# Patient Record
Sex: Male | Born: 1950
Health system: Southern US, Community
[De-identification: ages and names within clinical notes are randomized; demographics above are authoritative.]

## PROBLEM LIST (undated history)

## (undated) DIAGNOSIS — I2510 Atherosclerotic heart disease of native coronary artery without angina pectoris: Secondary | ICD-10-CM

## (undated) DIAGNOSIS — Z95810 Presence of automatic (implantable) cardiac defibrillator: Secondary | ICD-10-CM

## (undated) DIAGNOSIS — I3139 Other pericardial effusion (noninflammatory): Secondary | ICD-10-CM

## (undated) DIAGNOSIS — J189 Pneumonia, unspecified organism: Secondary | ICD-10-CM

## (undated) DIAGNOSIS — I4901 Ventricular fibrillation: Secondary | ICD-10-CM

## (undated) DIAGNOSIS — R0600 Dyspnea, unspecified: Secondary | ICD-10-CM

## (undated) DIAGNOSIS — N529 Male erectile dysfunction, unspecified: Secondary | ICD-10-CM

## (undated) DIAGNOSIS — Z720 Tobacco use: Secondary | ICD-10-CM

## (undated) DIAGNOSIS — I10 Essential (primary) hypertension: Secondary | ICD-10-CM

## (undated) DIAGNOSIS — I509 Heart failure, unspecified: Secondary | ICD-10-CM

## (undated) DIAGNOSIS — I472 Ventricular tachycardia: Secondary | ICD-10-CM

## (undated) DIAGNOSIS — G4730 Sleep apnea, unspecified: Secondary | ICD-10-CM

## (undated) DIAGNOSIS — I4510 Unspecified right bundle-branch block: Secondary | ICD-10-CM

## (undated) DIAGNOSIS — R7989 Other specified abnormal findings of blood chemistry: Secondary | ICD-10-CM

## (undated) DIAGNOSIS — I213 ST elevation (STEMI) myocardial infarction of unspecified site: Secondary | ICD-10-CM

## (undated) DIAGNOSIS — I241 Dressler's syndrome: Secondary | ICD-10-CM

## (undated) DIAGNOSIS — I255 Ischemic cardiomyopathy: Secondary | ICD-10-CM

## (undated) DIAGNOSIS — E785 Hyperlipidemia, unspecified: Secondary | ICD-10-CM

## (undated) DIAGNOSIS — I313 Pericardial effusion (noninflammatory): Secondary | ICD-10-CM

## (undated) DIAGNOSIS — I319 Disease of pericardium, unspecified: Secondary | ICD-10-CM

## (undated) HISTORY — DX: Dressler's syndrome: I24.1

## (undated) HISTORY — DX: Other pericardial effusion (noninflammatory): I31.39

## (undated) HISTORY — DX: Pericardial effusion (noninflammatory): I31.3

## (undated) HISTORY — PX: HAND SURGERY: SHX662

## (undated) HISTORY — DX: Male erectile dysfunction, unspecified: N52.9

## (undated) HISTORY — DX: Disease of pericardium, unspecified: I31.9

---

## 1980-12-08 HISTORY — PX: KNEE ARTHROSCOPY: SHX127

## 2001-06-03 ENCOUNTER — Other Ambulatory Visit: Admission: RE | Admit: 2001-06-03 | Discharge: 2001-06-03 | Payer: Self-pay | Admitting: Urology

## 2001-06-28 ENCOUNTER — Encounter: Admission: RE | Admit: 2001-06-28 | Discharge: 2001-09-26 | Payer: Self-pay | Admitting: Family Medicine

## 2014-11-10 DIAGNOSIS — I4901 Ventricular fibrillation: Secondary | ICD-10-CM

## 2014-11-10 DIAGNOSIS — I213 ST elevation (STEMI) myocardial infarction of unspecified site: Secondary | ICD-10-CM

## 2014-11-10 DIAGNOSIS — I2102 ST elevation (STEMI) myocardial infarction involving left anterior descending coronary artery: Principal | ICD-10-CM

## 2014-11-10 HISTORY — DX: ST elevation (STEMI) myocardial infarction of unspecified site: I21.3

## 2014-11-10 HISTORY — DX: Ventricular fibrillation: I49.01

## 2014-11-10 HISTORY — PX: CORONARY ANGIOPLASTY WITH STENT PLACEMENT: SHX49

## 2014-11-10 NOTE — ED Notes (Signed)
Wife took rings off of patients hands in prep for catheterization lab, also given clothing.

## 2014-11-10 NOTE — ED Provider Notes (Signed)
HPI Comments: [facility] is a 63 y.o. male who presents via EMS to [facility] c/o 6/10 mid-sternal CP x30-45 min PTA that he describes as a tightness. He states the pain has been gradually improving in the ED and notes the pain "felt like indigestion". He reports additional sxs of SOB, nausea, and diaphoresis. EMS reports the pt became unresponsive en route to ED after which he was shocked once into sinus bradycardia in the 20's and then paced, regaining consciousness. EMS reports they were unable to perform an EKG due to equipment failure. STEMI was called upon arrival to ED. Pt notes he smokes 1-2ppd. Pt denies Hx of MI, CABG, HTN, DM, and high cholesterol. He denies seeing a cardiologist and taking medications for heart disease.    PCP: None    PMHx: Significant for none  PSHx: Significant for none  Social Hx: +smoker (3-4LPF), -EtOH, -illicit drug use    There are no other changes, complaints, or physical findings at this time.  Written by [facility], ED Scribe, as dictated by [facility]. Todd Quint, MD.     The history is provided by the patient and the EMS personnel.        No past medical history on file.    No past surgical history on file.      No family history on file.    History     Social History   ??? Marital Status: MARRIED     Spouse Name: N/A     Number of Children: N/A   ??? Years of Education: N/A     Occupational History   ??? Not on file.     Social History Main Topics   ??? Smoking status: Not on file   ??? Smokeless tobacco: Not on file   ??? Alcohol Use: Not on file   ??? Drug Use: Not on file   ??? Sexual Activity: Not on file     Other Topics Concern   ??? Not on file     Social History Narrative   ??? No narrative on file                ALLERGIES: Review of patient's allergies indicates not on file.      Review of Systems   Constitutional: Positive for diaphoresis. Negative for fever and chills.   HENT: Negative.  Negative for congestion and sore throat.     Eyes: Negative.  Negative for discharge and redness.   Respiratory: Positive for shortness of breath. Negative for cough.    Cardiovascular: Positive for chest pain. Negative for palpitations.   Gastrointestinal: Positive for nausea. Negative for vomiting and abdominal pain.   Endocrine: Negative.  Negative for polydipsia and polyuria.   Genitourinary: Negative.  Negative for dysuria, frequency and flank pain.   Musculoskeletal: Negative.  Negative for back pain and arthralgias.   Skin: Negative.  Negative for rash and wound.   Allergic/Immunologic: Negative.    Neurological: Negative.  Negative for dizziness and headaches.   Hematological: Negative.  Negative for adenopathy. Does not bruise/bleed easily.   Psychiatric/Behavioral: Negative.  Negative for confusion. The patient is not nervous/anxious.    All other systems reviewed and are negative.      Patient Vitals for the past 12 hrs:   Pulse Resp BP SpO2   11/10/14 2319 100 - 140/90 mmHg -   11/10/14 2315 94 16 140/90 mmHg 95 %   11/10/14 2314 98 15 (!) 157/100 mmHg 96 %  11/10/14 2300 (!) 102 23 (!) 165/107 mmHg 98 %   11/10/14 2259 (!) 102 - (!) 161/110 mmHg -   11/10/14 2245 (!) 105 16 (!) 161/110 mmHg 98 %   11/10/14 2231 (!) 106 15 (!) 166/116 mmHg 97 %   11/10/14 2230 (!) 106 16 (!) 166/116 mmHg 97 %   11/10/14 2225 (!) 105 16 (!) 173/120 mmHg 97 %         Physical Exam   Constitutional: He is oriented to person, place, and time. He appears well-developed and well-nourished. No distress.   HENT:   Head: Normocephalic and atraumatic.   Mouth/Throat: Oropharynx is clear and moist.   Eyes: Conjunctivae and EOM are normal. Pupils are equal, round, and reactive to light. Right eye exhibits no discharge. Left eye exhibits no discharge.   Neck: Normal range of motion. Neck supple. No JVD present.   Cardiovascular: Normal rate, regular rhythm, normal heart sounds and intact distal pulses.  Exam reveals no friction rub.    No murmur heard.   Pulmonary/Chest: Effort normal and breath sounds normal. No respiratory distress. He has no wheezes. He has no rales.   Abdominal: Soft. Bowel sounds are normal. He exhibits no distension. There is no tenderness.   Musculoskeletal: Normal range of motion. He exhibits no edema or tenderness.   Neurological: He is alert and oriented to person, place, and time. He has normal strength. No cranial nerve deficit or sensory deficit. He exhibits normal muscle tone.   Skin: Skin is warm and dry. No rash noted.   Pale appearing   Psychiatric: He has a normal mood and affect. His behavior is normal.   Nursing note and vitals reviewed.       MDM  Number of Diagnoses or Management Options  ST elevation myocardial infarction (STEMI), unspecified artery [facility]):   VF (ventricular fibrillation) [facility]):   Diagnosis management comments: DDx: STEMI    STEMI with VF arrest, to cath lab       Amount and/or Complexity of Data Reviewed  Clinical lab tests: ordered and reviewed  Tests in the radiology section of CPT??: ordered and reviewed  Tests in the medicine section of CPT??: ordered and reviewed  Obtain history from someone other than the patient: yes (EMS)  Review and summarize past medical records: yes  Discuss the patient with other providers: yes (Cardiology)  Independent visualization of images, tracings, or specimens: yes    Patient Progress  Patient progress: stable      Procedures    [facility]line  1951/11/27    Time EMS pre-alert: 2218    Time STEMI called: 2221    Time arrived on Unit: 2221  Todd Murdoch, MD in room    Time EKG completed: 2224    Time EKG read by provider: 2224    Time cardiology/cath lab paged: 2224    Time cardiologist returned call: 2224    Time Cath Lab returned call:  Cath lab was with another pt when STEMI was called    Time Cardiologist arrived on Unit: 2224    Time Cath Lab arrived on Unit: 0005      Consult Note:  11:07 PM  [facility] spoke with [facility],  Specialty: Cardiology   Discussed pt???s hx, disposition, and available diagnostic and imaging results. Reviewed care plans. Consultant agrees with plan as outlined. Administer ASA, Heparin, Morphine, and Zofran  Written by [facility], ED Scribe, as dictated by Clela Hagadorn. Todd Quint, MD.  11:14 PM    I have reviewed all pertinent and currently available diagnostic test results for this visit including, but not limited to, labs, xrays, and EKGs. I have reviewed all pertinent and currently available medical records. My plan of care and further evaluation and/or disposition is based on these results, as well as the initial, and subsequent, history and physical exam, as well as any additional complaints during the visit.    Norina Buzzard, MD       EKG interpretation: (Preliminary)  Rhythm: normal sinus rhythm; and regular . Rate (approx.): 106; Blocks: none; Ectopy: none; Axis: normal; P wave: normal; QRS interval: normal ; ST/T wave: elevated ; in  Lead: ; Other findings: STEMI.  This EKG was interpreted by Norina Buzzard, MD ,ED Provider.        LABORATORY TESTS:  Recent Results (from the past 12 hour(s))   EKG, 12 LEAD, INITIAL    Collection Time: 11/10/14 10:24 PM   Result Value Ref Range    Ventricular Rate 106 BPM    Atrial Rate 106 BPM    P-R Interval 144 ms    QRS Duration 134 ms    Q-T Interval 370 ms    QTC Calculation (Bezet) 491 ms    Calculated P Axis 73 degrees    Calculated R Axis -14 degrees    Calculated T Axis 15 degrees    Diagnosis       Sinus tachycardia with occasional premature ventricular complexes  Possible Left atrial enlargement  Nonspecific intraventricular block  Cannot rule out Anteroseptal infarct , age undetermined  Lateral injury pattern  ** ** ACUTE MI / STEMI ** **  No previous ECGs available     CBC WITH AUTOMATED DIFF    Collection Time: 11/10/14 10:28 PM   Result Value Ref Range    WBC 15.1 (H) 4.1 - 11.1 K/uL    RBC 4.98 4.10 - 5.70 Tylyn Stankovich/uL    HGB 16.0 12.1 - 17.0 g/dL    HCT 45.6 36.6 - 50.3 %     MCV 91.6 80.0 - 99.0 FL    MCH 32.1 26.0 - 34.0 PG    MCHC 35.1 30.0 - 36.5 g/dL    RDW 13.0 11.5 - 14.5 %    PLATELET 304 150 - 400 K/uL    NEUTROPHILS 49 32 - 75 %    LYMPHOCYTES 37 12 - 49 %    MONOCYTES 10 5 - 13 %    EOSINOPHILS 3 0 - 7 %    BASOPHILS 1 0 - 1 %    ABS. NEUTROPHILS 7.3 1.8 - 8.0 K/UL    ABS. LYMPHOCYTES 5.6 (H) 0.8 - 3.5 K/UL    ABS. MONOCYTES 1.5 (H) 0.0 - 1.0 K/UL    ABS. EOSINOPHILS 0.5 (H) 0.0 - 0.4 K/UL    ABS. BASOPHILS 0.2 (H) 0.0 - 0.1 K/UL    RBC COMMENTS NORMOCYTIC, NORMOCHROMIC      WBC COMMENTS REACTIVE LYMPHS      DF SMEAR SCANNED     CK W/ CKMB & INDEX    Collection Time: 11/10/14 10:28 PM   Result Value Ref Range    CK 165 39 - 308 U/L    CK - MB 3.4 0.5 - 3.6 NG/ML    CK-MB Index 2.1 0 - 2.5     METABOLIC PANEL, COMPREHENSIVE    Collection Time: 11/10/14 10:28 PM   Result Value Ref Range    Sodium 138 136 -  145 mmol/L    Potassium 2.9 (L) 3.5 - 5.1 mmol/L    Chloride 102 97 - 108 mmol/L    CO2 19 (L) 21 - 32 mmol/L    Anion gap 17 (H) 5 - 15 mmol/L    Glucose 180 (H) 65 - 100 mg/dL    BUN 12 6 - 20 MG/DL    Creatinine 1.54 (H) 0.70 - 1.30 MG/DL    BUN/Creatinine ratio 8 (L) 12 - 20      GFR est AA 55 (L) >60 ml/min/1.37m    GFR est non-AA 46 (L) >60 ml/min/1.737m   Calcium 8.2 (L) 8.5 - 10.1 MG/DL    Bilirubin, total 0.3 0.2 - 1.0 MG/DL    ALT 39 12 - 78 U/L    AST 29 15 - 37 U/L    Alk. phosphatase 90 45 - 117 U/L    Protein, total 6.9 6.4 - 8.2 g/dL    Albumin 3.6 3.5 - 5.0 g/dL    Globulin 3.3 2.0 - 4.0 g/dL    A-G Ratio 1.1 1.1 - 2.2     TROPONIN I    Collection Time: 11/10/14 10:28 PM   Result Value Ref Range    Troponin-I, Qt. 0.17 (H) <0.05 ng/mL   PTT    Collection Time: 11/10/14 10:28 PM   Result Value Ref Range    aPTT 28.6 22.1 - 32.5 sec    aPTT, therapeutic range     58.0 - 77.0 SECS   PROTHROMBIN TIME + INR    Collection Time: 11/10/14 10:28 PM   Result Value Ref Range    INR 1.0 0.9 - 1.1      Prothrombin time 10.3 9.0 - 11.1 sec   PRO-BNP     Collection Time: 11/10/14 10:28 PM   Result Value Ref Range    NT pro-BNP 268 (H) 0 - 125 PG/ML   POC INR    Collection Time: 11/10/14 10:31 PM   Result Value Ref Range    INR (POC) 1.2 (H) <1.2     POC TROPONIN-I    Collection Time: 11/10/14 10:37 PM   Result Value Ref Range    Troponin-I (POC) 0.17 (H) 0.00 - 0.08 ng/mL         Imaging Results         XR CHEST PORT (Final result) Result time: 11/10/14 22:56:53    Final result by Rad Results In Edi (11/10/14 22:56:53)    Narrative:    **Final Report**      ICD Codes / Adm.Diagnosis: 410.90 427.41 / Acute myocardial infarction, u   Ventricular fibrillation (HE[facility] Surgery C[facility] LLCExamination: CR CHEST PORT - [id number] Nov 10 2014 10:41PM  Accession No: [id number]: Chest Pain      REPORT:  Indication: Chest pain    Comparison: None    Portable exam of the chest obtained at 2245 demonstrates normal heart size.   Mild pulmonary edema is noted. The osseous structures are unremarkable.      IMPRESSION: Mild pulmonary edema.          Signing/Reading Doctor: KAZachery [phone]  Approved: KAZachery [phone] 4 2015 10:54PM            MEDICATIONS GIVEN:  Medications   sodium chloride (NS) flush 5-10 mL (10 mL IntraVENous Given 11/10/14 2231)   sodium chloride (NS) flush 5-10 mL (not administered)   nitroglycerin (Tridil) 200 mcg/ml infusion (40 mcg/min IntraVENous Rate Change  11/10/14 2315)   morphine injection 4 mg ( IntraVENous Canceled Entry 11/10/14 2231)   fentaNYL citrate (PF) injection 25-50 mcg (not administered)   heparinized saline 2 units/mL infusion 1,000 Units (not administered)   heparinized saline 2 units/mL infusion 1,000 Units (not administered)   heparinized saline 2 units/mL infusion 1,000 Units (not administered)   iopamidol (ISOVUE-370) 76 % injection 0-300 mL (not administered)   lidocaine (PF) (XYLOCAINE) 10 mg/mL (1 %) injection 1-30 mL (not administered)   midazolam (VERSED) injection 0.5-2 mg (not administered)    nitroglycerin 100 mcg/ml compounded injection (not administered)   heparin (porcine) 1,000 unit/mL injection 2,500 Units (not administered)   verapamil (ISOPTIN) 2.5 mg/mL injection 2.5 mg (not administered)   morphine 4 mg/mL injection (  Canceled Entry 11/10/14 2307)   verapamil (ISOPTIN) 2.5 mg/mL injection (not administered)   midazolam (VERSED) 1 mg/mL injection (not administered)   fentaNYL citrate (PF) 50 mcg/mL injection (not administered)   lidocaine (PF) (XYLOCAINE) 10 mg/mL (1 %) injection (not administered)   heparin (porcine) 1,000 unit/mL injection (not administered)   iopamidol (ISOVUE-370) 76 % injection (not administered)   heparinized saline 2 units/mL 1,000 unit/500 mL infusion (not administered)   nitroglycerin 1 mg/82m injection (not administered)   metoprolol (LOPRESSOR) 5 mg/5 mL injection (  Canceled Entry 11/10/14 2322)   aspirin chewable tablet 243 mg (243 mg Oral Canceled Entry 11/10/14 2345)   morphine 2 mg/mL injection (not administered)   potassium chloride 10 mEq in 100 ml IVPB (not administered)   potassium chloride 10 mEq/100 mL IVPB premix pgbk (not administered)   aspirin chewable tablet 81 mg (81 mg Oral Given 11/10/14 2241)   heparin (porcine) injection 5,000 Units (5,000 Units IntraVENous Given 11/10/14 2240)   ondansetron (ZOFRAN) injection 4 mg (4 mg IntraVENous Given 11/10/14 2241)   sodium chloride 0.9 % bolus infusion 500 mL (0 mL IntraVENous IV Completed 11/10/14 2330)   amiodarone (CORDARONE) 150 mg in dextrose 5% 100 mL bolus infusion (0 mg IntraVENous IV Completed 11/10/14 2329)   morphine 4 mg/mL injection (4 mg  Given 11/10/14 2239)   morphine injection 4 mg (4 mg IntraVENous Given 11/10/14 2305)   metoprolol (LOPRESSOR) injection 5 mg (5 mg IntraVENous Given 11/10/14 2319)   morphine injection 2 mg (2 mg IntraVENous Given 11/10/14 2358)       IMPRESSION:  1. ST elevation myocardial infarction (STEMI), unspecified artery (HCC)    2. VF (ventricular fibrillation) ([place]         PLAN:  1. Admit     Admit Note:  12:48 AM  Patient is being admitted to the [facility] by [facility] The results of their tests and reasons for their admission have been discussed with the patient and/or available family. They convey agreement and understanding for the need to be admitted and for the admission diagnosis.  Written by KLawanna Cline ED Scribe, as dictated by MJerilynn Cline SDutch Quint MD.

## 2014-11-10 NOTE — H&P (Signed)
History & Physical     Subjective:      Date of  Admission: 11/10/2014 10:23 PM     Admission type:Emergency    [facility] is a 63 y.o. male admitted for STEMI/cardiac arrest. Patient presents via EMS to [facility] ED c/o 6/10 mid-sternal CP x30-45 min PTA that he describes as a tightness. He states the pain has been gradually improving in the ED and notes the pain "felt like indigestion". He reports additional sxs of SOB, nausea, and diaphoresis. EMS reports the pt developed ventricular fibrillation, unresponsive en route to ED after which he was shocked once into sinus bradycardia in the 20's and then paced, regaining consciousness. EMS reports they were unable to perform an EKG due to equipment failure . EKG in the ED revealed anterolateral ST elevation. Patient notes intermittent chest pains for last 1-2 years, does not see physicians.     Patient Active Problem List    Diagnosis Date Noted   ??? STEMI (ST elevation myocardial infarction) ([place]) 11/10/2014   ??? Ventricular fibrillation ([place]) 11/10/2014      None  History reviewed. No pertinent past medical history.   History reviewed. No pertinent past surgical history.  Not on File   History reviewed. No pertinent family history.   Current Facility-Administered Medications   Medication Dose Route Frequency   ??? potassium chloride 10 mEq/100 mL IVPB premix pgbk       ??? bivalirudin (ANGIOMAX) 250 mg injection       ??? 0.9% sodium chloride (MBP/ADV) 0.9 % infusion       ??? ADDaptor       ??? morphine 4 mg/mL injection       ??? verapamil (ISOPTIN) 2.5 mg/mL injection       ??? midazolam (VERSED) 1 mg/mL injection       ??? fentaNYL citrate (PF) 50 mcg/mL injection       ??? lidocaine (PF) (XYLOCAINE) 10 mg/mL (1 %) injection       ??? heparin (porcine) 1,000 unit/mL injection       ??? iopamidol (ISOVUE-370) 76 % injection       ??? heparinized saline 2 units/mL 1,000 unit/500 mL infusion       ??? nitroglycerin 1 mg/89mL injection        ??? metoprolol (LOPRESSOR) 5 mg/5 mL injection       ??? morphine 2 mg/mL injection       ??? fentaNYL citrate (PF) injection 25-50 mcg  25-50 mcg IntraVENous Multiple   ??? heparinized saline 2 units/mL infusion 1,000 Units  500 mL Irrigation ONCE   ??? heparinized saline 2 units/mL infusion 1,000 Units  500 mL Irrigation ONCE   ??? heparinized saline 2 units/mL infusion 1,000 Units  500 mL Irrigation ONCE   ??? iopamidol (ISOVUE-370) 76 % injection 0-300 mL  0-300 mL IntraVENous Multiple   ??? lidocaine (PF) (XYLOCAINE) 10 mg/mL (1 %) injection 1-30 mL  1-30 mL IntraDERMal Multiple   ??? midazolam (VERSED) injection 0.5-2 mg  0.5-2 mg IntraVENous Multiple   ??? nitroglycerin 100 mcg/ml compounded injection  2 mL IntraCORONary ONCE   ??? heparin (porcine) 1,000 unit/mL injection 2,500 Units  2,500 Units IntraVENous ONCE   ??? verapamil (ISOPTIN) 2.5 mg/mL injection 2.5 mg  2.5 mg IntraarTERial ONCE   ??? potassium chloride 10 mEq in 100 ml IVPB  10 mEq IntraVENous Q1H   ??? sodium chloride (NS) flush 5-10 mL  5-10 mL IntraVENous Q8H   ??? sodium chloride (NS)  flush 5-10 mL  5-10 mL IntraVENous PRN   ??? nitroglycerin (Tridil) 200 mcg/ml infusion  5 mcg/min IntraVENous TITRATE   ??? morphine injection 4 mg  4 mg IntraVENous NOW   ??? aspirin chewable tablet 243 mg  243 mg Oral NOW         Review of Symptoms:  A comprehensive review of systems was negative except for that written in the HPI.     Subjective:      Physical Exam    BP 140/90 mmHg   Pulse 100   Resp 16   Ht $R'6\' 5"'Px$  (1.956 m)   Wt 193 lb (87.544 kg)   BMI 22.88 kg/m2   SpO2 95%  General Appearance:  Well developed, well nourished,alert and oriented x 3, and individual in moderate distress.   Ears/Nose/Mouth/Throat:   Hearing grossly normal.         Neck: Supple.   Chest:   Lungs clear to auscultation bilaterally.   Cardiovascular:  Regular rate and rhythm, S1, S2 normal, no murmur.   Abdomen:   Soft, non-tender, bowel sounds are active.   Extremities: No edema bilaterally.     Skin: Warm and dry.               Cardiographics    Telemetry: normal sinus rhythm  ECG:  normal sinus rhythm,  ST elevation in anterolateral leads  Echocardiogram: Not done    Labs:   Recent Results (from the past 24 hour(s))   EKG, 12 LEAD, INITIAL    Collection Time: 11/10/14 10:24 PM   Result Value Ref Range    Ventricular Rate 106 BPM    Atrial Rate 106 BPM    P-R Interval 144 ms    QRS Duration 134 ms    Q-T Interval 370 ms    QTC Calculation (Bezet) 491 ms    Calculated P Axis 73 degrees    Calculated R Axis -14 degrees    Calculated T Axis 15 degrees    Diagnosis       Sinus tachycardia with occasional premature ventricular complexes  Possible Left atrial enlargement  Nonspecific intraventricular block  Cannot rule out Anteroseptal infarct , age undetermined  Lateral injury pattern  ** ** ACUTE MI / STEMI ** **  No previous ECGs available     CBC WITH AUTOMATED DIFF    Collection Time: 11/10/14 10:28 PM   Result Value Ref Range    WBC 15.1 (H) 4.1 - 11.1 K/uL    RBC 4.98 4.10 - 5.70 M/uL    HGB 16.0 12.1 - 17.0 g/dL    HCT 45.6 36.6 - 50.3 %    MCV 91.6 80.0 - 99.0 FL    MCH 32.1 26.0 - 34.0 PG    MCHC 35.1 30.0 - 36.5 g/dL    RDW 13.0 11.5 - 14.5 %    PLATELET 304 150 - 400 K/uL    NEUTROPHILS 49 32 - 75 %    LYMPHOCYTES 37 12 - 49 %    MONOCYTES 10 5 - 13 %    EOSINOPHILS 3 0 - 7 %    BASOPHILS 1 0 - 1 %    ABS. NEUTROPHILS 7.3 1.8 - 8.0 K/UL    ABS. LYMPHOCYTES 5.6 (H) 0.8 - 3.5 K/UL    ABS. MONOCYTES 1.5 (H) 0.0 - 1.0 K/UL    ABS. EOSINOPHILS 0.5 (H) 0.0 - 0.4 K/UL    ABS. BASOPHILS 0.2 (H) 0.0 - 0.1 K/UL  RBC COMMENTS NORMOCYTIC, NORMOCHROMIC      WBC COMMENTS REACTIVE LYMPHS      DF SMEAR SCANNED     CK W/ CKMB & INDEX    Collection Time: 11/10/14 10:28 PM   Result Value Ref Range    CK 165 39 - 308 U/L    CK - MB 3.4 0.5 - 3.6 NG/ML    CK-MB Index 2.1 0 - 2.5     METABOLIC PANEL, COMPREHENSIVE    Collection Time: 11/10/14 10:28 PM   Result Value Ref Range    Sodium 138 136 - 145 mmol/L     Potassium 2.9 (L) 3.5 - 5.1 mmol/L    Chloride 102 97 - 108 mmol/L    CO2 19 (L) 21 - 32 mmol/L    Anion gap 17 (H) 5 - 15 mmol/L    Glucose 180 (H) 65 - 100 mg/dL    BUN 12 6 - 20 MG/DL    Creatinine 1.54 (H) 0.70 - 1.30 MG/DL    BUN/Creatinine ratio 8 (L) 12 - 20      GFR est AA 55 (L) >60 ml/min/1.53m2    GFR est non-AA 46 (L) >60 ml/min/1.70m2    Calcium 8.2 (L) 8.5 - 10.1 MG/DL    Bilirubin, total 0.3 0.2 - 1.0 MG/DL    ALT 39 12 - 78 U/L    AST 29 15 - 37 U/L    Alk. phosphatase 90 45 - 117 U/L    Protein, total 6.9 6.4 - 8.2 g/dL    Albumin 3.6 3.5 - 5.0 g/dL    Globulin 3.3 2.0 - 4.0 g/dL    A-G Ratio 1.1 1.1 - 2.2     TROPONIN I    Collection Time: 11/10/14 10:28 PM   Result Value Ref Range    Troponin-I, Qt. 0.17 (H) <0.05 ng/mL   PTT    Collection Time: 11/10/14 10:28 PM   Result Value Ref Range    aPTT 28.6 22.1 - 32.5 sec    aPTT, therapeutic range     58.0 - 77.0 SECS   PROTHROMBIN TIME + INR    Collection Time: 11/10/14 10:28 PM   Result Value Ref Range    INR 1.0 0.9 - 1.1      Prothrombin time 10.3 9.0 - 11.1 sec   PRO-BNP    Collection Time: 11/10/14 10:28 PM   Result Value Ref Range    NT pro-BNP 268 (H) 0 - 125 PG/ML   POC INR    Collection Time: 11/10/14 10:31 PM   Result Value Ref Range    INR (POC) 1.2 (H) <1.2     POC TROPONIN-I    Collection Time: 11/10/14 10:37 PM   Result Value Ref Range    Troponin-I (POC) 0.17 (H) 0.00 - 0.08 ng/mL        Assessment:     Assessment:       Principal Problem:    STEMI (ST elevation myocardial infarction) ([place]) (11/10/2014)    Active Problems:    Ventricular fibrillation ([place]) (11/10/2014)         Plan:     Principal Problem:    STEMI (ST elevation myocardial infarction) ([place]) (11/10/2014)-emergent cath/PCI. There was a delay due to another STEMI at the same time. Discussed with patient and wife. ASA, heparin, betablocker, brilinta.    Active Problems:    Ventricular fibrillation ([place]) (11/10/2014)        [id number] Ramond Dial, MD

## 2014-11-10 NOTE — ED Notes (Signed)
Patient arrived via EMS. Per EMS, patient called 911 with c/o chest tightness. When they arrived they patient exited the house and had a witness arrest, unresponsive. EMS placed him on the monitor and patient was in a shockable rhythm, shocked x 1, patient went into sinus bradycardia with a pulse. EMS paced the patient enroute to ED. Patient denies cardiac history. Received aspirin prior to EMS arrival. Patient states he smokes 1-2 ppd.

## 2014-11-11 ENCOUNTER — Inpatient Hospital Stay
Admit: 2014-11-11 | Discharge: 2014-11-15 | Disposition: A | Discharge status: Home / Self Care | Payer: BLUE CROSS/BLUE SHIELD | Attending: Cardiovascular Disease | Admitting: Cardiovascular Disease

## 2014-11-11 LAB — ECG RHYTHM ANALYSIS ADULT
Atrial Rate: 58 {beats}/min
Calculated P Axis: -21 degrees
Calculated R Axis: 110 degrees
Calculated T Axis: 101 degrees
P-R Interval: 158 ms
Q-T Interval: 488 ms
QRS Duration: 94 ms
QTC Calculation (Bezet): 479 ms
Ventricular Rate: 58 {beats}/min

## 2014-11-11 LAB — EKG, 12 LEAD, SUBSEQUENT
Atrial Rate: 77 {beats}/min
Atrial Rate: 76 {beats}/min
Calculated P Axis: 74 degrees
Calculated P Axis: 72 degrees
Calculated R Axis: 72 degrees
Calculated R Axis: 81 degrees
Calculated T Axis: 57 degrees
Calculated T Axis: 15 degrees
Diagnosis: NORMAL
P-R Interval: 152 ms
P-R Interval: 158 ms
Q-T Interval: 404 ms
Q-T Interval: 410 ms
QRS Duration: 122 ms
QRS Duration: 116 ms
QTC Calculation (Bezet): 457 ms
QTC Calculation (Bezet): 461 ms
Ventricular Rate: 77 {beats}/min
Ventricular Rate: 76 {beats}/min

## 2014-11-11 LAB — METABOLIC PANEL, COMPREHENSIVE
A-G Ratio: 1.1 (ref 1.1–2.2)
ALT (SGPT): 39 U/L (ref 12–78)
AST (SGOT): 29 U/L (ref 15–37)
Albumin: 3.6 g/dL (ref 3.5–5.0)
Alk. phosphatase: 90 U/L (ref 45–117)
Anion gap: 17 mmol/L — ABNORMAL HIGH (ref 5–15)
BUN/Creatinine ratio: 8 — ABNORMAL LOW (ref 12–20)
BUN: 12 MG/DL (ref 6–20)
Bilirubin, total: 0.3 MG/DL (ref 0.2–1.0)
CO2: 19 mmol/L — ABNORMAL LOW (ref 21–32)
Calcium: 8.2 MG/DL — ABNORMAL LOW (ref 8.5–10.1)
Chloride: 102 mmol/L (ref 97–108)
Creatinine: 1.54 MG/DL — ABNORMAL HIGH (ref 0.70–1.30)
GFR est AA: 55 mL/min/{1.73_m2} — ABNORMAL LOW (ref >60)
GFR est non-AA: 46 mL/min/{1.73_m2} — ABNORMAL LOW (ref >60)
Globulin: 3.3 g/dL (ref 2.0–4.0)
Glucose: 180 mg/dL — ABNORMAL HIGH (ref 65–100)
Potassium: 2.9 mmol/L — ABNORMAL LOW (ref 3.5–5.1)
Protein, total: 6.9 g/dL (ref 6.4–8.2)
Sodium: 138 mmol/L (ref 136–145)

## 2014-11-11 LAB — METABOLIC PANEL, BASIC
Anion gap: 9 mmol/L (ref 5–15)
BUN/Creatinine ratio: 10 — ABNORMAL LOW (ref 12–20)
BUN: 13 MG/DL (ref 6–20)
CO2: 23 mmol/L (ref 21–32)
Calcium: 8 MG/DL — ABNORMAL LOW (ref 8.5–10.1)
Chloride: 103 mmol/L (ref 97–108)
Creatinine: 1.24 MG/DL (ref 0.70–1.30)
GFR est AA: >60 mL/min/{1.73_m2} (ref >60)
GFR est non-AA: 59 mL/min/{1.73_m2} — ABNORMAL LOW (ref >60)
Glucose: 167 mg/dL — ABNORMAL HIGH (ref 65–100)
Potassium: 5.2 mmol/L — ABNORMAL HIGH (ref 3.5–5.1)
Sodium: 135 mmol/L — ABNORMAL LOW (ref 136–145)

## 2014-11-11 LAB — CBC WITH AUTOMATED DIFF
ABS. BASOPHILS: 0.2 10*3/uL — ABNORMAL HIGH (ref 0.0–0.1)
ABS. EOSINOPHILS: 0.5 10*3/uL — ABNORMAL HIGH (ref 0.0–0.4)
ABS. LYMPHOCYTES: 5.6 10*3/uL — ABNORMAL HIGH (ref 0.8–3.5)
ABS. MONOCYTES: 1.5 10*3/uL — ABNORMAL HIGH (ref 0.0–1.0)
ABS. NEUTROPHILS: 7.3 10*3/uL (ref 1.8–8.0)
BASOPHILS: 1 % (ref 0–1)
EOSINOPHILS: 3 % (ref 0–7)
HCT: 45.6 % (ref 36.6–50.3)
HGB: 16 g/dL (ref 12.1–17.0)
LYMPHOCYTES: 37 % (ref 12–49)
MCH: 32.1 PG (ref 26.0–34.0)
MCHC: 35.1 g/dL (ref 30.0–36.5)
MCV: 91.6 FL (ref 80.0–99.0)
MONOCYTES: 10 % (ref 5–13)
NEUTROPHILS: 49 % (ref 32–75)
PLATELET: 304 10*3/uL (ref 150–400)
RBC: 4.98 M/uL (ref 4.10–5.70)
RDW: 13 % (ref 11.5–14.5)
WBC COMMENTS: REACTIVE
WBC: 15.1 10*3/uL — ABNORMAL HIGH (ref 4.1–11.1)

## 2014-11-11 LAB — EKG, 12 LEAD, INITIAL
Atrial Rate: 106 {beats}/min
Calculated P Axis: 73 degrees
Calculated R Axis: -14 degrees
Calculated T Axis: 15 degrees
P-R Interval: 144 ms
Q-T Interval: 370 ms
QRS Duration: 134 ms
QTC Calculation (Bezet): 491 ms
Ventricular Rate: 106 {beats}/min

## 2014-11-11 LAB — LIPID PANEL
CHOL/HDL Ratio: 3.4 (ref 0–5.0)
Cholesterol, total: 164 MG/DL (ref <200)
HDL Cholesterol: 48 MG/DL
LDL, calculated: 59.2 MG/DL (ref 0–100)
Triglyceride: 284 MG/DL — ABNORMAL HIGH (ref <150)
VLDL, calculated: 56.8 MG/DL

## 2014-11-11 LAB — PTT: aPTT: 28.6 s (ref 22.1–32.5)

## 2014-11-11 LAB — CK W/ CKMB & INDEX
CK - MB: 3.4 NG/ML (ref 0.5–3.6)
CK - MB: >600 NG/ML — ABNORMAL HIGH (ref 0.5–3.6)
CK - MB: >600 NG/ML — ABNORMAL HIGH (ref 0.5–3.6)
CK-MB Index: 2.1 (ref 0–2.5)
CK: 165 U/L (ref 39–308)
CK: 5013 U/L — ABNORMAL HIGH (ref 39–308)
CK: 4546 U/L — ABNORMAL HIGH (ref 39–308)

## 2014-11-11 LAB — TROPONIN I
Troponin-I, Qt.: 0.17 ng/mL — ABNORMAL HIGH (ref <0.05)
Troponin-I, Qt.: >100 ng/mL — ABNORMAL HIGH (ref <0.05)
Troponin-I, Qt.: >100 ng/mL — ABNORMAL HIGH (ref <0.05)

## 2014-11-11 LAB — POC INR: INR (POC): 1.2 — ABNORMAL HIGH (ref <1.2)

## 2014-11-11 LAB — PROTHROMBIN TIME + INR
INR: 1 (ref 0.9–1.1)
Prothrombin time: 10.3 s (ref 9.0–11.1)

## 2014-11-11 LAB — POC TROPONIN-I: Troponin-I (POC): 0.17 ng/mL — ABNORMAL HIGH (ref 0.00–0.08)

## 2014-11-11 LAB — NT-PRO BNP: NT pro-BNP: 268 PG/ML — ABNORMAL HIGH (ref 0–125)

## 2014-11-11 MED ORDER — METOPROLOL TARTRATE 25 MG TAB
25 mg | Freq: Two times a day (BID) | ORAL | Status: DC
Start: 2014-11-11 — End: 2014-11-11
  Administered 2014-11-11: 14:00:00 via ORAL

## 2014-11-11 MED ORDER — HEPARIN (PORCINE) IN NS (PF) 1,000 UNIT/500 ML IV
1000 unit/500 mL | Freq: Once | INTRAVENOUS | Status: AC
Start: 2014-11-11 — End: 2014-11-11

## 2014-11-11 MED ORDER — ATORVASTATIN 40 MG TAB
40 mg | Freq: Every evening | ORAL | Status: DC
Start: 2014-11-11 — End: 2014-11-14
  Administered 2014-11-12 – 2014-11-14 (×3): via ORAL

## 2014-11-11 MED ORDER — MORPHINE 4 MG/ML SYRINGE
4 mg/mL | Freq: Once | INTRAMUSCULAR | Status: AC
Start: 2014-11-11 — End: 2014-11-10
  Administered 2014-11-11: 04:00:00 via INTRAVENOUS

## 2014-11-11 MED ORDER — LISINOPRIL 5 MG TAB
5 mg | Freq: Every day | ORAL | Status: DC
Start: 2014-11-11 — End: 2014-11-14
  Administered 2014-11-11 – 2014-11-14 (×4): via ORAL

## 2014-11-11 MED ORDER — SODIUM CHLORIDE 0.9 % IV PIGGY BACK
INTRAVENOUS | Status: DC
Start: 2014-11-11 — End: 2014-11-12

## 2014-11-11 MED ORDER — ASPIRIN 81 MG TAB, DELAYED RELEASE
81 mg | Freq: Every day | ORAL | Status: DC
Start: 2014-11-11 — End: 2014-11-15
  Administered 2014-11-11 – 2014-11-15 (×5): via ORAL

## 2014-11-11 MED ORDER — FENTANYL CITRATE (PF) 50 MCG/ML IJ SOLN
50 mcg/mL | INTRAMUSCULAR | Status: DC | PRN
Start: 2014-11-11 — End: 2014-11-11

## 2014-11-11 MED ORDER — TICAGRELOR 90 MG TAB
90 mg | ORAL | Status: DC
Start: 2014-11-11 — End: 2014-11-12

## 2014-11-11 MED ORDER — ADV ADDAPTOR
Status: DC
Start: 2014-11-11 — End: 2014-11-12

## 2014-11-11 MED ORDER — POTASSIUM CHLORIDE SR 10 MEQ TAB
10 mEq | ORAL | Status: DC
Start: 2014-11-11 — End: 2014-11-11

## 2014-11-11 MED ORDER — AMIODARONE 50 MG/ML IV SOLN
50 mg/mL | INTRAVENOUS | Status: AC
Start: 2014-11-11 — End: 2014-11-10
  Administered 2014-11-11: 04:00:00 via INTRAVENOUS

## 2014-11-11 MED ORDER — METOPROLOL TARTRATE 5 MG/5 ML IV SOLN
5 mg/ mL | INTRAVENOUS | Status: AC
Start: 2014-11-11 — End: 2014-11-10
  Administered 2014-11-11: 04:00:00 via INTRAVENOUS

## 2014-11-11 MED ORDER — MORPHINE 4 MG/ML SYRINGE
4 mg/mL | INTRAMUSCULAR | Status: AC
Start: 2014-11-11 — End: 2014-11-10
  Administered 2014-11-11: 04:00:00

## 2014-11-11 MED ORDER — HEPARIN (PORCINE) IN NS (PF) 1,000 UNIT/500 ML IV
1000 unit/500 mL | Freq: Once | INTRAVENOUS | Status: AC
Start: 2014-11-11 — End: 2014-11-11
  Administered 2014-11-11: 06:00:00

## 2014-11-11 MED ORDER — TICAGRELOR 90 MG TAB
90 mg | Freq: Two times a day (BID) | ORAL | Status: DC
Start: 2014-11-11 — End: 2014-11-15
  Administered 2014-11-11 – 2014-11-15 (×8): via ORAL

## 2014-11-11 MED ORDER — NITROGLYCERIN 0.1 MG/ML (100 MCG/ML) D5W COMPOUNDED INJECTION
0.1 mg/mL | Freq: Once | INTRAVENOUS | Status: AC
Start: 2014-11-11 — End: 2014-11-11

## 2014-11-11 MED ORDER — POTASSIUM CHLORIDE 10 MEQ/100 ML IV PIGGY BACK
10 mEq/0 mL | INTRAVENOUS | Status: AC
Start: 2014-11-11 — End: 2014-11-11
  Administered 2014-11-11: 09:00:00

## 2014-11-11 MED ORDER — MORPHINE 2 MG/ML INJECTION
2 mg/mL | INTRAMUSCULAR | Status: AC
Start: 2014-11-11 — End: 2014-11-10
  Administered 2014-11-11: 05:00:00 via INTRAVENOUS

## 2014-11-11 MED ORDER — MUPIROCIN 2 % OINTMENT
2 % | Freq: Two times a day (BID) | CUTANEOUS | Status: DC
Start: 2014-11-11 — End: 2014-11-15
  Administered 2014-11-11 – 2014-11-14 (×5): via NASAL

## 2014-11-11 MED ORDER — LIDOCAINE (PF) 10 MG/ML (1 %) IJ SOLN
10 mg/mL (1 %) | INTRAMUSCULAR | Status: DC | PRN
Start: 2014-11-11 — End: 2014-11-11

## 2014-11-11 MED ORDER — FENTANYL CITRATE (PF) 50 MCG/ML IJ SOLN
50 mcg/mL | INTRAMUSCULAR | Status: AC
Start: 2014-11-11 — End: 2014-11-11
  Administered 2014-11-11: 06:00:00 via INTRAVENOUS

## 2014-11-11 MED ORDER — ASPIRIN 81 MG CHEWABLE TAB
81 mg | ORAL | Status: AC
Start: 2014-11-11 — End: 2014-11-11
  Administered 2014-11-11: 05:00:00 via ORAL

## 2014-11-11 MED ORDER — CARVEDILOL 3.125 MG TAB
3.125 mg | Freq: Two times a day (BID) | ORAL | Status: DC
Start: 2014-11-11 — End: 2014-11-12
  Administered 2014-11-11 – 2014-11-12 (×2): via ORAL

## 2014-11-11 MED ORDER — POTASSIUM CHLORIDE SR 10 MEQ TAB
10 mEq | ORAL | Status: AC
Start: 2014-11-11 — End: 2014-11-11
  Administered 2014-11-11: 10:00:00 via ORAL

## 2014-11-11 MED ORDER — SODIUM CHLORIDE 0.9 % IV
INTRAVENOUS | Status: DC
Start: 2014-11-11 — End: 2014-11-11
  Administered 2014-11-11: 07:00:00 via INTRAVENOUS

## 2014-11-11 MED ORDER — SODIUM CHLORIDE 0.9 % IJ SYRG
INTRAMUSCULAR | Status: DC | PRN
Start: 2014-11-11 — End: 2014-11-14

## 2014-11-11 MED ORDER — VERAPAMIL 2.5 MG/ML IV
2.5 mg/mL | INTRAVENOUS | Status: AC
Start: 2014-11-11 — End: 2014-11-11
  Administered 2014-11-11: 06:00:00 via INTRA_ARTERIAL

## 2014-11-11 MED ORDER — NITROGLYCERIN IN D5W 200 MCG/ML IV
50 mg/2 mL (200 mcg/mL) | INTRAVENOUS | Status: DC
Start: 2014-11-11 — End: 2014-11-14
  Administered 2014-11-11 – 2014-11-13 (×23): via INTRAVENOUS

## 2014-11-11 MED ORDER — HEPARIN (PORCINE) 1,000 UNIT/ML IJ SOLN
1000 unit/mL | INTRAMUSCULAR | Status: AC
Start: 2014-11-11 — End: 2014-11-11
  Administered 2014-11-11: 06:00:00 via INTRAVENOUS

## 2014-11-11 MED ORDER — SODIUM CHLORIDE 0.9 % IJ SYRG
Freq: Three times a day (TID) | INTRAMUSCULAR | Status: DC
Start: 2014-11-11 — End: 2014-11-14
  Administered 2014-11-11 – 2014-11-14 (×12): via INTRAVENOUS

## 2014-11-11 MED ORDER — METOPROLOL TARTRATE 5 MG/5 ML IV SOLN
5 mg/ mL | INTRAVENOUS | Status: AC
Start: 2014-11-11 — End: 2014-11-11
  Administered 2014-11-11: 04:00:00

## 2014-11-11 MED ORDER — TICAGRELOR 90 MG TAB
90 mg | Freq: Two times a day (BID) | ORAL | Status: DC
Start: 2014-11-11 — End: 2014-11-11

## 2014-11-11 MED ORDER — SODIUM CHLORIDE 0.9 % IJ SYRG
Freq: Three times a day (TID) | INTRAMUSCULAR | Status: DC
Start: 2014-11-11 — End: 2014-11-15
  Administered 2014-11-11 – 2014-11-15 (×14): via INTRAVENOUS

## 2014-11-11 MED ORDER — MORPHINE 2 MG/ML INJECTION
2 mg/mL | INTRAMUSCULAR | Status: AC
Start: 2014-11-11 — End: 2014-11-11

## 2014-11-11 MED ORDER — TICAGRELOR 90 MG TAB
90 mg | ORAL | Status: DC
Start: 2014-11-11 — End: 2014-11-11

## 2014-11-11 MED ORDER — IOPAMIDOL 76 % IV SOLN
370 mg iodine /mL (76 %) | INTRAVENOUS | Status: DC
Start: 2014-11-11 — End: 2014-11-12

## 2014-11-11 MED ORDER — HEPARIN (PORCINE) IN NS (PF) 1,000 UNIT/500 ML IV
1000 unit/500 mL | INTRAVENOUS | Status: AC
Start: 2014-11-11 — End: 2014-11-11
  Administered 2014-11-11: 06:00:00

## 2014-11-11 MED ORDER — ONDANSETRON (PF) 4 MG/2 ML INJECTION
4 mg/2 mL | INTRAMUSCULAR | Status: AC
Start: 2014-11-11 — End: 2014-11-10
  Administered 2014-11-11: 04:00:00 via INTRAVENOUS

## 2014-11-11 MED ORDER — FUROSEMIDE 10 MG/ML IJ SOLN
10 mg/mL | INTRAMUSCULAR | Status: AC
Start: 2014-11-11 — End: 2014-11-11
  Administered 2014-11-11: 14:00:00 via INTRAVENOUS

## 2014-11-11 MED ORDER — BIVALIRUDIN 250 MG SOLUTION
250 mg | INTRAVENOUS | Status: DC
Start: 2014-11-11 — End: 2014-11-12

## 2014-11-11 MED ORDER — FUROSEMIDE 40 MG TAB
40 mg | Freq: Every day | ORAL | Status: DC
Start: 2014-11-11 — End: 2014-11-15
  Administered 2014-11-12 – 2014-11-15 (×4): via ORAL

## 2014-11-11 MED ORDER — ASPIRIN 81 MG CHEWABLE TAB
81 mg | ORAL | Status: AC
Start: 2014-11-11 — End: 2014-11-10
  Administered 2014-11-11: 04:00:00 via ORAL

## 2014-11-11 MED ORDER — SODIUM CHLORIDE 0.9% BOLUS IV
0.9 % | Freq: Once | INTRAVENOUS | Status: AC
Start: 2014-11-11 — End: 2014-11-10
  Administered 2014-11-11: 04:00:00 via INTRAVENOUS

## 2014-11-11 MED ORDER — IOPAMIDOL 76 % IV SOLN
370 mg iodine /mL (76 %) | INTRAVENOUS | Status: DC | PRN
Start: 2014-11-11 — End: 2014-11-11
  Administered 2014-11-11 (×2): via INTRAVENOUS

## 2014-11-11 MED ORDER — HEPARIN (PORCINE) IN D5W 25,000 UNIT/250 ML IV
25000 unit/250 mL(100 unit/mL) | INTRAVENOUS | Status: DC
Start: 2014-11-11 — End: 2014-11-10

## 2014-11-11 MED ORDER — POTASSIUM CHLORIDE 10 MEQ/100 ML IV PIGGY BACK
10 mEq/0 mL | INTRAVENOUS | Status: AC
Start: 2014-11-11 — End: 2014-11-11
  Administered 2014-11-11: 06:00:00 via INTRAVENOUS

## 2014-11-11 MED ORDER — IOPAMIDOL 76 % IV SOLN
370 mg iodine /mL (76 %) | INTRAVENOUS | Status: AC
Start: 2014-11-11 — End: 2014-11-11
  Administered 2014-11-11: 06:00:00 via INTRAVENOUS

## 2014-11-11 MED ORDER — TICAGRELOR 90 MG TAB
90 mg | ORAL | Status: AC
Start: 2014-11-11 — End: 2014-11-11
  Administered 2014-11-11: 07:00:00 via ORAL

## 2014-11-11 MED ORDER — NITROGLYCERIN 0.1 MG/ML (100 MCG/ML) D5W COMPOUNDED INJECTION
0.1 mg/mL | INTRAVENOUS | Status: AC
Start: 2014-11-11 — End: 2014-11-11
  Administered 2014-11-11: 06:00:00 via INTRACORONARY

## 2014-11-11 MED ORDER — HEPARIN (PORCINE) 1,000 UNIT/ML IJ SOLN
1000 unit/mL | Freq: Once | INTRAMUSCULAR | Status: AC
Start: 2014-11-11 — End: 2014-11-11

## 2014-11-11 MED ORDER — PNEUMOCOCCAL 23-VALPS VACCINE 25 MCG/0.5 ML INJECTION
25 mcg/0.5 mL | INTRAMUSCULAR | Status: DC
Start: 2014-11-11 — End: 2014-11-13

## 2014-11-11 MED ORDER — MORPHINE 4 MG/ML SYRINGE
4 mg/mL | INTRAMUSCULAR | Status: AC
Start: 2014-11-11 — End: 2014-11-11
  Administered 2014-11-11: 04:00:00

## 2014-11-11 MED ORDER — POTASSIUM CHLORIDE 10 MEQ/100 ML IV PIGGY BACK
10 mEq/0 mL | INTRAVENOUS | Status: DC
Start: 2014-11-11 — End: 2014-11-11

## 2014-11-11 MED ORDER — HEPARIN (PORCINE) 5,000 UNIT/ML IJ SOLN
5000 unit/mL | INTRAMUSCULAR | Status: AC
Start: 2014-11-11 — End: 2014-11-10
  Administered 2014-11-11: 04:00:00 via INTRAVENOUS

## 2014-11-11 MED ORDER — BIVALIRUDIN 250 MG SOLUTION
250 mg | INTRAVENOUS | Status: AC
Start: 2014-11-11 — End: 2014-11-11
  Administered 2014-11-11 (×3): via INTRAVENOUS

## 2014-11-11 MED ORDER — VERAPAMIL 2.5 MG/ML IV
2.5 mg/mL | Freq: Once | INTRAVENOUS | Status: AC
Start: 2014-11-11 — End: 2014-11-11

## 2014-11-11 MED ORDER — LIDOCAINE (PF) 10 MG/ML (1 %) IJ SOLN
10 mg/mL (1 %) | INTRAMUSCULAR | Status: AC
Start: 2014-11-11 — End: 2014-11-11
  Administered 2014-11-11: 06:00:00 via INTRADERMAL

## 2014-11-11 MED ORDER — MORPHINE 2 MG/ML INJECTION
2 mg/mL | INTRAMUSCULAR | Status: AC
Start: 2014-11-11 — End: 2014-11-11
  Administered 2014-11-11: 04:00:00 via INTRAVENOUS

## 2014-11-11 MED ORDER — MIDAZOLAM 1 MG/ML IJ SOLN
1 mg/mL | INTRAMUSCULAR | Status: DC | PRN
Start: 2014-11-11 — End: 2014-11-11

## 2014-11-11 MED ORDER — MIDAZOLAM 1 MG/ML IJ SOLN
1 mg/mL | INTRAMUSCULAR | Status: AC
Start: 2014-11-11 — End: 2014-11-11
  Administered 2014-11-11: 06:00:00 via INTRAVENOUS

## 2014-11-11 MED ORDER — POTASSIUM CHLORIDE 10 MEQ/100 ML IV PIGGY BACK
10 mEq/0 mL | INTRAVENOUS | Status: AC
Start: 2014-11-11 — End: 2014-11-11
  Administered 2014-11-11 (×2): via INTRAVENOUS

## 2014-11-11 MED FILL — XYLOCAINE-MPF 10 MG/ML (1 %) INJECTION SOLUTION: 10 mg/mL (1 %) | INTRAMUSCULAR | Qty: 2

## 2014-11-11 MED FILL — NITROGLYCERIN 0.1 MG/ML (100 MCG/ML) D5W COMPOUNDED INJECTION: 0.1 mg/mL | INTRAVENOUS | Qty: 10

## 2014-11-11 MED FILL — POTASSIUM CHLORIDE 10 MEQ/100 ML IV PIGGY BACK: 10 mEq/0 mL | INTRAVENOUS | Qty: 100

## 2014-11-11 MED FILL — MORPHINE 4 MG/ML SYRINGE: 4 mg/mL | INTRAMUSCULAR | Qty: 1

## 2014-11-11 MED FILL — SODIUM CHLORIDE 0.9 % IV PIGGY BACK: INTRAVENOUS | Qty: 50

## 2014-11-11 MED FILL — BD POSIFLUSH NORMAL SALINE 0.9 % INJECTION SYRINGE: INTRAMUSCULAR | Qty: 10

## 2014-11-11 MED FILL — BRILINTA 90 MG TABLET: 90 mg | ORAL | Qty: 2

## 2014-11-11 MED FILL — SODIUM CHLORIDE 0.9 % IV: INTRAVENOUS | Qty: 500

## 2014-11-11 MED FILL — METOPROLOL TARTRATE 25 MG TAB: 25 mg | ORAL | Qty: 1

## 2014-11-11 MED FILL — HEPARIN (PORCINE) 5,000 UNIT/ML IJ SOLN: 5000 unit/mL | INTRAMUSCULAR | Qty: 1

## 2014-11-11 MED FILL — CHILDREN'S ASPIRIN 81 MG CHEWABLE TABLET: 81 mg | ORAL | Qty: 3

## 2014-11-11 MED FILL — MORPHINE 2 MG/ML INJECTION: 2 mg/mL | INTRAMUSCULAR | Qty: 1

## 2014-11-11 MED FILL — ANGIOMAX 250 MG INTRAVENOUS POWDER FOR SOLUTION: 250 mg | INTRAVENOUS | Qty: 5

## 2014-11-11 MED FILL — HEPARIN (PORCINE) IN D5W 25,000 UNIT/250 ML IV: 25000 unit/250 mL(100 unit/mL) | INTRAVENOUS | Qty: 250

## 2014-11-11 MED FILL — MIDAZOLAM 1 MG/ML IJ SOLN: 1 mg/mL | INTRAMUSCULAR | Qty: 2

## 2014-11-11 MED FILL — ADV ADDAPTOR: Qty: 1

## 2014-11-11 MED FILL — NITROGLYCERIN IN D5W 200 MCG/ML IV: 50 mg/2 mL (200 mcg/mL) | INTRAVENOUS | Qty: 250

## 2014-11-11 MED FILL — METOPROLOL TARTRATE 5 MG/5 ML IV SOLN: 5 mg/ mL | INTRAVENOUS | Qty: 5

## 2014-11-11 MED FILL — ASPIRIN 81 MG TAB, DELAYED RELEASE: 81 mg | ORAL | Qty: 1

## 2014-11-11 MED FILL — SODIUM CHLORIDE 0.9 % IV: INTRAVENOUS | Qty: 1000

## 2014-11-11 MED FILL — LISINOPRIL 5 MG TAB: 5 mg | ORAL | Qty: 1

## 2014-11-11 MED FILL — CARVEDILOL 3.125 MG TAB: 3.125 mg | ORAL | Qty: 2

## 2014-11-11 MED FILL — HEPARIN (PORCINE) 1,000 UNIT/ML IJ SOLN: 1000 unit/mL | INTRAMUSCULAR | Qty: 10

## 2014-11-11 MED FILL — ISOVUE-370  76 % INTRAVENOUS SOLUTION: 370 mg iodine /mL (76 %) | INTRAVENOUS | Qty: 200

## 2014-11-11 MED FILL — VERAPAMIL 2.5 MG/ML IV: 2.5 mg/mL | INTRAVENOUS | Qty: 2

## 2014-11-11 MED FILL — CHILDREN'S ASPIRIN 81 MG CHEWABLE TABLET: 81 mg | ORAL | Qty: 1

## 2014-11-11 MED FILL — FENTANYL CITRATE (PF) 50 MCG/ML IJ SOLN: 50 mcg/mL | INTRAMUSCULAR | Qty: 2

## 2014-11-11 MED FILL — ISOVUE-370  76 % INTRAVENOUS SOLUTION: 370 mg iodine /mL (76 %) | INTRAVENOUS | Qty: 300

## 2014-11-11 MED FILL — FUROSEMIDE 10 MG/ML IJ SOLN: 10 mg/mL | INTRAMUSCULAR | Qty: 4

## 2014-11-11 MED FILL — BRILINTA 90 MG TABLET: 90 mg | ORAL | Qty: 1

## 2014-11-11 MED FILL — MUPIROCIN 2 % OINTMENT: 2 % | CUTANEOUS | Qty: 22

## 2014-11-11 MED FILL — ONDANSETRON (PF) 4 MG/2 ML INJECTION: 4 mg/2 mL | INTRAMUSCULAR | Qty: 2

## 2014-11-11 MED FILL — AMIODARONE 50 MG/ML IV SOLN: 50 mg/mL | INTRAVENOUS | Qty: 3

## 2014-11-11 MED FILL — HEPARIN (PORCINE) IN NS (PF) 1,000 UNIT/500 ML IV: 1000 unit/500 mL | INTRAVENOUS | Qty: 1500

## 2014-11-11 NOTE — Procedures (Signed)
LM-normal  LAD-proximal 95%, hazy with thrombus, TIMI 3 flow distally   LCX-normal .   RCA-normal. .   Right radial access. No complications.   EBL-minimal.   Specimen-none.   Reviewed with Dr. Delbert Phenixhaudhry. PCI to LAD.

## 2014-11-11 NOTE — Procedures (Signed)
LM-normal  LAD-proximal 95%, hazy with thrombus, TIMI 3 flow distally   LCX-normal .   RCA-normal. .   Right radial access. No complications.   EBL-minimal.   Specimen-none.   Reviewed with Dr. Chaudhry. PCI to LAD.

## 2014-11-11 NOTE — Progress Notes (Addendum)
09:25 Update events, c/o of chest pressure 3/10 since 07:50.  C/o of feeling " a little sweaty", forehead diaphoretic and "breathing feels harder and I feel a little anxious". Dr.Ravindra notified @ 08:45 and orders to d/c fluids and restart ntg gtt, lasix 40 mg ivp. Voided post lasix received and started on ntg gtt @ 5 mcg/min with relief to" 1-2 pain at present".  09:35 Ntg gtt increased to 20 mcg/min per order Dr.Ravindra.Dr.Ravindra updated pt to heart function, echo ordered. Now ekg for chest ldiscomfort. Iv ns decreased to 5/hr.   14:30 Pt with chest pain easing at present to scale of 2 with ntg @ 55 mcg/min. Appetite poor, states " feels better". Family in spouse and updated to events. Pt with occ runs of v-tach reperfusion, asymptomatic. Cont to monitor closely.  17:20 Pt states pain level is " 1-2" at present, cont to monitor , ntg gtt remains at 60 mcg/min. Cont to monitor.  19:10 Bedside report to oncoming shift Forensic scientistKara RN.

## 2014-11-11 NOTE — Progress Notes (Signed)
Spiritual Care Assessment/Progress Notes    Todd MuttonJonathan D Cline [id number]  [id number]    04/26/1951  63 y.o.  male    Patient Telephone Number: [phone] (home)   Religious Affiliation: [employer]   Language: English   Extended Emergency Contact Information  Primary Emergency Contact: Weathersby,Rachel  Address: [address]           [place], [place] [address] [place]  Home Phone: [phone]  Relation: Spouse   Patient Active Problem List    Diagnosis Date Noted   ??? ST elevation myocardial infarction (STEMI) involving left anterior descending (LAD) coronary artery with complication (HCC) 11/11/2014   ??? Systolic CHF, acute (HCC) 11/11/2014   ??? Cardiac arrest (HCC) 11/11/2014   ??? STEMI (ST elevation myocardial infarction) (HCC) 11/10/2014   ??? Ventricular fibrillation (HCC) 11/10/2014        Date: 11/11/2014       Level of Religious/Spiritual Activity:           Involved in faith tradition/spiritual practice             Not involved in faith tradition/spiritual practice           Spiritually oriented             Claims no spiritual orientation             seeking spiritual identity           Feels alienated from religious practice/tradition           Feels angry about religious practice/tradition           Spirituality/religious tradition IS a resource for coping at this time.           Not able to assess due to medical condition    Services Provided Today:           crisis intervention             reading Scriptures           spiritual assessment             prayer           empathic listening/emotional support           rites and rituals (cite in comments)           life review              religious support           theological development            advocacy           ethical dialog              blessing           bereavement support             support to family           anticipatory grief support            help with AMD           spiritual guidance             meditation       Spiritual Care Needs           Emotional Support           Spiritual/Religious Care           Loss/Adjustment  Advocacy/Referral /Ethics           No needs expressed at this time           Other: (note in comments)  Spiritual Care Plan           Follow up visits with pt/family           Provide materials           Schedule sacraments           Contact [facility]           Follow up as needed           Other: (note in comments)     Comments: Initial spiritual assessment in ICU.  Patient, wife, and two adult sons present at time of visit. Patient states that God saved his life last night, and that he is grateful to still be alive. Patient states that he is a [employer]. Chaplain provided emotional support. Advised of Chaplain Availability.    Chaplain Creola Cornob Brown  For additional chaplain support, dial [phone]

## 2014-11-11 NOTE — Progress Notes (Signed)
TRANSFER - OUT REPORT:    Verbal report given to UkraineKara, RN(name) on Todd MuttonJonathan D Cline  being transferred to CCU(unit) for routine progression of care       Report consisted of patient???s Situation, Background, Assessment and   Recommendations(SBAR).     Information from the following report(s) Procedure Summary and MAR was reviewed with the receiving nurse.    Lines:       Opportunity for questions and clarification was provided.      Patient transported with:   Registered Nurse

## 2014-11-11 NOTE — Progress Notes (Signed)
[facility]  Pulmonary, Critical Care, and Sleep Medicine        Name: Todd Cline  MRN: [id number]  Date: 11/11/2014 11:18 AM  Admission Date: 11/10/2014  DOB: 30-Jan-1951        IMPRESSION:   1. Acute anterior wall STEMI- 90% LAD s/p acute PCI/STENT- ECHO pending- appears to have reduced LV fxn - formal read pending  2. VF arrest- brief/in setting of STEMI  3. Leukocytosis  4. AKI- better      RECOMMENDATIONS:   ?? Mgmt per cards  ?? Suggest gentle diuresis  ?? ECHO read pending         Subjective   Pt is alert, denies CP or cough. + mild SOB.       A comprehensive review of systems was negative except for that written in the HPI.  EXAM:  BP 123/78 mmHg   Pulse 100   Temp(Src) 97.7 ??F (36.5 ??C)   Resp 26   Ht 6\' 5"  (1.956 m)   Wt 87.544 kg (193 lb)   BMI 22.88 kg/m2   SpO2 92%Temp (24hrs), Avg:97.9 ??F (36.6 ??C), Min:97.7 ??F (36.5 ??C), Max:98.2 ??F (36.8 ??C)       GENERAL: well developed and in no distress, HEENT:  PERRL, EOMI, no alar flaring or epistaxis, oral mucosa moist without cyanosis, NECK:  no jugular vein distention, no retractions, no thyromegaly or masses, LUNGS: decreased breath sounds billaterally and with rhonchi , HEART:  Regular rate and rhythm with no MGR; no edema is present, ABDOMEN:  soft with no tenderness, bowel sounds present, EXTREMITIES:  warm with no cyanosis and SKIN:  no jaundice or ecchymosis    LABS     Recent Labs      11/10/14   2228   WBC  15.1*   HGB  16.0   HCT  45.6   PLT  304     Recent Labs      11/11/14   0552  11/10/14   2231  11/10/14   2228   NA  135*   --   138   K  5.2*   --   2.9*   CL  103   --   102   CO2  23   --   19*   GLU  167*   --   180*   BUN  13   --   12   CREA  1.24   --   1.54*   CA  8.0*   --   8.2*   ALB   --    --   3.6   SGOT   --    --   29   ALT   --    --   39   INR   --   1.2*  1.0       ABG No results for input(s): PHI, PO2I, PCO2I in the last 72 hours.         Radiology   Films have been personally reviewed.     PCXR with mild pulm edema            Cheree DittoSridhar  Gordon Vandunk, MD

## 2014-11-11 NOTE — Other (Signed)
C/P REHAB:  Chart reviewed.  Admitted with STEMI/cardiac arrest, now S/P PCI to LAD.  LVEF 15-20% on cho 11/11/14.  Current smoker - up to two packs a day.  Met with patient, his wife and son.  Explained educational role of Cardiac Rehab RN.  Gave printed materials on post cath/stent instructions for radial site, post MI instructions, and CHF teaching folder.  Risk factors for CAD were reviewed.  Pt does not really exercise and smokes two packs a day.  He does not go to doctors on a regular basis.  Reviewed S&Ss of MI and the importance of prompt treatment, medication compliance (new stent) and modification of risk factors.  Stressed the importance of medication compliance.  Wife say she believes his diet is fairly heart healthy.  Reviewed CHF teaching due to low EF with emphasis on daily weights, when to call MD for fluid weight gain, low sodium diet, medication compliance and follow up with MD.  Wife mentioned Life Vest and chart mentions possible ICD.  Reviewed the rationale for Life Vest or ICD and printed handout given on ICD placement.  Smoking cessation was counseled. Discussed identifying triggers for smoking and substituting alternative behaviors for smoking. Also discussed methods and products that help one to quit smoking.  Gave contact info for smoking cessation resources.  Encouragement provided.  Pt, wife and son indicated understanding but will need reinforcement and further education and support as appropriate during hospital course.  The patient and family were visiting here from [place], [place].

## 2014-11-11 NOTE — Progress Notes (Addendum)
TRANSFER - IN REPORT:    Verbal report received from Cath Lab(name) on Todd Cline  being received from Cath Lab(unit) for routine progression of care      Report consisted of patient???s Situation, Background, Assessment and   Recommendations(SBAR).     Information from the following report(s) SBAR, ED Summary, Procedure Summary, Intake/Output, MAR and Recent Results was reviewed with the receiving nurse.    Opportunity for questions and clarification was provided.      Assessment completed upon patient???s arrival to unit and care assumed.     0200- pt arrived from cath lab. Pain is rated at 3/10 now. He states, "I am feeling much better."   All pulses are palpable. Pt is alert and oriented. Occasional short bursts of V-tach, asymptomatic. Skin is warm, dry, intact. TR band in place, 15ml of air. Will begin to take down in one hour.   Primary Nurse Payton MccallumKara E Scheid, RN and [facility], RN performed a dual skin assessment on this patient No impairment noted  Braden score is 22.  Patient has K+ IVPB infusing (first run), NS @ 5275ml/hr infusing, and Angiomax @ 8.618ml/hr infusing. I was told that NTG order has been d/c'd though [facility], pt is no longer receiving it.   0330- pt states that he is still having 3/10 CP but states that it feels that same, unchanged. No other symptoms.   0330- Began TB band removal per MD order.   [id number]- Angiomax stopped.   0430- TR Band removed. No bleeding or hematoma. Transparent dressing applied. Will continue to monitor and document per policy.  0600- Pt states that his pain is now 4/10 and that he is having some shortness of breath. Pt appears in no obvious distress and is able to hold full conversation and stand to void without pausing to catch breath. VS stable. EKG obtained per AM order. Cardiac enzymes sent with AM labs. Will continue to monitor closely.

## 2014-11-11 NOTE — Progress Notes (Signed)
Cardiology Progress Note      11/11/2014 10:39 AM    Admit Date: 11/10/2014    Admit Diagnosis: ST elevation myocardial infarction (STEMI) involving left a*      Subjective:     Todd Cline c/o SOB, chest pain. Remains hypertensive. Radial site looks good. Echo pending. LV gram showed EF of 20-25%. Troponin > 100, CPK > 5000.    BP 140/92 mmHg   Pulse 96   Temp(Src) 97.7 ??F (36.5 ??C)   Resp 22   Ht $R'6\' 5"'jF$  (1.956 m)   Wt 193 lb (87.544 kg)   BMI 22.88 kg/m2   SpO2 93%    Current Facility-Administered Medications   Medication Dose Route Frequency   ??? bivalirudin (ANGIOMAX) 250 mg injection       ??? 0.9% sodium chloride (MBP/ADV) 0.9 % infusion       ??? ADDaptor       ??? sodium chloride (NS) flush 5-10 mL  5-10 mL IntraVENous Q8H   ??? sodium chloride (NS) flush 5-10 mL  5-10 mL IntraVENous PRN   ??? aspirin delayed-release tablet 81 mg  81 mg Oral DAILY   ??? atorvastatin (LIPITOR) tablet 40 mg  40 mg Oral QHS   ??? mupirocin (BACTROBAN) 2 % ointment   Both Nostrils BID   ??? pneumococcal 23-valent (PNEUMOVAX 23) injection 0.5 mL  0.5 mL IntraMUSCular PRIOR TO DISCHARGE   ??? ticagrelor (BRILINTA) tablet 90 mg  90 mg Oral BID   ??? iopamidol (ISOVUE-370) 76 % injection       ??? bivalirudin (ANGIOMAX) 250 mg injection       ??? 0.9% sodium chloride (MBP/ADV) 0.9 % infusion       ??? ADDaptor       ??? ticagrelor (BRILINTA) 90 mg tablet       ??? potassium chloride 10 mEq/100 mL IVPB premix pgbk       ??? potassium chloride SR (KLOR-CON 10) tablet 40 mEq  40 mEq Oral NOW   ??? carvedilol (COREG) tablet 6.25 mg  6.25 mg Oral BID WITH MEALS   ??? lisinopril (PRINIVIL, ZESTRIL) tablet 5 mg  5 mg Oral DAILY   ??? [START ON 11/12/2014] furosemide (LASIX) tablet 40 mg  40 mg Oral DAILY   ??? sodium chloride (NS) flush 5-10 mL  5-10 mL IntraVENous Q8H   ??? sodium chloride (NS) flush 5-10 mL  5-10 mL IntraVENous PRN   ??? nitroglycerin (Tridil) 200 mcg/ml infusion  5 mcg/min IntraVENous TITRATE         Objective:      Physical Exam:   BP 140/92 mmHg   Pulse 96   Temp(Src) 97.7 ??F (36.5 ??C)   Resp 22   Ht $R'6\' 5"'Ax$  (1.956 m)   Wt 193 lb (87.544 kg)   BMI 22.88 kg/m2   SpO2 93%  General Appearance:  Well developed, well nourished,alert and oriented x 3, and individual in no acute distress.   Ears/Nose/Mouth/Throat:   Hearing grossly normal.         Neck: Supple.   Chest:   Lungs rales bilaterally.   Cardiovascular:  Regular rate and rhythm, S1, S2 normal, no murmur.   Abdomen:   Soft, non-tender, bowel sounds are active.   Extremities: No edema bilaterally.    Skin: Warm and dry.               Data Review:   Labs:    Recent Results (from the past 24 hour(s))  EKG, 12 LEAD, INITIAL    Collection Time: 11/10/14 10:24 PM   Result Value Ref Range    Ventricular Rate 106 BPM    Atrial Rate 106 BPM    P-R Interval 144 ms    QRS Duration 134 ms    Q-T Interval 370 ms    QTC Calculation (Bezet) 491 ms    Calculated P Axis 73 degrees    Calculated R Axis -14 degrees    Calculated T Axis 15 degrees    Diagnosis       Sinus tachycardia with occasional premature ventricular complexes  Possible Left atrial enlargement  Nonspecific intraventricular block  Cannot rule out Anteroseptal infarct , age undetermined  Lateral injury pattern  ** ** ACUTE MI / STEMI ** **  No previous ECGs available     CBC WITH AUTOMATED DIFF    Collection Time: 11/10/14 10:28 PM   Result Value Ref Range    WBC 15.1 (H) 4.1 - 11.1 K/uL    RBC 4.98 4.10 - 5.70 M/uL    HGB 16.0 12.1 - 17.0 g/dL    HCT 45.6 36.6 - 50.3 %    MCV 91.6 80.0 - 99.0 FL    MCH 32.1 26.0 - 34.0 PG    MCHC 35.1 30.0 - 36.5 g/dL    RDW 13.0 11.5 - 14.5 %    PLATELET 304 150 - 400 K/uL    NEUTROPHILS 49 32 - 75 %    LYMPHOCYTES 37 12 - 49 %    MONOCYTES 10 5 - 13 %    EOSINOPHILS 3 0 - 7 %    BASOPHILS 1 0 - 1 %    ABS. NEUTROPHILS 7.3 1.8 - 8.0 K/UL    ABS. LYMPHOCYTES 5.6 (H) 0.8 - 3.5 K/UL    ABS. MONOCYTES 1.5 (H) 0.0 - 1.0 K/UL    ABS. EOSINOPHILS 0.5 (H) 0.0 - 0.4 K/UL    ABS. BASOPHILS 0.2 (H) 0.0 - 0.1 K/UL     RBC COMMENTS NORMOCYTIC, NORMOCHROMIC      WBC COMMENTS REACTIVE LYMPHS      DF SMEAR SCANNED     CK W/ CKMB & INDEX    Collection Time: 11/10/14 10:28 PM   Result Value Ref Range    CK 165 39 - 308 U/L    CK - MB 3.4 0.5 - 3.6 NG/ML    CK-MB Index 2.1 0 - 2.5     METABOLIC PANEL, COMPREHENSIVE    Collection Time: 11/10/14 10:28 PM   Result Value Ref Range    Sodium 138 136 - 145 mmol/L    Potassium 2.9 (L) 3.5 - 5.1 mmol/L    Chloride 102 97 - 108 mmol/L    CO2 19 (L) 21 - 32 mmol/L    Anion gap 17 (H) 5 - 15 mmol/L    Glucose 180 (H) 65 - 100 mg/dL    BUN 12 6 - 20 MG/DL    Creatinine 1.54 (H) 0.70 - 1.30 MG/DL    BUN/Creatinine ratio 8 (L) 12 - 20      GFR est AA 55 (L) >60 ml/min/1.47m2    GFR est non-AA 46 (L) >60 ml/min/1.43m2    Calcium 8.2 (L) 8.5 - 10.1 MG/DL    Bilirubin, total 0.3 0.2 - 1.0 MG/DL    ALT 39 12 - 78 U/L    AST 29 15 - 37 U/L    Alk. phosphatase 90 45 - 117 U/L  Protein, total 6.9 6.4 - 8.2 g/dL    Albumin 3.6 3.5 - 5.0 g/dL    Globulin 3.3 2.0 - 4.0 g/dL    A-G Ratio 1.1 1.1 - 2.2     TROPONIN I    Collection Time: 11/10/14 10:28 PM   Result Value Ref Range    Troponin-I, Qt. 0.17 (H) <0.05 ng/mL   PTT    Collection Time: 11/10/14 10:28 PM   Result Value Ref Range    aPTT 28.6 22.1 - 32.5 sec    aPTT, therapeutic range     58.0 - 77.0 SECS   PROTHROMBIN TIME + INR    Collection Time: 11/10/14 10:28 PM   Result Value Ref Range    INR 1.0 0.9 - 1.1      Prothrombin time 10.3 9.0 - 11.1 sec   PRO-BNP    Collection Time: 11/10/14 10:28 PM   Result Value Ref Range    NT pro-BNP 268 (H) 0 - 125 PG/ML   POC INR    Collection Time: 11/10/14 10:31 PM   Result Value Ref Range    INR (POC) 1.2 (H) <1.2     POC TROPONIN-I    Collection Time: 11/10/14 10:37 PM   Result Value Ref Range    Troponin-I (POC) 0.17 (H) 0.00 - 0.08 ng/mL   EKG, 12 LEAD, SUBSEQUENT    Collection Time: 11/11/14  2:11 AM   Result Value Ref Range    Ventricular Rate 76 BPM    Atrial Rate 76 BPM    P-R Interval 158 ms     QRS Duration 116 ms    Q-T Interval 410 ms    QTC Calculation (Bezet) 461 ms    Calculated P Axis 72 degrees    Calculated R Axis 81 degrees    Calculated T Axis 15 degrees    Diagnosis       Sinus rhythm with occasional premature ventricular complexes and fusion   complexes  Possible Left atrial enlargement  Incomplete right bundle branch block  Anterolateral infarct , possibly acute  ** ACUTE MI **  When compared with ECG of 10-Nov-2014 22:24,  MANUAL COMPARISON REQUIRED, DATA IS UNCONFIRMED     CK W/ CKMB & INDEX    Collection Time: 11/11/14  5:52 AM   Result Value Ref Range    CK 5013 (H) 39 - 308 U/L    CK - MB >600.0 (H) 0.5 - 3.6 NG/ML    CK-MB Index CANNOT BE CALCULATED 0 - 2.5     TROPONIN I    Collection Time: 11/11/14  5:52 AM   Result Value Ref Range    Troponin-I, Qt. >100.00 (H) <0.05 ng/mL   LIPID PANEL    Collection Time: 11/11/14  5:52 AM   Result Value Ref Range    LIPID PROFILE          Cholesterol, total 164 <200 MG/DL    Triglyceride 284 (H) <150 MG/DL    HDL Cholesterol 48 MG/DL    LDL, calculated 59.2 0 - 100 MG/DL    VLDL, calculated 56.8 MG/DL    CHOL/HDL Ratio 3.4 0 - 5.0     METABOLIC PANEL, BASIC    Collection Time: 11/11/14  5:52 AM   Result Value Ref Range    Sodium 135 (L) 136 - 145 mmol/L    Potassium 5.2 (H) 3.5 - 5.1 mmol/L    Chloride 103 97 - 108 mmol/L    CO2 23 21 -  32 mmol/L    Anion gap 9 5 - 15 mmol/L    Glucose 167 (H) 65 - 100 mg/dL    BUN 13 6 - 20 MG/DL    Creatinine 1.24 0.70 - 1.30 MG/DL    BUN/Creatinine ratio 10 (L) 12 - 20      GFR est AA >60 >60 ml/min/1.15m2    GFR est non-AA 59 (L) >60 ml/min/1.14m2    Calcium 8.0 (L) 8.5 - 10.1 MG/DL   EKG, 12 LEAD, SUBSEQUENT    Collection Time: 11/11/14  6:07 AM   Result Value Ref Range    Ventricular Rate 77 BPM    Atrial Rate 77 BPM    P-R Interval 152 ms    QRS Duration 122 ms    Q-T Interval 404 ms    QTC Calculation (Bezet) 457 ms    Calculated P Axis 74 degrees    Calculated R Axis 72 degrees     Calculated T Axis 57 degrees    Diagnosis       Normal sinus rhythm  Possible Left atrial enlargement  Right bundle branch block  Anterolateral infarct , age undetermined  When compared with ECG of 11-Nov-2014 02:11,  MANUAL COMPARISON REQUIRED, DATA IS UNCONFIRMED     ECG RHYTHM ANALYSIS ADULT    Collection Time: 11/11/14  9:40 AM   Result Value Ref Range    Ventricular Rate 59 BPM    Atrial Rate 59 BPM    P-R Interval 48 ms    QRS Duration 158 ms    Q-T Interval 562 ms    QTC Calculation (Bezet) 556 ms    Calculated R Axis 140 degrees    Calculated T Axis 130 degrees    Diagnosis       Sinus bradycardia with short PR  Right axis deviation  Nonspecific intraventricular block  Possible Right ventricular hypertrophy  Marked T wave abnormality, consider anterolateral ischemia  When compared with ECG of 11-Nov-2014 02:11,  MANUAL COMPARISON REQUIRED, DATA IS UNCONFIRMED         Telemetry: normal sinus rhythm      Assessment:     Principal Problem:    STEMI (ST elevation myocardial infarction) ([place]) (11/10/2014)    Active Problems:    Ventricular fibrillation (HCC) (11/10/2014)      ST elevation myocardial infarction (STEMI) involving left anterior descending (LAD) coronary artery with complication (HCC) (19/04/931)      Systolic CHF, acute ([place]) (11/11/2014)      Cardiac arrest ([place]) (11/11/2014)        Plan:     Principal Problem:    STEMI (ST elevation myocardial infarction) ([place]) (11/10/2014)- stable. Continue current treatment. Continue NTG drip.    Active Problems:    Ventricular fibrillation ([place]) (11/10/2014)- no recurrence.      ST elevation myocardial infarction (STEMI) involving left anterior descending (LAD) coronary artery with complication ([place]) (67/12/2456)      Systolic CHF, acute ([place]) (11/11/2014)- d/c IV fluids. Diurese. Will switch to coreg. Start ACEI. Check echo. Will need ICD with his decreased EF, cardiac arrest, discuss timing with EP.        [id number] Ramond Dial, MD

## 2014-11-11 NOTE — Progress Notes (Signed)
Echo done

## 2014-11-12 LAB — EKG, 12 LEAD, SUBSEQUENT
Atrial Rate: 83 {beats}/min
Calculated P Axis: 70 degrees
Calculated R Axis: 80 degrees
Calculated T Axis: 46 degrees
Diagnosis: NORMAL
P-R Interval: 144 ms
Q-T Interval: 396 ms
QRS Duration: 128 ms
QTC Calculation (Bezet): 465 ms
Ventricular Rate: 83 {beats}/min

## 2014-11-12 LAB — CBC WITH AUTOMATED DIFF
ABS. BASOPHILS: 0 10*3/uL (ref 0.0–0.1)
ABS. EOSINOPHILS: 0 10*3/uL (ref 0.0–0.4)
ABS. LYMPHOCYTES: 1.2 10*3/uL (ref 0.8–3.5)
ABS. MONOCYTES: 1.6 10*3/uL — ABNORMAL HIGH (ref 0.0–1.0)
ABS. NEUTROPHILS: 11.4 10*3/uL — ABNORMAL HIGH (ref 1.8–8.0)
BASOPHILS: 0 % (ref 0–1)
EOSINOPHILS: 0 % (ref 0–7)
HCT: 40.1 % (ref 36.6–50.3)
HGB: 13.9 g/dL (ref 12.1–17.0)
LYMPHOCYTES: 8 % — ABNORMAL LOW (ref 12–49)
MCH: 31.3 PG (ref 26.0–34.0)
MCHC: 34.7 g/dL (ref 30.0–36.5)
MCV: 90.3 FL (ref 80.0–99.0)
MONOCYTES: 11 % (ref 5–13)
NEUTROPHILS: 81 % — ABNORMAL HIGH (ref 32–75)
PLATELET: 224 10*3/uL (ref 150–400)
RBC: 4.44 M/uL (ref 4.10–5.70)
RDW: 13.2 % (ref 11.5–14.5)
WBC: 14.1 10*3/uL — ABNORMAL HIGH (ref 4.1–11.1)

## 2014-11-12 LAB — METABOLIC PANEL, BASIC
Anion gap: 7 mmol/L (ref 5–15)
BUN/Creatinine ratio: 13 (ref 12–20)
BUN: 13 MG/DL (ref 6–20)
CO2: 26 mmol/L (ref 21–32)
Calcium: 8.3 MG/DL — ABNORMAL LOW (ref 8.5–10.1)
Chloride: 102 mmol/L (ref 97–108)
Creatinine: 0.97 MG/DL (ref 0.70–1.30)
GFR est AA: >60 mL/min/{1.73_m2} (ref >60)
GFR est non-AA: >60 mL/min/{1.73_m2} (ref >60)
Glucose: 122 mg/dL — ABNORMAL HIGH (ref 65–100)
Potassium: 4 mmol/L (ref 3.5–5.1)
Sodium: 135 mmol/L — ABNORMAL LOW (ref 136–145)

## 2014-11-12 MED ORDER — METOPROLOL TARTRATE 5 MG/5 ML IV SOLN
5 mg/ mL | Freq: Once | INTRAVENOUS | Status: AC
Start: 2014-11-12 — End: 2014-11-12
  Administered 2014-11-12: 17:00:00 via INTRAVENOUS

## 2014-11-12 MED ORDER — CARVEDILOL 12.5 MG TAB
12.5 mg | Freq: Two times a day (BID) | ORAL | Status: DC
Start: 2014-11-12 — End: 2014-11-14
  Administered 2014-11-12 – 2014-11-14 (×4): via ORAL

## 2014-11-12 MED FILL — CARVEDILOL 3.125 MG TAB: 3.125 mg | ORAL | Qty: 2

## 2014-11-12 MED FILL — BRILINTA 90 MG TABLET: 90 mg | ORAL | Qty: 1

## 2014-11-12 MED FILL — BD POSIFLUSH NORMAL SALINE 0.9 % INJECTION SYRINGE: INTRAMUSCULAR | Qty: 10

## 2014-11-12 MED FILL — NITROGLYCERIN IN D5W 200 MCG/ML IV: 50 mg/2 mL (200 mcg/mL) | INTRAVENOUS | Qty: 250

## 2014-11-12 MED FILL — ASPIRIN 81 MG TAB, DELAYED RELEASE: 81 mg | ORAL | Qty: 1

## 2014-11-12 MED FILL — ATORVASTATIN 40 MG TAB: 40 mg | ORAL | Qty: 1

## 2014-11-12 MED FILL — CARVEDILOL 12.5 MG TAB: 12.5 mg | ORAL | Qty: 1

## 2014-11-12 MED FILL — BD POSIFLUSH NORMAL SALINE 0.9 % INJECTION SYRINGE: INTRAMUSCULAR | Qty: 20

## 2014-11-12 MED FILL — LISINOPRIL 5 MG TAB: 5 mg | ORAL | Qty: 1

## 2014-11-12 MED FILL — METOPROLOL TARTRATE 5 MG/5 ML IV SOLN: 5 mg/ mL | INTRAVENOUS | Qty: 5

## 2014-11-12 MED FILL — FUROSEMIDE 40 MG TAB: 40 mg | ORAL | Qty: 1

## 2014-11-12 NOTE — Progress Notes (Addendum)
Cardiology Progress Note      11/12/2014 11:30 AM    Admit Date: 11/10/2014    Admit Diagnosis: ST elevation myocardial infarction (STEMI) involving left a*      Subjective:     Jabier MuttonJonathan D Peppers still has 1-2/10 chest pain. Breathing is back to normal. Sinus tachycardia. Echo showed EF 20%.    BP 104/65 mmHg   Pulse 99   Temp(Src) 98.6 ??F (37 ??C)   Resp 23   Ht 6\' 5"  (1.956 m)   Wt 193 lb (87.544 kg)   BMI 22.88 kg/m2   SpO2 97%    Current Facility-Administered Medications   Medication Dose Route Frequency   ??? metoprolol (LOPRESSOR) injection 2.5 mg  2.5 mg IntraVENous ONCE   ??? bivalirudin (ANGIOMAX) 250 mg injection       ??? 0.9% sodium chloride (MBP/ADV) 0.9 % infusion       ??? ADDaptor       ??? sodium chloride (NS) flush 5-10 mL  5-10 mL IntraVENous Q8H   ??? sodium chloride (NS) flush 5-10 mL  5-10 mL IntraVENous PRN   ??? aspirin delayed-release tablet 81 mg  81 mg Oral DAILY   ??? atorvastatin (LIPITOR) tablet 40 mg  40 mg Oral QHS   ??? mupirocin (BACTROBAN) 2 % ointment   Both Nostrils BID   ??? pneumococcal 23-valent (PNEUMOVAX 23) injection 0.5 mL  0.5 mL IntraMUSCular PRIOR TO DISCHARGE   ??? ticagrelor (BRILINTA) tablet 90 mg  90 mg Oral BID   ??? iopamidol (ISOVUE-370) 76 % injection       ??? bivalirudin (ANGIOMAX) 250 mg injection       ??? 0.9% sodium chloride (MBP/ADV) 0.9 % infusion       ??? ADDaptor       ??? ticagrelor (BRILINTA) 90 mg tablet       ??? carvedilol (COREG) tablet 6.25 mg  6.25 mg Oral BID WITH MEALS   ??? lisinopril (PRINIVIL, ZESTRIL) tablet 5 mg  5 mg Oral DAILY   ??? furosemide (LASIX) tablet 40 mg  40 mg Oral DAILY   ??? sodium chloride (NS) flush 5-10 mL  5-10 mL IntraVENous Q8H   ??? sodium chloride (NS) flush 5-10 mL  5-10 mL IntraVENous PRN   ??? nitroglycerin (Tridil) 200 mcg/ml infusion  5-200 mcg/min IntraVENous TITRATE         Objective:      Physical Exam:  BP 104/65 mmHg   Pulse 99   Temp(Src) 98.6 ??F (37 ??C)   Resp 23   Ht 6\' 5"  (1.956 m)   Wt 193 lb (87.544 kg)   BMI 22.88 kg/m2   SpO2 97%   General Appearance:  Well developed, well nourished,alert and oriented x 3, and individual in no acute distress.   Ears/Nose/Mouth/Throat:   Hearing grossly normal.         Neck: Supple.   Chest:   Lungs clear to auscultation bilaterally.   Cardiovascular:  Regular rate and rhythm, S1, S2 normal, no murmur.   Abdomen:   Soft, non-tender, bowel sounds are active.   Extremities: No edema bilaterally.    Skin: Warm and dry.               Data Review:   Labs:    Recent Results (from the past 24 hour(s))   CK W/ CKMB & INDEX    Collection Time: 11/11/14  4:34 PM   Result Value Ref Range    CK 4546 (H) 39 -  308 U/L    CK - MB >600.0 (H) 0.5 - 3.6 NG/ML    CK-MB Index CANNOT BE CALCULATED 0 - 2.5     TROPONIN I    Collection Time: 11/11/14  4:34 PM   Result Value Ref Range    Troponin-I, Qt. >100.00 (H) <0.05 ng/mL   METABOLIC PANEL, BASIC    Collection Time: 11/12/14  3:24 AM   Result Value Ref Range    Sodium 135 (L) 136 - 145 mmol/L    Potassium 4.0 3.5 - 5.1 mmol/L    Chloride 102 97 - 108 mmol/L    CO2 26 21 - 32 mmol/L    Anion gap 7 5 - 15 mmol/L    Glucose 122 (H) 65 - 100 mg/dL    BUN 13 6 - 20 MG/DL    Creatinine 1.610.97 0.960.70 - 1.30 MG/DL    BUN/Creatinine ratio 13 12 - 20      GFR est AA >60 >60 ml/min/1.8073m2    GFR est non-AA >60 >60 ml/min/1.3773m2    Calcium 8.3 (L) 8.5 - 10.1 MG/DL   CBC WITH AUTOMATED DIFF    Collection Time: 11/12/14  3:24 AM   Result Value Ref Range    WBC 14.1 (H) 4.1 - 11.1 K/uL    RBC 4.44 4.10 - 5.70 M/uL    HGB 13.9 12.1 - 17.0 g/dL    HCT 04.540.1 40.936.6 - 81.150.3 %    MCV 90.3 80.0 - 99.0 FL    MCH 31.3 26.0 - 34.0 PG    MCHC 34.7 30.0 - 36.5 g/dL    RDW 91.413.2 78.211.5 - 95.614.5 %    PLATELET 224 150 - 400 K/uL    NEUTROPHILS 81 (H) 32 - 75 %    LYMPHOCYTES 8 (L) 12 - 49 %    MONOCYTES 11 5 - 13 %    EOSINOPHILS 0 0 - 7 %    BASOPHILS 0 0 - 1 %    ABS. NEUTROPHILS 11.4 (H) 1.8 - 8.0 K/UL    ABS. LYMPHOCYTES 1.2 0.8 - 3.5 K/UL    ABS. MONOCYTES 1.6 (H) 0.0 - 1.0 K/UL     ABS. EOSINOPHILS 0.0 0.0 - 0.4 K/UL    ABS. BASOPHILS 0.0 0.0 - 0.1 K/UL   EKG, 12 LEAD, SUBSEQUENT    Collection Time: 11/12/14  6:00 AM   Result Value Ref Range    Ventricular Rate 83 BPM    Atrial Rate 83 BPM    P-R Interval 144 ms    QRS Duration 128 ms    Q-T Interval 396 ms    QTC Calculation (Bezet) 465 ms    Calculated P Axis 70 degrees    Calculated R Axis 80 degrees    Calculated T Axis 46 degrees    Diagnosis       Normal sinus rhythm  Right bundle branch block  Anterolateral infarct (cited on or before 10-Nov-2014)  ** ACUTE MI **  When compared with ECG of 11-Nov-2014 10:09,  Right bundle branch block is now present  Questionable change in initial forces of Lateral leads         Telemetry: sinus tachycardia      Assessment:     Principal Problem:    STEMI (ST elevation myocardial infarction) (HCC) (11/10/2014)    Active Problems:    Ventricular fibrillation (HCC) (11/10/2014)      ST elevation myocardial infarction (STEMI) involving left anterior descending (LAD) coronary artery with complication (  HCC) (11/11/2014)      Systolic CHF, acute (HCC) (11/11/2014)      Cardiac arrest (HCC) (11/11/2014)        Plan:     Principal Problem:    STEMI (ST elevation myocardial infarction) (HCC) (11/10/2014)- stable. No ectopy. Wean NTG. Additional IV betablocker to keep HR < 80. Increase coreg.    Active Problems:    Ventricular fibrillation (HCC) (11/10/2014)      ST elevation myocardial infarction (STEMI) involving left anterior descending (LAD) coronary artery with complication (HCC) (11/11/2014)      Systolic CHF, acute (HCC) (11/11/2014)- compensated. Continue diuresis. On ACE. Will need lifevest before d/c. Discuss ICD with EP.      Cardiac arrest (HCC) (11/11/2014)    Transfer to floor, ambulate.        [id number] Beckie Salts, MD

## 2014-11-12 NOTE — Progress Notes (Incomplete)
Shift overview: patient afebrile, A&O, reports acceptable level of pain 1/10, fair appetite, wife at bedside.    1500 TRANSFER - OUT REPORT:    Verbal report given to Ryerson Incndrew RN (name) on Todd Cline  [facility] for routine progression of care       Report consisted of patient???s Situation, Background, Assessment and   Recommendations(SBAR).     Information from the following report(s) SBAR, Kardex, Intake/Output, MAR and Recent Results was reviewed with the receiving nurse.    Lines:   Peripheral IV 11/10/14 Right Arm (Active)   Site Assessment Clean, dry, & intact 11/12/2014 12:00 PM   Phlebitis Assessment 0 11/12/2014 12:00 PM   Infiltration Assessment 0 11/12/2014 12:00 PM   Dressing Status Clean, dry, & intact 11/12/2014 12:00 PM   Dressing Type Transparent 11/12/2014 12:00 PM   Hub Color/Line Status Capped;Flushed 11/12/2014 12:00 PM       Peripheral IV 11/10/14 Left Arm (Active)   Site Assessment Clean, dry, & intact 11/12/2014 12:00 PM   Phlebitis Assessment 0 11/12/2014 12:00 PM   Infiltration Assessment 0 11/12/2014 12:00 PM   Dressing Status Clean, dry, & intact 11/12/2014 12:00 PM   Dressing Type Transparent 11/12/2014 12:00 PM   Hub Color/Line Status Infusing 11/12/2014 12:00 PM        Opportunity for questions and clarification was provided.      Patient transported with:   Monitor  O2 @ 4 liters  Registered Nurse  [employer]

## 2014-11-12 NOTE — Progress Notes (Addendum)
1550: [facility].    1600: Nitro gtt decreased to 40 mcg/min.    1620: O2 decreased to 2L NC, from 4L.      1643: Nitro gtt decreased to 35 mcg/min.    1727: Nitro decreased to 30 mcg/min.  O2 NC off; pt on room air now.  Patient walked length of hallway.  Patient c/o minor SOB after ambulation; HR in the 120's post ambulation.      1811: Nitro gtt decreased to 25 mcg/min.  2L O2 NC requested by patient due to being SOB; 2L resolved SOB.  95% on RA.        1900: Bedside and Verbal shift change report given to Aundra MilletMegan, Charity fundraiserN (Cabin crewoncoming nurse) by Greig CastillaAndrew, RN (offgoing nurse). Report included the following information SBAR, Kardex, MAR, Accordion and Recent Results.

## 2014-11-12 NOTE — Progress Notes (Signed)
Patient still having 1-2/10 chest pain. Feels that this is an acceptable level and does not wish for more relief from NTG. He is comfortable and slept well over night. Still on 6260mcg/min of NTG.

## 2014-11-13 LAB — EKG, 12 LEAD, SUBSEQUENT
Atrial Rate: 98 {beats}/min
Calculated P Axis: 67 degrees
Calculated R Axis: 20 degrees
Calculated T Axis: 12 degrees
Diagnosis: NORMAL
P-R Interval: 176 ms
Q-T Interval: 366 ms
QRS Duration: 126 ms
QTC Calculation (Bezet): 467 ms
Ventricular Rate: 98 {beats}/min

## 2014-11-13 LAB — MAGNESIUM: Magnesium: 2.3 mg/dL (ref 1.6–2.4)

## 2014-11-13 MED ORDER — METOPROLOL TARTRATE 5 MG/5 ML IV SOLN
5 mg/ mL | INTRAVENOUS | Status: AC
Start: 2014-11-13 — End: 2014-11-13
  Administered 2014-11-13: 16:00:00

## 2014-11-13 MED ORDER — METOPROLOL TARTRATE 5 MG/5 ML IV SOLN
5 mg/ mL | Freq: Once | INTRAVENOUS | Status: AC
Start: 2014-11-13 — End: 2014-11-13
  Administered 2014-11-13: 15:00:00 via INTRAVENOUS

## 2014-11-13 MED ORDER — PNEUMOCOCCAL 23-VALPS VACCINE 25 MCG/0.5 ML INJECTION
25 mcg/0.5 mL | INTRAMUSCULAR | Status: AC
Start: 2014-11-13 — End: 2014-11-13
  Administered 2014-11-13: 13:00:00 via INTRAMUSCULAR

## 2014-11-13 MED FILL — FUROSEMIDE 40 MG TAB: 40 mg | ORAL | Qty: 1

## 2014-11-13 MED FILL — BD POSIFLUSH NORMAL SALINE 0.9 % INJECTION SYRINGE: INTRAMUSCULAR | Qty: 10

## 2014-11-13 MED FILL — ASPIRIN 81 MG TAB, DELAYED RELEASE: 81 mg | ORAL | Qty: 1

## 2014-11-13 MED FILL — METOPROLOL TARTRATE 5 MG/5 ML IV SOLN: 5 mg/ mL | INTRAVENOUS | Qty: 5

## 2014-11-13 MED FILL — PNEUMOVAX-23 25 MCG/0.5 ML INJECTION SOLUTION: 25 mcg/0.5 mL | INTRAMUSCULAR | Qty: 0.5

## 2014-11-13 MED FILL — LISINOPRIL 5 MG TAB: 5 mg | ORAL | Qty: 1

## 2014-11-13 MED FILL — BRILINTA 90 MG TABLET: 90 mg | ORAL | Qty: 1

## 2014-11-13 MED FILL — ATORVASTATIN 40 MG TAB: 40 mg | ORAL | Qty: 1

## 2014-11-13 MED FILL — CARVEDILOL 12.5 MG TAB: 12.5 mg | ORAL | Qty: 1

## 2014-11-13 NOTE — Progress Notes (Signed)
Bedside shift change report given to Megan RN (oncoming nurse) by Christina RN (offgoing nurse). Report included the following information SBAR, Kardex, MAR and Recent Results.

## 2014-11-13 NOTE — Other (Signed)
Cardiopulmonary Rehab:    Chart reviewed. Pt is a 63 y.o. male admitted with STEMI/cardiac arrest, now S/P PCI to LAD. LVEF 15-20% on cho 11/11/14. Current smoker - up to two packs a day.    Pt visited, wife at the bedside. The pt received information regarding the low sodium diet. Instruction given on s/s of CHF, checking weight every am and calling MD if weight is up 2-3 lbs in a day or 5 lbs in a week (or as directed by the physician), fluid/Na restrictions, s/s of worsening CHF and when to call MD.  Discussed the CHF "zones" and subsequent actions with pt. Reviewed activity as tolerated with frequent rest periods as needed, taking medications as prescribed, and the importance of follow up visits with physician.        Several questions answered regarding living with a Life Vest answered. [employer] pamphlet provided.     Reviewed progressive return to activity as tolerated with frequent rest periods as needed, taking medications as prescribed, the benefits of outpatient cardiac rehab and the importance of follow up visits with physician. Answered wife questions regarding the different types of cardiologist and who will meet his needs once [place]. Strongly encouraged pt to find a cardiac rehabilitation in his area.      Teacher, English as a foreign languagerinted material given and discussed re: smoking cessation.  Discussed identifying triggers for smoking and substituting alternative behaviors for smoking.  Discussed avoiding second hand smoke and places/circumstances where smoking is common.  Also discussed methods and products that help one to quit smoking. Pt states he plans to quit as of now. He tried in the past, cold [place] and the longest cessation was 3 months. Discussed possibly using products to help craves, urges. Wife very supportive of changes needing to be made.     Pt/wife verbalized understanding.

## 2014-11-13 NOTE — Progress Notes (Signed)
Bedside report from [facility]  RN.  No pain or complaints.  NSR.

## 2014-11-13 NOTE — Progress Notes (Signed)
Cardiology Progress Note      11/13/2014 12:45 PM    Admit Date: 11/10/2014    Admit Diagnosis: ST elevation myocardial infarction (STEMI) involving left a*      Subjective:     Todd Cline feeling better.no chest pain. Tachycardic with minimal activity. Off NTG.    BP 93/58 mmHg   Pulse 92   Temp(Src) 98.7 ??F (37.1 ??C)   Resp 20   Ht 6\' 5"  (1.956 m)   Wt 193 lb (87.544 kg)   BMI 22.88 kg/m2   SpO2 99%    Current Facility-Administered Medications   Medication Dose Route Frequency   ??? metoprolol (LOPRESSOR) 5 mg/5 mL injection       ??? carvedilol (COREG) tablet 12.5 mg  12.5 mg Oral BID WITH MEALS   ??? sodium chloride (NS) flush 5-10 mL  5-10 mL IntraVENous Q8H   ??? sodium chloride (NS) flush 5-10 mL  5-10 mL IntraVENous PRN   ??? aspirin delayed-release tablet 81 mg  81 mg Oral DAILY   ??? atorvastatin (LIPITOR) tablet 40 mg  40 mg Oral QHS   ??? mupirocin (BACTROBAN) 2 % ointment   Both Nostrils BID   ??? ticagrelor (BRILINTA) tablet 90 mg  90 mg Oral BID   ??? lisinopril (PRINIVIL, ZESTRIL) tablet 5 mg  5 mg Oral DAILY   ??? furosemide (LASIX) tablet 40 mg  40 mg Oral DAILY   ??? sodium chloride (NS) flush 5-10 mL  5-10 mL IntraVENous Q8H   ??? sodium chloride (NS) flush 5-10 mL  5-10 mL IntraVENous PRN   ??? nitroglycerin (Tridil) 200 mcg/ml infusion  5-200 mcg/min IntraVENous TITRATE         Objective:      Physical Exam:  BP 93/58 mmHg   Pulse 92   Temp(Src) 98.7 ??F (37.1 ??C)   Resp 20   Ht 6\' 5"  (1.956 m)   Wt 193 lb (87.544 kg)   BMI 22.88 kg/m2   SpO2 99%  General Appearance:  Well developed, well nourished,alert and oriented x 3, and individual in no acute distress.   Ears/Nose/Mouth/Throat:   Hearing grossly normal.         Neck: Supple.   Chest:   Lungs clear to auscultation bilaterally.   Cardiovascular:  Regular rate and rhythm, S1, S2 normal, no murmur.   Abdomen:   Soft, non-tender, bowel sounds are active.   Extremities: No edema bilaterally.    Skin: Warm and dry.               Data Review:   Labs:     Recent Results (from the past 24 hour(s))   EKG, 12 LEAD, SUBSEQUENT    Collection Time: 11/13/14  5:13 AM   Result Value Ref Range    Ventricular Rate 98 BPM    Atrial Rate 98 BPM    P-R Interval 176 ms    QRS Duration 126 ms    Q-T Interval 366 ms    QTC Calculation (Bezet) 467 ms    Calculated P Axis 67 degrees    Calculated R Axis 20 degrees    Calculated T Axis 12 degrees    Diagnosis       Normal sinus rhythm  Right bundle branch block  Anterolateral infarct (cited on or before 10-Nov-2014)  When compared with ECG of 12-Nov-2014 06:00,  Serial changes of Anterior infarct present  Confirmed by Lennie MuckleHagemann, Timothy ([id number]) on 11/13/2014 8:09:37 AM     MAGNESIUM  Collection Time: 11/13/14 10:17 AM   Result Value Ref Range    Magnesium 2.3 1.6 - 2.4 mg/dL       Telemetry: normal sinus rhythm      Assessment:     Principal Problem:    STEMI (ST elevation myocardial infarction) (HCC) (11/10/2014)    Active Problems:    Ventricular fibrillation (HCC) (11/10/2014)      ST elevation myocardial infarction (STEMI) involving left anterior descending (LAD) coronary artery with complication (HCC) (11/11/2014)      Systolic CHF, acute (HCC) (11/11/2014)      Cardiac arrest (HCC) (11/11/2014)        Plan:   Principal Problem:    STEMI (ST elevation myocardial infarction) (HCC) (11/10/2014)- stable, IV betablocker for better rate control. Increase coreg if BP can tolerate. Repeat limited echo today.    Active Problems:    Ventricular fibrillation (HCC) (11/10/2014)      ST elevation myocardial infarction (STEMI) involving left anterior descending (LAD) coronary artery with complication (HCC) (11/11/2014)      Systolic CHF, acute (HCC) (11/11/2014)- compensated. lifevest before discharge.      Cardiac arrest [facility](HCC) (11/11/2014)    Discussed with wife. Home 1-2 days.          FAOZHYQMVHQIONGPINDIPAPANAHALL Beckie SaltsV Jessicia Napolitano, MD

## 2014-11-13 NOTE — Progress Notes (Signed)
Echo completed

## 2014-11-13 NOTE — Progress Notes (Signed)
Bedside shift change report given to Christina RN (oncoming nurse) by Megan RN (offgoing nurse). Report included the following information SBAR, Kardex, MAR and Recent Results.

## 2014-11-14 ENCOUNTER — Encounter: Payer: Self-pay | Admitting: Internal Medicine

## 2014-11-14 MED ORDER — CARVEDILOL 12.5 MG TAB
12.5 mg | Freq: Two times a day (BID) | ORAL | Status: DC
Start: 2014-11-14 — End: 2014-11-15
  Administered 2014-11-14 – 2014-11-15 (×2): via ORAL

## 2014-11-14 MED ORDER — LISINOPRIL 5 MG TAB
5 mg | Freq: Every day | ORAL | Status: DC
Start: 2014-11-14 — End: 2014-11-15
  Administered 2014-11-15: 14:00:00 via ORAL

## 2014-11-14 MED ORDER — LOPERAMIDE 2 MG CAP
2 mg | ORAL | Status: DC | PRN
Start: 2014-11-14 — End: 2014-11-15
  Administered 2014-11-14 – 2014-11-15 (×3): via ORAL

## 2014-11-14 MED FILL — LOPERAMIDE 2 MG CAP: 2 mg | ORAL | Qty: 1

## 2014-11-14 MED FILL — BD POSIFLUSH NORMAL SALINE 0.9 % INJECTION SYRINGE: INTRAMUSCULAR | Qty: 20

## 2014-11-14 MED FILL — CARVEDILOL 12.5 MG TAB: 12.5 mg | ORAL | Qty: 1

## 2014-11-14 MED FILL — ATORVASTATIN 40 MG TAB: 40 mg | ORAL | Qty: 1

## 2014-11-14 MED FILL — LISINOPRIL 5 MG TAB: 5 mg | ORAL | Qty: 1

## 2014-11-14 MED FILL — BD POSIFLUSH NORMAL SALINE 0.9 % INJECTION SYRINGE: INTRAMUSCULAR | Qty: 10

## 2014-11-14 MED FILL — BRILINTA 90 MG TABLET: 90 mg | ORAL | Qty: 1

## 2014-11-14 MED FILL — ASPIRIN 81 MG TAB, DELAYED RELEASE: 81 mg | ORAL | Qty: 1

## 2014-11-14 MED FILL — FUROSEMIDE 40 MG TAB: 40 mg | ORAL | Qty: 1

## 2014-11-14 MED FILL — CARVEDILOL 12.5 MG TAB: 12.5 mg | ORAL | Qty: 3

## 2014-11-14 NOTE — Other (Signed)
Cardiopulmonary Rehab:    Chart reviewed. Pt is a 63 y.o. male admitted with STEMI/cardiac arrest, now S/P PCI to LAD. LVEF 15-20% on echo 11/11/14. Current smoker - up to two packs a day.  Pt resides in [place]. Pt was given Post MI, CHF, Post Cath, and Smoking Cessation information yesterday as well as information on Life Vest.     Met with Pt who was sitting up in bed his wife by bedside.  Reminded Pt and his wife that all information that we are providing is listed in the handouts.  They stated they will contact their Cardiologist as soon as they return home as anxious to begin a C/P Rehab Program.  Reviewed signs and symptoms to monitor for and when to call the MD.  Reviewed indications, dosing instructions, medication interactions and side effects of Brillinta.  Pt to utilize a pill box to prevent missed dosages and restenosis.  Reviewed foods high in Na to limit in his diet.  Discussed importance of reading food labels and staying within 1500 mg of NA per day. Pt does have a scale at home and will begin weighing daily and documenting.     Reviewed printed material  re: smoking cessation. Discussed identifying triggers for smoking. Discussed avoiding second hand smoke and places/circumstances where smoking is common. Also discussed methods and products that help one to quit smoking. Pt states he plans to quit as of now. He tried in the past, cold [place] and the longest cessation was 3 months. Discussed healthy substitutes to use in place of smoking. Wife very supportive of changes needing to be made.    Pt and wife without questions

## 2014-11-14 NOTE — Progress Notes (Addendum)
Bedside shift change report given to Cathlean CowerLesley, Charity fundraiserN (oncoming nurse) by Lynden Angathy, RN (offgoing nurse). Report included the following information SBAR, Kardex, Intake/Output, MAR, Recent Results and Cardiac Rhythm NSR PVCs.     0730- Oxygen removed. O2 sat 94% on RA.     1000- PRN imodium administered for diarrhea.    1100- I.S. Teaching provided. Pt was able to demonstrate understanding.    1300- Pt ambulating the halls without SOB or difficulty.     1800- Pt denies pain. 97% on RA.     1900- Bedside shift change report given to France RavensMercedes, Charity fundraiserN (Cabin crewoncoming nurse) by Cathlean CowerLesley, RN (offgoing nurse). Report included the following information SBAR, Kardex, Intake/Output, MAR, Recent Results and Cardiac Rhythm NSR.

## 2014-11-14 NOTE — Progress Notes (Signed)
Care Management:    Admitted with MI and cardiac arrest. [place] cath [facility] [place] [facility]. Wife is at bedside and will transport.    Patient is independent, he works and he drives.    He does not have a cardiologist in [place] but his wife has recommendations from friends and will call today to get him a follow up appointment with a cardiology group.    His PCP moved about a year ago and he states he will call the group back and get connected with another doctor in that group.     Care Management Interventions  PCP Verified by CM?: No  Care Management Consult: No  Current Support Network: Lives with Spouse (Lives with wife in [place] and is independent with ADLs. )  Confirm Follow Up Transport: Family (Patient and wife both drive. )  Plan discussed with Pt/Family/Caregiver: Yes  Discharge Location  Discharge Placement: Home     Karen Rishcoff crm acm [phone]

## 2014-11-14 NOTE — Progress Notes (Signed)
Bedside report to [facility].  NSR w frequent PVC's.

## 2014-11-14 NOTE — Progress Notes (Addendum)
[address], [place], [place] [id number]  [phone]      Cardiology Progress Note      11/14/2014 9:31 AM    Admit Date: 11/10/2014    Admit Diagnosis:   ST elevation myocardial infarction (STEMI) involving left anterior descending (LAD) coronary artery with complication (HCC)    Subjective:     Todd Cline overall feels better, but still feeling DOE.  O2 sats 98-100% on RA, anxious that he can't get his breath. Diuresing 1.6 L neg. Ambulating without difficulty. C/o diarrhea x 6 since yesterday afternoon.  Afebrile, no abdominal discomfort.    BP 117/64 mmHg   Pulse 95   Temp(Src) 97.8 ??F (36.6 ??C)   Resp 18   Ht 6\' 5"  (1.956 m)   Wt 87.544 kg (193 lb)   BMI 22.88 kg/m2   SpO2 98%    Current Facility-Administered Medications   Medication Dose Route Frequency   ??? carvedilol (COREG) tablet 37.5 mg  37.5 mg Oral BID WITH MEALS   ??? [START ON 11/15/2014] lisinopril (PRINIVIL, ZESTRIL) tablet 2.5 mg  2.5 mg Oral DAILY   ??? loperamide (IMODIUM) capsule 2 mg  2 mg Oral Q4H PRN   ??? sodium chloride (NS) flush 5-10 mL  5-10 mL IntraVENous Q8H   ??? sodium chloride (NS) flush 5-10 mL  5-10 mL IntraVENous PRN   ??? aspirin delayed-release tablet 81 mg  81 mg Oral DAILY   ??? atorvastatin (LIPITOR) tablet 40 mg  40 mg Oral QHS   ??? mupirocin (BACTROBAN) 2 % ointment   Both Nostrils BID   ??? ticagrelor (BRILINTA) tablet 90 mg  90 mg Oral BID   ??? furosemide (LASIX) tablet 40 mg  40 mg Oral DAILY   ??? sodium chloride (NS) flush 5-10 mL  5-10 mL IntraVENous Q8H   ??? sodium chloride (NS) flush 5-10 mL  5-10 mL IntraVENous PRN   ??? nitroglycerin (Tridil) 200 mcg/ml infusion  5-200 mcg/min IntraVENous TITRATE       Objective:      Physical Exam:  General Appearance: ??WNWD caucasian male in no acute dsitress  Chest:?? ??slight rales LLL  Cardiovascular: ??tachy no murmur.??  Abdomen:?? ??Soft, non-tender, bowel sounds are active.??  Extremities: no peripheral edema  Skin:?? Warm and dry.??    Data Review:   Recent Labs      11/12/14    0324   WBC  14.1*   HGB  13.9   HCT  40.1   PLT  224     Recent Labs      11/13/14   1017  11/12/14   0324   NA   --   135*   K   --   4.0   CL   --   102   CO2   --   26   GLU   --   122*   BUN   --   13   CREA   --   0.97   CA   --   8.3*   MG  2.3   --        Recent Labs      11/11/14   1634   TROIQ  >100.00*   CPK  4546*   CKMB  >600.0*         Intake/Output Summary (Last 24 hours) at 11/14/14 0931  Last data filed at 11/13/14 2300   Gross per 24 hour   Intake    100 ml  Output      0 ml   Net    100 ml        Telemetry: SR-ST  EKG:ST with RBBB      Assessment:     Principal Problem:    STEMI (ST elevation myocardial infarction) (HCC) (11/10/2014)    Active Problems:    Ventricular fibrillation (HCC) (11/10/2014)      ST elevation myocardial infarction (STEMI) involving left anterior descending (LAD) coronary artery with complication (HCC) (11/11/2014)      Systolic CHF, acute (HCC) (11/11/2014)      Cardiac arrest (HCC) (11/11/2014)        Plan:   STEMI: stable. Continue on ASA, Brilinita 90mg  BID, increasing BB to 37.5mg  BID for ST as BP allows.     Ventricular fibrillation with cardiac arrest: r/t STEMI, no further arrhythmia, occ PVCs    Systolic CHF, acute: compensated. Continue diuresis. SOB likely r/t anxiety as sats within normal range. Continue coreg and ACE. Lifevest to be placed today. Encouraging ambulation, will get patient incentive spirometer.     Diarrhea:  No fever, abdominal discomfort.  Side effect of medications?  To check for CDiff today.  Treat with imodium.     Plan for discharge [place] home tomorrow [place].        [facility]    11/14/2014         Agree with note as outlined by  NP. I confirm findings in history and physical exam. No additional findings noted. Agree with plan as outlined above.     JYNWGNFAOZHYQMVPINDIPAPANAHALL Beckie SaltsV Jaquesha Boroff, MD

## 2014-11-15 LAB — CBC WITH AUTOMATED DIFF
ABS. BASOPHILS: 0 10*3/uL (ref 0.0–0.1)
ABS. EOSINOPHILS: 0.2 10*3/uL (ref 0.0–0.4)
ABS. LYMPHOCYTES: 1.1 10*3/uL (ref 0.8–3.5)
ABS. MONOCYTES: 1.2 10*3/uL — ABNORMAL HIGH (ref 0.0–1.0)
ABS. NEUTROPHILS: 5.3 10*3/uL (ref 1.8–8.0)
BASOPHILS: 0 % (ref 0–1)
EOSINOPHILS: 2 % (ref 0–7)
HCT: 37 % (ref 36.6–50.3)
HGB: 12.5 g/dL (ref 12.1–17.0)
LYMPHOCYTES: 14 % (ref 12–49)
MCH: 30.7 PG (ref 26.0–34.0)
MCHC: 33.8 g/dL (ref 30.0–36.5)
MCV: 90.9 FL (ref 80.0–99.0)
MONOCYTES: 16 % — ABNORMAL HIGH (ref 5–13)
NEUTROPHILS: 68 % (ref 32–75)
PLATELET: 216 10*3/uL (ref 150–400)
RBC: 4.07 M/uL — ABNORMAL LOW (ref 4.10–5.70)
RDW: 13.1 % (ref 11.5–14.5)
WBC: 7.7 10*3/uL (ref 4.1–11.1)

## 2014-11-15 LAB — METABOLIC PANEL, BASIC
Anion gap: 9 mmol/L (ref 5–15)
BUN/Creatinine ratio: 20 (ref 12–20)
BUN: 19 MG/DL (ref 6–20)
CO2: 28 mmol/L (ref 21–32)
Calcium: 8.1 MG/DL — ABNORMAL LOW (ref 8.5–10.1)
Chloride: 99 mmol/L (ref 97–108)
Creatinine: 0.97 MG/DL (ref 0.70–1.30)
GFR est AA: >60 mL/min/{1.73_m2} (ref >60)
GFR est non-AA: >60 mL/min/{1.73_m2} (ref >60)
Glucose: 88 mg/dL (ref 65–100)
Potassium: 3.1 mmol/L — ABNORMAL LOW (ref 3.5–5.1)
Sodium: 136 mmol/L (ref 136–145)

## 2014-11-15 LAB — EKG, 12 LEAD, SUBSEQUENT
Atrial Rate: 91 {beats}/min
Calculated P Axis: 67 degrees
Calculated R Axis: 95 degrees
Calculated T Axis: 29 degrees
P-R Interval: 152 ms
Q-T Interval: 400 ms
QRS Duration: 140 ms
QTC Calculation (Bezet): 492 ms
Ventricular Rate: 91 {beats}/min

## 2014-11-15 LAB — MAGNESIUM: Magnesium: 2.2 mg/dL (ref 1.6–2.4)

## 2014-11-15 MED ORDER — POTASSIUM CHLORIDE SR 10 MEQ TAB
10 mEq | ORAL | Status: AC
Start: 2014-11-15 — End: 2014-11-15
  Administered 2014-11-15: 14:00:00 via ORAL

## 2014-11-15 MED ORDER — ASPIRIN 81 MG TAB, DELAYED RELEASE
81 mg | ORAL_TABLET | Freq: Every day | ORAL | Status: AC
Start: 2014-11-15 — End: ?

## 2014-11-15 MED ORDER — NITROGLYCERIN IN D5W 200 MCG/ML IV
50 mg/2 mL (200 mcg/mL) | INTRAVENOUS | Status: DC
Start: 2014-11-15 — End: 2014-11-15
  Administered 2014-11-15: 01:00:00 via INTRAVENOUS

## 2014-11-15 MED ORDER — ATORVASTATIN 20 MG TAB
20 mg | ORAL_TABLET | Freq: Every day | ORAL | Status: AC
Start: 2014-11-15 — End: ?

## 2014-11-15 MED ORDER — CARVEDILOL 25 MG TAB
25 mg | ORAL_TABLET | Freq: Two times a day (BID) | ORAL | Status: AC
Start: 2014-11-15 — End: ?

## 2014-11-15 MED ORDER — TICAGRELOR 90 MG TAB
90 mg | ORAL_TABLET | Freq: Two times a day (BID) | ORAL | Status: AC
Start: 2014-11-15 — End: ?

## 2014-11-15 MED ORDER — FUROSEMIDE 40 MG TAB
40 mg | ORAL_TABLET | Freq: Every day | ORAL | Status: AC
Start: 2014-11-15 — End: ?

## 2014-11-15 MED ORDER — POTASSIUM CHLORIDE ER 20 MEQ TABLET,EXTENDED RELEASE
20 mEq | ORAL_TABLET | ORAL | Status: AC
Start: 2014-11-15 — End: 2014-11-15

## 2014-11-15 MED ORDER — LISINOPRIL 2.5 MG TAB
2.5 mg | ORAL_TABLET | Freq: Every day | ORAL | Status: AC
Start: 2014-11-15 — End: ?

## 2014-11-15 MED FILL — BD POSIFLUSH NORMAL SALINE 0.9 % INJECTION SYRINGE: INTRAMUSCULAR | Qty: 10

## 2014-11-15 MED FILL — POTASSIUM CHLORIDE SR 10 MEQ TAB: 10 mEq | ORAL | Qty: 4

## 2014-11-15 MED FILL — BRILINTA 90 MG TABLET: 90 mg | ORAL | Qty: 1

## 2014-11-15 MED FILL — CARVEDILOL 12.5 MG TAB: 12.5 mg | ORAL | Qty: 3

## 2014-11-15 MED FILL — LOPERAMIDE 2 MG CAP: 2 mg | ORAL | Qty: 1

## 2014-11-15 MED FILL — FUROSEMIDE 40 MG TAB: 40 mg | ORAL | Qty: 1

## 2014-11-15 MED FILL — ASPIRIN 81 MG TAB, DELAYED RELEASE: 81 mg | ORAL | Qty: 1

## 2014-11-15 MED FILL — LISINOPRIL 5 MG TAB: 5 mg | ORAL | Qty: 1

## 2014-11-15 NOTE — Progress Notes (Addendum)
Bedside shift change report given to Cathlean CowerLesley, Charity fundraiserN (Cabin crewoncoming nurse) by [place], RN (offgoing nurse). Report included the following information SBAR, Kardex, Intake/Output, MAR, Recent Results and Cardiac Rhythm NSR.     Discharge instructions reviewed with pt using teach-back method. Pt was able to verbalize understanding of follow up appointments, medications, diet and activity orders. Opportunity for questions provided. PIV removed without difficulty. Pt left via wheelchair to main entrance.

## 2014-11-15 NOTE — Progress Notes (Signed)
Bedside shift change report given to [place] RN (Cabin crewoncoming nurse). Report included the following information SBAR.

## 2014-11-15 NOTE — Other (Signed)
Cardiopulmonary Rehab:    Chart reviewed. Pt is a 63 y.o. male admitted with STEMI/cardiac arrest, now S/P PCI to LAD. LVEF 15-20% on echo 11/11/14. Current smoker - up to two packs a day. Pt resides in [place]. Pt was given Post MI, CHF, Post Cath, and Smoking Cessation information yesterday as well as information on Life Vest.     This was a follow up appointment to see if Pt or his wife had any additional questions.  His wife was teary eyed stating there were some issues re: having the medical records transferred to Cardiologist in [place] but have been resolved. Provided her emotional comfort and reassurance.   Reviewed the need for daily weights, staying within 1500 mg of NA per day.  Importance of medication compliance.  Signs and symptoms to monitor for and when to call MD.     Pt and wife without questions and demonstrated understanding.

## 2014-11-15 NOTE — Discharge Summary (Addendum)
[address], [place], [place] [id number]  [phone]         Cardiology Discharge Summary     Patient ID:  Todd Cline  [id number]  63 y.o.  [birth year]    Admit Date: 11/10/2014    Discharge Date: 11/15/2014     Admitting Physician: Pindipapanahall Beckie Saltsavindra V, MD     Discharge Physician: Colin BentonATHERINE C MURPHY, NP    Admission Diagnoses:   ST elevation myocardial infarction (STEMI) involving left anterior descending (LAD) coronary artery with complication [facility](HCC)    Discharge Diagnoses:   Principal Problem:    STEMI (ST elevation myocardial infarction) (HCC) (11/10/2014)    Active Problems:    Ventricular fibrillation (HCC) (11/10/2014)      ST elevation myocardial infarction (STEMI) involving left anterior descending (LAD) coronary artery with complication (HCC) (11/11/2014)      Systolic CHF, acute (HCC) (11/11/2014)      Cardiac arrest (HCC) (11/11/2014)        Discharge Condition: Good    Cardiology Procedures this Admission:  Left heart catheterization with PCI  EchoCardiogram    Hospital Course: Todd Cline is a 63 y.o. male admitted for STEMI/cardiac arrest. Patient presents via EMS to [facility] ED c/o 6/10 mid-sternal CP x30-45 min PTA that he describes as a tightness. He states the pain has been gradually improving in the ED and notes the pain "felt like indigestion". He reports additional sxs of SOB, nausea, and diaphoresis. EMS reports the pt developed ventricular fibrillation, unresponsive en route to ED after which he was shocked once into sinus bradycardia in the 20's and then paced, regaining consciousness. EMS reports they were unable to perform an EKG due to equipment failure . EKG in the ED revealed anterolateral ST elevation. Patient notes intermittent chest pains for last 1-2 years, does not see physicians.     STEMI:  Urgent cardiac cath with PCI/DES to LAD.  Started on ASA 81mg  daily for life, Brilinta 90mg  BID x 1 year, Coreg increased to 37.5mg  BID  for CAD and HR control, BP tolerating, Lipitor 20mg  daily.  Ambulating without difficulty, SOB resolving.  Anxious about future.    Acute systolic HF:  EF 10%20% by echo.  Started on Lisinopril 2.5mg  daily as BP allows, Lasix 40mg  daily, and supplemental KCL as he was hypokalemic.  Lifevest was applied to patient.  Recheck echo in 3 months.  If EF < 35% at that time, he will require an AICD.  Weight on admission 193 #, at discharge 191#.  Cardiac Rehab has provided information on s/s of worsening HF, management of HF with daily weights, diet, exercise.    Cardiac arrest/VF:  No further arrhythmias post PCI.      Recommending that patient f/u with cardiologist in [place], [place] and to attend a Cardiac Rehab program.     Consults: Cardiac Rehab, Case Management    Visit Vitals   Item Reading   ??? BP 102/55 mmHg   ??? Pulse 85   ??? Temp 98.5 ??F (36.9 ??C)   ??? Resp 16   ??? Ht 6\' 3"  (1.905 m)   ??? Wt 86.7 kg (191 lb 2.2 oz)   ??? BMI 23.89 kg/m2   ??? SpO2 98%       Physical Exam  Abdomen: soft, non-tender. Bowel sounds normal.   Extremities: extremities normal, atraumatic, no cyanosis or edema  Heart: regular rate and rhythm, no murmur, click, rub or gallop  Lungs:   Neck: supple, symmetrical, trachea midline, no  adenopathy, thyroid: not enlarged, symmetric, no tenderness/mass/nodules, no carotid bruit and no JVD  Neurologic: Grossly normal  Pulses: 2+ and symmetrical    Labs:   Recent Labs      11/15/14   0324   WBC  7.7   HGB  12.5   HCT  37.0   PLT  216     Recent Labs      11/15/14   0324  11/13/14   1017   NA  136   --    K  3.1*   --    CL  99   --    CO2  28   --    GLU  88   --    BUN  19   --    CREA  0.97   --    CA  8.1*   --    MG  2.2  2.3       No results for input(s): TROIQ, CPK, CKMB in the last 72 hours.    EKG: SR  Cxray: on admission mild pulm edema  Echo:  Left ventricle: Size was at the upper limits of normal. Systolic function  was severely reduced. Ejection fraction was estimated to be 20 %. There   was severe diffuse hypokinesis. Wall thickness was mildly increased.    Mitral valve: There was mild regurgitation.      Disposition: home    Patient Instructions:   Current Discharge Medication List      START taking these medications    Details   aspirin delayed-release 81 mg tablet Take 1 Tab by mouth daily.  Qty: 30 Tab, Refills: 11      carvedilol (COREG) 25 mg tablet Take 1.5 Tabs by mouth two (2) times daily (with meals). Indications: LEFT VENTRICULAR DYSFUNCTION FOLLOWING MI  Qty: 90 Tab, Refills: 11      furosemide (LASIX) 40 mg tablet Take 1 Tab by mouth daily.  Qty: 30 Tab, Refills: 11      lisinopril (PRINIVIL, ZESTRIL) 2.5 mg tablet Take 1 Tab by mouth daily.  Qty: 30 Tab, Refills: 11      potassium chloride SR 20 mEq TbER Take 40 mEq by mouth now for 1 dose.  Qty: 30 Tab, Refills: 11      ticagrelor (BRILINTA) 90 mg tablet Take 1 Tab by mouth two (2) times a day.  Qty: 60 Tab, Refills: 11      atorvastatin (LIPITOR) 20 mg tablet Take 1 Tab by mouth daily.  Qty: 30 Tab, Refills: 11             Referenced discharge instructions provided by nursing for diet and activity.    Follow up [place] and with PCP within 1-2 weeks.    Signed:  Colin BentonATHERINE C MURPHY, NP  11/15/2014  7:41 AM       [facility]    11/15/2014         Agree with note as outlined by  NP. I confirm findings in history and physical exam. No additional findings noted. Agree with plan as outlined above.     WVPXTGGYIRSWNIOPINDIPAPANAHALL Beckie SaltsV Marvens Hollars, MD

## 2014-11-16 ENCOUNTER — Emergency Department (HOSPITAL_COMMUNITY): Payer: BC Managed Care – PPO

## 2014-11-16 ENCOUNTER — Encounter (HOSPITAL_COMMUNITY): Payer: Self-pay

## 2014-11-16 ENCOUNTER — Inpatient Hospital Stay (HOSPITAL_COMMUNITY)
Admission: EM | Admit: 2014-11-16 | Discharge: 2014-11-17 | DRG: 280 | Disposition: A | Discharge status: Home / Self Care | Payer: BC Managed Care – PPO | Attending: Cardiology | Admitting: Cardiology

## 2014-11-16 DIAGNOSIS — F17210 Nicotine dependence, cigarettes, uncomplicated: Secondary | ICD-10-CM | POA: Diagnosis present

## 2014-11-16 DIAGNOSIS — I10 Essential (primary) hypertension: Secondary | ICD-10-CM | POA: Diagnosis present

## 2014-11-16 DIAGNOSIS — Z79899 Other long term (current) drug therapy: Secondary | ICD-10-CM | POA: Diagnosis not present

## 2014-11-16 DIAGNOSIS — Z9861 Coronary angioplasty status: Secondary | ICD-10-CM

## 2014-11-16 DIAGNOSIS — I5021 Acute systolic (congestive) heart failure: Secondary | ICD-10-CM

## 2014-11-16 DIAGNOSIS — R0600 Dyspnea, unspecified: Secondary | ICD-10-CM

## 2014-11-16 DIAGNOSIS — I4729 Other ventricular tachycardia: Secondary | ICD-10-CM

## 2014-11-16 DIAGNOSIS — Z8249 Family history of ischemic heart disease and other diseases of the circulatory system: Secondary | ICD-10-CM | POA: Diagnosis not present

## 2014-11-16 DIAGNOSIS — I2510 Atherosclerotic heart disease of native coronary artery without angina pectoris: Secondary | ICD-10-CM | POA: Diagnosis present

## 2014-11-16 DIAGNOSIS — I2102 ST elevation (STEMI) myocardial infarction involving left anterior descending coronary artery: Secondary | ICD-10-CM

## 2014-11-16 DIAGNOSIS — Z7982 Long term (current) use of aspirin: Secondary | ICD-10-CM | POA: Diagnosis not present

## 2014-11-16 DIAGNOSIS — I255 Ischemic cardiomyopathy: Secondary | ICD-10-CM | POA: Diagnosis present

## 2014-11-16 DIAGNOSIS — Z7902 Long term (current) use of antithrombotics/antiplatelets: Secondary | ICD-10-CM | POA: Diagnosis not present

## 2014-11-16 DIAGNOSIS — R079 Chest pain, unspecified: Secondary | ICD-10-CM

## 2014-11-16 DIAGNOSIS — E785 Hyperlipidemia, unspecified: Secondary | ICD-10-CM | POA: Diagnosis present

## 2014-11-16 DIAGNOSIS — I4510 Unspecified right bundle-branch block: Secondary | ICD-10-CM | POA: Diagnosis present

## 2014-11-16 DIAGNOSIS — I228 Subsequent ST elevation (STEMI) myocardial infarction of other sites: Secondary | ICD-10-CM | POA: Diagnosis present

## 2014-11-16 DIAGNOSIS — I472 Ventricular tachycardia: Principal | ICD-10-CM | POA: Diagnosis present

## 2014-11-16 DIAGNOSIS — Z955 Presence of coronary angioplasty implant and graft: Secondary | ICD-10-CM | POA: Diagnosis not present

## 2014-11-16 DIAGNOSIS — E0781 Sick-euthyroid syndrome: Secondary | ICD-10-CM | POA: Diagnosis present

## 2014-11-16 DIAGNOSIS — R52 Pain, unspecified: Secondary | ICD-10-CM

## 2014-11-16 DIAGNOSIS — I4901 Ventricular fibrillation: Secondary | ICD-10-CM

## 2014-11-16 DIAGNOSIS — R7989 Other specified abnormal findings of blood chemistry: Secondary | ICD-10-CM

## 2014-11-16 DIAGNOSIS — R0609 Other forms of dyspnea: Secondary | ICD-10-CM | POA: Diagnosis present

## 2014-11-16 HISTORY — DX: Hyperlipidemia, unspecified: E78.5

## 2014-11-16 HISTORY — DX: Unspecified right bundle-branch block: I45.10

## 2014-11-16 HISTORY — DX: Other specified abnormal findings of blood chemistry: R79.89

## 2014-11-16 HISTORY — DX: Ventricular fibrillation: I49.01

## 2014-11-16 HISTORY — DX: Atherosclerotic heart disease of native coronary artery without angina pectoris: I25.10

## 2014-11-16 HISTORY — DX: Ventricular tachycardia: I47.2

## 2014-11-16 HISTORY — DX: Tobacco use: Z72.0

## 2014-11-16 HISTORY — DX: Essential (primary) hypertension: I10

## 2014-11-16 HISTORY — DX: Dyspnea, unspecified: R06.00

## 2014-11-16 HISTORY — DX: Other ventricular tachycardia: I47.29

## 2014-11-16 HISTORY — DX: Ischemic cardiomyopathy: I25.5

## 2014-11-16 LAB — EKG, 12 LEAD, SUBSEQUENT
Atrial Rate: 81 {beats}/min
Calculated P Axis: 66 degrees
Calculated R Axis: 110 degrees
Calculated T Axis: 11 degrees
Diagnosis: NORMAL
P-R Interval: 156 ms
Q-T Interval: 422 ms
QRS Duration: 140 ms
QTC Calculation (Bezet): 490 ms
Ventricular Rate: 81 {beats}/min

## 2014-11-16 LAB — CULTURE, STOOL
Campylobacter antigen: NEGATIVE
Shiga toxin-producing E. coli Ag: NEGATIVE

## 2014-11-16 LAB — I-STAT TROPONIN, ED: Troponin i, poc: 7.35 ng/mL (ref 0.00–0.08)

## 2014-11-16 LAB — CBC
HCT: 37 % — ABNORMAL LOW (ref 39.0–52.0)
HEMATOCRIT: 37.8 % — AB (ref 39.0–52.0)
Hemoglobin: 13 g/dL (ref 13.0–17.0)
Hemoglobin: 12.9 g/dL — ABNORMAL LOW (ref 13.0–17.0)
MCH: 32 pg (ref 26.0–34.0)
MCH: 31.3 pg (ref 26.0–34.0)
MCHC: 34.4 g/dL (ref 30.0–36.0)
MCHC: 34.9 g/dL (ref 30.0–36.0)
MCV: 93.1 fL (ref 78.0–100.0)
MCV: 89.8 fL (ref 78.0–100.0)
PLATELETS: 211 10*3/uL (ref 150–400)
Platelets: 253 10*3/uL (ref 150–400)
RBC: 4.06 MIL/uL — AB (ref 4.22–5.81)
RBC: 4.12 MIL/uL — ABNORMAL LOW (ref 4.22–5.81)
RDW: 12.8 % (ref 11.5–15.5)
RDW: 12.8 % (ref 11.5–15.5)
WBC: 7.5 10*3/uL (ref 4.0–10.5)
WBC: 7.9 10*3/uL (ref 4.0–10.5)

## 2014-11-16 LAB — BASIC METABOLIC PANEL
Anion gap: 16 — ABNORMAL HIGH (ref 5–15)
BUN: 20 mg/dL (ref 6–23)
CALCIUM: 9.3 mg/dL (ref 8.4–10.5)
CO2: 23 meq/L (ref 19–32)
CREATININE: 0.84 mg/dL (ref 0.50–1.35)
Chloride: 96 mEq/L (ref 96–112)
GFR calc Af Amer: >90 mL/min (ref >90)
GFR calc non Af Amer: >90 mL/min (ref >90)
Glucose, Bld: 122 mg/dL — ABNORMAL HIGH (ref 70–99)
Potassium: 4.2 mEq/L (ref 3.7–5.3)
Sodium: 135 mEq/L — ABNORMAL LOW (ref 137–147)

## 2014-11-16 LAB — CK TOTAL AND CKMB (NOT AT ARMC)
CK, MB: 4 ng/mL (ref 0.3–4.0)
RELATIVE INDEX: 1.8 (ref 0.0–2.5)
Total CK: 218 U/L (ref 7–232)

## 2014-11-16 LAB — CREATININE, SERUM
Creatinine, Ser: 0.92 mg/dL (ref 0.50–1.35)
GFR calc Af Amer: >90 mL/min (ref >90)
GFR calc non Af Amer: 87 mL/min — ABNORMAL LOW (ref >90)

## 2014-11-16 LAB — TROPONIN I
TROPONIN I: 2.26 ng/mL — AB (ref <0.30)
Troponin I: 2.31 ng/mL (ref <0.30)
Troponin I: 2.44 ng/mL (ref <0.30)

## 2014-11-16 LAB — PRO B NATRIURETIC PEPTIDE: Pro B Natriuretic peptide (BNP): 7684 pg/mL — ABNORMAL HIGH (ref 0–125)

## 2014-11-16 LAB — TSH: TSH: 10.53 u[IU]/mL — ABNORMAL HIGH (ref 0.350–4.500)

## 2014-11-16 LAB — MRSA PCR SCREENING: MRSA BY PCR: NEGATIVE

## 2014-11-16 LAB — MAGNESIUM: Magnesium: 2.2 mg/dL (ref 1.5–2.5)

## 2014-11-16 MED ORDER — ZOLPIDEM TARTRATE 5 MG PO TABS
5.0000 mg | ORAL_TABLET | Freq: Every evening | ORAL | Status: DC | PRN
Start: 2014-11-16 — End: 2014-11-17
  Administered 2014-11-16 – 2014-11-17 (×2): 5 mg via ORAL
  Filled 2014-11-16 (×2): qty 1

## 2014-11-16 MED ORDER — ALPRAZOLAM 0.25 MG PO TABS
0.2500 mg | ORAL_TABLET | Freq: Two times a day (BID) | ORAL | Status: DC | PRN
  Administered 2014-11-16: 0.25 mg via ORAL
  Filled 2014-11-16: qty 1

## 2014-11-16 MED ORDER — ASPIRIN EC 81 MG PO TBEC
81.0000 mg | DELAYED_RELEASE_TABLET | Freq: Every day | ORAL | Status: DC
  Administered 2014-11-17: 81 mg via ORAL
  Filled 2014-11-16: qty 1

## 2014-11-16 MED ORDER — PRASUGREL HCL 10 MG PO TABS
30.0000 mg | ORAL_TABLET | Freq: Once | ORAL | Status: AC
  Administered 2014-11-16: 30 mg via ORAL
  Filled 2014-11-16: qty 3

## 2014-11-16 MED ORDER — TICAGRELOR 90 MG PO TABS: 90.0000 mg | ORAL_TABLET | Freq: Two times a day (BID) | ORAL | Status: DC

## 2014-11-16 MED ORDER — NICOTINE 14 MG/24HR TD PT24
14.0000 mg | MEDICATED_PATCH | Freq: Every day | TRANSDERMAL | Status: DC
  Administered 2014-11-16 – 2014-11-17 (×2): 14 mg via TRANSDERMAL
  Filled 2014-11-16 (×2): qty 1

## 2014-11-16 MED ORDER — FUROSEMIDE 10 MG/ML IJ SOLN: 40.0000 mg | Freq: Once | INTRAMUSCULAR | Status: DC

## 2014-11-16 MED ORDER — PRASUGREL HCL 10 MG PO TABS
10.0000 mg | ORAL_TABLET | Freq: Every day | ORAL | Status: DC
  Administered 2014-11-17: 10 mg via ORAL
  Filled 2014-11-16: qty 1

## 2014-11-16 MED ORDER — NITROGLYCERIN 0.4 MG SL SUBL: 0.4000 mg | SUBLINGUAL_TABLET | SUBLINGUAL | Status: DC | PRN

## 2014-11-16 MED ORDER — FUROSEMIDE 10 MG/ML IJ SOLN
40.0000 mg | Freq: Once | INTRAMUSCULAR | Status: AC
  Administered 2014-11-16: 40 mg via INTRAVENOUS
  Filled 2014-11-16: qty 4

## 2014-11-16 MED ORDER — ASPIRIN EC 81 MG PO TBEC
243.0000 mg | DELAYED_RELEASE_TABLET | Freq: Once | ORAL | Status: AC
  Administered 2014-11-16: 243 mg via ORAL
  Filled 2014-11-16: qty 3

## 2014-11-16 MED ORDER — ONDANSETRON HCL 4 MG/2ML IJ SOLN: 4.0000 mg | Freq: Four times a day (QID) | INTRAMUSCULAR | Status: DC | PRN

## 2014-11-16 MED ORDER — HEPARIN SODIUM (PORCINE) 5000 UNIT/ML IJ SOLN
5000.0000 [IU] | Freq: Three times a day (TID) | INTRAMUSCULAR | Status: DC
  Administered 2014-11-16 – 2014-11-17 (×3): 5000 [IU] via SUBCUTANEOUS
  Filled 2014-11-16 (×3): qty 1

## 2014-11-16 MED ORDER — FUROSEMIDE 40 MG PO TABS
40.0000 mg | ORAL_TABLET | Freq: Every day | ORAL | Status: DC
  Administered 2014-11-17: 40 mg via ORAL
  Filled 2014-11-16: qty 1

## 2014-11-16 MED ORDER — ATORVASTATIN CALCIUM 20 MG PO TABS
20.0000 mg | ORAL_TABLET | Freq: Every day | ORAL | Status: DC
  Administered 2014-11-16: 20 mg via ORAL
  Filled 2014-11-16: qty 1

## 2014-11-16 MED ORDER — OXYCODONE-ACETAMINOPHEN 5-325 MG PO TABS
1.0000 | ORAL_TABLET | Freq: Once | ORAL | Status: AC
  Administered 2014-11-16: 1 via ORAL
  Filled 2014-11-16: qty 1

## 2014-11-16 MED ORDER — POTASSIUM CHLORIDE CRYS ER 20 MEQ PO TBCR
40.0000 meq | EXTENDED_RELEASE_TABLET | Freq: Every day | ORAL | Status: DC
  Administered 2014-11-17: 40 meq via ORAL
  Filled 2014-11-16: qty 2

## 2014-11-16 MED ORDER — CHLORHEXIDINE GLUCONATE CLOTH 2 % EX PADS: 6.0000 | MEDICATED_PAD | Freq: Every day | CUTANEOUS | Status: DC

## 2014-11-16 MED ORDER — LISINOPRIL 2.5 MG PO TABS: 2.5000 mg | ORAL_TABLET | Freq: Every day | ORAL | Status: DC

## 2014-11-16 MED ORDER — CARVEDILOL 25 MG PO TABS
37.5000 mg | ORAL_TABLET | Freq: Two times a day (BID) | ORAL | Status: DC
  Administered 2014-11-16 – 2014-11-17 (×2): 37.5 mg via ORAL
  Filled 2014-11-16: qty 2
  Filled 2014-11-16 (×3): qty 1

## 2014-11-16 MED ORDER — ACETAMINOPHEN 325 MG PO TABS: 650.0000 mg | ORAL_TABLET | ORAL | Status: DC | PRN

## 2014-11-16 NOTE — ED Notes (Signed)
MD with [facility] at bedside with patient.

## 2014-11-16 NOTE — ED Notes (Signed)
Reports he received cpr with MI

## 2014-11-16 NOTE — ED Notes (Signed)
Pt here for sob and cp, sts \\feels  anxious, reports MI in [place] last Friday. Reports oxygen relieves pain.

## 2014-11-16 NOTE — ED Provider Notes (Signed)
CSN: [id number]     Arrival date & time 11/16/14  0908 History   First MD Initiated Contact with Patient 11/16/14 0920     Chief Complaint  Patient presents with  . Chest Pain  . Shortness of Breath  . Anxiety     (Consider location/radiation/quality/duration/timing/severity/associated sxs/prior Treatment) HPI  Kyle Bullock is a 63 y.o. male with PMH of HTN, MI of LAD with PCI stent placement one week agocomplicated by cardiomyopathy with EF of 20%  discharged yesterday [facility] [place] presenting left lower side/chest pain that started anywhere from 4 AM to 7 AM this morning. Patient describes it as a dull ache and is only take and his hypertension and aspirin. He does not radiate. Patient states that he cannot "fill his lungs with air." Pt endorses SOB worse at night and has not been sleeping. This is chronic since initial event. Patient denies nausea, vomiting, diaphoresis, back pain, abdominal pain, headache, dizziness, lightheadedness, syncope. Patient currently on Brilinta, lisinopril, aspirin. Patient with cardiology outpatient follow-up scheduled for December 21 Dr. Mayford Knifeurner in [place]. Wife at bedside and concerned with anxiety or rib fracture since patient had CPR and ambulance on route to hospital. Patient with slight tach defibrillator on at this time. Patient endorses improvement with oxygen treatment. Pulse ox 100% in room. Patient with significant smoking history per wife. Patient without history of DVT, PE, surgery other than PCI, trauma, malignancy, unilateral leg swelling or tenderness. No fevers or change in chronic cough. Unproductive.  [facility] was [facility] that his PCP retired.   Past Medical History  Diagnosis Date  . Hypertension   . Myocardial infarction    History reviewed. No pertinent past surgical history. History reviewed. No pertinent family history. History  Substance Use Topics  . Smoking status:  Current Every Day Smoker  . Smokeless tobacco: Not on file  . Alcohol Use: Yes    Review of Systems  Constitutional: Negative for fever and chills.  HENT: Negative for congestion and rhinorrhea.   Eyes: Negative for visual disturbance.  Respiratory: Positive for cough and shortness of breath.   Cardiovascular: Positive for chest pain. Negative for leg swelling.  Gastrointestinal: Negative for nausea, vomiting and diarrhea.  Genitourinary: Negative for dysuria and hematuria.  Musculoskeletal: Negative for back pain and gait problem.  Skin: Negative for rash.  Neurological: Negative for weakness and headaches.      Allergies  Review of patient's allergies indicates not on file.  Home Medications   Prior to Admission medications   Not on File   BP 108/66 mmHg  Pulse 84  Resp 20  Ht 6\' 3"  (1.905 m)  Wt 194 lb (87.998 kg)  BMI 24.25 kg/m2  SpO2 98% Physical Exam  Constitutional: He appears well-developed and well-nourished. No distress.  HENT:  Head: Normocephalic and atraumatic.  Eyes: Conjunctivae and EOM are normal. Right eye exhibits no discharge. Left eye exhibits no discharge.  Neck: No JVD present.  Cardiovascular: Normal rate, regular rhythm and normal heart sounds.   No leg swelling or tenderness. Negative Homan's sign.  Pulmonary/Chest: Effort normal and breath sounds normal. No respiratory distress. He has no wheezes.  Abdominal: Soft. Bowel sounds are normal. He exhibits no distension. There is no tenderness.  Neurological: He is alert. He exhibits normal muscle tone. Coordination normal.  Skin: Skin is warm and dry. He is not diaphoretic.  Nursing note and vitals reviewed.   ED Course  Procedures (including critical care  time) Labs Review Labs Reviewed  CBC  BASIC METABOLIC PANEL  I-STAT TROPOININ, ED    Imaging Review No results found.   EKG Interpretation   Date/Time:  Thursday November 16 2014 09:14:03 EST Ventricular Rate:  84 PR  Interval:  148 QRS Duration: 136 QT Interval:  420 QTC Calculation: 496 R Axis:   104 Text Interpretation:  Sinus rhythm with frequent Premature ventricular  complexes Possible Left atrial enlargement Right bundle branch block  Anterolateral infarct , age undetermined Abnormal ECG Nonspecific T wave  abnormality No old tracing to compare Confirmed by GOLDSTON  MD, SCOTT  ([id number]) on 11/16/2014 9:27:34 AM      MDM   Final diagnoses:  Chest pain  Tenderness   Patient with recent PCI with LAD stent placement one week ago at [facility] [place] he was discharged yesterday. Patient presents today with mild left-sided tenderness and persistent shortness of breath. No increase or worsening. VSS. Equal breath sounds bilaterally. Chest pain reproducible on exam and has now resolved with 1 Percocet. Chest x-ray with mild interstitial edema. Troponin 7.3. Other lab work noncontributory. EKG with PVCs without ST elevation or depression. Consult to cardiology. Spoke with Dr. Donato SchultzMark Skains. He recommended obtaining a CK-MB and obtaining trending troponin levels from outside hospital. Cardiology will evaluate patient. Patient developed 10-15 seconds of SVT but maintain strong pulse stable BP and had no change in mental status or chest pain. Cardiology evaluated patient with plan to admit.  Discussed all results and patient verbalizes understanding and agrees with plan.  This is a shared patient. This patient was discussed with the physician, Dr. Criss AlvineGoldston who saw and evaluated the patient and agrees with the plan.      Louann SjogrenVictoria L Vaishali Baise, PA-C 11/16/14 1654  Louann SjogrenVictoria L Raeven Pint, PA-C 11/16/14 1656  Audree CamelScott T Goldston, MD 11/17/14 [phone]

## 2014-11-16 NOTE — ED Notes (Signed)
While at bedside, pt had 10 - 15 sec run of V-tach, maintained strong pulse and stable BP, no change in mental status or chest pain.  EDP updated and rhythm strip printed.

## 2014-11-16 NOTE — Consult Note (Addendum)
ELECTROPHYSIOLOGY CONSULT NOTE    Referring Physician:  Dr Excell Seltzerooper  Admit Date: 11/16/2014  Reason for consultation:  NSVT  Kyle Bullock is a 63 y.o. male with a h/o recent VF arrest 11/10/14 in the setting of acute anterior MI who now presents with symptoms of SOB.  He reports that he was in good health and visiting family in [place], [place] last week when he developed acute chest pain, nausea, and diaphoresis.  He called EMS.  In route to the hospital, he had a VF arrest for which he was successfully defibrillated.   EKG revealed RBBB with anterolateral ST elevation. Cath revealed total LAD at the takeoff of the Dx1 with 95% Dx1. He underwent Dx1 POBA and LAD DES with a Xience stent. His EF at cath and by echo was 20%.  He was initiated on appropriate medical therapy and discharged with a lifevest to establish with our practice upon return to his home in [place].  He drove from [place] to [place] yesterday.  He reports feeling week and tired.  He had occasional tacypalpitations with associated SOB.  He also reports SOB with exertion.  He bacame anxious and therefore presents to [facility].  IN the ER, he had 18 bt of NSVT (rate 135-140 bpm).  He is admitted for further management of his SOB.  Today, he denies symptoms of chest pain,orthopnea, PND, lower extremity edema, dizziness, presyncope, syncope, or neurologic sequela. The patient is tolerating medications without difficulties and is otherwise without complaint today.   Past Medical History  Diagnosis Date  . Hypertension   . Ischemic cardiomyopathy   . VF (ventricular fibrillation) 11/10/14    arrest with STEMI  . Smoker   . Myocardial infarction 11/10/2014  . NSVT (nonsustained ventricular tachycardia) 11/16/14    135-140 bpm   Past Surgical History  Procedure Laterality Date  . Coronary stent placement  11/10/14    LAD DES  . Hand surgery Bilateral   . Knee arthroscopy  1982    . [START ON  11/17/2014] aspirin EC  81 mg Oral Daily  . atorvastatin  20 mg Oral QHS  . carvedilol  37.5 mg Oral BID WC  . [START ON 11/17/2014] Chlorhexidine Gluconate Cloth  6 each Topical Q0600  . [START ON 11/17/2014] furosemide  40 mg Oral Daily  . heparin  5,000 Units Subcutaneous 3 times per day  . [START ON 11/17/2014] lisinopril  2.5 mg Oral Daily  . nicotine  14 mg Transdermal Daily  . [START ON 11/17/2014] potassium chloride SA  40 mEq Oral Daily  . [START ON 11/17/2014] prasugrel  10 mg Oral Daily      No Known Allergies  History   Social History  . Marital Status: Married    Spouse Name: N/A    Number of Children: N/A  . Years of Education: N/A   Occupational History  . Not on file.   Social History Main Topics  . Smoking status: Current Every Day Smoker -- 2.00 packs/day for 40 years    Types: Cigarettes  . Smokeless tobacco: Never Used  . Alcohol Use: 0.0 oz/week    0 Not specified per week     Comment: daily    wine   . Drug Use: No  . Sexual Activity: Not on file   Other Topics Concern  . 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  Now lives  in [place].    Family History  Problem Relation Age of Onset  . Hypertension      ROS- All systems are reviewed and negative except as per the HPI above  Physical Exam: Telemetry: Filed Vitals:   11/16/14 1430 11/16/14 1526 11/16/14 1700 11/16/14 1900  BP: 112/86 104/70    Pulse: 84 77    Temp:  97.6 F (36.4 C)    TempSrc:  Oral    Resp: 16 19 22 23   Height:  6\' 3"  (1.905 m)    Weight:  194 lb 6.4 oz (88.179 kg)    SpO2: 97%       GEN- The patient is anxious appearing, alert and oriented x 3 today.   Wearing his lifevest Head- normocephalic, atraumatic Eyes-  Sclera clear, conjunctiva pink Ears- hearing intact Oropharynx- clear Neck- supple, no JVP Lymph- no cervical lymphadenopathy Lungs- Clear to ausculation bilaterally, normal work of  breathing Heart- Regular rate and rhythm, no murmurs, rubs or gallops, PMI not laterally displaced GI- soft, NT, ND, + BS Extremities- no clubbing, cyanosis, or edema MS- no significant deformity or atrophy Skin- no rash or lesion Psych- euthymic mood, full affect Neuro- strength and sensation are intact  EKG reveals sinus rhythm with RBBB, frequent PVCs  Labs:   Lab Results  Component Value Date   WBC 7.9 11/16/2014   HGB 12.9* 11/16/2014   HCT 37.0* 11/16/2014   MCV 89.8 11/16/2014   PLT 253 11/16/2014    Recent Labs Lab 11/16/14 0950 11/16/14 1435  NA 135*  --   K 4.2  --   CL 96  --   CO2 23  --   BUN 20  --   CREATININE 0.84 0.92  CALCIUM 9.3  --   GLUCOSE 122*  --    Lab Results  Component Value Date   CKTOTAL 218 11/16/2014   CKMB 4.0 11/16/2014   TROPONINI 2.31* 11/16/2014   TROPONINI 2.44* 11/16/2014   No results found for: CHOL No results found for: HDL No results found for: LDLCALC No results found for: TRIG No results found for: CHOLHDL No results found for: LDLDIRECT    Lifevest interrogation: manual download is performed and reveals no detected arrhythmias.  Appropriate device programming with VT zone above 150 bpm is confirmed.  ASSESSMENT AND PLAN:   1. Ischemic CM, recent VF arrest in setting of anterior MI, NSVT  I have reviewed the patients epic chart and discussed with both Dr Excell Seltzer and also Corine Shelter.  He has had NSVT for which he has been minimally symptomatic.  He has had no hemodynamically unstable arrhythmias or syncope post arrest.  The patient is presently on an optimal medical regimen.  I have reviewed the lifevest and this reveals no arrhythmias > 150 bpm and no therapies delivered.  At this time, I would advise that we continue our current medical regimen.  Keep K >3.9 and Mg >1.9. No driving x 6 months The importance of compliance with lifevest was discussed today.  Though we could consider EP study and possible ICD if  inducible ventricular arrhythmias are found, I think that a more conservative approach would be to continue medical therapy with lifevest in place and then reassess EF after 90 days post revascularization.  The patient would prefer this approach also.  Observe overnight on telemetry.  If no significant arrhythmias, would anticipate discharge in am. He should follow closely with general cardiology and proceed to cardiac rehab.  EP to see  in 4 weeks in the office for lifevest interrogation and management.   Electrophysiology team to see as needed while here. Please call with questions.   Hillis RangeAllred, Sandrea Boer, MD 11/16/2014  7:58 PM

## 2014-11-16 NOTE — ED Notes (Signed)
Pt reports that he went to the hospital on Friday for chest pain and had an MI en route and cardiac arrested. Pt was kept in the hospital until yesterday and dc'ed home. Reports that he did not sleep well last night and feels like he can not catch his breath. Pt has a life pack defibrillator on at this time.

## 2014-11-16 NOTE — Plan of Care (Signed)
Problem: Phase I Progression Outcomes Goal: Hemodynamically stable Outcome: Completed/Met Date Met:  11/16/14 Goal: Anginal pain relieved Outcome: Completed/Met Date Met:  11/16/14 Goal: Aspirin unless contraindicated Outcome: Completed/Met Date Met:  11/16/14

## 2014-11-16 NOTE — ED Notes (Signed)
Troponin results given to Dr. Goldston 

## 2014-11-16 NOTE — H&P (Signed)
Patient ID: Kyle Bullock MRN: [id number], DOB/AGE: [birth year]   Admit date: 11/16/2014   Primary Physician: No primary care provider on file. Primary Cardiologist: New - Dr Excell Seltzer  HPI:  63 y/o male who previously "avoided doctors". He has been a 1-2 pk a day smoker. He and his wife were in [place] visiting on 11/10/14 when he developed chest pain. EMS was called. En route to [facility] he had a VF arrest. He was shocked x 1 to NSR. In the ER STEMI was called, EKG revealed RBBB with AL ST elevation. Urgent cath revealed total LAD at the takeoff of the Dx1 with 95% Dx1. He underwent Dx1 POBA and LAD DES with a Xience stent. His EF at cath and by echo was 20%. His post MI course was unremarkable. He was discharged 11/15/14 on ASA, Brilinta, Coreg, Lasix, Lipitor,  Zerstril 2.5 mg. and a Life Vest. Last night he didn't sleep well with intermittent SOB followed by anxiety. He has not had pain like he had pre MI. In the ER he had 18 bt of NSVT- his Life Vest did not fire. He has intermittent SOB- ? Worse when he is flat.    Problem List: Past Medical History  Diagnosis Date  . Hypertension   . Myocardial infarction 12/04/156  . Ischemic cardiomyopathy   . VF (ventricular fibrillation) 11/10/14    arrest with STEMI  . Smoker     Past Surgical History  Procedure Laterality Date  . Coronary stent placement  11/10/14    LAD DES     Allergies: No Known Allergies   Home Medications Current Facility-Administered Medications  Medication Dose Route Frequency Provider Last Rate Last Dose  . furosemide (LASIX) injection 40 mg  40 mg Intravenous Once Audree Camel, MD       Current Outpatient Prescriptions  Medication Sig Dispense Refill  . aspirin EC 81 MG tablet Take 81 mg by mouth daily.    Marland Kitchen atorvastatin (LIPITOR) 20 MG tablet Take 20 mg by mouth at bedtime.    . carvedilol (COREG) 25 MG tablet Take 37.5 mg by mouth 2 (two) times daily with a meal.      . furosemide (LASIX) 40 MG tablet Take 40 mg by mouth daily.    Marland Kitchen lisinopril (PRINIVIL,ZESTRIL) 2.5 MG tablet Take 2.5 mg by mouth daily.    . potassium chloride SA (K-DUR,KLOR-CON) 20 MEQ tablet Take 40 mEq by mouth daily.    . ticagrelor (BRILINTA) 90 MG TABS tablet Take 90 mg by mouth 2 (two) times daily.       History reviewed. No pertinent family history.  No FM Hx of CAD   History   Social History  . Marital Status: Married    Spouse Name: N/A    Number of Children: N/A  . Years of Education: N/A   Occupational History  . Not on file.   Social History Main Topics  . Smoking status: Current Every Day Smoker  . Smokeless tobacco: Not on file  . Alcohol Use: Yes  . Drug Use: No  . Sexual Activity: Not on file   Other Topics Concern  . Not on file   Social History Narrative  . No narrative on file     Review of Systems: General: negative for chills, fever, night sweats or weight changes.  Cardiovascular: negative for chest pain, dyspnea on exertion, edema, orthopnea, palpitations, paroxysmal nocturnal dyspnea or shortness of breath Dermatological: negative for  rash Respiratory: negative for cough or wheezing Urologic: negative for hematuria Abdominal: negative for nausea, vomiting, diarrhea, bright red blood per rectum, melena, or hematemesis Neurologic: negative for visual changes, syncope, or dizziness All other systems reviewed and are otherwise negative except as noted above.  Physical Exam: Blood pressure 112/80, pulse 81, resp. rate 20, height 6\' 3"  (1.905 m), weight 194 lb (87.998 kg), SpO2 97 %.  General appearance: alert, cooperative, no distress and pale Neck: no carotid bruit and no JVD Lungs: basliar crackles Heart: regular rate and rhythm Abdomen: soft, non-tender; bowel sounds normal; no masses,  no organomegaly Extremities: extremities normal, atraumatic, no cyanosis or edema and no Rt wrist hematoma Pulses: 2+ and symmetric Skin: Skin color,  texture, turgor normal. No rashes or lesions Neurologic: Grossly normal    Labs:   Results for orders placed or performed during the hospital encounter of 11/16/14 (from the past 24 hour(s))  CBC     Status: Abnormal   Collection Time: 11/16/14  9:50 AM  Result Value Ref Range   WBC 7.5 4.0 - 10.5 K/uL   RBC 4.06 (L) 4.22 - 5.81 MIL/uL   Hemoglobin 13.0 13.0 - 17.0 g/dL   HCT 16.137.8 (L) 09.639.0 - 04.552.0 %   MCV 93.1 78.0 - 100.0 fL   MCH 32.0 26.0 - 34.0 pg   MCHC 34.4 30.0 - 36.0 g/dL   RDW 40.912.8 81.111.5 - 91.415.5 %   Platelets 211 150 - 400 K/uL  Basic metabolic panel     Status: Abnormal   Collection Time: 11/16/14  9:50 AM  Result Value Ref Range   Sodium 135 (L) 137 - 147 mEq/L   Potassium 4.2 3.7 - 5.3 mEq/L   Chloride 96 96 - 112 mEq/L   CO2 23 19 - 32 mEq/L   Glucose, Bld 122 (H) 70 - 99 mg/dL   BUN 20 6 - 23 mg/dL   Creatinine, Ser 7.820.84 0.50 - 1.35 mg/dL   Calcium 9.3 8.4 - 95.610.5 mg/dL   GFR calc non Af Amer >90 >90 mL/min   GFR calc Af Amer >90 >90 mL/min   Anion gap 16 (H) 5 - 15  I-stat troponin, ED ([facility])     Status: Abnormal   Collection Time: 11/16/14 10:18 AM  Result Value Ref Range   Troponin i, poc 7.35 (HH) 0.00 - 0.08 ng/mL   Comment NOTIFIED PHYSICIAN    Comment 3          Troponin I     Status: Abnormal   Collection Time: 11/16/14 10:49 AM  Result Value Ref Range   Troponin I 2.26 (HH) <0.30 ng/mL   CK total and CKMB (cardiac)     Status: None   Collection Time: 11/16/14 10:49 AM  Result Value Ref Range   Total CK 218 7 - 232 U/L   CK, MB 4.0 0.3 - 4.0 ng/mL   Relative Index 1.8 0.0 - 2.5     Radiology/Studies: Dg Chest 2 View  11/16/2014   CLINICAL DATA:  Chest pain and short of breath.  Left rib pain  EXAM: CHEST  2 VIEW  COMPARISON:  None.  FINDINGS: Cardiac enlargement. Diffusely increased lung markings which could be due to interstitial edema or chronic scarring. Small right effusion and Kerley B-lines are noted suggesting mild fluid overload.  Left coronary stent noted. Negative for pneumonia. Apical scarring bilaterally.  IMPRESSION: Cardiac enlargement with prominent interstitial markings and probable mild interstitial edema. There may be an  element of chronic lung disease present as well.   Electronically Signed   By: Marlan Palauharles  Clark M.D.   On: 11/16/2014 10:13   Dg Ribs Unilateral Left  11/16/2014   CLINICAL DATA:  Rib pain  EXAM: LEFT RIBS - 2 VIEW  COMPARISON:  None.  FINDINGS: No fracture or other bone lesions are seen involving the ribs.  IMPRESSION: Negative.   Electronically Signed   By: Marlan Palauharles  Clark M.D.   On: 11/16/2014 10:01    EKG:NSR PVCs ant/lat Qs  ASSESSMENT AND PLAN:   Principal Problem:   Dyspnea- ? Mild CHF, ? Brilinta  Active Problems:   STEMI 11/10/13   NSVT (nonsustained ventricular tachycardia)   VF arrest- shocked x 1 11/10/14   CAD S/P urgent LAD DES 11/10/14 [facility]   Ischemic cardiomyopathy- 20%-Life Vest   RBBB   Smoker   HTN (hypertension)   Dyslipidemia   PLAN: Admit, check Mg++, check BNP. Lasix 40 mg IV x 1. Recurrent NSVT on high dose Coreg concerning- will ask EP to see.    Deland PrettySigned, KILROY,LUKE K, PA-C 11/16/2014, 2:00 PM   Patient seen, examined. Available data reviewed. Agree with findings, assessment, and plan as outlined by Corine ShelterLuke Kilroy, PA-C. The patient is independently interviewed and examined. He is an alert, oriented male in no distress. Lungs are clear. Heart is regular rate and rhythm without an S3 gallop. Abdomen is soft and nontender. There is no peripheral edema. I carefully reviewed his history. He has had a recent large anterior infarction with severe LV dysfunction. He has now been noted to have a fairly prolonged episode of nonsustained VT even on good background medical therapy with high-dose beta blocker. He is wearing a LifeVest. Will ask for a formal EP evaluation. Question whether he should be considered for an early ICD or suppressive antiarrhythmic therapy  with amiodarone. With respect to his shortness of breath, I suspect there is a combination of congestive heart failure with interstitial edema seen on his chest x-ray, but also this may represent a side effect of Brilinta. We are going to change him to Effient with a 30 mg loading dose followed by 10 mg daily starting tomorrow. Otherwise he will be continued on his current medical therapy. We'll start him on a nicotine patch as well.  Tonny BollmanMichael Mohd Clemons, M.D. 11/16/2014 4:27 PM

## 2014-11-16 NOTE — ED Notes (Signed)
Provider at the bedside.  

## 2014-11-17 ENCOUNTER — Encounter (HOSPITAL_COMMUNITY): Payer: Self-pay | Admitting: Physician Assistant

## 2014-11-17 ENCOUNTER — Telehealth: Payer: Self-pay | Admitting: Internal Medicine

## 2014-11-17 DIAGNOSIS — R7989 Other specified abnormal findings of blood chemistry: Secondary | ICD-10-CM

## 2014-11-17 LAB — BASIC METABOLIC PANEL
Anion gap: 14 (ref 5–15)
BUN: 19 mg/dL (ref 6–23)
CO2: 28 mEq/L (ref 19–32)
Calcium: 9.2 mg/dL (ref 8.4–10.5)
Chloride: 96 mEq/L (ref 96–112)
Creatinine, Ser: 0.98 mg/dL (ref 0.50–1.35)
GFR calc Af Amer: >90 mL/min (ref >90)
GFR calc non Af Amer: 85 mL/min — ABNORMAL LOW (ref >90)
Glucose, Bld: 100 mg/dL — ABNORMAL HIGH (ref 70–99)
Potassium: 3.9 mEq/L (ref 3.7–5.3)
Sodium: 138 mEq/L (ref 137–147)

## 2014-11-17 LAB — TSH: TSH: 11.25 u[IU]/mL — ABNORMAL HIGH (ref 0.350–4.500)

## 2014-11-17 LAB — CBC
HCT: 37.7 % — ABNORMAL LOW (ref 39.0–52.0)
Hemoglobin: 12.8 g/dL — ABNORMAL LOW (ref 13.0–17.0)
MCH: 30.6 pg (ref 26.0–34.0)
MCHC: 34 g/dL (ref 30.0–36.0)
MCV: 90.2 fL (ref 78.0–100.0)
Platelets: 283 10*3/uL (ref 150–400)
RBC: 4.18 MIL/uL — ABNORMAL LOW (ref 4.22–5.81)
RDW: 12.8 % (ref 11.5–15.5)
WBC: 8.2 10*3/uL (ref 4.0–10.5)

## 2014-11-17 LAB — T4, FREE: Free T4: 1.21 ng/dL (ref 0.80–1.80)

## 2014-11-17 MED ORDER — NITROGLYCERIN 0.4 MG SL SUBL: 0.4000 mg | SUBLINGUAL_TABLET | SUBLINGUAL | Status: DC | PRN

## 2014-11-17 MED ORDER — LOSARTAN POTASSIUM 25 MG PO TABS
25.0000 mg | ORAL_TABLET | Freq: Every day | ORAL | Status: DC
  Administered 2014-11-17: 25 mg via ORAL
  Filled 2014-11-17: qty 1

## 2014-11-17 MED ORDER — NICOTINE 14 MG/24HR TD PT24: 14.0000 mg | MEDICATED_PATCH | Freq: Every day | TRANSDERMAL | Status: DC

## 2014-11-17 MED ORDER — ZOLPIDEM TARTRATE 10 MG PO TABS: 10.0000 mg | ORAL_TABLET | Freq: Every evening | ORAL | Status: DC | PRN

## 2014-11-17 MED ORDER — LOSARTAN POTASSIUM 25 MG PO TABS: 25.0000 mg | ORAL_TABLET | Freq: Every day | ORAL | Status: DC

## 2014-11-17 MED ORDER — PRASUGREL HCL 10 MG PO TABS: 10.0000 mg | ORAL_TABLET | Freq: Every day | ORAL | Status: DC

## 2014-11-17 MED ORDER — LIVING BETTER WITH HEART FAILURE BOOK: Freq: Once | Status: DC

## 2014-11-17 NOTE — Discharge Summary (Signed)
Discharge Summary   Patient ID: Kyle Bullock MRN: [id number], DOB/AGE: 14-Nov-1951 63 y.o. Admit date: 11/16/2014 D/C date:     11/17/2014  Primary Care Provider: No primary care provider on file. Primary Cardiologist: Excell Seltzer, EP - Allred  Primary Discharge Diagnoses:  1. NSVT 2. CAD s/p recent VF arrest/STEMI s/p Dx 1 POBA and DES to LAD  - elevated troponin this admission felt residual from prior infarct 3. Dyspnea - possibly due to CHF and Brilinta, changed to Effient 4. Ischemic cardiomyopathy EF 20% with possible mild acute systolic CHF 5. HTN 6. Tobacco abuse 7. RBBB 7. Dyslipidemia 8. Abnormal TSH with normal free T4, likely sick euthyroid syndrome  Hospital Course: Kyle Bullock is a 63 y/o M with history of recent CAD/VF arrest s/p PCI, ICM EF 20%, HTN, tobacco abuse, dyslipidemia who presented to [facility] with SOB and anxiety. He previously "avoided doctors". He has been a 1-2 pack a day smoker. He and his wife were in [place] visiting on 11/10/14 when he developed chest pain. EMS was called. En route to [facility] he had a VF arrest. He was shocked x 1 to NSR. In the ER STEMI was called, EKG revealed RBBB with AL ST elevation. Urgent cath revealed total LAD at the takeoff of the Dx1 with 95% Dx1. He underwent Dx1 POBA and LAD DES with a Xience stent. His EF at cath and by echo was 20%. His post MI course was unremarkable. He was discharged 11/15/14 on ASA, Brilinta, Coreg, Lasix, Lipitor, Zerstril 2.5 mg. and a Technical sales engineer. He lives here locally and had an appointment to establish care in our office next week.   The night before this admission, he didn't sleep well with intermittent SOB followed by anxiety. He did not have any recurrent CP like when he had his MI. In the ER here at [facility], he had 18 bt of NSVT (rate 135-140 bpm) - his Life Vest did not fire. Vitals were otherwise stable. He was not tachycardic, tachypnic or hypoxic. pBNP was 7684 so  his dyspnea was felt multifactorial from possible mild acute systolic CHF versus related to Brilinta. He was subsequently loaded with 30mg  Effient then started on 10mg  daily. He received 2 doses of 40mg  IV Lasix. CXR: Cardiac enlargement with prominent interstitial markings and probable mild interstitial edema, may be an element of chronic lung disease present as well. His NSVT was concerning given his high dose Coreg use. He was admitted for further evaluation. Troponins went from POC 7.35 but full set 2.26-2.44-2.31 - Dr. Excell Seltzer felt this was residual elevation from the recent infarct with an essentially flat trend.  Dr. Johney Frame with EP consulted on the case. He reviewed the lifevest which showed no arrhythmias > 150 bpm and no therapies delivered. At this time he recommended to keep K>3.9 and Mg>1.9. (K level 4.2, 3.9 this adm). The patient was instructed not to drive for 6 months. Dr. Johney Frame reported "Though we could consider EP study and possible ICD if inducible ventricular arrhythmias are found, I think that a more conservative approach would be to continue medical therapy with lifevest in place and then reassess EF after 90 days post revascularization. The patient would prefer this approach also." The patient was observed overnight and had only occasional PVCs but no further VT.   Additional recommendations made this admission:    Dr. Johney Frame recommended EP follow-up in 4 weeks for Lifevest interrogation and management  Lisnopril changed to low-dose  losartan because of a continued nonproductive cough  Prescribe nicotine patch 14 mg daily  Prescribe Ambien 10 mg as needed for sleep, #12, no refills  F/u PCP for elevated TSH, normal free T4 - possibly sick euthyroid  Arrange transition of care visit within 1 week.  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 He is aware of the recommendation not to drive for 6 months. They got paperwork from his employer  yesterday and they will be talking with them further to find out what kind of documentation they'll need. Per his request, will hold off on a work note until we know exactly what forms need to be filled out  At his f/u visit we will likely go ahead and arrange the echocardiogram  Cardiac rehab - I have submitted a referral for him to be enrolled  Our scheduling line was continually busy so [facility] requesting the above follow-up appointments and our office will call the patient with this information. Since the patient was seen by Dr. Excell Seltzerooper this admission he will serve as the patient's primary cardiologist.  The patient feels well today without further dyspnea. Dr. Excell Seltzerooper has seen and examined the patient today and feels he is stable for discharge.   Discharge Vitals: Blood pressure 103/59, pulse 75, temperature 97.5 F (36.4 C), temperature source Oral, resp. rate 18, height 6\' 3"  (1.905 m), weight 189 lb 6.4 oz (85.911 kg), SpO2 100 %.  Labs: Lab Results  Component Value Date   WBC 8.2 11/17/2014   HGB 12.8* 11/17/2014   HCT 37.7* 11/17/2014   MCV 90.2 11/17/2014   PLT 283 11/17/2014    Recent Labs Lab 11/17/14 0418  NA 138  K 3.9  CL 96  CO2 28  BUN 19  CREATININE 0.98  CALCIUM 9.2  GLUCOSE 100*    Recent Labs  11/16/14 1049 11/16/14 1435  CKTOTAL 218  --   CKMB 4.0  --   TROPONINI 2.26* 2.31*  2.44*    Diagnostic Studies/Procedures   Dg Chest 2 View  11/16/2014   CLINICAL DATA:  Chest pain and short of breath.  Left rib pain  EXAM: CHEST  2 VIEW  COMPARISON:  None.  FINDINGS: Cardiac enlargement. Diffusely increased lung markings which could be due to interstitial edema or chronic scarring. Small right effusion and Kerley B-lines are noted suggesting mild fluid overload. Left coronary stent noted. Negative for pneumonia. Apical scarring bilaterally.  IMPRESSION: Cardiac enlargement with prominent interstitial  markings and probable mild interstitial edema. There may be an element of chronic lung disease present as well.   Electronically Signed   By: Marlan Palauharles  Clark M.D.   On: 11/16/2014 10:13   Dg Ribs Unilateral Left  11/16/2014   CLINICAL DATA:  Rib pain  EXAM: LEFT RIBS - 2 VIEW  COMPARISON:  None.  FINDINGS: No fracture or other bone lesions are seen involving the ribs.  IMPRESSION: Negative.   Electronically Signed   By: Marlan Palauharles  Clark M.D.   On: 11/16/2014 10:01    Discharge Medications   Current Discharge Medication List    START taking these medications   Details  losartan (COZAAR) 25 MG tablet Take 1 tablet (25 mg total) by mouth daily. Qty: 30 tablet, Refills: 6    nicotine (NICODERM CQ - DOSED IN MG/24 HOURS) 14 mg/24hr patch Place 1 patch (14 mg total) onto the skin daily. Qty:  28 patch, Refills: 0    nitroGLYCERIN (NITROSTAT) 0.4 MG SL tablet Place 1 tablet (0.4 mg total) under the tongue every 5 (five) minutes as needed for chest pain (up to 3 doses). Qty: 25 tablet, Refills: 3    prasugrel (EFFIENT) 10 MG TABS tablet Take 1 tablet (10 mg total) by mouth daily. Qty: 30 tablet, Refills: 11    zolpidem (AMBIEN) 10 MG tablet Take 1 tablet (10 mg total) by mouth at bedtime as needed for sleep. Qty: 12 tablet, Refills: 0      CONTINUE these medications which have NOT CHANGED   Details  aspirin EC 81 MG tablet Take 81 mg by mouth daily.    atorvastatin (LIPITOR) 20 MG tablet Take 20 mg by mouth at bedtime.    carvedilol (COREG) 25 MG tablet Take 37.5 mg by mouth 2 (two) times daily with a meal.    furosemide (LASIX) 40 MG tablet Take 40 mg by mouth daily.    potassium chloride SA (K-DUR,KLOR-CON) 20 MEQ tablet Take 40 mEq by mouth daily.      STOP taking these medications     lisinopril (PRINIVIL,ZESTRIL) 2.5 MG tablet      ticagrelor (BRILINTA) 90 MG TABS tablet         Disposition   The patient will be discharged in stable condition to home. Discharge  Instructions    Diet - low sodium heart healthy    Complete by:  As directed      Increase activity slowly    Complete by:  As directed   No driving for 6 months. No lifting over 10 lbs for 4 weeks. No sexual activity for 4 weeks. You may not return to work until cleared by your cardiologist. Keep your cath procedure site clean & dry. If you notice increased pain, swelling, bleeding or pus, call/return!   For patients with congestive heart failure, we give them these special instructions:  1. Follow a low-salt diet and watch your fluid intake. In general, you should not be taking in more than 2 liters of fluid per day (no more than 8 glasses per day). Some patients are restricted to less than 1.5 liters of fluid per day (no more than 6 glasses per day). This includes sources of water in foods like soup, coffee, tea, milk, etc. 2. Weigh yourself on the same scale at same time of day and keep a log. 3. Call your doctor: (Anytime you feel any of the following symptoms)  - 3-4 pound weight gain in 1-2 days or 2 pounds overnight  - Shortness of breath, with or without a dry hacking cough  - Swelling in the hands, feet or stomach  - If you have to sleep on extra pillows at night in order to breathe  IT IS IMPORTANT TO LET YOUR DOCTOR KNOW EARLY ON IF YOU ARE HAVING SYMPTOMS SO WE CAN HELP YOU!  Your lisinopril was changed to losartan.  Your Brilinta was changed to Effient. We have also prescribed new prescriptions of nitroglycerin, ambien and nicotine patch.          Follow-up Information    Follow up with Tonny BollmanMichael Cooper, MD.   Specialty:  Cardiology   Why:  Office will call you to change your follow-up appointment. Call office if you have not heard back by Monday afternoon.   Contact information:   [address] [place] [address] [phone]       Follow up with Hillis RangeAllred, James,  MD.   Specialty:  Cardiology   Why:  Office will call you for your followup appointment  to be seen in 4 weeks.   Contact information:   [address] [place] [address] [phone]       Follow up with Primary Care Provider.   Why:  Your TSH was slightly elevated elevated but free T4 was normal. This may represent sick euthyroid syndrome but you need to have it repeated to make sure it comes back to normal. This should be rechecked by your primary care doctor in a few weeks.        Duration of Discharge Encounter: Greater than 30 minutes including physician and PA time.  Signed, Pascha Fogal PA-C 11/17/2014, 1:24 PM

## 2014-11-17 NOTE — Progress Notes (Signed)
Cardiac Rehab 1330-1430 Pt states that he had walked in hall denies any cp or SOB. Comlpeted MI, stent and CHF education with pt and wife. They voice understanding. He states that he quit smoking the day of his MI and he plans to quit using patch. I gave him tips for quitting, coaching contact number and quit smart class information. He is very interested in attending Outpt. [facility] in [place], will send referral. Pt seems very committed to making life style changes. We discussed CHF, zones, sodium and fluid restrictions, when to call MD and 911. Beatrix FettersHughes, Kyle Bullock G, RN 11/17/2014 2:29 PM

## 2014-11-17 NOTE — Care Management Note (Signed)
    Page 1 of 1   11/17/2014     3:41:32 PM CARE MANAGEMENT NOTE 11/17/2014  Patient:  Kyle Bullock,Kyle Bullock   Account Number:  [id number]  Date Initiated:  11/17/2014  Documentation initiated by:  Letha CapeAYLOR,Kay Shippy  Subjective/Objective Assessment:   dx NSVT, dyspnea, ISCM  admit- from with spouse.     Action/Plan:   Anticipated DC Date:  11/10/2014   Anticipated DC Plan:  HOME/SELF CARE      DC Planning Services  CM consult      Choice offered to / List presented to:             Status of service:  Completed, signed off Medicare Important Message given?  NO (If response is "NO", the following Medicare IM given date fields will be blank) Date Medicare IM given:   Medicare IM given by:   Date Additional Medicare IM given:   Additional Medicare IM given by:    Discharge Disposition:  HOME/SELF CARE  Per UR Regulation:    If discussed at Long Length of Stay Meetings, dates discussed:    Comments:  11/17/14 1428 Letha Capeeborah Marque Rademaker RN, BSN [phone] NCM received referral for effient medication, NCM awaitng benefit check.  No prior auth needed , NCM gave patient effient savings card.  NCM checked with [facility] on [address] , they state they did not have medication, NCM called [facility] on [address] they have effient , patient will go there to get med filled.  Infrome Floor RN of this information.

## 2014-11-17 NOTE — Progress Notes (Addendum)
Patient: Kyle Bullock / Admit Date: 11/16/2014 / Date of Encounter: 11/17/2014, 7:19 AM   Subjective: Feeling great. No further dyspnea. No CP.   Objective: Telemetry: NSR, occasional PVCs, episode of ventricular trigeminy last night, one PVC couplet but no further NSVT Physical Exam: Blood pressure 103/66, pulse 76, temperature 98.4 F (36.9 C), temperature source Oral, resp. rate 18, height 6\' 3"  (1.905 m), weight 189 lb 6.4 oz (85.911 kg), SpO2 97 %. General: Well developed, well nourished WM in no acute distress. Head: Normocephalic, atraumatic, sclera non-icteric, no xanthomas, nares are without discharge. Neck: JVP not elevated. Lungs: Clear bilaterally to auscultation without wheezes, rales, or rhonchi. Breathing is unlabored. Heart: RRR S1 S2 without murmurs, rubs, or gallops.  Abdomen: Soft, non-tender, non-distended with normoactive bowel sounds. No rebound/guarding. Extremities: No clubbing or cyanosis. No edema. Distal pedal pulses are 2+ and equal bilaterally. Neuro: Alert and oriented X 3. Moves all extremities spontaneously. Psych:  Responds to questions appropriately with a normal affect.  No intake or output data in the 24 hours ending 11/17/14 0719  Inpatient Medications:  . aspirin EC  81 mg Oral Daily  . atorvastatin  20 mg Oral QHS  . carvedilol  37.5 mg Oral BID WC  . Chlorhexidine Gluconate Cloth  6 each Topical Q0600  . furosemide  40 mg Oral Daily  . heparin  5,000 Units Subcutaneous 3 times per day  . lisinopril  2.5 mg Oral Daily  . nicotine  14 mg Transdermal Daily  . potassium chloride SA  40 mEq Oral Daily  . prasugrel  10 mg Oral Daily   Infusions:    Labs:  Recent Labs  11/16/14 0950 11/16/14 1435 11/17/14 0418  NA 135*  --  138  K 4.2  --  3.9  CL 96  --  96  CO2 23  --  28  GLUCOSE 122*  --  100*  BUN 20  --  19  CREATININE 0.84 0.92 0.98  CALCIUM 9.3  --  9.2  MG  --  2.2  --    No results for input(s): AST, ALT, ALKPHOS,  BILITOT, PROT, ALBUMIN in the last 72 hours.  Recent Labs  11/16/14 1435 11/17/14 0418  WBC 7.9 8.2  HGB 12.9* 12.8*  HCT 37.0* 37.7*  MCV 89.8 90.2  PLT 253 283    Recent Labs  11/16/14 1049 11/16/14 1435  CKTOTAL 218  --   CKMB 4.0  --   TROPONINI 2.26* 2.31*  2.44*   Invalid input(s): POCBNP No results for input(s): HGBA1C in the last 72 hours.   Radiology/Studies:  Dg Chest 2 View  11/16/2014   CLINICAL DATA:  Chest pain and short of breath.  Left rib pain  EXAM: CHEST  2 VIEW  COMPARISON:  None.  FINDINGS: Cardiac enlargement. Diffusely increased lung markings which could be due to interstitial edema or chronic scarring. Small right effusion and Kerley B-lines are noted suggesting mild fluid overload. Left coronary stent noted. Negative for pneumonia. Apical scarring bilaterally.  IMPRESSION: Cardiac enlargement with prominent interstitial markings and probable mild interstitial edema. There may be an element of chronic lung disease present as well.   Electronically Signed   By: Marlan Palauharles  Clark M.D.   On: 11/16/2014 10:13   Dg Ribs Unilateral Left  11/16/2014   CLINICAL DATA:  Rib pain  EXAM: LEFT RIBS - 2 VIEW  COMPARISON:  None.  FINDINGS: No fracture or other bone lesions are seen involving the ribs.  IMPRESSION: Negative.   Electronically Signed   By: Marlan Palauharles  Clark M.D.   On: 11/16/2014 10:01     Assessment and Plan  1. NSVT 2. CAD s/p recent VF arrest/STEMI s/p Dx 1 POBA and DES to LAD  3. Dyspnea - possibly due to Brilinta, changed to Effient 4. Ischemic cardiomyopathy EF 20% 5. HTN 6. Tobacco abuse 7. RBBB 7. Dyslipidemia 8. Abnormal TSH  Patient had 18 beats of NSVT in the ED. Lifevest was reviewed by Dr. Johney FrameAllred and no outpatient arrhythmias >150. Dr. Johney FrameAllred recommended no driving x 6 months and reinforced compliance with Lifevest. He considered EP study and possible ICD if inducible arrhythmias found but he thought a more conservative approach would be to  continue medical therapy with lifevest in place and then reassess EF after 90 days post revascularization.The patient would prefer this approach also. Keep Mg>2 and K>4 - KCl already increased to 40meq daily.   Will discuss troponin trend with MD. POC 7.35 then 2.26-2.44-2.31 - ? reflective of prior STEMI. No recurrent CP since event.  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 He is aware of the recommendation not to drive for 6 months. They got paperwork from his employer yesterday and will be talking with them further to find out what kind of documentation they'll need. We'll hold off on a work note per his request for know until we know exactly what forms need to be filled out - The patient's appointment will need to be rescheduled from Dr. Mayford Knifeurner to Dr. Excell Seltzerooper   Signed, Ronie Spiesayna Dunn PA-C  Patient seen, examined. Available data reviewed. Agree with findings, assessment, and plan as outlined by Ronie Spiesayna Dunn, PA-C. The patient was independently interviewed and examined. Exam is pertinent for clear lung fields with good air movement, normal JVP, heart is regular rate and rhythm without murmur or gallop, there is no peripheral edema.  Electrophysiology consultation reviewed and appreciate Dr. Jenel LucksAllred's assessment. Plans to continue aggressive medical therapy and lifevest. Will follow-up with an echocardiogram in 3 months. The patient appears stable for discharge. He continues to have mild shortness of breath, but had a good diuresis yesterday. I think it will take a few days of Brilinta wash out for his breathing to normalize. Also suspect that interstitial edema/congestive heart failure is  playing a role. However, we need to be careful not to over diurese him. Would continue with oral furosemide at this point. We'll plan to ambulate him this morning and potentially discharge home this afternoon. Issues at discharge as follows:  Will change lisinopril to low-dose losartan because of a continued nonproductive cough  Prescribe nicotine patch 14 mg daily  Prescribe Ambien 10 mg as needed for sleep, #12, no refills  Brilinta has been changed to Effient because of shortness of breath  Reviewed importance of daily weights and sliding scale diuretics. The patient or his wife will call the office if he has a 3 pound weight gain within 24 hours or 5 pound weight gain from baseline  Arrange transition of care visit within 1 week.  Will follow-up on free T4 level, but suspect euthyroid sick  Troponin trend reviewed and  I think this is residual elevation from his recent infarct with an essentially flat trend  Tonny Bollman, M.D. 11/17/2014 9:22 AM

## 2014-11-17 NOTE — Telephone Encounter (Signed)
New message     TCM appt on 11-24-14 at 8:30 will Kyle Bullock[name]

## 2014-11-17 NOTE — Progress Notes (Signed)
UR Completed.  [phone]  

## 2014-11-19 ENCOUNTER — Encounter (HOSPITAL_COMMUNITY)
Admission: EM | Disposition: A | Discharge status: Home / Self Care | Payer: Self-pay | Source: Home / Self Care | Attending: Cardiovascular Disease

## 2014-11-19 ENCOUNTER — Observation Stay (HOSPITAL_COMMUNITY)
Admission: EM | Admit: 2014-11-19 | Discharge: 2014-11-22 | Disposition: A | Discharge status: Home / Self Care | Payer: BC Managed Care – PPO | Attending: Cardiovascular Disease | Admitting: Cardiovascular Disease

## 2014-11-19 ENCOUNTER — Encounter (HOSPITAL_COMMUNITY): Payer: Self-pay | Admitting: Emergency Medicine

## 2014-11-19 ENCOUNTER — Inpatient Hospital Stay (HOSPITAL_COMMUNITY): Payer: BC Managed Care – PPO

## 2014-11-19 DIAGNOSIS — I249 Acute ischemic heart disease, unspecified: Secondary | ICD-10-CM

## 2014-11-19 DIAGNOSIS — E785 Hyperlipidemia, unspecified: Secondary | ICD-10-CM | POA: Diagnosis not present

## 2014-11-19 DIAGNOSIS — I2102 ST elevation (STEMI) myocardial infarction involving left anterior descending coronary artery: Secondary | ICD-10-CM | POA: Diagnosis not present

## 2014-11-19 DIAGNOSIS — R072 Precordial pain: Principal | ICD-10-CM | POA: Insufficient documentation

## 2014-11-19 DIAGNOSIS — Z955 Presence of coronary angioplasty implant and graft: Secondary | ICD-10-CM | POA: Insufficient documentation

## 2014-11-19 DIAGNOSIS — I5041 Acute combined systolic (congestive) and diastolic (congestive) heart failure: Secondary | ICD-10-CM | POA: Insufficient documentation

## 2014-11-19 DIAGNOSIS — I4901 Ventricular fibrillation: Secondary | ICD-10-CM | POA: Insufficient documentation

## 2014-11-19 DIAGNOSIS — I2510 Atherosclerotic heart disease of native coronary artery without angina pectoris: Secondary | ICD-10-CM | POA: Insufficient documentation

## 2014-11-19 DIAGNOSIS — I255 Ischemic cardiomyopathy: Secondary | ICD-10-CM | POA: Diagnosis not present

## 2014-11-19 DIAGNOSIS — I10 Essential (primary) hypertension: Secondary | ICD-10-CM | POA: Diagnosis not present

## 2014-11-19 DIAGNOSIS — I309 Acute pericarditis, unspecified: Secondary | ICD-10-CM | POA: Insufficient documentation

## 2014-11-19 DIAGNOSIS — I2109 ST elevation (STEMI) myocardial infarction involving other coronary artery of anterior wall: Secondary | ICD-10-CM

## 2014-11-19 DIAGNOSIS — I5043 Acute on chronic combined systolic (congestive) and diastolic (congestive) heart failure: Secondary | ICD-10-CM

## 2014-11-19 DIAGNOSIS — Z87891 Personal history of nicotine dependence: Secondary | ICD-10-CM | POA: Insufficient documentation

## 2014-11-19 DIAGNOSIS — R079 Chest pain, unspecified: Secondary | ICD-10-CM | POA: Diagnosis present

## 2014-11-19 DIAGNOSIS — I319 Disease of pericardium, unspecified: Secondary | ICD-10-CM | POA: Insufficient documentation

## 2014-11-19 HISTORY — PX: CARDIAC CATHETERIZATION: SHX172

## 2014-11-19 HISTORY — PX: LEFT HEART CATH: SHX5478

## 2014-11-19 LAB — COMPREHENSIVE METABOLIC PANEL
ALT: 64 U/L — AB (ref 0–53)
AST: 24 U/L (ref 0–37)
Albumin: 2.9 g/dL — ABNORMAL LOW (ref 3.5–5.2)
Alkaline Phosphatase: 109 U/L (ref 39–117)
Anion gap: 15 (ref 5–15)
BILIRUBIN TOTAL: 0.3 mg/dL (ref 0.3–1.2)
BUN: 21 mg/dL (ref 6–23)
CALCIUM: 9.1 mg/dL (ref 8.4–10.5)
CHLORIDE: 95 meq/L — AB (ref 96–112)
CO2: 22 meq/L (ref 19–32)
Creatinine, Ser: 1.01 mg/dL (ref 0.50–1.35)
GFR calc Af Amer: 89 mL/min — ABNORMAL LOW (ref >90)
GFR, EST NON AFRICAN AMERICAN: 77 mL/min — AB (ref >90)
GLUCOSE: 99 mg/dL (ref 70–99)
Potassium: 4.2 mEq/L (ref 3.7–5.3)
SODIUM: 132 meq/L — AB (ref 137–147)
Total Protein: 6.4 g/dL (ref 6.0–8.3)

## 2014-11-19 LAB — BASIC METABOLIC PANEL
ANION GAP: 15 (ref 5–15)
BUN: 21 mg/dL (ref 6–23)
CALCIUM: 9.2 mg/dL (ref 8.4–10.5)
CO2: 23 mEq/L (ref 19–32)
CREATININE: 1.04 mg/dL (ref 0.50–1.35)
Chloride: 97 mEq/L (ref 96–112)
GFR calc Af Amer: 86 mL/min — ABNORMAL LOW (ref >90)
GFR calc non Af Amer: 74 mL/min — ABNORMAL LOW (ref >90)
Glucose, Bld: 152 mg/dL — ABNORMAL HIGH (ref 70–99)
Potassium: 4.5 mEq/L (ref 3.7–5.3)
Sodium: 135 mEq/L — ABNORMAL LOW (ref 137–147)

## 2014-11-19 LAB — TROPONIN I
TROPONIN I: 0.56 ng/mL — AB (ref <0.30)
Troponin I: 0.77 ng/mL (ref <0.30)
Troponin I: 0.49 ng/mL (ref <0.30)

## 2014-11-19 LAB — CBC
HCT: 36.7 % — ABNORMAL LOW (ref 39.0–52.0)
HCT: 36.8 % — ABNORMAL LOW (ref 39.0–52.0)
HEMOGLOBIN: 12.5 g/dL — AB (ref 13.0–17.0)
Hemoglobin: 12.6 g/dL — ABNORMAL LOW (ref 13.0–17.0)
MCH: 30.8 pg (ref 26.0–34.0)
MCH: 31 pg (ref 26.0–34.0)
MCHC: 34.1 g/dL (ref 30.0–36.0)
MCHC: 34.2 g/dL (ref 30.0–36.0)
MCV: 90.4 fL (ref 78.0–100.0)
MCV: 90.4 fL (ref 78.0–100.0)
PLATELETS: 329 10*3/uL (ref 150–400)
Platelets: 315 10*3/uL (ref 150–400)
RBC: 4.06 MIL/uL — AB (ref 4.22–5.81)
RBC: 4.07 MIL/uL — ABNORMAL LOW (ref 4.22–5.81)
RDW: 12.8 % (ref 11.5–15.5)
RDW: 12.9 % (ref 11.5–15.5)
WBC: 9.9 10*3/uL (ref 4.0–10.5)
WBC: 10.8 10*3/uL — ABNORMAL HIGH (ref 4.0–10.5)

## 2014-11-19 LAB — I-STAT TROPONIN, ED: Troponin i, poc: 2.06 ng/mL (ref 0.00–0.08)

## 2014-11-19 LAB — PRO B NATRIURETIC PEPTIDE: Pro B Natriuretic peptide (BNP): 4569 pg/mL — ABNORMAL HIGH (ref 0–125)

## 2014-11-19 LAB — HEPARIN LEVEL (UNFRACTIONATED)

## 2014-11-19 SURGERY — LEFT HEART CATH
Anesthesia: Moderate Sedation

## 2014-11-19 MED ORDER — CARVEDILOL 25 MG PO TABS
37.5000 mg | ORAL_TABLET | Freq: Two times a day (BID) | ORAL | Status: DC
  Filled 2014-11-19 (×3): qty 1

## 2014-11-19 MED ORDER — SODIUM CHLORIDE 0.9 % IV SOLN
250.0000 mL | INTRAVENOUS | Status: DC | PRN
Start: 2014-11-19 — End: 2014-11-22

## 2014-11-19 MED ORDER — ATORVASTATIN CALCIUM 20 MG PO TABS
20.0000 mg | ORAL_TABLET | Freq: Every day | ORAL | Status: DC
  Administered 2014-11-19 – 2014-11-21 (×3): 20 mg via ORAL
  Filled 2014-11-19 (×4): qty 1

## 2014-11-19 MED ORDER — ACETAMINOPHEN 325 MG PO TABS: 650.0000 mg | ORAL_TABLET | ORAL | Status: DC | PRN

## 2014-11-19 MED ORDER — ASPIRIN EC 81 MG PO TBEC
81.0000 mg | DELAYED_RELEASE_TABLET | Freq: Every day | ORAL | Status: DC
Start: 2014-11-19 — End: 2014-11-22
  Administered 2014-11-19 – 2014-11-22 (×4): 81 mg via ORAL
  Filled 2014-11-19 (×4): qty 1

## 2014-11-19 MED ORDER — MORPHINE SULFATE 2 MG/ML IJ SOLN
INTRAMUSCULAR | Status: AC
  Administered 2014-11-19: 2 mg via INTRAVENOUS
  Filled 2014-11-19: qty 1

## 2014-11-19 MED ORDER — MORPHINE SULFATE 2 MG/ML IJ SOLN
1.0000 mg | Freq: Once | INTRAMUSCULAR | Status: AC
  Administered 2014-11-19: 1 mg via INTRAVENOUS
  Filled 2014-11-19: qty 1

## 2014-11-19 MED ORDER — VERAPAMIL HCL 2.5 MG/ML IV SOLN
INTRAVENOUS | Status: AC
  Filled 2014-11-19: qty 2

## 2014-11-19 MED ORDER — ONDANSETRON HCL 4 MG/2ML IJ SOLN: 4.0000 mg | Freq: Four times a day (QID) | INTRAMUSCULAR | Status: DC | PRN

## 2014-11-19 MED ORDER — MORPHINE SULFATE 2 MG/ML IJ SOLN
2.0000 mg | INTRAMUSCULAR | Status: DC | PRN
  Administered 2014-11-19 – 2014-11-20 (×5): 2 mg via INTRAVENOUS
  Filled 2014-11-19 (×5): qty 1

## 2014-11-19 MED ORDER — CARVEDILOL 25 MG PO TABS
25.0000 mg | ORAL_TABLET | Freq: Two times a day (BID) | ORAL | Status: DC
  Filled 2014-11-19 (×2): qty 1

## 2014-11-19 MED ORDER — HEPARIN BOLUS VIA INFUSION
4000.0000 [IU] | Freq: Once | INTRAVENOUS | Status: AC
  Administered 2014-11-19: 4000 [IU] via INTRAVENOUS
  Filled 2014-11-19: qty 4000

## 2014-11-19 MED ORDER — POTASSIUM CHLORIDE CRYS ER 20 MEQ PO TBCR
40.0000 meq | EXTENDED_RELEASE_TABLET | Freq: Every day | ORAL | Status: DC
  Administered 2014-11-19 – 2014-11-22 (×4): 40 meq via ORAL
  Filled 2014-11-19 (×4): qty 2

## 2014-11-19 MED ORDER — SODIUM CHLORIDE 0.9 % IJ SOLN: 3.0000 mL | INTRAMUSCULAR | Status: DC | PRN

## 2014-11-19 MED ORDER — HEPARIN SODIUM (PORCINE) 5000 UNIT/ML IJ SOLN
INTRAMUSCULAR | Status: AC
  Administered 2014-11-19: 4000 [IU]
  Filled 2014-11-19: qty 1

## 2014-11-19 MED ORDER — PRASUGREL HCL 10 MG PO TABS
10.0000 mg | ORAL_TABLET | Freq: Every day | ORAL | Status: DC
  Administered 2014-11-19 – 2014-11-22 (×4): 10 mg via ORAL
  Filled 2014-11-19 (×5): qty 1

## 2014-11-19 MED ORDER — HEPARIN (PORCINE) IN NACL 100-0.45 UNIT/ML-% IJ SOLN
1300.0000 [IU]/h | INTRAMUSCULAR | Status: DC
  Administered 2014-11-19: 1300 [IU]/h via INTRAVENOUS
  Filled 2014-11-19 (×2): qty 250

## 2014-11-19 MED ORDER — LIDOCAINE HCL (PF) 1 % IJ SOLN
INTRAMUSCULAR | Status: AC
  Filled 2014-11-19: qty 30

## 2014-11-19 MED ORDER — NICOTINE 14 MG/24HR TD PT24
14.0000 mg | MEDICATED_PATCH | Freq: Every day | TRANSDERMAL | Status: DC
Start: 2014-11-19 — End: 2014-11-21
  Administered 2014-11-19 – 2014-11-20 (×2): 14 mg via TRANSDERMAL
  Filled 2014-11-19 (×3): qty 1

## 2014-11-19 MED ORDER — PRASUGREL HCL 10 MG PO TABS: 10.0000 mg | ORAL_TABLET | Freq: Every day | ORAL | Status: DC

## 2014-11-19 MED ORDER — NITROGLYCERIN 1 MG/10 ML FOR IR/CATH LAB
INTRA_ARTERIAL | Status: AC
  Filled 2014-11-19: qty 10

## 2014-11-19 MED ORDER — ASPIRIN 81 MG PO CHEW: 81.0000 mg | CHEWABLE_TABLET | Freq: Every day | ORAL | Status: DC

## 2014-11-19 MED ORDER — NITROGLYCERIN 0.4 MG SL SUBL: 0.4000 mg | SUBLINGUAL_TABLET | SUBLINGUAL | Status: DC | PRN

## 2014-11-19 MED ORDER — CARVEDILOL 12.5 MG PO TABS
12.5000 mg | ORAL_TABLET | Freq: Two times a day (BID) | ORAL | Status: DC
  Administered 2014-11-19 – 2014-11-20 (×2): 12.5 mg via ORAL
  Filled 2014-11-19 (×3): qty 1

## 2014-11-19 MED ORDER — NICOTINE 14 MG/24HR TD PT24: 14.0000 mg | MEDICATED_PATCH | Freq: Every day | TRANSDERMAL | Status: DC

## 2014-11-19 MED ORDER — ZOLPIDEM TARTRATE 5 MG PO TABS
10.0000 mg | ORAL_TABLET | Freq: Every evening | ORAL | Status: DC | PRN
  Administered 2014-11-21: 10 mg via ORAL
  Filled 2014-11-19: qty 2

## 2014-11-19 MED ORDER — FUROSEMIDE 10 MG/ML IJ SOLN: 20.0000 mg | Freq: Once | INTRAMUSCULAR | Status: DC

## 2014-11-19 MED ORDER — HEPARIN (PORCINE) IN NACL 2-0.9 UNIT/ML-% IJ SOLN
INTRAMUSCULAR | Status: AC
  Filled 2014-11-19: qty 1500

## 2014-11-19 MED ORDER — FUROSEMIDE 40 MG PO TABS: 40.0000 mg | ORAL_TABLET | Freq: Every day | ORAL | Status: DC

## 2014-11-19 MED ORDER — SODIUM CHLORIDE 0.9 % IJ SOLN
3.0000 mL | Freq: Two times a day (BID) | INTRAMUSCULAR | Status: DC
  Administered 2014-11-19 – 2014-11-21 (×6): 3 mL via INTRAVENOUS

## 2014-11-19 MED ORDER — FUROSEMIDE 10 MG/ML IJ SOLN
20.0000 mg | Freq: Once | INTRAMUSCULAR | Status: AC
Start: 2014-11-19 — End: 2014-11-19
  Administered 2014-11-19: 20 mg via INTRAVENOUS
  Filled 2014-11-19: qty 2

## 2014-11-19 MED ORDER — SODIUM CHLORIDE 0.9 % IV SOLN
INTRAVENOUS | Status: DC
  Administered 2014-11-19: 07:00:00 via INTRAVENOUS

## 2014-11-19 MED ORDER — LOSARTAN POTASSIUM 25 MG PO TABS
25.0000 mg | ORAL_TABLET | Freq: Every day | ORAL | Status: DC
  Administered 2014-11-20: 25 mg via ORAL
  Filled 2014-11-19 (×3): qty 1

## 2014-11-19 MED ORDER — ACETAMINOPHEN 325 MG PO TABS
650.0000 mg | ORAL_TABLET | ORAL | Status: DC | PRN
  Administered 2014-11-19: 650 mg via ORAL
  Filled 2014-11-19: qty 2

## 2014-11-19 NOTE — Progress Notes (Signed)
ANTICOAGULATION CONSULT NOTE - Initial Consult  Pharmacy Consult for Heparin  Indication: chest pain/ACS  Allergies  Allergen Reactions  . Brilinta [Ticagrelor]     ? Dyspnea. Changed to Effient 11/2014.    Patient Measurements: 85.9 kg  Vital Signs: Temp: 98.1 F (36.7 C) (12/13 0217) Temp Source: Oral (12/13 0217) BP: 84/58 mmHg (12/13 0421) Pulse Rate: 71 (12/13 0421)  Labs:  Recent Labs  11/16/14 0950 11/16/14 1049 11/16/14 1435 11/17/14 0418  HGB 13.0  --  12.9* 12.8*  HCT 37.8*  --  37.0* 37.7*  PLT 211  --  253 283  CREATININE 0.84  --  0.92 0.98  CKTOTAL  --  218  --   --   CKMB  --  4.0  --   --   TROPONINI  --  2.26* 2.31*  2.44*  --     Estimated Creatinine Clearance: 91 mL/min (by C-G formula based on Cr of 0.98).   Medical History: Past Medical History  Diagnosis Date  . Hypertension   . Ischemic cardiomyopathy     a. 11/2014: EF reportedly 20% by cath [place]  . VF (ventricular fibrillation) 11/10/14    a. 11/2014: arrest with STEMI.  Marland Kitchen. Myocardial infarction 11/10/2014  . NSVT (nonsustained ventricular tachycardia) 11/16/14    a. 11/2014: admitted after discharge from STEMI admission, 18 beats 135-140bpm in ED.  Marland Kitchen. CAD (coronary artery disease)     a. 11/2014: arrest/STEMI s/p Dx 1 POBA and DES to LAD [place]  Marland Kitchen. Dyspnea     a. 11/2014: possibly due to combo of CHF and Brilinta. Brilinta changed to Effient.  . Tobacco abuse   . RBBB   . Dyslipidemia   . Abnormal TSH     Assessment: 63 y/o M recent discharged back with chest pain to start heparin per pharmacy. Labs ordered, Hgb 12.8 from 12/10, Scr 0.98 from 12/11.   Goal of Therapy:  Heparin level 0.3-0.7 units/ml Monitor platelets by anticoagulation protocol: Yes   Plan:  -Heparin 4000 units BOLUS -Start heparin drip at 1300 units/hr -1300 HL -Daily CBC/HL  -Monitor for bleeding -F/U CBC/BMET  Abran DukeLedford, Kenzie Flakes 11/19/2014,4:33 AM

## 2014-11-19 NOTE — ED Notes (Signed)
Dr Hochrein at bedside 

## 2014-11-19 NOTE — ED Notes (Signed)
Paged Hochrein to [place], RCharity fundraiser

## 2014-11-19 NOTE — Progress Notes (Signed)
ANTICOAGULATION CONSULT NOTE - Follow Up Consult  Pharmacy Consult for Heparin  Indication: chest pain/ACS  Allergies  Allergen Reactions  . Brilinta [Ticagrelor]     ? Dyspnea. Changed to Effient 11/2014.    Patient Measurements: 85.9 kg  Vital Signs: Temp: 98.1 F (36.7 C) (12/13 1215) Temp Source: Oral (12/13 1215) BP: 104/71 mmHg (12/13 1330) Pulse Rate: 75 (12/13 1343)  Labs:  Recent Labs  11/17/14 0418 11/19/14 0215 11/19/14 1100 11/19/14 1320  HGB 12.8* 12.5* 12.6*  --   HCT 37.7* 36.7* 36.8*  --   PLT 283 329 315  --   HEPARINUNFRC  --   --   --  <0.10*  CREATININE 0.98 1.01 1.04  --   TROPONINI  --  0.77* 0.56*  --     Estimated Creatinine Clearance: 85.8 mL/min (by C-G formula based on Cr of 1.04).   Medical History: Past Medical History  Diagnosis Date  . Hypertension   . Ischemic cardiomyopathy     a. 11/2014: EF reportedly 20% by cath [place].  . VF (ventricular fibrillation) 11/10/14    a. 11/2014: arrest with STEMI.  Marland Kitchen. Myocardial infarction 11/10/2014  . NSVT (nonsustained ventricular tachycardia) 11/16/14    a. 11/2014: admitted after discharge from STEMI admission, 18 beats 135-140bpm in ED.  Marland Kitchen. CAD (coronary artery disease)     a. 11/2014: arrest/STEMI s/p Dx 1 POBA and DES to LAD [place]  Marland Kitchen. Dyspnea     a. 11/2014: possibly due to combo of CHF and Brilinta. Brilinta changed to Effient.  . Tobacco abuse   . RBBB   . Dyslipidemia   . Abnormal TSH     Assessment: 63 y/o M with recent VF arrest and MI DES to LAD - readmitted for CP recathed patent stents - continues to have some CP- Tp trending down and SOB at rest but worse lying flat.  Heparin drip 1300 uts/hr HL < 0.1, CBC stable, renal fx stable.  Spoke to MD - stop heparin drip  Goal of Therapy:  Heparin level 0.3-0.7 units/ml Monitor platelets by anticoagulation protocol: Yes   Plan:  D/c heparin  Leota SauersLisa Fletcher Rathbun Pharm.D. CPP, BCPS Clinical  Pharmacist [phone] 11/19/2014 3:11 PM

## 2014-11-19 NOTE — Interval H&P Note (Signed)
Cath Lab Visit (complete for each Cath Lab visit)  Clinical Evaluation Leading to the Procedure:   ACS: Yes.    Non-ACS:    Anginal Classification: CCS IV  Anti-ischemic medical therapy: Maximal Therapy (2 or more classes of medications)  Non-Invasive Test Results: No non-invasive testing performed  Prior CABG: No previous CABG      History and Physical Interval Note:  11/19/2014 5:41 AM  Kyle Bullock  has presented today for surgery, with the diagnosis of STEMI  The various methods of treatment have been discussed with the patient and family. After consideration of risks, benefits and other options for treatment, the patient has consented to  Procedure(s): LEFT HEART CATH (N/A) as a surgical intervention .  The patient's history has been reviewed, patient examined, no change in status, stable for surgery.  I have reviewed the patient's chart and labs.  Questions were answered to the patient's satisfaction.     Runell GessBERRY,Verdun J

## 2014-11-19 NOTE — ED Notes (Signed)
Per ems-- pt had MI 1 week ago in which he had cardiac arrest- shocked x1. Stent placement. Today pt reprots mid chest pain. Denies nv. Pt took 324 asa and 2 nitro pta. Pt wears life vest at home. Pt appears pale. Alert &ox4. Skin warm and dry.

## 2014-11-19 NOTE — Progress Notes (Signed)
Chaplain responded to code stemi. Upon arrival to ED code stemi cancelled. Page chaplain as needed.  Marycarmen Hagey, Mayer MaskerCourtney F, Chaplain 11/19/2014 2:47 AM

## 2014-11-19 NOTE — CV Procedure (Signed)
[name] is a 63 y.o. male    [id number] LOCATION:  FACILITY: [facility]  PHYSICIAN: Nanetta BattyJonathan Azarel Banner, M.D. [birth year]   DATE OF PROCEDURE:  11/19/2014  DATE OF DISCHARGE:     CARDIAC CATHETERIZATION     History obtained from chart review.Mr. Kyle Bullock is a 63 year old married Caucasian male presenting to the Cath Lab for urgent angiography because of question of STEMI. He suffered an anterior wall myocardial infarction on 11/10/14 and was treated with stent[facility] LAD [facility]. He was discharged home on 11/15/14 and [facility] the [facility]  for [facility]. He had witnessed nonsustained ventricular tachycardia and has a known ejection fraction in the 20% range. He was seen by EP who recommended medical therapy and life vest. He was sent home the following day and was admitted today with recurrent chest pain. His troponins continue to 0.77 and his EKG showed no significant changes.   PROCEDURE DESCRIPTION:   The patient was brought to the second floor [facility] cath lab in the postabsorptive state. He was not premedicated . His right wristwas prepped and shaved in usual sterile fashion. Xylocaine 1% was used for local anesthesia. A 6 French sheath was inserted into the right radial artery using standard Seldinger technique. The patient received 4000  units  of heparin  Intravenously in the emergency room..  A 5 French TIG catheter was used to performed coronary angiography. Left ventriculography was not performed. Visipaque dye was used for the entirety of the case (45 mL and the superficial). Retrograde aortic pressure was monitored during the case.    HEMODYNAMICS:    AO SYSTOLIC/AO DIASTOLIC: 76/51    ANGIOGRAPHIC RESULTS:   1. Left main; normal  2. LAD; the LAD stent was widely patent as was the diagonal branch which arose just proximal to it. There were no significant lesions in the LAD territory 3. Left circumflex; the circumflex was  nondominant and was free of significant disease. There was a fairly long ramus branch that had minor irregularities in its proximal segment..  4. Right coronary artery; the right coronary artery was dominant and had 40-50% segmental stenosis in its midportion. 5. Left ventriculography;not performed  IMPRESSION:Mr. Kyle Bullock has a widely patent LAD stent. Otherwise his vessels appear patent without evidence of a culprit lesion. His troponins did continue to fall, measured 0.77 in the emergency room. His anterior wall does not appear to be moving on fluoroscopy when performing angiography of his coronary arteries. An alternative etiology of chest pain will need to be pursued such as pericarditis. The sheath was removed and a TR band was placed on the right wrist to achieve patent hemostasis. The patient left the cath lab in stable condition.  Runell GessBERRY,Jacen J. MD, [facility] 11/19/2014 6:10 AM

## 2014-11-19 NOTE — H&P (Signed)
CARDIOLOGY ADMISSION NOTE  Patient ID: Kyle Bullock MRN: [id number] DOB/AGE: 64-Jun-1952 64 y.o.  Admit date: 11/19/2014 Primary Physician   None Primary Cardiologist   Dr. Excell Seltzerooper Chief Complaint    Chest pain.    HPI:  The patient presents with chest pain less than 48 hours after discharge from the hospital with SOB.  He had an anterior [place] and cardiac [place].   [facility] was [facility] he had a VF arrest en route. He was shocked x 1 to NSR.  Urgent cath revealed total LAD occlusion at the takeoff of the first diagonal with 95% stenosis of D1. He underwent POBA of the D1 and LAD DES with a Xience stent. His EF at cath and by echo was 20%.  He was discharged 11/15/14 on ASA, Brilinta, Coreg, Lasix, Lipitor, Zerstril 2.5 mg. and a Life Vest. He was admitted on 12/10 with SOB.  He was thought to have mild CHF.  Also his Brilinta was stopped and he was started on Effient.  He did have mild troponin elevation felt to be residual from the previous MI.     He was restless today but not SOB or in pain.  However, after going to bed he noticed some pain not like his previous angina.  It was dull but with a sharp component.  It woke him at 12 AM.  He reports that this discomfort is in the left upper chest and shoulder.  His pain is 1 - 2/10 when he is not breathing and zero if he is sitting up and not breathing deeply.  It goes to 7/10 with deep breathing.  He has had no N/V, PND, orthopnea, cough, fevers or chills.  In the ED his EKG demonstrated some minimal ST change (less than 1/2 mm in the anterior precordial leads.  However, on careful review of two prior EKGs this is not significantly changed.  He has anterior Q waves.   Bedside echo demonstrated anterior wall akinesis as previously described.  However, there is no pericardial effusion.     Past Medical History  Diagnosis Date  . Hypertension   . Ischemic cardiomyopathy     a. 11/2014: EF  reportedly 20% by cath and [place].  . VF (ventricular fibrillation) 11/10/14    a. 11/2014: arrest with STEMI.  Marland Kitchen. Myocardial infarction 11/10/2014  . NSVT (nonsustained ventricular tachycardia) 11/16/14    a. 11/2014: admitted after discharge from STEMI admission, 18 beats 135-140bpm in ED.  Marland Kitchen. CAD (coronary artery disease)     a. 11/2014: arrest/STEMI s/p Dx 1 POBA and DES to LAD [place]  Marland Kitchen. Dyspnea     a. 11/2014: possibly due to combo of CHF and Brilinta. Brilinta changed to Effient.  . Tobacco abuse   . RBBB   . Dyslipidemia   . Abnormal TSH     Past Surgical History  Procedure Laterality Date  . Coronary stent placement  11/10/14    LAD DES  . Hand surgery Bilateral   . Knee arthroscopy  1982    Allergies  Allergen Reactions  . Brilinta [Ticagrelor]     ? Dyspnea. Changed to Effient 11/2014.   No current facility-administered medications on file prior to encounter.   Current Outpatient Prescriptions on File Prior to Encounter  Medication Sig Dispense Refill  . aspirin EC 81 MG tablet Take 81 mg by mouth daily.    Marland Kitchen. atorvastatin (LIPITOR) 20 MG tablet  Take 20 mg by mouth at bedtime.    . carvedilol (COREG) 25 MG tablet Take 37.5 mg by mouth 2 (two) times daily with a meal.    . furosemide (LASIX) 40 MG tablet Take 40 mg by mouth daily.    Marland Kitchen. losartan (COZAAR) 25 MG tablet Take 1 tablet (25 mg total) by mouth daily. 30 tablet 6  . nicotine (NICODERM CQ - DOSED IN MG/24 HOURS) 14 mg/24hr patch Place 1 patch (14 mg total) onto the skin daily. 28 patch 0  . nitroGLYCERIN (NITROSTAT) 0.4 MG SL tablet Place 1 tablet (0.4 mg total) under the tongue every 5 (five) minutes as needed for chest pain (up to 3 doses). 25 tablet 3  . potassium chloride SA (K-DUR,KLOR-CON) 20 MEQ tablet Take 40 mEq by mouth daily.    . prasugrel (EFFIENT) 10 MG TABS tablet Take 1 tablet (10 mg total) by mouth daily. 30 tablet 11  . zolpidem (AMBIEN) 10 MG tablet Take 1 tablet (10 mg total)  by mouth at bedtime as needed for sleep. 12 tablet 0   History   Social History  . Marital Status: Married    Spouse Name: N/A    Number of Children: 2  . Years of Education: N/A   Occupational History  . Not on file.   Social History Main Topics  . Smoking status: Former Smoker -- 2.00 packs/day for 40 years    Types: Cigarettes  . Smokeless tobacco: Never Used  . Alcohol Use: 0.0 oz/week    0 Not specified per week     Comment: daily    wine   . Drug Use: No  . Sexual Activity: Not on file   Other Topics Concern  . 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  [place] in [place].    Family History  Problem Relation Age of Onset  . Hypertension       ROS:  Diarrhea.  Otherwise as stated in the HPI and negative for all other systems.     Physical Exam: Blood pressure 98/61, pulse 75, temperature 98.1 F (36.7 C), temperature source Oral, resp. rate 24, SpO2 97 %.  GENERAL:  Well appearing HEENT:  Pupils equal round and reactive, fundi not visualized, oral mucosa unremarkable NECK:  No jugular venous distention, waveform within normal limits, carotid upstroke brisk and symmetric, no bruits, no thyromegaly LYMPHATICS:  No cervical, inguinal adenopathy LUNGS:  Clear to auscultation bilaterally BACK:  No CVA tenderness CHEST:  Unremarkable HEART:  PMI not displaced or sustained,S1 and S2 within normal limits, no S3, no S4, no clicks, no rubs, no murmurs ABD:  Flat, positive bowel sounds normal in frequency in pitch, no bruits, no rebound, no guarding, no midline pulsatile mass, no hepatomegaly, no splenomegaly EXT:  2 plus pulses throughout, no edema, no cyanosis no clubbing SKIN:  No rashes no nodules NEURO:  Cranial nerves II through XII grossly intact, motor grossly intact throughout PSYCH:  Cognitively intact, oriented to person place and time   Labs: Lab Results  Component Value Date   BUN 19  11/17/2014   Lab Results  Component Value Date   CREATININE 0.98 11/17/2014   Lab Results  Component Value Date   NA 138 11/17/2014   K 3.9 11/17/2014   CL 96 11/17/2014   CO2 28 11/17/2014   Lab Results  Component Value Date   TROPONINI 2.31* 11/16/2014  TROPONINI 2.44* 11/16/2014   Lab Results  Component Value Date   WBC 8.2 11/17/2014   HGB 12.8* 11/17/2014   HCT 37.7* 11/17/2014   MCV 90.2 11/17/2014   PLT 283 11/17/2014    Radiology:  CXR:  Pending  EKG:  NSR, rate 73, RBBB, ventricular ectopy, anterior MI.  Nonspecific ST changes.  11/19/2014  ASSESSMENT AND PLAN:    CHEST PAIN:  His pain is pleuritic and goes away completely when he is sitting up and not breathing.  There are no diagnostic EKG changes that would require urgent cardiac cath.  I suspect a pleuritic component to his pain.  There is no or perhaps trace pericardial fluid.  I will cycle enzymes and continue his current meds.  He will be started on heparin.  He would need to go to the cath lab if there is a clear trend upward in his cardiac enzymes.  I discussed this with the patient and his wife.  ICM:  Plan will be to continue the LifeVest at discharge. Continue current dose diuretic.    SignedRollene Rotunda 11/19/2014, 2:45 AM

## 2014-11-19 NOTE — ED Notes (Signed)
Pt c/o chest tightness unrelieved with nitroglycerin at home. Dr.Hochrein paged in regards to chest pain. Pt states "That drip medication works for me."

## 2014-11-19 NOTE — Progress Notes (Signed)
Patient Name: Kyle Bullock Date of Encounter: 11/19/2014  Active Problems:   Chest pain, precordial   Chest pain   Length of Stay: 0  SUBJECTIVE  Orthopneic. Sharp pain has resolved, but still very uncomfortable. Cath showed patent arteries. EDP not measured, LVgram not performed.  He has a clear pericardial rub on exam today.  CURRENT MEDS . aspirin EC  81 mg Oral Daily  . atorvastatin  20 mg Oral QHS  . carvedilol  37.5 mg Oral BID WC  . furosemide  40 mg Oral Daily  . losartan  25 mg Oral Daily  . nicotine  14 mg Transdermal Daily  . potassium chloride SA  40 mEq Oral Daily  . prasugrel  10 mg Oral Daily  . sodium chloride  3 mL Intravenous Q12H    OBJECTIVE  No intake or output data in the 24 hours ending 11/19/14 0827 Filed Weights   11/19/14 0634  Weight: 189 lb 9.5 oz (86 kg)    PHYSICAL EXAM Filed Vitals:   11/19/14 0530 11/19/14 0634 11/19/14 0700 11/19/14 0753  BP: 107/64 96/57 99/61    Pulse: 70 83 72   Temp:  98 F (36.7 C)  97.9 F (36.6 C)  TempSrc:  Oral  Oral  Resp:  24 23   Height:  6\' 3"  (1.905 m)    Weight:  189 lb 9.5 oz (86 kg)    SpO2: 97% 97% 91%    General: Alert, oriented x3, no distress Head: no evidence of trauma, PERRL, EOMI, no exophtalmos or lid lag, no myxedema, no xanthelasma; normal ears, nose and oropharynx Neck: normal jugular venous pulsations and no hepatojugular reflux; brisk carotid pulses without delay and no carotid bruits Chest: bibasilar moist rales, no signs of consolidation by percussion or palpation, normal fremitus, symmetrical and full respiratory excursions Cardiovascular: normal position and quality of the apical impulse, regular rhythm, normal first and second heart sounds, S3 gallop, no pericardial rub, no murmur Abdomen: no tenderness or distention, no masses by palpation, no abnormal pulsatility or arterial bruits, normal bowel sounds, no hepatosplenomegaly Extremities: no clubbing, cyanosis or edema;  2+ radial, ulnar and brachial pulses bilaterally; 2+ right femoral, posterior tibial and dorsalis pedis pulses; 2+ left femoral, posterior tibial and dorsalis pedis pulses; no subclavian or femoral bruits Neurological: grossly nonfocal  LABS  CBC  Recent Labs  11/17/14 0418 11/19/14 0215  WBC 8.2 9.9  HGB 12.8* 12.5*  HCT 37.7* 36.7*  MCV 90.2 90.4  PLT 283 329   Basic Metabolic Panel  Recent Labs  11/16/14 1435 11/17/14 0418 11/19/14 0215  NA  --  138 132*  K  --  3.9 4.2  CL  --  96 95*  CO2  --  28 22  GLUCOSE  --  100* 99  BUN  --  19 21  CREATININE 0.92 0.98 1.01  CALCIUM  --  9.2 9.1  MG 2.2  --   --    Liver Function Tests  Recent Labs  11/19/14 0215  AST 24  ALT 64*  ALKPHOS 109  BILITOT 0.3  PROT 6.4  ALBUMIN 2.9*   No results for input(s): LIPASE, AMYLASE in the last 72 hours. Cardiac Enzymes  Recent Labs  11/16/14 1049 11/16/14 1435 11/19/14 0215  CKTOTAL 218  --   --   CKMB 4.0  --   --   TROPONINI 2.26* 2.31*  2.44* 0.77*  Thyroid Function Tests  Recent Labs  11/17/14 0418  TSH 11.250*  Radiology Studies Imaging results have been reviewed and Dg Chest Portable 1 View  11/19/2014   CLINICAL DATA:  Chest pain, history of cardiomyopathy and dyslipidemia.  EXAM: PORTABLE CHEST - 1 VIEW  COMPARISON:  Chest radiograph November 16, 2014  FINDINGS: Cardiac silhouette remains mildly enlarged. Pulmonary vascular congestion and similar interstitial prominence without pleural effusion or focal consolidation. No pneumothorax. Remote RIGHT anterior rib fractures. Soft tissue planes are nonsuspicious.  IMPRESSION: Similar cardiomegaly, interstitial prominence most consistent with pulmonary edema without focal consolidation.   Electronically Signed   By: Awilda Metroourtnay  Bloomer   On: 11/19/2014 05:17    TELE Frequent PVCs/occasional couplets, no VT  ECG pending  ASSESSMENT AND PLAN  Physical findings and CXR are c/w postinfarction acute  pericarditis and acute systolic heart failure. BP is low - borderline criteria for cardiogenic shock, but on very high beta blocker dose.  Repeat echo, specifically looking for signs of ventricular wall rupture or pseudoaneurysm. Stop IV fluids. Diuretics. Reduce carvedilol dose slightly. Follow troponins.  Thurmon FairMihai Nyzaiah Kai, MD, [facility] [phone] office [phone] pager 11/19/2014 8:27 AM

## 2014-11-19 NOTE — ED Notes (Signed)
STEMI canceled. 

## 2014-11-19 NOTE — ED Provider Notes (Signed)
CSN: [id number]     Arrival date & time 11/19/14  0208 History  This chart was scribed for Kyle Booze, MD by Freida Busman, ED Scribe. This patient was seen in room [facility] and the patient's care was started 2:07 AM.    Chief Complaint  Patient presents with  . Code STEMI      The history is provided by the patient, the EMS personnel and medical records. No language interpreter was used.     HPI Comments:  Kyle Bullock is a 63 y.o. male brought in by ambulance with a h/o MI and cardiac stent placement about 1 week ago,  who presents to the Emergency Department complaining of 7/0 constant central chest pain that he describes as a pressure and heaviness. Pt states the pain woke him from his sleep around 0100 this am. His CP radiates into his left chest and left shoulder. Pain is exacerbated with inspiration. He reports associated SOB.  He denies nausea, vomiting and diaphoresis. Per EMS pt had 4 81mg  ASA and 2 NTG prior to their arrival to the pt with no relief. EMS reports BP of 105 systolic en route.      Past Medical History  Diagnosis Date  . Hypertension   . Ischemic cardiomyopathy     a. 11/2014: EF reportedly 20% by cath and echo in [place].  . VF (ventricular fibrillation) 11/10/14    a. 11/2014: arrest with STEMI.  Marland Kitchen Myocardial infarction 11/10/2014  . NSVT (nonsustained ventricular tachycardia) 11/16/14    a. 11/2014: admitted after discharge from STEMI admission, 18 beats 135-140bpm in ED.  Marland Kitchen CAD (coronary artery disease)     a. 11/2014: arrest/STEMI s/p Dx 1 POBA and DES to LAD [place], [place]).  Marland Kitchen Dyspnea     a. 11/2014: possibly due to combo of CHF and Brilinta. Brilinta changed to Effient.  . Chronic systolic CHF (congestive heart failure)   . Tobacco abuse   . RBBB   . Dyslipidemia   . Abnormal TSH    Past Surgical History  Procedure Laterality Date  . Coronary stent placement  11/10/14    LAD DES  . Hand surgery Bilateral   . Knee arthroscopy  1982    Family History  Problem Relation Age of Onset  . Hypertension     History  Substance Use Topics  . Smoking status: Current Every Day Smoker -- 2.00 packs/day for 40 years    Types: Cigarettes  . Smokeless tobacco: Never Used  . Alcohol Use: 0.0 oz/week    0 Not specified per week     Comment: daily    wine     Review of Systems  Constitutional: Negative for diaphoresis.  Respiratory: Positive for shortness of breath.   Cardiovascular: Positive for chest pain.  Gastrointestinal: Negative for nausea and vomiting.  All other systems reviewed and are negative.     Allergies  Brilinta  Home Medications   Prior to Admission medications   Medication Sig Start Date End Date Taking? Authorizing Provider  aspirin EC 81 MG tablet Take 81 mg by mouth daily.    Historical Provider, MD  atorvastatin (LIPITOR) 20 MG tablet Take 20 mg by mouth at bedtime.    Historical Provider, MD  carvedilol (COREG) 25 MG tablet Take 37.5 mg by mouth 2 (two) times daily with a meal.    Historical Provider, MD  furosemide (LASIX) 40 MG tablet Take 40 mg by mouth daily.    Historical Provider, MD  losartan (  COZAAR) 25 MG tablet Take 1 tablet (25 mg total) by mouth daily. 11/17/14   Dayna N Dunn, PA-C  nicotine (NICODERM CQ - DOSED IN MG/24 HOURS) 14 mg/24hr patch Place 1 patch (14 mg total) onto the skin daily. 11/17/14   Dayna N Dunn, PA-C  nitroGLYCERIN (NITROSTAT) 0.4 MG SL tablet Place 1 tablet (0.4 mg total) under the tongue every 5 (five) minutes as needed for chest pain (up to 3 doses). 11/17/14   Dayna N Dunn, PA-C  potassium chloride SA (K-DUR,KLOR-CON) 20 MEQ tablet Take 40 mEq by mouth daily.    Historical Provider, MD  prasugrel (EFFIENT) 10 MG TABS tablet Take 1 tablet (10 mg total) by mouth daily. 11/17/14   Dayna N Dunn, PA-C  zolpidem (AMBIEN) 10 MG tablet Take 1 tablet (10 mg total) by mouth at bedtime as needed for sleep. 11/17/14   Dayna N Dunn, PA-C   There were no vitals taken for  this visit. Physical Exam  Constitutional: He is oriented to person, place, and time. He appears well-developed and well-nourished.  HENT:  Head: Normocephalic and atraumatic.  Eyes: Conjunctivae are normal. Pupils are equal, round, and reactive to light.  Neck: Normal range of motion. Neck supple. No JVD present.  Cardiovascular: Normal rate, regular rhythm and normal heart sounds.   No murmur heard. Pulmonary/Chest: Breath sounds normal. He has no wheezes. He has no rales. He exhibits no tenderness.  Abdominal: Soft. Bowel sounds are normal. He exhibits no distension and no mass. There is no tenderness. There is no guarding.  Musculoskeletal: Normal range of motion. He exhibits no edema.  Lymphadenopathy:    He has no cervical adenopathy.  Neurological: He is alert and oriented to person, place, and time. He has normal reflexes. No cranial nerve deficit.  Skin: Skin is warm and dry. No rash noted.  Psychiatric: He has a normal mood and affect. His behavior is normal. Thought content normal.  Nursing note and vitals reviewed.   ED Course  Procedures  DIAGNOSTIC STUDIES:  Oxygen Saturation is 97% on [place], normal by my interpretation.    COORDINATION OF CARE:  2:19 AM Discussed treatment plan with pt at bedside and pt agreed to plan.  Labs Review Results for orders placed or performed during the hospital encounter of 11/19/14  Troponin I-(serum)  Result Value Ref Range   Troponin I 0.77 (HH) <0.30 ng/mL  Troponin I-(serum)  Result Value Ref Range   Troponin I 0.56 (HH) <0.30 ng/mL  Troponin I-(serum)  Result Value Ref Range   Troponin I 0.49 (HH) <0.30 ng/mL  Pro b natriuretic peptide (BNP)  Result Value Ref Range   Pro B Natriuretic peptide (BNP) 4569.0 (H) 0 - 125 pg/mL  CBC  Result Value Ref Range   WBC 9.9 4.0 - 10.5 K/uL   RBC 4.06 (L) 4.22 - 5.81 MIL/uL   Hemoglobin 12.5 (L) 13.0 - 17.0 g/dL   HCT 16.1 (L) 09.6 - 04.5 %   MCV 90.4 78.0 - 100.0 fL   MCH 30.8 26.0  - 34.0 pg   MCHC 34.1 30.0 - 36.0 g/dL   RDW 40.9 81.1 - 91.4 %   Platelets 329 150 - 400 K/uL  Comprehensive metabolic panel  Result Value Ref Range   Sodium 132 (L) 137 - 147 mEq/L   Potassium 4.2 3.7 - 5.3 mEq/L   Chloride 95 (L) 96 - 112 mEq/L   CO2 22 19 - 32 mEq/L   Glucose, Bld 99 70 -  99 mg/dL   BUN 21 6 - 23 mg/dL   Creatinine, Ser 4.091.01 0.50 - 1.35 mg/dL   Calcium 9.1 8.4 - 81.110.5 mg/dL   Total Protein 6.4 6.0 - 8.3 g/dL   Albumin 2.9 (L) 3.5 - 5.2 g/dL   AST 24 0 - 37 U/L   ALT 64 (H) 0 - 53 U/L   Alkaline Phosphatase 109 39 - 117 U/L   Total Bilirubin 0.3 0.3 - 1.2 mg/dL   GFR calc non Af Amer 77 (L) >90 mL/min   GFR calc Af Amer 89 (L) >90 mL/min   Anion gap 15 5 - 15  Heparin level (unfractionated)  Result Value Ref Range   Heparin Unfractionated <0.10 (L) 0.30 - 0.70 IU/mL  Basic metabolic panel  Result Value Ref Range   Sodium 135 (L) 137 - 147 mEq/L   Potassium 4.5 3.7 - 5.3 mEq/L   Chloride 97 96 - 112 mEq/L   CO2 23 19 - 32 mEq/L   Glucose, Bld 152 (H) 70 - 99 mg/dL   BUN 21 6 - 23 mg/dL   Creatinine, Ser 9.141.04 0.50 - 1.35 mg/dL   Calcium 9.2 8.4 - 78.210.5 mg/dL   GFR calc non Af Amer 74 (L) >90 mL/min   GFR calc Af Amer 86 (L) >90 mL/min   Anion gap 15 5 - 15  CBC  Result Value Ref Range   WBC 10.8 (H) 4.0 - 10.5 K/uL   RBC 4.07 (L) 4.22 - 5.81 MIL/uL   Hemoglobin 12.6 (L) 13.0 - 17.0 g/dL   HCT 95.636.8 (L) 21.339.0 - 08.652.0 %   MCV 90.4 78.0 - 100.0 fL   MCH 31.0 26.0 - 34.0 pg   MCHC 34.2 30.0 - 36.0 g/dL   RDW 57.812.9 46.911.5 - 62.915.5 %   Platelets 315 150 - 400 K/uL  I-stat troponin, ED (not at [facility])  Result Value Ref Range   Troponin i, poc 2.06 (HH) 0.00 - 0.08 ng/mL   Comment NOTIFIED PHYSICIAN    Comment 3             EKG Interpretation   Date/Time:  Sunday November 19 2014 02:14:29 EST Ventricular Rate:  71 PR Interval:  152 QRS Duration: 125 QT Interval:  500 QTC Calculation: 543 R Axis:   106 Text Interpretation:  Sinus rhythm Probable left  atrial enlargement Right  bundle branch block Anterior infarct, old Borderline ST elevation, lateral  leads Baseline wander in lead(s) V3 When compared with ECG of 11/16/2014,  Premature ventricular complexes are no longer Present Confirmed by [facility]   MD, Birgit Nowling ([id number]) on 11/19/2014 2:28:41 AM      CRITICAL CARE Performed by: LKGMW,NUUVOGLICK,Elon Lomeli Total critical care time: 35 minutes Critical care time was exclusive of separately billable procedures and treating other patients. Critical care was necessary to treat or prevent imminent or life-threatening deterioration. Critical care was time spent personally by me on the following activities: development of treatment plan with patient and/or surrogate as well as nursing, discussions with consultants, evaluation of patient's response to treatment, examination of patient, obtaining history from patient or surrogate, ordering and performing treatments and interventions, ordering and review of laboratory studies, ordering and review of radiographic studies, pulse oximetry and re-evaluation of patient's condition. MDM   Final diagnoses:  Acute coronary syndrome    Chest pain and patient one week post STEMI with stent placement. ECG does not show acute changes. Dr. Antoine PocheHochrein of cardiology service is here as patient arrives. Code  STEMI was canceled and Dr. Antoine PocheHochrein is assuming care for patient. He is being admitted.  I personally performed the services described in this documentation, which was scribed in my presence. The recorded information has been reviewed and is accurate.     Kyle Boozeavid Taye Cato, MD 11/19/14 [phone]

## 2014-11-19 NOTE — Progress Notes (Signed)
Chaplain responded to code stemi. Chaplain introduced herself to pt and pt wife. Chaplain coordinated with medical team and escorted pt [facility] area. Pt has had a "rough" two weeks. According to wife pt had a heart attack in [place] last Friday and [facility]. Chaplain offered emotional support, facioitated storytelling, and offered hospitality. Pt wife appreciated chaplain presence. Page chaplain as needed.    11/19/14 0500  Clinical Encounter Type  Visited With Patient and family together;Health care provider  Visit Type Code;ED  Spiritual Encounters  Spiritual Needs Emotional  Stress Factors  Patient Stress Factors Health changes  Family Stress Factors Health changes  Nyeshia Mysliwiec, Mayer MaskerCourtney F, Chaplain 11/19/2014 6:00 AM

## 2014-11-19 NOTE — ED Notes (Signed)
Cardiologist at bedside.  

## 2014-11-20 DIAGNOSIS — R072 Precordial pain: Principal | ICD-10-CM

## 2014-11-20 DIAGNOSIS — I472 Ventricular tachycardia: Secondary | ICD-10-CM

## 2014-11-20 DIAGNOSIS — I319 Disease of pericardium, unspecified: Secondary | ICD-10-CM | POA: Diagnosis not present

## 2014-11-20 DIAGNOSIS — I2102 ST elevation (STEMI) myocardial infarction involving left anterior descending coronary artery: Secondary | ICD-10-CM | POA: Diagnosis not present

## 2014-11-20 DIAGNOSIS — I2109 ST elevation (STEMI) myocardial infarction involving other coronary artery of anterior wall: Secondary | ICD-10-CM

## 2014-11-20 DIAGNOSIS — I059 Rheumatic mitral valve disease, unspecified: Secondary | ICD-10-CM | POA: Diagnosis not present

## 2014-11-20 DIAGNOSIS — I5041 Acute combined systolic (congestive) and diastolic (congestive) heart failure: Secondary | ICD-10-CM | POA: Diagnosis not present

## 2014-11-20 LAB — CBC
HEMATOCRIT: 33.6 % — AB (ref 39.0–52.0)
HEMOGLOBIN: 11.3 g/dL — AB (ref 13.0–17.0)
MCH: 30.5 pg (ref 26.0–34.0)
MCHC: 33.6 g/dL (ref 30.0–36.0)
MCV: 90.8 fL (ref 78.0–100.0)
Platelets: 317 10*3/uL (ref 150–400)
RBC: 3.7 MIL/uL — ABNORMAL LOW (ref 4.22–5.81)
RDW: 13 % (ref 11.5–15.5)
WBC: 9 10*3/uL (ref 4.0–10.5)

## 2014-11-20 LAB — PRO B NATRIURETIC PEPTIDE: Pro B Natriuretic peptide (BNP): 3711 pg/mL — ABNORMAL HIGH (ref 0–125)

## 2014-11-20 MED ORDER — CARVEDILOL 25 MG PO TABS
25.0000 mg | ORAL_TABLET | Freq: Two times a day (BID) | ORAL | Status: DC
  Administered 2014-11-20: 25 mg via ORAL
  Filled 2014-11-20 (×4): qty 1

## 2014-11-20 MED ORDER — HEPARIN SODIUM (PORCINE) 5000 UNIT/ML IJ SOLN
5000.0000 [IU] | Freq: Three times a day (TID) | INTRAMUSCULAR | Status: DC
  Administered 2014-11-20 – 2014-11-22 (×7): 5000 [IU] via SUBCUTANEOUS
  Filled 2014-11-20 (×10): qty 1

## 2014-11-20 MED ORDER — CARVEDILOL 12.5 MG PO TABS
12.5000 mg | ORAL_TABLET | Freq: Once | ORAL | Status: DC
  Filled 2014-11-20: qty 1

## 2014-11-20 NOTE — Progress Notes (Signed)
UR completed 

## 2014-11-20 NOTE — Progress Notes (Signed)
Coreg not given due to low blood pressures. Night shift will reevaluate.

## 2014-11-20 NOTE — Progress Notes (Addendum)
Subjective:  No further chest pain; No palpitations. No pleuritic symptoms  Objective:   Vital Signs in the last 24 hours: Temp:  [98.1 F (36.7 C)-98.8 F (37.1 C)] 98.2 F (36.8 C) (12/14 0712) Pulse Rate:  [64-137] 80 (12/14 0712) Resp:  [12-34] 30 (12/14 0712) BP: (88-113)/(54-71) 104/57 mmHg (12/14 0712) SpO2:  [90 %-97 %] 96 % (12/14 0712) Weight:  [184 lb 4.9 oz (83.6 kg)] 184 lb 4.9 oz (83.6 kg) (12/14 0600)  Intake/Output from previous day: 12/13 0701 - 12/14 0700 In: 884 [P.O.:780; I.V.:104] Out: 1225 [Urine:1225]  Medications: . aspirin EC  81 mg Oral Daily  . atorvastatin  20 mg Oral QHS  . carvedilol  12.5 mg Oral BID WC  . losartan  25 mg Oral Daily  . nicotine  14 mg Transdermal Daily  . potassium chloride SA  40 mEq Oral Daily  . prasugrel  10 mg Oral Daily  . sodium chloride  3 mL Intravenous Q12H       Physical Exam:   General appearance: alert, cooperative and no distress Neck: no adenopathy, no JVD, supple, symmetrical, trachea midline and thyroid not enlarged, symmetric, no tenderness/mass/nodules Lungs: decreased BS; no wheezing Heart: regular rate and rhythm and no s3 or s4 Abdomen: soft, non-tender; bowel sounds normal; no masses,  no organomegaly Extremities: no edema, redness or tenderness in the calves or thighs Pulses: 2+ and symmetric Neurologic: Grossly normal   Rate: 77  Rhythm: normal sinus rhythm   ECG:  NSR at 75 with RBBB and Ant Q waves c/w recent Ant MI  Lab Results:   Recent Labs  11/19/14 0215 11/19/14 1100  NA 132* 135*  K 4.2 4.5  CL 95* 97  CO2 22 23  GLUCOSE 99 152*  BUN 21 21  CREATININE 1.01 1.04     Recent Labs  11/19/14 1100 11/19/14 1628  TROPONINI 0.56* 0.49*    Hepatic Function Panel  Recent Labs  11/19/14 0215  PROT 6.4  ALBUMIN 2.9*  AST 24  ALT 64*  ALKPHOS 109  BILITOT 0.3   No results for input(s): INR in the last 72 hours. BNP (last 3 results)  Recent Labs   11/16/14 1435 11/19/14 0215  PROBNP 7684.0* 4569.0*    Lipid Panel  No results found for: CHOL, TRIG, HDL, CHOLHDL, VLDL, LDLCALC, LDLDIRECT    Imaging:  Dg Chest Portable 1 View  11/19/2014   CLINICAL DATA:  Chest pain, history of cardiomyopathy and dyslipidemia.  EXAM: PORTABLE CHEST - 1 VIEW  COMPARISON:  Chest radiograph November 16, 2014  FINDINGS: Cardiac silhouette remains mildly enlarged. Pulmonary vascular congestion and similar interstitial prominence without pleural effusion or focal consolidation. No pneumothorax. Remote RIGHT anterior rib fractures. Soft tissue planes are nonsuspicious.  IMPRESSION: Similar cardiomegaly, interstitial prominence most consistent with pulmonary edema without focal consolidation.   Electronically Signed   By: Awilda Metroourtnay  Bloomer   On: 11/19/2014 05:17      Assessment/Plan:   Active Problems:   Chest pain, precordial   Chest pain   Acute pericarditis   Acute combined systolic and diastolic heart failure   ST elevation myocardial infarction (STEMI) involving left anterior descending (LAD) coronary artery with complication  1. S/P Anterior MI 11/10/14 with DES to LAD; repeat cath yesterday with patent stent 2. Ischemic cardiomyopathy 3. NSVT; pt had a 5 and 11 beat run of NSVT this am ~7:15 and 7:30 4. RBBB 5 Tobacco;  Discussed smoking cessation. His last cigarette was Friday.  6. Life vest  Prior to admission he was on coreg at 37.5 mg bid, now at 12.5 bid. Will titrate to 25 mg bid. Although patient was hopeful for DC today, will keep today; monitor rhythm, BB titration. Patient has a life-vest. ? If may need transient amio if recurrent VT since 10 s/p MI. Echo to be done today. F/u BNP,  Mg.  Lennette Biharihomas A. Ary Lavine, MD, [facility] 11/20/2014, 8:01 AM

## 2014-11-20 NOTE — Progress Notes (Signed)
  Echocardiogram 2D Echocardiogram has been performed.  Delcie RochENNINGTON, Kyle Bullock 11/20/2014, 9:32 AM

## 2014-11-21 DIAGNOSIS — I255 Ischemic cardiomyopathy: Secondary | ICD-10-CM

## 2014-11-21 LAB — CBC
HEMATOCRIT: 33.9 % — AB (ref 39.0–52.0)
Hemoglobin: 11.3 g/dL — ABNORMAL LOW (ref 13.0–17.0)
MCH: 30.4 pg (ref 26.0–34.0)
MCHC: 33.3 g/dL (ref 30.0–36.0)
MCV: 91.1 fL (ref 78.0–100.0)
Platelets: 292 10*3/uL (ref 150–400)
RBC: 3.72 MIL/uL — ABNORMAL LOW (ref 4.22–5.81)
RDW: 13 % (ref 11.5–15.5)
WBC: 7.7 10*3/uL (ref 4.0–10.5)

## 2014-11-21 LAB — BASIC METABOLIC PANEL
Anion gap: 14 (ref 5–15)
BUN: 19 mg/dL (ref 6–23)
CO2: 22 mEq/L (ref 19–32)
CREATININE: 0.91 mg/dL (ref 0.50–1.35)
Calcium: 8.8 mg/dL (ref 8.4–10.5)
Chloride: 98 mEq/L (ref 96–112)
GFR calc Af Amer: >90 mL/min (ref >90)
GFR, EST NON AFRICAN AMERICAN: 88 mL/min — AB (ref >90)
GLUCOSE: 94 mg/dL (ref 70–99)
POTASSIUM: 4.5 meq/L (ref 3.7–5.3)
Sodium: 134 mEq/L — ABNORMAL LOW (ref 137–147)

## 2014-11-21 LAB — MAGNESIUM: Magnesium: 2.1 mg/dL (ref 1.5–2.5)

## 2014-11-21 MED ORDER — ALPRAZOLAM 0.25 MG PO TABS: 0.2500 mg | ORAL_TABLET | Freq: Every evening | ORAL | Status: DC | PRN

## 2014-11-21 MED ORDER — SENNOSIDES-DOCUSATE SODIUM 8.6-50 MG PO TABS: 1.0000 | ORAL_TABLET | Freq: Two times a day (BID) | ORAL | Status: DC | PRN

## 2014-11-21 MED ORDER — LOSARTAN POTASSIUM 25 MG PO TABS
25.0000 mg | ORAL_TABLET | Freq: Two times a day (BID) | ORAL | Status: DC
  Administered 2014-11-21 – 2014-11-22 (×2): 25 mg via ORAL
  Filled 2014-11-21 (×4): qty 1

## 2014-11-21 MED ORDER — CARVEDILOL 6.25 MG PO TABS
18.7500 mg | ORAL_TABLET | Freq: Two times a day (BID) | ORAL | Status: DC
  Administered 2014-11-21 – 2014-11-22 (×3): 18.75 mg via ORAL
  Filled 2014-11-21 (×5): qty 1

## 2014-11-21 MED ORDER — FUROSEMIDE 10 MG/ML IJ SOLN
20.0000 mg | Freq: Once | INTRAMUSCULAR | Status: AC
  Administered 2014-11-21: 20 mg via INTRAVENOUS
  Filled 2014-11-21: qty 2

## 2014-11-21 MED ORDER — FUROSEMIDE 20 MG PO TABS
20.0000 mg | ORAL_TABLET | Freq: Every day | ORAL | Status: DC
  Administered 2014-11-22: 20 mg via ORAL
  Filled 2014-11-21: qty 1

## 2014-11-21 NOTE — Progress Notes (Signed)
UR completed 

## 2014-11-21 NOTE — Progress Notes (Addendum)
Subjective:  No further chest pain; No palpitations. No pleuritic symptoms; but complains of dyspnea  Objective:   Vital Signs in the last 24 hours: Temp:  [97.4 F (36.3 C)-98.6 F (37 C)] 97.9 F (36.6 C) (12/15 0400) Pulse Rate:  [67-93] 70 (12/15 0600) Resp:  [12-33] 22 (12/15 0600) BP: (72-115)/(45-73) 106/51 mmHg (12/15 0600) SpO2:  [94 %-100 %] 98 % (12/15 0600) Weight:  [194 lb 14.2 oz (88.4 kg)] 194 lb 14.2 oz (88.4 kg) (12/15 0600)  Intake/Output from previous day: 12/14 0701 - 12/15 0700 In: 120 [P.O.:120] Out: 900 [Urine:900]   I/O since admission: -1108  Medications: . aspirin EC  81 mg Oral Daily  . atorvastatin  20 mg Oral QHS  . carvedilol  12.5 mg Oral Once  . carvedilol  25 mg Oral BID WC  . heparin subcutaneous  5,000 Units Subcutaneous 3 times per day  . losartan  25 mg Oral Daily  . nicotine  14 mg Transdermal Daily  . potassium chloride SA  40 mEq Oral Daily  . prasugrel  10 mg Oral Daily  . sodium chloride  3 mL Intravenous Q12H       Physical Exam:   General appearance: alert, cooperative and no distress Neck: no adenopathy, no JVD, supple, symmetrical, trachea midline and thyroid not enlarged, symmetric, no tenderness/mass/nodules Lungs: decreased BS; no wheezing; basilar rhonchi/rales  Heart: regular rate and rhythm and no s3 or s4 Abdomen: soft, non-tender; bowel sounds normal; no masses,  no organomegaly Extremities: no edema, redness or tenderness in the calves or thighs Pulses: 2+ and symmetric Neurologic: Grossly normal   Rate: 79  Rhythm: normal sinus rhythm   12/14 ECG (independently read by me):  NSR at 75 with RBBB and Ant Q waves c/w recent Ant MI  Lab Results:   Recent Labs  11/19/14 1100 11/21/14 0358  NA 135* 134*  K 4.5 4.5  CL 97 98  CO2 23 22  GLUCOSE 152* 94  BUN 21 19  CREATININE 1.04 0.91     Recent Labs  11/19/14 1100 11/19/14 1628  TROPONINI 0.56* 0.49*    Hepatic Function Panel  Recent  Labs  11/19/14 0215  PROT 6.4  ALBUMIN 2.9*  AST 24  ALT 64*  ALKPHOS 109  BILITOT 0.3   No results for input(s): INR in the last 72 hours. BNP (last 3 results)  Recent Labs  11/16/14 1435 11/19/14 0215 11/20/14 1000  PROBNP 7684.0* 4569.0* 3711.0*    Lipid Panel  No results found for: CHOL, TRIG, HDL, CHOLHDL, VLDL, LDLCALC, LDLDIRECT    Imaging:  11/20/14 EchoStudy Conclusions  - Left ventricle: Septal and apical akinesis. The cavity size was severely dilated. Wall thickness was normal. Systolic function was severely reduced. The estimated ejection fraction was in the range of 20% to 25%. Diffuse hypokinesis. - Mitral valve: There was mild regurgitation. - Atrial septum: No defect or patent foramen ovale was identified  Assessment/Plan:   Active Problems:   Chest pain, precordial   Chest pain   Acute pericarditis   Acute combined systolic and diastolic heart failure   ST elevation myocardial infarction (STEMI) involving left anterior descending (LAD) coronary artery with complication   ST elevation myocardial infarction (STEMI) involving other coronary artery of anterior wall  1. S/P Anterior MI 11/10/14 with DES to LAD; repeat cath with patent stent 2. Ischemic cardiomyopathy; echo with EF 20 - 25% 10 days post MI 3. NSVT; pt had a 5 and 11  beat run of NSVT yesterday; none since 4. RBBB 5 Tobacco;  Discussed smoking cessation. His last cigarette was Friday. 6. Life vest  Prior to admission he was on coreg at 37.5 mg bid, attempted to increase to 25 mg bid yesterday, but had only received 12.5 and 25 mg.  Will change to 18.75 mg bid today. Will change losartan to 25 mg bid. BNP is still elevated and basilar rales. Will give lasix 20mg  iv now and then initiate 20 mg orally daily. Will keep today ; transfer to TCU. F/U CXR in am. ? Dc tomorrow if improves with outpatient titration of meds and re-assessment of LV fxn 90 days post  revascularization.    Lennette Biharihomas A. Demiya Magno, MD, [facility] 11/21/2014, 8:00 AM

## 2014-11-21 NOTE — Progress Notes (Signed)
MD paged and made aware of pt low BP and Losartan still held. Continue to closely monitor BP and give medication once BP comes up.

## 2014-11-22 ENCOUNTER — Observation Stay (HOSPITAL_COMMUNITY): Payer: BC Managed Care – PPO

## 2014-11-22 LAB — CBC
HCT: 32.6 % — ABNORMAL LOW (ref 39.0–52.0)
HEMOGLOBIN: 11 g/dL — AB (ref 13.0–17.0)
MCH: 30.6 pg (ref 26.0–34.0)
MCHC: 33.7 g/dL (ref 30.0–36.0)
MCV: 90.6 fL (ref 78.0–100.0)
Platelets: 323 10*3/uL (ref 150–400)
RBC: 3.6 MIL/uL — AB (ref 4.22–5.81)
RDW: 12.9 % (ref 11.5–15.5)
WBC: 8.1 10*3/uL (ref 4.0–10.5)

## 2014-11-22 MED ORDER — FUROSEMIDE 20 MG PO TABS: 20.0000 mg | ORAL_TABLET | Freq: Every day | ORAL | Status: DC

## 2014-11-22 MED ORDER — FUROSEMIDE 20 MG PO TABS
20.0000 mg | ORAL_TABLET | Freq: Once | ORAL | Status: AC
  Administered 2014-11-22: 20 mg via ORAL
  Filled 2014-11-22: qty 1

## 2014-11-22 MED ORDER — CARVEDILOL 12.5 MG PO TABS: 18.7500 mg | ORAL_TABLET | Freq: Two times a day (BID) | ORAL | Status: DC

## 2014-11-22 NOTE — Progress Notes (Signed)
UR completed 

## 2014-11-22 NOTE — Discharge Summary (Signed)
Physician Discharge Summary  Patient ID: Kyle Bullock MRN: [id number] DOB/AGE: Jan 10, 1951 63 y.o.  Admit date: 11/19/2014 Discharge date: 11/22/2014   Primary Cardiologist: Dr. Excell Seltzerooper  Admission Diagnoses: Chest Pain   Discharge Diagnoses:  Active Problems:   Chest pain, precordial   Chest pain   Acute pericarditis   Acute combined systolic and diastolic heart failure   ST elevation myocardial infarction (STEMI) involving left anterior descending (LAD) coronary artery with complication   ST elevation myocardial infarction (STEMI) involving other coronary artery of anterior wall   Discharged Condition: stable  Hospital Course: The patient is a 63 y/o male, followed by Dr. Excell Seltzerooper, with recent history of MI, who [facility] with complaints of chest pain. He initially had an anterior MI and cardiac arrest in [place] on 11/10/14. [facility] and he had a VF arrest en route. He was shocked x 1 to NSR. Urgent cath revealed total LAD occlusion at the takeoff of the first diagonal with 95% stenosis of D1. He underwent POBA of the D1 and LAD DES with a Xience stent. His EF at cath and by echo was 20%. He was discharged 11/15/14 on ASA, Brilinta, Coreg, Lasix, Lipitor, Zerstril 2.5 mg. and a Technical sales engineerLife Vest. He was re-admitted on 12/10 with SOB. He was thought to have mild CHF. He improved after diuresis. Also his Brilinta was stopped and he was started on Effient. He did have mild troponin elevation, felt to be residual from the previous MI.   He [facility], less that 48 hrs later, 12/13 with complaints of chest pain. The pain was described to be unlike his previous angina. It was dull but with a sharp component. Located in the left upper chest and shoulder blade. It was also pleuritic. He denied associated N/V, dyspnea, orthopnea, PND, cough, fever or chills. In the ED his EKG demonstrated some minimal ST change (less than 1/2 mm  in the anterior precordial leads. However, on careful review of two prior EKGs this was not significantly changed. He was noted to have anterior Q waves. Bedside echo demonstrated anterior wall akinesis as previously described. However, there was no pericardial effusion. The decision was made to admit. Cardiac enzymes were cycled and were mildly elevated, felt to be residual from previous MI. However, later in the night, he developed increasing CP and he was taken back to the cath lab. This was performed by Dr. Allyson SabalBerry and showed a widely patent LAD stent. His other vessels appeared patent without evidence of a culprit lesion. Pericarditis was felt to be the cause of his pain. He clinically improved with treatment. He was continued on all of his medications. However, his Coreg was titrated downward due to soft BP. He was discharged home on 18.75 mg of Coreg. This will need to be titrated up to 25 mg BID as an OP, if BP allows. It was also suggested that his losartan be increased to 50 mg as an OP if can tolerate.  He was last seen and examined by Dr. Tresa EndoKelly, who determined he was stable for discharge home. He was instructed to continue with LifeVest. Post-hospital f/u is scheduled with Lucile Craterana Dunn 11/24/14.     Consults: None  Significant Diagnostic Studies:   LHC 11/19/14 HEMODYNAMICS:   AO SYSTOLIC/AO DIASTOLIC: 76/51   ANGIOGRAPHIC RESULTS:   1. Left main; normal  2. LAD; the LAD stent was widely patent as was the diagonal branch which arose just  proximal to it. There were no significant lesions in the LAD territory 3. Left circumflex; the circumflex was nondominant and was free of significant disease. There was a fairly long ramus branch that had minor irregularities in its proximal segment..  4. Right coronary artery; the right coronary artery was dominant and had 40-50% segmental stenosis in its midportion. 5. Left ventriculography;not performed   Treatments: See Hospital  Course  Discharge Exam: Blood pressure 99/64, pulse 79, temperature 97.5 F (36.4 C), temperature source Oral, resp. rate 18, height 6\' 3"  (1.905 m), weight 191 lb 14.4 oz (87.045 kg), SpO2 96 %.  Disposition: 01-Home or Self Care      Discharge Instructions    Diet - low sodium heart healthy    Complete by:  As directed      Increase activity slowly    Complete by:  As directed             Medication List    TAKE these medications        aspirin EC 81 MG tablet  Take 81 mg by mouth daily.     atorvastatin 20 MG tablet  Commonly known as:  LIPITOR  Take 20 mg by mouth at bedtime.     carvedilol 12.5 MG tablet  Commonly known as:  COREG  Take 1.5 tablets (18.75 mg total) by mouth 2 (two) times daily with a meal.     furosemide 20 MG tablet  Commonly known as:  LASIX  Take 1 tablet (20 mg total) by mouth daily.     losartan 25 MG tablet  Commonly known as:  COZAAR  Take 1 tablet (25 mg total) by mouth daily.     nicotine 14 mg/24hr patch  Commonly known as:  NICODERM CQ - dosed in mg/24 hours  Place 1 patch (14 mg total) onto the skin daily.     nitroGLYCERIN 0.4 MG SL tablet  Commonly known as:  NITROSTAT  Place 1 tablet (0.4 mg total) under the tongue every 5 (five) minutes as needed for chest pain (up to 3 doses).     potassium chloride SA 20 MEQ tablet  Commonly known as:  K-DUR,KLOR-CON  Take 40 mEq by mouth daily.     prasugrel 10 MG Tabs tablet  Commonly known as:  EFFIENT  Take 1 tablet (10 mg total) by mouth daily.     zolpidem 10 MG tablet  Commonly known as:  AMBIEN  Take 1 tablet (10 mg total) by mouth at bedtime as needed for sleep.       Follow-up Information    Follow up with Ronie Spiesayna Dunn, PA-C On 11/24/2014.   Specialty:  Cardiology   Why:  8:30 am    Contact information:   [address] [place] [address] [phone]      TIME SPENT ON DISCHARGE, INCLUDING PHYSICIAN TIME: >30 MINUTES   Signed: Robbie LisSIMMONS,  Catriona Dillenbeck 11/22/2014, 5:36 PM

## 2014-11-22 NOTE — Progress Notes (Addendum)
Subjective:  No further chest pain; No palpitations. No pleuritic symptoms; but complains of dyspnea  Objective:   Vital Signs in the last 24 hours: Temp:  [98.1 F (36.7 C)-98.7 F (37.1 C)] 98.1 F (36.7 C) (12/16 0736) Resp:  [16-19] 18 (12/16 0736) BP: (80-111)/(47-73) 111/69 mmHg (12/16 0811) SpO2:  [92 %-98 %] 95 % (12/16 0736) Weight:  [191 lb 14.4 oz (87.045 kg)] 191 lb 14.4 oz (87.045 kg) (12/16 0428)  Intake/Output from previous day: 12/15 0701 - 12/16 0700 In: 1286 [P.O.:1280; I.V.:6] Out: 1375 [Urine:1375]   I/O since admission: -1197 (-89 cc yesterday)    Medications: . aspirin EC  81 mg Oral Daily  . atorvastatin  20 mg Oral QHS  . carvedilol  18.75 mg Oral BID WC  . furosemide  20 mg Oral Daily  . heparin subcutaneous  5,000 Units Subcutaneous 3 times per day  . losartan  25 mg Oral BID  . potassium chloride SA  40 mEq Oral Daily  . prasugrel  10 mg Oral Daily  . sodium chloride  3 mL Intravenous Q12H       Physical Exam:   General appearance: alert, cooperative and no distress Neck: no adenopathy, no JVD, supple, symmetrical, trachea midline and thyroid not enlarged, symmetric, no tenderness/mass/nodules Lungs: decreased BS; no wheezing; basilar rhonchi/rales improved Heart: regular rate and rhythm and no s3 or s4 Abdomen: soft, non-tender; bowel sounds normal; no masses,  no organomegaly Extremities: no edema, redness or tenderness in the calves or thighs Pulses: 2+ and symmetric Neurologic: Grossly normal   Rate: 79  Rhythm: normal sinus rhythm   12/14 ECG (independently read by me):  NSR at 75 with RBBB and Ant Q waves c/w recent Ant MI  Lab Results:   Recent Labs  11/19/14 1100 11/21/14 0358  NA 135* 134*  K 4.5 4.5  CL 97 98  CO2 23 22  GLUCOSE 152* 94  BUN 21 19  CREATININE 1.04 0.91     Recent Labs  11/19/14 1100 11/19/14 1628  TROPONINI 0.56* 0.49*    Hepatic Function Panel No results for input(s): PROT, ALBUMIN,  AST, ALT, ALKPHOS, BILITOT, BILIDIR, IBILI in the last 72 hours. No results for input(s): INR in the last 72 hours. BNP (last 3 results)  Recent Labs  11/16/14 1435 11/19/14 0215 11/20/14 1000  PROBNP 7684.0* 4569.0* 3711.0*    Lipid Panel  No results found for: CHOL, TRIG, HDL, CHOLHDL, VLDL, LDLCALC, LDLDIRECT    Imaging:  11/20/14 EchoStudy Conclusions  - Left ventricle: Septal and apical akinesis. The cavity size was severely dilated. Wall thickness was normal. Systolic function was severely reduced. The estimated ejection fraction was in the range of 20% to 25%. Diffuse hypokinesis. - Mitral valve: There was mild regurgitation. - Atrial septum: No defect or patent foramen ovale was identified  CXR 12/16 IMPRESSION: Persistent CHF, probably slightly increased when accounting for differences in technique   Assessment/Plan:   Active Problems:   Chest pain, precordial   Chest pain   Acute pericarditis   Acute combined systolic and diastolic heart failure   ST elevation myocardial infarction (STEMI) involving left anterior descending (LAD) coronary artery with complication   ST elevation myocardial infarction (STEMI) involving other coronary artery of anterior wall  1. S/P Anterior MI 11/10/14 with DES to LAD; repeat cath with patent stent 2. Ischemic cardiomyopathy; echo with EF 20 - 25% 10 days post MI 3. NSVT; pt had a 5 and 11 beat run  of NSVT yesterday; none since 4. RBBB 5 Tobacco;  Discussed smoking cessation. His last cigarette was Friday. 6. Life vest  Clinically feels much better today. Lasix restarted; will give a total 40 mg dose today (recieved 20 mg earlier and will give an additional 20 mg now). Significant improvement in basilar rales. Tolerating increased ARB with 25 mg bid losartan.  Will slowly titrate as outpatient to 50 mg bid and also titrate coreg as outpatient to 25 mg bid.  Pt is anxiously wanting dc today.  Ambulating well. Will dc later  today. He has an appoitment to see Kyle Bullock in office this Friday will keep appointment and further med titration at that time depending on status. DC with life vest.  [name], MD, [facility] 11/22/2014, 10:54 AM

## 2014-11-23 ENCOUNTER — Encounter: Payer: Self-pay | Admitting: *Deleted

## 2014-11-23 DIAGNOSIS — Z87891 Personal history of nicotine dependence: Secondary | ICD-10-CM | POA: Insufficient documentation

## 2014-11-23 DIAGNOSIS — I5022 Chronic systolic (congestive) heart failure: Secondary | ICD-10-CM | POA: Insufficient documentation

## 2014-11-23 NOTE — Telephone Encounter (Signed)
TCM call to patient spoke to wife.She stated husband is doing ok.Stated he is weak and tired like he was told.Stated he is taking medications as prescribed and understands discharge instructions.Stated he will keep appointment with pharmacist 11/24/14 at 8:00 am and at 8:30 am with PA.

## 2014-11-23 NOTE — Progress Notes (Addendum)
[facility], [place], [place]  [address] Phone: [phone] Fax:  [phone]  Date:  11/24/2014   Patient ID:  Kyle Bullock, Kyle Bullock July 04, 1951, MRN [id number]   PCP:  No primary care provider on file.  Cardiologist:  Burt Knack Electrophysiologist: Allred  History of Present Illness: Kyle Bullock is a 63 y.o. male with history of recent CAD/VF arrest s/p PCI, ICM EF 20%, HTN, tobacco abuse, dyslipidemia, RBBB recent readmit for NSVT, and a second readmit for SOB/CP (?pericarditis) who presents for post-hospital followup.   He and his wife were in [place] visiting on 11/10/14 when he developed chest pain. EMS was called. En route to [place] he had a VF arrest. He was shocked x 1 to NSR. In the ER STEMI was called, EKG revealed RBBB with AL ST elevation. Urgent cath revealed total LAD at the takeoff of the Dx1 with 95% Dx1. He underwent Dx1 POBA and LAD DES with a Xience stent. His EF at cath and by echo was 20%. His post MI course was unremarkable. He was discharged 11/15/14 on ASA, Brilinta, Coreg, Lasix, Lipitor, Zerstril 2.5 mg. and a LifeVest. He lives here locally and had an appointment to establish care in our office. However, before that appointment he presented back to the ED with intermittent SOB and anxiety but no recurrent CP. In the ER here at [facility], he had 18 bt of NSVT (rate 135-140 bpm) - his Life Vest did not fire. Dyspnea was felt multifactorial from Brilinta and acute systolic CHF so he was also treated with IV Lasix. Troponins went from POC 7.35 but full set 2.26-2.44-2.31 - Dr. Burt Knack felt this was residual elevation from the recent infarct with an essentially flat trend. Dr. Rayann Heman with EP consulted on the case. He reviewed the lifevest which showed no arrhythmias > 150 bpm and no therapies delivered. He recommended to keep K>3.9 and Mg>1.9. (K level 4.2, 3.9 this adm). The patient was instructed not to drive for 6 months. Dr. Rayann Heman  reported "Though we could consider EP study and possible ICD if inducible ventricular arrhythmias are found, I think that a more conservative approach would be to continue medical therapy with lifevest in place and then reassess EF after 90 days post revascularization." The patient agreed with this plan. Lisinopril was changed to ARB for dry cough.  He was due to f/u today in clinic with me following that discharge but returned back to the hospital 12/13-12/16 with precordial chest pain eventually felt due to pericarditis. Relook cath showed a widely patent stent without evidence of a culprit lesion. Echo 11/20/14: septal and apical akinesis, LV cavity severely dilated, EF 20-25%, diffuse HK, mild MR, normal pericardial appearance. His Coreg was titrated down due to hypotension. He was otherwise discharged on prior home meds. He has a f/u appt in January with EP to discuss further plans for evaluation of ICD candidacy.  He felt well the day of discharge as well as yesterday, but today feels a little weaker. He continues to have dyspnea that comes and goes. He also has had continued chest soreness since his hospitalization similar to before his cardiac cath. This is not made worse by anything including exertion, palpation or inspiration. He is not tachycardic, tachypnic or hypoxic. He reports continued orthopnea ever since his initial hospitalization. He is not sleeping well - has insomnia and Ambien only helps so much. His wife is concerned he has sleep apnea. Weight is stable  at home. Denies any bleeding.  Recent Labs: 11/17/2014: TSH 11.250* 11/19/2014: ALT 64* 11/20/2014: Pro B Natriuretic peptide (BNP) 3711.0* 11/21/2014: Creatinine 0.91; Potassium 4.5 11/22/2014: Hemoglobin 11.0*  Wt Readings from Last 3 Encounters:  11/24/14 193 lb 9.6 oz (87.816 kg)  11/22/14 191 lb 14.4 oz (87.045 kg)  11/17/14 189 lb 6.4 oz (85.911 kg)     Past Medical History  Diagnosis Date  . Hypertension   .  Ischemic cardiomyopathy     a. 11/2014: EF reportedly 20% by cath and echo in [place].  . VF (ventricular fibrillation) 11/10/14    a. 11/2014: arrest with STEMI.  Kyle Bullock Myocardial infarction 11/10/2014  . NSVT (nonsustained ventricular tachycardia) 11/16/14    a. 11/2014: admitted after discharge from STEMI admission, 18 beats 135-140bpm in ED.  Kyle Bullock CAD (coronary artery disease)     a. 11/2014: arrest/STEMI s/p Dx 1 POBA and DES to LAD [place], [place]). b. Relook cath 11/2014: no culprit, patent stent.  Kyle Bullock Dyspnea     a. 11/2014: possibly due to combo of CHF and Brilinta. Brilinta changed to Effient.  . Tobacco abuse   . RBBB   . Dyslipidemia   . Abnormal TSH     a. 11/2014: felt d/t sick euthyroid.  Kyle Bullock Pericarditis     Current Outpatient Prescriptions  Medication Sig Dispense Refill  . aspirin EC 81 MG tablet Take 81 mg by mouth daily.    Kyle Bullock atorvastatin (LIPITOR) 20 MG tablet Take 20 mg by mouth at bedtime.    . carvedilol (COREG) 12.5 MG tablet Take 1.5 tablets (18.75 mg total) by mouth 2 (two) times daily with a meal. 90 tablet 5  . furosemide (LASIX) 20 MG tablet Take 1 tablet (20 mg total) by mouth daily. 30 tablet 5  . losartan (COZAAR) 25 MG tablet Take 1 tablet (25 mg total) by mouth daily. 30 tablet 6  . nicotine (NICODERM CQ - DOSED IN MG/24 HOURS) 14 mg/24hr patch Place 1 patch (14 mg total) onto the skin daily. (Patient not taking: Reported on 11/19/2014) 28 patch 0  . nitroGLYCERIN (NITROSTAT) 0.4 MG SL tablet Place 1 tablet (0.4 mg total) under the tongue every 5 (five) minutes as needed for chest pain (up to 3 doses). 25 tablet 3  . potassium chloride SA (K-DUR,KLOR-CON) 20 MEQ tablet Take 40 mEq by mouth daily.    . prasugrel (EFFIENT) 10 MG TABS tablet Take 1 tablet (10 mg total) by mouth daily. 30 tablet 11  . tadalafil (CIALIS) 20 MG tablet Take 20 mg by mouth as needed for erectile dysfunction.    Kyle Bullock zolpidem (AMBIEN) 10 MG tablet Take 1 tablet (10 mg total) by mouth at  bedtime as needed for sleep. (Patient not taking: Reported on 11/24/2014) 12 tablet 0   No current facility-administered medications for this visit.    Allergies:   Lisinopril and Brilinta   Social History:  The patient  reports that he has quit smoking. His smoking use included Cigarettes. He has a 80 pack-year smoking history. He has never used smokeless tobacco. He reports that he drinks alcohol. He reports that he does not use illicit drugs.   Family History:  The patient's family history includes Hypertension in an other family member. There is no history of CAD.  ROS:  Please see the history of present illness.   All other systems reviewed and negative.   PHYSICAL EXAM:  VS:  BP 102/66 mmHg  Pulse 83  Resp 14  Ht 6'  3" (1.905 m)  Wt 193 lb 9.6 oz (87.816 kg)  BMI 24.20 kg/m2  SpO2 97% - right hand 94%, left hand 96-97% Well nourished, well developed WM, in no acute distress HEENT: normal Neck: no JVD Cardiac:  normal S1, S2; RRR; no murmur, no rub Lungs:  clear to auscultation bilaterally, no wheezing, rhonchi or rales Abd: soft, nontender, no hepatomegaly Ext: no edema, right radial cath site without hematoma or ecchymosis Skin: warm and dry Neuro:  moves all extremities spontaneously, no focal abnormalities noted  EKG:  NSR 83bpm, possible LAE, incomplete RBBB, prior septal and lateral infarct, nonspecific ST-T changes, stable compared to prior tracings (most similar to earlier December tracings)  ASSESSMENT AND PLAN:  1. CAD with recent STEMI s/p DES to LAD, POBA Dx 1 complicated by possible recent acute pericarditis - continue ASA, BB, Effient, statin. He does have some residual chest discomfort similar to last hospitalization when cath showed patent stent and no culprit. EKG is stable - pain is not worse with recumbency and he does not have a rub on exam. I wonder if this is r/t his VF arrest/CPR - I have asked him to monitor for further recurrence/increase in symptoms  and report to ED if feeling worse. Will check ESR. Given that he is still c/o intermittent weakness will not increase statin at this time so as not to muddy the waters. If he is feeling better at f/u appointment, would consider titrating statin at that time. 2. Chronic systolic CHF/ICM with intermittent dyspnea/orthopnea - he does not appear volume overloaded on exam but recent pBNP was up. He was sent home on lower dose of oral Lasix at discharge due to soft BPs. Wife reports stable weight at home. Will recheck BMET, BNP, CBC today to further assess continued dyspnea. He also has a 30-40 yr history of tobacco so some of this may be COPD coming to light now that he is in surveillance-mode for further symptoms. I will consider discussing changing Coreg to Metoprolol with primary cardiologist based on lab outcome. If he requires additional diuresis I think we'll need to pull back on some of his other antihypertensives because his BP tends to run soft. His wife is also concerned for sleep disordered breathing and we will refer him for sleep apnea and also to assess overnight O2 needs. Given that benzos can worsen possible OSA, I have held off on changing Ambien to something like Xanax for now to avoid a false positive. 3. VF arrest, recent NSVT, on Lifevest - has f/u with EP already scheduled. 4. HTN with recent hypotension. 5. Tobacco abuse, currently abstinent. 6. ED - he was counseled by our pharmacist today not to use Cialis and NTG within 24 hrs of one another.  Dispo: F/u 1-2 weeks with Dr. Burt Knack or myself. F/u as planned with EP in January to discuss timing of f/u echo and ICD discussion.  Signed, Melina Copa, PA-C  11/24/2014 9:02 AM   Addendum: after getting labs drawn, the phlebotomist noted the patient looked somewhat gray. He also began to complain of 10/10 CP. BP stable at 102/70. I discussed the patient in depth with Dr. Marlou Porch who also assessed patient with me. EKG unchanged from previous. 1  SL NTG given with no relief of chest pain, continues 10/10. BP 88/52. He took all his AM meds this morning. Will transfer him to [facility] and admit. Stepdown beds not available so given ongoing CP will admit to ICU. Since none of his  bloodwork will be back in time from the office will plan to check CBC, BMET, BNP, ESR, and cycle troponins. If Cr is OK will obtain CTA to exclude PE since cath just a few days ago did not show any culprit lesions. Will decrease Coreg dose and hold further doses of Losartan. Based on BP response may consider adding Imdur. Hold off on heparin for now unless enzymes turn positive.   Arieliz Latino PA-C 11/24/2014 10:53 AM

## 2014-11-24 ENCOUNTER — Observation Stay (HOSPITAL_COMMUNITY): Payer: BC Managed Care – PPO

## 2014-11-24 ENCOUNTER — Inpatient Hospital Stay (HOSPITAL_COMMUNITY)
Admission: AD | Admit: 2014-11-24 | Discharge: 2014-11-27 | DRG: 281 | Disposition: A | Discharge status: Home / Self Care | Payer: BC Managed Care – PPO | Source: Ambulatory Visit | Attending: Cardiology | Admitting: Cardiology

## 2014-11-24 ENCOUNTER — Ambulatory Visit (INDEPENDENT_AMBULATORY_CARE_PROVIDER_SITE_OTHER): Payer: BC Managed Care – PPO | Admitting: Physician Assistant

## 2014-11-24 ENCOUNTER — Other Ambulatory Visit: Payer: Self-pay | Admitting: *Deleted

## 2014-11-24 ENCOUNTER — Encounter: Payer: Self-pay | Admitting: Physician Assistant

## 2014-11-24 ENCOUNTER — Encounter (HOSPITAL_COMMUNITY): Payer: Self-pay | Admitting: *Deleted

## 2014-11-24 ENCOUNTER — Ambulatory Visit (INDEPENDENT_AMBULATORY_CARE_PROVIDER_SITE_OTHER): Payer: BC Managed Care – PPO | Admitting: Pharmacist

## 2014-11-24 VITALS — BP 102/66 | HR 83 | Resp 14 | Ht 75.0 in | Wt 193.6 lb

## 2014-11-24 DIAGNOSIS — I472 Ventricular tachycardia: Secondary | ICD-10-CM

## 2014-11-24 DIAGNOSIS — Z9861 Coronary angioplasty status: Secondary | ICD-10-CM

## 2014-11-24 DIAGNOSIS — Z7902 Long term (current) use of antithrombotics/antiplatelets: Secondary | ICD-10-CM

## 2014-11-24 DIAGNOSIS — I255 Ischemic cardiomyopathy: Secondary | ICD-10-CM

## 2014-11-24 DIAGNOSIS — I2102 ST elevation (STEMI) myocardial infarction involving left anterior descending coronary artery: Secondary | ICD-10-CM

## 2014-11-24 DIAGNOSIS — I3139 Other pericardial effusion (noninflammatory): Secondary | ICD-10-CM | POA: Diagnosis present

## 2014-11-24 DIAGNOSIS — Z7982 Long term (current) use of aspirin: Secondary | ICD-10-CM

## 2014-11-24 DIAGNOSIS — I5022 Chronic systolic (congestive) heart failure: Secondary | ICD-10-CM

## 2014-11-24 DIAGNOSIS — R071 Chest pain on breathing: Secondary | ICD-10-CM

## 2014-11-24 DIAGNOSIS — R0600 Dyspnea, unspecified: Secondary | ICD-10-CM

## 2014-11-24 DIAGNOSIS — E785 Hyperlipidemia, unspecified: Secondary | ICD-10-CM | POA: Diagnosis present

## 2014-11-24 DIAGNOSIS — G4700 Insomnia, unspecified: Secondary | ICD-10-CM | POA: Diagnosis present

## 2014-11-24 DIAGNOSIS — I4729 Other ventricular tachycardia: Secondary | ICD-10-CM

## 2014-11-24 DIAGNOSIS — I313 Pericardial effusion (noninflammatory): Secondary | ICD-10-CM | POA: Diagnosis present

## 2014-11-24 DIAGNOSIS — R072 Precordial pain: Secondary | ICD-10-CM | POA: Diagnosis present

## 2014-11-24 DIAGNOSIS — R0609 Other forms of dyspnea: Secondary | ICD-10-CM | POA: Diagnosis present

## 2014-11-24 DIAGNOSIS — R079 Chest pain, unspecified: Secondary | ICD-10-CM

## 2014-11-24 DIAGNOSIS — Z8674 Personal history of sudden cardiac arrest: Secondary | ICD-10-CM

## 2014-11-24 DIAGNOSIS — Z87891 Personal history of nicotine dependence: Secondary | ICD-10-CM

## 2014-11-24 DIAGNOSIS — Z888 Allergy status to other drugs, medicaments and biological substances status: Secondary | ICD-10-CM

## 2014-11-24 DIAGNOSIS — I959 Hypotension, unspecified: Secondary | ICD-10-CM

## 2014-11-24 DIAGNOSIS — Z8249 Family history of ischemic heart disease and other diseases of the circulatory system: Secondary | ICD-10-CM

## 2014-11-24 DIAGNOSIS — I10 Essential (primary) hypertension: Secondary | ICD-10-CM

## 2014-11-24 DIAGNOSIS — I241 Dressler's syndrome: Principal | ICD-10-CM | POA: Diagnosis present

## 2014-11-24 DIAGNOSIS — I4510 Unspecified right bundle-branch block: Secondary | ICD-10-CM | POA: Diagnosis present

## 2014-11-24 DIAGNOSIS — I4901 Ventricular fibrillation: Secondary | ICD-10-CM

## 2014-11-24 DIAGNOSIS — I2510 Atherosclerotic heart disease of native coronary artery without angina pectoris: Secondary | ICD-10-CM

## 2014-11-24 DIAGNOSIS — I309 Acute pericarditis, unspecified: Secondary | ICD-10-CM

## 2014-11-24 DIAGNOSIS — Z955 Presence of coronary angioplasty implant and graft: Secondary | ICD-10-CM

## 2014-11-24 DIAGNOSIS — Z720 Tobacco use: Secondary | ICD-10-CM

## 2014-11-24 DIAGNOSIS — I2109 ST elevation (STEMI) myocardial infarction involving other coronary artery of anterior wall: Secondary | ICD-10-CM | POA: Diagnosis present

## 2014-11-24 HISTORY — DX: Sleep apnea, unspecified: G47.30

## 2014-11-24 LAB — BASIC METABOLIC PANEL
Anion gap: 13 (ref 5–15)
BUN: 19 mg/dL (ref 6–23)
BUN: 20 mg/dL (ref 6–23)
CALCIUM: 9.3 mg/dL (ref 8.4–10.5)
CHLORIDE: 99 meq/L (ref 96–112)
CO2: 25 mEq/L (ref 19–32)
CO2: 25 mEq/L (ref 19–32)
Calcium: 9 mg/dL (ref 8.4–10.5)
Chloride: 97 mEq/L (ref 96–112)
Creatinine, Ser: 1.1 mg/dL (ref 0.4–1.5)
Creatinine, Ser: 1.06 mg/dL (ref 0.50–1.35)
GFR calc Af Amer: 84 mL/min — ABNORMAL LOW (ref >90)
GFR calc non Af Amer: 72 mL/min — ABNORMAL LOW (ref >90)
GFR: 68.54 mL/min (ref >60.00)
GLUCOSE: 132 mg/dL — AB (ref 70–99)
Glucose, Bld: 132 mg/dL — ABNORMAL HIGH (ref 70–99)
Potassium: 4.6 mEq/L (ref 3.5–5.1)
Potassium: 5.4 mEq/L — ABNORMAL HIGH (ref 3.7–5.3)
SODIUM: 135 meq/L — AB (ref 137–147)
Sodium: 134 mEq/L — ABNORMAL LOW (ref 135–145)

## 2014-11-24 LAB — CBC WITH DIFFERENTIAL/PLATELET
Basophils Absolute: 0 10*3/uL (ref 0.0–0.1)
Basophils Relative: 0 % (ref 0–1)
Eosinophils Absolute: 0 10*3/uL (ref 0.0–0.7)
Eosinophils Relative: 0 % (ref 0–5)
HCT: 36.9 % — ABNORMAL LOW (ref 39.0–52.0)
HEMOGLOBIN: 12.5 g/dL — AB (ref 13.0–17.0)
LYMPHS ABS: 0.7 10*3/uL (ref 0.7–4.0)
Lymphocytes Relative: 6 % — ABNORMAL LOW (ref 12–46)
MCH: 30.9 pg (ref 26.0–34.0)
MCHC: 33.9 g/dL (ref 30.0–36.0)
MCV: 91.3 fL (ref 78.0–100.0)
Monocytes Absolute: 1.8 10*3/uL — ABNORMAL HIGH (ref 0.1–1.0)
Monocytes Relative: 14 % — ABNORMAL HIGH (ref 3–12)
NEUTROS ABS: 10.1 10*3/uL — AB (ref 1.7–7.7)
NEUTROS PCT: 80 % — AB (ref 43–77)
Platelets: 439 10*3/uL — ABNORMAL HIGH (ref 150–400)
RBC: 4.04 MIL/uL — ABNORMAL LOW (ref 4.22–5.81)
RDW: 12.9 % (ref 11.5–15.5)
WBC: 12.7 10*3/uL — ABNORMAL HIGH (ref 4.0–10.5)

## 2014-11-24 LAB — CBC
HEMATOCRIT: 38.8 % — AB (ref 39.0–52.0)
HEMOGLOBIN: 13 g/dL (ref 13.0–17.0)
MCHC: 33.6 g/dL (ref 30.0–36.0)
MCV: 92.1 fl (ref 78.0–100.0)
PLATELETS: 452 10*3/uL — AB (ref 150.0–400.0)
RBC: 4.21 Mil/uL — ABNORMAL LOW (ref 4.22–5.81)
RDW: 13.3 % (ref 11.5–15.5)
WBC: 11 10*3/uL — ABNORMAL HIGH (ref 4.0–10.5)

## 2014-11-24 LAB — T4, FREE: Free T4: 1.21 ng/dL (ref 0.80–1.80)

## 2014-11-24 LAB — PRO B NATRIURETIC PEPTIDE: PRO B NATRI PEPTIDE: 6414 pg/mL — AB (ref 0–125)

## 2014-11-24 LAB — PROTIME-INR
INR: 1.17 (ref 0.00–1.49)
Prothrombin Time: 15.1 seconds (ref 11.6–15.2)

## 2014-11-24 LAB — SEDIMENTATION RATE
SED RATE: 82 mm/h — AB (ref 0–22)
Sed Rate: 80 mm/hr — ABNORMAL HIGH (ref 0–16)

## 2014-11-24 LAB — TROPONIN I
Troponin I: <0.3 ng/mL (ref <0.30)
Troponin I: <0.3 ng/mL (ref <0.30)

## 2014-11-24 LAB — MAGNESIUM: MAGNESIUM: 2.4 mg/dL (ref 1.5–2.5)

## 2014-11-24 LAB — TSH: TSH: 9.7 u[IU]/mL — AB (ref 0.350–4.500)

## 2014-11-24 MED ORDER — NITROGLYCERIN 0.4 MG SL SUBL: 0.4000 mg | SUBLINGUAL_TABLET | SUBLINGUAL | Status: DC | PRN

## 2014-11-24 MED ORDER — FUROSEMIDE 20 MG PO TABS
20.0000 mg | ORAL_TABLET | Freq: Every day | ORAL | Status: DC
  Administered 2014-11-25: 20 mg via ORAL
  Filled 2014-11-24 (×2): qty 1

## 2014-11-24 MED ORDER — HEPARIN SODIUM (PORCINE) 5000 UNIT/ML IJ SOLN
5000.0000 [IU] | Freq: Three times a day (TID) | INTRAMUSCULAR | Status: DC
  Administered 2014-11-24 – 2014-11-27 (×9): 5000 [IU] via SUBCUTANEOUS
  Filled 2014-11-24 (×13): qty 1

## 2014-11-24 MED ORDER — CETYLPYRIDINIUM CHLORIDE 0.05 % MT LIQD
7.0000 mL | Freq: Two times a day (BID) | OROMUCOSAL | Status: DC
  Administered 2014-11-24 – 2014-11-27 (×6): 7 mL via OROMUCOSAL

## 2014-11-24 MED ORDER — MORPHINE SULFATE 2 MG/ML IJ SOLN
INTRAMUSCULAR | Status: AC
  Administered 2014-11-24: 2 mg via INTRAVENOUS
  Filled 2014-11-24: qty 1

## 2014-11-24 MED ORDER — ATORVASTATIN CALCIUM 20 MG PO TABS
20.0000 mg | ORAL_TABLET | Freq: Every day | ORAL | Status: DC
  Administered 2014-11-24 – 2014-11-26 (×3): 20 mg via ORAL
  Filled 2014-11-24 (×4): qty 1

## 2014-11-24 MED ORDER — SODIUM CHLORIDE 0.9 % IJ SOLN: 3.0000 mL | INTRAMUSCULAR | Status: DC | PRN

## 2014-11-24 MED ORDER — IOHEXOL 350 MG/ML SOLN
100.0000 mL | Freq: Once | INTRAVENOUS | Status: AC | PRN
  Administered 2014-11-24: 100 mL via INTRAVENOUS

## 2014-11-24 MED ORDER — COLCHICINE 0.6 MG PO TABS
0.6000 mg | ORAL_TABLET | Freq: Two times a day (BID) | ORAL | Status: DC
  Administered 2014-11-24 – 2014-11-27 (×6): 0.6 mg via ORAL
  Filled 2014-11-24 (×7): qty 1

## 2014-11-24 MED ORDER — SODIUM CHLORIDE 0.9 % IJ SOLN
3.0000 mL | Freq: Two times a day (BID) | INTRAMUSCULAR | Status: DC
  Administered 2014-11-24 – 2014-11-27 (×7): 3 mL via INTRAVENOUS

## 2014-11-24 MED ORDER — ZOLPIDEM TARTRATE 5 MG PO TABS: 10.0000 mg | ORAL_TABLET | Freq: Every evening | ORAL | Status: DC | PRN

## 2014-11-24 MED ORDER — ONDANSETRON HCL 4 MG/2ML IJ SOLN: 4.0000 mg | Freq: Four times a day (QID) | INTRAMUSCULAR | Status: DC | PRN

## 2014-11-24 MED ORDER — ACETAMINOPHEN 325 MG PO TABS: 650.0000 mg | ORAL_TABLET | ORAL | Status: DC | PRN

## 2014-11-24 MED ORDER — PRASUGREL HCL 10 MG PO TABS
10.0000 mg | ORAL_TABLET | Freq: Every day | ORAL | Status: DC
  Administered 2014-11-25 – 2014-11-27 (×3): 10 mg via ORAL
  Filled 2014-11-24 (×4): qty 1

## 2014-11-24 MED ORDER — CARVEDILOL 12.5 MG PO TABS
12.5000 mg | ORAL_TABLET | Freq: Two times a day (BID) | ORAL | Status: DC
  Administered 2014-11-24 – 2014-11-27 (×6): 12.5 mg via ORAL
  Filled 2014-11-24 (×8): qty 1

## 2014-11-24 MED ORDER — POTASSIUM CHLORIDE CRYS ER 20 MEQ PO TBCR
40.0000 meq | EXTENDED_RELEASE_TABLET | Freq: Every day | ORAL | Status: DC
  Filled 2014-11-24: qty 2

## 2014-11-24 MED ORDER — ASPIRIN EC 81 MG PO TBEC
81.0000 mg | DELAYED_RELEASE_TABLET | Freq: Every day | ORAL | Status: DC
  Administered 2014-11-25 – 2014-11-27 (×3): 81 mg via ORAL
  Filled 2014-11-24 (×4): qty 1

## 2014-11-24 MED ORDER — MORPHINE SULFATE 2 MG/ML IJ SOLN
2.0000 mg | INTRAMUSCULAR | Status: DC | PRN
  Administered 2014-11-24: 3 mg via INTRAVENOUS
  Administered 2014-11-24: 2 mg via INTRAVENOUS
  Filled 2014-11-24: qty 2

## 2014-11-24 MED ORDER — SODIUM CHLORIDE 0.9 % IV SOLN: 250.0000 mL | INTRAVENOUS | Status: DC | PRN

## 2014-11-24 NOTE — H&P (Signed)
[place], [address], [place] [address] Phone: [phone] Fax: [phone]  Date: 11/24/2014   Patient ID: Kyle Bullock, Kyle Bullock 1951-02-22, MRN [id number]  PCP: No primary care provider on file. Cardiologist: Burt Knack Electrophysiologist: Allred  History of Present Illness: Kyle Bullock is a 63 y.o. male with history of recent CAD/VF arrest s/p PCI, ICM EF 20%, HTN, tobacco abuse, dyslipidemia, RBBB recent readmit for NSVT, and a second readmit for SOB/CP (?pericarditis) who presents for post-hospital followup.   He and his wife were in [place] visiting on 11/10/14 when he developed chest pain. EMS was called. En route to [facility] he had a VF arrest. He was shocked x 1 to NSR. In the ER STEMI was called, EKG revealed RBBB with AL ST elevation. Urgent cath revealed total LAD at the takeoff of the Dx1 with 95% Dx1. He underwent Dx1 POBA and LAD DES with a Xience stent. His EF at cath and by echo was 20%. His post MI course was unremarkable. He was discharged 11/15/14 on ASA, Brilinta, Coreg, Lasix, Lipitor, Zerstril 2.5 mg. and a LifeVest. He lives here locally and had an appointment to establish care in our office. However, before that appointment he presented back to the ED with intermittent SOB and anxiety but no recurrent CP. In the ER here at [facility], he had 18 bt of NSVT (rate 135-140 bpm) - his Life Vest did not fire. Dyspnea was felt multifactorial from Brilinta and acute systolic CHF so he was also treated with IV Lasix. Troponins went from POC 7.35 but full set 2.26-2.44-2.31 - Dr. Burt Knack felt this was residual elevation from the recent infarct with an essentially flat trend. Dr. Rayann Heman with EP consulted on the case. He reviewed the lifevest which showed no arrhythmias > 150 bpm and no therapies delivered. He recommended to keep K>3.9 and Mg>1.9. (K level 4.2, 3.9 this adm). The patient was instructed not to drive for 6 months. Dr.  Rayann Heman reported "Though we could consider EP study and possible ICD if inducible ventricular arrhythmias are found, I think that a more conservative approach would be to continue medical therapy with lifevest in place and then reassess EF after 90 days post revascularization." The patient agreed with this plan. Lisinopril was changed to ARB for dry cough.  He was due to f/u today in clinic with me following that discharge but returned back to the hospital 12/13-12/16 with precordial chest pain eventually felt due to pericarditis. Relook cath showed a widely patent stent without evidence of a culprit lesion. Echo 11/20/14: septal and apical akinesis, LV cavity severely dilated, EF 20-25%, diffuse HK, mild MR, normal pericardial appearance. His Coreg was titrated down due to hypotension. He was otherwise discharged on prior home meds. He has a f/u appt in January with EP to discuss further plans for evaluation of ICD candidacy.  He felt well the day of discharge as well as yesterday, but today feels a little weaker. He continues to have dyspnea that comes and goes. He also has had continued chest soreness since his hospitalization similar to before his cardiac cath. This is not made worse by anything including exertion, palpation or inspiration. He is not tachycardic, tachypnic or hypoxic. He reports continued orthopnea ever since his initial hospitalization. He is not sleeping well - has insomnia and Ambien only helps so much. His wife is concerned he has sleep apnea. Weight is stable at home. Denies any bleeding.  Recent Labs:  11/17/2014: TSH 11.250* 11/19/2014: ALT 64* 11/20/2014: Pro B Natriuretic peptide (BNP) 3711.0* 11/21/2014: Creatinine 0.91; Potassium 4.5 11/22/2014: Hemoglobin 11.0*  Wt Readings from Last 3 Encounters:  11/24/14 193 lb 9.6 oz (87.816 kg)  11/22/14 191 lb 14.4 oz (87.045 kg)  11/17/14 189 lb 6.4 oz (85.911 kg)     Past Medical History  Diagnosis Date  .  Hypertension   . Ischemic cardiomyopathy     a. 11/2014: EF reportedly 20% by cath and echo in [place].  . VF (ventricular fibrillation) 11/10/14    a. 11/2014: arrest with STEMI.  Marland Kitchen Myocardial infarction 11/10/2014  . NSVT (nonsustained ventricular tachycardia) 11/16/14    a. 11/2014: admitted after discharge from STEMI admission, 18 beats 135-140bpm in ED.  Marland Kitchen CAD (coronary artery disease)     a. 11/2014: arrest/STEMI s/p Dx 1 POBA and DES to LAD [place], [place]). b. Relook cath 11/2014: no culprit, patent stent.  Marland Kitchen Dyspnea     a. 11/2014: possibly due to combo of CHF and Brilinta. Brilinta changed to Effient.  . Tobacco abuse   . RBBB   . Dyslipidemia   . Abnormal TSH     a. 11/2014: felt d/t sick euthyroid.  Marland Kitchen Pericarditis     Current Outpatient Prescriptions  Medication Sig Dispense Refill  . aspirin EC 81 MG tablet Take 81 mg by mouth daily.    Marland Kitchen atorvastatin (LIPITOR) 20 MG tablet Take 20 mg by mouth at bedtime.    . carvedilol (COREG) 12.5 MG tablet Take 1.5 tablets (18.75 mg total) by mouth 2 (two) times daily with a meal. 90 tablet 5  . furosemide (LASIX) 20 MG tablet Take 1 tablet (20 mg total) by mouth daily. 30 tablet 5  . losartan (COZAAR) 25 MG tablet Take 1 tablet (25 mg total) by mouth daily. 30 tablet 6  . nicotine (NICODERM CQ - DOSED IN MG/24 HOURS) 14 mg/24hr patch Place 1 patch (14 mg total) onto the skin daily. (Patient not taking: Reported on 11/19/2014) 28 patch 0  . nitroGLYCERIN (NITROSTAT) 0.4 MG SL tablet Place 1 tablet (0.4 mg total) under the tongue every 5 (five) minutes as needed for chest pain (up to 3 doses). 25 tablet 3  . potassium chloride SA (K-DUR,KLOR-CON) 20 MEQ tablet Take 40 mEq by mouth daily.    . prasugrel (EFFIENT) 10 MG TABS tablet Take 1 tablet (10 mg total) by mouth daily. 30 tablet 11  . tadalafil (CIALIS) 20 MG tablet Take 20 mg by  mouth as needed for erectile dysfunction.    Marland Kitchen zolpidem (AMBIEN) 10 MG tablet Take 1 tablet (10 mg total) by mouth at bedtime as needed for sleep. (Patient not taking: Reported on 11/24/2014) 12 tablet 0   No current facility-administered medications for this visit.    Allergies: Lisinopril and Brilinta   Social History: The patient  reports that he has quit smoking. His smoking use included Cigarettes. He has a 80 pack-year smoking history. He has never used smokeless tobacco. He reports that he drinks alcohol. He reports that he does not use illicit drugs.   Family History: The patient's family history includes Hypertension in an other family member. There is no history of CAD.  ROS: Please see the history of present illness. All other systems reviewed and negative.   PHYSICAL EXAM:  VS: BP 102/66 mmHg  Pulse 83  Resp 14  Ht _0  (1.905 m)  Wt 193 lb 9.6 oz (87.816 kg)  BMI 24.20 kg/m2  SpO2 97% - right hand 94%, left hand 96-97% Well nourished WM, in no acute distress - sallow complexion HEENT: normal  Neck: no JVD  Cardiac: normal S1, S2; RRR; no murmur, no rub Lungs: clear to auscultation bilaterally, no wheezing, rhonchi or rales  Abd: soft, nontender, no hepatomegaly  Ext: no edema, right radial cath site without hematoma or ecchymosis Skin: warm and dry  Neuro: moves all extremities spontaneously, no focal abnormalities noted  EKG: NSR 83bpm, possible LAE, incomplete RBBB, prior septal and lateral infarct, nonspecific ST-T changes, stable compared to prior tracings (most similar to earlier December tracings)  ASSESSMENT AND PLAN:  1. CAD with recent STEMI s/p DES to LAD, POBA Dx 1 complicated by possible recent acute pericarditis - continue ASA, BB, Effient, statin. He does have some residual chest discomfort similar to last hospitalization when cath showed patent stent and no culprit. EKG is stable - pain is not worse with recumbency and he does  not have a rub on exam. I wonder if this is r/t his VF arrest/CPR - I have asked him to monitor for further recurrence/increase in symptoms and report to ED if feeling worse. Will check ESR. Given that he is still c/o intermittent weakness will not increase statin at this time so as not to muddy the waters. If he is feeling better at f/u appointment, would consider titrating statin at that time. 2. Chronic systolic CHF/ICM with intermittent dyspnea/orthopnea - he does not appear volume overloaded on exam but recent pBNP was up. He was sent home on lower dose of oral Lasix at discharge due to soft BPs. Wife reports stable weight at home. Will recheck BMET, BNP, CBC today to further assess continued dyspnea. He also has a 30-40 yr history of tobacco so some of this may be COPD coming to light now that he is in surveillance-mode for further symptoms. I will consider discussing changing Coreg to Metoprolol with primary cardiologist based on lab outcome. If he requires additional diuresis I think we'll need to pull back on some of his other antihypertensives because his BP tends to run soft. His wife is also concerned for sleep disordered breathing and we will refer him for sleep apnea and also to assess overnight O2 needs. Given that benzos can worsen possible OSA, I have held off on changing Ambien to something like Xanax for now to avoid a false positive. 3. VF arrest, recent NSVT, on Lifevest - has f/u with EP already scheduled. 4. HTN with recent hypotension. 5. Tobacco abuse, currently abstinent. 6. ED - he was counseled by our pharmacist today not to use Cialis and NTG within 24 hrs of one another.  Dispo: F/u 1-2 weeks with Dr. Burt Knack or myself. F/u as planned with EP in January to discuss timing of f/u echo and ICD discussion.  Signed, Melina Copa, PA-C  11/24/2014 9:02 AM   Addendum: after getting labs drawn, the phlebotomist noted the patient looked somewhat gray. He also began to complain of  10/10 CP. BP stable at 102/70. I discussed the patient in depth with Dr. Marlou Porch who also assessed patient with me. EKG unchanged from previous. 1 SL NTG given with no relief of chest pain, continues 10/10. BP 88/52. He says the pain now has a component that increases with inspiration. He took all his AM meds this morning. Will transfer [facility] to [facility] and admit. Stepdown beds not available so given ongoing CP will admit to ICU. Since none of his bloodwork will  be back in time from the office will plan to check stat CBC, BMET, BNP, ESR, and cycle troponins upon arrival. If Cr is OK would obtain CTA to exclude PE since cath just a few days ago did not show any culprit lesions. Will decrease Coreg dose to 12.61m starting this evening and hold further doses of Losartan. Based on BP appropriateness, may consider adding Imdur. Hold off on heparin for now unless enzymes turn positive. I have signed out to [place] as BLyda Jester PA-C to pick up care in the hospital to closely f/u his labs and clinical status.  Naome Brigandi PA-C 11/24/2014 10:53 AM

## 2014-11-24 NOTE — Addendum Note (Signed)
Addended by: Laurann MontanaUNN, Kourosh Jablonsky N on: 11/24/2014 10:53 AM   Modules accepted: Level of Service

## 2014-11-24 NOTE — Progress Notes (Signed)
   I have personally reviewed labs including CBC and BMP. Hgb is improved at 12.5 and renal function is stable with SCr at 1.06. As requested by Ronie Spiesayna Dunn, PA-C, and Dr. Anne FuSkains, will order a CT of the chest to rule out PE and to also define his aortic anatomy.   SIMMONS, BRITTAINY 11/24/2014

## 2014-11-24 NOTE — Patient Instructions (Addendum)
Your physician has recommended you make the following change in your medication:   DO NOT TAKE NITROGLYCERIN WITH 24 HOURS OF CIALIS AND VIS VERSA     LABS TODAY BMET CBC BNP  SED RATE    REFER  YOU TO GET A SLEEP STUDY    Your physician recommends that you schedule a follow-up appointment in:  WITH DR Excell SeltzerOOPER OR DANA  IN  2 WEEKS

## 2014-11-24 NOTE — Progress Notes (Signed)
   CT is negative for PE. Patient still with pleuritic CP although improved some with morphine. Recent re-look LHC 11/19/14 revealed patient stents and acute pericarditis was felt to be the etiology. However, after reviewing records/MAR from last admission, it appears that he was never treated medically for pericarditis. His recent echo as well as CT scan today demonstrates moderate pericardial effusion. He is hemodynamically stable with BP in the upper 90s-low 100s. WBC is mildly elevated at 12.7 and ESR is markedly elevated at 80. Due to recently place DES and DAPT with ASA + Effient, he is not a candidate for NSAID therapy. Will give trial of colchiceine as monotherapy. MD to reassess in the am.   Lyda Jester 11/24/2014

## 2014-11-24 NOTE — Progress Notes (Signed)
Pharmacy Transitions of Care Post-ACS Visit  Admit Complaint: Kyle Bullock is a 63 yo male discharged on 11/22/14 with STEMI and DES. PMH is significant for HFrEF, HTN, HLD, RBBB, CAD s/p iCMP with life vest. Patient presents to clinic today with his wife for pharmacy medication reconciliation.  Medications and allergies were reviewed with patient. Patient did not bring his medications with him to today's visit. Overall, patient does not feel well this morning. He has not slept well since he left the hospital and has had trouble breathing. He states that the Ambien is not working well for him and would like to try something else. His wife thinks he has sleep apnea. Pt states that he received O2 in the hospital via nasal canula which helped him to breathe better. Pt will likely need referral for sleep study. Pt smoked 2 PPD and has an 80 pack year history, although he has not smoked a cigarette since he went to the hospital over a week ago. He is motivated to quit and states that he does not want to use any medication now to help since he is still not feeling well. He thinks that once he feels back to normal and starts with his daily routine, his cravings may come back. At that point, he is willing to use the patch. A prescription has already been sent to the pharmacy.  Current Outpatient Prescriptions  Medication Sig Dispense Refill  . aspirin EC 81 MG tablet Take 81 mg by mouth daily.    Marland Kitchen. atorvastatin (LIPITOR) 20 MG tablet Take 20 mg by mouth at bedtime.    . carvedilol (COREG) 12.5 MG tablet Take 1.5 tablets (18.75 mg total) by mouth 2 (two) times daily with a meal. 90 tablet 5  . furosemide (LASIX) 20 MG tablet Take 1 tablet (20 mg total) by mouth daily. 30 tablet 5  . losartan (COZAAR) 25 MG tablet Take 1 tablet (25 mg total) by mouth daily. 30 tablet 6  . nitroGLYCERIN (NITROSTAT) 0.4 MG SL tablet Place 1 tablet (0.4 mg total) under the tongue every 5 (five) minutes as needed for chest pain (up to  3 doses). 25 tablet 3  . potassium chloride SA (K-DUR,KLOR-CON) 20 MEQ tablet Take 40 mEq by mouth daily.    . prasugrel (EFFIENT) 10 MG TABS tablet Take 1 tablet (10 mg total) by mouth daily. 30 tablet 11  . tadalafil (CIALIS) 20 MG tablet Take 20 mg by mouth as needed for erectile dysfunction.    . nicotine (NICODERM CQ - DOSED IN MG/24 HOURS) 14 mg/24hr patch Place 1 patch (14 mg total) onto the skin daily. (Patient not taking: Reported on 11/19/2014) 28 patch 0  . zolpidem (AMBIEN) 10 MG tablet Take 1 tablet (10 mg total) by mouth at bedtime as needed for sleep. (Patient not taking: Reported on 11/24/2014) 12 tablet 0   No current facility-administered medications for this visit.    ACS Medication Checklist:     Medication      YES      NO N/A or C/I and Explanation  Beta Blocker [x]  []    ACEi or ARB [x]  []  Pt on losartan d/t dry cough that developed with lisinopril.   High-dose statin    (atorvastatin 40 or 80mg , rosuvastatin 20 or 40mg ) []  [x]  Pt discharged on only 20mg  of atorvastatin  Clopidogrel +/- other         anti-platelets [x]         []   Effient  Aspirin [x]  []   Nitroglycerin [x]  []     Smoking cessation counseling provided [x]  []    Cardiac rehab referral [x]         []      Assessment/Plan: refer to chart below.    Interventions during today's visit include: Intervention     YES      NO  Explanation   Indications      Needs Therapy []  [x]     Unnecessary Therapy []  [x]    Efficacy     Suboptimal Drug Selection []  [x]    Insufficient Dose/Duration [x]  []  Pt was not discharged on a high dose statin, also has history of HLD.    Safety      Adverse Drug Event []  [x]     Drug Interaction [x]  []  Pt occasionally takes Cialis and was prescribed NTG after his STEMI. Not great alternatives to either of these agents. Discussed interaction thoroughly with patient and his wife--advised that he should not take NTG within 24 hours of using Cialis.   Excessive Dose/Duration []  [x]     Compliance     Underuse []  [x]     Overuse []  [x]     Changes today include: discussed interaction between Cialis and nitroglycerin with patient and his wife, emphasizing that he cannot take nitroglycerin within 24 hours of Cialis. Rx for nicotine patch is waiting at pharmacy if and when pt feels he needs it. At this point will hold off on increasing dose of atorvastatin since pt not feeling well; however, he is indicated for high dose statin and dose should be increased when pt feeling better. Will discuss this when calling pt in a month for pharmacy f/u.

## 2014-11-25 ENCOUNTER — Encounter (HOSPITAL_COMMUNITY): Payer: Self-pay | Admitting: *Deleted

## 2014-11-25 DIAGNOSIS — I241 Dressler's syndrome: Secondary | ICD-10-CM | POA: Diagnosis present

## 2014-11-25 DIAGNOSIS — R072 Precordial pain: Secondary | ICD-10-CM

## 2014-11-25 DIAGNOSIS — I255 Ischemic cardiomyopathy: Secondary | ICD-10-CM

## 2014-11-25 DIAGNOSIS — R0600 Dyspnea, unspecified: Secondary | ICD-10-CM

## 2014-11-25 LAB — CBC
HEMATOCRIT: 34.3 % — AB (ref 39.0–52.0)
Hemoglobin: 11.5 g/dL — ABNORMAL LOW (ref 13.0–17.0)
MCH: 30.8 pg (ref 26.0–34.0)
MCHC: 33.5 g/dL (ref 30.0–36.0)
MCV: 92 fL (ref 78.0–100.0)
PLATELETS: 374 10*3/uL (ref 150–400)
RBC: 3.73 MIL/uL — ABNORMAL LOW (ref 4.22–5.81)
RDW: 13.3 % (ref 11.5–15.5)
WBC: 12.3 10*3/uL — ABNORMAL HIGH (ref 4.0–10.5)

## 2014-11-25 LAB — BASIC METABOLIC PANEL
Anion gap: 18 — ABNORMAL HIGH (ref 5–15)
BUN: 20 mg/dL (ref 6–23)
CALCIUM: 9.2 mg/dL (ref 8.4–10.5)
CO2: 20 meq/L (ref 19–32)
CREATININE: 0.85 mg/dL (ref 0.50–1.35)
Chloride: 95 mEq/L — ABNORMAL LOW (ref 96–112)
GFR calc Af Amer: >90 mL/min (ref >90)
Glucose, Bld: 107 mg/dL — ABNORMAL HIGH (ref 70–99)
Potassium: 5.1 mEq/L (ref 3.7–5.3)
SODIUM: 133 meq/L — AB (ref 137–147)

## 2014-11-25 LAB — TROPONIN I

## 2014-11-25 MED ORDER — PREDNISONE 20 MG PO TABS
40.0000 mg | ORAL_TABLET | Freq: Every day | ORAL | Status: DC
  Administered 2014-11-25 – 2014-11-27 (×3): 40 mg via ORAL
  Filled 2014-11-25 (×5): qty 2

## 2014-11-25 MED ORDER — POTASSIUM CHLORIDE CRYS ER 20 MEQ PO TBCR
40.0000 meq | EXTENDED_RELEASE_TABLET | Freq: Every day | ORAL | Status: DC
  Administered 2014-11-26 – 2014-11-27 (×2): 40 meq via ORAL
  Filled 2014-11-25 (×2): qty 2

## 2014-11-25 MED ORDER — GUAIFENESIN-DM 100-10 MG/5ML PO SYRP
5.0000 mL | ORAL_SOLUTION | ORAL | Status: DC | PRN
  Administered 2014-11-25: 5 mL via ORAL
  Filled 2014-11-25: qty 5

## 2014-11-25 NOTE — Progress Notes (Signed)
UR completed 

## 2014-11-25 NOTE — Progress Notes (Signed)
DAILY PROGRESS NOTE  Subjective:  Feels somewhat better today. On colchicine. CT yesterday shows no PE. He does have a moderate-sized pericardial effusion. ESR is elevated at 80 and there is leukocytosis - this is likely Dressler's syndrome.  Objective:  Temp:  [97.7 F (36.5 C)-98.3 F (36.8 C)] 98.3 F (36.8 C) (12/19 0800) Pulse Rate:  [52-91] 86 (12/19 0815) Resp:  [15-39] 28 (12/19 0815) BP: (82-120)/(45-86) 108/79 mmHg (12/19 0800) SpO2:  [91 %-100 %] 93 % (12/19 0815) Weight:  [188 lb 0.8 oz (85.3 kg)-190 lb 11.2 oz (86.5 kg)] 190 lb 11.2 oz (86.5 kg) (12/19 0500) Weight change:   Intake/Output from previous day: 12/18 0701 - 12/19 0700 In: -  Out: 0623 [Urine:1125]  Intake/Output from this shift:    Medications: Current Facility-Administered Medications  Medication Dose Route Frequency Provider Last Rate Last Dose  . 0.9 %  sodium chloride infusion  250 mL Intravenous PRN Dayna N Dunn, PA-C      . acetaminophen (TYLENOL) tablet 650 mg  650 mg Oral Q4H PRN Dayna N Dunn, PA-C      . antiseptic oral rinse (CPC / CETYLPYRIDINIUM CHLORIDE 0.05%) solution 7 mL  7 mL Mouth Rinse BID Candee Furbish, MD   7 mL at 11/24/14 2130  . aspirin EC tablet 81 mg  81 mg Oral Daily Dayna N Dunn, PA-C      . atorvastatin (LIPITOR) tablet 20 mg  20 mg Oral QHS Dayna N Dunn, PA-C   20 mg at 11/24/14 2129  . carvedilol (COREG) tablet 12.5 mg  12.5 mg Oral BID WC Dayna N Dunn, PA-C   12.5 mg at 11/25/14 0744  . colchicine tablet 0.6 mg  0.6 mg Oral BID Consuelo Pandy, PA-C   0.6 mg at 11/24/14 2129  . furosemide (LASIX) tablet 20 mg  20 mg Oral Daily Dayna N Dunn, PA-C      . guaiFENesin-dextromethorphan (ROBITUSSIN DM) 100-10 MG/5ML syrup 5 mL  5 mL Oral Q4H PRN Candee Furbish, MD   5 mL at 11/25/14 0512  . heparin injection 5,000 Units  5,000 Units Subcutaneous 3 times per day Charlie Pitter, PA-C   5,000 Units at 11/25/14 7628  . morphine 2 MG/ML injection 2-3 mg  2-3 mg Intravenous Q2H  PRN Troy Sine, MD   3 mg at 11/24/14 1505  . nitroGLYCERIN (NITROSTAT) SL tablet 0.4 mg  0.4 mg Sublingual Q5 min PRN Dayna N Dunn, PA-C      . ondansetron (ZOFRAN) injection 4 mg  4 mg Intravenous Q6H PRN Dayna N Dunn, PA-C      . potassium chloride SA (K-DUR,KLOR-CON) CR tablet 40 mEq  40 mEq Oral Daily Dayna N Dunn, PA-C      . prasugrel (EFFIENT) tablet 10 mg  10 mg Oral Daily Dayna N Dunn, PA-C      . sodium chloride 0.9 % injection 3 mL  3 mL Intravenous Q12H Dayna N Dunn, PA-C   3 mL at 11/24/14 2130  . sodium chloride 0.9 % injection 3 mL  3 mL Intravenous PRN Dayna N Dunn, PA-C      . zolpidem (AMBIEN) tablet 10 mg  10 mg Oral QHS PRN Charlie Pitter, PA-C        Physical Exam: General appearance: alert and no distress Lungs: clear to auscultation bilaterally Heart: regular rate and rhythm, S1, S2 normal, no murmur, click, rub or gallop Extremities: extremities normal, atraumatic, no cyanosis or edema Pulses:  2+ and symmetric  Lab Results: Results for orders placed or performed during the hospital encounter of 11/24/14 (from the past 48 hour(s))  Basic metabolic panel     Status: Abnormal   Collection Time: 11/24/14 11:36 AM  Result Value Ref Range   Sodium 135 (L) 137 - 147 mEq/L   Potassium 5.4 (H) 3.7 - 5.3 mEq/L   Chloride 97 96 - 112 mEq/L   CO2 25 19 - 32 mEq/L   Glucose, Bld 132 (H) 70 - 99 mg/dL   BUN 20 6 - 23 mg/dL   Creatinine, Ser 1.06 0.50 - 1.35 mg/dL   Calcium 9.3 8.4 - 10.5 mg/dL   GFR calc non Af Amer 72 (L) >90 mL/min   GFR calc Af Amer 84 (L) >90 mL/min    Comment: (NOTE) The eGFR has been calculated using the CKD EPI equation. This calculation has not been validated in all clinical situations. eGFR's persistently <90 mL/min signify possible Chronic Kidney Disease.    Anion gap 13 5 - 15  T4, free     Status: None   Collection Time: 11/24/14 11:36 AM  Result Value Ref Range   Free T4 1.21 0.80 - 1.80 ng/dL    Comment: Performed at [facility]  Troponin I-(serum)     Status: None   Collection Time: 11/24/14 11:36 AM  Result Value Ref Range   Troponin I <0.30 <0.30 ng/mL    Comment:        Due to the release kinetics of cTnI, a negative result within the first hours of the onset of symptoms does not rule out myocardial infarction with certainty. If myocardial infarction is still suspected, repeat the test at appropriate intervals.   Pro b natriuretic peptide (BNP)     Status: Abnormal   Collection Time: 11/24/14 11:36 AM  Result Value Ref Range   Pro B Natriuretic peptide (BNP) 6414.0 (H) 0 - 125 pg/mL  CBC WITH DIFFERENTIAL     Status: Abnormal   Collection Time: 11/24/14 11:36 AM  Result Value Ref Range   WBC 12.7 (H) 4.0 - 10.5 K/uL   RBC 4.04 (L) 4.22 - 5.81 MIL/uL   Hemoglobin 12.5 (L) 13.0 - 17.0 g/dL   HCT 36.9 (L) 39.0 - 52.0 %   MCV 91.3 78.0 - 100.0 fL   MCH 30.9 26.0 - 34.0 pg   MCHC 33.9 30.0 - 36.0 g/dL   RDW 12.9 11.5 - 15.5 %   Platelets 439 (H) 150 - 400 K/uL   Neutrophils Relative % 80 (H) 43 - 77 %   Neutro Abs 10.1 (H) 1.7 - 7.7 K/uL   Lymphocytes Relative 6 (L) 12 - 46 %   Lymphs Abs 0.7 0.7 - 4.0 K/uL   Monocytes Relative 14 (H) 3 - 12 %   Monocytes Absolute 1.8 (H) 0.1 - 1.0 K/uL   Eosinophils Relative 0 0 - 5 %   Eosinophils Absolute 0.0 0.0 - 0.7 K/uL   Basophils Relative 0 0 - 1 %   Basophils Absolute 0.0 0.0 - 0.1 K/uL  Protime-INR     Status: None   Collection Time: 11/24/14 11:36 AM  Result Value Ref Range   Prothrombin Time 15.1 11.6 - 15.2 seconds   INR 1.17 0.00 - 1.49  Magnesium     Status: None   Collection Time: 11/24/14 11:36 AM  Result Value Ref Range   Magnesium 2.4 1.5 - 2.5 mg/dL  Sedimentation rate  Status: Abnormal   Collection Time: 11/24/14 11:36 AM  Result Value Ref Range   Sed Rate 80 (H) 0 - 16 mm/hr  TSH     Status: Abnormal   Collection Time: 11/24/14 12:00 PM  Result Value Ref Range   TSH 9.700 (H) 0.350 - 4.500 uIU/mL  Troponin I-(serum)      Status: None   Collection Time: 11/24/14  4:40 PM  Result Value Ref Range   Troponin I <0.30 <0.30 ng/mL    Comment:        Due to the release kinetics of cTnI, a negative result within the first hours of the onset of symptoms does not rule out myocardial infarction with certainty. If myocardial infarction is still suspected, repeat the test at appropriate intervals.   Troponin I-(serum)     Status: None   Collection Time: 11/24/14 11:00 PM  Result Value Ref Range   Troponin I <0.30 <0.30 ng/mL    Comment:        Due to the release kinetics of cTnI, a negative result within the first hours of the onset of symptoms does not rule out myocardial infarction with certainty. If myocardial infarction is still suspected, repeat the test at appropriate intervals.   Basic metabolic panel     Status: Abnormal   Collection Time: 11/25/14  2:26 AM  Result Value Ref Range   Sodium 133 (L) 137 - 147 mEq/L   Potassium 5.1 3.7 - 5.3 mEq/L   Chloride 95 (L) 96 - 112 mEq/L   CO2 20 19 - 32 mEq/L   Glucose, Bld 107 (H) 70 - 99 mg/dL   BUN 20 6 - 23 mg/dL   Creatinine, Ser 0.85 0.50 - 1.35 mg/dL   Calcium 9.2 8.4 - 10.5 mg/dL   GFR calc non Af Amer >90 >90 mL/min   GFR calc Af Amer >90 >90 mL/min    Comment: (NOTE) The eGFR has been calculated using the CKD EPI equation. This calculation has not been validated in all clinical situations. eGFR's persistently <90 mL/min signify possible Chronic Kidney Disease.    Anion gap 18 (H) 5 - 15  CBC     Status: Abnormal   Collection Time: 11/25/14  2:26 AM  Result Value Ref Range   WBC 12.3 (H) 4.0 - 10.5 K/uL   RBC 3.73 (L) 4.22 - 5.81 MIL/uL   Hemoglobin 11.5 (L) 13.0 - 17.0 g/dL   HCT 34.3 (L) 39.0 - 52.0 %   MCV 92.0 78.0 - 100.0 fL   MCH 30.8 26.0 - 34.0 pg   MCHC 33.5 30.0 - 36.0 g/dL   RDW 13.3 11.5 - 15.5 %   Platelets 374 150 - 400 K/uL    Imaging: Ct Angio Chest Pe W/cm &/or Wo Cm  11/24/2014   CLINICAL DATA:  Chest pain and  difficulty breathing  EXAM: CT ANGIOGRAPHY CHEST WITH CONTRAST  TECHNIQUE: Multidetector CT imaging of the chest was performed using the standard protocol during bolus administration of intravenous contrast. Multiplanar CT image reconstructions and MIPs were obtained to evaluate the vascular anatomy.  CONTRAST:  120m OMNIPAQUE IOHEXOL 350 MG/ML SOLN  COMPARISON:  Chest radiograph November 22, 2014  FINDINGS: There is no demonstrable pulmonary embolus. There is no appreciable thoracic aortic aneurysm.  There is a moderate pericardial effusion. Heart is enlarged with left ventricular hypertrophy. There is relative narrowing of the right ventricle.  There are bilateral pleural effusions with bibasilar consolidation, slightly larger on the left than on  the right. There is evidence of diffuse interstitial fibrosis. There may be some superimposed interstitial edema. There is patchy honeycombing in both upper lobes, more on the left than on the right. These areas may represent localized interstitial edema in the presence of interstitial fibrosis. These areas also may represent some localized atelectatic change.  There are multiple small lymph nodes but no frank adenopathy.  In the visualized upper abdomen, there is atherosclerotic change in the aorta. There are no blastic or lytic bone lesions. Thyroid appears within normal limits.  Review of the MIP images confirms the above findings.  IMPRESSION: No demonstrable pulmonary embolus.  Findings felt to represent congestive heart failure superimposed on interstitial fibrosis.  Moderate pericardial effusion. Note that there is narrowing of the right ventricular lumen. Etiology and significance of this finding uncertain. This finding may warrant echocardiography to further evaluate if echocardiography has not been performed recently.  Multiple small lymph nodes but no frank adenopathy.   Electronically Signed   By: Lowella Grip M.D.   On: 11/24/2014 14:52     Assessment:  Principal Problem:   Dressler's syndrome Active Problems:   HTN (hypertension)   CAD S/P urgent LAD DES, POBA to Dx1 11/10/14 [facility])   Ischemic cardiomyopathy- 20%-Life Vest   Dyspnea   VF arrest- shocked x 1 66/29/47   Chronic systolic CHF (congestive heart failure)   Hypotension   Precordial pain   Chest pain   Plan:  1. Agree with colchicine 0.6 mg BID - should be on this for at least 3 months. Will add prednisone 0.5 mg/kg/day (~40 mg/d) for 2 weeks then slow taper over then following 2 weeks (20 mg/d for 1 week, then 10 mg/d for 1 week). Re-check echo today to evaluate size of effusion and r/o tamponade (clinically not suspected). Hold K+ today, he is somewhat high. Continue diuretics. Monitor in CCU today, but may be able to move to stepdown tomorrow.  Time Spent Directly with Patient:  30 minutes   Length of Stay:  LOS: 1 day   Pixie Casino, MD, [facility] Attending Cardiologist [facility]  HILTY,Kenneth C 11/25/2014, 8:45 AM

## 2014-11-25 NOTE — Plan of Care (Signed)
Problem: Phase III Progression Outcomes Goal: No anginal pain Outcome: Progressing Pt. Intermittently complains of 1/10 pericardial pain, not anginal pain.

## 2014-11-25 NOTE — Progress Notes (Signed)
Pharmacist Heart Failure Core Measure Documentation  Assessment: Kyle Bullock has an EF documented as 20-25% on 11/20/14 by ECHO.  Rationale: Heart failure patients with left ventricular systolic dysfunction (LVSD) and an EF < 40% should be prescribed an angiotensin converting enzyme inhibitor (ACEI) or angiotensin receptor blocker (ARB) at discharge unless a contraindication is documented in the medical record.  This patient is not currently on an ACEI or ARB for HF.  This note is being placed in the record in order to provide documentation that a contraindication to the use of these agents is present for this encounter.  ACE Inhibitor or Angiotensin Receptor Blocker is contraindicated (specify all that apply)  []   ACEI allergy AND ARB allergy []   Angioedema []   Moderate or severe aortic stenosis [x]   Hyperkalemia [x]   Hypotension []   Renal artery stenosis []   Worsening renal function, preexisting renal disease or dysfunction   Elvin Soicolsen, Miklos Bidinger K 11/25/2014 3:35 PM

## 2014-11-25 NOTE — Progress Notes (Signed)
  Echocardiogram 2D Echocardiogram has been performed.  Kyle Bullock, Kyle Bullock 11/25/2014, 11:07 AM

## 2014-11-26 DIAGNOSIS — Z8674 Personal history of sudden cardiac arrest: Secondary | ICD-10-CM | POA: Diagnosis not present

## 2014-11-26 DIAGNOSIS — Z888 Allergy status to other drugs, medicaments and biological substances status: Secondary | ICD-10-CM | POA: Diagnosis not present

## 2014-11-26 DIAGNOSIS — G4700 Insomnia, unspecified: Secondary | ICD-10-CM | POA: Diagnosis present

## 2014-11-26 DIAGNOSIS — Z955 Presence of coronary angioplasty implant and graft: Secondary | ICD-10-CM | POA: Diagnosis not present

## 2014-11-26 DIAGNOSIS — I2510 Atherosclerotic heart disease of native coronary artery without angina pectoris: Secondary | ICD-10-CM | POA: Diagnosis present

## 2014-11-26 DIAGNOSIS — I4510 Unspecified right bundle-branch block: Secondary | ICD-10-CM | POA: Diagnosis present

## 2014-11-26 DIAGNOSIS — I313 Pericardial effusion (noninflammatory): Secondary | ICD-10-CM | POA: Diagnosis present

## 2014-11-26 DIAGNOSIS — I5022 Chronic systolic (congestive) heart failure: Secondary | ICD-10-CM | POA: Diagnosis present

## 2014-11-26 DIAGNOSIS — Z87891 Personal history of nicotine dependence: Secondary | ICD-10-CM | POA: Diagnosis not present

## 2014-11-26 DIAGNOSIS — I10 Essential (primary) hypertension: Secondary | ICD-10-CM | POA: Diagnosis present

## 2014-11-26 DIAGNOSIS — I241 Dressler's syndrome: Secondary | ICD-10-CM | POA: Diagnosis present

## 2014-11-26 DIAGNOSIS — Z7902 Long term (current) use of antithrombotics/antiplatelets: Secondary | ICD-10-CM | POA: Diagnosis not present

## 2014-11-26 DIAGNOSIS — R079 Chest pain, unspecified: Secondary | ICD-10-CM | POA: Diagnosis present

## 2014-11-26 DIAGNOSIS — Z7982 Long term (current) use of aspirin: Secondary | ICD-10-CM | POA: Diagnosis not present

## 2014-11-26 DIAGNOSIS — I959 Hypotension, unspecified: Secondary | ICD-10-CM | POA: Diagnosis present

## 2014-11-26 DIAGNOSIS — I3139 Other pericardial effusion (noninflammatory): Secondary | ICD-10-CM | POA: Diagnosis present

## 2014-11-26 DIAGNOSIS — I255 Ischemic cardiomyopathy: Secondary | ICD-10-CM | POA: Diagnosis present

## 2014-11-26 DIAGNOSIS — Z8249 Family history of ischemic heart disease and other diseases of the circulatory system: Secondary | ICD-10-CM | POA: Diagnosis not present

## 2014-11-26 DIAGNOSIS — E785 Hyperlipidemia, unspecified: Secondary | ICD-10-CM | POA: Diagnosis present

## 2014-11-26 DIAGNOSIS — I2109 ST elevation (STEMI) myocardial infarction involving other coronary artery of anterior wall: Secondary | ICD-10-CM | POA: Diagnosis present

## 2014-11-26 LAB — CBC
HCT: 32.8 % — ABNORMAL LOW (ref 39.0–52.0)
Hemoglobin: 11 g/dL — ABNORMAL LOW (ref 13.0–17.0)
MCH: 31.3 pg (ref 26.0–34.0)
MCHC: 33.5 g/dL (ref 30.0–36.0)
MCV: 93.2 fL (ref 78.0–100.0)
PLATELETS: 374 10*3/uL (ref 150–400)
RBC: 3.52 MIL/uL — ABNORMAL LOW (ref 4.22–5.81)
RDW: 13 % (ref 11.5–15.5)
WBC: 10.8 10*3/uL — ABNORMAL HIGH (ref 4.0–10.5)

## 2014-11-26 LAB — BASIC METABOLIC PANEL
ANION GAP: 12 (ref 5–15)
BUN: 18 mg/dL (ref 6–23)
CO2: 27 mEq/L (ref 19–32)
CREATININE: 0.83 mg/dL (ref 0.50–1.35)
Calcium: 8.9 mg/dL (ref 8.4–10.5)
Chloride: 96 mEq/L (ref 96–112)
GFR calc non Af Amer: >90 mL/min (ref >90)
Glucose, Bld: 107 mg/dL — ABNORMAL HIGH (ref 70–99)
Potassium: 4.2 mEq/L (ref 3.7–5.3)
Sodium: 135 mEq/L — ABNORMAL LOW (ref 137–147)

## 2014-11-26 LAB — SEDIMENTATION RATE: Sed Rate: 114 mm/hr — ABNORMAL HIGH (ref 0–16)

## 2014-11-26 LAB — PRO B NATRIURETIC PEPTIDE: Pro B Natriuretic peptide (BNP): 6302 pg/mL — ABNORMAL HIGH (ref 0–125)

## 2014-11-26 MED ORDER — ALPRAZOLAM 0.25 MG PO TABS
0.2500 mg | ORAL_TABLET | Freq: Every evening | ORAL | Status: DC | PRN
  Administered 2014-11-26: 0.25 mg via ORAL
  Filled 2014-11-26: qty 1

## 2014-11-26 MED ORDER — FUROSEMIDE 10 MG/ML IJ SOLN
40.0000 mg | Freq: Two times a day (BID) | INTRAMUSCULAR | Status: AC
  Administered 2014-11-26 (×2): 40 mg via INTRAVENOUS
  Filled 2014-11-26 (×2): qty 4

## 2014-11-26 MED ORDER — FUROSEMIDE 40 MG PO TABS: 40.0000 mg | ORAL_TABLET | Freq: Every day | ORAL | Status: DC

## 2014-11-26 NOTE — Progress Notes (Signed)
DAILY PROGRESS NOTE  Subjective:  Slept well last night - urine output is decreased. Chest pain has resolved. ESR is elevated today to 114, from 80. Echo shows small pericardial effusion - appears volume overloaded.  Objective:  Temp:  [97.6 F (36.4 C)-98.7 F (37.1 C)] 98.3 F (36.8 C) (12/20 0700) Pulse Rate:  [72-94] 78 (12/20 0800) Resp:  [9-37] 23 (12/20 0800) BP: (89-125)/(51-80) 109/71 mmHg (12/20 0800) SpO2:  [88 %-100 %] 95 % (12/20 0800) Weight:  [188 lb 4.4 oz (85.4 kg)] 188 lb 4.4 oz (85.4 kg) (12/20 0400) Weight change: 3.5 oz (0.1 kg)  Intake/Output from previous day: 12/19 0701 - 12/20 0700 In: -  Out: 900 [Urine:900]  Intake/Output from this shift:    Medications: Current Facility-Administered Medications  Medication Dose Route Frequency Provider Last Rate Last Dose  . 0.9 %  sodium chloride infusion  250 mL Intravenous PRN Dayna N Dunn, PA-C      . acetaminophen (TYLENOL) tablet 650 mg  650 mg Oral Q4H PRN Dayna N Dunn, PA-C      . antiseptic oral rinse (CPC / CETYLPYRIDINIUM CHLORIDE 0.05%) solution 7 mL  7 mL Mouth Rinse BID Candee Furbish, MD   7 mL at 11/25/14 2146  . aspirin EC tablet 81 mg  81 mg Oral Daily Dayna N Dunn, PA-C   81 mg at 11/25/14 0957  . atorvastatin (LIPITOR) tablet 20 mg  20 mg Oral QHS Dayna N Dunn, PA-C   20 mg at 11/25/14 2143  . carvedilol (COREG) tablet 12.5 mg  12.5 mg Oral BID WC Dayna N Dunn, PA-C   12.5 mg at 11/25/14 1746  . colchicine tablet 0.6 mg  0.6 mg Oral BID Consuelo Pandy, PA-C   0.6 mg at 11/25/14 2143  . furosemide (LASIX) tablet 20 mg  20 mg Oral Daily Dayna N Dunn, PA-C   20 mg at 11/25/14 0957  . guaiFENesin-dextromethorphan (ROBITUSSIN DM) 100-10 MG/5ML syrup 5 mL  5 mL Oral Q4H PRN Candee Furbish, MD   5 mL at 11/25/14 0512  . heparin injection 5,000 Units  5,000 Units Subcutaneous 3 times per day Charlie Pitter, PA-C   5,000 Units at 11/26/14 [phone]  . morphine 2 MG/ML injection 2-3 mg  2-3 mg Intravenous Q2H  PRN Troy Sine, MD   3 mg at 11/24/14 1505  . nitroGLYCERIN (NITROSTAT) SL tablet 0.4 mg  0.4 mg Sublingual Q5 min PRN Dayna N Dunn, PA-C      . ondansetron (ZOFRAN) injection 4 mg  4 mg Intravenous Q6H PRN Dayna N Dunn, PA-C      . potassium chloride SA (K-DUR,KLOR-CON) CR tablet 40 mEq  40 mEq Oral Daily Pixie Casino, MD      . prasugrel (EFFIENT) tablet 10 mg  10 mg Oral Daily Dayna N Dunn, PA-C   10 mg at 11/25/14 0957  . predniSONE (DELTASONE) tablet 40 mg  40 mg Oral Q breakfast Pixie Casino, MD   40 mg at 11/25/14 0957  . sodium chloride 0.9 % injection 3 mL  3 mL Intravenous Q12H Dayna N Dunn, PA-C   3 mL at 11/25/14 2143  . sodium chloride 0.9 % injection 3 mL  3 mL Intravenous PRN Dayna N Dunn, PA-C      . zolpidem (AMBIEN) tablet 10 mg  10 mg Oral QHS PRN Charlie Pitter, PA-C        Physical Exam: General appearance: alert and no distress  Lungs: clear to auscultation bilaterally Heart: regular rate and rhythm, S1, S2 normal, no murmur, click, rub or gallop Extremities: extremities normal, atraumatic, no cyanosis or edema Pulses: 2+ and symmetric  Lab Results: Results for orders placed or performed during the hospital encounter of 11/24/14 (from the past 48 hour(s))  Basic metabolic panel     Status: Abnormal   Collection Time: 11/24/14 11:36 AM  Result Value Ref Range   Sodium 135 (L) 137 - 147 mEq/L   Potassium 5.4 (H) 3.7 - 5.3 mEq/L   Chloride 97 96 - 112 mEq/L   CO2 25 19 - 32 mEq/L   Glucose, Bld 132 (H) 70 - 99 mg/dL   BUN 20 6 - 23 mg/dL   Creatinine, Ser 1.06 0.50 - 1.35 mg/dL   Calcium 9.3 8.4 - 10.5 mg/dL   GFR calc non Af Amer 72 (L) >90 mL/min   GFR calc Af Amer 84 (L) >90 mL/min    Comment: (NOTE) The eGFR has been calculated using the CKD EPI equation. This calculation has not been validated in all clinical situations. eGFR's persistently <90 mL/min signify possible Chronic Kidney Disease.    Anion gap 13 5 - 15  T4, free     Status: None    Collection Time: 11/24/14 11:36 AM  Result Value Ref Range   Free T4 1.21 0.80 - 1.80 ng/dL    Comment: Performed at [facility]  Troponin I-(serum)     Status: None   Collection Time: 11/24/14 11:36 AM  Result Value Ref Range   Troponin I <0.30 <0.30 ng/mL    Comment:        Due to the release kinetics of cTnI, a negative result within the first hours of the onset of symptoms does not rule out myocardial infarction with certainty. If myocardial infarction is still suspected, repeat the test at appropriate intervals.   Pro b natriuretic peptide (BNP)     Status: Abnormal   Collection Time: 11/24/14 11:36 AM  Result Value Ref Range   Pro B Natriuretic peptide (BNP) 6414.0 (H) 0 - 125 pg/mL  CBC WITH DIFFERENTIAL     Status: Abnormal   Collection Time: 11/24/14 11:36 AM  Result Value Ref Range   WBC 12.7 (H) 4.0 - 10.5 K/uL   RBC 4.04 (L) 4.22 - 5.81 MIL/uL   Hemoglobin 12.5 (L) 13.0 - 17.0 g/dL   HCT 36.9 (L) 39.0 - 52.0 %   MCV 91.3 78.0 - 100.0 fL   MCH 30.9 26.0 - 34.0 pg   MCHC 33.9 30.0 - 36.0 g/dL   RDW 12.9 11.5 - 15.5 %   Platelets 439 (H) 150 - 400 K/uL   Neutrophils Relative % 80 (H) 43 - 77 %   Neutro Abs 10.1 (H) 1.7 - 7.7 K/uL   Lymphocytes Relative 6 (L) 12 - 46 %   Lymphs Abs 0.7 0.7 - 4.0 K/uL   Monocytes Relative 14 (H) 3 - 12 %   Monocytes Absolute 1.8 (H) 0.1 - 1.0 K/uL   Eosinophils Relative 0 0 - 5 %   Eosinophils Absolute 0.0 0.0 - 0.7 K/uL   Basophils Relative 0 0 - 1 %   Basophils Absolute 0.0 0.0 - 0.1 K/uL  Protime-INR     Status: None   Collection Time: 11/24/14 11:36 AM  Result Value Ref Range   Prothrombin Time 15.1 11.6 - 15.2 seconds   INR 1.17 0.00 - 1.49  Magnesium     Status:  None   Collection Time: 11/24/14 11:36 AM  Result Value Ref Range   Magnesium 2.4 1.5 - 2.5 mg/dL  Sedimentation rate     Status: Abnormal   Collection Time: 11/24/14 11:36 AM  Result Value Ref Range   Sed Rate 80 (H) 0 - 16 mm/hr  TSH     Status:  Abnormal   Collection Time: 11/24/14 12:00 PM  Result Value Ref Range   TSH 9.700 (H) 0.350 - 4.500 uIU/mL  Troponin I-(serum)     Status: None   Collection Time: 11/24/14  4:40 PM  Result Value Ref Range   Troponin I <0.30 <0.30 ng/mL    Comment:        Due to the release kinetics of cTnI, a negative result within the first hours of the onset of symptoms does not rule out myocardial infarction with certainty. If myocardial infarction is still suspected, repeat the test at appropriate intervals.   Troponin I-(serum)     Status: None   Collection Time: 11/24/14 11:00 PM  Result Value Ref Range   Troponin I <0.30 <0.30 ng/mL    Comment:        Due to the release kinetics of cTnI, a negative result within the first hours of the onset of symptoms does not rule out myocardial infarction with certainty. If myocardial infarction is still suspected, repeat the test at appropriate intervals.   Basic metabolic panel     Status: Abnormal   Collection Time: 11/25/14  2:26 AM  Result Value Ref Range   Sodium 133 (L) 137 - 147 mEq/L   Potassium 5.1 3.7 - 5.3 mEq/L   Chloride 95 (L) 96 - 112 mEq/L   CO2 20 19 - 32 mEq/L   Glucose, Bld 107 (H) 70 - 99 mg/dL   BUN 20 6 - 23 mg/dL   Creatinine, Ser 0.85 0.50 - 1.35 mg/dL   Calcium 9.2 8.4 - 10.5 mg/dL   GFR calc non Af Amer >90 >90 mL/min   GFR calc Af Amer >90 >90 mL/min    Comment: (NOTE) The eGFR has been calculated using the CKD EPI equation. This calculation has not been validated in all clinical situations. eGFR's persistently <90 mL/min signify possible Chronic Kidney Disease.    Anion gap 18 (H) 5 - 15  CBC     Status: Abnormal   Collection Time: 11/25/14  2:26 AM  Result Value Ref Range   WBC 12.3 (H) 4.0 - 10.5 K/uL   RBC 3.73 (L) 4.22 - 5.81 MIL/uL   Hemoglobin 11.5 (L) 13.0 - 17.0 g/dL   HCT 34.3 (L) 39.0 - 52.0 %   MCV 92.0 78.0 - 100.0 fL   MCH 30.8 26.0 - 34.0 pg   MCHC 33.5 30.0 - 36.0 g/dL   RDW 13.3 11.5 -  15.5 %   Platelets 374 150 - 400 K/uL  Basic metabolic panel     Status: Abnormal   Collection Time: 11/26/14  2:35 AM  Result Value Ref Range   Sodium 135 (L) 137 - 147 mEq/L   Potassium 4.2 3.7 - 5.3 mEq/L    Comment: DELTA CHECK NOTED NO VISIBLE HEMOLYSIS    Chloride 96 96 - 112 mEq/L   CO2 27 19 - 32 mEq/L   Glucose, Bld 107 (H) 70 - 99 mg/dL   BUN 18 6 - 23 mg/dL   Creatinine, Ser 0.83 0.50 - 1.35 mg/dL   Calcium 8.9 8.4 - 10.5 mg/dL   GFR calc  non Af Amer >90 >90 mL/min   GFR calc Af Amer >90 >90 mL/min    Comment: (NOTE) The eGFR has been calculated using the CKD EPI equation. This calculation has not been validated in all clinical situations. eGFR's persistently <90 mL/min signify possible Chronic Kidney Disease.    Anion gap 12 5 - 15  Sedimentation rate     Status: Abnormal   Collection Time: 11/26/14  2:35 AM  Result Value Ref Range   Sed Rate 114 (H) 0 - 16 mm/hr  CBC     Status: Abnormal   Collection Time: 11/26/14  2:35 AM  Result Value Ref Range   WBC 10.8 (H) 4.0 - 10.5 K/uL   RBC 3.52 (L) 4.22 - 5.81 MIL/uL   Hemoglobin 11.0 (L) 13.0 - 17.0 g/dL   HCT 32.8 (L) 39.0 - 52.0 %   MCV 93.2 78.0 - 100.0 fL   MCH 31.3 26.0 - 34.0 pg   MCHC 33.5 30.0 - 36.0 g/dL   RDW 13.0 11.5 - 15.5 %   Platelets 374 150 - 400 K/uL  Pro b natriuretic peptide (BNP)     Status: Abnormal   Collection Time: 11/26/14  2:35 AM  Result Value Ref Range   Pro B Natriuretic peptide (BNP) 6302.0 (H) 0 - 125 pg/mL    Imaging: Ct Angio Chest Pe W/cm &/or Wo Cm  11/24/2014   CLINICAL DATA:  Chest pain and difficulty breathing  EXAM: CT ANGIOGRAPHY CHEST WITH CONTRAST  TECHNIQUE: Multidetector CT imaging of the chest was performed using the standard protocol during bolus administration of intravenous contrast. Multiplanar CT image reconstructions and MIPs were obtained to evaluate the vascular anatomy.  CONTRAST:  180m OMNIPAQUE IOHEXOL 350 MG/ML SOLN  COMPARISON:  Chest radiograph  November 22, 2014  FINDINGS: There is no demonstrable pulmonary embolus. There is no appreciable thoracic aortic aneurysm.  There is a moderate pericardial effusion. Heart is enlarged with left ventricular hypertrophy. There is relative narrowing of the right ventricle.  There are bilateral pleural effusions with bibasilar consolidation, slightly larger on the left than on the right. There is evidence of diffuse interstitial fibrosis. There may be some superimposed interstitial edema. There is patchy honeycombing in both upper lobes, more on the left than on the right. These areas may represent localized interstitial edema in the presence of interstitial fibrosis. These areas also may represent some localized atelectatic change.  There are multiple small lymph nodes but no frank adenopathy.  In the visualized upper abdomen, there is atherosclerotic change in the aorta. There are no blastic or lytic bone lesions. Thyroid appears within normal limits.  Review of the MIP images confirms the above findings.  IMPRESSION: No demonstrable pulmonary embolus.  Findings felt to represent congestive heart failure superimposed on interstitial fibrosis.  Moderate pericardial effusion. Note that there is narrowing of the right ventricular lumen. Etiology and significance of this finding uncertain. This finding may warrant echocardiography to further evaluate if echocardiography has not been performed recently.  Multiple small lymph nodes but no frank adenopathy.   Electronically Signed   By: WLowella GripM.D.   On: 11/24/2014 14:52    Assessment:  Principal Problem:   Dressler's syndrome Active Problems:   HTN (hypertension)   CAD S/P urgent LAD DES, POBA to Dx1 11/10/14 ([facility]   Ischemic cardiomyopathy- 20%-Life Vest   Dyspnea   VF arrest- shocked x 1 156/38/93  Chronic systolic CHF (congestive heart failure)   Hypotension  Precordial pain   Chest pain   Plan:  1.   Continue daily prednisone and  colchicine. IV diuresis today 40 mg x 2. May switch back to lasix 40 mg daily (higher dose) tomorrow. Transfer to stepdown today. This is probably a mixed (ishemic/non-ischemic cardiomyopathy) - his EF is reduced out of proportion for single vessel CAD. There is now active inflammation with rising ESR.   Time Spent Directly with Patient:  30 minutes   Length of Stay:  LOS: 2 days   Pixie Casino, MD, [facility] Attending Cardiologist [facility]  HILTY,Kenneth C 11/26/2014, 8:18 AM

## 2014-11-26 NOTE — Progress Notes (Signed)
Utilization Review Completed.   Juandedios Dudash, RN, BSN Nurse Case Manager  

## 2014-11-27 ENCOUNTER — Other Ambulatory Visit: Payer: BC Managed Care – PPO

## 2014-11-27 ENCOUNTER — Ambulatory Visit: Payer: Self-pay | Admitting: Cardiology

## 2014-11-27 DIAGNOSIS — R7989 Other specified abnormal findings of blood chemistry: Secondary | ICD-10-CM

## 2014-11-27 DIAGNOSIS — I319 Disease of pericardium, unspecified: Secondary | ICD-10-CM

## 2014-11-27 LAB — BASIC METABOLIC PANEL
Anion gap: 13 (ref 5–15)
BUN: 21 mg/dL (ref 6–23)
CO2: 26 mEq/L (ref 19–32)
CREATININE: 0.88 mg/dL (ref 0.50–1.35)
Calcium: 8.6 mg/dL (ref 8.4–10.5)
Chloride: 98 mEq/L (ref 96–112)
GFR, EST NON AFRICAN AMERICAN: 89 mL/min — AB (ref >90)
Glucose, Bld: 93 mg/dL (ref 70–99)
Potassium: 3.8 mEq/L (ref 3.7–5.3)
Sodium: 137 mEq/L (ref 137–147)

## 2014-11-27 LAB — PRO B NATRIURETIC PEPTIDE: Pro B Natriuretic peptide (BNP): 9164 pg/mL — ABNORMAL HIGH (ref 0–125)

## 2014-11-27 MED ORDER — FUROSEMIDE 40 MG PO TABS: 40.0000 mg | ORAL_TABLET | Freq: Every day | ORAL | Status: DC

## 2014-11-27 MED ORDER — PREDNISONE 20 MG PO TABS: ORAL_TABLET | ORAL | Status: DC

## 2014-11-27 MED ORDER — COLCHICINE 0.6 MG PO TABS: 0.6000 mg | ORAL_TABLET | Freq: Two times a day (BID) | ORAL | Status: DC

## 2014-11-27 MED ORDER — ALPRAZOLAM 0.25 MG PO TABS: 0.2500 mg | ORAL_TABLET | Freq: Every evening | ORAL | Status: DC | PRN

## 2014-11-27 NOTE — Progress Notes (Signed)
DC orders received.  Patient stable with no S/S of distress.  Medication and discharge information reviewed with patient and patient's wife.  Patient DC home with wife. Ricka Westra Marie  

## 2014-11-27 NOTE — Discharge Instructions (Signed)
Directions for taking Prednisone:  Start with 40 mg for 2 weeks then gradually reduce dose as follows:  Take 40 mg total (2 tablets/day) for 2 weeks. Then take 20 mg total (1 tablet/day) for 1 week. Then take 10 mg total (1/2 tablet/day) for 1 week.

## 2014-11-27 NOTE — Discharge Summary (Signed)
Physician Discharge Summary  Patient ID: Kyle Bullock MRN: [id number] DOB/AGE: [birth year] 63 y.o.  Admit date: 11/24/2014 Discharge date: 11/27/2014  Admission Diagnoses: Dressler's Syndrome  Discharge Diagnoses:  Principal Problem:   Dressler's syndrome Active Problems:   HTN (hypertension)   CAD S/P urgent LAD DES, POBA to Dx1 11/10/14 [facility])   Ischemic cardiomyopathy- 20%-Life Vest   Dyspnea   VF arrest- shocked x 1 64/83/15   Chronic systolic CHF (congestive heart failure)   Hypotension   Precordial pain   Chest pain   Pericardial effusion   Discharged Condition: stable    Hospital Course: The patient is a 63 y/o male, followed by Dr. Burt Knack, who was re-admitted, once again, on 11/25/14 for chest pain. He has had multiple admissions over the last 4 weeks for cardiac reasons. He initially had an anterior MI and cardiac arrest in [place] on 11/10/14. He was taken to [facility] and he had a VF arrest en route. He was shocked x 1 to NSR. Urgent cath revealed total LAD occlusion at the takeoff of the first diagonal with 95% stenosis of D1. He underwent POBA of the D1 and LAD DES with a Xience stent. His EF at cath and by echo was 20%. He was discharged 11/15/14 on ASA, Brilinta, Coreg, Lasix, Lipitor, Zerstril 2.5 mg. and a Armed forces training and education officer. He was re-admitted on 12/10 with SOB. He was thought to have mild CHF. He improved after diuresis. Also his Brilinta was stopped and he was started on Effient. He did have mild troponin elevation, felt to be residual from the previous MI.   He presented back to [facility], less that 48 hrs later, 12/13 with complaints of chest pain. The pain was described to be unlike his previous angina. It was dull but with a sharp component. Located in the left upper chest and shoulder blade. It was also pleuritic. He denied associated N/V, dyspnea, orthopnea, PND, cough, fever or chills. In the ED, his EKG demonstrated some  minimal ST change (less than 1/2 mm in the anterior precordial leads. However, on careful review of two prior EKGs this was not significantly changed. He was noted to have anterior Q waves. Bedside echo demonstrated anterior wall akinesis as previously described. However, there was no pericardial effusion. The decision was made to admit. Cardiac enzymes were cycled and were mildly elevated, felt to be residual from previous MI. However, later in the night, he developed increasing CP and he was taken back to the cath lab. This was performed by Dr. Gwenlyn Found and showed a widely patent LAD stent. His other vessels appeared patent without evidence of a culprit lesion. Pericarditis was felt to be the cause of his pain, however no medical therapy was initiated. He was continued on ASA, BB, Effient, statin.  He was discharged home 11/22/14.   He was seen in clinic for post-hospital f/u on 11/24/14. During visit, patient developed severe 10/10 CP. He was noted to have an ashen appearance and was anxious. BP was stable and 102/70. His EKG appeared to be unchanged from previous. His CP was noted to have a pleuritic component and not relieved with SL NTG. The patient was evaluated by Dr. Marlou Porch and it was recommended that he be re-admitted for further w/u. Enzymes were negative. BMP revealed normal SCr and CBC did not show significant anemia. A CT of the chest was ordered and was negative for PE. A 2D echo demonstrated no change in EF (20-25%). There was  noted to be a small circumferential pericardial effusion but no tamponade physiology was suspected.  ESR was markedly elevated at 80. It was decided to initiate treatment for pericarditis/ Dressler's Syndrome. As he has a newly placed DES and is on DAPT, he was determined not a candidate for NSAID therapy. Subsequently, he ws placed on colchicine BID as well as prednisone. His symptoms improved. All other meds were continued except for his losartan. This was held due to  problems with hypotension. He will need to be re-evaluted as an OP and re-initiation of a low dose of losartan will need to be considered. Lasix was increased to 40 mg for volume control.   On hospital day 3, he was seen and examined by Dr. Debara Pickett who determined he was stable for discharge home. He recommended continuation of colchicine for at least 3 months. It was recommended that he be tapered off of prednisone. Recommended regimen was for 0.5 mg/kg/day (~40 mg/d) for 2 weeks then slow taper over the following 2 weeks (20 mg/d for 1 week, then 10 mg/d for 1 week). Per Dr. Debara Pickett,  a short supply of Xanax, 0.25 mg, to be used PRN QHS for sleep was prescribed. He was given 14 tablets (2 week supply) with no refills. Post-hospital f/u has been arranged with Melina Copa, PA-C, 12/04/14.   Consults: None  Significant Diagnostic Studies:   CT of chest 12/04/14  IMPRESSION: No demonstrable pulmonary embolus.  Findings felt to represent congestive heart failure superimposed on interstitial fibrosis.  Moderate pericardial effusion. Note that there is narrowing of the right ventricular lumen. Etiology and significance of this finding uncertain. This finding may warrant echocardiography to further evaluate if echocardiography has not been performed recently.  Multiple small lymph nodes but no frank adenopathy.   2D echo 11/25/14 Study Conclusions  - Left ventricle: The cavity size was normal. Wall thickness was normal. Systolic function was severely reduced. The estimated ejection fraction was in the range of 20% to 25%. Diastolic dysfunction with elevated LV Filling pressure. Diffuse hypokinesis. - Mitral valve: Mildly thickened leaflets . There was mild regurgitation. - Left atrium: The atrium was at the upper limits of normal in size. - Right atrium: The atrium was mildly dilated. - Pericardium, extracardiac: Small circumferential pericardial effusion. Tamponade  physiology is not suspect, but cannot be ruled-out on the basis of this study.  Impressions:  - Compared to the prior echo in 11/2014, there is now a small circumferential pericardial effusion - no clear tamponade physiology, however, the IVC is dilated. There has been no improvement in LVEF.  Treatments: See Hospital Course  Discharge Exam: Blood pressure 103/66, pulse 77, temperature 97.9 F (36.6 C), temperature source Oral, resp. rate 20, height '6\' 3"'  (1.905 m), weight 184 lb 4.8 oz (83.598 kg), SpO2 99 %.   Disposition: 01-Home or Self Care      Discharge Instructions    Diet - low sodium heart healthy    Complete by:  As directed      Discharge instructions    Complete by:  As directed   Directions for taking Prednisone: Start with 40 mg for 2 weeks then gradually reduce dose as follows:  Take 40 mg total (2 tablets/day) for 2 weeks. Then take 20 mg total (1 tablet/day) for 1 week. Then take 10 mg total (1/2 tablet/day) for 1 week.     Increase activity slowly    Complete by:  As directed  Medication List    STOP taking these medications        losartan 25 MG tablet  Commonly known as:  COZAAR     zolpidem 10 MG tablet  Commonly known as:  AMBIEN      TAKE these medications        ALPRAZolam 0.25 MG tablet  Commonly known as:  XANAX  Take 1 tablet (0.25 mg total) by mouth at bedtime as needed for anxiety.     aspirin EC 81 MG tablet  Take 81 mg by mouth daily.     atorvastatin 20 MG tablet  Commonly known as:  LIPITOR  Take 20 mg by mouth at bedtime.     carvedilol 12.5 MG tablet  Commonly known as:  COREG  Take 1.5 tablets (18.75 mg total) by mouth 2 (two) times daily with a meal.     colchicine 0.6 MG tablet  Take 1 tablet (0.6 mg total) by mouth 2 (two) times daily.     furosemide 40 MG tablet  Commonly known as:  LASIX  Take 1 tablet (40 mg total) by mouth daily.     nitroGLYCERIN 0.4 MG SL tablet  Commonly known  as:  NITROSTAT  Place 1 tablet (0.4 mg total) under the tongue every 5 (five) minutes as needed for chest pain.     potassium chloride SA 20 MEQ tablet  Commonly known as:  K-DUR,KLOR-CON  Take 40 mEq by mouth daily.     prasugrel 10 MG Tabs tablet  Commonly known as:  EFFIENT  Take 1 tablet (10 mg total) by mouth daily.     predniSONE 20 MG tablet  Commonly known as:  DELTASONE  Take as directed     tadalafil 20 MG tablet  Commonly known as:  CIALIS  Take 20 mg by mouth as needed for erectile dysfunction.     VISINE OP  Place 1 drop into both eyes daily as needed (pain).       Follow-up Information    Follow up with Melina Copa, PA-C On 12/04/2014.   Specialty:  Cardiology   Why:  11:00 am    Contact information:   [address] [place] [address] [phone]      TIME SPENT ON DISCHARGE, INCLUDING PHYSICIAN TIME: >30 MINUTES  Signed: Lyda Jester 11/27/2014, 3:49 PM

## 2014-11-27 NOTE — Progress Notes (Addendum)
DAILY PROGRESS NOTE  Subjective:  No chest pain overnight. Good diuresis, still mildly volume overloaded. Will need increased dose lasix at home. Down 4 lbs since yesterday - BNP increased? May be due to pericardial inflammation.   Objective:  Temp:  [97.7 F (36.5 C)-98.3 F (36.8 C)] 97.9 F (36.6 C) (12/21 0755) Pulse Rate:  [72-88] 77 (12/21 0526) Resp:  [18-22] 20 (12/21 0755) BP: (93-133)/(53-75) 103/66 mmHg (12/21 0755) SpO2:  [95 %-99 %] 99 % (12/21 0755) Weight:  [184 lb 4.8 oz (83.598 kg)] 184 lb 4.8 oz (83.598 kg) (12/21 0500) Weight change: -3 lb 15.6 oz (-1.802 kg)  Intake/Output from previous day: 12/20 0701 - 12/21 0700 In: 1020 [P.O.:1020] Out: 2000 [Urine:2000]  Intake/Output from this shift: Total I/O In: 480 [P.O.:480] Out: -   Medications: Current Facility-Administered Medications  Medication Dose Route Frequency Provider Last Rate Last Dose  . 0.9 %  sodium chloride infusion  250 mL Intravenous PRN Dayna N Dunn, PA-C      . acetaminophen (TYLENOL) tablet 650 mg  650 mg Oral Q4H PRN Dayna N Dunn, PA-C      . ALPRAZolam Duanne Moron) tablet 0.25 mg  0.25 mg Oral QHS PRN Stephani Police, MD   0.25 mg at 11/26/14 2220  . antiseptic oral rinse (CPC / CETYLPYRIDINIUM CHLORIDE 0.05%) solution 7 mL  7 mL Mouth Rinse BID Candee Furbish, MD   7 mL at 11/27/14 1000  . aspirin EC tablet 81 mg  81 mg Oral Daily Dayna N Dunn, PA-C   81 mg at 11/27/14 3235  . atorvastatin (LIPITOR) tablet 20 mg  20 mg Oral QHS Dayna N Dunn, PA-C   20 mg at 11/26/14 2220  . carvedilol (COREG) tablet 12.5 mg  12.5 mg Oral BID WC Dayna N Dunn, PA-C   12.5 mg at 11/27/14 5732  . colchicine tablet 0.6 mg  0.6 mg Oral BID Consuelo Pandy, PA-C   0.6 mg at 11/27/14 2025  . guaiFENesin-dextromethorphan (ROBITUSSIN DM) 100-10 MG/5ML syrup 5 mL  5 mL Oral Q4H PRN Candee Furbish, MD   5 mL at 11/25/14 0512  . heparin injection 5,000 Units  5,000 Units Subcutaneous 3 times per day Charlie Pitter, PA-C    5,000 Units at 11/27/14 0526  . morphine 2 MG/ML injection 2-3 mg  2-3 mg Intravenous Q2H PRN Troy Sine, MD   3 mg at 11/24/14 1505  . nitroGLYCERIN (NITROSTAT) SL tablet 0.4 mg  0.4 mg Sublingual Q5 min PRN Dayna N Dunn, PA-C      . ondansetron (ZOFRAN) injection 4 mg  4 mg Intravenous Q6H PRN Dayna N Dunn, PA-C      . potassium chloride SA (K-DUR,KLOR-CON) CR tablet 40 mEq  40 mEq Oral Daily Pixie Casino, MD   40 mEq at 11/27/14 0929  . prasugrel (EFFIENT) tablet 10 mg  10 mg Oral Daily Dayna N Dunn, PA-C   10 mg at 11/27/14 4270  . predniSONE (DELTASONE) tablet 40 mg  40 mg Oral Q breakfast Pixie Casino, MD   40 mg at 11/27/14 0929  . sodium chloride 0.9 % injection 3 mL  3 mL Intravenous Q12H Dayna N Dunn, PA-C   3 mL at 11/27/14 0929  . sodium chloride 0.9 % injection 3 mL  3 mL Intravenous PRN Charlie Pitter, PA-C        Physical Exam: General appearance: alert and no distress Lungs: clear to auscultation bilaterally Heart: regular rate  and rhythm, S1, S2 normal, no murmur, click, rub or gallop Extremities: extremities normal, atraumatic, no cyanosis or edema Pulses: 2+ and symmetric  Lab Results: Results for orders placed or performed during the hospital encounter of 11/24/14 (from the past 48 hour(s))  Basic metabolic panel     Status: Abnormal   Collection Time: 11/26/14  2:35 AM  Result Value Ref Range   Sodium 135 (L) 137 - 147 mEq/L   Potassium 4.2 3.7 - 5.3 mEq/L    Comment: DELTA CHECK NOTED NO VISIBLE HEMOLYSIS    Chloride 96 96 - 112 mEq/L   CO2 27 19 - 32 mEq/L   Glucose, Bld 107 (H) 70 - 99 mg/dL   BUN 18 6 - 23 mg/dL   Creatinine, Ser 0.83 0.50 - 1.35 mg/dL   Calcium 8.9 8.4 - 10.5 mg/dL   GFR calc non Af Amer >90 >90 mL/min   GFR calc Af Amer >90 >90 mL/min    Comment: (NOTE) The eGFR has been calculated using the CKD EPI equation. This calculation has not been validated in all clinical situations. eGFR's persistently <90 mL/min signify possible  Chronic Kidney Disease.    Anion gap 12 5 - 15  Sedimentation rate     Status: Abnormal   Collection Time: 11/26/14  2:35 AM  Result Value Ref Range   Sed Rate 114 (H) 0 - 16 mm/hr  CBC     Status: Abnormal   Collection Time: 11/26/14  2:35 AM  Result Value Ref Range   WBC 10.8 (H) 4.0 - 10.5 K/uL   RBC 3.52 (L) 4.22 - 5.81 MIL/uL   Hemoglobin 11.0 (L) 13.0 - 17.0 g/dL   HCT 32.8 (L) 39.0 - 52.0 %   MCV 93.2 78.0 - 100.0 fL   MCH 31.3 26.0 - 34.0 pg   MCHC 33.5 30.0 - 36.0 g/dL   RDW 13.0 11.5 - 15.5 %   Platelets 374 150 - 400 K/uL  Pro b natriuretic peptide (BNP)     Status: Abnormal   Collection Time: 11/26/14  2:35 AM  Result Value Ref Range   Pro B Natriuretic peptide (BNP) 6302.0 (H) 0 - 125 pg/mL  Basic metabolic panel     Status: Abnormal   Collection Time: 11/27/14  5:51 AM  Result Value Ref Range   Sodium 137 137 - 147 mEq/L   Potassium 3.8 3.7 - 5.3 mEq/L   Chloride 98 96 - 112 mEq/L   CO2 26 19 - 32 mEq/L   Glucose, Bld 93 70 - 99 mg/dL   BUN 21 6 - 23 mg/dL   Creatinine, Ser 0.88 0.50 - 1.35 mg/dL   Calcium 8.6 8.4 - 10.5 mg/dL   GFR calc non Af Amer 89 (L) >90 mL/min   GFR calc Af Amer >90 >90 mL/min    Comment: (NOTE) The eGFR has been calculated using the CKD EPI equation. This calculation has not been validated in all clinical situations. eGFR's persistently <90 mL/min signify possible Chronic Kidney Disease.    Anion gap 13 5 - 15  Pro b natriuretic peptide (BNP)     Status: Abnormal   Collection Time: 11/27/14  5:51 AM  Result Value Ref Range   Pro B Natriuretic peptide (BNP) 9164.0 (H) 0 - 125 pg/mL    Imaging: No results found.  Assessment:  Principal Problem:   Dressler's syndrome Active Problems:   HTN (hypertension)   CAD S/P urgent LAD DES, POBA to Dx1 11/10/14 [facility])  Ischemic cardiomyopathy- 20%-Life Vest   Dyspnea   VF arrest- shocked x 1 09/73/53   Chronic systolic CHF (congestive heart failure)   Hypotension    Precordial pain   Chest pain   Pericardial effusion   Plan:  1.   Continues to feel better. Labs stable. Safe for discharge home today. Would send home on increased dose PO lasix 40 mg daily. Continue prednisone and colchicine as I outlined in my note on Saturday. He has a follow-up appointment on 12/28 with Melina Copa, PA-C.  May wish to consider outpatient cMRI - ?if he has a myopericarditis, still difficult to explain low EF which is out of proportion to single vessel CAD.  Continue wearing LifeVest after discharge. He is requesting Xanax after discharge - would give a short supply of 0.25 mg prn QHS for sleep.  Time Spent Directly with Patient:  30 minutes   Length of Stay:  LOS: 3 days   Pixie Casino, MD, [facility] Attending Cardiologist [facility]  HILTY,Kenneth C 11/27/2014, 10:01 AM

## 2014-11-27 NOTE — Progress Notes (Signed)
Patient requested information on Dressler's syndrome. Printed information from the [facility] website was provide to the patient.

## 2014-11-28 ENCOUNTER — Telehealth: Payer: Self-pay | Admitting: Cardiovascular Disease

## 2014-11-28 LAB — BRAIN NATRIURETIC PEPTIDE: Brain Natriuretic Peptide: 1505.4 pg/mL — ABNORMAL HIGH (ref 0.0–100.0)

## 2014-11-28 NOTE — Telephone Encounter (Signed)
pt and his wife notified about lab results that were from 12/18 appt however; pt was then admitted same day. I did advise at next appt 12/28 to make sure to have a DPR filled out so that we may have permisson to s/w wife as well if pt was not avaiable. Wife also had question in regards to was he supposed to not be taking the losartan any longer. SBP has been around the 90. I advised that is why losartan was stopped. Wife also asked if pt could take immodium if his diarrhea comes back, she said they gave him immodium in the hospital, I said that would be ok however if the diarrhea is coming back to let us know. Pt has a follow up on 12/28. Wife verbalized understanding to all instructions and Plan of Care.

## 2014-11-28 NOTE — Telephone Encounter (Signed)
New message      Pt was discharged from hosp yesterday.  Losartan was not on the most recent list.  Is he supposed to be taking the losartan.  Also, last night pt had diarrhea.  Can he have immodial?

## 2014-12-03 ENCOUNTER — Encounter: Payer: Self-pay | Admitting: Physician Assistant

## 2014-12-03 NOTE — Progress Notes (Signed)
 [address] [place], [place]  [address] Phone: [phone] Fax:  [phone]  Date:  12/04/2014   Patient ID:  Kyle Bullock, DOB 07/01/1951, MRN [id number]   PCP:  No PCP Per Patient  Cardiologist:  Cooper Electrophysiologist:  Allred   History of Present Illness: Kyle Bullock is a 63 y.o. male with history of recent CAD/VF arrest s/p PCI, ICM EF 20%, HTN, tobacco abuse, dyslipidemia, RBBB, and 3 recent readmissions post-STEMl (first for NSVT, a second for SOB/CP ?pericarditis/CHF, and a third for pericarditis/pericardial effusion). He presents for post-hospital followup.   He and his wife were in [place] visiting on 11/10/14 when he developed chest pain. EMS was called. En route to [facility] he had a VF arrest. He was shocked x 1 to NSR. In the ER STEMI was called, EKG revealed RBBB with AL ST elevation. Urgent cath revealed total LAD at the takeoff of the Dx1 with 95% Dx1. He underwent Dx1 POBA and LAD DES with a Xience stent. His EF at cath and by echo was 20%. His post MI course was unremarkable. He was discharged 11/15/14 on ASA, Brilinta, Coreg, Lasix, Lipitor, Zerstril 2.5 mg. and a LifeVest. He lives here locally and had an appointment to establish care in our office. However, before that appointment he presented back to the ED with intermittent SOB and anxiety but no recurrent CP. In the ER here at [facility], he had 18 bt of NSVT (rate 135-140 bpm) - his Life Vest did not fire. Dyspnea was felt multifactorial from Brilinta and acute systolic CHF so he was also treated with IV Lasix. He was changed to Effient. Troponins showed POC 7.35 but full set 2.26-2.44-2.31 - Dr. Cooper felt this was residual elevation from the recent infarct with an essentially flat trend. Dr. Allred with EP consulted on the case. He reviewed the Lifevest which showed no arrhythmias > 150 bpm and no therapies delivered. He recommended to keep K>3.9 and Mg>1.9. The patient  was instructed not to drive for 6 months. Dr. Allred reported "Though we could consider EP study and possible ICD if inducible ventricular arrhythmias are found, I think that a more conservative approach would be to continue medical therapy with lifevest in place and then reassess EF after 90 days post revascularization." The patient agreed with this plan. F/u scheduled in 12/2014 with EP. Lisinopril was changed to ARB for dry cough.  He was due to f/u in clinic with me following that discharge but returned back to the hospital 12/13-12/16 with precordial chest pain eventually felt due to pericarditis. Relook cath showed a widely patent stent without evidence of a culprit lesion. Echo 11/20/14: septal and apical akinesis, LV cavity severely dilated, EF 20-25%, diffuse HK, mild MR, normal pericardial appearance. His Coreg was titrated down due to hypotension. He was otherwise discharged on prior home meds. He presented back to the clinic on 11/24/14 for follow-up at which time he was reporting continued fatigue and dyspnea. After having labs drawn, he developed 10/10 chest pain (somewhat pleuritic) not relieved with NTG, with associated hypotension. He was transferred to [facility] and CTA showed no PE, +CHF superimposed on interstitial fibrosis, mod pericardial effusion. Enzymes were negative. BMP revealed normal SCr and CBC did not show significant anemia. A 2D echo demonstrated no change in EF (20-25%). There was noted to be a small circumferential pericardial effusion but no tamponade physiology. ESR was markedly elevated at 80. It was decided   to initiate treatment for pericarditis/Dressler's Syndrome. As he has a newly placed DES and is on DAPT, he was not a candidate for NSAID therapy. Subsequently, he was placed on colchicine BID as well as prednisone taper by Dr. Hilty. Losartan was discontinued due to hypotension.  He presents back to clinic feeling much better. His color is much better. He reports  improved appetite and sleeping. No further CP, SOB, orthopnea. No LEE, palpitations or syncope. BP running soft at home in the low 100s. Yesterday he had a value of 88/57 but was asymptomatic and subsequent values stable in the low 100s. No presyncope or dizziness.  Recent Labs: 11/19/2014: ALT 64* 11/24/2014: TSH 9.700* 11/26/2014: Hemoglobin 11.0* 11/27/2014: Creatinine 0.88; Potassium 3.8; Pro B Natriuretic peptide (BNP) 9164.0*  Wt Readings from Last 3 Encounters:  12/04/14 186 lb (84.369 kg)  11/27/14 184 lb 4.8 oz (83.598 kg)  11/24/14 193 lb 9.6 oz (87.816 kg)     Past Medical History  Diagnosis Date  . Hypertension   . Ischemic cardiomyopathy     a. 11/2014: EF reportedly 20% by cath and echo in [place].  . VF (ventricular fibrillation) 11/10/14    a. 11/2014: arrest with STEMI.  . Myocardial infarction 11/10/2014  . NSVT (nonsustained ventricular tachycardia) 11/16/14    a. 11/2014: admitted after discharge from STEMI admission, 18 beats 135-140bpm in ED.  . CAD (coronary artery disease)     a. 11/2014: arrest/STEMI s/p Dx 1 POBA and DES to LAD ([place]). b. Relook cath 11/2014: no culprit, patent stent.  . Dyspnea     a. 11/2014: possibly due to combo of CHF and Brilinta. Brilinta changed to Effient.  . Tobacco abuse   . RBBB   . Dyslipidemia   . Abnormal TSH     a. 11/2014: felt d/t sick euthyroid.  . Pericarditis   . Sleep apnea     suspected but not diagnosed  . Dressler's syndrome     a. 11/2014: tx with colchicine and prednisone.  . Pericardial effusion     a. 11/2014.  . Erectile dysfunction     a. Pt aware not to take ED med within 24 hr of NTG and vice versa.    Current Outpatient Prescriptions  Medication Sig Dispense Refill  . aspirin EC 81 MG tablet Take 81 mg by mouth daily.    . atorvastatin (LIPITOR) 20 MG tablet Take 20 mg by mouth at bedtime.    . carvedilol (COREG) 12.5 MG tablet Take 1.5 tablets (18.75 mg total) by mouth 2 (two) times  daily with a meal. 90 tablet 5  . colchicine 0.6 MG tablet Take 1 tablet (0.6 mg total) by mouth 2 (two) times daily. 60 tablet 4  . furosemide (LASIX) 40 MG tablet Take 1 tablet (40 mg total) by mouth daily. 30 tablet 5  . potassium chloride SA (K-DUR,KLOR-CON) 20 MEQ tablet Take 40 mEq by mouth daily.    . prasugrel (EFFIENT) 10 MG TABS tablet Take 1 tablet (10 mg total) by mouth daily. 30 tablet 11  . predniSONE (DELTASONE) 20 MG tablet Take as directed 40 tablet 0  . Tetrahydrozoline HCl (VISINE OP) Place 1 drop into both eyes daily as needed (pain).    . ALPRAZolam (XANAX) 0.25 MG tablet Take 1 tablet (0.25 mg total) by mouth at bedtime as needed for anxiety. (Patient not taking: Reported on 12/04/2014) 15 tablet 0  . nitroGLYCERIN (NITROSTAT) 0.4 MG SL tablet Place 1 tablet (0.4 mg total) under   the tongue every 5 (five) minutes as needed for chest pain. (Patient not taking: Reported on 12/04/2014)    . tadalafil (CIALIS) 20 MG tablet Take 20 mg by mouth as needed for erectile dysfunction.     No current facility-administered medications for this visit.    Allergies:   Ambien; Lisinopril; and Brilinta   Social History:  The patient  reports that he quit smoking about 2 weeks ago. His smoking use included Cigarettes. He has a 80 pack-year smoking history. He has never used smokeless tobacco. He reports that he drinks alcohol. He reports that he does not use illicit drugs.   Family History:  The patient's family history includes Hypertension in an other family member. There is no history of CAD.   ROS:  Please see the history of present illness.  All other systems reviewed and negative.   PHYSICAL EXAM:  VS:  BP 100/70 mmHg  Pulse 62  Ht 6' 3" (1.905 m)  Wt 186 lb (84.369 kg)  BMI 23.25 kg/m2 Well nourished, well developed WM, in no acute distress - improved color of complexion HEENT: normal Neck: no JVD Cardiac:  normal S1, S2; RRR; no murmur Lungs:  clear to auscultation  bilaterally, no wheezing, rhonchi or rales Abd: soft, nontender, no hepatomegaly Ext: no edema Skin: warm and dry Neuro:  moves all extremities spontaneously, no focal abnormalities noted  EKG:  NSR 62bpm, RBBB, prior septal/lateral infarct, nonspecific ST-T changes     ASSESSMENT AND PLAN:  1. CAD with recent STEMI s/p DES to LAD, POBA Dx 1 complicated by Dressler's syndrome - doing much better on colchicine and prednisone taper. Will continue colchicine for at least 3 months - will defer ultimate duration per primary cardiologist. He c/o some hoarseness/scratchy voice since starting the prednisone - will add PPI for the duration of prednisone. No evidence of GIB or stomach upset. If hoarseness does not improve he will need to f/u PCP. Otherwise continue ASA, BB, statin, Effient. Refer to cardiac rehab to start next week. He is already back to walking regularly at home. 2. Chronic systolic CHF/ICM - appears euvolemic. Continue current regimen. Wife says his weight was 181 at home today, whereas his baseline is 184. I told them if he continues to drop we would consider backing down on the Lasix. They will notify our office if this is the case. 3. VF arrest, recent NSVT, on Lifevest - has f/u with EP already scheduled, timing of f/u echo TBD by Dr. Allred. 4. HTN with recent hypotension - BP still running softer at home. Will not resume ARB at this time. We can consider at subsequent OV. 5. Tobacco abuse - continued cessation advised. 6. Dyslipidemia - per guidelines will increase statin to 80mg qpm. Check baseline CMET/lipids today and recheck lipids/LFTs in 6 weeks. 7. Sleep disordered breathing - per prior OV, sleep study already scheduled.  Dispo: F/u Dr. Cooper in 6 weeks. F/u Dr. Allred 12/2014 as scheduled.  Signed, Dayna Dunn, PA-C  12/04/2014 9:35 AM   

## 2014-12-04 ENCOUNTER — Ambulatory Visit (INDEPENDENT_AMBULATORY_CARE_PROVIDER_SITE_OTHER): Payer: BC Managed Care – PPO | Admitting: Physician Assistant

## 2014-12-04 ENCOUNTER — Encounter: Payer: Self-pay | Admitting: Physician Assistant

## 2014-12-04 VITALS — BP 100/70 | HR 62 | Ht 75.0 in | Wt 186.0 lb

## 2014-12-04 DIAGNOSIS — Z87891 Personal history of nicotine dependence: Secondary | ICD-10-CM

## 2014-12-04 DIAGNOSIS — I5022 Chronic systolic (congestive) heart failure: Secondary | ICD-10-CM

## 2014-12-04 DIAGNOSIS — I959 Hypotension, unspecified: Secondary | ICD-10-CM

## 2014-12-04 DIAGNOSIS — I4901 Ventricular fibrillation: Secondary | ICD-10-CM

## 2014-12-04 DIAGNOSIS — I255 Ischemic cardiomyopathy: Secondary | ICD-10-CM

## 2014-12-04 DIAGNOSIS — Z9861 Coronary angioplasty status: Secondary | ICD-10-CM

## 2014-12-04 DIAGNOSIS — I10 Essential (primary) hypertension: Secondary | ICD-10-CM

## 2014-12-04 DIAGNOSIS — I2510 Atherosclerotic heart disease of native coronary artery without angina pectoris: Secondary | ICD-10-CM

## 2014-12-04 DIAGNOSIS — I241 Dressler's syndrome: Secondary | ICD-10-CM

## 2014-12-04 DIAGNOSIS — E785 Hyperlipidemia, unspecified: Secondary | ICD-10-CM

## 2014-12-04 MED ORDER — ATORVASTATIN CALCIUM 80 MG PO TABS: 80.0000 mg | ORAL_TABLET | Freq: Every day | ORAL | Status: DC

## 2014-12-04 MED ORDER — OMEPRAZOLE 20 MG PO CPDR: 20.0000 mg | DELAYED_RELEASE_CAPSULE | Freq: Every day | ORAL | Status: DC

## 2014-12-04 NOTE — Patient Instructions (Addendum)
Your physician has recommended you make the following change in your medication:    START TAKING OMEPRAZOLE 20 MG ONCE  DAY  ONE RX SENT IN TO PHARMACY NO REFILLS  START TAKING LIPITOR 80 MG ONCE A DAY    Your physician recommends that you return for lab work in:  TODAY  CMET  AND LIPIDS  AND IN 6 WEEKS  LIPIDS AND LFT FASTING    YOU ARE BEING  REFERRED TO CARDIAC REHAB YOUR DATA HAS BEEN COLLECTED AND WILL BE FAX TO [facility]   FOLLOW UP TO SEE DR COOPER IN 6 TO 8 WEEKS

## 2014-12-05 ENCOUNTER — Telehealth: Payer: Self-pay | Admitting: *Deleted

## 2014-12-05 LAB — COMPREHENSIVE METABOLIC PANEL
ALT: 54 IU/L — ABNORMAL HIGH (ref 0–44)
AST: 19 IU/L (ref 0–40)
Albumin/Globulin Ratio: 2.1 (ref 1.1–2.5)
Albumin: 4 g/dL (ref 3.6–4.8)
Alkaline Phosphatase: 103 IU/L (ref 39–117)
BILIRUBIN TOTAL: 0.3 mg/dL (ref 0.0–1.2)
BUN / CREAT RATIO: 20 (ref 10–22)
BUN: 20 mg/dL (ref 8–27)
CO2: 26 mmol/L (ref 18–29)
CREATININE: 1.02 mg/dL (ref 0.76–1.27)
Calcium: 9.3 mg/dL (ref 8.6–10.2)
Chloride: 100 mmol/L (ref 97–108)
GFR calc non Af Amer: 77 mL/min/{1.73_m2} (ref >59)
GFR, EST AFRICAN AMERICAN: 89 mL/min/{1.73_m2} (ref >59)
GLOBULIN, TOTAL: 1.9 g/dL (ref 1.5–4.5)
Glucose: 98 mg/dL (ref 65–99)
Potassium: 5 mmol/L (ref 3.5–5.2)
Sodium: 141 mmol/L (ref 134–144)
Total Protein: 5.9 g/dL — ABNORMAL LOW (ref 6.0–8.5)

## 2014-12-05 LAB — LIPID PANEL
CHOL/HDL RATIO: 2.3 ratio (ref 0.0–5.0)
Cholesterol, Total: 101 mg/dL (ref 100–199)
HDL: 43 mg/dL (ref >39)
LDL CALC: 39 mg/dL (ref 0–99)
Triglycerides: 94 mg/dL (ref 0–149)
VLDL Cholesterol Cal: 19 mg/dL (ref 5–40)

## 2014-12-05 MED ORDER — ATORVASTATIN CALCIUM 20 MG PO TABS: 20.0000 mg | ORAL_TABLET | Freq: Every day | ORAL | Status: DC

## 2014-12-05 NOTE — Telephone Encounter (Signed)
s/w pt and his wife while in their car about pt's lab results and to decrease K+ 20 meq daily and to go back to lipitor 20 mg daily based on lab results. Pt and wife verbalized understanding to plan of care.

## 2014-12-15 ENCOUNTER — Ambulatory Visit (INDEPENDENT_AMBULATORY_CARE_PROVIDER_SITE_OTHER): Payer: Medicare Other | Admitting: Internal Medicine

## 2014-12-15 ENCOUNTER — Encounter: Payer: Self-pay | Admitting: Internal Medicine

## 2014-12-15 VITALS — BP 88/60 | HR 60 | Ht 75.0 in | Wt 187.0 lb

## 2014-12-15 DIAGNOSIS — I5022 Chronic systolic (congestive) heart failure: Secondary | ICD-10-CM | POA: Diagnosis not present

## 2014-12-15 DIAGNOSIS — I4901 Ventricular fibrillation: Secondary | ICD-10-CM

## 2014-12-15 DIAGNOSIS — I4729 Other ventricular tachycardia: Secondary | ICD-10-CM

## 2014-12-15 DIAGNOSIS — I255 Ischemic cardiomyopathy: Secondary | ICD-10-CM

## 2014-12-15 DIAGNOSIS — I472 Ventricular tachycardia: Secondary | ICD-10-CM

## 2014-12-15 NOTE — Patient Instructions (Signed)
Your physician recommends that you schedule a follow-up appointment  with Dr Johney FrameAllred in mid March after the echo    Your physician has requested that you have an echocardiogram. Echocardiography is a painless test that uses sound waves to create images of your heart. It provides your doctor with information about the size and shape of your heart and how well your heart's chambers and valves are working. This procedure takes approximately one hour. There are no restrictions for this procedure.---after 02/09/15

## 2014-12-16 NOTE — Progress Notes (Signed)
Primary cardiologist:  Dr Excell Seltzer  The patient presents today for routine electrophysiology followup.  Since I saw him for EP consult in the hospital, he has had several additional hospitalizations for chest pain.  He underwent repeat cath which revealed patent stent.  He was subsequently diagnosed with pericarditis and placed on steroids.  He has done "mcuh better" since that time.  Today, he denies symptoms of palpitations,  lower extremity edema, dizziness, presyncope, syncope, or neurologic sequela.  He has occasional SOB and orthopnea but feels that this continues to improve.  His chest pain is currently resolved.  The patient feels that he is tolerating medications without difficulties and is otherwise without complaint today.   Past Medical History  Diagnosis Date  . Hypertension   . Ischemic cardiomyopathy     a. 11/2014: EF reportedly 20% by cath and echo in [place].  . VF (ventricular fibrillation) 11/10/14    a. 11/2014: arrest with STEMI.  Kyle Bullock Myocardial infarction 11/10/2014  . NSVT (nonsustained ventricular tachycardia) 11/16/14    a. 11/2014: admitted after discharge from STEMI admission, 18 beats 135-140bpm in ED.  Kyle Bullock CAD (coronary artery disease)     a. 11/2014: arrest/STEMI s/p Dx 1 POBA and DES to LAD [place], [place]). b. Relook cath 11/2014: no culprit, patent stent.  Kyle Bullock Dyspnea     a. 11/2014: possibly due to combo of CHF and Brilinta. Brilinta changed to Effient.  . Tobacco abuse   . RBBB   . Dyslipidemia   . Abnormal TSH     a. 11/2014: felt d/t sick euthyroid.  Kyle Bullock Pericarditis   . Sleep apnea     suspected but not diagnosed  . Dressler's syndrome     a. 11/2014: tx with colchicine and prednisone.  . Pericardial effusion     a. 11/2014.  Kyle Bullock Erectile dysfunction     a. Pt aware not to take ED med within 24 hr of NTG and vice versa.   Past Surgical History  Procedure Laterality Date  . Coronary stent placement  11/10/14    LAD DES  . Hand surgery Bilateral   .  Knee arthroscopy  1982  . Left heart cath N/A 11/19/2014    Procedure: LEFT HEART CATH;  Surgeon: Runell Gess, MD;  Location: [facility] CATH LAB;  Service: Cardiovascular;  Laterality: N/A;    Current Outpatient Prescriptions  Medication Sig Dispense Refill  . aspirin EC 81 MG tablet Take 81 mg by mouth daily.    Kyle Bullock atorvastatin (LIPITOR) 20 MG tablet Take 1 tablet (20 mg total) by mouth daily.    . carvedilol (COREG) 12.5 MG tablet Take 1.5 tablets (18.75 mg total) by mouth 2 (two) times daily with a meal. 90 tablet 5  . colchicine 0.6 MG tablet Take 1 tablet (0.6 mg total) by mouth 2 (two) times daily. 60 tablet 4  . furosemide (LASIX) 40 MG tablet Take 1 tablet (40 mg total) by mouth daily. 30 tablet 5  . nitroGLYCERIN (NITROSTAT) 0.4 MG SL tablet Place 1 tablet (0.4 mg total) under the tongue every 5 (five) minutes as needed for chest pain.    Kyle Bullock omeprazole (PRILOSEC) 20 MG capsule Take 1 capsule (20 mg total) by mouth daily. 30 capsule 0  . potassium chloride SA (K-DUR,KLOR-CON) 20 MEQ tablet Take 20 mEq by mouth daily.    . prasugrel (EFFIENT) 10 MG TABS tablet Take 1 tablet (10 mg total) by mouth daily. 30 tablet 11  . PREDNISONE PO Take 10  mg by mouth daily.    . Tetrahydrozoline HCl (VISINE OP) Place 1 drop into both eyes daily as needed (pain).     No current facility-administered medications for this visit.    Allergies  Allergen Reactions  . Ambien [Zolpidem Tartrate] Anxiety and Other (See Comments)    Causes nightmares  . Lisinopril Cough  . Brilinta [Ticagrelor]     ? Dyspnea. Changed to Effient 11/2014.    History   Social History  . Marital Status: Married    Spouse Name: N/A    Number of Children: 2  . Years of Education: N/A   Occupational History  . Not on file.   Social History Main Topics  . Smoking status: Former Smoker -- 2.00 packs/day for 40 years    Types: Cigarettes    Quit date: 11/16/2014  . Smokeless tobacco: Never Used  . Alcohol Use: 0.0  oz/week    0 Not specified per week     Comment: daily    wine   . Drug Use: No  . Sexual Activity: Not on file   Other Topics Concern  . 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  [place] in [place].    Family History  Problem Relation Age of Onset  . Hypertension    . CAD Neg Hx     ROS-  All systems are reviewed and are negative except as outlined in the HPI above  Physical Exam: Filed Vitals:   12/15/14 1347  BP: 88/60  Pulse: 60  Height: 6\' 3"  (1.905 m)  Weight: 187 lb (84.823 kg)    GEN- The patient is well appearing, alert and oriented x 3 today.   Head- normocephalic, atraumatic Eyes-  Sclera clear, conjunctiva pink Ears- hearing intact Oropharynx- clear Neck- supple, no JVP Lungs- Clear to ausculation bilaterally, normal work of breathing Heart- Regular rate and rhythm, no murmurs, rubs or gallops, PMI not laterally displaced GI- soft, NT, ND, + BS Extremities- no clubbing, cyanosis, or edema MS- no significant deformity or atrophy Skin- no rash or lesion Psych- euthymic mood, full affect Neuro- strength and sensation are intact  ekg today reveals sinus rhythm, IVCD, stable ST/T changes Epic records including discharge summaries are reviewed  Wearable defibrillator interrogation today is reviewed and reveals no arrhythmias, he is compliant with wearing his vest.  Assessment and Plan:  1. Ischemic CM/ CAD The patient continues to make slow recovery s/p recent AMI.  [place].  He had a lifevest placed with anticipation of repeat EF after 3 months of medical therapy.  He has had no arrhythmias detected on his lifevest. He continues on coreg but has not been able to tolerate additional medical therapy due to hypotension. Hopefully if his BP is improved when he sees Dr Excell Seltzerooper, an ace inhibitor can be added. We will repeat an echo after 02/09/15.  IF his  EF <35% at that time, ICD implantation will be discussed.  I will have him follow-up with me at that time.  2. S/p VF arrest See above He should have his lifevest interrogation reviewed when he sees Dr Excell Seltzerooper in February Dennis Bast(Kelly Lanier can assist with this)  3. Anxiety The patient has significant anxiety s/p arrest I have encouraged initiation of cardiac rehab which I think will benefit him in many ways  4. Pericarditis Continues to improve  Return to see me in 3 months  Follow-up with Dr Excell Seltzer as scheduled

## 2014-12-21 ENCOUNTER — Encounter (HOSPITAL_COMMUNITY)
Admission: RE | Admit: 2014-12-21 | Discharge: 2014-12-21 | Disposition: A | Discharge status: Home / Self Care | Payer: Medicare Other | Source: Ambulatory Visit | Attending: Cardiovascular Disease | Admitting: Cardiovascular Disease

## 2014-12-21 DIAGNOSIS — Z955 Presence of coronary angioplasty implant and graft: Secondary | ICD-10-CM | POA: Insufficient documentation

## 2014-12-21 DIAGNOSIS — I252 Old myocardial infarction: Secondary | ICD-10-CM | POA: Insufficient documentation

## 2014-12-21 DIAGNOSIS — I319 Disease of pericardium, unspecified: Secondary | ICD-10-CM | POA: Insufficient documentation

## 2014-12-21 DIAGNOSIS — Z48812 Encounter for surgical aftercare following surgery on the circulatory system: Secondary | ICD-10-CM | POA: Insufficient documentation

## 2014-12-21 DIAGNOSIS — Z79899 Other long term (current) drug therapy: Secondary | ICD-10-CM | POA: Insufficient documentation

## 2014-12-21 DIAGNOSIS — I241 Dressler's syndrome: Secondary | ICD-10-CM | POA: Insufficient documentation

## 2014-12-21 NOTE — Progress Notes (Signed)
Cardiac Rehab Medication Review by a Pharmacist  Does the patient  feel that his/her medications are working for him/her?  yes  Has the patient been experiencing any side effects to the medications prescribed?  no  Does the patient measure his/her own blood pressure or blood glucose at home?  N/A   Does the patient have any problems obtaining medications due to transportation or finances?   no  Understanding of regimen: good Understanding of indications: good Potential of compliance: good   Pharmacist comments: Pt has a good understanding of meds and indications.  His wife helps organize meds and keep him on schedule.  Reviewed proper use of SL NTG and answered all questions.  Kyle Bullock, PharmD Clinical Pharmacy Resident Pager: [phone] 12/21/2014 8:34 AM

## 2014-12-25 ENCOUNTER — Telehealth: Payer: Self-pay | Admitting: Cardiovascular Disease

## 2014-12-25 NOTE — Telephone Encounter (Signed)
Pt states he woke Saturday and didn't feel good, no specific symptoms, a little jittery over the weekend   He didn't feel like doing his usual walk. Pt states Sunday a week ago his weight was 183 lbs, yesterday it was 186 lbs and today it is 185 lbs.  Pt denies lower extremity edema.  Pt has been on prednisone taper and has 2 more days of prednisone. Pt denies diarrhea or abnormal bowel movements, no BRB noted in stool. Pt denies fever and his appetite has been normal.  Pt's SBP has been 96-98, now pt's BP 93/59 heart rate 75, this a little sooner after AM coreg dose than usual when she checks his BP Pt states he is feeling better this morning, may try to find an inside place to walk since it is cold outside today.  I reviewed with Sunday SpillersLori Gerhardt,NP. She recommended pt monitor symptoms and did not recommend any changes at the present time.   Pt advised to continue to monitor BP/HR and weight and to call if any changes in symptoms.   Pt verbalized understanding and agreed with this plan.

## 2014-12-25 NOTE — Telephone Encounter (Signed)
New Msg         Wife calling states pt has gained a few pounds and hasn't been feeling well this weekend.    Please return call to discuss.

## 2014-12-27 ENCOUNTER — Encounter (HOSPITAL_COMMUNITY)
Admission: RE | Admit: 2014-12-27 | Discharge: 2014-12-27 | Disposition: A | Discharge status: Home / Self Care | Payer: Medicare Other | Source: Ambulatory Visit | Attending: Cardiovascular Disease | Admitting: Cardiovascular Disease

## 2014-12-27 ENCOUNTER — Encounter (HOSPITAL_COMMUNITY): Payer: Medicare Other

## 2014-12-27 DIAGNOSIS — Z48812 Encounter for surgical aftercare following surgery on the circulatory system: Secondary | ICD-10-CM | POA: Diagnosis not present

## 2014-12-27 DIAGNOSIS — I241 Dressler's syndrome: Secondary | ICD-10-CM | POA: Diagnosis not present

## 2014-12-27 DIAGNOSIS — I319 Disease of pericardium, unspecified: Secondary | ICD-10-CM | POA: Diagnosis not present

## 2014-12-27 DIAGNOSIS — Z955 Presence of coronary angioplasty implant and graft: Secondary | ICD-10-CM | POA: Diagnosis not present

## 2014-12-27 DIAGNOSIS — Z79899 Other long term (current) drug therapy: Secondary | ICD-10-CM | POA: Diagnosis not present

## 2014-12-27 DIAGNOSIS — I252 Old myocardial infarction: Secondary | ICD-10-CM | POA: Diagnosis not present

## 2014-12-27 NOTE — Progress Notes (Signed)
Pt in today for his first day of exercise in the cardiac rehab phase II program.  Pt tolerated exercise with no complaints.  Monitor showed sr with occasional couplets.  Pt asymptomatic.  Noted EF 20%, pt wears life vest.  Will fax this over to Dr. Excell Seltzerooper for review. Medication list reconciled.  Pt is on his last dose of prednisone therapy for Dressler syndrome.  Pt will continue colchicine for an additional 3 months. Pt verbalizes compliance with his medication regimen and is on his last day of prednisone.  Psychological Assessment PhQ2 score 1. Mainly pt is somewhat frustrated that his activities he did before he is not able to do at this time one being energy level and other being afraid.  Pt and wife both verbalize some anxiety regarding another cardiac event.  Reassured both pt and wife their feelings are perfectly normal and attending rehab will be beneficial to both of them.  Pt fines his wife is a good support and has been helpful to him during his recovery.  Pt feels hopeful regarding his future.  Continue to support pt emotional and periodically do well being check in.   Short term goal is to gain confidence in exercise.  Pt wasn't a regular exerciser but did participate in exercise some. Pt hopes to gain the confidence he needs in order to be comfortable with exercising on his own and plans to do so on a regular basis.  The EP will meet with pt to talk about home exercise and will periodically check in to see how pt is progressing and the consistency of exercise. Long term goal pt desires to find his new normal.  Pt has had reoccurrence of chest pain and multiple readmissions.  Pt is unsure is this what to expect.  Pt has a couple of good days followed by couple of bad days and wonders is this what he should expect.  Reassure pt that his good days will stretch out and be longer in duration.  Pt desires to know when he can do things.  Pt goals are centered around limitation knowledge and expectancy day to  day.  Will reinforce this with pt as needed.  Alanson Alyarlette Talor Cheema RN, BSN

## 2014-12-29 ENCOUNTER — Encounter (HOSPITAL_COMMUNITY): Payer: Medicare Other

## 2015-01-01 ENCOUNTER — Encounter (HOSPITAL_COMMUNITY): Payer: Medicare Other

## 2015-01-03 ENCOUNTER — Telehealth (HOSPITAL_COMMUNITY): Payer: Self-pay | Admitting: *Deleted

## 2015-01-03 ENCOUNTER — Encounter (HOSPITAL_COMMUNITY): Payer: Medicare Other

## 2015-01-03 ENCOUNTER — Encounter (HOSPITAL_COMMUNITY)
Admission: RE | Admit: 2015-01-03 | Discharge: 2015-01-03 | Disposition: A | Discharge status: Home / Self Care | Payer: Medicare Other | Source: Ambulatory Visit | Attending: Cardiovascular Disease | Admitting: Cardiovascular Disease

## 2015-01-03 DIAGNOSIS — I319 Disease of pericardium, unspecified: Secondary | ICD-10-CM | POA: Diagnosis not present

## 2015-01-03 DIAGNOSIS — Z48812 Encounter for surgical aftercare following surgery on the circulatory system: Secondary | ICD-10-CM | POA: Diagnosis not present

## 2015-01-03 DIAGNOSIS — I241 Dressler's syndrome: Secondary | ICD-10-CM | POA: Diagnosis not present

## 2015-01-03 DIAGNOSIS — Z79899 Other long term (current) drug therapy: Secondary | ICD-10-CM | POA: Diagnosis not present

## 2015-01-03 DIAGNOSIS — Z955 Presence of coronary angioplasty implant and graft: Secondary | ICD-10-CM | POA: Diagnosis not present

## 2015-01-03 DIAGNOSIS — I252 Old myocardial infarction: Secondary | ICD-10-CM | POA: Diagnosis not present

## 2015-01-03 NOTE — Telephone Encounter (Signed)
-----   Message from Micheline ChapmanMichael D Cooper, MD sent at 01/02/2015  5:49 AM EST ----- Regarding: RE: Multifocal couplets first day of exercise thx Telford Archambeau. Dr Johney FrameAllred and I have seen him with significant ectopy and he had nonsustained VT during one of his admissions. Plan is to continue current Rx. He is ok to initiate exercise. I'll review strips when I'm in the office - thx, Kathlene NovemberMike ----- Message -----    From: Chelsea Ausarlette B Jani Ploeger, RN    Sent: 12/27/2014   4:33 PM      To: Sharyn BlitzLauren Watford Brown, RN, Micheline ChapmanMichael D Cooper, MD Subject: Multifocal couplets first day of exercise       Dr. Hazle Nordmannooper  FYI I am sending over strips for you to review.  Pt in today for his first day of rehab s/p  12/4 stemi vfib arrest stent to lad.  Readmissions for recurrent chest discomfort determined Dressler syndrome (last day of prednisone today) noted to have multifocal couplets frequent in nature on exertion.  Pt asym,  Noted low ef 20%, has life vest, on beta blocker therapy, last labs K and Mag  WNL.  Advise as needed Adriannah Steinkamp CarltonRN

## 2015-01-05 ENCOUNTER — Ambulatory Visit (INDEPENDENT_AMBULATORY_CARE_PROVIDER_SITE_OTHER): Payer: Medicare Other | Admitting: Nurse Practitioner

## 2015-01-05 ENCOUNTER — Encounter: Payer: Self-pay | Admitting: Nurse Practitioner

## 2015-01-05 ENCOUNTER — Encounter (HOSPITAL_COMMUNITY): Payer: Medicare Other

## 2015-01-05 ENCOUNTER — Encounter (HOSPITAL_COMMUNITY)
Admission: RE | Admit: 2015-01-05 | Discharge: 2015-01-05 | Disposition: A | Discharge status: Home / Self Care | Payer: Medicare Other | Source: Ambulatory Visit | Attending: Cardiovascular Disease | Admitting: Cardiovascular Disease

## 2015-01-05 VITALS — BP 96/64 | HR 59 | Ht 75.0 in | Wt 189.0 lb

## 2015-01-05 DIAGNOSIS — I255 Ischemic cardiomyopathy: Secondary | ICD-10-CM

## 2015-01-05 DIAGNOSIS — E785 Hyperlipidemia, unspecified: Secondary | ICD-10-CM

## 2015-01-05 DIAGNOSIS — Z9861 Coronary angioplasty status: Secondary | ICD-10-CM | POA: Diagnosis not present

## 2015-01-05 DIAGNOSIS — I5022 Chronic systolic (congestive) heart failure: Secondary | ICD-10-CM

## 2015-01-05 DIAGNOSIS — I241 Dressler's syndrome: Secondary | ICD-10-CM

## 2015-01-05 DIAGNOSIS — R0781 Pleurodynia: Secondary | ICD-10-CM | POA: Diagnosis not present

## 2015-01-05 DIAGNOSIS — I10 Essential (primary) hypertension: Secondary | ICD-10-CM | POA: Diagnosis not present

## 2015-01-05 DIAGNOSIS — I2510 Atherosclerotic heart disease of native coronary artery without angina pectoris: Secondary | ICD-10-CM | POA: Diagnosis not present

## 2015-01-05 MED ORDER — OMEPRAZOLE 20 MG PO CPDR: 20.0000 mg | DELAYED_RELEASE_CAPSULE | Freq: Every day | ORAL | Status: DC

## 2015-01-05 MED ORDER — PREDNISONE 10 MG PO TABS: ORAL_TABLET | ORAL | Status: DC

## 2015-01-05 NOTE — Progress Notes (Signed)
Patient Name: Kyle Bullock Date of Encounter: 01/05/2015  Primary Care Provider:  No PCP Per Patient Primary Cardiologist:  M. Excell Seltzer, MD   Chief Complaint  64 year old male status post VF arrest and myocardial infarction in December, complicated by Dressler's syndrome who presents secondary to recurrent pleuritic chest pain.  Past Medical History   Past Medical History  Diagnosis Date  . Hypertension   . Ischemic cardiomyopathy     a. 11/2014: EF reportedly 20% by cath and echo in [place].  . VF (ventricular fibrillation) 11/10/14    a. 11/2014: arrest with STEMI.  Marland Kitchen Myocardial infarction 11/10/2014  . NSVT (nonsustained ventricular tachycardia) 11/16/14    a. 11/2014: admitted after discharge from STEMI admission, 18 beats 135-140bpm in ED.  Marland Kitchen CAD (coronary artery disease)     a. 11/2014: arrest/STEMI s/p Xience DES to the LAD and PTCA to the D1 [place], [place]). b. Relook cath 11/2014: no culprit, patent stent.  Marland Kitchen Dyspnea     a. 11/2014: possibly due to combo of CHF and Brilinta. Brilinta changed to Effient.  . Tobacco abuse   . RBBB   . Dyslipidemia   . Abnormal TSH     a. 11/2014: felt d/t sick euthyroid.  Marland Kitchen Pericarditis   . Sleep apnea     suspected but not diagnosed  . Dressler's syndrome     a. 11/2014: tx with colchicine and prednisone.  . Pericardial effusion     a. 11/2014.  Marland Kitchen Erectile dysfunction     a. Pt aware not to take ED med within 24 hr of NTG and vice versa.   Past Surgical History  Procedure Laterality Date  . Coronary stent placement  11/10/14    LAD DES  . Hand surgery Bilateral   . Knee arthroscopy  1982  . Left heart cath N/A 11/19/2014    Procedure: LEFT HEART CATH;  Surgeon: Runell Gess, MD;  Location: [facility] CATH LAB;  Service: Cardiovascular;  Laterality: N/A;   Allergies  Allergies  Allergen Reactions  . Ambien [Zolpidem Tartrate] Anxiety and Other (See Comments)    Causes nightmares  . Lisinopril Cough  . Brilinta  [Ticagrelor] Cough    ? Dyspnea. Changed to Effient 11/2014.   HPI  64 year old male with the above complex problem list. In December of last year, he suffered an acute anterolateral MI complicated by VF arrest in [place]. He underwent drug-eluting stent placement to the LAD and PTCA of the diagonal. EF was 25% and a life vest was placed prior to discharge. Following return to [place], he developed dyspnea and presented to [facility] where he was readmitted. He was diarrhea stopped and his Brilinta was switched to Effient as it was felt the Brilinta may have been playing a role in his dyspnea. He was also switched from an ACE inhibitor to an ARB. He was seen by electrophysiology with recommendation for ongoing life vest therapy and follow-up echo in 90 days post revascularization. He was then seen in clinic in mid December where he reported pleuritic chest pain. He was readmitted and underwent diagnostic catheterization revealing patency of previously placed stent and area of balloon angioplasty in the diagonal. EF was 20-25% with diffuse hypokinesis and normal pericardial appearance. He did have some hypotension requiring discontinuation of ARB therapy. His beta blocker was also down titrated. He was diagnosed with Dressler's syndrome and was placed on prednisone taper and colchicine. He followed up in the office on December 27, and then again  on January 9 left (electrophysiology) and on both occasions was doing reasonably well. About 10 days ago, as he tapered down to his lowest dose of prednisone, he began to experience mild, 2/10 left and right sided pleuritic chest discomfort occurring only with deep breathing. This was similar to what he experienced in December but not nearly as severe. Since then, he has started cardiac rehabilitation and has been able to ambulate without any significant chest pain or dyspnea. He has continued have mild pleuritic discomfort with deep breathing however. Last  night while lying down he had mild pleuritic discomfort which resolved spontaneously. He reported his symptoms to cardiac rehabilitation nurse today and an office visit was arranged. He is not having any chest pain at rest currently and has not had any exertional chest discomfort. He does have mild pleuritic discomfort with deep breathing here in the office. He denies PND, orthopnea, dizziness, 60, edema, or early satiety. He does weigh himself daily and his weights have been within a 2 pound range.  Home Medications  Prior to Admission medications   Medication Sig Start Date End Date Taking? Authorizing Provider  aspirin 81 MG tablet Take 81 mg by mouth daily. Take one 81 mg tablet by mouth daily   Yes Historical Provider, MD  atorvastatin (LIPITOR) 20 MG tablet Take 1 tablet (20 mg total) by mouth daily. 12/05/14  Yes Dayna N Dunn, PA-C  carvedilol (COREG) 12.5 MG tablet Take 1.5 tablets (18.75 mg total) by mouth 2 (two) times daily with a meal. 11/22/14  Yes Brittainy Sherlynn Carbon, PA-C  colchicine 0.6 MG tablet Take 1 tablet (0.6 mg total) by mouth 2 (two) times daily. 11/27/14  Yes Brittainy Sherlynn Carbon, PA-C  furosemide (LASIX) 40 MG tablet Take 1 tablet (40 mg total) by mouth daily. 11/27/14  Yes Brittainy Sherlynn Carbon, PA-C  nitroGLYCERIN (NITROSTAT) 0.4 MG SL tablet Place 1 tablet (0.4 mg total) under the tongue every 5 (five) minutes as needed for chest pain. 11/24/14  Yes Dayna N Dunn, PA-C  potassium chloride SA (K-DUR,KLOR-CON) 20 MEQ tablet Take 20 mEq by mouth 2 (two) times daily. Take one 20 mg tablet by mouth daily.   Yes Historical Provider, MD  prasugrel (EFFIENT) 10 MG TABS tablet Take 1 tablet (10 mg total) by mouth daily. 11/17/14  Yes Dayna N Dunn, PA-C  omeprazole (PRILOSEC) 20 MG capsule Take 1 capsule (20 mg total) by mouth daily. 01/05/15   Ok Anis, NP  predniSONE (DELTASONE) 10 MG tablet Take 4 tabs daily x 3 days,then take 3 tabs daily x 3 days,then take 2 tabs daily x  3 days, then take 1 tab daily x 3 days, then discontinue 01/05/15   Ok Anis, NP    Review of Systems  As above, he has been expressing pleuritic chest discomfort over the past 7-10 days. This is similar to prior pericarditic symptoms though not nearly as severe. He has been partaking in cardiac rehabilitation without any significant symptoms or limitations.  All other systems reviewed and are otherwise negative except as noted above.  Physical Exam  VS:  BP 96/64 mmHg  Pulse 59  Ht  (1.905 m)  Wt 189 lb (85.73 kg)  BMI 23.62 kg/m2  SpO2 97% , BMI Body mass index is 23.62 kg/(m^2). No pulsus paradoxus. GEN: Well nourished, well developed, in no acute distress. HEENT: normal. Neck: Supple, no JVD, carotid bruits, or masses. Cardiac: RRR, no murmurs, rubs, or gallops. No rub. No clubbing, cyanosis,  edema.  Radials/DP/PT 2+ and equal bilaterally.  Respiratory:  Respirations regular and unlabored, clear to auscultation bilaterally. GI: Soft, nontender, nondistended, BS + x 4. MS: no deformity or atrophy. Skin: warm and dry, no rash. Neuro:  Strength and sensation are intact. Psych: Normal affect.  Accessory Clinical Findings  ECG - sinus bradycardia, 59, incomplete right bundle branch block, less than 1 mm anterolateral ST elevation similar to prior ECGs.  Assessment & Plan  1.  Pleuritic chest pain/Dressler syndrome: Patient status post anterolateral myocardial infarction in December, complicated by pleuritic chest pain and post MI pericarditis requiring initiation of colchicine and prednisone taper in mid December. He did well while on prednisone but has now weaned off and has had some recurrence of mild pleuritic chest discomfort. He also had mild chest discomfort with lying flat last night which resolved with sitting up. He has no evidence of pulsus paradoxus or a rub on exam and is otherwise hemodynamically stable. He has not had any exertional chest discomfort. His  EKG continues to show minimal anterolateral ST elevation that is unchanged from prior ECGs. I suspect he has ongoing post MI pericarditis which overall has improved. He remains on colchicine and I will reinitiate a short course of prednisone to be tapered over the next 12 days. I will resume PPI therapy in that setting. He has fasting lipids and LFTs scheduled for this coming Monday, and I will add a sedimentation rate to those labs. I also questioned what role potential chest wall trauma may be playing in his pleuritic symptoms as he did require CPR for about a minute by his wife's estimate. He does not have any chest wall tenderness and chest x-ray during hospitalization in December did not show any evidence of a rib fracture. He has follow-up with Dr. Excell Seltzerooper in 3 weeks.  2. Coronary artery disease/anterolateral ST segment elevation myocardial infarction, subsequent episode of care: The patient has had pleuritic chest discomfort, he has not had any exertional chest pain or angina. He has been walking regularly without symptoms and has now joined cardiac rehabilitation. Last catheterization in December showed patent LAD and diagonal. He remains on aspirin, statin, beta blocker, and Effient therapy.  3. Ischemic myopathy/chronic systolic just heart failure: He is euvolemic on exam. He remains on beta blocker therapy. He is not on an ACE inhibitor, ARB, ARNI, or spironolactone secondary to relative hypotension with blood pressures in the 90s.  We discussed the importance of daily weights, sodium restriction, medication compliance, and symptom reporting and he verbalizes understanding.   4. Tobacco abuse: Patient quit smoking in December and is committed to not resuming. I congratulated him and recommended continued cessation.  5. Disposition: Follow-up labs on Monday as outlined above. Follow-up with Dr. Excell Seltzerooper as scheduled in 3 weeks.  Nicolasa Duckinghristopher Avereigh Spainhower, NP 01/05/2015, 1:01 PM

## 2015-01-05 NOTE — Progress Notes (Signed)
Patient reports having chest discomfort that lasted from 12 midnight until 0400.  Mr Yetta BarreJones reported having mid sternal discomfort.  Blood pressure 94/60. Patient is pain free currently.  Mr Yetta BarreJones described the pain as mild. [name] rates the pain as a 1-2 on a 1-10 scale. [name] said he was late taking his colchicine last night otherwise. Patient is pain free this morning. Nada BoozerLaura Ingold FNP-C called and notified advised  That patient be seen at the flex clinic to see if mediations may need to be adjusted. Telemetry rhythm sinus 66. Blood pressure 94/60.  [name] has an appointment to see Marvis Represshris Berg NP this morning at 11:30. Patient given today's ECG tracing and today's exercise flow sheet to take to the office. Patient also reports feeling short of breath this morning.  The shortness of breath started yesterday. Oxygen saturation 97% on room air. Lung fields clear upon ascultation. No peripheral edema noted.  [name] did not exercise today.

## 2015-01-05 NOTE — Patient Instructions (Addendum)
Your physician has recommended you make the following change in your medication:  1) START taking Prilosec 20 mg daily 2) START taking Prednisone 67m: Take 4 tabs x 3 days, then take 3 tabs x 3 days, then take 2 tabs x 3 days, then take 1 tab x 3 days, then discontinue.  Your physician recommends that you return for lab work on Monday (ESR)- already scheduled.  Please keep your appointment with Dr. CBurt Knackon 01/24/15 for follow up.

## 2015-01-08 ENCOUNTER — Encounter (HOSPITAL_COMMUNITY)
Admission: RE | Admit: 2015-01-08 | Discharge: 2015-01-08 | Disposition: A | Discharge status: Home / Self Care | Payer: Medicare Other | Source: Ambulatory Visit | Attending: Cardiovascular Disease | Admitting: Cardiovascular Disease

## 2015-01-08 ENCOUNTER — Encounter (HOSPITAL_COMMUNITY): Payer: Medicare Other

## 2015-01-08 ENCOUNTER — Other Ambulatory Visit (INDEPENDENT_AMBULATORY_CARE_PROVIDER_SITE_OTHER): Payer: Medicare Other | Admitting: *Deleted

## 2015-01-08 DIAGNOSIS — I2510 Atherosclerotic heart disease of native coronary artery without angina pectoris: Secondary | ICD-10-CM | POA: Diagnosis not present

## 2015-01-08 DIAGNOSIS — Z48812 Encounter for surgical aftercare following surgery on the circulatory system: Secondary | ICD-10-CM | POA: Diagnosis not present

## 2015-01-08 DIAGNOSIS — I252 Old myocardial infarction: Secondary | ICD-10-CM | POA: Insufficient documentation

## 2015-01-08 DIAGNOSIS — Z9861 Coronary angioplasty status: Secondary | ICD-10-CM | POA: Diagnosis not present

## 2015-01-08 DIAGNOSIS — I319 Disease of pericardium, unspecified: Secondary | ICD-10-CM | POA: Diagnosis not present

## 2015-01-08 DIAGNOSIS — Z955 Presence of coronary angioplasty implant and graft: Secondary | ICD-10-CM | POA: Diagnosis not present

## 2015-01-08 DIAGNOSIS — Z79899 Other long term (current) drug therapy: Secondary | ICD-10-CM | POA: Insufficient documentation

## 2015-01-08 DIAGNOSIS — I241 Dressler's syndrome: Secondary | ICD-10-CM

## 2015-01-08 LAB — HEPATIC FUNCTION PANEL
ALK PHOS: 95 U/L (ref 39–117)
ALT: 24 U/L (ref 0–53)
AST: 13 U/L (ref 0–37)
Albumin: 4.2 g/dL (ref 3.5–5.2)
BILIRUBIN TOTAL: 0.5 mg/dL (ref 0.2–1.2)
Bilirubin, Direct: 0.1 mg/dL (ref 0.0–0.3)
Total Protein: 6.9 g/dL (ref 6.0–8.3)

## 2015-01-08 LAB — LIPID PANEL
CHOL/HDL RATIO: 3
Cholesterol: 108 mg/dL (ref 0–200)
HDL: 39 mg/dL — ABNORMAL LOW (ref >39.00)
LDL CALC: 38 mg/dL (ref 0–99)
NonHDL: 69
Triglycerides: 155 mg/dL — ABNORMAL HIGH (ref 0.0–149.0)
VLDL: 31 mg/dL (ref 0.0–40.0)

## 2015-01-08 LAB — SEDIMENTATION RATE: Sed Rate: 49 mm/hr — ABNORMAL HIGH (ref 0–22)

## 2015-01-08 NOTE — Progress Notes (Signed)
Mr Kyle Bullock returned to exercise today without complaints of chest discomfort. Will continue to monitor the patient throughout  the program.

## 2015-01-09 ENCOUNTER — Telehealth: Payer: Self-pay | Admitting: *Deleted

## 2015-01-09 MED ORDER — POTASSIUM CHLORIDE CRYS ER 20 MEQ PO TBCR: 20.0000 meq | EXTENDED_RELEASE_TABLET | Freq: Every day | ORAL | Status: DC

## 2015-01-09 NOTE — Telephone Encounter (Signed)
s/w pt's wife, DPR on file. I tried to reach pt on home # but no answer or machine. Wife notified of lab results and asked for a new Rx for K+ 20 meq to be sent in to pharm. I advised to limit simple carbs, try to change  to more complex carbs.

## 2015-01-10 ENCOUNTER — Encounter (HOSPITAL_COMMUNITY)
Admission: RE | Admit: 2015-01-10 | Discharge: 2015-01-10 | Disposition: A | Discharge status: Home / Self Care | Payer: Medicare Other | Source: Ambulatory Visit | Attending: Cardiovascular Disease | Admitting: Cardiovascular Disease

## 2015-01-10 ENCOUNTER — Encounter (HOSPITAL_COMMUNITY): Payer: Medicare Other

## 2015-01-10 DIAGNOSIS — I319 Disease of pericardium, unspecified: Secondary | ICD-10-CM | POA: Diagnosis not present

## 2015-01-10 DIAGNOSIS — I241 Dressler's syndrome: Secondary | ICD-10-CM | POA: Diagnosis not present

## 2015-01-10 DIAGNOSIS — Z955 Presence of coronary angioplasty implant and graft: Secondary | ICD-10-CM | POA: Diagnosis not present

## 2015-01-10 DIAGNOSIS — Z48812 Encounter for surgical aftercare following surgery on the circulatory system: Secondary | ICD-10-CM | POA: Diagnosis not present

## 2015-01-10 DIAGNOSIS — Z79899 Other long term (current) drug therapy: Secondary | ICD-10-CM | POA: Diagnosis not present

## 2015-01-10 DIAGNOSIS — I252 Old myocardial infarction: Secondary | ICD-10-CM | POA: Diagnosis not present

## 2015-01-10 NOTE — Progress Notes (Signed)
Reviewed home exercise with pt today.  Pt plans to walk at home and is considering joining a gym for exercise.  Reviewed THR, pulse, RPE, sign and symptoms, NTG use, and when to call 911 or MD.  Pt voiced understanding. Kyle PierceJessica Holly Iannaccone, MA, ACSM RCEP

## 2015-01-12 ENCOUNTER — Encounter (HOSPITAL_COMMUNITY)
Admission: RE | Admit: 2015-01-12 | Discharge: 2015-01-12 | Disposition: A | Discharge status: Home / Self Care | Payer: Medicare Other | Source: Ambulatory Visit | Attending: Cardiovascular Disease | Admitting: Cardiovascular Disease

## 2015-01-12 ENCOUNTER — Encounter (HOSPITAL_COMMUNITY): Payer: Medicare Other

## 2015-01-12 DIAGNOSIS — Z955 Presence of coronary angioplasty implant and graft: Secondary | ICD-10-CM | POA: Diagnosis not present

## 2015-01-12 DIAGNOSIS — Z48812 Encounter for surgical aftercare following surgery on the circulatory system: Secondary | ICD-10-CM | POA: Diagnosis not present

## 2015-01-12 DIAGNOSIS — Z79899 Other long term (current) drug therapy: Secondary | ICD-10-CM | POA: Diagnosis not present

## 2015-01-12 DIAGNOSIS — I241 Dressler's syndrome: Secondary | ICD-10-CM | POA: Diagnosis not present

## 2015-01-12 DIAGNOSIS — I319 Disease of pericardium, unspecified: Secondary | ICD-10-CM | POA: Diagnosis not present

## 2015-01-12 DIAGNOSIS — I252 Old myocardial infarction: Secondary | ICD-10-CM | POA: Diagnosis not present

## 2015-01-15 ENCOUNTER — Encounter (HOSPITAL_COMMUNITY)
Admission: RE | Admit: 2015-01-15 | Discharge: 2015-01-15 | Disposition: A | Discharge status: Home / Self Care | Payer: Medicare Other | Source: Ambulatory Visit | Attending: Cardiovascular Disease | Admitting: Cardiovascular Disease

## 2015-01-15 ENCOUNTER — Other Ambulatory Visit: Payer: BC Managed Care – PPO

## 2015-01-15 ENCOUNTER — Encounter (HOSPITAL_COMMUNITY): Payer: Medicare Other

## 2015-01-15 DIAGNOSIS — Z955 Presence of coronary angioplasty implant and graft: Secondary | ICD-10-CM | POA: Diagnosis not present

## 2015-01-15 DIAGNOSIS — I252 Old myocardial infarction: Secondary | ICD-10-CM | POA: Diagnosis not present

## 2015-01-15 DIAGNOSIS — Z79899 Other long term (current) drug therapy: Secondary | ICD-10-CM | POA: Diagnosis not present

## 2015-01-15 DIAGNOSIS — I319 Disease of pericardium, unspecified: Secondary | ICD-10-CM | POA: Diagnosis not present

## 2015-01-15 DIAGNOSIS — I241 Dressler's syndrome: Secondary | ICD-10-CM | POA: Diagnosis not present

## 2015-01-15 DIAGNOSIS — Z48812 Encounter for surgical aftercare following surgery on the circulatory system: Secondary | ICD-10-CM | POA: Diagnosis not present

## 2015-01-15 NOTE — Progress Notes (Signed)
Reviewed Kyle Bullock's quality of life questionnaire. [name] has low scores in the health and functioning area due to heart disease.  [name] denies being depressed. [name] has not experienced any more chest discomfort since taking Prednisone. Will continue to monitor the patient.

## 2015-01-16 NOTE — Progress Notes (Signed)
Quality of life questionnaire faxed to Dr Earmon Phoenixooper's office for review.

## 2015-01-17 ENCOUNTER — Encounter (HOSPITAL_COMMUNITY)
Admission: RE | Admit: 2015-01-17 | Discharge: 2015-01-17 | Disposition: A | Discharge status: Home / Self Care | Payer: Medicare Other | Source: Ambulatory Visit | Attending: Cardiovascular Disease | Admitting: Cardiovascular Disease

## 2015-01-17 ENCOUNTER — Encounter (HOSPITAL_COMMUNITY): Payer: Medicare Other

## 2015-01-17 DIAGNOSIS — Z48812 Encounter for surgical aftercare following surgery on the circulatory system: Secondary | ICD-10-CM | POA: Diagnosis not present

## 2015-01-17 DIAGNOSIS — I319 Disease of pericardium, unspecified: Secondary | ICD-10-CM | POA: Diagnosis not present

## 2015-01-17 DIAGNOSIS — I241 Dressler's syndrome: Secondary | ICD-10-CM | POA: Diagnosis not present

## 2015-01-17 DIAGNOSIS — Z79899 Other long term (current) drug therapy: Secondary | ICD-10-CM | POA: Diagnosis not present

## 2015-01-17 DIAGNOSIS — Z955 Presence of coronary angioplasty implant and graft: Secondary | ICD-10-CM | POA: Diagnosis not present

## 2015-01-17 DIAGNOSIS — I252 Old myocardial infarction: Secondary | ICD-10-CM | POA: Diagnosis not present

## 2015-01-17 NOTE — Progress Notes (Signed)
Kyle KotykJay finished taking his prednisone today. No complaints of chest pain. Will continue to monitor the patient throughout  the program.

## 2015-01-19 ENCOUNTER — Encounter (HOSPITAL_COMMUNITY)
Admission: RE | Admit: 2015-01-19 | Discharge: 2015-01-19 | Disposition: A | Discharge status: Home / Self Care | Payer: Medicare Other | Source: Ambulatory Visit | Attending: Cardiovascular Disease | Admitting: Cardiovascular Disease

## 2015-01-19 ENCOUNTER — Encounter (HOSPITAL_COMMUNITY): Payer: Medicare Other

## 2015-01-19 DIAGNOSIS — I252 Old myocardial infarction: Secondary | ICD-10-CM | POA: Diagnosis not present

## 2015-01-19 DIAGNOSIS — Z79899 Other long term (current) drug therapy: Secondary | ICD-10-CM | POA: Diagnosis not present

## 2015-01-19 DIAGNOSIS — I241 Dressler's syndrome: Secondary | ICD-10-CM | POA: Diagnosis not present

## 2015-01-19 DIAGNOSIS — Z48812 Encounter for surgical aftercare following surgery on the circulatory system: Secondary | ICD-10-CM | POA: Diagnosis not present

## 2015-01-19 DIAGNOSIS — I319 Disease of pericardium, unspecified: Secondary | ICD-10-CM | POA: Diagnosis not present

## 2015-01-19 DIAGNOSIS — Z955 Presence of coronary angioplasty implant and graft: Secondary | ICD-10-CM | POA: Diagnosis not present

## 2015-01-19 NOTE — Progress Notes (Signed)
Pt hit left hand on arm rest of nustep at cardiac rehab creating a skin tear.    Wound cleaned and pressure applied.  Wound  covered with dry gauze and tegaderm.  Pt instructed to keep wound clean and dry, keeping covered if oozing.  Pt verbalized understanding.

## 2015-01-21 ENCOUNTER — Other Ambulatory Visit: Payer: Self-pay | Admitting: Cardiovascular Disease

## 2015-01-22 ENCOUNTER — Other Ambulatory Visit: Payer: Self-pay

## 2015-01-22 ENCOUNTER — Encounter (HOSPITAL_COMMUNITY)
Admission: RE | Admit: 2015-01-22 | Discharge: 2015-01-22 | Disposition: A | Discharge status: Home / Self Care | Payer: Medicare Other | Source: Ambulatory Visit | Attending: Cardiovascular Disease | Admitting: Cardiovascular Disease

## 2015-01-22 ENCOUNTER — Encounter (HOSPITAL_COMMUNITY): Payer: Medicare Other

## 2015-01-22 DIAGNOSIS — Z79899 Other long term (current) drug therapy: Secondary | ICD-10-CM | POA: Diagnosis not present

## 2015-01-22 DIAGNOSIS — Z48812 Encounter for surgical aftercare following surgery on the circulatory system: Secondary | ICD-10-CM | POA: Diagnosis not present

## 2015-01-22 DIAGNOSIS — I241 Dressler's syndrome: Secondary | ICD-10-CM | POA: Diagnosis not present

## 2015-01-22 DIAGNOSIS — I319 Disease of pericardium, unspecified: Secondary | ICD-10-CM | POA: Diagnosis not present

## 2015-01-22 DIAGNOSIS — I252 Old myocardial infarction: Secondary | ICD-10-CM | POA: Diagnosis not present

## 2015-01-22 DIAGNOSIS — Z955 Presence of coronary angioplasty implant and graft: Secondary | ICD-10-CM | POA: Diagnosis not present

## 2015-01-22 MED ORDER — PRASUGREL HCL 10 MG PO TABS: 10.0000 mg | ORAL_TABLET | Freq: Every day | ORAL | Status: DC

## 2015-01-24 ENCOUNTER — Ambulatory Visit (INDEPENDENT_AMBULATORY_CARE_PROVIDER_SITE_OTHER): Payer: Medicare Other | Admitting: Cardiovascular Disease

## 2015-01-24 ENCOUNTER — Encounter (HOSPITAL_COMMUNITY): Payer: Medicare Other

## 2015-01-24 ENCOUNTER — Encounter: Payer: Self-pay | Admitting: Cardiovascular Disease

## 2015-01-24 ENCOUNTER — Encounter (HOSPITAL_COMMUNITY)
Admission: RE | Admit: 2015-01-24 | Discharge: 2015-01-24 | Disposition: A | Discharge status: Home / Self Care | Payer: Medicare Other | Source: Ambulatory Visit | Attending: Cardiovascular Disease | Admitting: Cardiovascular Disease

## 2015-01-24 VITALS — BP 90/62 | HR 71 | Ht 75.0 in | Wt 193.0 lb

## 2015-01-24 DIAGNOSIS — Z48812 Encounter for surgical aftercare following surgery on the circulatory system: Secondary | ICD-10-CM | POA: Diagnosis not present

## 2015-01-24 DIAGNOSIS — Z79899 Other long term (current) drug therapy: Secondary | ICD-10-CM | POA: Diagnosis not present

## 2015-01-24 DIAGNOSIS — I2510 Atherosclerotic heart disease of native coronary artery without angina pectoris: Secondary | ICD-10-CM | POA: Diagnosis not present

## 2015-01-24 DIAGNOSIS — I241 Dressler's syndrome: Secondary | ICD-10-CM | POA: Diagnosis not present

## 2015-01-24 DIAGNOSIS — I319 Disease of pericardium, unspecified: Secondary | ICD-10-CM | POA: Diagnosis not present

## 2015-01-24 DIAGNOSIS — Z955 Presence of coronary angioplasty implant and graft: Secondary | ICD-10-CM | POA: Diagnosis not present

## 2015-01-24 DIAGNOSIS — I252 Old myocardial infarction: Secondary | ICD-10-CM | POA: Diagnosis not present

## 2015-01-24 MED ORDER — LOSARTAN POTASSIUM 25 MG PO TABS: 12.5000 mg | ORAL_TABLET | Freq: Every day | ORAL | Status: DC

## 2015-01-24 NOTE — Progress Notes (Signed)
Cardiology Office Note   Date:  01/24/2015   ID:  Kyle Bullock, DOB 05-19-1951, MRN [id number]  PCP:  No PCP Per Patient  Cardiologist:  Sherren Mocha, MD    No chief complaint on file.    History of Present Illness: Kyle Bullock is a 64 y.o. male who presents for follow-up of CAD and ischemic cardiomyopathy.   In December 2015, he suffered an acute anterolateral MI complicated by VF arrest in [place]. He underwent drug-eluting stent placement to the LAD and PTCA of the diagonal. EF was 25% and a life vest was placed prior to discharge. Following return to [place], he developed dyspnea and presented to [facility] where he was readmitted. His Brilinta was switched to Effient as it was felt the Brilinta may have been playing a role in his dyspnea. He was also switched from an ACE inhibitor to an ARB. He was seen by electrophysiology with recommendation for ongoing life vest therapy and follow-up echo in 90 days post revascularization. He was then seen in clinic in mid December where he reported pleuritic chest pain. He was readmitted and underwent diagnostic catheterization revealing patency of previously placed stent and area of balloon angioplasty in the diagonal. EF was 20-25% with diffuse hypokinesis and normal pericardial appearance. He did have some hypotension requiring discontinuation of ARB therapy. His beta blocker was also down titrated. He was diagnosed with Dressler's syndrome and was placed on prednisone taper and colchicine.  He is feeling better. Still has episodes of resting dyspnea. No orthopnea, PND, or edema. No further chest pain. Tolerating his medications well. Concerned about periods of breathlessness. Also complains of generalized fatigue. No lightheadedness or syncope.   Past Medical History  Diagnosis Date  . Hypertension   . Ischemic cardiomyopathy     a. 11/2014: EF reportedly 20% by cath and echo in [place].  . VF (ventricular  fibrillation) 11/10/14    a. 11/2014: arrest with STEMI.  Marland Kitchen Myocardial infarction 11/10/2014  . NSVT (nonsustained ventricular tachycardia) 11/16/14    a. 11/2014: admitted after discharge from STEMI admission, 18 beats 135-140bpm in ED.  Marland Kitchen CAD (coronary artery disease)     a. 11/2014: arrest/STEMI s/p Xience DES to the LAD and PTCA to the D1 ([place]). b. Relook cath 11/2014: no culprit, patent stent.  Marland Kitchen Dyspnea     a. 11/2014: possibly due to combo of CHF and Brilinta. Brilinta changed to Effient.  . Tobacco abuse   . RBBB   . Dyslipidemia   . Abnormal TSH     a. 11/2014: felt d/t sick euthyroid.  Marland Kitchen Pericarditis   . Sleep apnea     suspected but not diagnosed  . Dressler's syndrome     a. 11/2014: tx with colchicine and prednisone.  . Pericardial effusion     a. 11/2014.  Marland Kitchen Erectile dysfunction     a. Pt aware not to take ED med within 24 hr of NTG and vice versa.    Past Surgical History  Procedure Laterality Date  . Coronary stent placement  11/10/14    LAD DES  . Hand surgery Bilateral   . Knee arthroscopy  1982  . Left heart cath N/A 11/19/2014    Procedure: LEFT HEART CATH;  Surgeon: Lorretta Harp, MD;  Location: [facility] CATH LAB;  Service: Cardiovascular;  Laterality: N/A;    Current Outpatient Prescriptions  Medication Sig Dispense Refill  . aspirin 81 MG tablet Take 81 mg by mouth daily.  Take one 81 mg tablet by mouth daily    . atorvastatin (LIPITOR) 20 MG tablet Take 1 tablet (20 mg total) by mouth daily.    . carvedilol (COREG) 12.5 MG tablet Take 1.5 tablets (18.75 mg total) by mouth 2 (two) times daily with a meal. 90 tablet 5  . colchicine 0.6 MG tablet Take 1 tablet (0.6 mg total) by mouth 2 (two) times daily. 60 tablet 4  . furosemide (LASIX) 40 MG tablet Take 1 tablet (40 mg total) by mouth daily. 30 tablet 5  . nitroGLYCERIN (NITROSTAT) 0.4 MG SL tablet Place 1 tablet (0.4 mg total) under the tongue every 5 (five) minutes as needed for chest pain.    .  potassium chloride SA (K-DUR,KLOR-CON) 20 MEQ tablet Take 1 tablet (20 mEq total) by mouth daily. 30 tablet 11  . prasugrel (EFFIENT) 10 MG TABS tablet Take 1 tablet (10 mg total) by mouth daily. 90 tablet 1   No current facility-administered medications for this visit.    Allergies:   Ambien; Lisinopril; and Brilinta   Social History:  The patient  reports that he quit smoking about 2 months ago. His smoking use included Cigarettes. He has a 80 pack-year smoking history. He has never used smokeless tobacco. He reports that he drinks alcohol. He reports that he does not use illicit drugs.   Family History:  The patient's  family history includes Hypertension in an other family member. There is no history of CAD.    ROS:  Please see the history of present illness.  Otherwise, review of systems is positive for shortness of breath.  All other systems are reviewed and negative.    PHYSICAL EXAM: VS:  BP 90/62 mmHg  Pulse 71  Ht _0  (1.905 m)  Wt 193 lb (87.544 kg)  BMI 24.12 kg/m2  SpO2 98% , BMI Body mass index is 24.12 kg/(m^2). GEN: Well nourished, well developed, in no acute distress HEENT: normal Neck: no JVD, no masses, no carotid bruits Cardiac: RRR without murmur or gallop. There is no friction rub.                Respiratory:  clear to auscultation bilaterally, normal work of breathing GI: soft, nontender, nondistended, + BS MS: no deformity or atrophy Ext: no pretibial edema Skin: warm and dry, no rash Neuro:  Strength and sensation are intact Psych: euthymic mood, full affect  EKG:  EKG is not ordered today.  Recent Labs: 11/24/2014: Magnesium 2.4; TSH 9.700* 11/26/2014: Hemoglobin 11.0*; Platelets 374 11/27/2014: Pro B Natriuretic peptide (BNP) 9164.0* 12/04/2014: BUN 20; Creatinine 1.02; Potassium 5.0; Sodium 141 01/08/2015: ALT 24   Lipid Panel     Component Value Date/Time   CHOL 108 01/08/2015 0850   TRIG 155.0* 01/08/2015 0850   HDL 39.00* 01/08/2015 0850    HDL 43 12/04/2014 1030   CHOLHDL 3 01/08/2015 0850   CHOLHDL 2.3 12/04/2014 1030   VLDL 31.0 01/08/2015 0850   LDLCALC 38 01/08/2015 0850   LDLCALC 39 12/04/2014 1030      Wt Readings from Last 3 Encounters:  01/24/15 193 lb (87.544 kg)  01/05/15 189 lb (85.73 kg)  12/21/14 189 lb 13.1 oz (86.1 kg)     Cardiac Studies Reviewed: 2D Echo 11/13/2014: Study Conclusions  - Left ventricle: The cavity size was normal. Wall thickness was normal. Systolic function was severely reduced. The estimated ejection fraction was in the range of 20% to 25%. Diastolic dysfunction with elevated LV Filling pressure.  Diffuse hypokinesis. - Mitral valve: Mildly thickened leaflets . There was mild regurgitation. - Left atrium: The atrium was at the upper limits of normal in size. - Right atrium: The atrium was mildly dilated. - Pericardium, extracardiac: Small circumferential pericardial effusion. Tamponade physiology is not suspect, but cannot be ruled-out on the basis of this study.  Impressions:  - Compared to the prior echo in 11/2014, there is now a small circumferential pericardial effusion - no clear tamponade physiology, however, the IVC is dilated. There has been no improvement in LVEF.  ASSESSMENT AND PLAN: 1.  CAD s/p Anterior MI December 2015: no recurrent angina. Cardiac rehab flowsheets reviewed. He will continue on ASA and Effient.  2. Dressler's Syndrome: the patient remains on colchicine and has had 2 short courses of Prednisone. Seems to be improving. Labs reviewed and ESR trending down. Symptoms improved.   3. Ischemic cardiomyopathy: med Rx has been limited because of symptomatic hypotension. The patient has been unable to tolerate ACE/ARB/aldactone. He is only on a low-dose beta-blocker. By review of BP flowsheet, I think he may be able to tolerate Losartan 12.5 mg daily. Will add to his medical regimen.  4. Tobacco abuse: complete smoking cessation  now x 2 months (quit at time of MI).  5. Hyperlipidemia: pt on atorvastatin. Recent lipids as above with LDL 38.  Current medicines are reviewed with the patient today.  The patient does not have concerns regarding medicines.  The following changes have been made:   Add losartan 12.5 mg daily  Labs/ tests ordered today include:  No orders of the defined types were placed in this encounter.   Disposition:   FU 2 months  Signed, Sherren Mocha, MD  01/24/2015 9:44 AM    [facility] [address], [place], [place]  [address] Phone: [phone]; Fax: [phone]

## 2015-01-24 NOTE — Patient Instructions (Signed)
Your physician has recommended you make the following change in your medication:  1. START Losartan 25mg  take one-half tablet by mouth once a day  Your physician recommends that you return for lab work in: 1-2 WEEKS (BMP and BNP)  Your physician recommends that you schedule a follow-up appointment in: 2 MONTHS with Dr Excell Seltzerooper

## 2015-01-26 ENCOUNTER — Encounter (HOSPITAL_COMMUNITY)
Admission: RE | Admit: 2015-01-26 | Discharge: 2015-01-26 | Disposition: A | Discharge status: Home / Self Care | Payer: Medicare Other | Source: Ambulatory Visit | Attending: Cardiovascular Disease | Admitting: Cardiovascular Disease

## 2015-01-26 ENCOUNTER — Encounter (HOSPITAL_COMMUNITY): Payer: Medicare Other

## 2015-01-26 DIAGNOSIS — Z955 Presence of coronary angioplasty implant and graft: Secondary | ICD-10-CM | POA: Diagnosis not present

## 2015-01-26 DIAGNOSIS — I252 Old myocardial infarction: Secondary | ICD-10-CM | POA: Diagnosis not present

## 2015-01-26 DIAGNOSIS — Z79899 Other long term (current) drug therapy: Secondary | ICD-10-CM | POA: Diagnosis not present

## 2015-01-26 DIAGNOSIS — I319 Disease of pericardium, unspecified: Secondary | ICD-10-CM | POA: Diagnosis not present

## 2015-01-26 DIAGNOSIS — I241 Dressler's syndrome: Secondary | ICD-10-CM | POA: Diagnosis not present

## 2015-01-26 DIAGNOSIS — Z48812 Encounter for surgical aftercare following surgery on the circulatory system: Secondary | ICD-10-CM | POA: Diagnosis not present

## 2015-01-29 ENCOUNTER — Ambulatory Visit (INDEPENDENT_AMBULATORY_CARE_PROVIDER_SITE_OTHER): Payer: Medicare Other | Admitting: Nurse Practitioner

## 2015-01-29 ENCOUNTER — Telehealth: Payer: Self-pay | Admitting: Cardiovascular Disease

## 2015-01-29 ENCOUNTER — Telehealth: Payer: Self-pay

## 2015-01-29 ENCOUNTER — Other Ambulatory Visit: Payer: Medicare Other

## 2015-01-29 ENCOUNTER — Encounter (HOSPITAL_COMMUNITY): Payer: Medicare Other

## 2015-01-29 ENCOUNTER — Other Ambulatory Visit: Payer: Self-pay | Admitting: Nurse Practitioner

## 2015-01-29 ENCOUNTER — Other Ambulatory Visit: Payer: Self-pay

## 2015-01-29 ENCOUNTER — Encounter: Payer: Self-pay | Admitting: Nurse Practitioner

## 2015-01-29 ENCOUNTER — Encounter (HOSPITAL_COMMUNITY)
Admission: RE | Admit: 2015-01-29 | Discharge: 2015-01-29 | Disposition: A | Discharge status: Home / Self Care | Payer: Medicare Other | Source: Ambulatory Visit | Attending: Cardiovascular Disease | Admitting: Cardiovascular Disease

## 2015-01-29 ENCOUNTER — Ambulatory Visit
Admission: RE | Admit: 2015-01-29 | Discharge: 2015-01-29 | Disposition: A | Discharge status: Home / Self Care | Payer: Medicare Other | Source: Ambulatory Visit | Attending: Nurse Practitioner | Admitting: Nurse Practitioner

## 2015-01-29 ENCOUNTER — Ambulatory Visit (HOSPITAL_COMMUNITY): Payer: Medicare Other | Attending: Cardiovascular Disease | Admitting: Radiology

## 2015-01-29 VITALS — BP 92/60 | HR 57 | Ht 75.0 in | Wt 193.8 lb

## 2015-01-29 DIAGNOSIS — I255 Ischemic cardiomyopathy: Secondary | ICD-10-CM

## 2015-01-29 DIAGNOSIS — R0781 Pleurodynia: Secondary | ICD-10-CM

## 2015-01-29 DIAGNOSIS — I3139 Other pericardial effusion (noninflammatory): Secondary | ICD-10-CM

## 2015-01-29 DIAGNOSIS — I5022 Chronic systolic (congestive) heart failure: Secondary | ICD-10-CM

## 2015-01-29 DIAGNOSIS — I241 Dressler's syndrome: Secondary | ICD-10-CM | POA: Diagnosis not present

## 2015-01-29 DIAGNOSIS — R0600 Dyspnea, unspecified: Secondary | ICD-10-CM

## 2015-01-29 DIAGNOSIS — I309 Acute pericarditis, unspecified: Secondary | ICD-10-CM | POA: Insufficient documentation

## 2015-01-29 DIAGNOSIS — I313 Pericardial effusion (noninflammatory): Secondary | ICD-10-CM

## 2015-01-29 DIAGNOSIS — K7200 Acute and subacute hepatic failure without coma: Secondary | ICD-10-CM | POA: Diagnosis not present

## 2015-01-29 DIAGNOSIS — I319 Disease of pericardium, unspecified: Secondary | ICD-10-CM

## 2015-01-29 LAB — HEPATIC FUNCTION PANEL
ALT: 47 U/L (ref 0–53)
AST: 24 U/L (ref 0–37)
Albumin: 4.1 g/dL (ref 3.5–5.2)
Alkaline Phosphatase: 110 U/L (ref 39–117)
Bilirubin, Direct: 0.3 mg/dL (ref 0.0–0.3)
Indirect Bilirubin: 0.7 mg/dL (ref 0.2–1.2)
Total Bilirubin: 1 mg/dL (ref 0.2–1.2)
Total Protein: 6.6 g/dL (ref 6.0–8.3)

## 2015-01-29 LAB — CBC
HCT: 36.3 % — ABNORMAL LOW (ref 39.0–52.0)
Hemoglobin: 12.4 g/dL — ABNORMAL LOW (ref 13.0–17.0)
MCHC: 34 g/dL (ref 30.0–36.0)
MCV: 89.9 fl (ref 78.0–100.0)
Platelets: 230 10*3/uL (ref 150.0–400.0)
RBC: 4.04 Mil/uL — ABNORMAL LOW (ref 4.22–5.81)
RDW: 15.6 % — ABNORMAL HIGH (ref 11.5–15.5)
WBC: 6.6 10*3/uL (ref 4.0–10.5)

## 2015-01-29 LAB — BASIC METABOLIC PANEL
BUN: 13 mg/dL (ref 6–23)
CO2: 22 mEq/L (ref 19–32)
Calcium: 9.7 mg/dL (ref 8.4–10.5)
Chloride: 102 mEq/L (ref 96–112)
Creat: 0.98 mg/dL (ref 0.50–1.35)
Glucose, Bld: 103 mg/dL — ABNORMAL HIGH (ref 70–99)
Potassium: 4.3 mEq/L (ref 3.5–5.3)
Sodium: 138 mEq/L (ref 135–145)

## 2015-01-29 LAB — TSH: TSH: 5.75 u[IU]/mL — ABNORMAL HIGH (ref 0.35–4.50)

## 2015-01-29 LAB — BRAIN NATRIURETIC PEPTIDE: Pro B Natriuretic peptide (BNP): 1394 pg/mL — ABNORMAL HIGH (ref 0.0–100.0)

## 2015-01-29 LAB — SEDIMENTATION RATE: Sed Rate: 43 mm/hr — ABNORMAL HIGH (ref 0–22)

## 2015-01-29 MED ORDER — PREDNISONE 5 MG PO TABS: ORAL_TABLET | ORAL | Status: DC

## 2015-01-29 NOTE — Progress Notes (Signed)
Limited Echocardiogram performed for Pericardial Effusion.  

## 2015-01-29 NOTE — Telephone Encounter (Signed)
    Pt contacted with below updated information per Norma FredricksonLori Gerhardt: Discussed with both pt and wife.  Agreed with plan, no questions at this time.  Rx sent to preferred Pharmacy  Talked to Dr. Excell Seltzerooper and reviewed his labs.   Sed rate is 43 and BNP is still up at 1394.   We would like for him to take Lasix 40 mg BID for 3 days   Add back Prednisone 20 mg for 3 days, then 10 mg for 3 days, then 5 mg a day indefinitely   Dr. Excell Seltzerooper wants him seen in CHF clinic with Dr. Gala RomneyBensimhon.

## 2015-01-29 NOTE — Telephone Encounter (Signed)
I spoke with Kyle Bullock at Cardiac rehab. The patient came in to exercise this morning, but was complaining of SOB at rest over the weekend. He has a history of MI with VF arrest in December 2015. He had stent placement at the time. He also had a LifeVest placed for an EF- 20-25%. Today he is complaining of SOB, right rib pain, and his weight is up ~ 4 lbs in 1 week. He is not being allowed to exercise today. Reviewed with Norma FredricksonLori Gerhardt, NP. She will see him at 11:00 AM today. Kyle Bullock at Cardiac rehab aware and will notify the patient.

## 2015-01-29 NOTE — Progress Notes (Addendum)
CARDIOLOGY OFFICE NOTE  Date:  01/29/2015    Kyle Bullock Date of Birth: Sep 11, 1951 Medical Record #[id number]  PCP:  No PCP Per Patient  Cardiologist:  Burt Knack    Chief Complaint  Patient presents with  . Shortness of Breath    Work in visit - seen for Dr. Burt Knack.      History of Present Illness: Kyle Bullock is a 64 y.o. male who presents today for a work in visit. Seen for Dr. Burt Knack. He has CAD and ischemic cardiomyopathy.   In December 2015, he suffered an acute anterolateral MI complicated by VF arrest in [place]. He underwent drug-eluting stent placement to the LAD and PTCA of the diagonal. EF was 25% and a life vest was placed prior to discharge. Following return to [place], he developed dyspnea and presented to [facility] where he was readmitted. His Brilinta was switched to Effient as it was felt the Brilinta may have been playing a role in his dyspnea. He was also switched from an ACE inhibitor to an ARB. He was seen by electrophysiology with recommendation for ongoing life vest therapy and follow-up echo in 90 days post revascularization. He was then seen in clinic in mid December where he reported pleuritic chest pain. He was readmitted and underwent diagnostic catheterization revealing patency of previously placed stent and area of balloon angioplasty in the diagonal. EF was 20-25% with diffuse hypokinesis and normal pericardial appearance. He did have some hypotension requiring discontinuation of ARB therapy. His beta blocker was also down titrated. He was diagnosed with Dressler's syndrome and was placed on prednisone taper and colchicine.  Just seen here last week - was felt to be doing better. Some periods of breathlessness and generalized fatigue. Low dose Losartan added.   Phone call today -  I spoke with JoAnne at Cardiac rehab. The patient came in to exercise this morning, but was complaining of SOB at rest over the weekend. He has a history  of MI with VF arrest in December 2015. He had stent placement at the time. He also had a LifeVest placed for an EF- 20-25%. Today he is complaining of SOB, right rib pain, and his weight is up ~ 4 lbs in 1 week. He is not being allowed to exercise today. Reviewed with Truitt Merle, NP. She will see him at 11:00 AM today. JoAnne at Cardiac rehab aware and will notify the patient.          Thus added to the Flex schedule.  Comes in today. Here with his wife. He was just here 5 days ago - he felt like he was doing ok but admits he was breathless at times - this has just progressed. He is short of breath with and without exertion. He has recurrent right sided pleuritic chest pain - he can point to the spot where it hurts. Does not feel he has been short of breath at night - but wife now sleeping back in the bed with him and notes that he wakes up and is gasping at times. He is a little lightheaded if he gets up too quick. No shocks from his Vest. He has been restricting his salt. Rare cough. No fever or chills. BP now running low. Does not feel bloated. Bowels working ok. Does NOT have PCP.   He is aware of TSH. Patient was not aware of the fibrosis noted on his CT.    Past Medical History  Diagnosis Date  .  Hypertension   . Ischemic cardiomyopathy     a. 11/2014: EF reportedly 20% by cath and echo in [place].  . VF (ventricular fibrillation) 11/10/14    a. 11/2014: arrest with STEMI.  Marland Kitchen Myocardial infarction 11/10/2014  . NSVT (nonsustained ventricular tachycardia) 11/16/14    a. 11/2014: admitted after discharge from STEMI admission, 18 beats 135-140bpm in ED.  Marland Kitchen CAD (coronary artery disease)     a. 11/2014: arrest/STEMI s/p Xience DES to the LAD and PTCA to the D1 ([place]). b. Relook cath 11/2014: no culprit, patent stent.  Marland Kitchen Dyspnea     a. 11/2014: possibly due to combo of CHF and Brilinta. Brilinta changed to Effient.  . Tobacco abuse   . RBBB   . Dyslipidemia   . Abnormal TSH      a. 11/2014: felt d/t sick euthyroid.  Marland Kitchen Pericarditis   . Sleep apnea     suspected but not diagnosed  . Dressler's syndrome     a. 11/2014: tx with colchicine and prednisone.  . Pericardial effusion     a. 11/2014.  Marland Kitchen Erectile dysfunction     a. Pt aware not to take ED med within 24 hr of NTG and vice versa.    Past Surgical History  Procedure Laterality Date  . Coronary stent placement  11/10/14    LAD DES  . Hand surgery Bilateral   . Knee arthroscopy  1982  . Left heart cath N/A 11/19/2014    Procedure: LEFT HEART CATH;  Surgeon: Lorretta Harp, MD;  Location: [facility] CATH LAB;  Service: Cardiovascular;  Laterality: N/A;     Medications: Current Outpatient Prescriptions  Medication Sig Dispense Refill  . aspirin 81 MG tablet Take 81 mg by mouth daily. Take one 81 mg tablet by mouth daily    . atorvastatin (LIPITOR) 20 MG tablet Take 1 tablet (20 mg total) by mouth daily.    . carvedilol (COREG) 12.5 MG tablet Take 1.5 tablets (18.75 mg total) by mouth 2 (two) times daily with a meal. 90 tablet 5  . colchicine 0.6 MG tablet Take 1 tablet (0.6 mg total) by mouth 2 (two) times daily. 60 tablet 4  . furosemide (LASIX) 40 MG tablet Take 1 tablet (40 mg total) by mouth daily. 30 tablet 5  . losartan (COZAAR) 25 MG tablet Take 0.5 tablets (12.5 mg total) by mouth daily. 90 tablet 3  . nitroGLYCERIN (NITROSTAT) 0.4 MG SL tablet Place 1 tablet (0.4 mg total) under the tongue every 5 (five) minutes as needed for chest pain.    . potassium chloride SA (K-DUR,KLOR-CON) 20 MEQ tablet Take 1 tablet (20 mEq total) by mouth daily. 30 tablet 11  . prasugrel (EFFIENT) 10 MG TABS tablet Take 1 tablet (10 mg total) by mouth daily. 90 tablet 1   No current facility-administered medications for this visit.    Allergies: Allergies  Allergen Reactions  . Ambien [Zolpidem Tartrate] Anxiety and Other (See Comments)    Causes nightmares  . Lisinopril Cough  . Brilinta [Ticagrelor] Cough    ?  Dyspnea. Changed to Effient 11/2014.    Social History: The patient  reports that he quit smoking about 2 months ago. His smoking use included Cigarettes. He has a 80 pack-year smoking history. He has never used smokeless tobacco. He reports that he drinks alcohol. He reports that he does not use illicit drugs.   Family History: The patient's family history includes COPD in his mother; Hypertension in his father  and mother. There is no history of CAD.   Review of Systems: Please see the history of present illness.   Otherwise, the review of systems is positive for right sided chest pain and shortness of breath.   All other systems are reviewed and negative.   Physical Exam: VS:  BP 92/60 mmHg  Pulse 57  Ht '6\' 3"'  (1.905 m)  Wt 193 lb 12.8 oz (87.907 kg)  BMI 24.22 kg/m2  SpO2 98% .  BMI Body mass index is 24.22 kg/(m^2).  Wt Readings from Last 3 Encounters:  01/29/15 193 lb 12.8 oz (87.907 kg)  01/24/15 193 lb (87.544 kg)  01/05/15 189 lb (85.73 kg)    General: Pleasant. Well developed, well nourished and in no acute distress.  HEENT: Normal. Neck: Supple, no JVD, carotid bruits, or masses noted.  Cardiac: Regular rate and rhythm. No murmurs, rubs, or gallops. No edema.  Respiratory:  Lungs are clear to auscultation bilaterally with normal work of breathing.  GI: Soft and nontender.  MS: No deformity or atrophy. Gait and ROM intact. Skin: Warm and dry. Color is normal.  Neuro:  Strength and sensation are intact and no gross focal deficits noted.  Psych: Alert, appropriate and with normal affect.   LABORATORY DATA:  EKG:  EKG is ordered today. This demonstrates sinus with PVC and a couplet. Reviewed with Dr. Irish Lack.   Lab Results  Component Value Date   WBC 10.8* 11/26/2014   HGB 11.0* 11/26/2014   HCT 32.8* 11/26/2014   PLT 374 11/26/2014   GLUCOSE 98 12/04/2014   CHOL 108 01/08/2015   TRIG 155.0* 01/08/2015   HDL 39.00* 01/08/2015   LDLCALC 38 01/08/2015   ALT  24 01/08/2015   AST 13 01/08/2015   NA 141 12/04/2014   K 5.0 12/04/2014   CL 100 12/04/2014   CREATININE 1.02 12/04/2014   BUN 20 12/04/2014   CO2 26 12/04/2014   TSH 9.700* 11/24/2014   INR 1.17 11/24/2014    BNP (last 3 results) No results for input(s): BNP in the last 8760 hours.  ProBNP (last 3 results)  Recent Labs  11/24/14 1136 11/26/14 0235 11/27/14 0551  PROBNP 6414.0* 6302.0* 9164.0*     Other Studies Reviewed Today:  2D Echo 12-Dec-2014: Study Conclusions  - Left ventricle: The cavity size was normal. Wall thickness was normal. Systolic function was severely reduced. The estimated ejection fraction was in the range of 20% to 25%. Diastolic dysfunction with elevated LV Filling pressure. Diffuse hypokinesis. - Mitral valve: Mildly thickened leaflets . There was mild regurgitation. - Left atrium: The atrium was at the upper limits of normal in size. - Right atrium: The atrium was mildly dilated. - Pericardium, extracardiac: Small circumferential pericardial effusion. Tamponade physiology is not suspect, but cannot be ruled-out on the basis of this study.  Impressions:  - Compared to the prior echo in 11/2014, there is now a small circumferential pericardial effusion - no clear tamponade physiology, however, the IVC is dilated. There has been no improvement in LVEF.   CT ANGIO OF CHEST IMPRESSION DECEMBER 2015: No demonstrable pulmonary embolus.  Findings felt to represent congestive heart failure superimposed on interstitial fibrosis.  Moderate pericardial effusion. Note that there is narrowing of the right ventricular lumen. Etiology and significance of this finding uncertain. This finding may warrant echocardiography to further evaluate if echocardiography has not been performed recently.  Multiple small lymph nodes but no frank adenopathy.   Electronically Signed  By: Lowella Grip M.D.  On: 11/24/2014  14:52  CARDIAC CATHETERIZATION     History obtained from chart review.Mr. Drost is a 64 year old married Caucasian male presenting to the Cath Lab for urgent angiography because of question of STEMI. He suffered an anterior wall myocardial infarction on 11/10/14 and was treated with stenting of his LAD at [facility]. He was discharged home on 11/15/14 and admitted the next day to [facility] for chest pain. He had witnessed nonsustained ventricular tachycardia and has a known ejection fraction in the 20% range. He was seen by EP who recommended medical therapy and life vest. He was sent home the following day and was admitted today with recurrent chest pain. His troponins continue to 0.77 and his EKG showed no significant changes.   PROCEDURE DESCRIPTION:   The patient was brought to the second floor [facility] cath lab in the postabsorptive state. He was not premedicated . His right wristwas prepped and shaved in usual sterile fashion. Xylocaine 1% was used for local anesthesia. A 6 French sheath was inserted into the right radial artery using standard Seldinger technique. The patient received 4000 units of heparin Intravenously in the emergency room.. A 5 French TIG catheter was used to performed coronary angiography. Left ventriculography was not performed. Visipaque dye was used for the entirety of the case (45 mL and the superficial). Retrograde aortic pressure was monitored during the case.    HEMODYNAMICS:   AO SYSTOLIC/AO DIASTOLIC: 16/10   ANGIOGRAPHIC RESULTS:   1. Left main; normal  2. LAD; the LAD stent was widely patent as was the diagonal branch which arose just proximal to it. There were no significant lesions in the LAD territory 3. Left circumflex; the circumflex was nondominant and was free of significant disease. There was a fairly long ramus branch that had minor irregularities in its proximal segment..  4. Right coronary artery; the right  coronary artery was dominant and had 40-50% segmental stenosis in its midportion. 5. Left ventriculography;not performed  IMPRESSION:Mr. Haven has a widely patent LAD stent. Otherwise his vessels appear patent without evidence of a culprit lesion. His troponins did continue to fall, measured 0.77 in the emergency room. His anterior wall does not appear to be moving on fluoroscopy when performing angiography of his coronary arteries. An alternative etiology of chest pain will need to be pursued such as pericarditis. The sheath was removed and a TR band was placed on the right wrist to achieve patent hemostasis. The patient left the cath lab in stable condition.  Lorretta Harp MD, [facility] 11/19/2014 6:10 AM    ASSESSMENT AND PLAN:  1. CAD s/p Anterior MI December 2015: remains on beta blocker and Effient.   2. Dressler's Syndrome: the patient remains on colchicine and has had 2 short courses of Prednisone. Last ESR trending down. Has now been off of steroids for 2 weeks. Rechecking labs today to include sed rate and BNP  3. Ischemic cardiomyopathy: med Rx has been limited because of symptomatic hypotension. We will hold the losartan for now.   4. Tobacco abuse: Not smoking.   5. Hyperlipidemia: pt on atorvastatin.   6. Fibrosis noted on past CT - patient was informed. He may need to see pulmonary at some point.  7. Dyspnea - with and without activity - I am worried about the past pericardial effusion. This could be heart failure but he does not really look volume overloaded. We will be checking extensive labs. Sending for CXR. May need to repeat a  course of Prednisone.   8. Right sided chest pain - he can point to the exact spot - will send for Rib film and repeat his CXR. Hold on repeat CT for now.   Further disposition to follow. I expect to either increase his diuretics or repeat a course of steroids.   Current medicines are reviewed with the patient today.  The patient does not  have concerns regarding medicines other than what has been noted above.  The following changes have been made:  See above.  Labs/ tests ordered today include:    Orders Placed This Encounter  Procedures  . DG Chest 2 View  . DG Ribs Unilateral Right  . Brain natriuretic peptide  . Basic metabolic panel  . CBC  . Hepatic function panel  . TSH  . Sed Rate (ESR)  . EKG 12-Lead  . 2D Echocardiogram without contrast     Disposition:   Further disposition to follow based on the above studies.   Patient is agreeable to this plan and will call if any problems develop in the interim.   Signed: Burtis Junes, RN, ANP-C 01/29/2015 12:12 PM  [facility] [address] [place], [place]  [address] Phone: [phone] Fax: [phone]      Addendum: Preliminary labs called - Sed rate is 43. BNP remains high at 1394. His repeat TSh is 5.75.  I have talked with Dr. Burt Knack by phone. We will increase his lasix to 40 mg BID for 3 days, adding back Prednisone 78m x 3 days, then 10 mg x 3 days, then 5 mg indefinitely.   Will refer on CHF clinic - Dr. BHaroldine Laws   Still waiting on CXR and rib film results.   LBurtis Junes RN, [place]  [phone]

## 2015-01-29 NOTE — Telephone Encounter (Signed)
New message      Pt c/o Shortness Of Breath: STAT if SOB developed within the last 24 hours or pt is noticeably SOB on the phone  1. Are you currently SOB (can you hear that pt is SOB on the phone)? no  2. How long have you been experiencing SOB? Since Friday---but getting worse  3. Are you SOB when sitting or when up moving around? both 4. Are you currently experiencing any other symptoms? Rt sided pain

## 2015-01-29 NOTE — Progress Notes (Signed)
Pt arrived at cardiac rehab c/o increasing dyspnea over the weekend.  Pt notices dyspnea on exertion and at rest with orthopnea last night.  Pt also c/o right upper quadrant abdominal discomfort that comes and goes sometimes associated with radiation into chest.  Pt denies chest pain currently.  Pt weight 88.4kg which is up from 87.4 over 2 days and up from 86.6 over one week. Pt lungs clear, O2 sat 100%.  BP:  112/70.  Pt does not appear to be in acute distress.  Pt did not exercise.  PC to Dr. Earmon Phoenixooper's office [facility] Triage nurse. Pt worked in to be seen today by Norma FredricksonLori Gerhardt, NP at 11:00am.  Pt given appointment instructions and advised due to work in appointment wait time may be expected. Understanding verbalized.

## 2015-01-29 NOTE — Addendum Note (Signed)
Addended by: Theressa StampsHECKER, Porche Steinberger N on: 01/29/2015 03:38 PM   Modules accepted: Orders

## 2015-01-29 NOTE — Patient Instructions (Addendum)
We will be checking the following labs today BMET, BNP, CBC, sed rate, TSH, LFTs   We will be checking a limited echocardiogram  [facility] on [facility] first [facility] for a chest Xray and rib film - you may walk in.  Stay on your current medicines but hold the Losartan for now - continue to monitor your blood pressure  We will be in touch with you later today about further medicine changes.  Call the [facility] office at [phone] if you have any questions, problems or concerns.

## 2015-01-30 ENCOUNTER — Telehealth: Payer: Self-pay | Admitting: Nurse Practitioner

## 2015-01-30 ENCOUNTER — Telehealth (HOSPITAL_COMMUNITY): Payer: Self-pay | Admitting: *Deleted

## 2015-01-30 NOTE — Telephone Encounter (Signed)
New message      Pt want to ask Kyle BradfordKimberly another question

## 2015-01-31 ENCOUNTER — Encounter (HOSPITAL_COMMUNITY): Payer: Medicare Other

## 2015-01-31 NOTE — Telephone Encounter (Signed)
Left message on pt machine okay to walk as long as feeling okay.  Instructed to make sure have a way to contact home if start to feel bad and if possible not to walk alone.

## 2015-01-31 NOTE — Telephone Encounter (Signed)
Ok as long as he is feeling ok.

## 2015-01-31 NOTE — Telephone Encounter (Signed)
[facility] that he cannot continue until next week.  Pt wants to know if it is okay for him to walk around his neighborhood?  Pt usually walks for 30-40 minutes as long as he is feeling well. Yesterday, he only walked for 15 minutes as he got to tired.  Wants to know if okay to walk and if there is a time limit?

## 2015-02-02 ENCOUNTER — Encounter (HOSPITAL_COMMUNITY): Payer: Medicare Other

## 2015-02-02 ENCOUNTER — Other Ambulatory Visit: Payer: Self-pay

## 2015-02-02 MED ORDER — FUROSEMIDE 40 MG PO TABS: 40.0000 mg | ORAL_TABLET | Freq: Two times a day (BID) | ORAL | Status: DC

## 2015-02-05 ENCOUNTER — Encounter (HOSPITAL_COMMUNITY): Payer: Medicare Other

## 2015-02-07 ENCOUNTER — Other Ambulatory Visit (INDEPENDENT_AMBULATORY_CARE_PROVIDER_SITE_OTHER): Payer: Medicare Other | Admitting: *Deleted

## 2015-02-07 ENCOUNTER — Encounter (HOSPITAL_COMMUNITY): Payer: Medicare Other

## 2015-02-07 DIAGNOSIS — R0600 Dyspnea, unspecified: Secondary | ICD-10-CM | POA: Diagnosis not present

## 2015-02-07 DIAGNOSIS — I2510 Atherosclerotic heart disease of native coronary artery without angina pectoris: Secondary | ICD-10-CM

## 2015-02-07 DIAGNOSIS — I10 Essential (primary) hypertension: Secondary | ICD-10-CM | POA: Diagnosis not present

## 2015-02-07 LAB — BASIC METABOLIC PANEL
BUN: 21 mg/dL (ref 6–23)
CO2: 33 mEq/L — ABNORMAL HIGH (ref 19–32)
Calcium: 9.7 mg/dL (ref 8.4–10.5)
Chloride: 99 mEq/L (ref 96–112)
Creatinine, Ser: 1.16 mg/dL (ref 0.40–1.50)
GFR: 67.14 mL/min (ref >60.00)
Glucose, Bld: 120 mg/dL — ABNORMAL HIGH (ref 70–99)
POTASSIUM: 4.3 meq/L (ref 3.5–5.1)
SODIUM: 137 meq/L (ref 135–145)

## 2015-02-07 LAB — BRAIN NATRIURETIC PEPTIDE: Pro B Natriuretic peptide (BNP): 1442 pg/mL — ABNORMAL HIGH (ref 0.0–100.0)

## 2015-02-09 ENCOUNTER — Encounter (HOSPITAL_COMMUNITY): Payer: Medicare Other

## 2015-02-09 ENCOUNTER — Telehealth: Payer: Self-pay | Admitting: Cardiovascular Disease

## 2015-02-09 NOTE — Telephone Encounter (Signed)
New problem   Pt has questions concerning his labs results from wednesday. Please call pt l

## 2015-02-09 NOTE — Telephone Encounter (Signed)
Called patient back. Patient was not sure what he was suppose to be taking. Consulted Boyce MediciBrittany Simmons PA, she wanted patient to continue taking Lasix 40 mg BID and to hold the Losartan as long as his BP is low, if BP becomes high advise patient to call PCP or our office.  Patient wants to know if he can exercise, like walking in the neighborhood. Sharol HarnessSimmons stated as long as he can tolerate and make sure he has someone with him or a cell phone to call for help. Patient coming in for Echo on Monday, will await for echo for further instructions. Patient verbalized understanding.

## 2015-02-11 ENCOUNTER — Encounter (HOSPITAL_BASED_OUTPATIENT_CLINIC_OR_DEPARTMENT_OTHER): Payer: BC Managed Care – PPO

## 2015-02-12 ENCOUNTER — Encounter (HOSPITAL_COMMUNITY): Payer: Medicare Other

## 2015-02-12 ENCOUNTER — Telehealth: Payer: Self-pay | Admitting: *Deleted

## 2015-02-12 ENCOUNTER — Telehealth (HOSPITAL_COMMUNITY): Payer: Self-pay | Admitting: *Deleted

## 2015-02-12 ENCOUNTER — Ambulatory Visit (HOSPITAL_COMMUNITY): Payer: Medicare Other | Attending: Internal Medicine | Admitting: Radiology

## 2015-02-12 ENCOUNTER — Other Ambulatory Visit (HOSPITAL_COMMUNITY): Payer: Self-pay | Admitting: Internal Medicine

## 2015-02-12 DIAGNOSIS — I255 Ischemic cardiomyopathy: Secondary | ICD-10-CM

## 2015-02-12 DIAGNOSIS — I4510 Unspecified right bundle-branch block: Secondary | ICD-10-CM

## 2015-02-12 DIAGNOSIS — I5022 Chronic systolic (congestive) heart failure: Secondary | ICD-10-CM

## 2015-02-12 DIAGNOSIS — I472 Ventricular tachycardia: Secondary | ICD-10-CM | POA: Diagnosis not present

## 2015-02-12 DIAGNOSIS — I10 Essential (primary) hypertension: Secondary | ICD-10-CM

## 2015-02-12 MED ORDER — SPIRONOLACTONE 25 MG PO TABS
12.5000 mg | ORAL_TABLET | Freq: Every day | ORAL | Status: DC
Start: 2015-02-12 — End: 2015-02-22

## 2015-02-12 NOTE — Progress Notes (Signed)
Limited Echocardiogram performed.  

## 2015-02-12 NOTE — Telephone Encounter (Signed)
Contacted the pt to inform him that his new prescription for Aldacone 12.5 mg po daily was sent to his pharmacy of choice.  Informed the pt to d/c his k-dur.  Informed the pt to come in for a bmet early next week per Dr Excell Seltzerooper.  Scheduled him a lab for 02/19/15 as requested by the pt.  Informed the pt that per Dr Excell Seltzerooper he should keep his appt for advanced HF clinic scheduled for 02/22/15.  Pt verbalized understanding and agrees with this plan.

## 2015-02-12 NOTE — Telephone Encounter (Signed)
-----   Message from Tonny BollmanMichael Cooper, MD sent at 02/12/2015  1:45 PM EST ----- Labs reviewed. Discussed with patient. He is feeling better. Still with episodic dyspnea. BNP remains markedly elevated. Advised the following changes:  STOP K-Dur START Aldactone 12.5 mg daily BMET early next week KEEP appt with Advanced HF Clinic  Please call in Rx for aldactone. thx

## 2015-02-14 ENCOUNTER — Encounter (HOSPITAL_COMMUNITY): Payer: Medicare Other

## 2015-02-14 ENCOUNTER — Encounter (HOSPITAL_COMMUNITY)
Admission: RE | Admit: 2015-02-14 | Discharge: 2015-02-14 | Disposition: A | Discharge status: Home / Self Care | Payer: Medicare Other | Source: Ambulatory Visit | Attending: Cardiovascular Disease | Admitting: Cardiovascular Disease

## 2015-02-14 DIAGNOSIS — I319 Disease of pericardium, unspecified: Secondary | ICD-10-CM | POA: Diagnosis not present

## 2015-02-14 DIAGNOSIS — Z48812 Encounter for surgical aftercare following surgery on the circulatory system: Secondary | ICD-10-CM | POA: Diagnosis not present

## 2015-02-14 DIAGNOSIS — Z79899 Other long term (current) drug therapy: Secondary | ICD-10-CM | POA: Insufficient documentation

## 2015-02-14 DIAGNOSIS — Z955 Presence of coronary angioplasty implant and graft: Secondary | ICD-10-CM | POA: Diagnosis not present

## 2015-02-14 DIAGNOSIS — I252 Old myocardial infarction: Secondary | ICD-10-CM | POA: Diagnosis not present

## 2015-02-14 DIAGNOSIS — I241 Dressler's syndrome: Secondary | ICD-10-CM | POA: Insufficient documentation

## 2015-02-14 NOTE — Telephone Encounter (Signed)
-----   Message from Iona CoachLauren W Brown, RN sent at 02/14/2015  9:36 AM EST ----- Regarding: RE: cardiac rehab Per Dr Excell Seltzerooper the pt is okay to participate in cardiac rehab today!!! ----- Message -----    From: Cammy CopaMaria W Whitaker, RN    Sent: 02/14/2015   9:13 AM      To: Iona CoachLauren W Brown, RN Subject: cardiac rehab                                  Good morning Lauren,  Mr Yetta BarreJones wants to return to exercise this morning. I sent an in basket to Dr Excell Seltzerooper yesterday. Would you be able to ask him if that is okay for me please?   Thanks for your help as always.   Byrd HesselbachMaria

## 2015-02-15 NOTE — Telephone Encounter (Signed)
Todd Cline,  Patient should have been seen a few months ago.  Could you call him and make an appt ASAP to see Dr. Marval Regalavindra (New patient to office)?  Thanks,  Drinda Buttsnnette CMA

## 2015-02-16 ENCOUNTER — Encounter (HOSPITAL_COMMUNITY): Payer: Medicare Other

## 2015-02-16 ENCOUNTER — Encounter (HOSPITAL_COMMUNITY)
Admission: RE | Admit: 2015-02-16 | Discharge: 2015-02-16 | Disposition: A | Discharge status: Home / Self Care | Payer: Medicare Other | Source: Ambulatory Visit | Attending: Cardiovascular Disease | Admitting: Cardiovascular Disease

## 2015-02-16 DIAGNOSIS — Z79899 Other long term (current) drug therapy: Secondary | ICD-10-CM | POA: Diagnosis not present

## 2015-02-16 DIAGNOSIS — I252 Old myocardial infarction: Secondary | ICD-10-CM | POA: Diagnosis not present

## 2015-02-16 DIAGNOSIS — Z955 Presence of coronary angioplasty implant and graft: Secondary | ICD-10-CM | POA: Diagnosis not present

## 2015-02-16 DIAGNOSIS — I241 Dressler's syndrome: Secondary | ICD-10-CM | POA: Diagnosis not present

## 2015-02-16 DIAGNOSIS — Z48812 Encounter for surgical aftercare following surgery on the circulatory system: Secondary | ICD-10-CM | POA: Diagnosis not present

## 2015-02-16 DIAGNOSIS — I319 Disease of pericardium, unspecified: Secondary | ICD-10-CM | POA: Diagnosis not present

## 2015-02-19 ENCOUNTER — Other Ambulatory Visit (INDEPENDENT_AMBULATORY_CARE_PROVIDER_SITE_OTHER): Payer: Medicare Other | Admitting: *Deleted

## 2015-02-19 ENCOUNTER — Encounter (HOSPITAL_COMMUNITY): Payer: Medicare Other

## 2015-02-19 ENCOUNTER — Encounter (HOSPITAL_COMMUNITY)
Admission: RE | Admit: 2015-02-19 | Discharge: 2015-02-19 | Disposition: A | Discharge status: Home / Self Care | Payer: Medicare Other | Source: Ambulatory Visit | Attending: Cardiovascular Disease | Admitting: Cardiovascular Disease

## 2015-02-19 DIAGNOSIS — I5022 Chronic systolic (congestive) heart failure: Secondary | ICD-10-CM | POA: Diagnosis not present

## 2015-02-19 DIAGNOSIS — I4510 Unspecified right bundle-branch block: Secondary | ICD-10-CM | POA: Diagnosis not present

## 2015-02-19 DIAGNOSIS — Z48812 Encounter for surgical aftercare following surgery on the circulatory system: Secondary | ICD-10-CM | POA: Diagnosis not present

## 2015-02-19 DIAGNOSIS — I241 Dressler's syndrome: Secondary | ICD-10-CM | POA: Diagnosis not present

## 2015-02-19 DIAGNOSIS — I319 Disease of pericardium, unspecified: Secondary | ICD-10-CM | POA: Diagnosis not present

## 2015-02-19 DIAGNOSIS — I10 Essential (primary) hypertension: Secondary | ICD-10-CM

## 2015-02-19 DIAGNOSIS — I255 Ischemic cardiomyopathy: Secondary | ICD-10-CM

## 2015-02-19 DIAGNOSIS — I252 Old myocardial infarction: Secondary | ICD-10-CM | POA: Diagnosis not present

## 2015-02-19 DIAGNOSIS — Z79899 Other long term (current) drug therapy: Secondary | ICD-10-CM | POA: Diagnosis not present

## 2015-02-19 DIAGNOSIS — Z955 Presence of coronary angioplasty implant and graft: Secondary | ICD-10-CM | POA: Diagnosis not present

## 2015-02-19 LAB — BASIC METABOLIC PANEL
BUN: 23 mg/dL (ref 6–23)
CO2: 31 mEq/L (ref 19–32)
Calcium: 9.4 mg/dL (ref 8.4–10.5)
Chloride: 99 mEq/L (ref 96–112)
Creatinine, Ser: 1.21 mg/dL (ref 0.40–1.50)
GFR: 63.94 mL/min (ref >60.00)
Glucose, Bld: 110 mg/dL — ABNORMAL HIGH (ref 70–99)
POTASSIUM: 4 meq/L (ref 3.5–5.1)
SODIUM: 135 meq/L (ref 135–145)

## 2015-02-19 NOTE — Progress Notes (Signed)
Kyle Bullock 64 y.o. male Nutrition Note Spoke with pt.  Nutrition Plan and Nutrition Survey goals reviewed with pt. Pt is following Step 2 of the Therapeutic Lifestyle Changes diet. Pt has CHF and is watching his sodium intake closely "with my wife's help." Pt seasoning food with Mrs.Dash/Pepper. Pt expressed understanding of the information reviewed. Pt aware of nutrition education classes offered and plans on attending nutrition classes.  Nutrition Diagnosis ? Food-and nutrition-related knowledge deficit related to lack of exposure to information as related to diagnosis of: ? CVD, CHF  Nutrition RX/ Estimated Daily Nutrition Needs for: wt  maintenance  2400-2800 Kcal, 80-90 gm fat, 16-18 gm sat fat, 2.4-2.8 gm trans-fat, <1500 mg sodium  Nutrition Intervention ? Pt's individual nutrition plan reviewed with pt. ? Benefits of adopting Therapeutic Lifestyle Changes discussed when Medficts reviewed. ? Pt to attend the Portion Distortion class - met; 01/10/15 ? Pt to attend the   ? Nutrition I class - met; 01/16/15                 ? Nutrition II class ?  Pt given handouts for: ? Nutrition II class  ? Continue client-centered nutrition education by RD, as part of interdisciplinary care. Goal(s) ? Pt to describe the benefit of including fruits, vegetables, whole grains, and low-fat dairy products in a heart healthy meal plan. Monitor and Evaluate progress toward nutrition goal with team. Nutrition Risk: Change to Moderate [name], M.Ed, RD, LDN, CDE 02/19/2015 11:25 AM

## 2015-02-19 NOTE — Telephone Encounter (Signed)
Noted, thanks.

## 2015-02-19 NOTE — Telephone Encounter (Signed)
Elonda HuskyKaren S Robertson   Jnae Thomaston N Khrista Braun (You)   22 minutes ago   (11:43 AM)    Drinda ButtsAnnette,    Phone # has been disconnected. Mailed post card.     Thanks  Clydie BraunKaren Acupuncturist(Routing comment)

## 2015-02-21 ENCOUNTER — Encounter (HOSPITAL_COMMUNITY): Payer: Medicare Other

## 2015-02-21 ENCOUNTER — Encounter (HOSPITAL_COMMUNITY)
Admission: RE | Admit: 2015-02-21 | Discharge: 2015-02-21 | Disposition: A | Discharge status: Home / Self Care | Payer: Medicare Other | Source: Ambulatory Visit | Attending: Cardiovascular Disease | Admitting: Cardiovascular Disease

## 2015-02-21 DIAGNOSIS — I319 Disease of pericardium, unspecified: Secondary | ICD-10-CM | POA: Diagnosis not present

## 2015-02-21 DIAGNOSIS — Z48812 Encounter for surgical aftercare following surgery on the circulatory system: Secondary | ICD-10-CM | POA: Diagnosis not present

## 2015-02-21 DIAGNOSIS — I241 Dressler's syndrome: Secondary | ICD-10-CM | POA: Diagnosis not present

## 2015-02-21 DIAGNOSIS — Z955 Presence of coronary angioplasty implant and graft: Secondary | ICD-10-CM | POA: Diagnosis not present

## 2015-02-21 DIAGNOSIS — Z79899 Other long term (current) drug therapy: Secondary | ICD-10-CM | POA: Diagnosis not present

## 2015-02-21 DIAGNOSIS — I252 Old myocardial infarction: Secondary | ICD-10-CM | POA: Diagnosis not present

## 2015-02-22 ENCOUNTER — Other Ambulatory Visit: Payer: Self-pay

## 2015-02-22 ENCOUNTER — Encounter (HOSPITAL_COMMUNITY): Payer: Self-pay

## 2015-02-22 ENCOUNTER — Ambulatory Visit (HOSPITAL_COMMUNITY)
Admission: RE | Admit: 2015-02-22 | Discharge: 2015-02-22 | Disposition: A | Discharge status: Home / Self Care | Payer: Medicare Other | Source: Ambulatory Visit | Attending: Internal Medicine | Admitting: Internal Medicine

## 2015-02-22 VITALS — BP 116/61 | HR 63 | Resp 18 | Wt 189.8 lb

## 2015-02-22 DIAGNOSIS — I4510 Unspecified right bundle-branch block: Secondary | ICD-10-CM | POA: Diagnosis not present

## 2015-02-22 DIAGNOSIS — I241 Dressler's syndrome: Secondary | ICD-10-CM | POA: Diagnosis not present

## 2015-02-22 DIAGNOSIS — I10 Essential (primary) hypertension: Secondary | ICD-10-CM | POA: Diagnosis not present

## 2015-02-22 DIAGNOSIS — E785 Hyperlipidemia, unspecified: Secondary | ICD-10-CM | POA: Diagnosis not present

## 2015-02-22 DIAGNOSIS — I2510 Atherosclerotic heart disease of native coronary artery without angina pectoris: Secondary | ICD-10-CM | POA: Insufficient documentation

## 2015-02-22 DIAGNOSIS — I255 Ischemic cardiomyopathy: Secondary | ICD-10-CM | POA: Insufficient documentation

## 2015-02-22 DIAGNOSIS — Z955 Presence of coronary angioplasty implant and graft: Secondary | ICD-10-CM | POA: Insufficient documentation

## 2015-02-22 DIAGNOSIS — I5022 Chronic systolic (congestive) heart failure: Secondary | ICD-10-CM | POA: Diagnosis not present

## 2015-02-22 DIAGNOSIS — Z7982 Long term (current) use of aspirin: Secondary | ICD-10-CM | POA: Diagnosis not present

## 2015-02-22 DIAGNOSIS — Z8249 Family history of ischemic heart disease and other diseases of the circulatory system: Secondary | ICD-10-CM | POA: Insufficient documentation

## 2015-02-22 DIAGNOSIS — Z87891 Personal history of nicotine dependence: Secondary | ICD-10-CM | POA: Insufficient documentation

## 2015-02-22 DIAGNOSIS — Z825 Family history of asthma and other chronic lower respiratory diseases: Secondary | ICD-10-CM | POA: Diagnosis not present

## 2015-02-22 DIAGNOSIS — Z7952 Long term (current) use of systemic steroids: Secondary | ICD-10-CM | POA: Diagnosis not present

## 2015-02-22 DIAGNOSIS — I252 Old myocardial infarction: Secondary | ICD-10-CM | POA: Insufficient documentation

## 2015-02-22 MED ORDER — SPIRONOLACTONE 25 MG PO TABS: 25.0000 mg | ORAL_TABLET | Freq: Every day | ORAL | Status: DC

## 2015-02-22 MED ORDER — FUROSEMIDE 40 MG PO TABS: 40.0000 mg | ORAL_TABLET | Freq: Every day | ORAL | Status: DC

## 2015-02-22 NOTE — Progress Notes (Signed)
Advanced HF Clinic Note   History of Present Illness: Kyle Bullock is a 64 y.o. male who is referred to the HF Clinic by Dr. Excell Seltzer. He has h/o CAD and ischemic cardiomyopathy 20-25%.   In December 2015, he suffered an acute anterolateral MI complicated by VF arrest in [place]. He underwent drug-eluting stent placement to the LAD and PTCA of the diagonal. EF was 25% and a life vest was placed prior to discharge. Following return to [place], he developed dyspnea and presented to [facility] where he was readmitted. His Brilinta was switched to Effient as it was felt the Brilinta may have been playing a role in his dyspnea. He was also switched from an ACE inhibitor to an ARB. He was seen by electrophysiology with recommendation for ongoing life vest therapy and follow-up echo in 90 days post revascularization. He was then seen in clinic in mid December where he reported pleuritic chest pain. He was readmitted and underwent diagnostic catheterization revealing patency of previously placed stent and area of balloon angioplasty in the diagonal. EF was 20-25% with diffuse hypokinesis and normal pericardial appearance. He did have some hypotension requiring discontinuation of ARB therapy. His beta blocker was also down titrated. He was diagnosed with Dressler's syndrome and was placed on prednisone taper and colchicine.  Remains on prednisone  daily. He saw Dr. Excell Seltzer last on 2/17 and losartan added. However saw Norma Fredrickson on 01/29/15 for dyspnea and SBP 92 so losartan held again. Started CR last week.  He is feeling better. Spent 10 mins each on bike, TM and stepper. Some shortness of breath but better than before. Walked on TM at 3.0 mph. No chest pain. Weight stable at 185-186. Was on lasix 40 daily about 2-3 weeks ago increased to 40 bid. Wife very good about helping him watch salt. No orthopnea, PND, or edema. Gets dizzy if he gets up quickly on occasion.    Past Medical History    Diagnosis Date  . Hypertension   . Ischemic cardiomyopathy     a. 11/2014: EF reportedly 20% by cath and echo in [place].  . VF (ventricular fibrillation) 11/10/14    a. 11/2014: arrest with STEMI.  Marland Kitchen Myocardial infarction 11/10/2014  . NSVT (nonsustained ventricular tachycardia) 11/16/14    a. 11/2014: admitted after discharge from STEMI admission, 18 beats 135-140bpm in ED.  Marland Kitchen CAD (coronary artery disease)     a. 11/2014: arrest/STEMI s/p Xience DES to the LAD and PTCA to the D1 [place], [place]). b. Relook cath 11/2014: no culprit, patent stent.  Marland Kitchen Dyspnea     a. 11/2014: possibly due to combo of CHF and Brilinta. Brilinta changed to Effient.  . Tobacco abuse   . RBBB   . Dyslipidemia   . Abnormal TSH     a. 11/2014: felt d/t sick euthyroid.  Marland Kitchen Pericarditis   . Sleep apnea     suspected but not diagnosed  . Dressler's syndrome     a. 11/2014: tx with colchicine and prednisone.  . Pericardial effusion     a. 11/2014.  Marland Kitchen Erectile dysfunction     a. Pt aware not to take ED med within 24 hr of NTG and vice versa.    Past Surgical History  Procedure Laterality Date  . Coronary stent placement  11/10/14    LAD DES  . Hand surgery Bilateral   . Knee arthroscopy  1982  . Left heart cath N/A 11/19/2014    Procedure: LEFT HEART  CATH;  Surgeon: Runell GessJonathan J Berry, MD;  [facility];  Service: Cardiovascular;  Laterality: N/A;    Current Outpatient Prescriptions  Medication Sig Dispense Refill  . aspirin 81 MG tablet Take 81 mg by mouth daily. Take one 81 mg tablet by mouth daily    . atorvastatin (LIPITOR) 20 MG tablet Take 1 tablet (20 mg total) by mouth daily.    . carvedilol (COREG) 12.5 MG tablet Take 1.5 tablets (18.75 mg total) by mouth 2 (two) times daily with a meal. 90 tablet 5  . colchicine 0.6 MG tablet Take 1 tablet (0.6 mg total) by mouth 2 (two) times daily. 60 tablet 4  . furosemide (LASIX) 40 MG tablet Take 1 tablet (40 mg total) by mouth 2 (two) times  daily. 60 tablet 5  . nitroGLYCERIN (NITROSTAT) 0.4 MG SL tablet Place 1 tablet (0.4 mg total) under the tongue every 5 (five) minutes as needed for chest pain.    . prasugrel (EFFIENT) 10 MG TABS tablet Take 1 tablet (10 mg total) by mouth daily. 90 tablet 1  . predniSONE (DELTASONE) 5 MG tablet Take 20 mg daily for 3 days; Take 10 mg daily for 3 days; Take 5 mg daily 90 tablet 3  . spironolactone (ALDACTONE) 25 MG tablet Take 0.5 tablets (12.5 mg total) by mouth daily. 90 tablet 3   No current facility-administered medications for this encounter.    Allergies:   Ambien; Lisinopril; and Brilinta   Social History:  The patient  reports that he quit smoking about 3 months ago. His smoking use included Cigarettes. He has a 80 pack-year smoking history. He has never used smokeless tobacco. He reports that he drinks alcohol. He reports that he does not use illicit drugs.   Family History:  The patient's  family history includes COPD in his mother; Hypertension in his father and mother. There is no history of CAD.    ROS:  Please see the history of present illness.  Otherwise, review of systems is positive for shortness of breath.  All other systems are reviewed and negative.    PHYSICAL EXAM: VS:  BP 116/61 mmHg  Pulse 63  Resp 18  Wt 189 lb 12 oz (86.07 kg)  SpO2 98% , BMI Body mass index is 23.72 kg/(m^2). GEN: Well nourished, well developed, in no acute distress HEENT: normal Neck: no JVD, no masses, no carotid bruits Cardiac: RRR without murmur or gallop. There is no friction rub.                Respiratory:  clear to auscultation bilaterally, normal work of breathing GI: soft, nontender, nondistended, + BS MS: no deformity or atrophy Ext: no pretibial edema Skin: warm and dry, no rash Neuro:  Strength and sensation are intact Psych: euthymic mood, full affect   Recent Labs: 11/24/2014: Magnesium 2.4 01/29/2015: ALT 47; Hemoglobin 12.4*; Platelets 230.0; TSH 5.75* 02/07/2015:  Pro B Natriuretic peptide (BNP) 1442.0* 02/19/2015: BUN 23; Creatinine 1.21; Potassium 4.0; Sodium 135   Lipid Panel     Component Value Date/Time   CHOL 108 01/08/2015 0850   CHOL 101 12/04/2014 1030   TRIG 155.0* 01/08/2015 0850   HDL 39.00* 01/08/2015 0850   HDL 43 12/04/2014 1030   CHOLHDL 3 01/08/2015 0850   CHOLHDL 2.3 12/04/2014 1030   VLDL 31.0 01/08/2015 0850   LDLCALC 38 01/08/2015 0850   LDLCALC 39 12/04/2014 1030      Wt Readings from Last 3 Encounters:  01/29/15  193 lb 12.8 oz (87.907 kg)  01/24/15 193 lb (87.544 kg)  01/05/15 189 lb (85.73 kg)     Cardiac Studies Reviewed: 2D Echo 11/13/2014: Study Conclusions  - Left ventricle: The cavity size was normal. Wall thickness was normal. Systolic function was severely reduced. The estimated ejection fraction was in the range of 20% to 25%. Diastolic dysfunction with elevated LV Filling pressure. Diffuse hypokinesis. - Mitral valve: Mildly thickened leaflets . There was mild regurgitation. - Left atrium: The atrium was at the upper limits of normal in size. - Right atrium: The atrium was mildly dilated. - Pericardium, extracardiac: Small circumferential pericardial effusion. Tamponade physiology is not suspect, but cannot be ruled-out on the basis of this study.  Impressions:  - Compared to the prior echo in 11/2014, there is now a small circumferential pericardial effusion - no clear tamponade physiology, however, the IVC is dilated. There has been no improvement in LVEF.  ASSESSMENT AND PLAN:  1. Chronic systolic due to ischemic CM - HF EF 25%. med Rx has been limited because of symptomatic hypotension. --Currently NYHA II-III. Improving slowly --Volume status looks good. --Unable to tolerate ACE/ARB due to hypotension. --Continue low-dose b-blocker --Increase spiro to 25 daily --Cut lasix to  daily. Reinforced need for daily weights and reviewed use of sliding scale  diuretics. --Long talk about natural history of HF and role/timing of advanced therapies. Will get CPX test to objectively assess his situation. Suspect he is too early.  --Continue cardia rehab. --Will need repeat echo or cMRI at next visit to reassess EF and decide on ICD. Continue LifeVest --At next visit may consider cutting carvedilol to 12.5 bid and starting losartan 12.5. We will see HR response on CPX  2.  CAD s/p Anterior MI December 2015:  n--o recurrent angina. Cardiac rehab flowsheets reviewed. He will continue on ASA, statin and Effient per Dr. Excell Seltzer  3. Dressler's syndrome --Resolved. Stop prednisone. Continue colchicine x 6 months  4. Tobacco abuse:  --complete smoking cessation now x 3 months (quit at time of MI).  Total time spent 45 minutes. Over half that time spent discussing above.   Arvilla Meres, MD  02/22/2015 10:40 AM

## 2015-02-22 NOTE — Patient Instructions (Addendum)
STOP Prednisone.  INCREASE Spironolactone to 25mg   (1 tablet daily)  DECREASE Lasix to 40 mg (1 tablet daily)  Your provider has requested you have a Cardiopulmonary Exercise test. ( see patient instructions)  LABS in 1 week. (bmet)

## 2015-02-23 ENCOUNTER — Encounter (HOSPITAL_COMMUNITY): Payer: Medicare Other

## 2015-02-26 ENCOUNTER — Telehealth: Payer: Self-pay | Admitting: Internal Medicine

## 2015-02-26 ENCOUNTER — Encounter (HOSPITAL_COMMUNITY)
Admission: RE | Admit: 2015-02-26 | Discharge: 2015-02-26 | Disposition: A | Discharge status: Home / Self Care | Payer: Medicare Other | Source: Ambulatory Visit | Attending: Cardiovascular Disease | Admitting: Cardiovascular Disease

## 2015-02-26 ENCOUNTER — Encounter (HOSPITAL_COMMUNITY): Payer: Medicare Other

## 2015-02-26 ENCOUNTER — Ambulatory Visit (INDEPENDENT_AMBULATORY_CARE_PROVIDER_SITE_OTHER): Payer: Medicare Other | Admitting: Internal Medicine

## 2015-02-26 ENCOUNTER — Encounter: Payer: Self-pay | Admitting: Internal Medicine

## 2015-02-26 VITALS — BP 96/60 | HR 64 | Ht 75.0 in | Wt 191.6 lb

## 2015-02-26 DIAGNOSIS — Z48812 Encounter for surgical aftercare following surgery on the circulatory system: Secondary | ICD-10-CM | POA: Diagnosis not present

## 2015-02-26 DIAGNOSIS — I241 Dressler's syndrome: Secondary | ICD-10-CM | POA: Diagnosis not present

## 2015-02-26 DIAGNOSIS — I252 Old myocardial infarction: Secondary | ICD-10-CM | POA: Diagnosis not present

## 2015-02-26 DIAGNOSIS — I5022 Chronic systolic (congestive) heart failure: Secondary | ICD-10-CM | POA: Diagnosis not present

## 2015-02-26 DIAGNOSIS — Z79899 Other long term (current) drug therapy: Secondary | ICD-10-CM | POA: Diagnosis not present

## 2015-02-26 DIAGNOSIS — I255 Ischemic cardiomyopathy: Secondary | ICD-10-CM

## 2015-02-26 DIAGNOSIS — I319 Disease of pericardium, unspecified: Secondary | ICD-10-CM | POA: Diagnosis not present

## 2015-02-26 DIAGNOSIS — I10 Essential (primary) hypertension: Secondary | ICD-10-CM

## 2015-02-26 DIAGNOSIS — I4510 Unspecified right bundle-branch block: Secondary | ICD-10-CM

## 2015-02-26 DIAGNOSIS — Z955 Presence of coronary angioplasty implant and graft: Secondary | ICD-10-CM | POA: Diagnosis not present

## 2015-02-26 LAB — BASIC METABOLIC PANEL
BUN: 23 mg/dL (ref 6–23)
CO2: 27 mEq/L (ref 19–32)
CREATININE: 1.13 mg/dL (ref 0.40–1.50)
Calcium: 9.6 mg/dL (ref 8.4–10.5)
Chloride: 103 mEq/L (ref 96–112)
GFR: 69.19 mL/min (ref >60.00)
Glucose, Bld: 102 mg/dL — ABNORMAL HIGH (ref 70–99)
Potassium: 4.5 mEq/L (ref 3.5–5.1)
SODIUM: 139 meq/L (ref 135–145)

## 2015-02-26 LAB — CBC WITH DIFFERENTIAL/PLATELET
Basophils Absolute: 0 10*3/uL (ref 0.0–0.1)
Basophils Relative: 0.5 % (ref 0.0–3.0)
EOS PCT: 3.9 % (ref 0.0–5.0)
Eosinophils Absolute: 0.3 10*3/uL (ref 0.0–0.7)
HEMATOCRIT: 40.9 % (ref 39.0–52.0)
Hemoglobin: 13.9 g/dL (ref 13.0–17.0)
LYMPHS ABS: 1.5 10*3/uL (ref 0.7–4.0)
LYMPHS PCT: 18.2 % (ref 12.0–46.0)
MCHC: 34 g/dL (ref 30.0–36.0)
MCV: 90.6 fl (ref 78.0–100.0)
MONOS PCT: 11.2 % (ref 3.0–12.0)
Monocytes Absolute: 1 10*3/uL (ref 0.1–1.0)
Neutro Abs: 5.6 10*3/uL (ref 1.4–7.7)
Neutrophils Relative %: 66.2 % (ref 43.0–77.0)
PLATELETS: 271 10*3/uL (ref 150.0–400.0)
RBC: 4.51 Mil/uL (ref 4.22–5.81)
RDW: 16.5 % — ABNORMAL HIGH (ref 11.5–15.5)
WBC: 8.5 10*3/uL (ref 4.0–10.5)

## 2015-02-26 MED ORDER — SPIRONOLACTONE 25 MG PO TABS: 25.0000 mg | ORAL_TABLET | Freq: Every day | ORAL | Status: DC

## 2015-02-26 MED ORDER — CLOPIDOGREL BISULFATE 75 MG PO TABS: 75.0000 mg | ORAL_TABLET | Freq: Every day | ORAL | Status: DC

## 2015-02-26 NOTE — Patient Instructions (Addendum)
Your physician has recommended that you have a defibrillator inserted. An implantable cardioverter defibrillator (ICD) is a small device that is placed in your chest or, in rare cases, your abdomen. This device uses electrical pulses or shocks to help control life-threatening, irregular heartbeats that could lead the heart to suddenly stop beating (sudden cardiac arrest). Leads are attached to the ICD that goes into your heart. This is done in the hospital and usually requires an overnight stay. Please see the instruction sheet given to you today for more information.  Your physician recommends that you schedule a follow-up appointment in: 7 to 10 days from 02/28/2015 in device clinic for wound check.  See instruction sheet for procedure.  Your physician recommends that you have lab work today BMP and CBC.  Your physician has recommended you make the following change in your medication:    1. Stop Effient  2. Start Plavix 75 mg by mouth daily  Cardioverter Defibrillator Implantation An implantable cardioverter defibrillator (ICD) is a small, lightweight, battery-powered device that is placed (implanted) under the skin in the chest or abdomen. Your caregiver may prescribe an ICD if:  You have had an irregular heart rhythm (arrhythmia) that originated in the lower chambers of the heart (ventricles).  Your heart has been damaged by a disease (such as coronary artery disease) or heart condition (such as a heart attack). An ICD consists of a battery that lasts several years, a small computer called a pulse generator, and wires called leads that go into the heart. It is used to detect and correct two dangerous arrhythmias: a rapid heart rhythm (tachycardia) and an arrhythmia in which the ventricles contract in an uncoordinated way (fibrillation). When an ICD detects tachycardia, it sends an electrical signal to the heart that restores the heartbeat to normal (cardioversion). This signal is usually  painless. If cardioversion does not work or if the ICD detects fibrillation, it delivers a small electrical shock to the heart (defibrillation) to restart the heart. The shock may feel like a strong jolt in the chest.ICDs may be programmed to correct other problems. Sometimes, ICDs are programmed to act as another type of implantable device called a pacemaker. Pacemakers are used to treat a slow heartbeat (bradycardia). LET YOUR CAREGIVER KNOW ABOUT:  Any allergies you have.  All medicines you are taking, including vitamins, herbs, eyedrops, and over-the-counter medicines and creams.  Previous problems you or members of your family have had with the use of anesthetics.  Any blood disorders you have had.  Other health problems you have. RISKS AND COMPLICATIONS Generally, the procedure to implant an ICD is safe. However, as with any surgical procedure, complications can occur. Possible complications associated with implanting an ICD include:  Swelling, bleeding, or bruising at the site where the ICD was implanted.  Infection at the site where the ICD was implanted.  A reaction to medicine used during the procedure.  Nerve, heart, or blood vessel damage.  Blood clots. BEFORE THE PROCEDURE  You may need to have blood tests, heart tests, or a chest X-ray done before the day of the procedure.  Ask your caregiver about changing or stopping your regular medicines.  Make plans to have someone drive you home. You may need to stay in the hospital overnight after the procedure.  Stop smoking at least 24 hours before the procedure.  Take a bath or shower the night before the procedure. You may need to scrub your chest or abdomen with a special type of soap.  Do not eat or drink before your procedure for as long as directed by your caregiver. Ask if it is okay to take any needed medicine with a small sip of water. PROCEDURE  The procedure to implant an ICD in your chest or abdomen is usually  done at a hospital in a room that has a large X-ray machine called a fluoroscope. The machine will be above you during the procedure. It will help your caregiver see your heart during the procedure. Implanting an ICD usually takes 1-3 hours. Before the procedure:   Small monitors will be put on your body. They will be used to check your heart, blood pressure, and oxygen level.  A needle will be put into a vein in your hand or arm. This is called an intravenous (IV) access tube. Fluids and medicine will flow directly into your body through the IV tube.  Your chest or abdomen will be cleaned with a germ-killing (antiseptic) solution. The area may be shaved.  You may be given medicine to help you relax (sedative).  You will be given a medicine called a local anesthetic. This medicine will make the surgical site numb while the ICD is implanted. You will be sleepy but awake during the procedure. After you are numb the procedure will begin. The caregiver will:  Make a small cut (incision). This will make a pocket deep under your skin that will hold the pulse generator.  Guide the leads through a large blood vessel into your heart and attach them to the heart muscles. Depending on the ICD, the leads may go into one ventricle or they may go to both ventricles and into an upper chamber of the heart (atrium).  Test the ICD.  Close the incision with stitches, glue, or staples. AFTER THE PROCEDURE  You may feel pain. Some pain is normal. It may last a few days.  You may stay in a recovery area until the local anesthetic has worn off. Your blood pressure and pulse will be checked often. You will be taken to a room where your heart will be monitored.  A chest X-ray will be taken. This is done to check that the cardioverter defibrillator is in the right place.  You may stay in the hospital overnight.  A slight bump may be seen over the skin where the ICD was placed. Sometimes, it is possible to feel  the ICD under the skin. This is normal.  In the months and years afterward, your caregiver will check the device, the leads, and the battery every few months. Eventually, when the battery is low, the ICD will be replaced. Document Released: 08/16/2002 Document Revised: 09/14/2013 Document Reviewed: 12/13/2012 [facility] Patient Information 2015 [employer] [place]. This information is not intended to replace advice given to you by your health care provider. Make sure you discuss any questions you have with your health care provider.

## 2015-02-26 NOTE — Progress Notes (Signed)
 Primary cardiologist:  Dr Cooper Also followed in CHF clinic  The patient presents today for routine electrophysiology followup.  He continues to make slow progress.  His Lifevest interrogation today reveals good device wear complaince with no arrhythmias.  He continues to have SOB with moderate activity.  Hypotension limits medical therapy.  He has occasional dizziness.  Today, he denies symptoms of palpitations,  lower extremity edema, presyncope, syncope, or neurologic sequela.  He has occasional SOB and orthopnea but feels that this continues to improve.  His chest pain is currently resolved.  The patient feels that he is tolerating medications without difficulties and is otherwise without complaint today.   Past Medical History  Diagnosis Date  . Hypertension   . Ischemic cardiomyopathy     a. 11/2014: EF reportedly 20% by cath and echo in [place].  . VF (ventricular fibrillation) 11/10/14    a. 11/2014: arrest with STEMI.  . Myocardial infarction 11/10/2014  . NSVT (nonsustained ventricular tachycardia) 11/16/14    a. 11/2014: admitted after discharge from STEMI admission, 18 beats 135-140bpm in ED.  . CAD (coronary artery disease)     a. 11/2014: arrest/STEMI s/p Xience DES to the LAD and PTCA to the D1 ([place], [place]). b. Relook cath 11/2014: no culprit, patent stent.  . Dyspnea     a. 11/2014: possibly due to combo of CHF and Brilinta. Brilinta changed to Effient.  . Tobacco abuse   . RBBB   . Dyslipidemia   . Abnormal TSH     a. 11/2014: felt d/t sick euthyroid.  . Pericarditis   . Sleep apnea     suspected but not diagnosed  . Dressler's syndrome     a. 11/2014: tx with colchicine and prednisone.  . Pericardial effusion     a. 11/2014.  . Erectile dysfunction     a. Pt aware not to take ED med within 24 hr of NTG and vice versa.   Past Surgical History  Procedure Laterality Date  . Coronary stent placement  11/10/14    LAD DES  . Hand surgery Bilateral   . Knee  arthroscopy  1982  . Left heart cath N/A 11/19/2014    Procedure: LEFT HEART CATH;  Surgeon: Guillaume J Berry, MD;  Location: [facility] CATH LAB;  Service: Cardiovascular;  Laterality: N/A;    Current Outpatient Prescriptions  Medication Sig Dispense Refill  . aspirin 81 MG tablet Take 81 mg by mouth daily.     . atorvastatin (LIPITOR) 20 MG tablet Take 1 tablet (20 mg total) by mouth daily.    . carvedilol (COREG) 12.5 MG tablet Take 1.5 tablets (18.75 mg total) by mouth 2 (two) times daily with a meal. 90 tablet 5  . colchicine 0.6 MG tablet Take 1 tablet (0.6 mg total) by mouth 2 (two) times daily. 60 tablet 4  . furosemide (LASIX) 40 MG tablet Take 1 tablet (40 mg total) by mouth daily. 60 tablet 5  . nitroGLYCERIN (NITROSTAT) 0.4 MG SL tablet Place 1 tablet (0.4 mg total) under the tongue every 5 (five) minutes as needed for chest pain. (Patient taking differently: Place 0.4 mg under the tongue every 5 (five) minutes as needed for chest pain (MAX 3 TABLETS). )    . spironolactone (ALDACTONE) 25 MG tablet Take 1 tablet (25 mg total) by mouth daily. 90 tablet 3  . clopidogrel (PLAVIX) 75 MG tablet Take 1 tablet (75 mg total) by mouth daily. 90 tablet 3   No current facility-administered   medications for this visit.    Allergies  Allergen Reactions  . Ambien [Zolpidem Tartrate] Anxiety and Other (See Comments)    Causes nightmares  . Lisinopril Cough  . Brilinta [Ticagrelor] Cough    ? Dyspnea. Changed to Effient 11/2014.    History   Social History  . Marital Status: Married    Spouse Name: N/A  . Number of Children: 2  . Years of Education: N/A   Occupational History  . Not on file.   Social History Main Topics  . Smoking status: Former Smoker -- 2.00 packs/day for 40 years    Types: Cigarettes    Quit date: 11/16/2014  . Smokeless tobacco: Never Used  . Alcohol Use: 0.0 oz/week    0 Standard drinks or equivalent per week     Comment: daily    wine   . Drug Use: No  .  Sexual Activity: Not on file   Other Topics Concern  . 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  Attended [employer] [place].  Now lives in [place].    Family History  Problem Relation Age of Onset  . CAD Neg Hx   . COPD Mother   . Hypertension Mother   . Hypertension Father     ROS-  All systems are reviewed and are negative except as outlined in the HPI above  Physical Exam: Filed Vitals:   02/26/15 1004  BP: 96/60  Pulse: 64  Height: 6' 3" (1.905 m)  Weight: 191 lb 9.6 oz (86.909 kg)    GEN- The patient is well appearing, alert and oriented x 3 today.   Head- normocephalic, atraumatic Eyes-  Sclera clear, conjunctiva pink Ears- hearing intact Oropharynx- clear Neck- supple, no JVP Lungs- Clear to ausculation bilaterally, normal work of breathing Heart- Regular rate and rhythm, no murmurs, rubs or gallops, PMI not laterally displaced GI- soft, NT, ND, + BS Extremities- no clubbing, cyanosis, or edema MS- no significant deformity or atrophy Skin- no rash or lesion Psych- euthymic mood, full affect Neuro- strength and sensation are intact  ekg today reveals sinus rhythm, incomplete RBBB with QRS ,130 msec Echo reveals EF 25%  Wearable defibrillator interrogation today is reviewed and reveals no arrhythmias, he is compliant with wearing his vest.  Assessment and Plan:  1. Ischemic CM/ CAD The patient continues to make slow recovery s/p recent AMI.  He underwent revascularization in [place] 11/10/14.  He had a lifevest placed.  EF > 90 days post revascularization reveals EF 25%.    He continues on coreg but has not been able to tolerate additional medical therapy due to hypotension. The patient has an ischemic CM (EF 25%), NYHA Class III CHF, and CAD. At this time, he meets MADIT II/ SCD-HeFT criteria for ICD implantation for primary prevention of sudden death.  Risks, benefits, alternatives to ICD implantation were discussed in  detail with the patient today. The patient  understands that the risks include but are not limited to bleeding, infection, pneumothorax, perforation, tamponade, vascular damage, renal failure, MI, stroke, death, inappropriate shocks, and lead dislodgement and wishes to proceed.  We will therefore schedule device implantation at the next available time. I have spoken with Dr Cooper who feels that we can switch effient to plavix at this time.  Due to bleeding risks, I will make this change in anticipation of ICD implant.  2. S/p VF arrest See above  3. Anxiety improved  4. Pericarditis   Continues to improve  

## 2015-02-26 NOTE — Telephone Encounter (Signed)
Aldactone sent in

## 2015-02-26 NOTE — Telephone Encounter (Signed)
New message     Patient was seen today . Need clarification on if medication will be called in or not.

## 2015-02-26 NOTE — Progress Notes (Signed)
Kyle KotykJay said he is for ICD implantation on Wednesday.

## 2015-02-27 DIAGNOSIS — Z8674 Personal history of sudden cardiac arrest: Secondary | ICD-10-CM | POA: Diagnosis not present

## 2015-02-27 DIAGNOSIS — Z886 Allergy status to analgesic agent status: Secondary | ICD-10-CM | POA: Diagnosis not present

## 2015-02-27 DIAGNOSIS — I2510 Atherosclerotic heart disease of native coronary artery without angina pectoris: Secondary | ICD-10-CM | POA: Diagnosis not present

## 2015-02-27 DIAGNOSIS — I4510 Unspecified right bundle-branch block: Secondary | ICD-10-CM | POA: Diagnosis not present

## 2015-02-27 DIAGNOSIS — Z7982 Long term (current) use of aspirin: Secondary | ICD-10-CM | POA: Diagnosis not present

## 2015-02-27 DIAGNOSIS — I319 Disease of pericardium, unspecified: Secondary | ICD-10-CM | POA: Diagnosis not present

## 2015-02-27 DIAGNOSIS — E785 Hyperlipidemia, unspecified: Secondary | ICD-10-CM | POA: Diagnosis not present

## 2015-02-27 DIAGNOSIS — Z87891 Personal history of nicotine dependence: Secondary | ICD-10-CM | POA: Diagnosis not present

## 2015-02-27 DIAGNOSIS — I509 Heart failure, unspecified: Secondary | ICD-10-CM | POA: Diagnosis not present

## 2015-02-27 DIAGNOSIS — F419 Anxiety disorder, unspecified: Secondary | ICD-10-CM | POA: Diagnosis not present

## 2015-02-27 DIAGNOSIS — I10 Essential (primary) hypertension: Secondary | ICD-10-CM | POA: Diagnosis not present

## 2015-02-27 DIAGNOSIS — Z8679 Personal history of other diseases of the circulatory system: Secondary | ICD-10-CM | POA: Diagnosis not present

## 2015-02-27 DIAGNOSIS — Z7902 Long term (current) use of antithrombotics/antiplatelets: Secondary | ICD-10-CM | POA: Diagnosis not present

## 2015-02-27 DIAGNOSIS — Z888 Allergy status to other drugs, medicaments and biological substances status: Secondary | ICD-10-CM | POA: Diagnosis not present

## 2015-02-27 DIAGNOSIS — I255 Ischemic cardiomyopathy: Secondary | ICD-10-CM | POA: Diagnosis not present

## 2015-02-27 DIAGNOSIS — I252 Old myocardial infarction: Secondary | ICD-10-CM | POA: Diagnosis not present

## 2015-02-27 DIAGNOSIS — Z955 Presence of coronary angioplasty implant and graft: Secondary | ICD-10-CM | POA: Diagnosis not present

## 2015-02-27 MED ORDER — CEFAZOLIN SODIUM-DEXTROSE 2-3 GM-% IV SOLR
2.0000 g | INTRAVENOUS | Status: DC
  Filled 2015-02-27: qty 50

## 2015-02-27 MED ORDER — CHLORHEXIDINE GLUCONATE 4 % EX LIQD
60.0000 mL | Freq: Once | CUTANEOUS | Status: DC
  Filled 2015-02-27: qty 60

## 2015-02-27 MED ORDER — SODIUM CHLORIDE 0.9 % IV SOLN: 250.0000 mL | INTRAVENOUS | Status: DC

## 2015-02-27 MED ORDER — SODIUM CHLORIDE 0.9 % IV SOLN
INTRAVENOUS | Status: DC
  Administered 2015-02-28: 14:00:00 via INTRAVENOUS

## 2015-02-27 MED ORDER — SODIUM CHLORIDE 0.9 % IJ SOLN: 3.0000 mL | Freq: Two times a day (BID) | INTRAMUSCULAR | Status: DC

## 2015-02-27 MED ORDER — SODIUM CHLORIDE 0.9 % IR SOLN
80.0000 mg | Status: DC
  Filled 2015-02-27: qty 2

## 2015-02-27 MED ORDER — SODIUM CHLORIDE 0.9 % IJ SOLN: 3.0000 mL | INTRAMUSCULAR | Status: DC | PRN

## 2015-02-27 MED ORDER — MUPIROCIN 2 % EX OINT
1.0000 "application " | TOPICAL_OINTMENT | Freq: Once | CUTANEOUS | Status: AC
  Administered 2015-02-28: 1 via TOPICAL
  Filled 2015-02-27: qty 22

## 2015-02-28 ENCOUNTER — Encounter (HOSPITAL_COMMUNITY)
Admission: RE | Admit: 2015-02-28 | Payer: Medicare Other | Source: Ambulatory Visit | Attending: Cardiovascular Disease | Admitting: Cardiovascular Disease

## 2015-02-28 ENCOUNTER — Encounter (HOSPITAL_COMMUNITY): Payer: Self-pay | Admitting: *Deleted

## 2015-02-28 ENCOUNTER — Encounter (HOSPITAL_COMMUNITY): Payer: Medicare Other

## 2015-02-28 ENCOUNTER — Encounter (HOSPITAL_COMMUNITY)
Admission: RE | Disposition: A | Discharge status: Home / Self Care | Payer: Medicare Other | Source: Ambulatory Visit | Attending: Internal Medicine

## 2015-02-28 ENCOUNTER — Ambulatory Visit (HOSPITAL_COMMUNITY)
Admission: RE | Admit: 2015-02-28 | Discharge: 2015-03-01 | Disposition: A | Discharge status: Home / Self Care | Payer: Medicare Other | Source: Ambulatory Visit | Attending: Internal Medicine | Admitting: Internal Medicine

## 2015-02-28 DIAGNOSIS — Z7902 Long term (current) use of antithrombotics/antiplatelets: Secondary | ICD-10-CM | POA: Insufficient documentation

## 2015-02-28 DIAGNOSIS — Z886 Allergy status to analgesic agent status: Secondary | ICD-10-CM | POA: Insufficient documentation

## 2015-02-28 DIAGNOSIS — Z955 Presence of coronary angioplasty implant and graft: Secondary | ICD-10-CM | POA: Insufficient documentation

## 2015-02-28 DIAGNOSIS — I4510 Unspecified right bundle-branch block: Secondary | ICD-10-CM | POA: Diagnosis not present

## 2015-02-28 DIAGNOSIS — I4901 Ventricular fibrillation: Secondary | ICD-10-CM | POA: Diagnosis present

## 2015-02-28 DIAGNOSIS — I509 Heart failure, unspecified: Secondary | ICD-10-CM | POA: Diagnosis not present

## 2015-02-28 DIAGNOSIS — I252 Old myocardial infarction: Secondary | ICD-10-CM | POA: Insufficient documentation

## 2015-02-28 DIAGNOSIS — E785 Hyperlipidemia, unspecified: Secondary | ICD-10-CM | POA: Diagnosis not present

## 2015-02-28 DIAGNOSIS — Z87891 Personal history of nicotine dependence: Secondary | ICD-10-CM | POA: Insufficient documentation

## 2015-02-28 DIAGNOSIS — I10 Essential (primary) hypertension: Secondary | ICD-10-CM | POA: Insufficient documentation

## 2015-02-28 DIAGNOSIS — I319 Disease of pericardium, unspecified: Secondary | ICD-10-CM | POA: Diagnosis not present

## 2015-02-28 DIAGNOSIS — F419 Anxiety disorder, unspecified: Secondary | ICD-10-CM | POA: Diagnosis not present

## 2015-02-28 DIAGNOSIS — I255 Ischemic cardiomyopathy: Secondary | ICD-10-CM | POA: Insufficient documentation

## 2015-02-28 DIAGNOSIS — Z8674 Personal history of sudden cardiac arrest: Secondary | ICD-10-CM | POA: Insufficient documentation

## 2015-02-28 DIAGNOSIS — Z8679 Personal history of other diseases of the circulatory system: Secondary | ICD-10-CM | POA: Insufficient documentation

## 2015-02-28 DIAGNOSIS — Z7982 Long term (current) use of aspirin: Secondary | ICD-10-CM | POA: Insufficient documentation

## 2015-02-28 DIAGNOSIS — Z959 Presence of cardiac and vascular implant and graft, unspecified: Secondary | ICD-10-CM

## 2015-02-28 DIAGNOSIS — Z95810 Presence of automatic (implantable) cardiac defibrillator: Secondary | ICD-10-CM

## 2015-02-28 DIAGNOSIS — I2510 Atherosclerotic heart disease of native coronary artery without angina pectoris: Secondary | ICD-10-CM | POA: Insufficient documentation

## 2015-02-28 DIAGNOSIS — Z888 Allergy status to other drugs, medicaments and biological substances status: Secondary | ICD-10-CM | POA: Insufficient documentation

## 2015-02-28 DIAGNOSIS — I5022 Chronic systolic (congestive) heart failure: Secondary | ICD-10-CM

## 2015-02-28 HISTORY — DX: Heart failure, unspecified: I50.9

## 2015-02-28 HISTORY — DX: Presence of automatic (implantable) cardiac defibrillator: Z95.810

## 2015-02-28 HISTORY — PX: CARDIAC DEFIBRILLATOR PLACEMENT: SHX171

## 2015-02-28 HISTORY — DX: ST elevation (STEMI) myocardial infarction of unspecified site: I21.3

## 2015-02-28 HISTORY — DX: Pneumonia, unspecified organism: J18.9

## 2015-02-28 HISTORY — PX: IMPLANTABLE CARDIOVERTER DEFIBRILLATOR IMPLANT: SHX5473

## 2015-02-28 LAB — SURGICAL PCR SCREEN
MRSA, PCR: NEGATIVE
STAPHYLOCOCCUS AUREUS: NEGATIVE

## 2015-02-28 SURGERY — IMPLANTABLE CARDIOVERTER DEFIBRILLATOR IMPLANT

## 2015-02-28 MED ORDER — ATORVASTATIN CALCIUM 20 MG PO TABS
20.0000 mg | ORAL_TABLET | Freq: Every day | ORAL | Status: DC
  Administered 2015-02-28: 20 mg via ORAL
  Filled 2015-02-28 (×2): qty 1

## 2015-02-28 MED ORDER — MIDAZOLAM HCL 5 MG/5ML IJ SOLN
INTRAMUSCULAR | Status: AC
  Filled 2015-02-28: qty 5

## 2015-02-28 MED ORDER — ACETAMINOPHEN 325 MG PO TABS
325.0000 mg | ORAL_TABLET | ORAL | Status: DC | PRN
Start: 2015-02-28 — End: 2015-03-01

## 2015-02-28 MED ORDER — FENTANYL CITRATE 0.05 MG/ML IJ SOLN
INTRAMUSCULAR | Status: AC
  Filled 2015-02-28: qty 2

## 2015-02-28 MED ORDER — CARVEDILOL 12.5 MG PO TABS
12.5000 mg | ORAL_TABLET | Freq: Two times a day (BID) | ORAL | Status: DC
  Administered 2015-02-28: 12.5 mg via ORAL
  Filled 2015-02-28 (×2): qty 1

## 2015-02-28 MED ORDER — SPIRONOLACTONE 25 MG PO TABS
25.0000 mg | ORAL_TABLET | Freq: Every day | ORAL | Status: DC
  Administered 2015-02-28 – 2015-03-01 (×2): 25 mg via ORAL
  Filled 2015-02-28 (×2): qty 1

## 2015-02-28 MED ORDER — ASPIRIN EC 81 MG PO TBEC
81.0000 mg | DELAYED_RELEASE_TABLET | Freq: Every day | ORAL | Status: DC
  Administered 2015-02-28 – 2015-03-01 (×2): 81 mg via ORAL
  Filled 2015-02-28 (×2): qty 1

## 2015-02-28 MED ORDER — CEFAZOLIN SODIUM 1-5 GM-% IV SOLN
1.0000 g | Freq: Four times a day (QID) | INTRAVENOUS | Status: AC
  Administered 2015-02-28 – 2015-03-01 (×3): 1 g via INTRAVENOUS
  Filled 2015-02-28 (×3): qty 50

## 2015-02-28 MED ORDER — SODIUM CHLORIDE 0.9 % IJ SOLN: 3.0000 mL | INTRAMUSCULAR | Status: DC | PRN

## 2015-02-28 MED ORDER — COLCHICINE 0.6 MG PO TABS
0.6000 mg | ORAL_TABLET | Freq: Two times a day (BID) | ORAL | Status: DC
  Administered 2015-02-28 – 2015-03-01 (×2): 0.6 mg via ORAL
  Filled 2015-02-28 (×3): qty 1

## 2015-02-28 MED ORDER — ONDANSETRON HCL 4 MG/2ML IJ SOLN: 4.0000 mg | Freq: Four times a day (QID) | INTRAMUSCULAR | Status: DC | PRN

## 2015-02-28 MED ORDER — HEPARIN (PORCINE) IN NACL 2-0.9 UNIT/ML-% IJ SOLN
INTRAMUSCULAR | Status: AC
  Filled 2015-02-28: qty 500

## 2015-02-28 MED ORDER — HYDROCODONE-ACETAMINOPHEN 5-325 MG PO TABS
1.0000 | ORAL_TABLET | ORAL | Status: DC | PRN
  Administered 2015-02-28: 2 via ORAL
  Filled 2015-02-28: qty 2

## 2015-02-28 MED ORDER — LIDOCAINE HCL (PF) 1 % IJ SOLN
INTRAMUSCULAR | Status: AC
  Filled 2015-02-28: qty 60

## 2015-02-28 MED ORDER — SODIUM CHLORIDE 0.9 % IJ SOLN
3.0000 mL | Freq: Two times a day (BID) | INTRAMUSCULAR | Status: DC
  Administered 2015-02-28: 3 mL via INTRAVENOUS

## 2015-02-28 MED ORDER — CEFAZOLIN SODIUM-DEXTROSE 2-3 GM-% IV SOLR
INTRAVENOUS | Status: AC
  Filled 2015-02-28: qty 50

## 2015-02-28 MED ORDER — CARVEDILOL 12.5 MG PO TABS
12.5000 mg | ORAL_TABLET | Freq: Two times a day (BID) | ORAL | Status: DC
  Administered 2015-03-01: 12.5 mg via ORAL
  Filled 2015-02-28 (×3): qty 1

## 2015-02-28 MED ORDER — MUPIROCIN 2 % EX OINT
TOPICAL_OINTMENT | CUTANEOUS | Status: AC
  Filled 2015-02-28: qty 22

## 2015-02-28 MED ORDER — CLOPIDOGREL BISULFATE 75 MG PO TABS
75.0000 mg | ORAL_TABLET | Freq: Every day | ORAL | Status: DC
  Administered 2015-02-28 – 2015-03-01 (×2): 75 mg via ORAL
  Filled 2015-02-28 (×2): qty 1

## 2015-02-28 MED ORDER — SODIUM CHLORIDE 0.9 % IV SOLN: 250.0000 mL | INTRAVENOUS | Status: DC | PRN

## 2015-02-28 MED ORDER — FUROSEMIDE 40 MG PO TABS
40.0000 mg | ORAL_TABLET | Freq: Every day | ORAL | Status: DC
  Administered 2015-02-28 – 2015-03-01 (×2): 40 mg via ORAL
  Filled 2015-02-28 (×2): qty 1

## 2015-02-28 NOTE — Interval H&P Note (Signed)
History and Physical Interval Note:  02/28/2015 1:29 PM  Jabier MuttonJonathan D Hasting  has presented today for surgery, with the diagnosis of lv disfunction  The various methods of treatment have been discussed with the patient and family. After consideration of risks, benefits and other options for treatment, the patient has consented to  Procedure(s): IMPLANTABLE CARDIOVERTER DEFIBRILLATOR IMPLANT (N/A) as a surgical intervention .  The patient's history has been reviewed, patient examined, no change in status, stable for surgery.  I have reviewed the patient's chart and labs.  Questions were answered to the patient's satisfaction.     ICD Criteria  Current LVEF: 25% ;Obtained < 1 month ago.  NYHA Functional Classification: Class III  Heart Failure History:  No.  Non-Ischemic Dilated Cardiomyopathy History:  No.  Atrial Fibrillation/Atrial Flutter:  No.  Ventricular Tachycardia History:  Yes, Hemodynamic instability present, VT Type:  SVT - Polymorphic.  Cardiac Arrest History:  Yes, This was a Ventricular Tachycardia/Ventricular Fibrillation Arrest. This was NOT a bradycardia arrest.  History of Syndromes with Risk of Sudden Death:  No.  Previous ICD:  No.  Electrophysiology Study: No.  Prior MI:  Yes > 40 days  PPM: No.  OSA:  No  Patient Life Expectancy of >=1 year: Yes.  Anticoagulation Therapy:  Patient is NOT on anticoagulation therapy.   Beta Blocker Therapy:  Yes.   Ace Inhibitor/ARB Therapy:  No, due to hypotension  Hillis RangeJames Mayvis Agudelo

## 2015-02-28 NOTE — Op Note (Signed)
SURGEON:  Hillis RangeJames Nolberto Cheuvront, MD      PREPROCEDURE DIAGNOSES:   1. Ischemic cardiomyopathy.   2. New York Heart Association class III, heart failure chronically.   3. Prior VF arrest     POSTPROCEDURE DIAGNOSES:   1. Ischemic cardiomyopathy.   2. New York Heart Association class III heart failure chronically.   3. Prior VF arrest     PROCEDURES:    1. ICD implantation.  2. Left upper extremity venography  3. Defibrillation threshold testing     INTRODUCTION: Kyle MuttonJonathan D Bullock is a 64 y.o. male with an ischemic CM (EF25%), NYHA Class III CHF, and CAD. At this time, he meets MADIT II/ SCD-HeFT criteria for ICD implantation for primary prevention of sudden death.  He is s/p VF arrest in the setting of STEMI 12/15 for which he was successfully defibrillated.  The patient has a narrow QRS and does not meet criteria for revascularization.  The patient has been treated with an optimal medical regimen but continues to have a depressed ejection fraction and NYHA Class III CHF symptoms.  The patient therefore  presents today for ICD implantation.      DESCRIPTION OF PROCEDURE:  Informed written consent was obtained and the patient was brought to the electrophysiology lab in the fasting state. The patient was adequately sedated with intravenous Versed, and fentanyl as outlined in the nursing report.  The patient's left chest was prepped and draped in the usual sterile fashion by the EP lab staff.  The skin overlying the left deltopectoral region was infiltrated with lidocaine for local analgesia.  A 5-cm incision was made over the left deltopectoral region.  A left subcutaneous defibrillator pocket was fashioned using a combination of sharp and blunt dissection.  Electrocautery was used to assure hemostasis.   Left Upper extremity Venography:  A venogram of the left upper extremity was performed which revealed a moderate sized left axillary vein which emptied into a moderate sized left subclavian vein.     RA/RV Lead Placement: The left axillary vein was cannulated with fluoroscopic visualization.  No contrast was required for this endeavor.  Through the left axillary vein, a [employer] Jude Medical IthacaDurata, model [id number]-697122Q-65 (serial number [id number]) right ventricular defibrillator lead was advanced with fluoroscopic visualization into the right ventricular apical septum. Right ventricular lead R-wave measured 8.4 mV with impedance of 616 ohms and a threshold of 0.7 volts at 0.5 milliseconds.   The lead was secured to the pectoralis  fascia using #2 silk suture over the suture sleeve.  The pocket then  irrigated with copious gentamicin solution.  The lead was then connected to a St. Jude Medical Fortify Assura VR model [id number] (serial  Number [id number]) ICD.  The defibrillator was placed into the  pocket.  The pocket was then closed in 2 layers with 2.0 Vicryl suture  for the subcutaneous and subcuticular layers.  EBL<7010ml. Steri-Strips and a  sterile dressing were then applied.   DFT Testing: Defibrillation Threshold testing was then performed. Ventricular fibrillation was induced with a T shock.  Adequate sensing of ventricular  fibrillation was observed with minimal dropout with a programmed sensitivity of 1.430mV.  The patient was successfully defibrillated to sinus rhythm with a single 15 joules shock delivered from the device with an impedance of 72 ohms in a duration of 5.5 seconds.  The patient remained in sinus rhythm thereafter.  There were no early apparent complications.  Programmed Extrastimulus testing:  Programmed extrastimulus testing was performed through the device  with a basic cycle length of with S1,S2,S3,S4 extrastimuli down to refractoriness (500/280/260/240 msec) with no sustained VT or VF observed.  The procedure was therefore considered completed.  There were no early apparhent complications.     CONCLUSIONS:   1. Ischemic cardiomyopathy with chronic New York Heart Association class  III heart failure.   2. Successful ICD implantation.   3. DFT less than or equal to 15 joules.   4. No inducible VT or VF with PES  5. No early apparent complications.   Hillis Range MD, [facility] 02/28/2015 3:20 PM

## 2015-02-28 NOTE — H&P (View-Only) (Signed)
Primary cardiologist:  Dr Excell Seltzer Also followed in CHF clinic  The patient presents today for routine electrophysiology followup.  He continues to make slow progress.  His Lifevest interrogation today reveals good device wear complaince with no arrhythmias.  He continues to have SOB with moderate activity.  Hypotension limits medical therapy.  He has occasional dizziness.  Today, he denies symptoms of palpitations,  lower extremity edema, presyncope, syncope, or neurologic sequela.  He has occasional SOB and orthopnea but feels that this continues to improve.  His chest pain is currently resolved.  The patient feels that he is tolerating medications without difficulties and is otherwise without complaint today.   Past Medical History  Diagnosis Date  . Hypertension   . Ischemic cardiomyopathy     a. 11/2014: EF reportedly 20% by cath and echo in [place].  . VF (ventricular fibrillation) 11/10/14    a. 11/2014: arrest with STEMI.  Marland Kitchen Myocardial infarction 11/10/2014  . NSVT (nonsustained ventricular tachycardia) 11/16/14    a. 11/2014: admitted after discharge from STEMI admission, 18 beats 135-140bpm in ED.  Marland Kitchen CAD (coronary artery disease)     a. 11/2014: arrest/STEMI s/p Xience DES to the LAD and PTCA to the D1 [place], [place]). b. Relook cath 11/2014: no culprit, patent stent.  Marland Kitchen Dyspnea     a. 11/2014: possibly due to combo of CHF and Brilinta. Brilinta changed to Effient.  . Tobacco abuse   . RBBB   . Dyslipidemia   . Abnormal TSH     a. 11/2014: felt d/t sick euthyroid.  Marland Kitchen Pericarditis   . Sleep apnea     suspected but not diagnosed  . Dressler's syndrome     a. 11/2014: tx with colchicine and prednisone.  . Pericardial effusion     a. 11/2014.  Marland Kitchen Erectile dysfunction     a. Pt aware not to take ED med within 24 hr of NTG and vice versa.   Past Surgical History  Procedure Laterality Date  . Coronary stent placement  11/10/14    LAD DES  . Hand surgery Bilateral   . Knee  arthroscopy  1982  . Left heart cath N/A 11/19/2014    Procedure: LEFT HEART CATH;  Surgeon: Runell Gess, MD;  Location: [facility] C[facility] LAB;  Service: Cardiovascular;  Laterality: N/A;    Current Outpatient Prescriptions  Medication Sig Dispense Refill  . aspirin 81 MG tablet Take 81 mg by mouth daily.     Marland Kitchen atorvastatin (LIPITOR) 20 MG tablet Take 1 tablet (20 mg total) by mouth daily.    . carvedilol (COREG) 12.5 MG tablet Take 1.5 tablets (18.75 mg total) by mouth 2 (two) times daily with a meal. 90 tablet 5  . colchicine 0.6 MG tablet Take 1 tablet (0.6 mg total) by mouth 2 (two) times daily. 60 tablet 4  . furosemide (LASIX) 40 MG tablet Take 1 tablet (40 mg total) by mouth daily. 60 tablet 5  . nitroGLYCERIN (NITROSTAT) 0.4 MG SL tablet Place 1 tablet (0.4 mg total) under the tongue every 5 (five) minutes as needed for chest pain. (Patient taking differently: Place 0.4 mg under the tongue every 5 (five) minutes as needed for chest pain (MAX 3 TABLETS). )    . spironolactone (ALDACTONE) 25 MG tablet Take 1 tablet (25 mg total) by mouth daily. 90 tablet 3  . clopidogrel (PLAVIX) 75 MG tablet Take 1 tablet (75 mg total) by mouth daily. 90 tablet 3   No current facility-administered  medications for this visit.    Allergies  Allergen Reactions  . Ambien [Zolpidem Tartrate] Anxiety and Other (See Comments)    Causes nightmares  . Lisinopril Cough  . Brilinta [Ticagrelor] Cough    ? Dyspnea. Changed to Effient 11/2014.    History   Social History  . Marital Status: Married    Spouse Name: N/A  . Number of Children: 2  . Years of Education: N/A   Occupational History  . Not on file.   Social History Main Topics  . Smoking status: Former Smoker -- 2.00 packs/day for 40 years    Types: Cigarettes    Quit date: 11/16/2014  . Smokeless tobacco: Never Used  . Alcohol Use: 0.0 oz/week    0 Standard drinks or equivalent per week     Comment: daily    wine   . Drug Use: No  .  Sexual Activity: Not on file   Other Topics Concern  . 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  Attended [employer].  Now lives in [place].    Family History  Problem Relation Age of Onset  . CAD Neg Hx   . COPD Mother   . Hypertension Mother   . Hypertension Father     ROS-  All systems are reviewed and are negative except as outlined in the HPI above  Physical Exam: Filed Vitals:   02/26/15 1004  BP: 96/60  Pulse: 64  Height:  (1.905 m)  Weight: 191 lb 9.6 oz (86.909 kg)    GEN- The patient is well appearing, alert and oriented x 3 today.   Head- normocephalic, atraumatic Eyes-  Sclera clear, conjunctiva pink Ears- hearing intact Oropharynx- clear Neck- supple, no JVP Lungs- Clear to ausculation bilaterally, normal work of breathing Heart- Regular rate and rhythm, no murmurs, rubs or gallops, PMI not laterally displaced GI- soft, NT, ND, + BS Extremities- no clubbing, cyanosis, or edema MS- no significant deformity or atrophy Skin- no rash or lesion Psych- euthymic mood, full affect Neuro- strength and sensation are intact  ekg today reveals sinus rhythm, incomplete RBBB with QRS ,130 msec Echo reveals EF 25%  Wearable defibrillator interrogation today is reviewed and reveals no arrhythmias, he is compliant with wearing his vest.  Assessment and Plan:  1. Ischemic CM/ CAD The patient continues to make slow recovery s/p recent AMI.  He underwent revascularization in [facility] 11/10/14.  He had a lifevest placed.  EF > 90 days post revascularization reveals EF 25%.    He continues on coreg but has not been able to tolerate additional medical therapy due to hypotension. The patient has an ischemic CM (EF 25%), NYHA Class III CHF, and CAD. At this time, he meets MADIT II/ SCD-HeFT criteria for ICD implantation for primary prevention of sudden death.  Risks, benefits, alternatives to ICD implantation were discussed in  detail with the patient today. The patient  understands that the risks include but are not limited to bleeding, infection, pneumothorax, perforation, tamponade, vascular damage, renal failure, MI, stroke, death, inappropriate shocks, and lead dislodgement and wishes to proceed.  We will therefore schedule device implantation at the next available time. I have spoken with Dr Excell Seltzer who feels that we can switch effient to plavix at this time.  Due to bleeding risks, I will make this change in anticipation of ICD implant.  2. S/p VF arrest See above  3. Anxiety improved  4. Pericarditis  Continues to improve

## 2015-03-01 ENCOUNTER — Ambulatory Visit (HOSPITAL_COMMUNITY): Payer: Medicare Other

## 2015-03-01 DIAGNOSIS — Z48812 Encounter for surgical aftercare following surgery on the circulatory system: Secondary | ICD-10-CM | POA: Diagnosis not present

## 2015-03-01 DIAGNOSIS — Z45018 Encounter for adjustment and management of other part of cardiac pacemaker: Secondary | ICD-10-CM | POA: Diagnosis not present

## 2015-03-01 DIAGNOSIS — I4901 Ventricular fibrillation: Secondary | ICD-10-CM

## 2015-03-01 DIAGNOSIS — I5022 Chronic systolic (congestive) heart failure: Secondary | ICD-10-CM | POA: Diagnosis not present

## 2015-03-01 DIAGNOSIS — Z79899 Other long term (current) drug therapy: Secondary | ICD-10-CM | POA: Diagnosis not present

## 2015-03-01 DIAGNOSIS — I252 Old myocardial infarction: Secondary | ICD-10-CM | POA: Diagnosis not present

## 2015-03-01 DIAGNOSIS — E785 Hyperlipidemia, unspecified: Secondary | ICD-10-CM | POA: Diagnosis not present

## 2015-03-01 DIAGNOSIS — I255 Ischemic cardiomyopathy: Secondary | ICD-10-CM

## 2015-03-01 DIAGNOSIS — I4510 Unspecified right bundle-branch block: Secondary | ICD-10-CM | POA: Diagnosis not present

## 2015-03-01 DIAGNOSIS — Z955 Presence of coronary angioplasty implant and graft: Secondary | ICD-10-CM | POA: Diagnosis not present

## 2015-03-01 DIAGNOSIS — I319 Disease of pericardium, unspecified: Secondary | ICD-10-CM | POA: Diagnosis not present

## 2015-03-01 DIAGNOSIS — F419 Anxiety disorder, unspecified: Secondary | ICD-10-CM | POA: Diagnosis not present

## 2015-03-01 DIAGNOSIS — I241 Dressler's syndrome: Secondary | ICD-10-CM | POA: Diagnosis not present

## 2015-03-01 DIAGNOSIS — I509 Heart failure, unspecified: Secondary | ICD-10-CM | POA: Diagnosis not present

## 2015-03-01 LAB — BASIC METABOLIC PANEL
Anion gap: 10 (ref 5–15)
BUN: 17 mg/dL (ref 6–23)
CO2: 26 mmol/L (ref 19–32)
CREATININE: 1.11 mg/dL (ref 0.50–1.35)
Calcium: 9.3 mg/dL (ref 8.4–10.5)
Chloride: 102 mmol/L (ref 96–112)
GFR calc Af Amer: 79 mL/min — ABNORMAL LOW (ref >90)
GFR calc non Af Amer: 68 mL/min — ABNORMAL LOW (ref >90)
GLUCOSE: 92 mg/dL (ref 70–99)
POTASSIUM: 3.9 mmol/L (ref 3.5–5.1)
SODIUM: 138 mmol/L (ref 135–145)

## 2015-03-01 MED ORDER — ACETAMINOPHEN 325 MG PO TABS: 325.0000 mg | ORAL_TABLET | ORAL | Status: DC | PRN

## 2015-03-01 MED ORDER — HYDROCODONE-ACETAMINOPHEN 5-325 MG PO TABS: 1.0000 | ORAL_TABLET | Freq: Four times a day (QID) | ORAL | Status: DC | PRN

## 2015-03-01 NOTE — Discharge Summary (Signed)
ELECTROPHYSIOLOGY PROCEDURE DISCHARGE SUMMARY    Patient ID: Kyle Bullock,  MRN: [id number], DOB/AGE: 1951-04-12 64 y.o.  Admit date: 02/28/2015 Discharge date: 03/01/2015  Primary Care Physician: No PCP Per Patient Primary Cardiologist: Excell Seltzer Electrophysiologist: Herny Scurlock Also followed in AHF clinic  Primary Discharge Diagnosis:  Ischemic cardiomyopathy status post ICD implantation this admission for secondary prevention  Secondary Discharge Diagnosis:  1.  CAD s/p STEMI with DES to LAD 11/2014 2.  VF arrest 11/2014 3.  Hypertension 4.  Hyperlipidemia 5.  RBBB  Allergies  Allergen Reactions  . Ambien [Zolpidem Tartrate] Anxiety and Other (See Comments)    Causes nightmares  . Lisinopril Cough  . Brilinta [Ticagrelor] Cough    ? Dyspnea. Changed to Effient 11/2014.     Procedures This Admission:  1.  Implantation of a STJ single chamber ICD on 02-28-15 by Dr Johney Frame.  The patient received a STJ model number Fortify Assura ICD with model number 7122Q right ventricular lead.  DFT's were successful at 15 J.  There were no immediate post procedure complications. 2.  CXR on 03-01-15 demonstrated no pneumothorax status post device implantation.   Brief HPI: Kyle Bullock is a 64 y.o. male was referred to electrophysiology in the outpatient setting for consideration of ICD implantation.  He suffered VF arrest in December of 2015 in the setting of STEMI and underwent DES to LAD at that time. Since discharge, he has been wearing a LifeVest with no further ventricular arrhythmias.  Repeat echocardiogram demonstrated no improvement in EF post revascularization.  The patient has persistent LV dysfunction despite guideline directed therapy.  Risks, benefits, and alternatives to ICD implantation were reviewed with the patient who wished to proceed.   Hospital Course:  The patient was admitted and underwent implantation of a STJ single chamber ICD with details as outlined above. He  was monitored on telemetry overnight which demonstrated sinus rhythm.  Left chest was without hematoma or ecchymosis.  The device was interrogated and found to be functioning normally.  CXR was obtained and demonstrated no pneumothorax status post device implantation.  Wound care, arm mobility, and restrictions were reviewed with the patient.  The patient was examined and considered stable for discharge to home.   The patient's discharge medications include a beta blocker (Coreg). Not on ACE-I or ARB due to hypotension  No driving for 6 months post VF arrest (will be able to drive again 03-980)  Physical Exam: Filed Vitals:   02/28/15 1900 02/28/15 2018 03/01/15 0040 03/01/15 0524  BP: 109/80 103/64 96/62 103/56  Pulse: 60 61 64 60  Temp:  98 F (36.7 C)  97.5 F (36.4 C)  TempSrc:  Oral  Oral  Resp:  Height:      Weight:    190 lb 0.6 oz (86.2 kg)  SpO2:  99% 99% 99%    GEN- The patient is well appearing, alert and oriented x 3 today.   HEENT: normocephalic, atraumatic; sclera clear, conjunctiva pink; hearing intact; oropharynx clear; neck supple, no JVP Lymph- no cervical lymphadenopathy Lungs- Clear to ausculation bilaterally, normal work of breathing.  No wheezes, rales, rhonchi Heart- Regular rate and rhythm, no murmurs, rubs or gallops, PMI not laterally displaced GI- soft, non-tender, non-distended, bowel sounds present, no hepatosplenomegaly Extremities- no clubbing, cyanosis, or edema; DP/PT/radial pulses 2+ bilaterally MS- no significant deformity or atrophy Skin- warm and dry, no rash or lesion, left chest without hematoma/ecchymosis Psych- euthymic mood, full affect Neuro-  strength and sensation are intact   Labs:   Lab Results  Component Value Date   WBC 8.5 02/26/2015   HGB 13.9 02/26/2015   HCT 40.9 02/26/2015   MCV 90.6 02/26/2015   PLT 271.0 02/26/2015     Recent Labs Lab 03/01/15 0455  NA 138  K 3.9  CL 102  CO2 26  BUN 17  CREATININE  1.11  CALCIUM 9.3  GLUCOSE 92    Discharge Medications:    Medication List    STOP taking these medications        prasugrel 10 MG Tabs tablet  Commonly known as:  EFFIENT      TAKE these medications        acetaminophen 325 MG tablet  Commonly known as:  TYLENOL  Take 1-2 tablets (325-650 mg total) by mouth every 4 (four) hours as needed for mild pain.     aspirin EC 81 MG tablet  Take 81 mg by mouth daily.     atorvastatin 20 MG tablet  Commonly known as:  LIPITOR  Take 1 tablet (20 mg total) by mouth daily.     carvedilol 12.5 MG tablet  Commonly known as:  COREG  Take 1.5 tablets (18.75 mg total) by mouth 2 (two) times daily with a meal.     clopidogrel 75 MG tablet  Commonly known as:  PLAVIX  Take 1 tablet (75 mg total) by mouth daily.     colchicine 0.6 MG tablet  Take 1 tablet (0.6 mg total) by mouth 2 (two) times daily.     furosemide 40 MG tablet  Commonly known as:  LASIX  Take 1 tablet (40 mg total) by mouth daily.     HYDROcodone-acetaminophen 5-325 MG per tablet  Commonly known as:  NORCO  Take 1 tablet by mouth every 6 (six) hours as needed for moderate pain.     nitroGLYCERIN 0.4 MG SL tablet  Commonly known as:  NITROSTAT  Place 1 tablet (0.4 mg total) under the tongue every 5 (five) minutes as needed for chest pain.     spironolactone 25 MG tablet  Commonly known as:  ALDACTONE  Take 1 tablet (25 mg total) by mouth daily.        Disposition:  Discharge Instructions    Diet - low sodium heart healthy    Complete by:  As directed      Increase activity slowly    Complete by:  As directed           Follow-up Information    Follow up with [facility] On 03/12/2015.   Why:  at 3:30PM   Contact information:   [address] [place] [id number]       Follow up with Tonny BollmanMichael Cooper, MD On 03/21/2015.   Specialty:  Cardiology   Why:  at 2:45PM   Contact information:   [address] [place] [address] [phone]       Duration of Discharge Encounter: Greater than 30 minutes including physician time.  Signed, Gypsy BalsamAmber Seiler, NP 03/01/2015 9:25 AM     Hillis RangeJames Lulia Schriner MD

## 2015-03-01 NOTE — Progress Notes (Signed)
1130 dischargen instruction given to wiife and pt..Both verbalized understanding wheeeled to lobby by volunteer [phone]

## 2015-03-01 NOTE — Discharge Instructions (Signed)
° ° °  Supplemental Discharge Instructions for  Pacemaker/Defibrillator Patients  Activity No heavy lifting or vigorous activity with your left/right arm for 6 to 8 weeks.  Do not raise your left/right arm above your head for one week.  Gradually raise your affected arm as drawn below.           __         03-06-15               03-07-15               03-08-15                     03-09-15  NO DRIVING for 6 months post arrest       WOUND CARE - Keep the wound area clean and dry.  Do not get this area wet for one week. No showers for one week; you may shower on   03-09-15  . - The tape/steri-strips on your wound will fall off; do not pull them off.  No bandage is needed on the site.  DO  NOT apply any creams, oils, or ointments to the wound area. - If you notice any drainage or discharge from the wound, any swelling or bruising at the site, or you develop a fever > 101? F after you are discharged home, call the office at once.  Special Instructions - You are still able to use cellular telephones; use the ear opposite the side where you have your pacemaker/defibrillator.  Avoid carrying your cellular phone near your device. - When traveling through airports, show security personnel your identification card to avoid being screened in the metal detectors.  Ask the security personnel to use the hand wand. - Avoid arc welding equipment, MRI testing (magnetic resonance imaging), TENS units (transcutaneous nerve stimulators).  Call the office for questions about other devices. - Avoid electrical appliances that are in poor condition or are not properly grounded. - Microwave ovens are safe to be near or to operate.  Additional information for defibrillator patients should your device go off: - If your device goes off ONCE and you feel fine afterward, notify the device clinic nurses. - If your device goes off ONCE and you do not feel well afterward, call 911. - If your device goes off TWICE, call 911. - If  your device goes off THREE times in one day, call 911.  DO NOT DRIVE YOURSELF OR A FAMILY MEMBER WITH A DEFIBRILLATOR TO THE HOSPITAL--CALL 911.

## 2015-03-02 ENCOUNTER — Encounter (HOSPITAL_COMMUNITY): Payer: Medicare Other

## 2015-03-05 ENCOUNTER — Encounter (HOSPITAL_COMMUNITY): Payer: Medicare Other

## 2015-03-07 ENCOUNTER — Encounter (HOSPITAL_COMMUNITY): Payer: Medicare Other

## 2015-03-08 ENCOUNTER — Encounter (HOSPITAL_COMMUNITY): Payer: Self-pay

## 2015-03-08 ENCOUNTER — Ambulatory Visit (HOSPITAL_COMMUNITY)
Admission: RE | Admit: 2015-03-08 | Discharge: 2015-03-08 | Disposition: A | Discharge status: Home / Self Care | Payer: Medicare Other | Source: Ambulatory Visit | Attending: Internal Medicine | Admitting: Internal Medicine

## 2015-03-08 DIAGNOSIS — Z7902 Long term (current) use of antithrombotics/antiplatelets: Secondary | ICD-10-CM | POA: Insufficient documentation

## 2015-03-08 DIAGNOSIS — G4730 Sleep apnea, unspecified: Secondary | ICD-10-CM | POA: Diagnosis not present

## 2015-03-08 DIAGNOSIS — Z955 Presence of coronary angioplasty implant and graft: Secondary | ICD-10-CM | POA: Insufficient documentation

## 2015-03-08 DIAGNOSIS — I255 Ischemic cardiomyopathy: Secondary | ICD-10-CM | POA: Insufficient documentation

## 2015-03-08 DIAGNOSIS — Z95810 Presence of automatic (implantable) cardiac defibrillator: Secondary | ICD-10-CM | POA: Diagnosis not present

## 2015-03-08 DIAGNOSIS — I2510 Atherosclerotic heart disease of native coronary artery without angina pectoris: Secondary | ICD-10-CM | POA: Insufficient documentation

## 2015-03-08 DIAGNOSIS — I252 Old myocardial infarction: Secondary | ICD-10-CM | POA: Diagnosis not present

## 2015-03-08 DIAGNOSIS — I10 Essential (primary) hypertension: Secondary | ICD-10-CM | POA: Diagnosis not present

## 2015-03-08 DIAGNOSIS — Z7982 Long term (current) use of aspirin: Secondary | ICD-10-CM | POA: Diagnosis not present

## 2015-03-08 DIAGNOSIS — E785 Hyperlipidemia, unspecified: Secondary | ICD-10-CM | POA: Insufficient documentation

## 2015-03-08 DIAGNOSIS — Z87891 Personal history of nicotine dependence: Secondary | ICD-10-CM | POA: Diagnosis not present

## 2015-03-08 DIAGNOSIS — I5022 Chronic systolic (congestive) heart failure: Secondary | ICD-10-CM

## 2015-03-08 DIAGNOSIS — Z79899 Other long term (current) drug therapy: Secondary | ICD-10-CM | POA: Insufficient documentation

## 2015-03-08 LAB — BASIC METABOLIC PANEL
Anion gap: 13 (ref 5–15)
BUN: 26 mg/dL — ABNORMAL HIGH (ref 6–23)
CALCIUM: 10.1 mg/dL (ref 8.4–10.5)
CO2: 27 mmol/L (ref 19–32)
CREATININE: 1.25 mg/dL (ref 0.50–1.35)
Chloride: 99 mmol/L (ref 96–112)
GFR calc non Af Amer: 59 mL/min — ABNORMAL LOW (ref >90)
GFR, EST AFRICAN AMERICAN: 68 mL/min — AB (ref >90)
Glucose, Bld: 98 mg/dL (ref 70–99)
Potassium: 4.6 mmol/L (ref 3.5–5.1)
Sodium: 139 mmol/L (ref 135–145)

## 2015-03-08 MED ORDER — LOSARTAN POTASSIUM 25 MG PO TABS: 12.5000 mg | ORAL_TABLET | Freq: Every day | ORAL | Status: DC

## 2015-03-08 NOTE — Patient Instructions (Signed)
START Losartan 12.5mg  (1/2 tablet) at bedtime.  Will reschedule Cardiopulmonary exercise test for June.  Follow up 1 month.  Do the following things EVERYDAY: 1) Weigh yourself in the morning before breakfast. Write it down and keep it in a log. 2) Take your medicines as prescribed 3) Eat low salt foods-Limit salt (sodium) to 2000 mg per day.  4) Stay as active as you can everyday 5) Limit all fluids for the day to less than 2 liters

## 2015-03-08 NOTE — Progress Notes (Signed)
Patient ID: Kyle MuttonJonathan D Bullock, male   DOB: 1951/10/30, 64 y.o.   MRN: [id number]    Advanced HF Clinic Note   History of Present Illness: Kyle Bullock is a 64 y.o. male who is referred to the HF Clinic by Dr. Excell Seltzerooper. He has h/o CAD and ischemic cardiomyopathy 20-25%.    In December 2015, he suffered an acute anterolateral MI complicated by VF arrest in [place]. He underwent drug-eluting stent placement to the LAD and PTCA of the diagonal. EF was 25% and a life vest was placed prior to discharge. Following return to [place], he developed dyspnea and presented to [facility] where he was readmitted. His Brilinta was switched to Effient as it was felt the Brilinta may have been playing a role in his dyspnea. He was also switched from an ACE inhibitor to an ARB. He was seen by electrophysiology with recommendation for ongoing life vest therapy and follow-up echo in 90 days post revascularization. He was then seen in clinic in mid December where he reported pleuritic chest pain. He was readmitted and underwent diagnostic catheterization revealing patency of previously placed stent and area of balloon angioplasty in the diagonal. EF was 20-25% with diffuse hypokinesis and normal pericardial appearance. He did have some hypotension requiring discontinuation of ARB therapy. His beta blocker was also down titrated. He was diagnosed with Dressler's syndrome and was placed on prednisone taper and colchicine.  At last visit we stopped prednisone. Decreased lasix and started spiro. Underwent ICD placement on 3/23.  Dr. Johney FrameAllred cut carvedilol from 18.75 bid to 12.5 bid. Continues to feel better. Walking 2 miles every day in 17 minutes. Weight stable 189-190. Watching salt very closely.    Past Medical History  Diagnosis Date  . Hypertension   . Ischemic cardiomyopathy     a. 11/2014: EF reportedly 20% by cath and echo in [place].  . VF (ventricular fibrillation) 11/10/14    a. 11/2014: arrest with  STEMI.  Marland Kitchen. NSVT (nonsustained ventricular tachycardia) 11/16/14    a. 11/2014: admitted after discharge from STEMI admission, 18 beats 135-140bpm in ED.  Marland Kitchen. CAD (coronary artery disease)     a. 11/2014: arrest/STEMI s/p Xience DES to the LAD and PTCA to the D1 [place] b. Relook cath 11/2014: no culprit, patent stent.  Marland Kitchen. Dyspnea     a. 11/2014: possibly due to combo of CHF and Brilinta. Brilinta changed to Effient.  . Tobacco abuse   . RBBB   . Dyslipidemia   . Abnormal TSH     a. 11/2014: felt d/t sick euthyroid.  Marland Kitchen. Pericarditis dx'd 11/2014  . Dressler's syndrome     a. 11/2014: tx with colchicine and prednisone.  . Pericardial effusion     a. 11/2014.  Marland Kitchen. Erectile dysfunction     a. Pt aware not to take ED med within 24 hr of NTG and vice versa.  Marland Kitchen. AICD (automatic cardioverter/defibrillator) present 02/28/2015  . CHF (congestive heart failure)   . STEMI (ST elevation myocardial infarction) 11/10/2014  . Pneumonia 1990's X 2  . Sleep apnea     suspected but not diagnosed (02/28/2015)    Past Surgical History  Procedure Laterality Date  . Hand surgery Bilateral     Dupuytren's Contracture  . Knee arthroscopy Right 1982  . Left heart cath N/A 11/19/2014    Procedure: LEFT HEART CATH;  Surgeon: Runell GessJonathan J Berry, MD;  [facility];  Service: Cardiovascular;  Laterality: N/A;  . Cardiac defibrillator  placement  02/28/2015  . Coronary angioplasty with stent placement  11/10/2014    LAD DES  . Cardiac catheterization  11/19/2014  . Implantable cardioverter defibrillator implant N/A 02/28/2015    Procedure: IMPLANTABLE CARDIOVERTER DEFIBRILLATOR IMPLANT;  Surgeon: Hillis Range, MD;  Location: [facility] CATH LAB;  Service: Cardiovascular;  Laterality: N/A;    Current Outpatient Prescriptions  Medication Sig Dispense Refill  . acetaminophen (TYLENOL) 325 MG tablet Take 1-2 tablets (325-650 mg total) by mouth every 4 (four) hours as needed for mild pain.    Marland Kitchen aspirin EC 81 MG  tablet Take 81 mg by mouth daily.    Marland Kitchen atorvastatin (LIPITOR) 20 MG tablet Take 1 tablet (20 mg total) by mouth daily.    . carvedilol (COREG) 12.5 MG tablet Take 1.5 tablets (18.75 mg total) by mouth 2 (two) times daily with a meal. (Patient taking differently: Take 12.5 mg by mouth 2 (two) times daily with a meal. ) 90 tablet 5  . clopidogrel (PLAVIX) 75 MG tablet Take 1 tablet (75 mg total) by mouth daily. 90 tablet 3  . colchicine 0.6 MG tablet Take 1 tablet (0.6 mg total) by mouth 2 (two) times daily. 60 tablet 4  . furosemide (LASIX) 40 MG tablet Take 1 tablet (40 mg total) by mouth daily. 60 tablet 5  . nitroGLYCERIN (NITROSTAT) 0.4 MG SL tablet Place 1 tablet (0.4 mg total) under the tongue every 5 (five) minutes as needed for chest pain. (Patient taking differently: Place 0.4 mg under the tongue every 5 (five) minutes as needed for chest pain (MAX 3 TABLETS). )    . spironolactone (ALDACTONE) 25 MG tablet Take 1 tablet (25 mg total) by mouth daily. 90 tablet 3   No current facility-administered medications for this encounter.    Allergies:   Ambien; Lisinopril; and Brilinta   Social History:  The patient  reports that he quit smoking about 3 months ago. His smoking use included Cigarettes. He has a 80 pack-year smoking history. He has never used smokeless tobacco. He reports that he drinks about 8.4 oz of alcohol per week. He reports that he does not use illicit drugs.   Family History:  The patient's  family history includes COPD in his mother; Hypertension in his father and mother. There is no history of CAD.    ROS:  Please see the history of present illness.  Otherwise, review of systems is positive for shortness of breath.  All other systems are reviewed and negative.    PHYSICAL EXAM: VS:  BP 109/73 mmHg  Pulse 71  Resp 18  Wt 189 lb 8 oz (85.957 kg)  SpO2 99% , BMI Body mass index is 23.69 kg/(m^2). GEN: Well nourished, well developed, in no acute distress HEENT:  normal Neck: no JVD, no masses, no carotid bruits Cardiac: RRR without murmur or gallop. ICD site looks good.             Respiratory:  clear to auscultation bilaterally, normal work of breathing GI: soft, nontender, nondistended, + BS MS: no deformity or atrophy Ext: no pretibial edema Skin: warm and dry, no rash Neuro:  Strength and sensation are intact Psych: euthymic mood, full affect   Recent Labs: 11/24/2014: Magnesium 2.4 01/29/2015: ALT 47; TSH 5.75* 02/07/2015: Pro B Natriuretic peptide (BNP) 1442.0* 02/26/2015: Hemoglobin 13.9; Platelets 271.0 03/01/2015: BUN 17; Creatinine 1.11; Potassium 3.9; Sodium 138   Lipid Panel     Component Value Date/Time   CHOL 108 01/08/2015 0850  CHOL 101 12/04/2014 1030   TRIG 155.0* 01/08/2015 0850   HDL 39.00* 01/08/2015 0850   HDL 43 12/04/2014 1030   CHOLHDL 3 01/08/2015 0850   CHOLHDL 2.3 12/04/2014 1030   VLDL 31.0 01/08/2015 0850   LDLCALC 38 01/08/2015 0850   LDLCALC 39 12/04/2014 1030      Wt Readings from Last 3 Encounters:  03/01/15 190 lb 0.6 oz (86.2 kg)  02/26/15 191 lb 9.6 oz (86.909 kg)  01/29/15 193 lb 12.8 oz (87.907 kg)     Cardiac Studies Reviewed: 2D Echo 11/13/2014: Study Conclusions  - Left ventricle: The cavity size was normal. Wall thickness was normal. Systolic function was severely reduced. The estimated ejection fraction was in the range of 20% to 25%. Diastolic dysfunction with elevated LV Filling pressure. Diffuse hypokinesis. - Mitral valve: Mildly thickened leaflets . There was mild regurgitation. - Left atrium: The atrium was at the upper limits of normal in size. - Right atrium: The atrium was mildly dilated. - Pericardium, extracardiac: Small circumferential pericardial effusion. Tamponade physiology is not suspect, but cannot be ruled-out on the basis of this study.  Impressions:  - Compared to the prior echo in 11/2014, there is now a small circumferential  pericardial effusion - no clear tamponade physiology, however, the IVC is dilated. There has been no improvement in LVEF.  ASSESSMENT AND PLAN:  1. Chronic systolic due to ischemic CM - HF EF 25%. med Rx has been limited because of symptomatic hypotension. --Currently NYHA I-II. Continues to improve. --Volume status looks good.  --Continue current meds. Can start cutting back lasix as needed and using on a more prn basis.  --Start losartan 12.5 qhs --Continue cardiac rehab. Spoke with EP NP. Ok to return to CR next week after wound check.  --Will reschedule CPX for 3 months.   2.  CAD s/p Anterior MI December 2015:  --No recurrent angina. Cardiac rehab flowsheets reviewed. He will continue on ASA, statin and Effient per Dr. Excell Seltzer  3. Dressler's syndrome --Resolved. Off prednisone. Continue colchicine x 6 months  4. Tobacco abuse:  --complete smoking cessation now > 3 months (quit at time of MI).   Arvilla Meres, MD  03/08/2015 3:18 PM

## 2015-03-09 ENCOUNTER — Encounter (HOSPITAL_COMMUNITY): Payer: Medicare Other

## 2015-03-12 ENCOUNTER — Encounter (HOSPITAL_COMMUNITY): Payer: Medicare Other

## 2015-03-12 ENCOUNTER — Ambulatory Visit: Payer: Medicare Other

## 2015-03-13 ENCOUNTER — Encounter (HOSPITAL_COMMUNITY): Payer: Medicare Other

## 2015-03-14 ENCOUNTER — Encounter (HOSPITAL_COMMUNITY): Payer: Medicare Other

## 2015-03-16 ENCOUNTER — Encounter (HOSPITAL_COMMUNITY): Payer: Medicare Other

## 2015-03-16 ENCOUNTER — Ambulatory Visit (INDEPENDENT_AMBULATORY_CARE_PROVIDER_SITE_OTHER): Payer: Medicare Other | Admitting: *Deleted

## 2015-03-16 DIAGNOSIS — I5022 Chronic systolic (congestive) heart failure: Secondary | ICD-10-CM

## 2015-03-16 DIAGNOSIS — I472 Ventricular tachycardia: Secondary | ICD-10-CM

## 2015-03-16 DIAGNOSIS — I255 Ischemic cardiomyopathy: Secondary | ICD-10-CM | POA: Diagnosis not present

## 2015-03-16 DIAGNOSIS — I4729 Other ventricular tachycardia: Secondary | ICD-10-CM

## 2015-03-16 LAB — MDC_IDC_ENUM_SESS_TYPE_INCLINIC
Battery Remaining Longevity: 106.8 mo
HighPow Impedance: 57.375
Implantable Pulse Generator Serial Number: 7255600
Lead Channel Pacing Threshold Amplitude: 0.5 V
Lead Channel Pacing Threshold Pulse Width: 0.5 ms
Lead Channel Sensing Intrinsic Amplitude: 8.3 mV
Lead Channel Setting Pacing Pulse Width: 0.5 ms
Lead Channel Setting Sensing Sensitivity: 0.5 mV
MDC IDC MSMT LEADCHNL RV IMPEDANCE VALUE: 387.5 Ohm
MDC IDC MSMT LEADCHNL RV PACING THRESHOLD AMPLITUDE: 0.5 V
MDC IDC MSMT LEADCHNL RV PACING THRESHOLD PULSEWIDTH: 0.5 ms
MDC IDC SESS DTM: 20160408161136
MDC IDC SET LEADCHNL RV PACING AMPLITUDE: 3.5 V
MDC IDC SET ZONE DETECTION INTERVAL: 250 ms
MDC IDC SET ZONE DETECTION INTERVAL: 300 ms
MDC IDC STAT BRADY RV PERCENT PACED: 0.11 %
Zone Setting Detection Interval: 350 ms

## 2015-03-16 NOTE — Progress Notes (Signed)
Wound check appointment. Steri-strips removed. Wound without redness or edema. Incision edges approximated, wound well healed. Normal device function. Threshold, sensing, and impedances consistent with implant measurements. Device programmed at 3.5V for extra safety margin until 3 month visit. Histogram distribution appropriate for patient and level of activity. No ventricular arrhythmias noted. Patient educated about wound care, arm mobility, lifting restrictions, shock plan. ROV in 3 months with JA. 

## 2015-03-19 ENCOUNTER — Encounter (HOSPITAL_COMMUNITY): Payer: Medicare Other

## 2015-03-21 ENCOUNTER — Encounter (HOSPITAL_COMMUNITY)
Admission: RE | Admit: 2015-03-21 | Discharge: 2015-03-21 | Disposition: A | Discharge status: Home / Self Care | Payer: Medicare Other | Source: Ambulatory Visit | Attending: Cardiovascular Disease | Admitting: Cardiovascular Disease

## 2015-03-21 ENCOUNTER — Encounter (HOSPITAL_COMMUNITY): Payer: Medicare Other

## 2015-03-21 ENCOUNTER — Encounter: Payer: Self-pay | Admitting: Cardiovascular Disease

## 2015-03-21 ENCOUNTER — Ambulatory Visit (INDEPENDENT_AMBULATORY_CARE_PROVIDER_SITE_OTHER): Payer: Medicare Other | Admitting: Cardiovascular Disease

## 2015-03-21 ENCOUNTER — Other Ambulatory Visit (INDEPENDENT_AMBULATORY_CARE_PROVIDER_SITE_OTHER): Payer: Medicare Other | Admitting: *Deleted

## 2015-03-21 VITALS — BP 104/62 | HR 58 | Ht 75.0 in | Wt 191.0 lb

## 2015-03-21 DIAGNOSIS — I5022 Chronic systolic (congestive) heart failure: Secondary | ICD-10-CM

## 2015-03-21 DIAGNOSIS — Z955 Presence of coronary angioplasty implant and graft: Secondary | ICD-10-CM | POA: Insufficient documentation

## 2015-03-21 DIAGNOSIS — I241 Dressler's syndrome: Secondary | ICD-10-CM | POA: Diagnosis not present

## 2015-03-21 DIAGNOSIS — I319 Disease of pericardium, unspecified: Secondary | ICD-10-CM | POA: Insufficient documentation

## 2015-03-21 DIAGNOSIS — I10 Essential (primary) hypertension: Secondary | ICD-10-CM | POA: Diagnosis not present

## 2015-03-21 DIAGNOSIS — I252 Old myocardial infarction: Secondary | ICD-10-CM | POA: Diagnosis not present

## 2015-03-21 DIAGNOSIS — Z48812 Encounter for surgical aftercare following surgery on the circulatory system: Secondary | ICD-10-CM | POA: Diagnosis not present

## 2015-03-21 DIAGNOSIS — Z79899 Other long term (current) drug therapy: Secondary | ICD-10-CM | POA: Diagnosis not present

## 2015-03-21 LAB — BASIC METABOLIC PANEL
BUN: 27 mg/dL — AB (ref 6–23)
CALCIUM: 9.8 mg/dL (ref 8.4–10.5)
CO2: 29 mEq/L (ref 19–32)
Chloride: 98 mEq/L (ref 96–112)
Creatinine, Ser: 1.15 mg/dL (ref 0.40–1.50)
GFR: 67.79 mL/min (ref >60.00)
GLUCOSE: 91 mg/dL (ref 70–99)
Potassium: 4.1 mEq/L (ref 3.5–5.1)
Sodium: 136 mEq/L (ref 135–145)

## 2015-03-21 NOTE — Progress Notes (Signed)
Cardiology Office Note   Date:  03/21/2015   ID:  Kyle Bullock, DOB June 11, 1951, MRN [id number]  PCP:  No PCP Per Patient  Cardiologist:  Tonny BollmanMichael Kaliyah Gladman, MD    Chief Complaint  Patient presents with  . Coronary Artery Disease     History of Present Illness: Kyle MuttonJonathan D Bullock is a 64 y.o. male who presents for follow-up evaluation. In December 2015 large anterolateral MI complicated by VF arrest in [place]. The patient was discharged with a LifeVest. 90 day echocardiogram was recommended and LV function remained severely depressed. He underwent ICD implantation 02/28/2015. He's been evaluated in the advanced heart failure clinic. His medical therapy has been limited because of symptomatic hypotension, but he has tolerated the addition of low-dose losartan at bedtime and continues to take carvedilol.   Overall his breathing is improved. He feels like the most recent medication changes have really helped him. He started back with cardiac rehabilitation today after a short hiatus related to his ICD implantation. He felt good with exercise. He has mild dyspnea with moderate level activities. He denies resting chest pain or shortness of breath. He has no shortness of breath with his routine daily activities. He denies edema, orthopnea, or PND. He does have occasional dizziness.  Past Medical History  Diagnosis Date  . Hypertension   . Ischemic cardiomyopathy     a. 11/2014: EF reportedly 20% by cath and echo in [place].  . VF (ventricular fibrillation) 11/10/14    a. 11/2014: arrest with STEMI.  Marland Kitchen. NSVT (nonsustained ventricular tachycardia) 11/16/14    a. 11/2014: admitted after discharge from STEMI admission, 18 beats 135-140bpm in ED.  Marland Kitchen. CAD (coronary artery disease)     a. 11/2014: arrest/STEMI s/p Xience DES to the LAD and PTCA to the D1 [place] b. Relook cath 11/2014: no culprit, patent stent.  Marland Kitchen. Dyspnea     a. 11/2014: possibly due to combo of CHF and Brilinta.  Brilinta changed to Effient.  . Tobacco abuse   . RBBB   . Dyslipidemia   . Abnormal TSH     a. 11/2014: felt d/t sick euthyroid.  Marland Kitchen. Pericarditis dx'd 11/2014  . Dressler's syndrome     a. 11/2014: tx with colchicine and prednisone.  . Pericardial effusion     a. 11/2014.  Marland Kitchen. Erectile dysfunction     a. Pt aware not to take ED med within 24 hr of NTG and vice versa.  Marland Kitchen. AICD (automatic cardioverter/defibrillator) present 02/28/2015  . CHF (congestive heart failure)   . STEMI (ST elevation myocardial infarction) 11/10/2014  . Pneumonia 1990's X 2  . Sleep apnea     suspected but not diagnosed (02/28/2015)    Past Surgical History  Procedure Laterality Date  . Hand surgery Bilateral     Dupuytren's Contracture  . Knee arthroscopy Right 1982  . Left heart cath N/A 11/19/2014    Procedure: LEFT HEART CATH;  Surgeon: Runell GessJonathan J Berry, MD;  [facility];  Service: Cardiovascular;  Laterality: N/A;  . Cardiac defibrillator placement  02/28/2015  . Coronary angioplasty with stent placement  11/10/2014    LAD DES  . Cardiac catheterization  11/19/2014  . Implantable cardioverter defibrillator implant N/A 02/28/2015    Procedure: IMPLANTABLE CARDIOVERTER DEFIBRILLATOR IMPLANT;  Surgeon: Hillis RangeJames Allred, MD;  [facility];  Service: Cardiovascular;  Laterality: N/A;    Current Outpatient Prescriptions  Medication Sig Dispense Refill  . aspirin EC 81 MG tablet Take 81  mg by mouth daily.    Marland Kitchen atorvastatin (LIPITOR) 20 MG tablet Take 1 tablet (20 mg total) by mouth daily.    . carvedilol (COREG) 12.5 MG tablet Take 12.5 mg by mouth 2 (two) times daily with a meal.   2  . clopidogrel (PLAVIX) 75 MG tablet Take 1 tablet (75 mg total) by mouth daily. 90 tablet 3  . colchicine 0.6 MG tablet Take 1 tablet (0.6 mg total) by mouth 2 (two) times daily. 60 tablet 4  . furosemide (LASIX) 40 MG tablet Take 1 tablet (40 mg total) by mouth daily. 60 tablet 5  . losartan (COZAAR) 25 MG tablet  Take 0.5 tablets (12.5 mg total) by mouth daily. 45 tablet 3  . nitroGLYCERIN (NITROSTAT) 0.4 MG SL tablet Place 0.4 mg under the tongue every 5 (five) minutes as needed for chest pain.    Marland Kitchen spironolactone (ALDACTONE) 25 MG tablet Take 1 tablet (25 mg total) by mouth daily. 90 tablet 3   No current facility-administered medications for this visit.    Allergies:   Ambien; Lisinopril; and Brilinta   Social History:  The patient  reports that he quit smoking about 4 months ago. His smoking use included Cigarettes. He has a 80 pack-year smoking history. He has never used smokeless tobacco. He reports that he drinks about 8.4 oz of alcohol per week. He reports that he does not use illicit drugs.   Family History:  The patient's  family history includes COPD in his mother; Hypertension in his father and mother. There is no history of CAD.    ROS:  Please see the history of present illness.   All other systems are reviewed and negative.    PHYSICAL EXAM: VS:  BP 104/62 mmHg  Pulse 58  Ht  (1.905 m)  Wt 191 lb (86.637 kg)  BMI 23.87 kg/m2  SpO2 98% , BMI Body mass index is 23.87 kg/(m^2). GEN: Well nourished, well developed, in no acute distress HEENT: normal Neck: no JVD, no masses. No carotid bruits Cardiac: RRR without murmur or gallop                Respiratory:  clear to auscultation bilaterally, normal work of breathing GI: soft, nontender, nondistended, + BS MS: no deformity or atrophy Ext: no pretibial edema, pedal pulses 2+= bilaterally Skin: warm and dry, no rash Neuro:  Strength and sensation are intact Psych: euthymic mood, full affect  EKG:  EKG is not ordered today.  Recent Labs: 11/24/2014: Magnesium 2.4 01/29/2015: ALT 47; TSH 5.75* 02/07/2015: Pro B Natriuretic peptide (BNP) 1442.0* 02/26/2015: Hemoglobin 13.9; Platelets 271.0 03/21/2015: BUN 27*; Creatinine 1.15; Potassium 4.1; Sodium 136   Lipid Panel     Component Value Date/Time   CHOL 108 01/08/2015 0850     CHOL 101 12/04/2014 1030   TRIG 155.0* 01/08/2015 0850   HDL 39.00* 01/08/2015 0850   HDL 43 12/04/2014 1030   CHOLHDL 3 01/08/2015 0850   CHOLHDL 2.3 12/04/2014 1030   VLDL 31.0 01/08/2015 0850   LDLCALC 38 01/08/2015 0850   LDLCALC 39 12/04/2014 1030      Wt Readings from Last 3 Encounters:  03/21/15 191 lb (86.637 kg)  03/01/15 190 lb 0.6 oz (86.2 kg)  02/26/15 191 lb 9.6 oz (86.909 kg)     Cardiac Studies Reviewed: 2-D echocardiogram 02/12/2015: Study Conclusions  - Left ventricle: The cavity size was mildly dilated. Wall thickness was normal. Systolic function was severely reduced. The estimated ejection fraction  was in the range of 25% to 30%. Global hypokinesis with anterior and apical severe hypokinesis. The study is not technically sufficient to allow evaluation of LV diastolic function. - Mitral valve: Mildly thickened leaflets . There was mild to moderate regurgitation. - Left atrium: The atrium was mildly dilated at 37 ml/m2.  Impressions:  - Compared to the prior echo in 01/2014, the EF may be slightly better around 25-30%, still with anterior wall motion abnormalities. No pericardial effusion.   ASSESSMENT AND PLAN: 1.  Chronic systolic heart failure, New York Heart Association functional class II. This is related to severe ischemic cardiomyopathy after large anterior wall MI. The patient has stabilized on a combination of carvedilol, spironolactone, and low-dose losartan. He is scheduled to see Dr. Gala Romney in 2 weeks in the advanced heart failure clinic. I did not make a medication changes today, but suspect his losartan will be able to be increased. He has just started back to cardiac rehabilitation today, so we should have better blood pressure data a few weeks. His neck veins are flat today. Lung fields are completely clear. He was quite sick a few months ago after his MI with weight loss and failure to thrive, and I suspect he is  currently close to his dry weight at 190 pounds.  2. CAD, native vessel: No anginal symptoms. He continues on dual antiplatelet therapy with aspirin and clopidogrel. He was unable to tolerate brilinta because of shortness of breath.  3. Hyperlipidemia: The patient takes atorvastatin 20 mg daily. Recent lipids showed an LDL cholesterol of 38.  4. Status post ICD. He will be followed by Dr. Johney Frame.  Current medicines are reviewed with the patient today.  The patient does not have concerns regarding medicines.  The following changes have been made:  no change  Labs/ tests ordered today include:  No orders of the defined types were placed in this encounter.   Disposition:   FU 6 months  Signed, Tonny Bollman, MD  03/21/2015 10:56 PM    [facility] [address] [place], [place], [place]  [address] Phone: [phone]; Fax: [phone]

## 2015-03-21 NOTE — Patient Instructions (Signed)
Your physician recommends that you continue on your current medications as directed. Please refer to the Current Medication list given to you today.  Your physician wants you to follow-up in: 6 MONTHS with Dr Cooper.  You will receive a reminder letter in the mail two months in advance. If you don't receive a letter, please call our office to schedule the follow-up appointment.  

## 2015-03-23 ENCOUNTER — Encounter (HOSPITAL_COMMUNITY)
Admission: RE | Admit: 2015-03-23 | Discharge: 2015-03-23 | Disposition: A | Discharge status: Home / Self Care | Payer: Medicare Other | Source: Ambulatory Visit | Attending: Cardiovascular Disease | Admitting: Cardiovascular Disease

## 2015-03-23 ENCOUNTER — Encounter (HOSPITAL_COMMUNITY): Payer: Medicare Other

## 2015-03-23 DIAGNOSIS — I252 Old myocardial infarction: Secondary | ICD-10-CM | POA: Diagnosis not present

## 2015-03-23 DIAGNOSIS — Z79899 Other long term (current) drug therapy: Secondary | ICD-10-CM | POA: Diagnosis not present

## 2015-03-23 DIAGNOSIS — I319 Disease of pericardium, unspecified: Secondary | ICD-10-CM | POA: Diagnosis not present

## 2015-03-23 DIAGNOSIS — Z48812 Encounter for surgical aftercare following surgery on the circulatory system: Secondary | ICD-10-CM | POA: Diagnosis not present

## 2015-03-23 DIAGNOSIS — Z955 Presence of coronary angioplasty implant and graft: Secondary | ICD-10-CM | POA: Diagnosis not present

## 2015-03-23 DIAGNOSIS — I241 Dressler's syndrome: Secondary | ICD-10-CM | POA: Diagnosis not present

## 2015-03-26 ENCOUNTER — Encounter (HOSPITAL_COMMUNITY): Payer: Medicare Other

## 2015-03-26 ENCOUNTER — Encounter (HOSPITAL_COMMUNITY)
Admission: RE | Admit: 2015-03-26 | Discharge: 2015-03-26 | Disposition: A | Discharge status: Home / Self Care | Payer: Medicare Other | Source: Ambulatory Visit | Attending: Cardiovascular Disease | Admitting: Cardiovascular Disease

## 2015-03-26 DIAGNOSIS — Z79899 Other long term (current) drug therapy: Secondary | ICD-10-CM | POA: Diagnosis not present

## 2015-03-26 DIAGNOSIS — I252 Old myocardial infarction: Secondary | ICD-10-CM | POA: Diagnosis not present

## 2015-03-26 DIAGNOSIS — Z955 Presence of coronary angioplasty implant and graft: Secondary | ICD-10-CM | POA: Diagnosis not present

## 2015-03-26 DIAGNOSIS — I319 Disease of pericardium, unspecified: Secondary | ICD-10-CM | POA: Diagnosis not present

## 2015-03-26 DIAGNOSIS — Z48812 Encounter for surgical aftercare following surgery on the circulatory system: Secondary | ICD-10-CM | POA: Diagnosis not present

## 2015-03-26 DIAGNOSIS — I241 Dressler's syndrome: Secondary | ICD-10-CM | POA: Diagnosis not present

## 2015-03-28 ENCOUNTER — Encounter: Payer: Self-pay | Admitting: Internal Medicine

## 2015-03-28 ENCOUNTER — Encounter (HOSPITAL_COMMUNITY)
Admission: RE | Admit: 2015-03-28 | Discharge: 2015-03-28 | Disposition: A | Discharge status: Home / Self Care | Payer: Medicare Other | Source: Ambulatory Visit | Attending: Cardiovascular Disease | Admitting: Cardiovascular Disease

## 2015-03-28 ENCOUNTER — Encounter (HOSPITAL_COMMUNITY): Payer: Medicare Other

## 2015-03-28 DIAGNOSIS — I241 Dressler's syndrome: Secondary | ICD-10-CM | POA: Diagnosis not present

## 2015-03-28 DIAGNOSIS — I319 Disease of pericardium, unspecified: Secondary | ICD-10-CM | POA: Diagnosis not present

## 2015-03-28 DIAGNOSIS — Z48812 Encounter for surgical aftercare following surgery on the circulatory system: Secondary | ICD-10-CM | POA: Diagnosis not present

## 2015-03-28 DIAGNOSIS — I252 Old myocardial infarction: Secondary | ICD-10-CM | POA: Diagnosis not present

## 2015-03-28 DIAGNOSIS — Z79899 Other long term (current) drug therapy: Secondary | ICD-10-CM | POA: Diagnosis not present

## 2015-03-28 DIAGNOSIS — Z955 Presence of coronary angioplasty implant and graft: Secondary | ICD-10-CM | POA: Diagnosis not present

## 2015-03-30 ENCOUNTER — Encounter (HOSPITAL_COMMUNITY): Payer: Medicare Other

## 2015-03-30 ENCOUNTER — Encounter (HOSPITAL_COMMUNITY)
Admission: RE | Admit: 2015-03-30 | Discharge: 2015-03-30 | Disposition: A | Discharge status: Home / Self Care | Payer: Medicare Other | Source: Ambulatory Visit | Attending: Cardiovascular Disease | Admitting: Cardiovascular Disease

## 2015-03-30 DIAGNOSIS — Z955 Presence of coronary angioplasty implant and graft: Secondary | ICD-10-CM | POA: Diagnosis not present

## 2015-03-30 DIAGNOSIS — I252 Old myocardial infarction: Secondary | ICD-10-CM | POA: Diagnosis not present

## 2015-03-30 DIAGNOSIS — Z79899 Other long term (current) drug therapy: Secondary | ICD-10-CM | POA: Diagnosis not present

## 2015-03-30 DIAGNOSIS — I241 Dressler's syndrome: Secondary | ICD-10-CM | POA: Diagnosis not present

## 2015-03-30 DIAGNOSIS — I319 Disease of pericardium, unspecified: Secondary | ICD-10-CM | POA: Diagnosis not present

## 2015-03-30 DIAGNOSIS — Z48812 Encounter for surgical aftercare following surgery on the circulatory system: Secondary | ICD-10-CM | POA: Diagnosis not present

## 2015-04-02 ENCOUNTER — Encounter (HOSPITAL_COMMUNITY): Payer: Medicare Other

## 2015-04-02 ENCOUNTER — Encounter (HOSPITAL_COMMUNITY)
Admission: RE | Admit: 2015-04-02 | Discharge: 2015-04-02 | Disposition: A | Discharge status: Home / Self Care | Payer: Medicare Other | Source: Ambulatory Visit | Attending: Cardiovascular Disease | Admitting: Cardiovascular Disease

## 2015-04-02 DIAGNOSIS — Z955 Presence of coronary angioplasty implant and graft: Secondary | ICD-10-CM | POA: Diagnosis not present

## 2015-04-02 DIAGNOSIS — I252 Old myocardial infarction: Secondary | ICD-10-CM | POA: Diagnosis not present

## 2015-04-02 DIAGNOSIS — I241 Dressler's syndrome: Secondary | ICD-10-CM | POA: Diagnosis not present

## 2015-04-02 DIAGNOSIS — I319 Disease of pericardium, unspecified: Secondary | ICD-10-CM | POA: Diagnosis not present

## 2015-04-02 DIAGNOSIS — Z48812 Encounter for surgical aftercare following surgery on the circulatory system: Secondary | ICD-10-CM | POA: Diagnosis not present

## 2015-04-02 DIAGNOSIS — Z79899 Other long term (current) drug therapy: Secondary | ICD-10-CM | POA: Diagnosis not present

## 2015-04-04 ENCOUNTER — Telehealth: Payer: Self-pay | Admitting: Cardiovascular Disease

## 2015-04-04 ENCOUNTER — Encounter (HOSPITAL_COMMUNITY)
Admission: RE | Admit: 2015-04-04 | Discharge: 2015-04-04 | Disposition: A | Discharge status: Home / Self Care | Payer: Medicare Other | Source: Ambulatory Visit | Attending: Cardiovascular Disease | Admitting: Cardiovascular Disease

## 2015-04-04 DIAGNOSIS — Z955 Presence of coronary angioplasty implant and graft: Secondary | ICD-10-CM | POA: Diagnosis not present

## 2015-04-04 DIAGNOSIS — I319 Disease of pericardium, unspecified: Secondary | ICD-10-CM | POA: Diagnosis not present

## 2015-04-04 DIAGNOSIS — Z79899 Other long term (current) drug therapy: Secondary | ICD-10-CM | POA: Diagnosis not present

## 2015-04-04 DIAGNOSIS — I241 Dressler's syndrome: Secondary | ICD-10-CM | POA: Diagnosis not present

## 2015-04-04 DIAGNOSIS — Z48812 Encounter for surgical aftercare following surgery on the circulatory system: Secondary | ICD-10-CM | POA: Diagnosis not present

## 2015-04-04 DIAGNOSIS — I252 Old myocardial infarction: Secondary | ICD-10-CM | POA: Diagnosis not present

## 2015-04-04 NOTE — Telephone Encounter (Signed)
New message     Patient calling     1. What dental office are you calling from? Patient calling   2. What is your office phone and fax number? Dr. Darnelle Boseid Clark / patient did not have phone number on hand.    3. What type of procedure is the patient having performed? Routine cleaning   4. What date is procedure scheduled? 4.28.2016 @ 10 am    5. What is your question (ex. Antibiotics prior to procedure, holding medication-we need to know how long dentist wants pt to hold med)? Md is asking does he need any antibiotics

## 2015-04-04 NOTE — Telephone Encounter (Signed)
I spoke with the pt and made him aware that he does not need SBE.

## 2015-04-05 ENCOUNTER — Telehealth: Payer: Self-pay | Admitting: Internal Medicine

## 2015-04-05 ENCOUNTER — Telehealth (HOSPITAL_COMMUNITY): Payer: Self-pay | Admitting: *Deleted

## 2015-04-05 ENCOUNTER — Ambulatory Visit (HOSPITAL_COMMUNITY)
Admission: RE | Admit: 2015-04-05 | Discharge: 2015-04-05 | Disposition: A | Discharge status: Home / Self Care | Payer: Medicare Other | Source: Ambulatory Visit | Attending: Internal Medicine | Admitting: Internal Medicine

## 2015-04-05 ENCOUNTER — Encounter (HOSPITAL_COMMUNITY): Payer: Self-pay

## 2015-04-05 VITALS — BP 100/48 | HR 82 | Wt 195.0 lb

## 2015-04-05 DIAGNOSIS — Z9861 Coronary angioplasty status: Secondary | ICD-10-CM | POA: Diagnosis not present

## 2015-04-05 DIAGNOSIS — I2510 Atherosclerotic heart disease of native coronary artery without angina pectoris: Secondary | ICD-10-CM | POA: Diagnosis not present

## 2015-04-05 DIAGNOSIS — I241 Dressler's syndrome: Secondary | ICD-10-CM | POA: Diagnosis not present

## 2015-04-05 DIAGNOSIS — I5022 Chronic systolic (congestive) heart failure: Secondary | ICD-10-CM | POA: Diagnosis not present

## 2015-04-05 MED ORDER — FUROSEMIDE 40 MG PO TABS: 40.0000 mg | ORAL_TABLET | Freq: Every day | ORAL | Status: DC | PRN

## 2015-04-05 MED ORDER — LOSARTAN POTASSIUM 25 MG PO TABS
25.0000 mg | ORAL_TABLET | Freq: Every day | ORAL | Status: DC
Start: 2015-04-05 — End: 2016-04-11

## 2015-04-05 NOTE — Telephone Encounter (Signed)
New problem   Pt is having a tooth cleaning today at 10am and because pt has a defib office need to know if they can use there restrictide ultrasonic, please advise

## 2015-04-05 NOTE — Progress Notes (Signed)
Patient ID: Kyle Bullock, male   DOB: 1950/12/20, 64 y.o.   MRN: [id number]    Advanced HF Clinic Note   History of Present Illness: Kyle Bullock is a 64 y.o. male who is referred to the HF Clinic by Dr. Excell Seltzer. He has h/o CAD and ischemic cardiomyopathy 20-25%.  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    In December 2015, he suffered an acute anterolateral MI complicated by VF arrest in [place]. He underwent drug-eluting stent placement to the LAD and PTCA of the diagonal. EF was 25% and a life vest was placed prior to discharge. Following return to [place], he developed dyspnea and presented to [facility] where he was readmitted. His Brilinta was switched to Effient as it was felt the Brilinta may have been playing a role in his dyspnea. He was also switched from an ACE inhibitor to an ARB. He was seen by electrophysiology with recommendation for ongoing life vest therapy and follow-up echo in 90 days post revascularization. He was then seen in clinic in mid December where he reported pleuritic chest pain. He was readmitted and underwent diagnostic catheterization revealing patency of previously placed stent and area of balloon angioplasty in the diagonal. EF was 20-25% with diffuse hypokinesis and normal pericardial appearance. He did have some hypotension requiring discontinuation of ARB therapy. His beta blocker was also down titrated. He was diagnosed with Dressler's syndrome and was placed on prednisone taper and colchicine.  He returns for heart failure follow up. Since the last visit he had ICD placement. Overall doing well. Denies SOB/PND/Orthopnea. Able to walk 2-2 1/2 miles per day. Attends cardiac rehab 3 times a week. Weight at home 188-189 pounds. Taking all medications. Following low salt and low fat diet.       Past Medical History  Diagnosis Date  . Hypertension   . Ischemic cardiomyopathy     a. 11/2014: EF reportedly 20% by cath and echo in  [place].  . VF (ventricular fibrillation) 11/10/14    a. 11/2014: arrest with STEMI.  Marland Kitchen NSVT (nonsustained ventricular tachycardia) 11/16/14    a. 11/2014: admitted after discharge from STEMI admission, 18 beats 135-140bpm in ED.  Marland Kitchen CAD (coronary artery disease)     a. 11/2014: arrest/STEMI s/p Xience DES to the LAD and PTCA to the D1 [place], [place]). b. Relook cath 11/2014: no culprit, patent stent.  Marland Kitchen Dyspnea     a. 11/2014: possibly due to combo of CHF and Brilinta. Brilinta changed to Effient.  . Tobacco abuse   . RBBB   . Dyslipidemia   . Abnormal TSH     a. 11/2014: felt d/t sick euthyroid.  Marland Kitchen Pericarditis dx'd 11/2014  . Dressler's syndrome     a. 11/2014: tx with colchicine and prednisone.  . Pericardial effusion     a. 11/2014.  Marland Kitchen Erectile dysfunction     a. Pt aware not to take ED med within 24 hr of NTG and vice versa.  Marland Kitchen AICD (automatic cardioverter/defibrillator) present 02/28/2015  . CHF (congestive heart failure)   . STEMI (ST elevation myocardial infarction) 11/10/2014  . Pneumonia 1990's X 2  . Sleep apnea     suspected but not diagnosed (02/28/2015)    Past Surgical History  Procedure Laterality Date  . Hand surgery Bilateral     Dupuytren's Contracture  . Knee arthroscopy Right 1982  . Left heart cath N/A 11/19/2014    Procedure: LEFT HEART CATH;  Surgeon: Christiane Ha  Erlene QuanJ Berry, MD;  Location: [facility] CATH LAB;  Service: Cardiovascular;  Laterality: N/A;  . Cardiac defibrillator placement  02/28/2015  . Coronary angioplasty with stent placement  11/10/2014    LAD DES  . Cardiac catheterization  11/19/2014  . Implantable cardioverter defibrillator implant N/A 02/28/2015    Procedure: IMPLANTABLE CARDIOVERTER DEFIBRILLATOR IMPLANT;  Surgeon: Hillis RangeJames Allred, MD;  [facility];  Service: Cardiovascular;  Laterality: N/A;    Current Outpatient Prescriptions  Medication Sig Dispense Refill  . aspirin EC 81 MG tablet Take 81 mg by mouth daily.    Marland Kitchen. atorvastatin  (LIPITOR) 20 MG tablet Take 1 tablet (20 mg total) by mouth daily.    . carvedilol (COREG) 12.5 MG tablet Take 12.5 mg by mouth 2 (two) times daily with a meal.   2  . clopidogrel (PLAVIX) 75 MG tablet Take 1 tablet (75 mg total) by mouth daily. 90 tablet 3  . colchicine 0.6 MG tablet Take 1 tablet (0.6 mg total) by mouth 2 (two) times daily. 60 tablet 4  . furosemide (LASIX) 40 MG tablet Take 1 tablet (40 mg total) by mouth daily. 60 tablet 5  . losartan (COZAAR) 25 MG tablet Take 0.5 tablets (12.5 mg total) by mouth daily. 45 tablet 3  . nitroGLYCERIN (NITROSTAT) 0.4 MG SL tablet Place 0.4 mg under the tongue every 5 (five) minutes as needed for chest pain.    Marland Kitchen. spironolactone (ALDACTONE) 25 MG tablet Take 1 tablet (25 mg total) by mouth daily. 90 tablet 3   No current facility-administered medications for this encounter.    Allergies:   Ambien; Lisinopril; and Brilinta   Social History:  The patient  reports that he quit smoking about 4 months ago. His smoking use included Cigarettes. He has a 80 pack-year smoking history. He has never used smokeless tobacco. He reports that he drinks about 8.4 oz of alcohol per week. He reports that he does not use illicit drugs.   Family History:  The patient's  family history includes COPD in his mother; Hypertension in his father and mother. There is no history of CAD.    ROS:  Please see the history of present illness.  Otherwise, review of systems is positive for shortness of breath.  All other systems are reviewed and negative.    PHYSICAL EXAM: VS:  BP 100/48 mmHg  Pulse 82  Wt 195 lb (88.451 kg)  SpO2 96% , BMI Body mass index is 24.37 kg/(m^2).  GEN: Well nourished, well developed, in no acute distress HEENT: normal Neck: no JVD, no masses, no carotid bruits Cardiac: RRR without murmur or gallop. ICD site looks good.             Respiratory:  clear to auscultation bilaterally, normal work of breathing GI: soft, nontender, nondistended, +  BS MS: no deformity or atrophy Ext: no pretibial edema Skin: warm and dry, no rash Neuro:  Strength and sensation are intact Psych: euthymic mood, full affect   Recent Labs: 11/24/2014: Magnesium 2.4 01/29/2015: ALT 47; TSH 5.75* 02/07/2015: Pro B Natriuretic peptide (BNP) 1442.0* 02/26/2015: Hemoglobin 13.9; Platelets 271.0 03/21/2015: BUN 27*; Creatinine 1.15; Potassium 4.1; Sodium 136   Lipid Panel     Component Value Date/Time   CHOL 108 01/08/2015 0850   CHOL 101 12/04/2014 1030   TRIG 155.0* 01/08/2015 0850   HDL 39.00* 01/08/2015 0850   HDL 43 12/04/2014 1030   CHOLHDL 3 01/08/2015 0850   CHOLHDL 2.3 12/04/2014 1030   VLDL  31.0 01/08/2015 0850   LDLCALC 38 01/08/2015 0850   LDLCALC 39 12/04/2014 1030      Wt Readings from Last 3 Encounters:  04/05/15 195 lb (88.451 kg)  03/21/15 191 lb (86.637 kg)  03/01/15 190 lb 0.6 oz (86.2 kg)     Cardiac Studies Reviewed: 2D Echo 11/13/2014: Study Conclusions  - Left ventricle: The cavity size was normal. Wall thickness was normal. Systolic function was severely reduced. The estimated ejection fraction was in the range of 20% to 25%. Diastolic dysfunction with elevated LV Filling pressure. Diffuse hypokinesis. - Mitral valve: Mildly thickened leaflets . There was mild regurgitation. - Left atrium: The atrium was at the upper limits of normal in size. - Right atrium: The atrium was mildly dilated. - Pericardium, extracardiac: Small circumferential pericardial effusion. Tamponade physiology is not suspect, but cannot be ruled-out on the basis of this study.  Impressions:  - Compared to the prior echo in 11/2014, there is now a small circumferential pericardial effusion - no clear tamponade physiology, however, the IVC is dilated. There has been no improvement in LVEF.  ASSESSMENT AND PLAN:  1. Chronic systolic due to ischemic CM - HF EF 25-30% echo 3/16. med Rx has been limited because of  symptomatic hypotension. --Currently NYHA I-II. --Volume status looks good.  - Change lasix to as needed.  - Increase losartan to 25 daily - Continue carvedilol 12.5 mg twice a day  -Continue sprio 25 mg daily.   -Will reschedule CPX for 3 months.  -EF improving. NYHA I-II. Will hold off on ICD referral for now. Recheck EF 2-3 months. If still < 35% refer for ICD. 2.  CAD s/p Anterior MI December 2015:  --No recurrent angina. Cardiac rehab flowsheets reviewed. He will continue on ASA, statin and Effient per Dr. Excell Seltzer  3. Dressler's syndrome --Resolved. Off prednisone. Continue wean colchicine to off.  4. Tobacco abuse:  --complete smoking cessation now > 3 months (quit at time of MI).  Follow up in 6 weeks.    CLEGG,AMY, NP  04/05/2015 2:47 PM     Patient seen and examined with Tonye Becket, NP. We discussed all aspects of the encounter. I agree with the assessment and plan as stated above.   He is doing great. Will increase losartan. Wean colchicine. Hold on ICD for nw.  [name], Daniel,MD 11:51 PM

## 2015-04-05 NOTE — Telephone Encounter (Signed)
Spoke with staff at [facility]- advised it was fine to use the device but to avoid laying the cord across chest. Staff verbalized understanding.

## 2015-04-05 NOTE — Telephone Encounter (Signed)
Pt's wife called back to clarify orders for colchicine, discussed w/Dr [name] as his note says continue for 6 months and pt's wife felt like he said a week.  Per Dr Gala RomneyBensimhon pt should decrease to once daily and after a week if no longer having pain then can stop med, pt's wife aware and agreeable

## 2015-04-05 NOTE — Patient Instructions (Addendum)
CHANGE Lasix to 1 tablet AS NEEDED once daily for weight gain 3 lbs or more over night or 5 lbs or more per week.  INCREASE Losartan to 25mg  (1 tablet) daily at bedtime.  Return in 1-2 weeks for repeat labs.  Follow up 6 weeks for routine appointment.  Do the following things EVERYDAY: 1) Weigh yourself in the morning before breakfast. Write it down and keep it in a log. 2) Take your medicines as prescribed 3) Eat low salt foods-Limit salt (sodium) to 2000 mg per day.  4) Stay as active as you can everyday 5) Limit all fluids for the day to less than 2 liters

## 2015-04-06 ENCOUNTER — Encounter (HOSPITAL_COMMUNITY)
Admission: RE | Admit: 2015-04-06 | Discharge: 2015-04-06 | Disposition: A | Discharge status: Home / Self Care | Payer: Medicare Other | Source: Ambulatory Visit | Attending: Cardiovascular Disease | Admitting: Cardiovascular Disease

## 2015-04-06 DIAGNOSIS — Z48812 Encounter for surgical aftercare following surgery on the circulatory system: Secondary | ICD-10-CM | POA: Diagnosis not present

## 2015-04-06 DIAGNOSIS — Z79899 Other long term (current) drug therapy: Secondary | ICD-10-CM | POA: Diagnosis not present

## 2015-04-06 DIAGNOSIS — I252 Old myocardial infarction: Secondary | ICD-10-CM | POA: Diagnosis not present

## 2015-04-06 DIAGNOSIS — Z955 Presence of coronary angioplasty implant and graft: Secondary | ICD-10-CM | POA: Diagnosis not present

## 2015-04-06 DIAGNOSIS — I319 Disease of pericardium, unspecified: Secondary | ICD-10-CM | POA: Diagnosis not present

## 2015-04-06 DIAGNOSIS — I241 Dressler's syndrome: Secondary | ICD-10-CM | POA: Diagnosis not present

## 2015-04-09 ENCOUNTER — Encounter (HOSPITAL_COMMUNITY)
Admission: RE | Admit: 2015-04-09 | Discharge: 2015-04-09 | Disposition: A | Discharge status: Home / Self Care | Payer: Medicare Other | Source: Ambulatory Visit | Attending: Cardiovascular Disease | Admitting: Cardiovascular Disease

## 2015-04-09 ENCOUNTER — Other Ambulatory Visit (HOSPITAL_COMMUNITY): Payer: Self-pay

## 2015-04-09 DIAGNOSIS — Z955 Presence of coronary angioplasty implant and graft: Secondary | ICD-10-CM | POA: Insufficient documentation

## 2015-04-09 DIAGNOSIS — I241 Dressler's syndrome: Secondary | ICD-10-CM | POA: Insufficient documentation

## 2015-04-09 DIAGNOSIS — Z48812 Encounter for surgical aftercare following surgery on the circulatory system: Secondary | ICD-10-CM | POA: Diagnosis not present

## 2015-04-09 DIAGNOSIS — I319 Disease of pericardium, unspecified: Secondary | ICD-10-CM | POA: Insufficient documentation

## 2015-04-09 DIAGNOSIS — Z79899 Other long term (current) drug therapy: Secondary | ICD-10-CM | POA: Insufficient documentation

## 2015-04-09 DIAGNOSIS — I252 Old myocardial infarction: Secondary | ICD-10-CM | POA: Insufficient documentation

## 2015-04-09 MED ORDER — CARVEDILOL 12.5 MG PO TABS: 12.5000 mg | ORAL_TABLET | Freq: Two times a day (BID) | ORAL | Status: DC

## 2015-04-10 ENCOUNTER — Telehealth (HOSPITAL_COMMUNITY): Payer: Self-pay | Admitting: Cardiology

## 2015-04-10 NOTE — Telephone Encounter (Signed)
Pt called with c/o increased SOB after medication changes Pt states he has been doing very well with cardiac rehab and 2 mile daily walks However since recent med changes have been made, pt states he is having some difficulty with completing exercise with out increased SOB

## 2015-04-11 ENCOUNTER — Ambulatory Visit (HOSPITAL_COMMUNITY)
Admission: RE | Admit: 2015-04-11 | Discharge: 2015-04-11 | Disposition: A | Discharge status: Home / Self Care | Payer: Medicare Other | Source: Ambulatory Visit | Attending: Cardiology | Admitting: Cardiology

## 2015-04-11 ENCOUNTER — Encounter (HOSPITAL_COMMUNITY)
Admission: RE | Admit: 2015-04-11 | Discharge: 2015-04-11 | Disposition: A | Discharge status: Home / Self Care | Payer: Medicare Other | Source: Ambulatory Visit | Attending: Cardiovascular Disease | Admitting: Cardiovascular Disease

## 2015-04-11 DIAGNOSIS — Z955 Presence of coronary angioplasty implant and graft: Secondary | ICD-10-CM | POA: Diagnosis not present

## 2015-04-11 DIAGNOSIS — Z79899 Other long term (current) drug therapy: Secondary | ICD-10-CM | POA: Diagnosis not present

## 2015-04-11 DIAGNOSIS — I241 Dressler's syndrome: Secondary | ICD-10-CM | POA: Diagnosis not present

## 2015-04-11 DIAGNOSIS — I5022 Chronic systolic (congestive) heart failure: Secondary | ICD-10-CM

## 2015-04-11 DIAGNOSIS — Z48812 Encounter for surgical aftercare following surgery on the circulatory system: Secondary | ICD-10-CM | POA: Diagnosis not present

## 2015-04-11 DIAGNOSIS — I252 Old myocardial infarction: Secondary | ICD-10-CM | POA: Diagnosis not present

## 2015-04-11 DIAGNOSIS — I319 Disease of pericardium, unspecified: Secondary | ICD-10-CM | POA: Diagnosis not present

## 2015-04-11 LAB — BASIC METABOLIC PANEL
ANION GAP: 9 (ref 5–15)
BUN: 18 mg/dL (ref 6–20)
CO2: 26 mmol/L (ref 22–32)
CREATININE: 1.13 mg/dL (ref 0.61–1.24)
Calcium: 9.4 mg/dL (ref 8.9–10.3)
Chloride: 103 mmol/L (ref 101–111)
GFR calc Af Amer: >60 mL/min (ref >60)
GFR calc non Af Amer: >60 mL/min (ref >60)
GLUCOSE: 94 mg/dL (ref 70–99)
Potassium: 5.2 mmol/L — ABNORMAL HIGH (ref 3.5–5.1)
Sodium: 138 mmol/L (ref 135–145)

## 2015-04-12 NOTE — Telephone Encounter (Signed)
Pt called back he is SOB weight up 3lbs (took lasix sat weight went back down) Pt feels he is SOB due to medication changes (per Dr.[name]) Pt wants to take Colchicine bid not daily.  Will speak with Dr.[name].

## 2015-04-13 ENCOUNTER — Encounter (HOSPITAL_COMMUNITY)
Admission: RE | Admit: 2015-04-13 | Discharge: 2015-04-13 | Disposition: A | Discharge status: Home / Self Care | Payer: Medicare Other | Source: Ambulatory Visit | Attending: Cardiovascular Disease | Admitting: Cardiovascular Disease

## 2015-04-13 DIAGNOSIS — I241 Dressler's syndrome: Secondary | ICD-10-CM | POA: Diagnosis not present

## 2015-04-13 DIAGNOSIS — Z48812 Encounter for surgical aftercare following surgery on the circulatory system: Secondary | ICD-10-CM | POA: Diagnosis not present

## 2015-04-13 DIAGNOSIS — I319 Disease of pericardium, unspecified: Secondary | ICD-10-CM | POA: Diagnosis not present

## 2015-04-13 DIAGNOSIS — I252 Old myocardial infarction: Secondary | ICD-10-CM | POA: Diagnosis not present

## 2015-04-13 DIAGNOSIS — Z955 Presence of coronary angioplasty implant and graft: Secondary | ICD-10-CM | POA: Diagnosis not present

## 2015-04-13 DIAGNOSIS — Z79899 Other long term (current) drug therapy: Secondary | ICD-10-CM | POA: Diagnosis not present

## 2015-04-13 NOTE — Telephone Encounter (Signed)
PT LEFT MESSAGE REGARDING MEDICATION CONCERNS  LMOM

## 2015-04-16 ENCOUNTER — Encounter (HOSPITAL_COMMUNITY)
Admission: RE | Admit: 2015-04-16 | Discharge: 2015-04-16 | Disposition: A | Discharge status: Home / Self Care | Payer: Medicare Other | Source: Ambulatory Visit | Attending: Cardiovascular Disease | Admitting: Cardiovascular Disease

## 2015-04-16 DIAGNOSIS — I241 Dressler's syndrome: Secondary | ICD-10-CM | POA: Diagnosis not present

## 2015-04-16 DIAGNOSIS — I252 Old myocardial infarction: Secondary | ICD-10-CM | POA: Diagnosis not present

## 2015-04-16 DIAGNOSIS — Z79899 Other long term (current) drug therapy: Secondary | ICD-10-CM | POA: Diagnosis not present

## 2015-04-16 DIAGNOSIS — Z955 Presence of coronary angioplasty implant and graft: Secondary | ICD-10-CM | POA: Diagnosis not present

## 2015-04-16 DIAGNOSIS — I319 Disease of pericardium, unspecified: Secondary | ICD-10-CM | POA: Diagnosis not present

## 2015-04-16 DIAGNOSIS — Z48812 Encounter for surgical aftercare following surgery on the circulatory system: Secondary | ICD-10-CM | POA: Diagnosis not present

## 2015-04-17 ENCOUNTER — Telehealth (HOSPITAL_COMMUNITY): Payer: Self-pay | Admitting: *Deleted

## 2015-04-17 NOTE — Telephone Encounter (Signed)
Agree 

## 2015-04-17 NOTE — Telephone Encounter (Signed)
Pt called and stated he wanted Dr Gala RomneyBensimhon to know that he had increased his Colchcine back to bid.  At his appt on 4/28 he was advised to decrease to once daily for a week then could stop if feeling ok.  He states he decreased to once daily for about a week and started having more SOB and was unable to complete cardiac rehab or even walk around his neighborhood so he increased back to bid and has been on that dose for a week now and since that time he is feeling back to normal.  Advised to continue bid dose for now and will let Dr Gala RomneyBensimhon know

## 2015-04-18 ENCOUNTER — Encounter (HOSPITAL_COMMUNITY)
Admission: RE | Admit: 2015-04-18 | Discharge: 2015-04-18 | Disposition: A | Discharge status: Home / Self Care | Payer: Medicare Other | Source: Ambulatory Visit | Attending: Cardiovascular Disease | Admitting: Cardiovascular Disease

## 2015-04-18 DIAGNOSIS — I252 Old myocardial infarction: Secondary | ICD-10-CM | POA: Diagnosis not present

## 2015-04-18 DIAGNOSIS — Z79899 Other long term (current) drug therapy: Secondary | ICD-10-CM | POA: Diagnosis not present

## 2015-04-18 DIAGNOSIS — Z955 Presence of coronary angioplasty implant and graft: Secondary | ICD-10-CM | POA: Diagnosis not present

## 2015-04-18 DIAGNOSIS — Z48812 Encounter for surgical aftercare following surgery on the circulatory system: Secondary | ICD-10-CM | POA: Diagnosis not present

## 2015-04-18 DIAGNOSIS — I319 Disease of pericardium, unspecified: Secondary | ICD-10-CM | POA: Diagnosis not present

## 2015-04-18 DIAGNOSIS — I241 Dressler's syndrome: Secondary | ICD-10-CM | POA: Diagnosis not present

## 2015-04-18 NOTE — Telephone Encounter (Signed)
Pt aware.

## 2015-04-20 ENCOUNTER — Encounter (HOSPITAL_COMMUNITY): Payer: Medicare Other

## 2015-04-23 ENCOUNTER — Encounter (HOSPITAL_COMMUNITY)
Admission: RE | Admit: 2015-04-23 | Discharge: 2015-04-23 | Disposition: A | Discharge status: Home / Self Care | Payer: Medicare Other | Source: Ambulatory Visit | Attending: Cardiovascular Disease | Admitting: Cardiovascular Disease

## 2015-04-23 DIAGNOSIS — I241 Dressler's syndrome: Secondary | ICD-10-CM | POA: Diagnosis not present

## 2015-04-23 DIAGNOSIS — I252 Old myocardial infarction: Secondary | ICD-10-CM | POA: Diagnosis not present

## 2015-04-23 DIAGNOSIS — Z955 Presence of coronary angioplasty implant and graft: Secondary | ICD-10-CM | POA: Diagnosis not present

## 2015-04-23 DIAGNOSIS — I319 Disease of pericardium, unspecified: Secondary | ICD-10-CM | POA: Diagnosis not present

## 2015-04-23 DIAGNOSIS — Z79899 Other long term (current) drug therapy: Secondary | ICD-10-CM | POA: Diagnosis not present

## 2015-04-23 DIAGNOSIS — Z48812 Encounter for surgical aftercare following surgery on the circulatory system: Secondary | ICD-10-CM | POA: Diagnosis not present

## 2015-04-25 ENCOUNTER — Encounter (HOSPITAL_COMMUNITY)
Admission: RE | Admit: 2015-04-25 | Discharge: 2015-04-25 | Disposition: A | Discharge status: Home / Self Care | Payer: Medicare Other | Source: Ambulatory Visit | Attending: Cardiovascular Disease | Admitting: Cardiovascular Disease

## 2015-04-25 DIAGNOSIS — I252 Old myocardial infarction: Secondary | ICD-10-CM | POA: Diagnosis not present

## 2015-04-25 DIAGNOSIS — I241 Dressler's syndrome: Secondary | ICD-10-CM | POA: Diagnosis not present

## 2015-04-25 DIAGNOSIS — I319 Disease of pericardium, unspecified: Secondary | ICD-10-CM | POA: Diagnosis not present

## 2015-04-25 DIAGNOSIS — Z48812 Encounter for surgical aftercare following surgery on the circulatory system: Secondary | ICD-10-CM | POA: Diagnosis not present

## 2015-04-25 DIAGNOSIS — Z79899 Other long term (current) drug therapy: Secondary | ICD-10-CM | POA: Diagnosis not present

## 2015-04-25 DIAGNOSIS — Z955 Presence of coronary angioplasty implant and graft: Secondary | ICD-10-CM | POA: Diagnosis not present

## 2015-04-25 NOTE — Progress Notes (Signed)
Pt graduates from cardiac rehab program on Friday with completion of 32 exercise sessions in Phase II. Pt maintained good attendance and progressed nicely during his participation in rehab as evidenced by increased MET level.   Medication list reconciled. Repeat  PHQ score- 0 .  Pt has made significant lifestyle changes and should be commended for his success. Pt feels he has achieved his goals during cardiac rehab.   Pt plans to continue exercise in cardiac maintenance program after his family vacation in June. We are proud of Kyle Bullock's progress.

## 2015-04-27 ENCOUNTER — Encounter (HOSPITAL_COMMUNITY)
Admission: RE | Admit: 2015-04-27 | Discharge: 2015-04-27 | Disposition: A | Discharge status: Home / Self Care | Payer: Medicare Other | Source: Ambulatory Visit | Attending: Cardiovascular Disease | Admitting: Cardiovascular Disease

## 2015-04-27 DIAGNOSIS — I252 Old myocardial infarction: Secondary | ICD-10-CM | POA: Diagnosis not present

## 2015-04-27 DIAGNOSIS — Z48812 Encounter for surgical aftercare following surgery on the circulatory system: Secondary | ICD-10-CM | POA: Diagnosis not present

## 2015-04-27 DIAGNOSIS — Z79899 Other long term (current) drug therapy: Secondary | ICD-10-CM | POA: Diagnosis not present

## 2015-04-27 DIAGNOSIS — I319 Disease of pericardium, unspecified: Secondary | ICD-10-CM | POA: Diagnosis not present

## 2015-04-27 DIAGNOSIS — I241 Dressler's syndrome: Secondary | ICD-10-CM | POA: Diagnosis not present

## 2015-04-27 DIAGNOSIS — Z955 Presence of coronary angioplasty implant and graft: Secondary | ICD-10-CM | POA: Diagnosis not present

## 2015-04-29 ENCOUNTER — Other Ambulatory Visit: Payer: Self-pay | Admitting: Cardiology

## 2015-04-30 ENCOUNTER — Telehealth (HOSPITAL_COMMUNITY): Payer: Self-pay | Admitting: Vascular Surgery

## 2015-04-30 NOTE — Telephone Encounter (Signed)
Pt needs note ok to return to work 6/4 with no restrictions, would like a call when letter is ready and he can pick it up, will send to Dr Gala RomneyBensimhon to complete

## 2015-04-30 NOTE — Telephone Encounter (Signed)
Pt called he needs a back work Physicist, medicalletter.Marland Kitchen. He is coming in for a CPX  05/14/15 he is hoping to pick up letter then.. Please advise

## 2015-05-04 ENCOUNTER — Encounter (HOSPITAL_COMMUNITY): Payer: Self-pay | Admitting: Internal Medicine

## 2015-05-08 ENCOUNTER — Encounter (HOSPITAL_COMMUNITY): Payer: Self-pay | Admitting: *Deleted

## 2015-05-08 ENCOUNTER — Encounter: Payer: Self-pay | Admitting: Internal Medicine

## 2015-05-08 NOTE — Telephone Encounter (Signed)
Pt aware letter completed, he will come by to pick it up tomorrow

## 2015-05-14 ENCOUNTER — Ambulatory Visit (HOSPITAL_COMMUNITY): Payer: Medicare Other | Attending: Cardiology

## 2015-05-14 ENCOUNTER — Other Ambulatory Visit (HOSPITAL_COMMUNITY): Payer: Self-pay | Admitting: *Deleted

## 2015-05-14 DIAGNOSIS — I5022 Chronic systolic (congestive) heart failure: Secondary | ICD-10-CM

## 2015-05-14 DIAGNOSIS — Z87891 Personal history of nicotine dependence: Secondary | ICD-10-CM | POA: Diagnosis not present

## 2015-05-14 DIAGNOSIS — R0600 Dyspnea, unspecified: Secondary | ICD-10-CM

## 2015-05-21 ENCOUNTER — Ambulatory Visit (HOSPITAL_COMMUNITY)
Admission: RE | Admit: 2015-05-21 | Discharge: 2015-05-21 | Disposition: A | Discharge status: Home / Self Care | Payer: Medicare Other | Source: Ambulatory Visit | Attending: Internal Medicine | Admitting: Internal Medicine

## 2015-05-21 ENCOUNTER — Encounter (HOSPITAL_COMMUNITY): Payer: Self-pay

## 2015-05-21 VITALS — BP 94/56 | HR 61 | Wt 195.0 lb

## 2015-05-21 DIAGNOSIS — I5022 Chronic systolic (congestive) heart failure: Secondary | ICD-10-CM | POA: Diagnosis not present

## 2015-05-21 DIAGNOSIS — Z87891 Personal history of nicotine dependence: Secondary | ICD-10-CM | POA: Diagnosis not present

## 2015-05-21 DIAGNOSIS — I241 Dressler's syndrome: Secondary | ICD-10-CM | POA: Insufficient documentation

## 2015-05-21 DIAGNOSIS — J439 Emphysema, unspecified: Secondary | ICD-10-CM | POA: Diagnosis not present

## 2015-05-21 DIAGNOSIS — I272 Pulmonary hypertension, unspecified: Secondary | ICD-10-CM

## 2015-05-21 DIAGNOSIS — I2510 Atherosclerotic heart disease of native coronary artery without angina pectoris: Secondary | ICD-10-CM | POA: Diagnosis not present

## 2015-05-21 DIAGNOSIS — I270 Primary pulmonary hypertension: Secondary | ICD-10-CM

## 2015-05-21 NOTE — Progress Notes (Addendum)
Patient ID: Kyle Bullock, male   DOB: [birth year], 64 y.o.   MRN: [id number]    Advanced HF Clinic Note   History of Present Illness: Kyle Bullock is a 64 y.o. male who is referred to the HF Clinic by Dr. Excell Seltzer. He has h/o CAD and ischemic cardiomyopathy 20-25%.  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    In December 2015, he suffered an acute anterolateral MI complicated by VF arrest in [place]. He underwent drug-eluting stent placement to the LAD and PTCA of the diagonal. EF was 25% and a life vest was placed prior to discharge. Following return to [place], he developed dyspnea and presented to [facility] where he was readmitted. His Brilinta was switched to Effient as it was felt the Brilinta may have been playing a role in his dyspnea. He was also switched from an ACE inhibitor to an ARB. He was seen by electrophysiology with recommendation for ongoing life vest therapy and follow-up echo in 90 days post revascularization. He was then seen in clinic in mid December where he reported pleuritic chest pain. He was readmitted and underwent diagnostic catheterization revealing patency of previously placed stent and area of balloon angioplasty in the diagonal. EF was 20-25% with diffuse hypokinesis and normal pericardial appearance. He did have some hypotension requiring discontinuation of ARB therapy. His beta blocker was also down titrated. He was diagnosed with Dressler's syndrome and was placed on prednisone taper and colchicine.  He returns for heart failure follow up. Last visit losartan was increased to  25 mg daily. Walking at least 2 1/2 miles daily. Completed cardiac rehab. Denies SOB/PND/Orthopnea. Weight at home 188-189 pounds. Taking all medications. Following low salt and low fat diet. Remains nicotine free.    CPX 05/14/2015  Peak VO2: 19.0 (64.5% predicted peak VO2) VE/VCO2 slope: 41.5 OUES: 2.01 Peak RER: 1.06 Ventilatory Threshold: 14.1     (47.9%  predicted or measured peak VO2)   Labs  04/10/2014: K 5.2 Creatinine 1.13    Past Medical History  Diagnosis Date  . Hypertension   . Ischemic cardiomyopathy     a. 11/2014: EF reportedly 20% by cath and echo in [place].  . VF (ventricular fibrillation) 11/10/14    a. 11/2014: arrest with STEMI.  Marland Kitchen NSVT (nonsustained ventricular tachycardia) 11/16/14    a. 11/2014: admitted after discharge from STEMI admission, 18 beats 135-140bpm in ED.  Marland Kitchen CAD (coronary artery disease)     a. 11/2014: arrest/STEMI s/p Xience DES to the LAD and PTCA to the D1 [place], [place]). b. Relook cath 11/2014: no culprit, patent stent.  Marland Kitchen Dyspnea     a. 11/2014: possibly due to combo of CHF and Brilinta. Brilinta changed to Effient.  . Tobacco abuse   . RBBB   . Dyslipidemia   . Abnormal TSH     a. 11/2014: felt d/t sick euthyroid.  Marland Kitchen Pericarditis dx'd 11/2014  . Dressler's syndrome     a. 11/2014: tx with colchicine and prednisone.  . Pericardial effusion     a. 11/2014.  Marland Kitchen Erectile dysfunction     a. Pt aware not to take ED med within 24 hr of NTG and vice versa.  Marland Kitchen AICD (automatic cardioverter/defibrillator) present 02/28/2015  . CHF (congestive heart failure)   . STEMI (ST elevation myocardial infarction) 11/10/2014  . Pneumonia 1990's X 2  . Sleep apnea     suspected but not diagnosed (02/28/2015)    Past Surgical History  Procedure Laterality Date  . Hand surgery Bilateral     Dupuytren's Contracture  . Knee arthroscopy Right 1982  . Left heart cath N/A 11/19/2014    Procedure: LEFT HEART CATH;  Surgeon: Runell Gess, MD;  Location: [facility] CATH LAB;  Service: Cardiovascular;  Laterality: N/A;  . Cardiac defibrillator placement  02/28/2015  . Coronary angioplasty with stent placement  11/10/2014    LAD DES  . Cardiac catheterization  11/19/2014  . Implantable cardioverter defibrillator implant N/A 02/28/2015    Procedure: IMPLANTABLE CARDIOVERTER DEFIBRILLATOR IMPLANT;  Surgeon: Hillis Range,  MD;  Location: [facility] CATH LAB;  Service: Cardiovascular;  Laterality: N/A;    Current Outpatient Prescriptions  Medication Sig Dispense Refill  . aspirin EC 81 MG tablet Take 81 mg by mouth daily.    Marland Kitchen atorvastatin (LIPITOR) 20 MG tablet Take 1 tablet (20 mg total) by mouth daily.    . carvedilol (COREG) 12.5 MG tablet Take 1 tablet (12.5 mg total) by mouth 2 (two) times daily with a meal. 60 tablet 6  . clopidogrel (PLAVIX) 75 MG tablet Take 1 tablet (75 mg total) by mouth daily. 90 tablet 3  . colchicine 0.6 MG tablet TAKE 1 TABLET BY MOUTH TWICE DAILY 60 tablet 0  . furosemide (LASIX) 40 MG tablet Take 1 tablet (40 mg total) by mouth daily as needed. 15 tablet 5  . losartan (COZAAR) 25 MG tablet Take 1 tablet (25 mg total) by mouth at bedtime. 90 tablet 3  . spironolactone (ALDACTONE) 25 MG tablet Take 1 tablet (25 mg total) by mouth daily. 90 tablet 3  . nitroGLYCERIN (NITROSTAT) 0.4 MG SL tablet Place 0.4 mg under the tongue every 5 (five) minutes as needed for chest pain.     No current facility-administered medications for this encounter.    Allergies:   Ambien; Lisinopril; and Brilinta   Social History:  The patient  reports that he quit smoking about 6 months ago. His smoking use included Cigarettes. He has a 80 pack-year smoking history. He has never used smokeless tobacco. He reports that he drinks about 8.4 oz of alcohol per week. He reports that he does not use illicit drugs.   Family History:  The patient's  family history includes COPD in his mother; Hypertension in his father and mother. There is no history of CAD.    ROS:  Please see the history of present illness.  Otherwise, review of systems is positive for shortness of breath.  All other systems are reviewed and negative.    PHYSICAL EXAM: VS:  BP 94/56 mmHg  Pulse 61  Wt 195 lb (88.451 kg)  SpO2 97% , BMI Body mass index is 24.37 kg/(m^2).  GEN: Well nourished, well developed, in no acute distress HEENT:  normal Neck: no JVD, no masses, no carotid bruits Cardiac: RRR without murmur or gallop. ICD site looks good.             Respiratory:  clear to auscultation bilaterally, normal work of breathing GI: soft, nontender, nondistended, + BS MS: no deformity or atrophy Ext: no pretibial edema Skin: warm and dry, no rash Neuro:  Strength and sensation are intact Psych: euthymic mood, full affect   Recent Labs: 11/24/2014: Magnesium 2.4 01/29/2015: ALT 47; TSH 5.75* 02/07/2015: Pro B Natriuretic peptide (BNP) 1442.0* 02/26/2015: Hemoglobin 13.9; Platelets 271.0 04/11/2015: BUN 18; Creatinine, Ser 1.13; Potassium 5.2*; Sodium 138   Lipid Panel     Component Value Date/Time   CHOL 108 01/08/2015 0850  CHOL 101 12/04/2014 1030   TRIG 155.0* 01/08/2015 0850   HDL 39.00* 01/08/2015 0850   HDL 43 12/04/2014 1030   CHOLHDL 3 01/08/2015 0850   CHOLHDL 2.3 12/04/2014 1030   VLDL 31.0 01/08/2015 0850   LDLCALC 38 01/08/2015 0850   LDLCALC 39 12/04/2014 1030      Wt Readings from Last 3 Encounters:  05/21/15 195 lb (88.451 kg)  04/05/15 195 lb (88.451 kg)  03/21/15 191 lb (86.637 kg)     Cardiac Studies Reviewed: 2D Echo 11/13/2014: Study Conclusions  - Left ventricle: The cavity size was normal. Wall thickness was normal. Systolic function was severely reduced. The estimated ejection fraction was in the range of 20% to 25%. Diastolic dysfunction with elevated LV Filling pressure. Diffuse hypokinesis. - Mitral valve: Mildly thickened leaflets . There was mild regurgitation. - Left atrium: The atrium was at the upper limits of normal in size. - Right atrium: The atrium was mildly dilated. - Pericardium, extracardiac: Small circumferential pericardial effusion. Tamponade physiology is not suspect, but cannot be ruled-out on the basis of this study.  Impressions:  - Compared to the prior echo in 11/2014, there is now a small circumferential pericardial effusion -  no clear tamponade physiology, however, the IVC is dilated. There has been no improvement in LVEF.  ASSESSMENT AND PLAN:  1. Chronic systolic due to ischemic CM - HF EF 25-30% echo 3/16.  HasST Jude ICD. med Rx has been limited because of symptomatic hypotension. Dr Gala Romney discussed CPX results. Mildly-mod emphysema and mild -mod limitation from from HF. Plan to repeat in 6 months. Continue exercise. ST Jude interrogated device. < 10% PVCs reported. One episode of NSVT.   --Currently NYHA I-II. Volume status looks good.  - Change lasix to as needed.   - Continue losartan to 25 daily. Most recent K 5.2  - Continue carvedilol 12.5 mg twice a day . Will not increase with heart rate 61 and soft BP.  -Continue sprio 25 mg daily.   2.  CAD s/p Anterior MI December 2015:  --No recurrent angina. Cardiac rehab flowsheets reviewed. He will continue on ASA, statin and Effient per Dr. Excell Seltzer 3. Dressler's syndrome --Resolved. Off prednisone. Wean colchicine to once a day.  4. Tobacco abuse:  --complete smoking cessation now > 3 months (quit at time of MI). 5. Emphysema- Refer to pulmonary to see if he benefits from inhalers.    Follow up in 8 weeks.    CLEGG,AMY, NP  05/21/2015 10:51 AM     Patient seen and examined with Tonye Becket, NP. We discussed all aspects of the encounter. I agree with the assessment and plan as stated above.   Doing very well. Volume status was up when he cut back lasix so he increased on his own which was great. CPX reviewed in detail. Had mild to moderate emphysema with mild to moderate HF limitation. Suspect VE/VCO2 slope somewhat artificially elevated due to COPD. Continue current regimen as BP too soft to titrate. Will refer to Pulmonary to see if he would benefit from chronic inhalers. Wean colchicine to once daily.   Total time spent 45 minutes. Over half that time spent discussing above.   Cope Marte,MD 3:00 PM

## 2015-05-21 NOTE — Progress Notes (Signed)
Advanced Heart Failure Medication Review by a Pharmacist  Does the patient  feel that his/her medications are working for him/her?  yes  Has the patient been experiencing any side effects to the medications prescribed?  no  Does the patient measure his/her own blood pressure or blood glucose at home?  no   Does the patient have any problems obtaining medications due to transportation or finances?   no  Understanding of regimen: good Understanding of indications: good Potential of compliance: excellent    Pharmacist comments: Patient presents to heart failure clinic with his wife and medications were reviewed with a pharmacist. No medication discrepancies noted. Patient does complain of diarrhea for the past few months. No recent antibiotic use, has not tried taking anything to alleviate it. Discussed that from a medication perspective, colchicine may be contributing to his diarrhea. Also recommended trying Imodium to treat symptoms. No other medication-related questions at this time.   Megan E. Supple, Pharm.D Clinical Pharmacy Resident Pager: [phone] 05/21/2015 10:55 AM

## 2015-05-21 NOTE — Patient Instructions (Signed)
You have been referred to Dr.McQuaid (Pulmonlogist)  FOLLOW UP in 2 months.

## 2015-05-24 ENCOUNTER — Other Ambulatory Visit: Payer: Self-pay | Admitting: Cardiology

## 2015-06-04 ENCOUNTER — Other Ambulatory Visit: Payer: Self-pay

## 2015-06-05 ENCOUNTER — Ambulatory Visit (INDEPENDENT_AMBULATORY_CARE_PROVIDER_SITE_OTHER): Payer: Medicare Other | Admitting: Pulmonary Disease

## 2015-06-05 ENCOUNTER — Encounter: Payer: Self-pay | Admitting: Pulmonary Disease

## 2015-06-05 VITALS — BP 138/76 | HR 75 | Ht 75.0 in | Wt 198.0 lb

## 2015-06-05 DIAGNOSIS — J432 Centrilobular emphysema: Secondary | ICD-10-CM | POA: Insufficient documentation

## 2015-06-05 DIAGNOSIS — I241 Dressler's syndrome: Secondary | ICD-10-CM | POA: Diagnosis not present

## 2015-06-05 DIAGNOSIS — R9389 Abnormal findings on diagnostic imaging of other specified body structures: Secondary | ICD-10-CM | POA: Insufficient documentation

## 2015-06-05 DIAGNOSIS — R938 Abnormal findings on diagnostic imaging of other specified body structures: Secondary | ICD-10-CM | POA: Diagnosis not present

## 2015-06-05 DIAGNOSIS — J438 Other emphysema: Secondary | ICD-10-CM | POA: Diagnosis not present

## 2015-06-05 MED ORDER — ALBUTEROL SULFATE 108 (90 BASE) MCG/ACT IN AEPB: 1.0000 | INHALATION_SPRAY | Freq: Four times a day (QID) | RESPIRATORY_TRACT | Status: DC | PRN

## 2015-06-05 NOTE — Progress Notes (Signed)
Subjective:    Patient ID: Kyle Bullock, male    DOB: 1951-01-26, 64 y.o.   MRN: [id number]  HPI Chief Complaint  Patient presents with  . Advice Only    Referred by Dr. Gala RomneyBensimhon for pulmonary htn.     This is a very pleasant 64 year old male who previously smoked heavily up until his heart attack in 2015 who comes to my clinic today for evaluation of emphysema. He was referred by our advanced heart failure clinic. He stated that he had no childhood respiratory illnesses but unfortunately started smoking as a teenager and then consistently smoked a pack or more a day after age 64. This continued until December 2015 when he had a heart attack and a cardiac arrest. He was treated in [place] where he was visiting at the time. He ended up having a percutaneous intervention for his coronary disease and was left with an ejection fraction of around 25%. He is since been followed by the advanced heart failure clinic here in town.  He states that immediately following his heart attack and hospitalizations he struggled with shortness of breath for quite some time. However, he has been pertussis appearing in cardiac rehabilitation aggressively and his shortness of breath has been slowly improving. He remained somewhat short of breath when climbing hills or performing heavy exertion however and so because of that he had a cardiopulmonary exercise stress test earlier this month. That test was worrisome for pulmonary limitation and so therefore he was sent to our clinic for further evaluation.  He says that he rarely coughs and he does not wheeze. He does not have significant chest pain. Prior to his heart attack he stated that he would experience some mild shortness of breath if he was climbing hills or pushing himself a rapid pace.  Past Medical History  Diagnosis Date  . Hypertension   . Ischemic cardiomyopathy     a. 11/2014: EF reportedly 20% by cath and echo in [place].  . VF  (ventricular fibrillation) 11/10/14    a. 11/2014: arrest with STEMI.  Marland Kitchen. NSVT (nonsustained ventricular tachycardia) 11/16/14    a. 11/2014: admitted after discharge from STEMI admission, 18 beats 135-140bpm in ED.  Marland Kitchen. CAD (coronary artery disease)     a. 11/2014: arrest/STEMI s/p Xience DES to the LAD and PTCA to the D1 [place], [place] b. Relook cath 11/2014: no culprit, patent stent.  Marland Kitchen. Dyspnea     a. 11/2014: possibly due to combo of CHF and Brilinta. Brilinta changed to Effient.  . Tobacco abuse   . RBBB   . Dyslipidemia   . Abnormal TSH     a. 11/2014: felt d/t sick euthyroid.  Marland Kitchen. Pericarditis dx'd 11/2014  . Dressler's syndrome     a. 11/2014: tx with colchicine and prednisone.  . Pericardial effusion     a. 11/2014.  Marland Kitchen. Erectile dysfunction     a. Pt aware not to take ED med within 24 hr of NTG and vice versa.  Marland Kitchen. AICD (automatic cardioverter/defibrillator) present 02/28/2015  . CHF (congestive heart failure)   . STEMI (ST elevation myocardial infarction) 11/10/2014  . Pneumonia 1990's X 2  . Sleep apnea     suspected but not diagnosed (02/28/2015)     Family History  Problem Relation Age of Onset  . CAD Neg Hx   . COPD Mother   . Hypertension Mother   . Hypertension Father      History   Social History  .  Marital Status: Married    Spouse Name: N/A  . Number of Children: 2  . Years of Education: N/A   Occupational History  . Not on file.   Social History Main Topics  . Smoking status: Former Smoker -- 2.00 packs/day for 40 years    Types: Cigarettes    Quit date: 11/10/2014  . Smokeless tobacco: Never Used  . Alcohol Use: 8.4 oz/week    0 Standard drinks or equivalent, 14 Glasses of wine per week     Comment: 02/28/2015 "2, 4oz glasses of wine/night"  . Drug Use: No  . Sexual Activity: Not Currently   Other Topics Concern  . 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  Attended [employer].  Now lives in [place].       Allergies  Allergen Reactions  . Ambien [Zolpidem Tartrate] Anxiety and Other (See Comments)    Causes nightmares  . Lisinopril Cough  . Brilinta [Ticagrelor] Cough    ? Dyspnea. Changed to Effient 11/2014.     Outpatient Prescriptions Prior to Visit  Medication Sig Dispense Refill  . aspirin EC 81 MG tablet Take 81 mg by mouth daily.    Marland Kitchen atorvastatin (LIPITOR) 20 MG tablet Take 1 tablet (20 mg total) by mouth daily.    . carvedilol (COREG) 12.5 MG tablet Take 1 tablet (12.5 mg total) by mouth 2 (two) times daily with a meal. 60 tablet 6  . clopidogrel (PLAVIX) 75 MG tablet Take 1 tablet (75 mg total) by mouth daily. 90 tablet 3  . colchicine 0.6 MG tablet TAKE 1 TABLET BY MOUTH TWICE DAILY (Patient taking differently: TAKE 1 TABLET BY MOUTH daily) 60 tablet 0  . furosemide (LASIX) 40 MG tablet Take 1 tablet (40 mg total) by mouth daily as needed. 15 tablet 5  . losartan (COZAAR) 25 MG tablet Take 1 tablet (25 mg total) by mouth at bedtime. 90 tablet 3  . nitroGLYCERIN (NITROSTAT) 0.4 MG SL tablet Place 0.4 mg under the tongue every 5 (five) minutes as needed for chest pain.    Marland Kitchen spironolactone (ALDACTONE) 25 MG tablet Take 1 tablet (25 mg total) by mouth daily. 90 tablet 3   No facility-administered medications prior to visit.       Review of Systems  Constitutional: Negative for fever and unexpected weight change.  HENT: Negative for congestion, dental problem, ear pain, nosebleeds, postnasal drip, rhinorrhea, sinus pressure, sneezing, sore throat and trouble swallowing.   Eyes: Negative for redness and itching.  Respiratory: Positive for shortness of breath. Negative for cough, chest tightness and wheezing.   Cardiovascular: Negative for palpitations and leg swelling.  Gastrointestinal: Negative for nausea and vomiting.  Genitourinary: Negative for dysuria.  Musculoskeletal: Negative for joint swelling.  Skin: Negative for rash.  Neurological: Negative for headaches.   Hematological: Does not bruise/bleed easily.  Psychiatric/Behavioral: Negative for dysphoric mood. The patient is not nervous/anxious.        Objective:   Physical Exam  Filed Vitals:   06/05/15 1543  BP: 138/76  Pulse: 75  Height:  (1.905 m)  Weight: 198 lb (89.812 kg)  SpO2: 96%  RA  Ambulated 500 feet on room air and his O2 saturation remained at 97% or above.  Gen: well appearing, no acute distress HENT: NCAT, OP clear, neck supple without masses Eyes: PERRL, EOMi Lymph: no cervical lymphadenopathy PULM: Few crackles left base, few wheezes upper lobes, otherwise clear CV: RRR,  no mgr, no JVD GI: BS+, soft, nontender, no hsm Derm: no rash or skin breakdown MSK: normal bulk and tone Neuro: A&Ox4, CN II-XII intact, strength 5/5 in all 4 extremities Psyche: normal mood and affect   December 2015 CT angiogram chest images personally reviewed: No pulmonary embolism, mild centrilobular and paraseptal emphysema and upper lobe distribution bilaterally, small pleural effusion on the left, there is dependent groundglass and mild interlobular septal thickening bilaterally Spirometry as part of June 2016 cardiopulmonary exercise test: Ratio 73%, FEV1 2.4 L (61% predicted), FVC 3.27 L (66% predicted).    Assessment & Plan:  Centrilobular emphysema Kyle Bullock smoked for many years and has been left with emphysema as evidenced by the findings on his CT chest from December 2015. Fortunately this appears to be mild emphysema. His FEV1 was 61% predicted, but this is in the setting of possible restrictive lung disease and so it is difficult to interpret this number.  However, it should be noted that there was no classic airflow obstruction based on my review of the flow volume loop and the fact that his FEV1 to FVC ratio is greater than 70%. Therefore he does not have COPD. Clearly emphysema is due to tobacco use and is contributing to his shortness of breath.  In regards to the possible  restrictive lung disease we need to perform lung function testing with lung volumes in order to confirm this. It could be that he has air trapping that makes his spirometry looks this way.  While I believe that his emphysema contributes to his shortness of breath, the fact that he only used approximately 58% of his predicted maximum voluntary ventilation capacity I would not consider his cardiopulmonary exercise test to be "respiratory limited". I believe that his shortness of breath is a multifactorial problem related to deconditioning, emphysema, and systolic congestive heart failure.  Plan: Full pulmonary function testing to look for evidence of restrictive lung disease versus air trapping Trial of albuterol before exercise and as needed with shortness of breath  Abnormal CT scan, chest 9 I have personally reviewed the images from his CT chest from December 2019 which showed mild centrilobular and paraseptal emphysema and an upper lobe distribution bilaterally as well as mild patches of interlobular septal thickening and groundglass in a dependent and basilar pattern.  The radiologist interpreted the CT chest as findings consistent with pulmonary fibrosis. I do not think this is the case necessarily as he clearly had some volume overload at the time as evidenced by the left-sided pleural effusion and the dependent nature of these abnormalities.  However, this needs to be followed up with a high-resolution CT chest for primary function testing.  Plan: High resolution CT chest Pulmonary function test     Current outpatient prescriptions:  .  aspirin EC 81 MG tablet, Take 81 mg by mouth daily., Disp: , Rfl:  .  atorvastatin (LIPITOR) 20 MG tablet, Take 1 tablet (20 mg total) by mouth daily., Disp: , Rfl:  .  carvedilol (COREG) 12.5 MG tablet, Take 1 tablet (12.5 mg total) by mouth 2 (two) times daily with a meal., Disp: 60 tablet, Rfl: 6 .  clopidogrel (PLAVIX) 75 MG tablet, Take 1 tablet  (75 mg total) by mouth daily., Disp: 90 tablet, Rfl: 3 .  colchicine 0.6 MG tablet, TAKE 1 TABLET BY MOUTH TWICE DAILY (Patient taking differently: TAKE 1 TABLET BY MOUTH daily), Disp: 60 tablet, Rfl: 0 .  furosemide (LASIX) 40 MG tablet, Take 1 tablet (40 mg  total) by mouth daily as needed., Disp: 15 tablet, Rfl: 5 .  losartan (COZAAR) 25 MG tablet, Take 1 tablet (25 mg total) by mouth at bedtime., Disp: 90 tablet, Rfl: 3 .  nitroGLYCERIN (NITROSTAT) 0.4 MG SL tablet, Place 0.4 mg under the tongue every 5 (five) minutes as needed for chest pain., Disp: , Rfl:  .  spironolactone (ALDACTONE) 25 MG tablet, Take 1 tablet (25 mg total) by mouth daily., Disp: 90 tablet, Rfl: 3 .  Albuterol Sulfate (PROAIR RESPICLICK) 108 (90 BASE) MCG/ACT AEPB, Inhale 1 puff into the lungs every 6 (six) hours as needed (shortness of breath)., Disp: 1 each, Rfl: 0

## 2015-06-05 NOTE — Assessment & Plan Note (Signed)
9 I have personally reviewed the images from his CT chest from December 2019 which showed mild centrilobular and paraseptal emphysema and an upper lobe distribution bilaterally as well as mild patches of interlobular septal thickening and groundglass in a dependent and basilar pattern.  The radiologist interpreted the CT chest as findings consistent with pulmonary fibrosis. I do not think this is the case necessarily as he clearly had some volume overload at the time as evidenced by the left-sided pleural effusion and the dependent nature of these abnormalities.  However, this needs to be followed up with a high-resolution CT chest for primary function testing.  Plan: High resolution CT chest Pulmonary function test

## 2015-06-05 NOTE — Patient Instructions (Signed)
Take 1 puff of the albuterol inhaler 10 minutes before exercise or every 6 hours as needed for shortness of breath We will arrange a lung function test and a high-resolution CT scan of your chest in call you with the results We will see you back in 3-4 weeks or sooner if needed

## 2015-06-05 NOTE — Assessment & Plan Note (Addendum)
Kyle Bullock smoked for many years and has been left with emphysema as evidenced by the findings on Kyle Bullock CT chest from December 2015. Fortunately this appears to be mild emphysema. Kyle Bullock FEV1 was 61% predicted, but this is in the setting of possible restrictive lung disease and so it is difficult to interpret this number.  However, it should be noted that there was no classic airflow obstruction based on my review of the flow volume loop and the fact that Kyle Bullock FEV1 to FVC ratio is greater than 70%. Therefore he does not have COPD. Clearly emphysema is due to tobacco use and is contributing to Kyle Bullock shortness of breath.  In regards to the possible restrictive lung disease we need to perform lung function testing with lung volumes in order to confirm this. It could be that he has air trapping that makes Kyle Bullock spirometry looks this way.  While I believe that Kyle Bullock emphysema contributes to Kyle Bullock shortness of breath, the fact that he only used approximately 58% of Kyle Bullock predicted maximum voluntary ventilation capacity I would not consider Kyle Bullock cardiopulmonary exercise test to be "respiratory limited". I believe that Kyle Bullock shortness of breath is a multifactorial problem related to deconditioning, emphysema, and systolic congestive heart failure.  Plan: Full pulmonary function testing to look for evidence of restrictive lung disease versus air trapping Trial of albuterol before exercise and as needed with shortness of breath

## 2015-06-13 ENCOUNTER — Other Ambulatory Visit: Payer: Medicare Other

## 2015-06-14 ENCOUNTER — Ambulatory Visit (INDEPENDENT_AMBULATORY_CARE_PROVIDER_SITE_OTHER)
Admission: RE | Admit: 2015-06-14 | Discharge: 2015-06-14 | Disposition: A | Discharge status: Home / Self Care | Payer: Medicare Other | Source: Ambulatory Visit | Attending: Pulmonary Disease | Admitting: Pulmonary Disease

## 2015-06-14 ENCOUNTER — Ambulatory Visit (HOSPITAL_COMMUNITY)
Admission: RE | Admit: 2015-06-14 | Discharge: 2015-06-14 | Disposition: A | Discharge status: Home / Self Care | Payer: Medicare Other | Source: Ambulatory Visit | Attending: Pulmonary Disease | Admitting: Pulmonary Disease

## 2015-06-14 DIAGNOSIS — J438 Other emphysema: Secondary | ICD-10-CM | POA: Diagnosis not present

## 2015-06-14 DIAGNOSIS — R9389 Abnormal findings on diagnostic imaging of other specified body structures: Secondary | ICD-10-CM

## 2015-06-14 DIAGNOSIS — R918 Other nonspecific abnormal finding of lung field: Secondary | ICD-10-CM

## 2015-06-14 DIAGNOSIS — J9809 Other diseases of bronchus, not elsewhere classified: Secondary | ICD-10-CM | POA: Diagnosis not present

## 2015-06-14 DIAGNOSIS — J432 Centrilobular emphysema: Secondary | ICD-10-CM | POA: Diagnosis not present

## 2015-06-14 LAB — PULMONARY FUNCTION TEST
DL/VA % PRED: 61 %
DL/VA: 2.99 ml/min/mmHg/L
DLCO UNC % PRED: 38 %
DLCO unc: 15.23 ml/min/mmHg
FEF 25-75 Pre: 1.81 L/sec
FEF2575-%Pred-Pre: 56 %
FEV1-%Pred-Pre: 59 %
FEV1-Pre: 2.42 L
FEV1FVC-%PRED-PRE: 97 %
FEV6-%Pred-Pre: 63 %
FEV6-Pre: 3.32 L
FEV6FVC-%PRED-PRE: 104 %
FVC-%PRED-PRE: 61 %
FVC-Pre: 3.33 L
PRE FEV6/FVC RATIO: 100 %
Pre FEV1/FVC ratio: 73 %
RV % pred: 90 %
RV: 2.38 L
TLC % PRED: 71 %
TLC: 5.74 L

## 2015-06-15 DIAGNOSIS — R918 Other nonspecific abnormal finding of lung field: Secondary | ICD-10-CM | POA: Insufficient documentation

## 2015-07-03 ENCOUNTER — Encounter: Payer: Self-pay | Admitting: *Deleted

## 2015-07-05 ENCOUNTER — Ambulatory Visit (INDEPENDENT_AMBULATORY_CARE_PROVIDER_SITE_OTHER): Payer: Medicare Other | Admitting: Pulmonary Disease

## 2015-07-05 ENCOUNTER — Encounter: Payer: Self-pay | Admitting: Pulmonary Disease

## 2015-07-05 VITALS — BP 126/68 | HR 60 | Ht 75.0 in | Wt 196.0 lb

## 2015-07-05 DIAGNOSIS — I499 Cardiac arrhythmia, unspecified: Secondary | ICD-10-CM | POA: Insufficient documentation

## 2015-07-05 DIAGNOSIS — I241 Dressler's syndrome: Secondary | ICD-10-CM | POA: Diagnosis not present

## 2015-07-05 DIAGNOSIS — R918 Other nonspecific abnormal finding of lung field: Secondary | ICD-10-CM | POA: Diagnosis not present

## 2015-07-05 NOTE — Progress Notes (Signed)
Subjective:    Patient ID: Kyle Bullock, male    DOB: [birth year], 64 y.o.   MRN: [id number]   Synopsis: Referred in 2016 for evaluation of COPD. Has ischemic cardiomyopathy after a large MI. LVEF 25-30% as per echocardiogram in March 2016. December 2015 CT angiogram chest: No pulmonary embolism, mild centrilobular and paraseptal emphysema and upper lobe distribution bilaterally, small pleural effusion on the left, there is dependent groundglass and mild interlobular septal thickening bilaterally Spirometry as part of June 2016 cardiopulmonary exercise test: Ratio 73%, FEV1 2.4 L (61% predicted), FVC 3.27 L (66% predicted). 06/2015 CT chest HRCT> very mild patches of ground glass in the bases of both lungs with non-specific interstitial changes, no clear fibrotic change; scattered pulmonary nodules appear stable compared to 11/2014, emphysema and bronchial wall thickening noted July 2016 pulmonary function testing: Ratio 73% predicted, FEV1 2.42 L (59% predicted) FVC 3.33 L (61% predicted), total lung capacity 5.74 L (71% predicted), DLCO 15.23 (38% predicted), Hgb 13.5  HPI Chief Complaint  Patient presents with  . Follow-up    review pft and ct chest.  pt states he is doing well, no breathing complaints at this time.     Youssouf has been remaining active with walking.  He continues to walk 2.5 miles a day and this really feels good. No bronchitis since the last visit. He feels that he has not been limited in terms of his shortness of breath. He has no cough. He is here to go over the results of his pulmonary function test and his chest CT. He tried the albuterol prn but this didn't make much difference in his breathing because he already felt well with exercise.  Past Medical History  Diagnosis Date  . Hypertension   . Ischemic cardiomyopathy     a. 11/2014: EF reportedly 20% by cath and echo in [place].  . VF (ventricular fibrillation) 11/10/14    a. 11/2014: arrest with STEMI.    Marland Kitchen NSVT (nonsustained ventricular tachycardia) 11/16/14    a. 11/2014: admitted after discharge from STEMI admission, 18 beats 135-140bpm in ED.  Marland Kitchen CAD (coronary artery disease)     a. 11/2014: arrest/STEMI s/p Xience DES to the LAD and PTCA to the D1 [place], [place]). b. Relook cath 11/2014: no culprit, patent stent.  Marland Kitchen Dyspnea     a. 11/2014: possibly due to combo of CHF and Brilinta. Brilinta changed to Effient.  . Tobacco abuse   . RBBB   . Dyslipidemia   . Abnormal TSH     a. 11/2014: felt d/t sick euthyroid.  Marland Kitchen Pericarditis dx'd 11/2014  . Dressler's syndrome     a. 11/2014: tx with colchicine and prednisone.  . Pericardial effusion     a. 11/2014.  Marland Kitchen Erectile dysfunction     a. Pt aware not to take ED med within 24 hr of NTG and vice versa.  Marland Kitchen AICD (automatic cardioverter/defibrillator) present 02/28/2015  . CHF (congestive heart failure)   . STEMI (ST elevation myocardial infarction) 11/10/2014  . Pneumonia 1990's X 2  . Sleep apnea     suspected but not diagnosed (02/28/2015)      Review of Systems  Constitutional: Negative for fever, diaphoresis and fatigue.  HENT: Negative for rhinorrhea, sinus pressure and sneezing.   Respiratory: Negative for cough, shortness of breath and wheezing.   Cardiovascular: Negative for chest pain, palpitations and leg swelling.       Objective:   Physical Exam Filed Vitals:   07/05/15  1641  BP: 126/68  Pulse: 60  Height:  (1.905 m)  Weight: 196 lb (88.905 kg)  SpO2: 98%   RA  Gen: well appearing HENT: OP clear, TM's clear, neck supple PULM: CTA B, normal percussion CV: Irreg irreg, no mgr, trace edema GI: BS+, soft, nontender Derm: no cyanosis or rash Psyche: normal mood and affect  Images from his CT chest were personally reviewed today in clinic, see my description above Pulmonary function testing personally reviewed with the patient today in clinic showing mild restriction and moderate airflow obstruction.      Assessment & Plan:  Centrilobular emphysema I have personally reviewed the images of his CT scan from July 2016. This showed mild centrilobular emphysema bilaterally. There was a question of some very mild nonspecific interstitial changes in the periphery of the right lung and some scant dependent changes in the left lung. From my standpoint these are unremarkable changes but worth following with at least one repeat CT scan in one year with a repeat lung function test.  Plan: Flu shot in the fall I've asked him to obtain records from his hospitalization to see if he's had the pneumonia vaccine Stay active Continue as needed albuterol  Abnormal CT scan, chest See discussion above. 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 We will repeat a CT chest in one year.  Multiple pulmonary nodules He had a few scattered small pulmonary nodules. These have been stable on 2 separate images. The radiologist has recommended a repeat CT chest in one year. We will plan for that.  Arrhythmia Today on exam he had frequent irregular heartbeats. It's unclear to me if this represents PVCs. Because of his history of arrhythmia in the past I will check an EKG today.     Current outpatient prescriptions:  .  Albuterol Sulfate (PROAIR RESPICLICK) 108 (90 BASE) MCG/ACT AEPB, Inhale 1 puff into the lungs every 6 (six) hours as needed (shortness of breath)., Disp: 1 each, Rfl: 0 .  aspirin EC 81 MG tablet, Take 81 mg by mouth daily., Disp: , Rfl:  .  atorvastatin (LIPITOR) 20 MG tablet, Take 1 tablet (20 mg total) by mouth daily., Disp: , Rfl:  .  carvedilol (COREG) 12.5 MG tablet, Take 1 tablet (12.5 mg total) by mouth 2 (two) times daily with a meal., Disp: 60 tablet, Rfl: 6 .  clopidogrel (PLAVIX) 75 MG tablet, Take 1 tablet (75 mg total) by mouth daily., Disp: 90 tablet, Rfl: 3 .  furosemide (LASIX) 40 MG tablet, Take 1 tablet (40 mg total) by mouth  daily as needed., Disp: 15 tablet, Rfl: 5 .  losartan (COZAAR) 25 MG tablet, Take 1 tablet (25 mg total) by mouth at bedtime., Disp: 90 tablet, Rfl: 3 .  nitroGLYCERIN (NITROSTAT) 0.4 MG SL tablet, Place 0.4 mg under the tongue every 5 (five) minutes as needed for chest pain., Disp: , Rfl:  .  spironolactone (ALDACTONE) 25 MG tablet, Take 1 tablet (25 mg total) by mouth daily., Disp: 90 tablet, Rfl: 3

## 2015-07-05 NOTE — Assessment & Plan Note (Signed)
Today on exam he had frequent irregular heartbeats. It's unclear to me if this represents PVCs. Because of his history of arrhythmia in the past I will check an EKG today.

## 2015-07-05 NOTE — Patient Instructions (Signed)
Continue exercising on a regular basis as you're doing Get a flu shot in the fall Let us know if you've had the pneumonia vaccine We will order a lung function test and a CT scan for one year from now to follow-up the pulmonary nodules We will see you back in one year or sooner if needed

## 2015-07-05 NOTE — Assessment & Plan Note (Signed)
See discussion above. 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 We will repeat a CT chest in one year.

## 2015-07-05 NOTE — Assessment & Plan Note (Signed)
I have personally reviewed the images of his CT scan from July 2016. This showed mild centrilobular emphysema bilaterally. There was a question of some very mild nonspecific interstitial changes in the periphery of the right lung and some scant dependent changes in the left lung. From my standpoint these are unremarkable changes but worth following with at least one repeat CT scan in one year with a repeat lung function test.  Plan: Flu shot in the fall I've asked him to obtain records from his hospitalization to see if he's had the pneumonia vaccine Stay active Continue as needed albuterol

## 2015-07-05 NOTE — Assessment & Plan Note (Signed)
He had a few scattered small pulmonary nodules. These have been stable on 2 separate images. The radiologist has recommended a repeat CT chest in one year. We will plan for that.

## 2015-07-23 DIAGNOSIS — Z23 Encounter for immunization: Secondary | ICD-10-CM | POA: Diagnosis not present

## 2015-07-23 DIAGNOSIS — I255 Ischemic cardiomyopathy: Secondary | ICD-10-CM | POA: Diagnosis not present

## 2015-07-23 DIAGNOSIS — Z1389 Encounter for screening for other disorder: Secondary | ICD-10-CM | POA: Diagnosis not present

## 2015-07-23 DIAGNOSIS — J432 Centrilobular emphysema: Secondary | ICD-10-CM | POA: Diagnosis not present

## 2015-07-23 DIAGNOSIS — I10 Essential (primary) hypertension: Secondary | ICD-10-CM | POA: Diagnosis not present

## 2015-07-23 DIAGNOSIS — Z9861 Coronary angioplasty status: Secondary | ICD-10-CM | POA: Diagnosis not present

## 2015-07-23 DIAGNOSIS — E039 Hypothyroidism, unspecified: Secondary | ICD-10-CM | POA: Diagnosis not present

## 2015-07-23 DIAGNOSIS — I5022 Chronic systolic (congestive) heart failure: Secondary | ICD-10-CM | POA: Diagnosis not present

## 2015-07-23 DIAGNOSIS — E785 Hyperlipidemia, unspecified: Secondary | ICD-10-CM | POA: Diagnosis not present

## 2015-07-23 DIAGNOSIS — I272 Other secondary pulmonary hypertension: Secondary | ICD-10-CM | POA: Diagnosis not present

## 2015-07-23 DIAGNOSIS — Z6824 Body mass index (BMI) 24.0-24.9, adult: Secondary | ICD-10-CM | POA: Diagnosis not present

## 2015-08-27 ENCOUNTER — Encounter: Payer: Self-pay | Admitting: Internal Medicine

## 2015-08-27 ENCOUNTER — Ambulatory Visit (INDEPENDENT_AMBULATORY_CARE_PROVIDER_SITE_OTHER): Payer: Medicare Other | Admitting: Internal Medicine

## 2015-08-27 VITALS — BP 100/64 | Ht 75.0 in | Wt 196.0 lb

## 2015-08-27 DIAGNOSIS — Z9861 Coronary angioplasty status: Secondary | ICD-10-CM

## 2015-08-27 DIAGNOSIS — I255 Ischemic cardiomyopathy: Secondary | ICD-10-CM

## 2015-08-27 DIAGNOSIS — I2510 Atherosclerotic heart disease of native coronary artery without angina pectoris: Secondary | ICD-10-CM | POA: Diagnosis not present

## 2015-08-27 DIAGNOSIS — I4901 Ventricular fibrillation: Secondary | ICD-10-CM

## 2015-08-27 DIAGNOSIS — I519 Heart disease, unspecified: Secondary | ICD-10-CM | POA: Diagnosis not present

## 2015-08-27 DIAGNOSIS — I241 Dressler's syndrome: Secondary | ICD-10-CM

## 2015-08-27 DIAGNOSIS — I472 Ventricular tachycardia: Secondary | ICD-10-CM | POA: Diagnosis not present

## 2015-08-27 DIAGNOSIS — I4729 Other ventricular tachycardia: Secondary | ICD-10-CM

## 2015-08-27 LAB — CUP PACEART INCLINIC DEVICE CHECK
Battery Remaining Longevity: 103.2 mo
Brady Statistic RV Percent Paced: 0.17 %
HIGH POWER IMPEDANCE MEASURED VALUE: 72 Ohm
Lead Channel Impedance Value: 362.5 Ohm
Lead Channel Pacing Threshold Amplitude: 0.5 V
Lead Channel Setting Pacing Pulse Width: 0.5 ms
MDC IDC MSMT LEADCHNL RV PACING THRESHOLD PULSEWIDTH: 0.5 ms
MDC IDC MSMT LEADCHNL RV SENSING INTR AMPL: 9.7 mV
MDC IDC SESS DTM: 20160919163108
MDC IDC SET LEADCHNL RV PACING AMPLITUDE: 2.5 V
MDC IDC SET LEADCHNL RV SENSING SENSITIVITY: 0.5 mV
Pulse Gen Serial Number: 7255600
Zone Setting Detection Interval: 250 ms
Zone Setting Detection Interval: 300 ms
Zone Setting Detection Interval: 350 ms

## 2015-08-27 NOTE — Progress Notes (Signed)
Electrophysiology Office Note   Date:  08/27/2015   ID:  Kyle Bullock, DOB 07/07/1951, MRN [id number]  PCP:  Martha Clan, MD  Cardiologist:  Dr Excell Seltzer Also followed in the CHF clinic also Primary Electrophysiologist: Hillis Range, MD    Chief Complaint  Patient presents with  . RBBB  . ICM  . Chronic systolic CHF     History of Present Illness: Kyle Bullock is a 64 y.o. male who presents today for electrophysiology evaluation.   Doing well s/p ICD implantation.  Very active and traveling with work.  Occasional SOB/ orthopnea.   Today, he denies symptoms of palpitations, chest pain,  PND, lower extremity edema, claudication, dizziness, presyncope, syncope, bleeding, or neurologic sequela. The patient istolerating medications without difficulties and is otherwise without complaint today.    Past Medical History  Diagnosis Date  . Hypertension   . Ischemic cardiomyopathy     a. 11/2014: EF reportedly 20% by cath and echo in [place].  . VF (ventricular fibrillation) 11/10/14    a. 11/2014: arrest with STEMI.  Marland Kitchen NSVT (nonsustained ventricular tachycardia) 11/16/14    a. 11/2014: admitted after discharge from STEMI admission, 18 beats 135-140bpm in ED.  Marland Kitchen CAD (coronary artery disease)     a. 11/2014: arrest/STEMI s/p Xience DES to the LAD and PTCA to the D1 [place], [place]). b. Relook cath 11/2014: no culprit, patent stent.  Marland Kitchen Dyspnea     a. 11/2014: possibly due to combo of CHF and Brilinta. Brilinta changed to Effient.  . Tobacco abuse   . RBBB   . Dyslipidemia   . Abnormal TSH     a. 11/2014: felt d/t sick euthyroid.  Marland Kitchen Pericarditis dx'd 11/2014  . Dressler's syndrome     a. 11/2014: tx with colchicine and prednisone.  . Pericardial effusion     a. 11/2014.  Marland Kitchen Erectile dysfunction     a. Pt aware not to take ED med within 24 hr of NTG and vice versa.  Marland Kitchen AICD (automatic cardioverter/defibrillator) present 02/28/2015  . CHF (congestive heart failure)   .  STEMI (ST elevation myocardial infarction) 11/10/2014  . Pneumonia 1990's X 2  . Sleep apnea     suspected but not diagnosed (02/28/2015)   Past Surgical History  Procedure Laterality Date  . Hand surgery Bilateral     Dupuytren's Contracture  . Knee arthroscopy Right 1982  . Left heart cath N/A 11/19/2014    Procedure: LEFT HEART CATH;  Surgeon: Runell Gess, MD;  Location: [facility] CATH LAB;  Service: Cardiovascular;  Laterality: N/A;  . Cardiac defibrillator placement  02/28/2015  . Coronary angioplasty with stent placement  11/10/2014    LAD DES  . Cardiac catheterization  11/19/2014  . Implantable cardioverter defibrillator implant N/A 02/28/2015    SJM Fortify Assura VR ICD implanted by Dr Johney Frame.  Revascularized following VF arrest, EF remained depressed and ICD implanted     Current Outpatient Prescriptions  Medication Sig Dispense Refill  . Albuterol Sulfate (PROAIR RESPICLICK) 108 (90 BASE) MCG/ACT AEPB Inhale 1 puff into the lungs every 6 (six) hours as needed (shortness of breath). 1 each 0  . aspirin EC 81 MG tablet Take 81 mg by mouth daily.    Marland Kitchen atorvastatin (LIPITOR) 20 MG tablet Take 1 tablet (20 mg total) by mouth daily.    . carvedilol (COREG) 12.5 MG tablet Take 1 tablet (12.5 mg total) by mouth 2 (two) times daily with a meal. 60 tablet 6  .  clopidogrel (PLAVIX) 75 MG tablet Take 1 tablet (75 mg total) by mouth daily. 90 tablet 3  . furosemide (LASIX) 40 MG tablet Take 40 mg by mouth daily as needed (weight gain).    Marland Kitchen losartan (COZAAR) 25 MG tablet Take 1 tablet (25 mg total) by mouth at bedtime. 90 tablet 3  . nitroGLYCERIN (NITROSTAT) 0.4 MG SL tablet Place 0.4 mg under the tongue every 5 (five) minutes as needed for chest pain.    Marland Kitchen spironolactone (ALDACTONE) 25 MG tablet Take 1 tablet (25 mg total) by mouth daily. 90 tablet 3   No current facility-administered medications for this visit.    Allergies:   Ambien; Lisinopril; and Brilinta   Social History:  The  patient  reports that he quit smoking about 9 months ago. His smoking use included Cigarettes. He has a 80 pack-year smoking history. He has never used smokeless tobacco. He reports that he drinks about 8.4 oz of alcohol per week. He reports that he does not use illicit drugs.   Family History:  The patient's family history includes COPD in his mother; Hypertension in his father and mother. There is no history of CAD.    ROS:  Please see the history of present illness.   All other systems are reviewed and negative.    PHYSICAL EXAM: VS:  BP 100/64 mmHg  Ht  (1.905 m)  Wt 196 lb (88.905 kg)  BMI 24.50 kg/m2  SpO2 93% , BMI Body mass index is 24.5 kg/(m^2). GEN: Well nourished, well developed, in no acute distress HEENT: normal Neck: no JVD, carotid bruits, or masses Cardiac: RRR; no murmurs, rubs, or gallops,no edema  Respiratory:  clear to auscultation bilaterally, normal work of breathing GI: soft, nontender, nondistended, + BS MS: no deformity or atrophy Skin: warm and dry, device pocket is well healed Neuro:  Strength and sensation are intact Psych: euthymic mood, full affect  Device interrogation is reviewed today in detail.  See PaceArt for details.   Recent Labs: 11/24/2014: Magnesium 2.4 01/29/2015: ALT 47; TSH 5.75* 02/07/2015: Pro B Natriuretic peptide (BNP) 1442.0* 02/26/2015: Hemoglobin 13.9; Platelets 271.0 04/11/2015: BUN 18; Creatinine, Ser 1.13; Potassium 5.2*; Sodium 138    Lipid Panel     Component Value Date/Time   CHOL 108 01/08/2015 0850   CHOL 101 12/04/2014 1030   TRIG 155.0* 01/08/2015 0850   HDL 39.00* 01/08/2015 0850   HDL 43 12/04/2014 1030   CHOLHDL 3 01/08/2015 0850   CHOLHDL 2.3 12/04/2014 1030   VLDL 31.0 01/08/2015 0850   LDLCALC 38 01/08/2015 0850   LDLCALC 39 12/04/2014 1030     Wt Readings from Last 3 Encounters:  08/27/15 196 lb (88.905 kg)  07/05/15 196 lb (88.905 kg)  06/05/15 198 lb (89.812 kg)     ASSESSMENT AND  PLAN:  1.  Ischemic CM/ chronic systolic dysfunction/ CAD euvolemic today, no ischemic symptoms Normal ICD function See Pace Art report No changes today I have offered enrollment in ICM device clinic which he declines at this time.  Follow-up: merlin,  Return to see EP MD in 1 year  Current medicines are reviewed at length with the patient today.   The patient does not have concerns regarding his medicines.  The following changes were made today:  none   Signed, Hillis Range, MD  08/27/2015 3:40 PM     [facility] [address] [place] [address] [phone] (office) [phone] (fax)

## 2015-08-27 NOTE — Patient Instructions (Addendum)
Medication Instructions:  Your physician recommends that you continue on your current medications as directed. Please refer to the Current Medication list given to you today.   Labwork: None ordered  Testing/Procedures: None ordered  Follow-Up: Your physician wants you to follow-up in: 12 months with Dr Johney Frame Bonita Quin will receive a reminder letter in the mail two months in advance. If you don't receive a letter, please call our office to schedule the follow-up appointment.  Remote monitoring is used to monitor your  ICD from home. This monitoring reduces the number of office visits required to check your device to one time per year. It allows [place] to keep an eye on the functioning of your device to ensure it is working properly. You are scheduled for a device check from home on 11/26/15. You may send your transmission at any time that day. If you have a wireless device, the transmission will be sent automatically. After your physician reviews your transmission, you will receive a postcard with your next transmission date.     Any Other Special Instructions Will Be Listed Below (If Applicable).

## 2015-09-16 DIAGNOSIS — Z23 Encounter for immunization: Secondary | ICD-10-CM | POA: Diagnosis not present

## 2015-09-18 ENCOUNTER — Telehealth (HOSPITAL_COMMUNITY): Payer: Self-pay | Admitting: *Deleted

## 2015-09-18 NOTE — Telephone Encounter (Signed)
Fax sent over for Plavix questions for teeth extraction. Will get MD to look at and follow up.

## 2015-09-18 NOTE — Telephone Encounter (Signed)
Dr Excell Seltzer follows pt. Will fax to [facility] office

## 2015-11-12 ENCOUNTER — Telehealth (HOSPITAL_COMMUNITY): Payer: Self-pay

## 2015-11-12 NOTE — Telephone Encounter (Signed)
Patient having dental procedure/extraction and recommended by dentist to stop plavix 3 days before procedure and resume next day.  Will forward to MD to advise.  Ave FilterBradley, Megan Genevea

## 2015-11-13 ENCOUNTER — Other Ambulatory Visit (HOSPITAL_COMMUNITY): Payer: Self-pay | Admitting: *Deleted

## 2015-11-13 MED ORDER — CARVEDILOL 12.5 MG PO TABS: 12.5000 mg | ORAL_TABLET | Freq: Two times a day (BID) | ORAL | Status: DC

## 2015-11-13 NOTE — Telephone Encounter (Signed)
He is 1 year out from stent. Ok to stop plavix for 3 days with only small amount of risk. Restart ASAP after procedure.

## 2015-11-26 ENCOUNTER — Ambulatory Visit (INDEPENDENT_AMBULATORY_CARE_PROVIDER_SITE_OTHER): Payer: Medicare Other | Admitting: *Deleted

## 2015-11-26 DIAGNOSIS — I255 Ischemic cardiomyopathy: Secondary | ICD-10-CM | POA: Diagnosis not present

## 2015-11-26 DIAGNOSIS — J029 Acute pharyngitis, unspecified: Secondary | ICD-10-CM | POA: Diagnosis not present

## 2015-11-26 DIAGNOSIS — Z6825 Body mass index (BMI) 25.0-25.9, adult: Secondary | ICD-10-CM | POA: Diagnosis not present

## 2015-11-26 DIAGNOSIS — J209 Acute bronchitis, unspecified: Secondary | ICD-10-CM | POA: Diagnosis not present

## 2015-11-26 LAB — CUP PACEART REMOTE DEVICE CHECK
Battery Remaining Longevity: 100 mo
Battery Remaining Percentage: 92 %
Brady Statistic RV Percent Paced: 1 %
HighPow Impedance: 64 Ohm
HighPow Impedance: 64 Ohm
Lead Channel Impedance Value: 350 Ohm
Lead Channel Sensing Intrinsic Amplitude: 11.9 mV
Lead Channel Setting Pacing Amplitude: 2.5 V
Lead Channel Setting Pacing Pulse Width: 0.5 ms
MDC IDC LEAD IMPLANT DT: 20160323
MDC IDC LEAD LOCATION: 753860
MDC IDC MSMT BATTERY VOLTAGE: 3.16 V
MDC IDC SESS DTM: 20161219070016
MDC IDC SET LEADCHNL RV SENSING SENSITIVITY: 0.5 mV
Pulse Gen Serial Number: 7255600

## 2015-11-26 NOTE — Progress Notes (Signed)
Remote ICD transmission.   

## 2015-11-29 ENCOUNTER — Encounter: Payer: Self-pay | Admitting: Cardiology

## 2015-12-05 DIAGNOSIS — Z6825 Body mass index (BMI) 25.0-25.9, adult: Secondary | ICD-10-CM | POA: Diagnosis not present

## 2015-12-05 DIAGNOSIS — J209 Acute bronchitis, unspecified: Secondary | ICD-10-CM | POA: Diagnosis not present

## 2015-12-05 DIAGNOSIS — R05 Cough: Secondary | ICD-10-CM | POA: Diagnosis not present

## 2015-12-07 DIAGNOSIS — I10 Essential (primary) hypertension: Secondary | ICD-10-CM | POA: Diagnosis not present

## 2015-12-07 DIAGNOSIS — E038 Other specified hypothyroidism: Secondary | ICD-10-CM | POA: Diagnosis not present

## 2015-12-07 DIAGNOSIS — Z125 Encounter for screening for malignant neoplasm of prostate: Secondary | ICD-10-CM | POA: Diagnosis not present

## 2015-12-07 DIAGNOSIS — E784 Other hyperlipidemia: Secondary | ICD-10-CM | POA: Diagnosis not present

## 2015-12-13 ENCOUNTER — Encounter (HOSPITAL_COMMUNITY): Payer: Self-pay | Admitting: Internal Medicine

## 2015-12-13 ENCOUNTER — Ambulatory Visit (HOSPITAL_COMMUNITY)
Admission: RE | Admit: 2015-12-13 | Discharge: 2015-12-13 | Disposition: A | Discharge status: Home / Self Care | Payer: PPO | Source: Ambulatory Visit | Attending: Internal Medicine | Admitting: Internal Medicine

## 2015-12-13 VITALS — BP 106/60 | HR 65 | Wt 199.5 lb

## 2015-12-13 DIAGNOSIS — J439 Emphysema, unspecified: Secondary | ICD-10-CM | POA: Insufficient documentation

## 2015-12-13 DIAGNOSIS — Z7902 Long term (current) use of antithrombotics/antiplatelets: Secondary | ICD-10-CM | POA: Diagnosis not present

## 2015-12-13 DIAGNOSIS — Z7982 Long term (current) use of aspirin: Secondary | ICD-10-CM | POA: Insufficient documentation

## 2015-12-13 DIAGNOSIS — F17200 Nicotine dependence, unspecified, uncomplicated: Secondary | ICD-10-CM | POA: Insufficient documentation

## 2015-12-13 DIAGNOSIS — I519 Heart disease, unspecified: Secondary | ICD-10-CM | POA: Insufficient documentation

## 2015-12-13 DIAGNOSIS — I2510 Atherosclerotic heart disease of native coronary artery without angina pectoris: Secondary | ICD-10-CM | POA: Diagnosis not present

## 2015-12-13 DIAGNOSIS — I5022 Chronic systolic (congestive) heart failure: Secondary | ICD-10-CM | POA: Insufficient documentation

## 2015-12-13 DIAGNOSIS — Z9861 Coronary angioplasty status: Secondary | ICD-10-CM | POA: Insufficient documentation

## 2015-12-13 DIAGNOSIS — I255 Ischemic cardiomyopathy: Secondary | ICD-10-CM | POA: Diagnosis not present

## 2015-12-13 DIAGNOSIS — Z95810 Presence of automatic (implantable) cardiac defibrillator: Secondary | ICD-10-CM | POA: Insufficient documentation

## 2015-12-13 DIAGNOSIS — Z79899 Other long term (current) drug therapy: Secondary | ICD-10-CM | POA: Insufficient documentation

## 2015-12-13 NOTE — Progress Notes (Signed)
Patient ID: Kyle Bullock, male   DOB: 04/06/1951, 65 y.o.   MRN: [id number]    Advanced HF Clinic Note   History of Present Illness: Kyle Bullock is a 65 y.o. male who is referred to the HF Clinic by Dr. Excell Seltzer. He has h/o CAD and ischemic cardiomyopathy 20-25%.  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    In December 2015, he suffered an acute anterolateral MI complicated by VF arrest in [place]. He underwent drug-eluting stent placement to the LAD and PTCA of the diagonal. EF was 25% and a life vest was placed prior to discharge. Following return to [place], he developed dyspnea and presented to [facility] where he was readmitted. His Brilinta was switched to Effient as it was felt the Brilinta may have been playing a role in his dyspnea. He was also switched from an ACE inhibitor to an ARB. He was seen by electrophysiology with recommendation for ongoing life vest therapy and follow-up echo in 90 days post revascularization. He was then seen in clinic in mid December where he reported pleuritic chest pain. He was readmitted and underwent diagnostic catheterization revealing patency of previously placed stent and area of balloon angioplasty in the diagonal. EF was 20-25% with diffuse hypokinesis and normal pericardial appearance. He did have some hypotension requiring discontinuation of ARB therapy. His beta blocker was also down titrated. He was diagnosed with Dressler's syndrome and was placed on prednisone taper and colchicine.  We last saw him in 6/16 was doing well. CPX and CT with COPD. Referred to Dr. Kendrick Fries who felt he mad mild COPD but no ILD. Given PRN albuterol. Has repeat CT scheduled for 1 year.   He returns for heart failure follow up. Doing great. Working [employer].  Walking at least 2.5 miles daily in 50 minutes. Denies SOB/PND/Orthopnea. Weight at home 195-196 pounds (gained 6-7 pounds). Taking all medications. Following low salt and low fat diet. Remains  nicotine free. BP drops into 90s after taking meds.   Echo 3/16 EF 25-30%  CPX 05/14/2015  Peak VO2: 19.0 (64.5% predicted peak VO2) VE/VCO2 slope: 41.5 OUES: 2.01 Peak RER: 1.06 Ventilatory Threshold: 14.1     (47.9% predicted or measured peak VO2)   Labs  04/10/2014: K 5.2 Creatinine 1.13    Past Medical History  Diagnosis Date  . Hypertension   . Ischemic cardiomyopathy     a. 11/2014: EF reportedly 20% by cath and echo in [place].  . VF (ventricular fibrillation) (HCC) 11/10/14    a. 11/2014: arrest with STEMI.  Marland Kitchen NSVT (nonsustained ventricular tachycardia) (HCC) 11/16/14    a. 11/2014: admitted after discharge from STEMI admission, 18 beats 135-140bpm in ED.  Marland Kitchen CAD (coronary artery disease)     a. 11/2014: arrest/STEMI s/p Xience DES to the LAD and PTCA to the D1 [place], [place]). b. Relook cath 11/2014: no culprit, patent stent.  Marland Kitchen Dyspnea     a. 11/2014: possibly due to combo of CHF and Brilinta. Brilinta changed to Effient.  . Tobacco abuse   . RBBB   . Dyslipidemia   . Abnormal TSH     a. 11/2014: felt d/t sick euthyroid.  Marland Kitchen Pericarditis dx'd 11/2014  . Dressler's syndrome (HCC)     a. 11/2014: tx with colchicine and prednisone.  . Pericardial effusion     a. 11/2014.  Marland Kitchen Erectile dysfunction     a. Pt aware not to take ED med within 24 hr of NTG and  vice versa.  Marland Kitchen. AICD (automatic cardioverter/defibrillator) present 02/28/2015  . CHF (congestive heart failure) (HCC)   . STEMI (ST elevation myocardial infarction) (HCC) 11/10/2014  . Pneumonia 1990's X 2  . Sleep apnea     suspected but not diagnosed (02/28/2015)    Past Surgical History  Procedure Laterality Date  . Hand surgery Bilateral     Dupuytren's Contracture  . Knee arthroscopy Right 1982  . Left heart cath N/A 11/19/2014    Procedure: LEFT HEART CATH;  Surgeon: Runell GessJonathan J Berry, MD;  [facility];  Service: Cardiovascular;  Laterality: N/A;  . Cardiac defibrillator placement  02/28/2015    . Coronary angioplasty with stent placement  11/10/2014    LAD DES  . Cardiac catheterization  11/19/2014  . Implantable cardioverter defibrillator implant N/A 02/28/2015    SJM Fortify Assura VR ICD implanted by Dr Johney FrameAllred.  Revascularized following VF arrest, EF remained depressed and ICD implanted    Current Outpatient Prescriptions  Medication Sig Dispense Refill  . Albuterol Sulfate (PROAIR RESPICLICK) 108 (90 BASE) MCG/ACT AEPB Inhale 1 puff into the lungs every 6 (six) hours as needed (shortness of breath). 1 each 0  . aspirin EC 81 MG tablet Take 81 mg by mouth daily.    Marland Kitchen. atorvastatin (LIPITOR) 20 MG tablet Take 1 tablet (20 mg total) by mouth daily.    . benzonatate (TESSALON) 100 MG capsule Take 100 mg by mouth 3 (three) times daily as needed for cough.    . carvedilol (COREG) 12.5 MG tablet Take 1 tablet (12.5 mg total) by mouth 2 (two) times daily with a meal. 60 tablet 6  . cefdinir (OMNICEF) 300 MG capsule Take 300 mg by mouth 2 (two) times daily.    . clopidogrel (PLAVIX) 75 MG tablet Take 1 tablet (75 mg total) by mouth daily. 90 tablet 3  . furosemide (LASIX) 40 MG tablet Take 40 mg by mouth daily as needed (weight gain).    Marland Kitchen. losartan (COZAAR) 25 MG tablet Take 1 tablet (25 mg total) by mouth at bedtime. 90 tablet 3  . spironolactone (ALDACTONE) 25 MG tablet Take 1 tablet (25 mg total) by mouth daily. 90 tablet 3  . nitroGLYCERIN (NITROSTAT) 0.4 MG SL tablet Place 0.4 mg under the tongue every 5 (five) minutes as needed for chest pain. Reported on 12/13/2015     No current facility-administered medications for this encounter.    Allergies:   Ambien; Lisinopril; and Brilinta   Social History:  The patient  reports that he quit smoking about 13 months ago. His smoking use included Cigarettes. He has a 80 pack-year smoking history. He has never used smokeless tobacco. He reports that he drinks about 8.4 oz of alcohol per week. He reports that he does not use illicit drugs.    Family History:  The patient's  family history includes COPD in his mother; Hypertension in his father and mother. There is no history of CAD.    ROS:  Please see the history of present illness.  Otherwise, review of systems is positive for shortness of breath.  All other systems are reviewed and negative.    PHYSICAL EXAM: VS:  BP 106/60 mmHg  Pulse 65  Wt 199 lb 8 oz (90.493 kg)  SpO2 98% , BMI Body mass index is 24.94 kg/(m^2).  GEN: Well nourished, well developed, in no acute distress HEENT: normal Neck: no JVD, no masses, no carotid bruits Cardiac: RRR without murmur or gallop. ICD site  looks good.             Respiratory:  clear to auscultation bilaterally, normal work of breathing GI: soft, nontender, nondistended, + BS MS: no deformity or atrophy Ext: no pretibial edema Skin: warm and dry, no rash Neuro:  Strength and sensation are intact Psych: euthymic mood, full affect   Recent Labs: 01/29/2015: ALT 47; TSH 5.75* 02/07/2015: Pro B Natriuretic peptide (BNP) 1442.0* 02/26/2015: Hemoglobin 13.9; Platelets 271.0 04/11/2015: BUN 18; Creatinine, Ser 1.13; Potassium 5.2*; Sodium 138   Lipid Panel     Component Value Date/Time   CHOL 108 01/08/2015 0850   CHOL 101 12/04/2014 1030   TRIG 155.0* 01/08/2015 0850   HDL 39.00* 01/08/2015 0850   HDL 43 12/04/2014 1030   CHOLHDL 3 01/08/2015 0850   CHOLHDL 2.3 12/04/2014 1030   VLDL 31.0 01/08/2015 0850   LDLCALC 38 01/08/2015 0850   LDLCALC 39 12/04/2014 1030      Wt Readings from Last 3 Encounters:  12/13/15 199 lb 8 oz (90.493 kg)  08/27/15 196 lb (88.905 kg)  07/05/15 196 lb (88.905 kg)     Cardiac Studies Reviewed: 2D Echo 11/13/2014: Study Conclusions  - Left ventricle: The cavity size was normal. Wall thickness was normal. Systolic function was severely reduced. The estimated ejection fraction was in the range of 20% to 25%. Diastolic dysfunction with elevated LV Filling pressure.  Diffuse hypokinesis. - Mitral valve: Mildly thickened leaflets . There was mild regurgitation. - Left atrium: The atrium was at the upper limits of normal in size. - Right atrium: The atrium was mildly dilated. - Pericardium, extracardiac: Small circumferential pericardial effusion. Tamponade physiology is not suspect, but cannot be ruled-out on the basis of this study.  Impressions:  - Compared to the prior echo in 11/2014, there is now a small circumferential pericardial effusion - no clear tamponade physiology, however, the IVC is dilated. There has been no improvement in LVEF.  ASSESSMENT AND PLAN:  1. Chronic systolic due to ischemic CM - HF EF 25-30% echo 3/16.     --Currently NYHA I. Volume status looks good. Continue prn lasix.   - Continue losartan to 25 daily. BP too low to increase or switch to Entresto - Continue carvedilol 12.5 mg twice a day . Will not increase with heart rate 61 and soft BP.  - Continue sprio 25 mg daily.   - Will repeat echo  - Reviewed recent ST Jude interrogated device. Corevue ok. No VT. Could not find PVC counter but last check said < 10% 2.  CAD s/p Anterior MI December 2015:  --No recurrent angina. He will continue on ASA, statin and Effient per Dr. Excell Seltzer Lipids per Dr. Eric Form. Goal LDL < 70 3. Tobacco abuse:  --complete smoking cessation (quit at time of MI). 5. Emphysema - follows with [facility]   [name], Daniel,MD 12:21 PM

## 2015-12-13 NOTE — Patient Instructions (Signed)
FOLLOW UP in 6 months with Dr.[name]  Your provider requests you have an Echocardiogram before your 6 month follow up.

## 2015-12-14 ENCOUNTER — Telehealth (HOSPITAL_COMMUNITY): Payer: Self-pay

## 2015-12-14 DIAGNOSIS — I255 Ischemic cardiomyopathy: Secondary | ICD-10-CM | POA: Diagnosis not present

## 2015-12-14 DIAGNOSIS — Z6824 Body mass index (BMI) 24.0-24.9, adult: Secondary | ICD-10-CM | POA: Diagnosis not present

## 2015-12-14 DIAGNOSIS — E038 Other specified hypothyroidism: Secondary | ICD-10-CM | POA: Diagnosis not present

## 2015-12-14 DIAGNOSIS — J432 Centrilobular emphysema: Secondary | ICD-10-CM | POA: Diagnosis not present

## 2015-12-14 DIAGNOSIS — E784 Other hyperlipidemia: Secondary | ICD-10-CM | POA: Diagnosis not present

## 2015-12-14 DIAGNOSIS — I5022 Chronic systolic (congestive) heart failure: Secondary | ICD-10-CM | POA: Diagnosis not present

## 2015-12-14 DIAGNOSIS — I10 Essential (primary) hypertension: Secondary | ICD-10-CM | POA: Diagnosis not present

## 2015-12-14 DIAGNOSIS — Z9861 Coronary angioplasty status: Secondary | ICD-10-CM | POA: Diagnosis not present

## 2015-12-14 DIAGNOSIS — Z1389 Encounter for screening for other disorder: Secondary | ICD-10-CM | POA: Diagnosis not present

## 2015-12-14 DIAGNOSIS — Z23 Encounter for immunization: Secondary | ICD-10-CM | POA: Diagnosis not present

## 2015-12-14 DIAGNOSIS — Z0000 Encounter for general adult medical examination without abnormal findings: Secondary | ICD-10-CM | POA: Diagnosis not present

## 2015-12-14 DIAGNOSIS — I272 Other secondary pulmonary hypertension: Secondary | ICD-10-CM | POA: Diagnosis not present

## 2015-12-14 MED ORDER — ATORVASTATIN CALCIUM 20 MG PO TABS: 20.0000 mg | ORAL_TABLET | Freq: Every day | ORAL | Status: DC

## 2015-12-14 NOTE — Telephone Encounter (Signed)
Wife called for refill on lipitor.  30 day supply sent electronically to preferred pharmacy as requested by patient's wife.  Ave FilterBradley, Megan Genevea

## 2015-12-18 ENCOUNTER — Encounter: Payer: Self-pay | Admitting: Internal Medicine

## 2015-12-24 DIAGNOSIS — Z1212 Encounter for screening for malignant neoplasm of rectum: Secondary | ICD-10-CM | POA: Diagnosis not present

## 2015-12-27 DIAGNOSIS — M79644 Pain in right finger(s): Secondary | ICD-10-CM | POA: Diagnosis not present

## 2015-12-27 DIAGNOSIS — M65331 Trigger finger, right middle finger: Secondary | ICD-10-CM | POA: Diagnosis not present

## 2016-01-06 ENCOUNTER — Other Ambulatory Visit: Payer: Self-pay | Admitting: Physician Assistant

## 2016-01-07 ENCOUNTER — Ambulatory Visit (HOSPITAL_COMMUNITY)
Admission: RE | Admit: 2016-01-07 | Discharge: 2016-01-07 | Disposition: A | Discharge status: Home / Self Care | Payer: PPO | Source: Ambulatory Visit | Attending: Surgery | Admitting: Surgery

## 2016-01-07 ENCOUNTER — Other Ambulatory Visit (HOSPITAL_COMMUNITY): Payer: Self-pay | Admitting: Internal Medicine

## 2016-01-07 DIAGNOSIS — Z95810 Presence of automatic (implantable) cardiac defibrillator: Secondary | ICD-10-CM | POA: Insufficient documentation

## 2016-01-07 DIAGNOSIS — I4510 Unspecified right bundle-branch block: Secondary | ICD-10-CM | POA: Diagnosis not present

## 2016-01-07 DIAGNOSIS — Z139 Encounter for screening, unspecified: Secondary | ICD-10-CM | POA: Diagnosis not present

## 2016-01-07 DIAGNOSIS — I10 Essential (primary) hypertension: Secondary | ICD-10-CM | POA: Diagnosis not present

## 2016-01-07 DIAGNOSIS — I2510 Atherosclerotic heart disease of native coronary artery without angina pectoris: Secondary | ICD-10-CM | POA: Insufficient documentation

## 2016-01-07 DIAGNOSIS — E785 Hyperlipidemia, unspecified: Secondary | ICD-10-CM | POA: Insufficient documentation

## 2016-01-07 DIAGNOSIS — F17200 Nicotine dependence, unspecified, uncomplicated: Secondary | ICD-10-CM | POA: Diagnosis not present

## 2016-01-08 ENCOUNTER — Other Ambulatory Visit: Payer: Self-pay | Admitting: Cardiology

## 2016-01-24 DIAGNOSIS — M65331 Trigger finger, right middle finger: Secondary | ICD-10-CM | POA: Diagnosis not present

## 2016-01-27 ENCOUNTER — Other Ambulatory Visit: Payer: Self-pay | Admitting: Internal Medicine

## 2016-01-30 DIAGNOSIS — N5201 Erectile dysfunction due to arterial insufficiency: Secondary | ICD-10-CM | POA: Diagnosis not present

## 2016-01-30 DIAGNOSIS — K4090 Unilateral inguinal hernia, without obstruction or gangrene, not specified as recurrent: Secondary | ICD-10-CM | POA: Diagnosis not present

## 2016-02-07 ENCOUNTER — Other Ambulatory Visit: Payer: Self-pay | Admitting: Cardiovascular Disease

## 2016-02-18 IMAGING — DX DG CHEST 2V
2 series · 2 of 2 positions shown · non-contrast
Comparison: None.

CLINICAL DATA: Chest pain and short of breath.  Left rib pain

EXAM:
CHEST  2 VIEW

[chest pa]
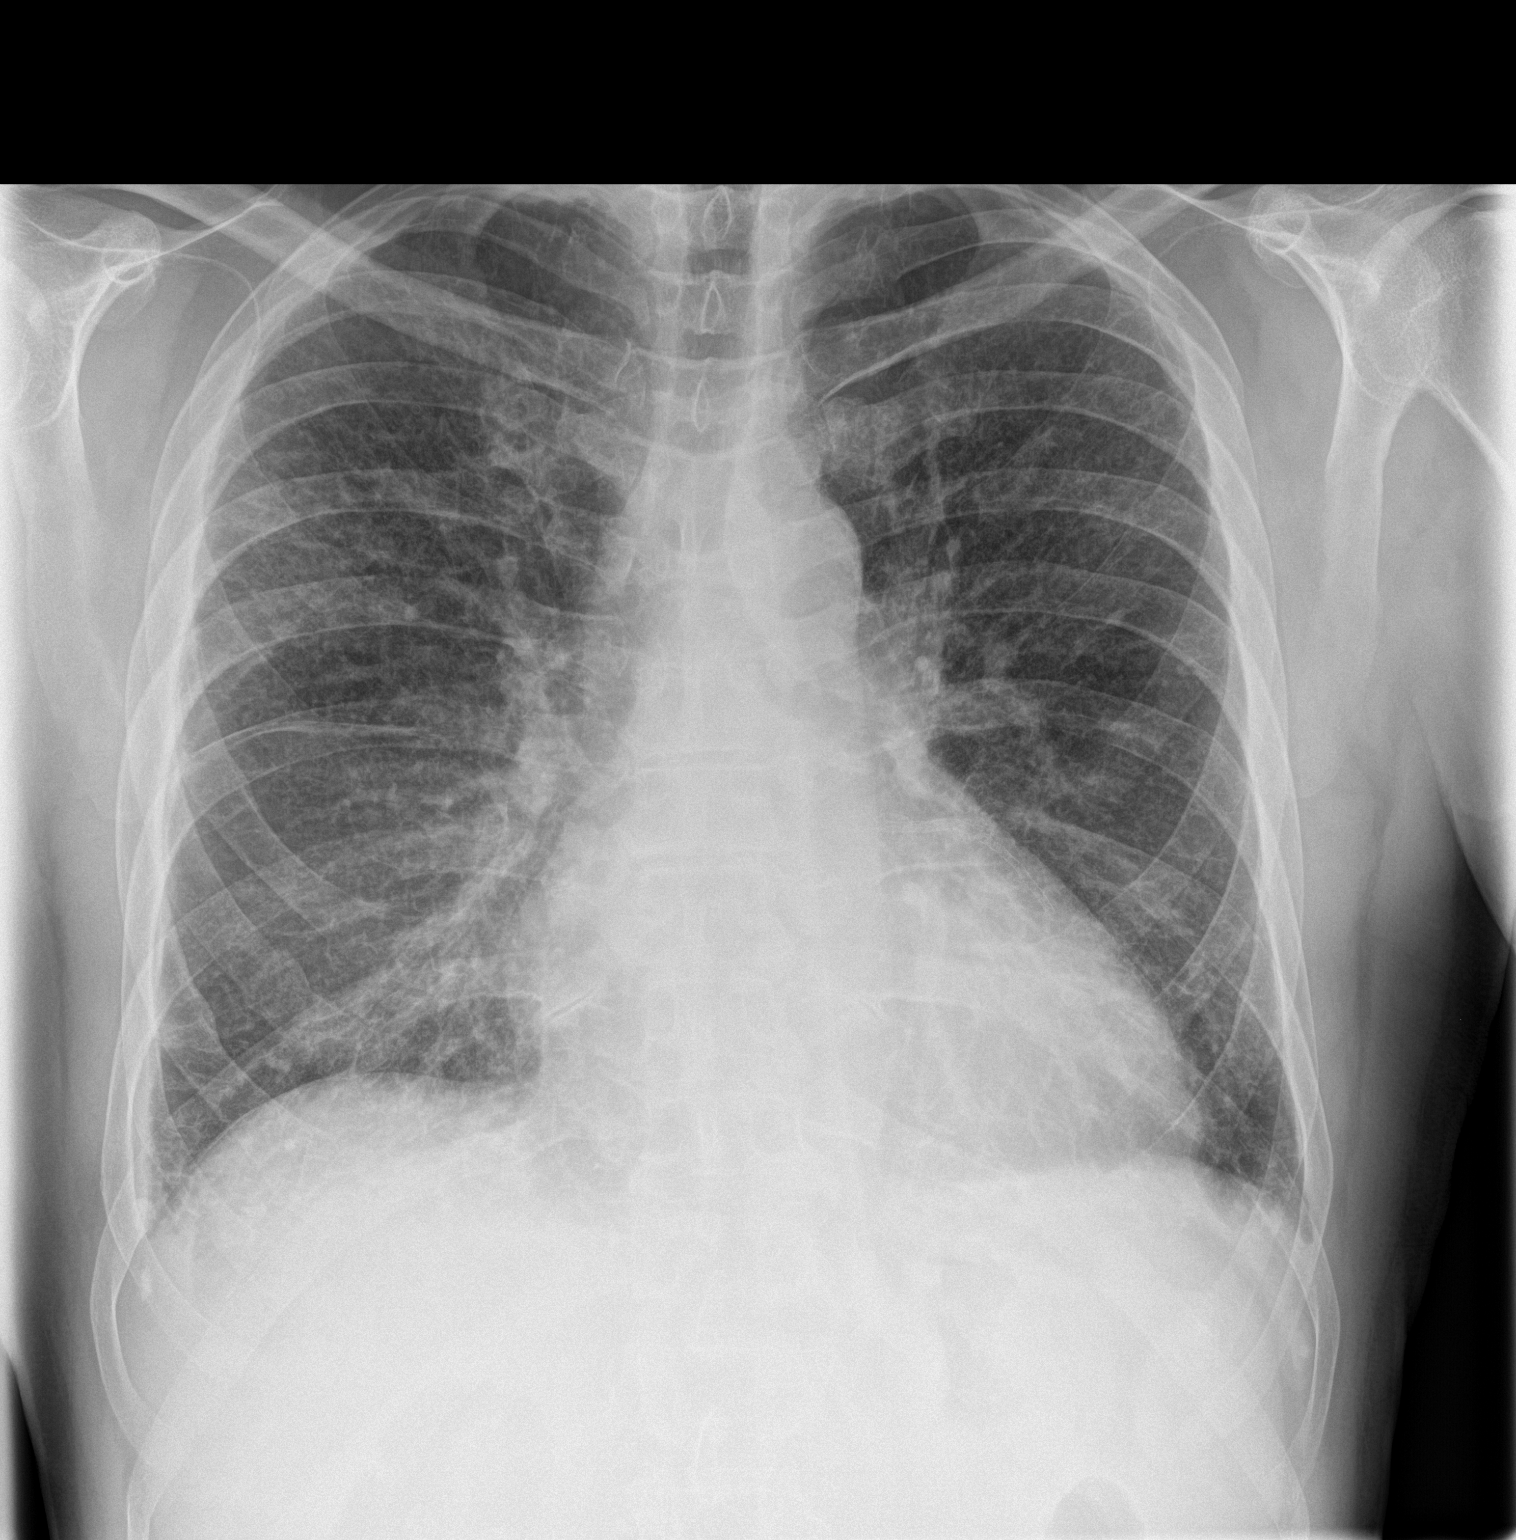

[chest lat]
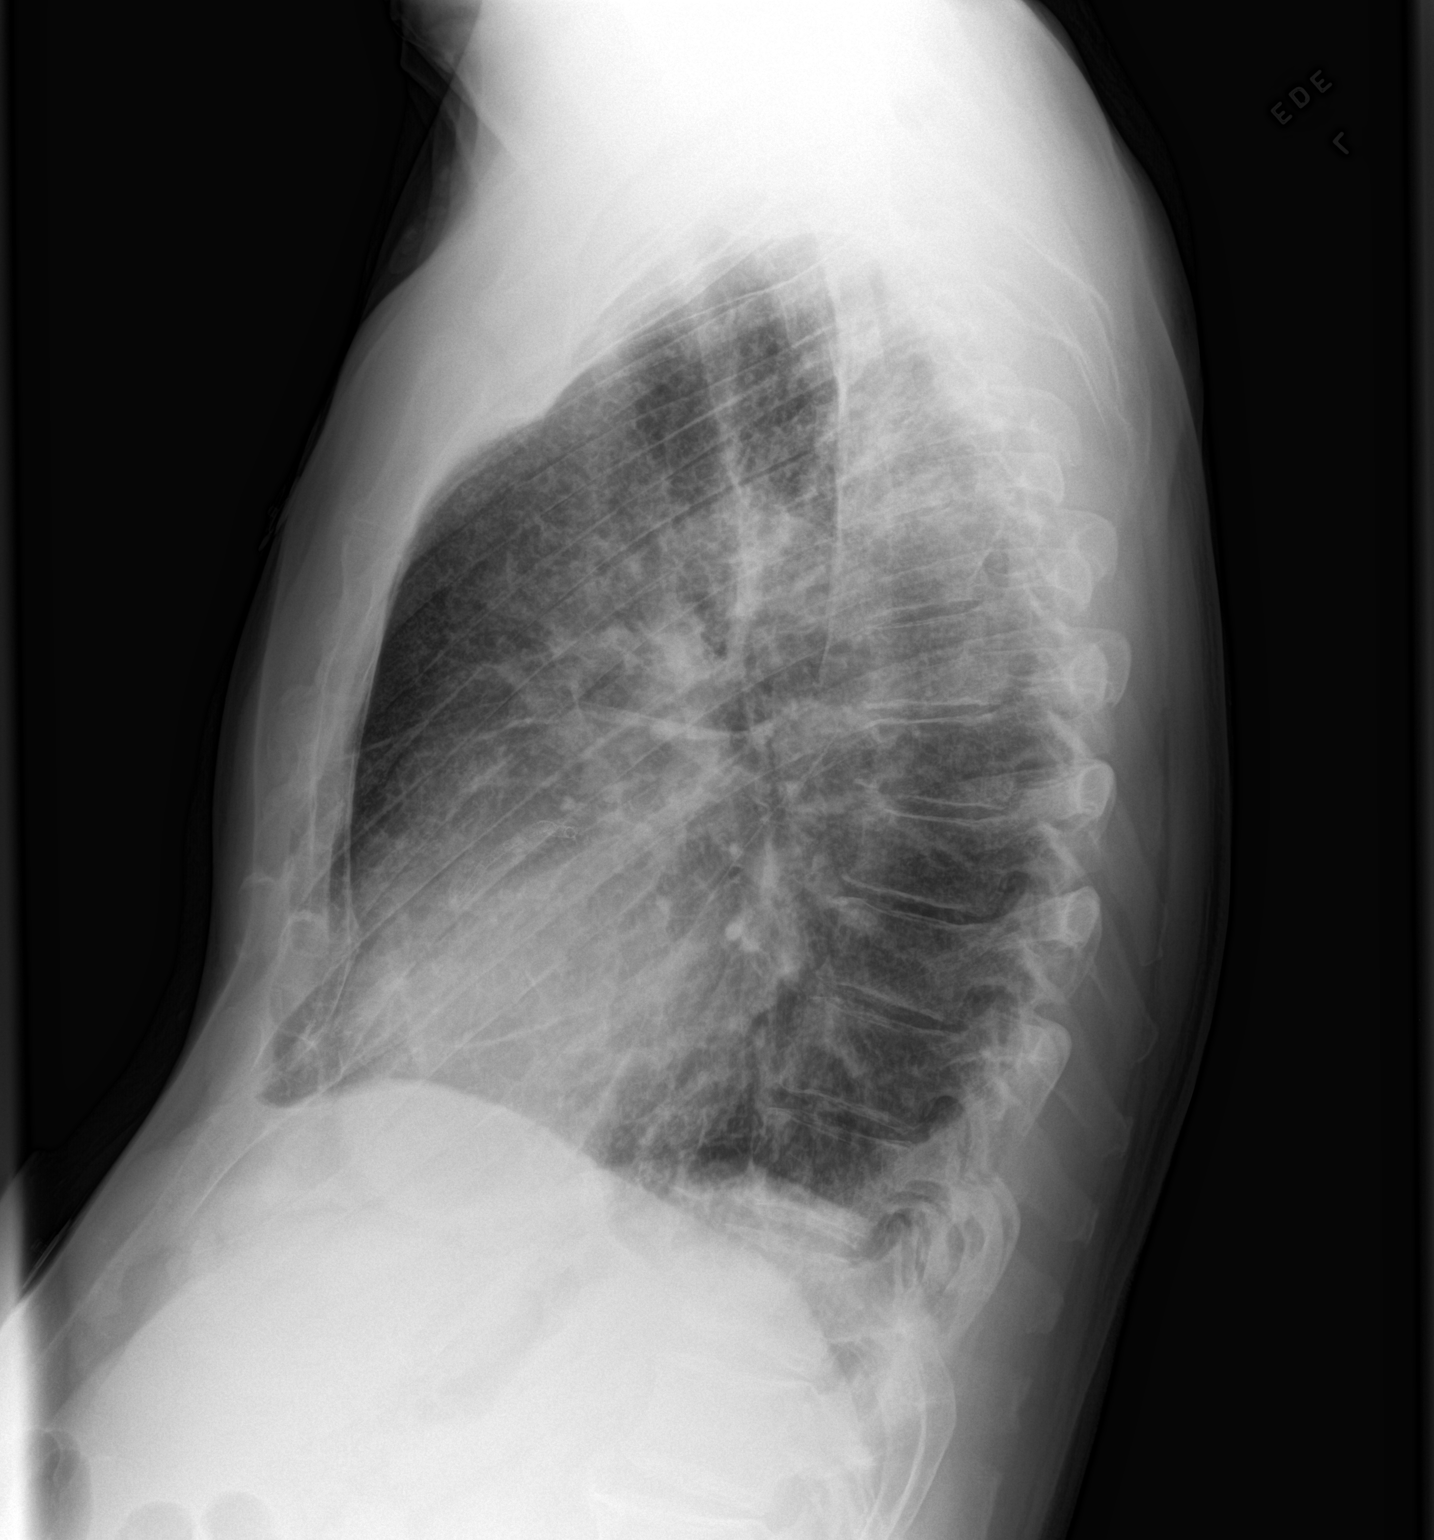

[2 of 2 positions shown; findings below may reference images not displayed]

FINDINGS: Cardiac enlargement. Diffusely increased lung markings which could
be due to interstitial edema or chronic scarring. Small right
effusion and Kerley B-lines are noted suggesting mild fluid
overload. Left coronary stent noted. Negative for pneumonia. Apical
scarring bilaterally.
IMPRESSION: Cardiac enlargement with prominent interstitial markings and
probable mild interstitial edema. There may be an element of chronic
lung disease present as well.

## 2016-02-18 IMAGING — DX DG RIBS 2V*L*
2 series · 2 of 2 positions shown · non-contrast
Comparison: None.

CLINICAL DATA: Rib pain

EXAM:
LEFT RIBS - 2 VIEW

[rib ap]
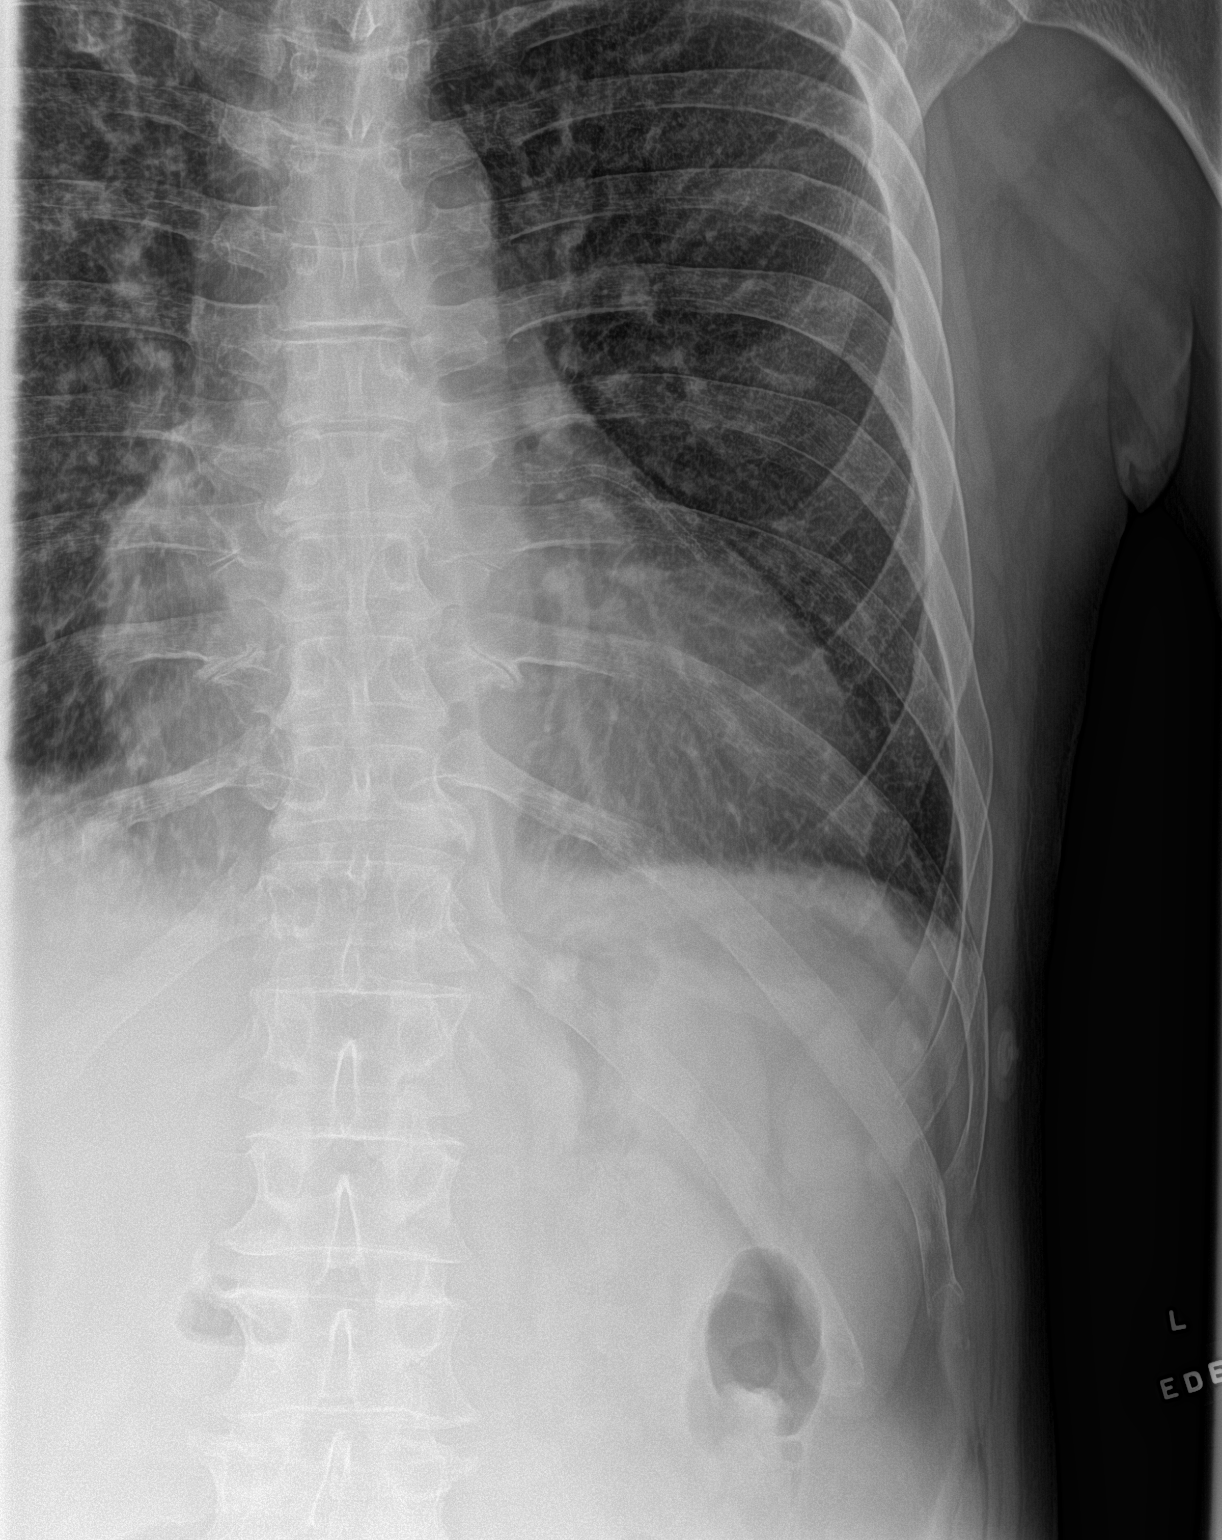

[rib obl]
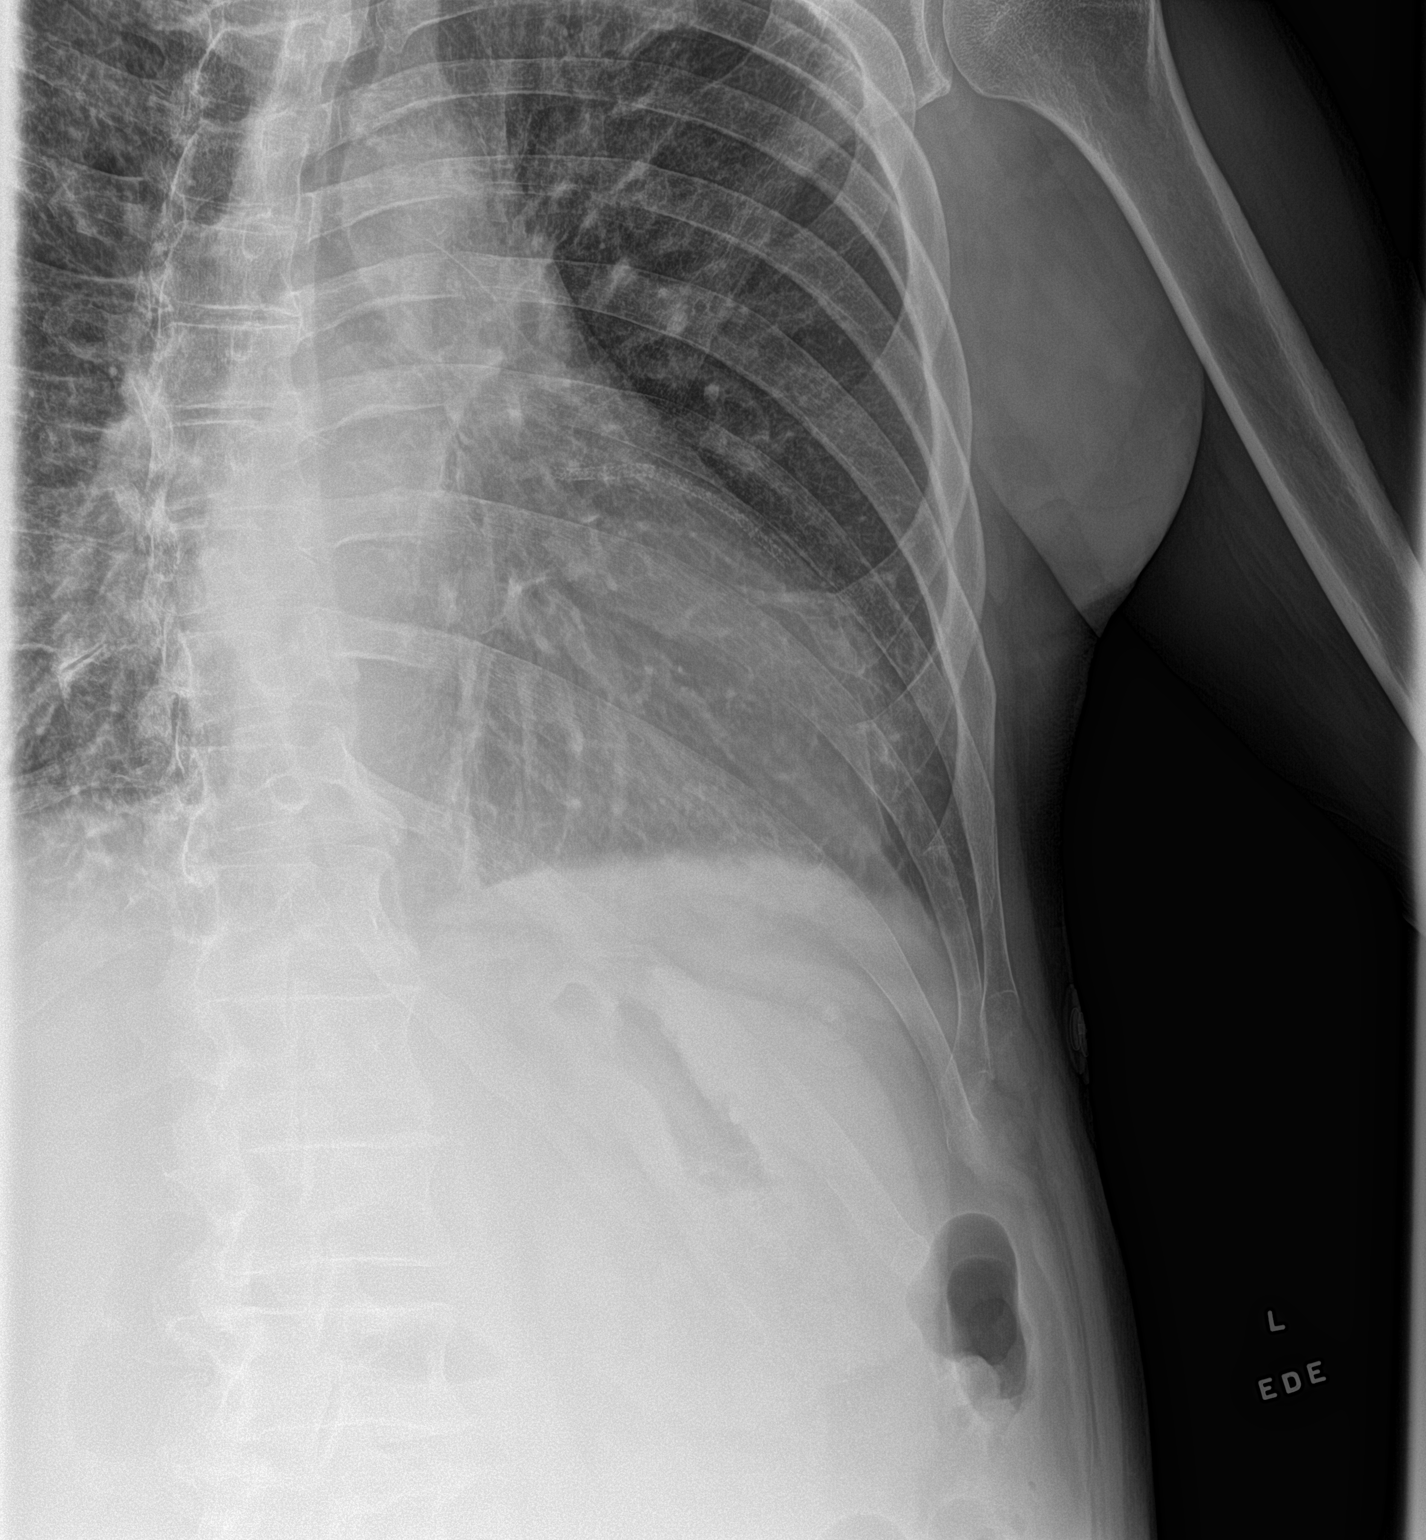

[2 of 2 positions shown; findings below may reference images not displayed]

FINDINGS: No fracture or other bone lesions are seen involving the ribs.
IMPRESSION: Negative.

## 2016-02-20 ENCOUNTER — Other Ambulatory Visit: Payer: Self-pay | Admitting: *Deleted

## 2016-02-20 ENCOUNTER — Other Ambulatory Visit (HOSPITAL_COMMUNITY): Payer: Self-pay | Admitting: *Deleted

## 2016-02-20 DIAGNOSIS — I255 Ischemic cardiomyopathy: Secondary | ICD-10-CM

## 2016-02-20 DIAGNOSIS — I10 Essential (primary) hypertension: Secondary | ICD-10-CM

## 2016-02-20 DIAGNOSIS — I5022 Chronic systolic (congestive) heart failure: Secondary | ICD-10-CM

## 2016-02-20 DIAGNOSIS — I4510 Unspecified right bundle-branch block: Secondary | ICD-10-CM

## 2016-02-20 MED ORDER — SPIRONOLACTONE 25 MG PO TABS: 25.0000 mg | ORAL_TABLET | Freq: Every day | ORAL | Status: DC

## 2016-02-21 IMAGING — CR DG CHEST 1V PORT
1 series · 1 of 1 positions shown · non-contrast
Comparison: Chest radiograph November 16, 2014

CLINICAL DATA: Chest pain, history of cardiomyopathy and
dyslipidemia.

EXAM:
PORTABLE CHEST - 1 VIEW

[AP]
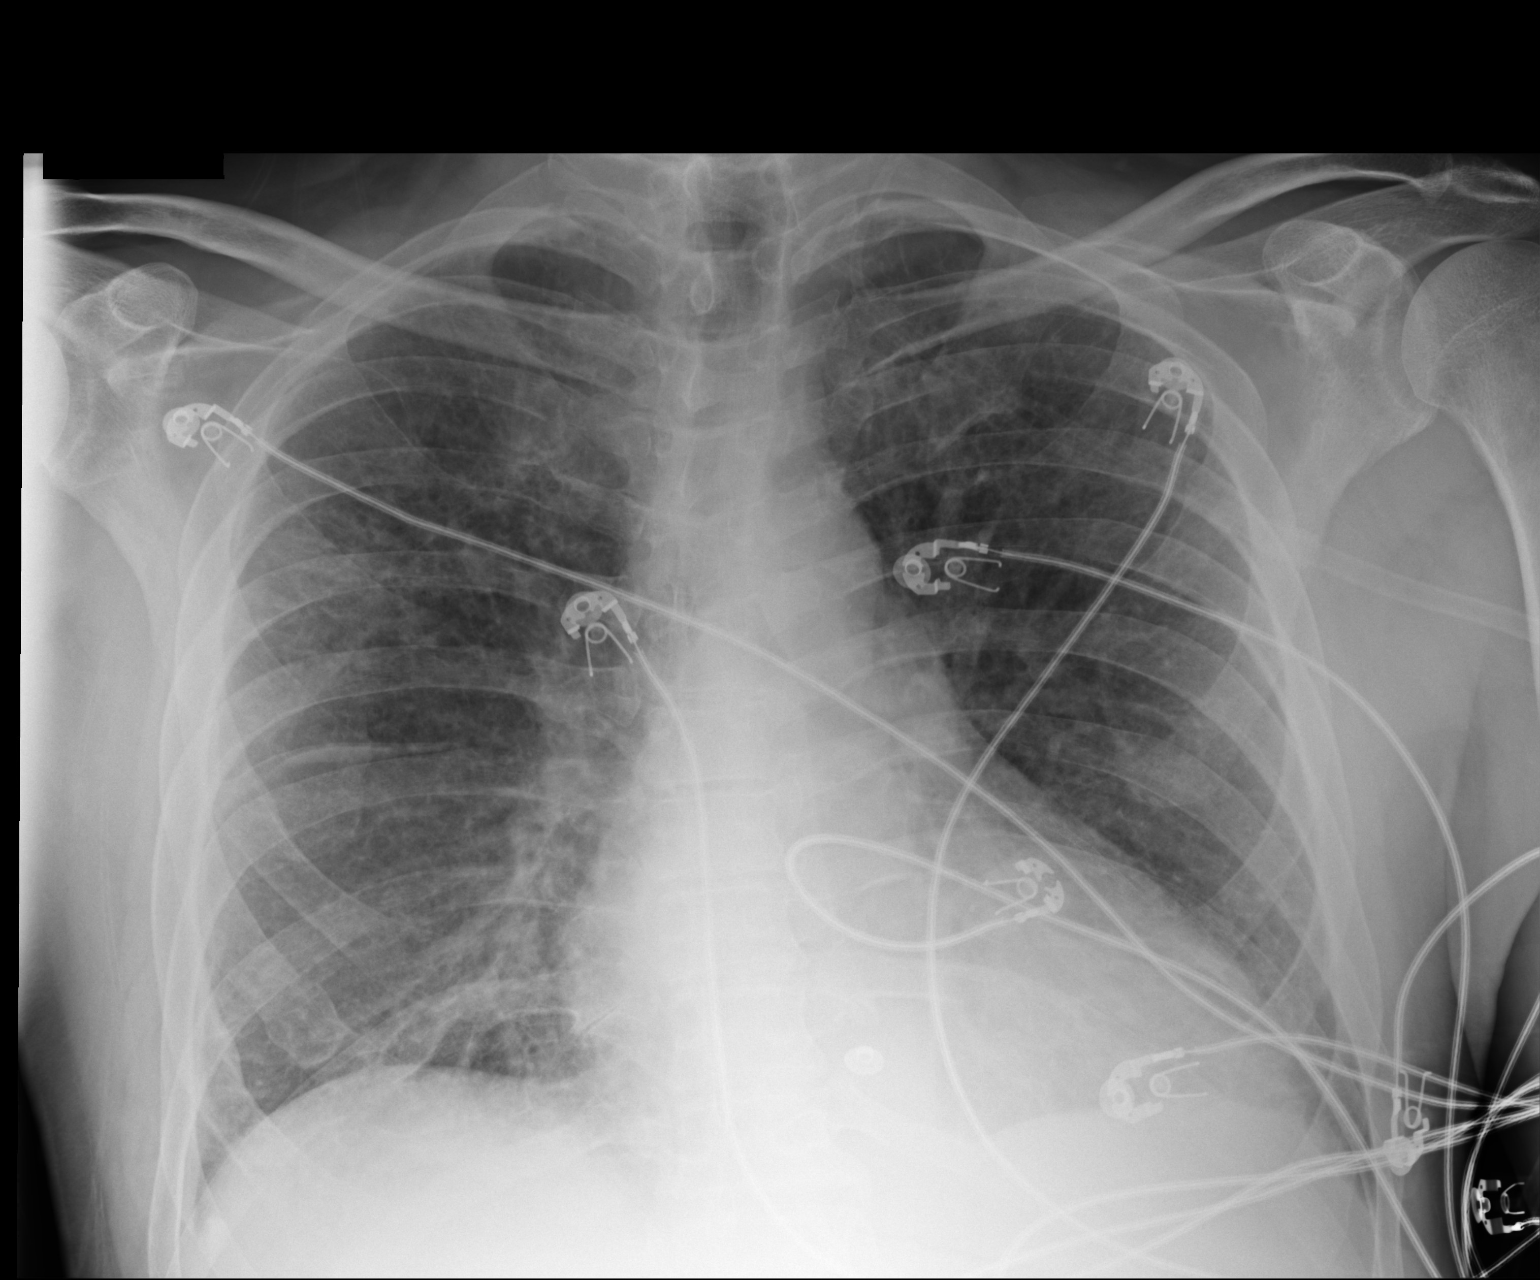

[1 of 1 positions shown; findings below may reference images not displayed]

FINDINGS: Cardiac silhouette remains mildly enlarged. Pulmonary vascular
congestion and similar interstitial prominence without pleural
effusion or focal consolidation. No pneumothorax. Remote RIGHT
anterior rib fractures. Soft tissue planes are nonsuspicious.
IMPRESSION: Similar cardiomegaly, interstitial prominence most consistent with
pulmonary edema without focal consolidation.

  By: Grosu Ala

## 2016-02-25 ENCOUNTER — Ambulatory Visit (INDEPENDENT_AMBULATORY_CARE_PROVIDER_SITE_OTHER): Payer: PPO | Admitting: *Deleted

## 2016-02-25 DIAGNOSIS — I255 Ischemic cardiomyopathy: Secondary | ICD-10-CM | POA: Diagnosis not present

## 2016-02-26 ENCOUNTER — Encounter: Payer: Self-pay | Admitting: Internal Medicine

## 2016-02-26 NOTE — Progress Notes (Signed)
Remote ICD transmission.   

## 2016-03-10 ENCOUNTER — Ambulatory Visit (HOSPITAL_COMMUNITY): Payer: PPO | Attending: Cardiovascular Disease

## 2016-03-10 ENCOUNTER — Other Ambulatory Visit (HOSPITAL_COMMUNITY): Payer: Self-pay | Admitting: *Deleted

## 2016-03-10 ENCOUNTER — Other Ambulatory Visit: Payer: Self-pay

## 2016-03-10 DIAGNOSIS — I4510 Unspecified right bundle-branch block: Secondary | ICD-10-CM | POA: Insufficient documentation

## 2016-03-10 DIAGNOSIS — I110 Hypertensive heart disease with heart failure: Secondary | ICD-10-CM | POA: Diagnosis not present

## 2016-03-10 DIAGNOSIS — G4730 Sleep apnea, unspecified: Secondary | ICD-10-CM | POA: Diagnosis not present

## 2016-03-10 DIAGNOSIS — I5022 Chronic systolic (congestive) heart failure: Secondary | ICD-10-CM

## 2016-03-10 DIAGNOSIS — I252 Old myocardial infarction: Secondary | ICD-10-CM | POA: Diagnosis not present

## 2016-03-10 DIAGNOSIS — I2510 Atherosclerotic heart disease of native coronary artery without angina pectoris: Secondary | ICD-10-CM | POA: Diagnosis not present

## 2016-03-10 DIAGNOSIS — R29898 Other symptoms and signs involving the musculoskeletal system: Secondary | ICD-10-CM | POA: Insufficient documentation

## 2016-03-10 DIAGNOSIS — I509 Heart failure, unspecified: Secondary | ICD-10-CM | POA: Diagnosis not present

## 2016-03-10 MED ORDER — PERFLUTREN LIPID MICROSPHERE
1.0000 mL | INTRAVENOUS | Status: AC | PRN
  Administered 2016-03-10: 2 mL via INTRAVENOUS

## 2016-03-28 LAB — CUP PACEART REMOTE DEVICE CHECK
Battery Voltage: 3.14 V
Date Time Interrogation Session: 20170320060016
HIGH POWER IMPEDANCE MEASURED VALUE: 64 Ohm
HighPow Impedance: 64 Ohm
Implantable Lead Implant Date: 20160323
Implantable Lead Location: 753860
Lead Channel Setting Sensing Sensitivity: 0.5 mV
MDC IDC MSMT BATTERY REMAINING LONGEVITY: 98 mo
MDC IDC MSMT BATTERY REMAINING PERCENTAGE: 91 %
MDC IDC MSMT LEADCHNL RV IMPEDANCE VALUE: 360 Ohm
MDC IDC MSMT LEADCHNL RV SENSING INTR AMPL: 8.2 mV
MDC IDC SET LEADCHNL RV PACING AMPLITUDE: 2.5 V
MDC IDC SET LEADCHNL RV PACING PULSEWIDTH: 0.5 ms
MDC IDC STAT BRADY RV PERCENT PACED: 1 %
Pulse Gen Serial Number: 7255600

## 2016-03-31 ENCOUNTER — Encounter: Payer: Self-pay | Admitting: Cardiology

## 2016-04-11 ENCOUNTER — Other Ambulatory Visit (HOSPITAL_COMMUNITY): Payer: Self-pay | Admitting: Adult Health

## 2016-05-24 ENCOUNTER — Other Ambulatory Visit (HOSPITAL_COMMUNITY): Payer: Self-pay | Admitting: Internal Medicine

## 2016-05-26 ENCOUNTER — Ambulatory Visit (INDEPENDENT_AMBULATORY_CARE_PROVIDER_SITE_OTHER): Payer: PPO | Admitting: *Deleted

## 2016-05-26 DIAGNOSIS — I255 Ischemic cardiomyopathy: Secondary | ICD-10-CM

## 2016-05-26 DIAGNOSIS — I4901 Ventricular fibrillation: Secondary | ICD-10-CM

## 2016-05-26 NOTE — Progress Notes (Signed)
Remote ICD transmission.   

## 2016-05-27 LAB — CUP PACEART REMOTE DEVICE CHECK
Battery Remaining Longevity: 97 mo
Brady Statistic RV Percent Paced: 1 %
Date Time Interrogation Session: 20170619060016
HIGH POWER IMPEDANCE MEASURED VALUE: 68 Ohm
HighPow Impedance: 68 Ohm
Implantable Lead Implant Date: 20160323
Lead Channel Impedance Value: 380 Ohm
Lead Channel Sensing Intrinsic Amplitude: 12 mV
MDC IDC LEAD LOCATION: 753860
MDC IDC MSMT BATTERY REMAINING PERCENTAGE: 89 %
MDC IDC MSMT BATTERY VOLTAGE: 3.11 V
MDC IDC SET LEADCHNL RV PACING AMPLITUDE: 2.5 V
MDC IDC SET LEADCHNL RV PACING PULSEWIDTH: 0.5 ms
MDC IDC SET LEADCHNL RV SENSING SENSITIVITY: 0.5 mV
Pulse Gen Serial Number: 7255600

## 2016-05-30 ENCOUNTER — Encounter: Payer: Self-pay | Admitting: Cardiology

## 2016-06-13 ENCOUNTER — Encounter: Payer: Self-pay | Admitting: Cardiology

## 2016-06-18 DIAGNOSIS — M65331 Trigger finger, right middle finger: Secondary | ICD-10-CM | POA: Diagnosis not present

## 2016-06-22 ENCOUNTER — Other Ambulatory Visit (HOSPITAL_COMMUNITY): Payer: Self-pay | Admitting: Internal Medicine

## 2016-07-22 ENCOUNTER — Other Ambulatory Visit (HOSPITAL_COMMUNITY): Payer: Self-pay | Admitting: Adult Health

## 2016-07-23 ENCOUNTER — Other Ambulatory Visit (HOSPITAL_COMMUNITY): Payer: Self-pay | Admitting: Internal Medicine

## 2016-07-23 ENCOUNTER — Other Ambulatory Visit: Payer: Self-pay | Admitting: Internal Medicine

## 2016-07-28 ENCOUNTER — Ambulatory Visit (HOSPITAL_COMMUNITY)
Admission: RE | Admit: 2016-07-28 | Discharge: 2016-07-28 | Disposition: A | Discharge status: Home / Self Care | Payer: PPO | Source: Ambulatory Visit | Attending: Internal Medicine | Admitting: Internal Medicine

## 2016-07-28 VITALS — BP 108/64 | HR 63 | Wt 198.4 lb

## 2016-07-28 DIAGNOSIS — I5022 Chronic systolic (congestive) heart failure: Secondary | ICD-10-CM | POA: Insufficient documentation

## 2016-07-28 DIAGNOSIS — Z87891 Personal history of nicotine dependence: Secondary | ICD-10-CM | POA: Diagnosis not present

## 2016-07-28 DIAGNOSIS — E785 Hyperlipidemia, unspecified: Secondary | ICD-10-CM | POA: Insufficient documentation

## 2016-07-28 DIAGNOSIS — Z825 Family history of asthma and other chronic lower respiratory diseases: Secondary | ICD-10-CM | POA: Insufficient documentation

## 2016-07-28 DIAGNOSIS — J449 Chronic obstructive pulmonary disease, unspecified: Secondary | ICD-10-CM | POA: Diagnosis not present

## 2016-07-28 DIAGNOSIS — Z8249 Family history of ischemic heart disease and other diseases of the circulatory system: Secondary | ICD-10-CM | POA: Diagnosis not present

## 2016-07-28 DIAGNOSIS — Z7982 Long term (current) use of aspirin: Secondary | ICD-10-CM | POA: Insufficient documentation

## 2016-07-28 DIAGNOSIS — I255 Ischemic cardiomyopathy: Secondary | ICD-10-CM | POA: Insufficient documentation

## 2016-07-28 DIAGNOSIS — Z9861 Coronary angioplasty status: Secondary | ICD-10-CM

## 2016-07-28 DIAGNOSIS — Z888 Allergy status to other drugs, medicaments and biological substances status: Secondary | ICD-10-CM | POA: Insufficient documentation

## 2016-07-28 DIAGNOSIS — Z79899 Other long term (current) drug therapy: Secondary | ICD-10-CM | POA: Diagnosis not present

## 2016-07-28 DIAGNOSIS — Z7902 Long term (current) use of antithrombotics/antiplatelets: Secondary | ICD-10-CM | POA: Diagnosis not present

## 2016-07-28 DIAGNOSIS — I2510 Atherosclerotic heart disease of native coronary artery without angina pectoris: Secondary | ICD-10-CM | POA: Diagnosis not present

## 2016-07-28 DIAGNOSIS — I252 Old myocardial infarction: Secondary | ICD-10-CM | POA: Insufficient documentation

## 2016-07-28 DIAGNOSIS — I110 Hypertensive heart disease with heart failure: Secondary | ICD-10-CM | POA: Insufficient documentation

## 2016-07-28 DIAGNOSIS — Z955 Presence of coronary angioplasty implant and graft: Secondary | ICD-10-CM | POA: Insufficient documentation

## 2016-07-28 DIAGNOSIS — Z95810 Presence of automatic (implantable) cardiac defibrillator: Secondary | ICD-10-CM | POA: Insufficient documentation

## 2016-07-28 MED ORDER — FUROSEMIDE 40 MG PO TABS: 40.0000 mg | ORAL_TABLET | ORAL | 6 refills | Status: DC

## 2016-07-28 MED ORDER — SACUBITRIL-VALSARTAN 24-26 MG PO TABS: 1.0000 | ORAL_TABLET | Freq: Two times a day (BID) | ORAL | 6 refills | Status: DC

## 2016-07-28 NOTE — Progress Notes (Signed)
Patient ID: [name], male   DOB: 12-Jan-1951, 65 y.o.   MRN: [id number]    Advanced HF Clinic Note   History of Present Illness: Kyle Bullock is a 65 y.o. male who is referred to the HF Clinic by Dr. Excell Bullock. He has h/o CAD and ischemic cardiomyopathy 20-25%.  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    In December 2015, he suffered an acute anterolateral MI complicated by VF arrest in [place]. He underwent drug-eluting stent placement to the LAD and PTCA of the diagonal. EF was 25% and a life vest was placed prior to discharge. Following return to [place], he developed dyspnea and presented to [facility] where he was readmitted. His Brilinta was switched to Effient as it was felt the Brilinta may have been playing a role in his dyspnea. He was also switched from an ACE inhibitor to an ARB. He was seen by electrophysiology with recommendation for ongoing life vest therapy and follow-up echo in 90 days post revascularization. He was then seen in clinic in mid December where he reported pleuritic chest pain. He was readmitted and underwent diagnostic catheterization revealing patency of previously placed stent and area of balloon angioplasty in the diagonal. EF was 20-25% with diffuse hypokinesis and normal pericardial appearance. He did have some hypotension requiring discontinuation of ARB therapy. His beta blocker was also down titrated. He was diagnosed with Dressler's syndrome and was placed on prednisone taper and colchicine.  We last saw him in 6/16 was doing well. CPX and CT with COPD. Referred to Dr. Kendrick Fries who felt he mad mild COPD but no ILD. Given PRN albuterol. Has repeat CT scheduled for 1 year.   He returns for heart failure follow up. Doing well. Working [employer].  Walking at least 2.5 miles daily in 50 minutes. Denies SOB/PND/Orthopnea. However was working in the yard this weekend and had to stop frequently for breaks Weight at home 195-200 pounds  Taking all  medications. Following low salt and low fat diet. Remains nicotine free. BP drops when he stands up after long drive.   Echo 3/16 EF 25-30% Echo 03/2016 EF 20-25%  CPX 05/14/2015  Peak VO2: 19.0 (64.5% predicted peak VO2) VE/VCO2 slope: 41.5 OUES: 2.01 Peak RER: 1.06 Ventilatory Threshold: 14.1     (47.9% predicted or measured peak VO2)   Labs  04/10/2014: K 5.2 Creatinine 1.13    Past Medical History:  Diagnosis Date  . Abnormal TSH    a. 11/2014: felt d/t sick euthyroid.  Marland Kitchen AICD (automatic cardioverter/defibrillator) present 02/28/2015  . CAD (coronary artery disease)    a. 11/2014: arrest/STEMI s/p Xience DES to the LAD and PTCA to the D1 [place], [place]). b. Relook cath 11/2014: no culprit, patent stent.  . CHF (congestive heart failure) (HCC)   . Dressler's syndrome (HCC)    a. 11/2014: tx with colchicine and prednisone.  . Dyslipidemia   . Dyspnea    a. 11/2014: possibly due to combo of CHF and Brilinta. Brilinta changed to Effient.  . Erectile dysfunction    a. Pt aware not to take ED med within 24 hr of NTG and vice versa.  . Hypertension   . Ischemic cardiomyopathy    a. 11/2014: EF reportedly 20% by cath and echo in [place].  Marland Kitchen NSVT (nonsustained ventricular tachycardia) (HCC) 11/16/14   a. 11/2014: admitted after discharge from STEMI admission, 18 beats 135-140bpm in ED.  Marland Kitchen Pericardial effusion    a. 11/2014.  Marland Kitchen  Pericarditis dx'd 11/2014  . Pneumonia 1990's X 2  . RBBB   . Sleep apnea    suspected but not diagnosed (02/28/2015)  . STEMI (ST elevation myocardial infarction) (HCC) 11/10/2014  . Tobacco abuse   . VF (ventricular fibrillation) (HCC) 11/10/14   a. 11/2014: arrest with STEMI.    Past Surgical History:  Procedure Laterality Date  . CARDIAC CATHETERIZATION  11/19/2014  . CARDIAC DEFIBRILLATOR PLACEMENT  02/28/2015  . CORONARY ANGIOPLASTY WITH STENT PLACEMENT  11/10/2014   LAD DES  . HAND SURGERY Bilateral    Dupuytren's Contracture  .  IMPLANTABLE CARDIOVERTER DEFIBRILLATOR IMPLANT N/A 02/28/2015   SJM Fortify Assura VR ICD implanted by Dr Johney FrameAllred.  Revascularized following VF arrest, EF remained depressed and ICD implanted  . KNEE ARTHROSCOPY Right 1982  . LEFT HEART CATH N/A 11/19/2014   Procedure: LEFT HEART CATH;  Surgeon: Runell GessJonathan J Berry, MD;  [facility];  Service: Cardiovascular;  Laterality: N/A;    Current Outpatient Prescriptions  Medication Sig Dispense Refill  . aspirin EC 81 MG tablet Take 81 mg by mouth daily.    Marland Kitchen. atorvastatin (LIPITOR) 20 MG tablet TAKE 1 TABLET(20 MG) BY MOUTH DAILY 30 tablet 3  . carvedilol (COREG) 12.5 MG tablet TAKE 1 TABLET(12.5 MG) BY MOUTH TWICE DAILY WITH A MEAL 180 tablet 0  . clopidogrel (PLAVIX) 75 MG tablet TAKE 1 TABLET(75 MG) BY MOUTH DAILY 90 tablet 0  . furosemide (LASIX) 40 MG tablet Take 40 mg by mouth daily. With an additional tab in the PM as needed for swelling    . losartan (COZAAR) 25 MG tablet TAKE 1 TABLET(25 MG) BY MOUTH AT BEDTIME 90 tablet 3  . nitroGLYCERIN (NITROSTAT) 0.4 MG SL tablet Place 0.4 mg under the tongue every 5 (five) minutes as needed for chest pain. Reported on 12/13/2015    . spironolactone (ALDACTONE) 25 MG tablet Take 1 tablet (25 mg total) by mouth daily. 90 tablet 3  . cefdinir (OMNICEF) 300 MG capsule Take 300 mg by mouth 2 (two) times daily.     No current facility-administered medications for this encounter.     Allergies:   Ambien [zolpidem tartrate]; Lisinopril; and Brilinta [ticagrelor]   Social History:  The patient  reports that he quit smoking about 20 months ago. His smoking use included Cigarettes. He has a 80.00 pack-year smoking history. He has never used smokeless tobacco. He reports that he drinks about 8.4 oz of alcohol per week . He reports that he does not use drugs.   Family History:  The patient's  family history includes COPD in his mother; Hypertension in his father and mother.    ROS:  Please see the history  of present illness.  Otherwise, review of systems is positive for shortness of breath.  All other systems are reviewed and negative.    PHYSICAL EXAM: VS:  BP 108/64   Pulse 63   Wt 198 lb 6.4 oz (90 kg)   SpO2 96%   BMI 24.80 kg/m  , BMI There is no height or weight on file to calculate BMI.  GEN: Well nourished, well developed, in no acute distress  HEENT: normal  Neck: no JVD, no masses, no carotid bruits Cardiac: RRR without murmur or gallop. ICD site looks good.             Respiratory:  clear to auscultation bilaterally, normal work of breathing GI: soft, nontender, nondistended, + BS MS: no deformity or atrophy  Ext:  no pretibial edema Skin: warm and dry, no rash Neuro:  Strength and sensation are intact Psych: euthymic mood, full affect   Recent Labs: No results found for requested labs within last 8760 hours.   Lipid Panel     Component Value Date/Time   CHOL 108 01/08/2015 0850   CHOL 101 12/04/2014 1030   TRIG 155.0 (H) 01/08/2015 0850   HDL 39.00 (L) 01/08/2015 0850   HDL 43 12/04/2014 1030   CHOLHDL 3 01/08/2015 0850   VLDL 31.0 01/08/2015 0850   LDLCALC 38 01/08/2015 0850   LDLCALC 39 12/04/2014 1030      Wt Readings from Last 3 Encounters:  12/13/15 199 lb 8 oz (90.5 kg)  08/27/15 196 lb (88.9 kg)  07/05/15 196 lb (88.9 kg)     Cardiac Studies Reviewed: 2D Echo 11/13/2014: Study Conclusions  - Left ventricle: The cavity size was normal. Wall thickness was normal. Systolic function was severely reduced. The estimated ejection fraction was in the range of 20% to 25%. Diastolic dysfunction with elevated LV Filling pressure. Diffuse hypokinesis. - Mitral valve: Mildly thickened leaflets . There was mild regurgitation. - Left atrium: The atrium was at the upper limits of normal in size. - Right atrium: The atrium was mildly dilated. - Pericardium, extracardiac: Small circumferential pericardial effusion. Tamponade physiology is not  suspect, but cannot be ruled-out on the basis of this study.  Impressions:  - Compared to the prior echo in 11/2014, there is now a small circumferential pericardial effusion - no clear tamponade physiology, however, the IVC is dilated. There has been no improvement in LVEF.  ASSESSMENT AND PLAN:  1. Chronic systolic due to ischemic CM - HF EF 25-30% echo 3/16.     --Currently NYHA I-II at baseline but this past weekend II-III Volume status looks good. Continue prn lasix.   - Switch to Entresto 24/26. Cut lasix to 40 qod. Take extra lasix as needed.  - Continue carvedilol 12.5 mg twice a day . Will not increase with heart rate 61 and soft BP.  - Continue sprio 25 mg daily.   - STJ ICD interrogated personally. Corevue ok. No VT. Could not find PVC counter but last check said < 10% - Given increased symptoms this weekend will send for CPX in 4 weeks.  2.  CAD s/p Anterior MI December 2015:  --No recurrent angina though with some exertional fatigue this weekend.Sending for CPX. He will continue on ASA, statin and Effient per Dr. Excell Seltzerooper Lipids per Dr. Eric Formoug Shaw. Goal LDL < 70 3. Tobacco abuse:  --complete smoking cessation (quit at time of MI). 5. Emphysema - follows with [facility]   Kyle Mah,MD 3:48 PM

## 2016-07-28 NOTE — Patient Instructions (Signed)
STOP Losartan.  START Entresto 24/26 mg: Take 1 tablet twice daily (10-12 hrs apart).  CHANGE Lasix to 40 mg every other day.  Follow up 4 weeks with Dr. Gala RomneyBensimhon.  Do the following things EVERYDAY: 1) Weigh yourself in the morning before breakfast. Write it down and keep it in a log. 2) Take your medicines as prescribed 3) Eat low salt foods-Limit salt (sodium) to 2000 mg per day.  4) Stay as active as you can everyday 5) Limit all fluids for the day to less than 2 liters

## 2016-08-07 ENCOUNTER — Encounter: Payer: Self-pay | Admitting: Internal Medicine

## 2016-08-07 ENCOUNTER — Telehealth (HOSPITAL_COMMUNITY): Payer: Self-pay | Admitting: Pharmacist

## 2016-08-07 NOTE — Telephone Encounter (Signed)
Entresto 24-26 mg BID PA approved by [employer] Part D through 12/07/16.   Tyler DeisErika K. Bonnye FavaNicolsen, PharmD, BCPS, CPP Clinical Pharmacist Pager: [phone] Phone: [phone] 08/07/2016 12:08 PM

## 2016-08-25 ENCOUNTER — Ambulatory Visit (INDEPENDENT_AMBULATORY_CARE_PROVIDER_SITE_OTHER): Payer: PPO | Admitting: Internal Medicine

## 2016-08-25 ENCOUNTER — Encounter: Payer: Self-pay | Admitting: Internal Medicine

## 2016-08-25 VITALS — BP 110/68 | HR 47 | Ht 75.0 in | Wt 201.2 lb

## 2016-08-25 DIAGNOSIS — I5022 Chronic systolic (congestive) heart failure: Secondary | ICD-10-CM | POA: Diagnosis not present

## 2016-08-25 DIAGNOSIS — I4901 Ventricular fibrillation: Secondary | ICD-10-CM | POA: Diagnosis not present

## 2016-08-25 LAB — CUP PACEART INCLINIC DEVICE CHECK
Implantable Lead Location: 753860
MDC IDC LEAD IMPLANT DT: 20160323
MDC IDC PG SERIAL: 7255600
MDC IDC SESS DTM: 20170918124426

## 2016-08-25 NOTE — Progress Notes (Signed)
Electrophysiology Office Note   Date:  08/25/2016   ID:  Kyle Bullock, DOB 1951/01/20, MRN [id number]  PCP:  Martha Clan, MD  Cardiologist:  Dr Excell Seltzer CHF: Dr Gala Romney Primary Electrophysiologist: Hillis Range, MD    Chief Complaint  Patient presents with  . Follow-up    ICM     History of Present Illness: Kyle Bullock is a 65 y.o. male who presents today for electrophysiology evaluation.   Doing well s/p ICD implantation.  Very active and traveling with work.  Overall is doing well.  Some SOB with moderate activity.  Today, he denies symptoms of palpitations, chest pain,  PND, lower extremity edema, claudication, dizziness, presyncope, syncope, bleeding, or neurologic sequela. The patient istolerating medications without difficulties and is otherwise without complaint today.    Past Medical History:  Diagnosis Date  . Abnormal TSH    a. 11/2014: felt d/t sick euthyroid.  Marland Kitchen AICD (automatic cardioverter/defibrillator) present 02/28/2015  . CAD (coronary artery disease)    a. 11/2014: arrest/STEMI s/p Xience DES to the LAD and PTCA to the D1 [place], [place]). b. Relook cath 11/2014: no culprit, patent stent.  . CHF (congestive heart failure) (HCC)   . Dressler's syndrome (HCC)    a. 11/2014: tx with colchicine and prednisone.  . Dyslipidemia   . Dyspnea    a. 11/2014: possibly due to combo of CHF and Brilinta. Brilinta changed to Effient.  . Erectile dysfunction    a. Pt aware not to take ED med within 24 hr of NTG and vice versa.  . Hypertension   . Ischemic cardiomyopathy    a. 11/2014: EF reportedly 20% by cath and echo in [place].  Marland Kitchen NSVT (nonsustained ventricular tachycardia) (HCC) 11/16/14   a. 11/2014: admitted after discharge from STEMI admission, 18 beats 135-140bpm in ED.  Marland Kitchen Pericardial effusion    a. 11/2014.  Marland Kitchen Pericarditis dx'd 11/2014  . Pneumonia 1990's X 2  . RBBB   . Sleep apnea    suspected but not diagnosed (02/28/2015)  . STEMI (ST  elevation myocardial infarction) (HCC) 11/10/2014  . Tobacco abuse   . VF (ventricular fibrillation) (HCC) 11/10/14   a. 11/2014: arrest with STEMI.   Past Surgical History:  Procedure Laterality Date  . CARDIAC CATHETERIZATION  11/19/2014  . CARDIAC DEFIBRILLATOR PLACEMENT  02/28/2015  . CORONARY ANGIOPLASTY WITH STENT PLACEMENT  11/10/2014   LAD DES  . HAND SURGERY Bilateral    Dupuytren's Contracture  . IMPLANTABLE CARDIOVERTER DEFIBRILLATOR IMPLANT N/A 02/28/2015   SJM Fortify Assura VR ICD implanted by Dr Johney Frame.  Revascularized following VF arrest, EF remained depressed and ICD implanted  . KNEE ARTHROSCOPY Right 1982  . LEFT HEART CATH N/A 11/19/2014   Procedure: LEFT HEART CATH;  Surgeon: Runell Gess, MD;  Location: [facility] CATH LAB;  Service: Cardiovascular;  Laterality: N/A;     Current Outpatient Prescriptions  Medication Sig Dispense Refill  . aspirin EC 81 MG tablet Take 81 mg by mouth daily.    Marland Kitchen atorvastatin (LIPITOR) 20 MG tablet TAKE 1 TABLET(20 MG) BY MOUTH DAILY 30 tablet 3  . carvedilol (COREG) 12.5 MG tablet TAKE 1 TABLET(12.5 MG) BY MOUTH TWICE DAILY WITH A MEAL 180 tablet 0  . clopidogrel (PLAVIX) 75 MG tablet TAKE 1 TABLET(75 MG) BY MOUTH DAILY 90 tablet 0  . furosemide (LASIX) 40 MG tablet Take 1 tablet (40 mg total) by mouth every other day. With an additional tab in the PM as needed for swelling  30 tablet 6  . nitroGLYCERIN (NITROSTAT) 0.4 MG SL tablet Place 0.4 mg under the tongue every 5 (five) minutes as needed for chest pain. Reported on 12/13/2015    . sacubitril-valsartan (ENTRESTO) 24-26 MG Take 1 tablet by mouth 2 (two) times daily. 60 tablet 6  . spironolactone (ALDACTONE) 25 MG tablet Take 1 tablet (25 mg total) by mouth daily. 90 tablet 3   No current facility-administered medications for this visit.     Allergies:   Ambien [zolpidem tartrate]; Lisinopril; and Brilinta [ticagrelor]   Social History:  The patient  reports that he quit smoking about  21 months ago. His smoking use included Cigarettes. He has a 80.00 pack-year smoking history. He has never used smokeless tobacco. He reports that he drinks about 8.4 oz of alcohol per week . He reports that he does not use drugs.   Family History:  The patient's family history includes COPD in his mother; Hypertension in his father and mother.    ROS:  Please see the history of present illness.   All other systems are reviewed and negative.    PHYSICAL EXAM: VS:  BP 110/68   Pulse (!) 47   Ht 6\' 3"  (1.905 m)   Wt 201 lb 3.2 oz (91.3 kg)   BMI 25.15 kg/m  , BMI Body mass index is 25.15 kg/m. GEN: Well nourished, well developed, in no acute distress  HEENT: normal  Neck: no JVD, carotid bruits, or masses Cardiac: RRR; no murmurs, rubs, or gallops,no edema  Respiratory:  clear to auscultation bilaterally, normal work of breathing GI: soft, nontender, nondistended, + BS MS: no deformity or atrophy  Skin: warm and dry, device pocket is well healed Neuro:  Strength and sensation are intact Psych: euthymic mood, full affect  Device interrogation is reviewed today in detail.  See PaceArt for details. ekg today reveals sinus bradycardia 47 bpm, PR 180 msec, QRS 128 msec with incomplete RBBB, + anterior infarct pattern  Lipid Panel     Component Value Date/Time   CHOL 108 01/08/2015 0850   CHOL 101 12/04/2014 1030   TRIG 155.0 (H) 01/08/2015 0850   HDL 39.00 (L) 01/08/2015 0850   HDL 43 12/04/2014 1030   CHOLHDL 3 01/08/2015 0850   VLDL 31.0 01/08/2015 0850   LDLCALC 38 01/08/2015 0850   LDLCALC 39 12/04/2014 1030     Wt Readings from Last 3 Encounters:  08/25/16 201 lb 3.2 oz (91.3 kg)  07/28/16 198 lb 6.4 oz (90 kg)  12/13/15 199 lb 8 oz (90.5 kg)     ASSESSMENT AND PLAN:  1.  Ischemic CM/ chronic systolic dysfunction/ CAD euvolemic today, no ischemic symptoms Normal ICD function See Pace Art report No changes today [facility]  2. Sinus bradycardia Asymptomatic Histograms reveal heart rates frequently 60s-80s  V paced <1%  Follow-up: merlin,  Return to see me in 1 year  Current medicines are reviewed at length with the patient today.   The patient does not have concerns regarding his medicines.  The following changes were made today:  none   Signed, Hillis RangeJames Lambert Jeanty, MD  08/25/2016 12:20 PM     [facility] [address] [place] [address] [phone] (office) [phone] (fax)

## 2016-08-25 NOTE — Patient Instructions (Signed)
Medication Instructions:  Your physician recommends that you continue on your current medications as directed. Please refer to the Current Medication list given to you today.   Labwork: None ordered   Testing/Procedures: None ordered   Follow-Up: Your physician wants you to follow-up in: 12 months with Dr Johney FrameAllred Bonita QuinYou will receive a reminder letter in the mail two months in advance. If you don't receive a letter, please call our office to schedule the follow-up appointment.  Remote monitoring is used to monitor your  ICD from home. This monitoring reduces the number of office visits required to check your device to one time per year. It allows us to keep an eye on the functioning of your device to ensure it is working properly. You are scheduled for a device check from home on 11/24/16 You may send your transmission at any time that day. If you have a wireless device, the transmission will be sent automatically. After your physician reviews your transmission, you will receive a postcard with your next transmission date.

## 2016-09-02 ENCOUNTER — Ambulatory Visit (HOSPITAL_COMMUNITY)
Admission: RE | Admit: 2016-09-02 | Discharge: 2016-09-02 | Disposition: A | Discharge status: Home / Self Care | Payer: PPO | Source: Ambulatory Visit | Attending: Internal Medicine | Admitting: Internal Medicine

## 2016-09-02 VITALS — BP 110/68 | HR 57 | Wt 202.5 lb

## 2016-09-02 DIAGNOSIS — J439 Emphysema, unspecified: Secondary | ICD-10-CM | POA: Diagnosis not present

## 2016-09-02 DIAGNOSIS — I110 Hypertensive heart disease with heart failure: Secondary | ICD-10-CM | POA: Diagnosis not present

## 2016-09-02 DIAGNOSIS — I2510 Atherosclerotic heart disease of native coronary artery without angina pectoris: Secondary | ICD-10-CM | POA: Diagnosis not present

## 2016-09-02 DIAGNOSIS — I255 Ischemic cardiomyopathy: Secondary | ICD-10-CM | POA: Diagnosis not present

## 2016-09-02 DIAGNOSIS — J449 Chronic obstructive pulmonary disease, unspecified: Secondary | ICD-10-CM | POA: Diagnosis not present

## 2016-09-02 DIAGNOSIS — Z955 Presence of coronary angioplasty implant and graft: Secondary | ICD-10-CM | POA: Diagnosis not present

## 2016-09-02 DIAGNOSIS — Z7982 Long term (current) use of aspirin: Secondary | ICD-10-CM | POA: Diagnosis not present

## 2016-09-02 DIAGNOSIS — I313 Pericardial effusion (noninflammatory): Secondary | ICD-10-CM | POA: Insufficient documentation

## 2016-09-02 DIAGNOSIS — Z95810 Presence of automatic (implantable) cardiac defibrillator: Secondary | ICD-10-CM | POA: Diagnosis not present

## 2016-09-02 DIAGNOSIS — I5022 Chronic systolic (congestive) heart failure: Secondary | ICD-10-CM | POA: Diagnosis not present

## 2016-09-02 DIAGNOSIS — I4510 Unspecified right bundle-branch block: Secondary | ICD-10-CM | POA: Insufficient documentation

## 2016-09-02 DIAGNOSIS — E785 Hyperlipidemia, unspecified: Secondary | ICD-10-CM | POA: Diagnosis not present

## 2016-09-02 DIAGNOSIS — Z87891 Personal history of nicotine dependence: Secondary | ICD-10-CM | POA: Insufficient documentation

## 2016-09-02 DIAGNOSIS — Z9861 Coronary angioplasty status: Secondary | ICD-10-CM

## 2016-09-02 DIAGNOSIS — I252 Old myocardial infarction: Secondary | ICD-10-CM | POA: Diagnosis not present

## 2016-09-02 DIAGNOSIS — G4730 Sleep apnea, unspecified: Secondary | ICD-10-CM | POA: Diagnosis not present

## 2016-09-02 NOTE — Patient Instructions (Signed)
Your physician has requested that you regularly monitor and record your blood pressure readings at home. Please use the same machine at the same time of day to check your readings and record them to bring to your follow-up visit.  Your physician recommends that you schedule a follow-up appointment in: 2 months  

## 2016-09-02 NOTE — Progress Notes (Signed)
Patient ID: Kyle Bullock, male   DOB: January 27, 1951, 65 y.o.   MRN: [id number]    Advanced HF Clinic Note   History of Present Illness: Kyle Bullock is a 65 y.o. male who is referred to the HF Clinic by Dr. Excell Seltzerooper. He has h/o CAD and ischemic cardiomyopathy 20-25%.  He is S/P Biomedical engineert Jude Single Chamber ICD on 02/28/2015.    In December 2015, he suffered an acute anterolateral MI complicated by VF arrest in [place]. He underwent drug-eluting stent placement to the LAD and PTCA of the diagonal. EF was 25% and a life vest was placed prior to discharge. Following return to [place], he developed dyspnea and presented [facility] where he was readmitted. His Brilinta was switched to Effient as it was felt the Brilinta may have been playing a role in his dyspnea. He was also switched from an ACE inhibitor to an ARB. He was seen by electrophysiology with recommendation for ongoing life vest therapy and follow-up echo in 90 days post revascularization. He was then seen in clinic in mid December where he reported pleuritic chest pain. He was readmitted and underwent diagnostic catheterization revealing patency of previously placed stent and area of balloon angioplasty in the diagonal. EF was 20-25% with diffuse hypokinesis and normal pericardial appearance. He did have some hypotension requiring discontinuation of ARB therapy. His beta blocker was also down titrated. He was diagnosed with Dressler's syndrome and was placed on prednisone taper and colchicine.  We last saw him in 6/16 was doing well. CPX and CT with COPD. Referred to Dr. Kendrick FriesMcQuaid who felt he mad mild COPD but no ILD. Given PRN albuterol. Has repeat CT scheduled for 1 year.   He returns for heart failure follow up. [facility]. Doing well. Working [employer].  Walking at least 2.5 miles daily in 50 minutes. Denies SOB/PND/Orthopnea. Weight at home stable195-200 pounds  Taking all medications. Following low salt and low fat  diet. Remains nicotine free. Occasionally dizzy when stands up.    Echo 3/16 EF 25-30% Echo 03/2016 EF 20-25%  CPX 05/14/2015  Peak VO2: 19.0 (64.5% predicted peak VO2) VE/VCO2 slope: 41.5 OUES: 2.01 Peak RER: 1.06 Ventilatory Threshold: 14.1     (47.9% predicted or measured peak VO2)   Labs  04/10/2014: K 5.2 Creatinine 1.13    Past Medical History:  Diagnosis Date  . Abnormal TSH    a. 11/2014: felt d/t sick euthyroid.  Marland Kitchen. AICD (automatic cardioverter/defibrillator) present 02/28/2015  . CAD (coronary artery disease)    a. 11/2014: arrest/STEMI s/p Xience DES to the LAD and PTCA to the D1 [place] b. Relook cath 11/2014: no culprit, patent stent.  . CHF (congestive heart failure) (HCC)   . Dressler's syndrome (HCC)    a. 11/2014: tx with colchicine and prednisone.  . Dyslipidemia   . Dyspnea    a. 11/2014: possibly due to combo of CHF and Brilinta. Brilinta changed to Effient.  . Erectile dysfunction    a. Pt aware not to take ED med within 24 hr of NTG and vice versa.  . Hypertension   . Ischemic cardiomyopathy    a. 11/2014: EF reportedly 20% by cath and echo in [place].  Marland Kitchen. NSVT (nonsustained ventricular tachycardia) (HCC) 11/16/14   a. 11/2014: admitted after discharge from STEMI admission, 18 beats 135-140bpm in ED.  Marland Kitchen. Pericardial effusion    a. 11/2014.  Marland Kitchen. Pericarditis dx'd 11/2014  . Pneumonia 1990's X 2  .  RBBB   . Sleep apnea    suspected but not diagnosed (02/28/2015)  . STEMI (ST elevation myocardial infarction) (HCC) 11/10/2014  . Tobacco abuse   . VF (ventricular fibrillation) (HCC) 11/10/14   a. 11/2014: arrest with STEMI.    Past Surgical History:  Procedure Laterality Date  . CARDIAC CATHETERIZATION  11/19/2014  . CARDIAC DEFIBRILLATOR PLACEMENT  02/28/2015  . CORONARY ANGIOPLASTY WITH STENT PLACEMENT  11/10/2014   LAD DES  . HAND SURGERY Bilateral    Dupuytren's Contracture  . IMPLANTABLE CARDIOVERTER DEFIBRILLATOR IMPLANT N/A 02/28/2015     SJM Fortify Assura VR ICD implanted by Dr Johney Frame.  Revascularized following VF arrest, EF remained depressed and ICD implanted  . KNEE ARTHROSCOPY Right 1982  . LEFT HEART CATH N/A 11/19/2014   Procedure: LEFT HEART CATH;  Surgeon: Runell Gess, MD;  Location: [facility] CATH LAB;  Service: Cardiovascular;  Laterality: N/A;    Current Outpatient Prescriptions  Medication Sig Dispense Refill  . aspirin EC 81 MG tablet Take 81 mg by mouth daily.    Marland Kitchen atorvastatin (LIPITOR) 20 MG tablet TAKE 1 TABLET(20 MG) BY MOUTH DAILY 30 tablet 3  . carvedilol (COREG) 12.5 MG tablet TAKE 1 TABLET(12.5 MG) BY MOUTH TWICE DAILY WITH A MEAL 180 tablet 0  . clopidogrel (PLAVIX) 75 MG tablet TAKE 1 TABLET(75 MG) BY MOUTH DAILY 90 tablet 0  . furosemide (LASIX) 20 MG tablet Take 20 mg by mouth every other day.    . nitroGLYCERIN (NITROSTAT) 0.4 MG SL tablet Place 0.4 mg under the tongue every 5 (five) minutes as needed for chest pain. Reported on 12/13/2015    . sacubitril-valsartan (ENTRESTO) 24-26 MG Take 1 tablet by mouth 2 (two) times daily. 60 tablet 6  . spironolactone (ALDACTONE) 25 MG tablet Take 1 tablet (25 mg total) by mouth daily. 90 tablet 3   No current facility-administered medications for this encounter.     Allergies:   Ambien [zolpidem tartrate]; Lisinopril; and Brilinta [ticagrelor]   Social History:  The patient  reports that he quit smoking about 21 months ago. His smoking use included Cigarettes. He has a 80.00 pack-year smoking history. He has never used smokeless tobacco. He reports that he drinks about 8.4 oz of alcohol per week . He reports that he does not use drugs.   Family History:  The patient's  family history includes COPD in his mother; Hypertension in his father and mother.    ROS:  Please see the history of present illness.  Otherwise, review of systems is positive for shortness of breath.  All other systems are reviewed and negative.    PHYSICAL EXAM: VS:  BP 110/68 (BP  Location: Right Arm, Patient Position: Sitting, Cuff Size: Normal)   Pulse (!) 57   Wt 202 lb 8 oz (91.9 kg)   SpO2 97%   BMI 25.31 kg/m  , BMI Body mass index is 25.31 kg/m.  GEN: Well nourished, well developed, in no acute distress  HEENT: normal  Neck: Supple. no JVD, no masses, no carotid bruits Cardiac: RRR without murmur or gallop.         Respiratory:  clear to auscultation bilaterally, normal work of breathing GI: soft, nontender, nondistended, + BS MS: no deformity or atrophy  Ext: no pretibial edema Skin: warm and dry, no rash Neuro:  Strength and sensation are intact Psych: euthymic mood, full affect   Recent Labs: No results found for requested labs within last 8760 hours.   Lipid  Panel     Component Value Date/Time   CHOL 108 01/08/2015 0850   CHOL 101 12/04/2014 1030   TRIG 155.0 (H) 01/08/2015 0850   HDL 39.00 (L) 01/08/2015 0850   HDL 43 12/04/2014 1030   CHOLHDL 3 01/08/2015 0850   VLDL 31.0 01/08/2015 0850   LDLCALC 38 01/08/2015 0850   LDLCALC 39 12/04/2014 1030      Wt Readings from Last 3 Encounters:  09/02/16 202 lb 8 oz (91.9 kg)  08/25/16 201 lb 3.2 oz (91.3 kg)  07/28/16 198 lb 6.4 oz (90 kg)     Cardiac Studies Reviewed: 2D Echo 11/13/2014: Study Conclusions  - Left ventricle: The cavity size was normal. Wall thickness was normal. Systolic function was severely reduced. The estimated ejection fraction was in the range of 20% to 25%. Diastolic dysfunction with elevated LV Filling pressure. Diffuse hypokinesis. - Mitral valve: Mildly thickened leaflets . There was mild regurgitation. - Left atrium: The atrium was at the upper limits of normal in size. - Right atrium: The atrium was mildly dilated. - Pericardium, extracardiac: Small circumferential pericardial effusion. Tamponade physiology is not suspect, but cannot be ruled-out on the basis of this study.  Impressions:  - Compared to the prior echo in 11/2014,  there is now a small circumferential pericardial effusion - no clear tamponade physiology, however, the IVC is dilated. There has been no improvement in LVEF.  ASSESSMENT AND PLAN:  1. Chronic systolic due to ischemic CM - HF EF 25-30% echo 3/16.     --Currently NYHA I-II at baseline. Continue prn lasix.   - Continue Entresto 24/26. Doubt BP will tolerate higher dose.  - Continue lasix 40 qod. Take extra lasix as needed.  - Continue carvedilol 12.5 mg twice a day . Will not increase with heart rate < 60 and soft BP. Can consider stopping lasix at next visit to permit Entresto titration.  - Continue sprio 25 mg daily.   - STJ ICD interrogated personally. Corevue ok. No VT. Could not find PVC counter but last check said < 10% - Discussed concept of interval training (fast/slow walking) to improve exercise tolerance 2.  CAD s/p Anterior MI December 2015:  --No angina -- Continue  ASA, statin and Effient per Dr. Excell Seltzer Lipids per Dr. Eric Form. Goal LDL < 70 3. Tobacco abuse:  --complete smoking cessation (quit at time of MI). 5. Emphysema - follows with Pum[facility]ary   Jennalyn Cawley,MD 3:54 PM

## 2016-09-27 ENCOUNTER — Other Ambulatory Visit (HOSPITAL_COMMUNITY): Payer: Self-pay | Admitting: Internal Medicine

## 2016-10-18 ENCOUNTER — Other Ambulatory Visit: Payer: Self-pay | Admitting: Internal Medicine

## 2016-10-20 ENCOUNTER — Other Ambulatory Visit (HOSPITAL_COMMUNITY): Payer: Self-pay | Admitting: *Deleted

## 2016-10-20 MED ORDER — CLOPIDOGREL BISULFATE 75 MG PO TABS: ORAL_TABLET | ORAL | 3 refills | Status: DC

## 2016-10-21 DIAGNOSIS — H524 Presbyopia: Secondary | ICD-10-CM | POA: Diagnosis not present

## 2016-10-21 DIAGNOSIS — H5203 Hypermetropia, bilateral: Secondary | ICD-10-CM | POA: Diagnosis not present

## 2016-11-06 ENCOUNTER — Encounter (HOSPITAL_COMMUNITY): Payer: PPO | Admitting: Internal Medicine

## 2016-11-10 DIAGNOSIS — J209 Acute bronchitis, unspecified: Secondary | ICD-10-CM | POA: Diagnosis not present

## 2016-11-10 DIAGNOSIS — Z6824 Body mass index (BMI) 24.0-24.9, adult: Secondary | ICD-10-CM | POA: Diagnosis not present

## 2016-11-10 DIAGNOSIS — R0602 Shortness of breath: Secondary | ICD-10-CM | POA: Diagnosis not present

## 2016-11-10 DIAGNOSIS — I5022 Chronic systolic (congestive) heart failure: Secondary | ICD-10-CM | POA: Diagnosis not present

## 2016-11-10 DIAGNOSIS — R05 Cough: Secondary | ICD-10-CM | POA: Diagnosis not present

## 2016-11-13 ENCOUNTER — Encounter (HOSPITAL_COMMUNITY): Payer: Self-pay | Admitting: Internal Medicine

## 2016-11-13 ENCOUNTER — Ambulatory Visit (HOSPITAL_COMMUNITY)
Admission: RE | Admit: 2016-11-13 | Discharge: 2016-11-13 | Disposition: A | Discharge status: Home / Self Care | Payer: PPO | Source: Ambulatory Visit | Attending: Internal Medicine | Admitting: Internal Medicine

## 2016-11-13 VITALS — BP 102/60 | HR 70 | Wt 201.0 lb

## 2016-11-13 DIAGNOSIS — Z7982 Long term (current) use of aspirin: Secondary | ICD-10-CM | POA: Diagnosis not present

## 2016-11-13 DIAGNOSIS — Z87891 Personal history of nicotine dependence: Secondary | ICD-10-CM | POA: Diagnosis not present

## 2016-11-13 DIAGNOSIS — Z9861 Coronary angioplasty status: Secondary | ICD-10-CM | POA: Diagnosis not present

## 2016-11-13 DIAGNOSIS — I2510 Atherosclerotic heart disease of native coronary artery without angina pectoris: Secondary | ICD-10-CM | POA: Diagnosis not present

## 2016-11-13 DIAGNOSIS — I4510 Unspecified right bundle-branch block: Secondary | ICD-10-CM | POA: Diagnosis not present

## 2016-11-13 DIAGNOSIS — N529 Male erectile dysfunction, unspecified: Secondary | ICD-10-CM | POA: Diagnosis not present

## 2016-11-13 DIAGNOSIS — I252 Old myocardial infarction: Secondary | ICD-10-CM | POA: Diagnosis not present

## 2016-11-13 DIAGNOSIS — I255 Ischemic cardiomyopathy: Secondary | ICD-10-CM | POA: Insufficient documentation

## 2016-11-13 DIAGNOSIS — E785 Hyperlipidemia, unspecified: Secondary | ICD-10-CM | POA: Diagnosis not present

## 2016-11-13 DIAGNOSIS — I5022 Chronic systolic (congestive) heart failure: Secondary | ICD-10-CM | POA: Insufficient documentation

## 2016-11-13 DIAGNOSIS — J449 Chronic obstructive pulmonary disease, unspecified: Secondary | ICD-10-CM | POA: Insufficient documentation

## 2016-11-13 DIAGNOSIS — G4730 Sleep apnea, unspecified: Secondary | ICD-10-CM | POA: Diagnosis not present

## 2016-11-13 DIAGNOSIS — I110 Hypertensive heart disease with heart failure: Secondary | ICD-10-CM | POA: Diagnosis not present

## 2016-11-13 DIAGNOSIS — Z95810 Presence of automatic (implantable) cardiac defibrillator: Secondary | ICD-10-CM | POA: Insufficient documentation

## 2016-11-13 DIAGNOSIS — Z955 Presence of coronary angioplasty implant and graft: Secondary | ICD-10-CM | POA: Insufficient documentation

## 2016-11-13 MED ORDER — SACUBITRIL-VALSARTAN 24-26 MG PO TABS: 1.0000 | ORAL_TABLET | Freq: Two times a day (BID) | ORAL | 2 refills | Status: DC

## 2016-11-13 NOTE — Addendum Note (Signed)
Encounter addended by: Noralee SpaceHeather M Medina Degraffenreid, RN on: 11/13/2016  3:17 PM<BR>    Actions taken: Alternative orders not taken and original order placed, Diagnosis association updated, Order list changed, Sign clinical note

## 2016-11-13 NOTE — Progress Notes (Signed)
Patient ID: Kyle MuttonJonathan D Reid, male   DOB: [birth year], 65 y.o.   MRN: [id number]    Advanced HF Clinic Note   History of Present Illness: Kyle Bullock is a 65 y.o. male who is referred to the HF Clinic by Dr. Excell Seltzerooper. He has h/o CAD and ischemic cardiomyopathy 20-25%.  He is S/P Biomedical engineert Jude Single Chamber ICD on 02/28/2015.    In December 2015, he suffered an acute anterolateral MI complicated by VF arrest in [place]. He underwent drug-eluting stent placement to the LAD and PTCA of the diagonal. EF was 25% and a life vest was placed prior to discharge. Following return to [place], he developed dyspnea and presented to [facility] where he was readmitted. His Brilinta was switched to Effient as it was felt the Brilinta may have been playing a role in his dyspnea. He was also switched from an ACE inhibitor to an ARB. He was seen by electrophysiology with recommendation for ongoing life vest therapy and follow-up echo in 90 days post revascularization. He was then seen in clinic in mid December where he reported pleuritic chest pain. He was readmitted and underwent diagnostic catheterization revealing patency of previously placed stent and area of balloon angioplasty in the diagonal. EF was 20-25% with diffuse hypokinesis and normal pericardial appearance. He did have some hypotension requiring discontinuation of ARB therapy. His beta blocker was also down titrated. He was diagnosed with Dressler's syndrome and was placed on prednisone taper and colchicine.  We last saw him in 6/16 was doing well. CPX and CT with COPD. Referred to Dr. Kendrick FriesMcQuaid who felt he mad mild COPD but no ILD. Given PRN albuterol. Has repeat CT scheduled for 1 year.   He returns for heart failure follow up.  Overall doing well. Recently struggling with URI. Working [employer].  Continues with his usual 2.5 miles daily in 50 minutes. Doing some interval hillwork.  Denies SOB/PND/Orthopnea. Weight at home stable 195-202 pounds  Taking all  medications. Following low salt and low fat diet. Remains nicotine free. Occasionally dizzy when stands up. BP remains low. Systolics occasionally in 80s.    Echo 3/16 EF 25-30% Echo 03/2016 EF 20-25%  CPX 05/14/2015  Peak VO2: 19.0 (64.5% predicted peak VO2) VE/VCO2 slope: 41.5 OUES: 2.01 Peak RER: 1.06 Ventilatory Threshold: 14.1     (47.9% predicted or measured peak VO2)   Labs  04/10/2014: K 5.2 Creatinine 1.13    Past Medical History:  Diagnosis Date  . Abnormal TSH    a. 11/2014: felt d/t sick euthyroid.  Marland Kitchen. AICD (automatic cardioverter/defibrillator) present 02/28/2015  . CAD (coronary artery disease)    a. 11/2014: arrest/STEMI s/p Xience DES to the LAD and PTCA to the D1 [place] b. Relook cath 11/2014: no culprit, patent stent.  . CHF (congestive heart failure) (HCC)   . Dressler's syndrome (HCC)    a. 11/2014: tx with colchicine and prednisone.  . Dyslipidemia   . Dyspnea    a. 11/2014: possibly due to combo of CHF and Brilinta. Brilinta changed to Effient.  . Erectile dysfunction    a. Pt aware not to take ED med within 24 hr of NTG and vice versa.  . Hypertension   . Ischemic cardiomyopathy    a. 11/2014: EF reportedly 20% by cath and echo in [place].  Marland Kitchen. NSVT (nonsustained ventricular tachycardia) (HCC) 11/16/14   a. 11/2014: admitted after discharge from STEMI admission, 18 beats 135-140bpm in ED.  Marland Kitchen. Pericardial effusion    a.  11/2014.  Marland Kitchen. Pericarditis dx'd 11/2014  . Pneumonia 1990's X 2  . RBBB   . Sleep apnea    suspected but not diagnosed (02/28/2015)  . STEMI (ST elevation myocardial infarction) (HCC) 11/10/2014  . Tobacco abuse   . VF (ventricular fibrillation) (HCC) 11/10/14   a. 11/2014: arrest with STEMI.    Past Surgical History:  Procedure Laterality Date  . CARDIAC CATHETERIZATION  11/19/2014  . CARDIAC DEFIBRILLATOR PLACEMENT  02/28/2015  . CORONARY ANGIOPLASTY WITH STENT PLACEMENT  11/10/2014   LAD DES  . HAND SURGERY Bilateral      Dupuytren's Contracture  . IMPLANTABLE CARDIOVERTER DEFIBRILLATOR IMPLANT N/A 02/28/2015   SJM Fortify Assura VR ICD implanted by Dr Johney FrameAllred.  Revascularized following VF arrest, EF remained depressed and ICD implanted  . KNEE ARTHROSCOPY Right 1982  . LEFT HEART CATH N/A 11/19/2014   Procedure: LEFT HEART CATH;  Surgeon: Runell GessJonathan J Berry, MD;  [facility];  Service: Cardiovascular;  Laterality: N/A;    Current Outpatient Prescriptions  Medication Sig Dispense Refill  . acetaminophen (TYLENOL) 500 MG tablet Take 500 mg by mouth 2 (two) times daily as needed.    Marland Kitchen. aspirin EC 81 MG tablet Take 81 mg by mouth daily.    Marland Kitchen. atorvastatin (LIPITOR) 20 MG tablet TAKE 1 TABLET(20 MG) BY MOUTH DAILY 30 tablet 3  . benzonatate (TESSALON) 100 MG capsule Take 100 mg by mouth 2 (two) times daily as needed for cough.    . carvedilol (COREG) 12.5 MG tablet TAKE 1 TABLET(12.5 MG) BY MOUTH TWICE DAILY WITH A MEAL 180 tablet 0  . cefdinir (OMNICEF) 300 MG capsule Take 300 mg by mouth 2 (two) times daily.    . clopidogrel (PLAVIX) 75 MG tablet TAKE 1 TABLET(75 MG) BY MOUTH DAILY 90 tablet 3  . furosemide (LASIX) 20 MG tablet Take 20 mg by mouth every other day.    Marland Kitchen. guaiFENesin (MUCINEX) 600 MG 12 hr tablet Take 600 mg by mouth 2 (two) times daily as needed.    Marland Kitchen. HYDROcodone-homatropine (HYCODAN) 5-1.5 MG/5ML syrup Take 5 mLs by mouth at bedtime as needed for cough.    . nitroGLYCERIN (NITROSTAT) 0.4 MG SL tablet Place 0.4 mg under the tongue every 5 (five) minutes as needed for chest pain. Reported on 12/13/2015    . sacubitril-valsartan (ENTRESTO) 24-26 MG Take 1 tablet by mouth 2 (two) times daily. 60 tablet 6  . spironolactone (ALDACTONE) 25 MG tablet Take 1 tablet (25 mg total) by mouth daily. 90 tablet 3   No current facility-administered medications for this encounter.     Allergies:   Ambien [zolpidem tartrate]; Lisinopril; and Brilinta [ticagrelor]   Social History:  The patient  reports  that he quit smoking about 2 years ago. His smoking use included Cigarettes. He has a 80.00 pack-year smoking history. He has never used smokeless tobacco. He reports that he drinks about 8.4 oz of alcohol per week . He reports that he does not use drugs.   Family History:  The patient's  family history includes COPD in his mother; Hypertension in his father and mother.    ROS:  Please see the history of present illness.  Otherwise, review of systems is positive for shortness of breath.  All other systems are reviewed and negative.    PHYSICAL EXAM: VS:  BP 102/60   Pulse 70   Wt 201 lb (91.2 kg)   SpO2 97%   BMI 25.12 kg/m  , BMI Body  mass index is 25.12 kg/m.  GEN: Well nourished, well developed, in no acute distress  HEENT: normal  Neck: Supple. no JVD, no masses, no carotid bruits Cardiac: RRR without murmur or gallop.         Respiratory:  clear to auscultation bilaterally, normal work of breathing GI: soft, nontender, nondistended, + BS MS: no deformity or atrophy  Ext: no pretibial edema Skin: warm and dry, no rash Neuro:  Strength and sensation are intact Psych: euthymic mood, full affect   Recent Labs: No results found for requested labs within last 8760 hours.   Lipid Panel     Component Value Date/Time   CHOL 108 01/08/2015 0850   CHOL 101 12/04/2014 1030   TRIG 155.0 (H) 01/08/2015 0850   HDL 39.00 (L) 01/08/2015 0850   HDL 43 12/04/2014 1030   CHOLHDL 3 01/08/2015 0850   VLDL 31.0 01/08/2015 0850   LDLCALC 38 01/08/2015 0850   LDLCALC 39 12/04/2014 1030      Wt Readings from Last 3 Encounters:  11/13/16 201 lb (91.2 kg)  09/02/16 202 lb 8 oz (91.9 kg)  08/25/16 201 lb 3.2 oz (91.3 kg)     Cardiac Studies Reviewed: 2D Echo 11/13/2014: Study Conclusions  - Left ventricle: The cavity size was normal. Wall thickness was normal. Systolic function was severely reduced. The estimated ejection fraction was in the range of 20% to 25%.  Diastolic dysfunction with elevated LV Filling pressure. Diffuse hypokinesis. - Mitral valve: Mildly thickened leaflets . There was mild regurgitation. - Left atrium: The atrium was at the upper limits of normal in size. - Right atrium: The atrium was mildly dilated. - Pericardium, extracardiac: Small circumferential pericardial effusion. Tamponade physiology is not suspect, but cannot be ruled-out on the basis of this study.  Impressions:  - Compared to the prior echo in 11/2014, there is now a small circumferential pericardial effusion - no clear tamponade physiology, however, the IVC is dilated. There has been no improvement in LVEF.  ASSESSMENT AND PLAN:  1. Chronic systolic due to ischemic CM - HF EF 25-30% echo 3/16.     --Currently NYHA I-II. - Continue Entresto 24/26. Doubt BP will tolerate higher dose.  - Volume status looks good. Continue lasix 40 qod. Take extra lasix as needed.  - Continue carvedilol 12.5 mg twice a day . Will not increase with heart rate < 60 and soft BP. Can consider stopping lasix at next visit to permit Entresto titration.  - Continue sprio 25 mg daily.   - Overall doing quite well but BP remains soft and EF in 20-25% range. Will get CPX testing for further risk stratification. - Labs with Dr. Clelia Croft next month. 2.  CAD s/p Anterior MI December 2015:  --No angina -- Continue  ASA, statin and Effient per Dr. Excell Seltzer Lipids per Dr. Eric Form. Goal LDL < 70 3. Tobacco abuse:  --complete smoking cessation (quit at time of MI). 5. Emphysema - follows with Pumonary   Renalda Locklin,MD 3:01 PM

## 2016-11-13 NOTE — Patient Instructions (Signed)
Your physician has recommended that you have a cardiopulmonary stress test (CPX). CPX testing is a non-invasive measurement of heart and lung function. It replaces a traditional treadmill stress test. This type of test provides a tremendous amount of information that relates not only to your present condition but also for future outcomes. This test combines measurements of you ventilation, respiratory gas exchange in the lungs, electrocardiogram (EKG), blood pressure and physical response before, during, and following an exercise protocol.  We will contact you in 4 months to schedule your next appointment.  

## 2016-11-24 ENCOUNTER — Ambulatory Visit (INDEPENDENT_AMBULATORY_CARE_PROVIDER_SITE_OTHER): Payer: PPO | Admitting: *Deleted

## 2016-11-24 DIAGNOSIS — I255 Ischemic cardiomyopathy: Secondary | ICD-10-CM | POA: Diagnosis not present

## 2016-11-24 NOTE — Progress Notes (Signed)
Remote ICD transmission.   

## 2016-11-25 ENCOUNTER — Other Ambulatory Visit (HOSPITAL_COMMUNITY): Payer: Self-pay | Admitting: Internal Medicine

## 2016-11-25 LAB — CUP PACEART REMOTE DEVICE CHECK
Date Time Interrogation Session: 20171218070020
HIGH POWER IMPEDANCE MEASURED VALUE: 59 Ohm
HIGH POWER IMPEDANCE MEASURED VALUE: 59 Ohm
Implantable Lead Implant Date: 20160323
Implantable Pulse Generator Implant Date: 20160323
Lead Channel Impedance Value: 360 Ohm
Lead Channel Pacing Threshold Amplitude: 0.5 V
Lead Channel Sensing Intrinsic Amplitude: 9.7 mV
Lead Channel Setting Pacing Amplitude: 2.5 V
Lead Channel Setting Sensing Sensitivity: 0.5 mV
MDC IDC LEAD LOCATION: 753860
MDC IDC MSMT BATTERY REMAINING LONGEVITY: 92 mo
MDC IDC MSMT BATTERY REMAINING PERCENTAGE: 84 %
MDC IDC MSMT BATTERY VOLTAGE: 3.07 V
MDC IDC MSMT LEADCHNL RV PACING THRESHOLD PULSEWIDTH: 0.5 ms
MDC IDC SET LEADCHNL RV PACING PULSEWIDTH: 0.5 ms
MDC IDC STAT BRADY RV PERCENT PACED: 1 %
Pulse Gen Serial Number: 7255600

## 2016-11-26 ENCOUNTER — Encounter: Payer: Self-pay | Admitting: Cardiology

## 2016-12-11 DIAGNOSIS — I10 Essential (primary) hypertension: Secondary | ICD-10-CM | POA: Diagnosis not present

## 2016-12-11 DIAGNOSIS — Z125 Encounter for screening for malignant neoplasm of prostate: Secondary | ICD-10-CM | POA: Diagnosis not present

## 2016-12-11 DIAGNOSIS — E038 Other specified hypothyroidism: Secondary | ICD-10-CM | POA: Diagnosis not present

## 2016-12-11 DIAGNOSIS — E784 Other hyperlipidemia: Secondary | ICD-10-CM | POA: Diagnosis not present

## 2016-12-12 ENCOUNTER — Encounter: Payer: Self-pay | Admitting: Cardiology

## 2016-12-18 DIAGNOSIS — F17201 Nicotine dependence, unspecified, in remission: Secondary | ICD-10-CM | POA: Diagnosis not present

## 2016-12-18 DIAGNOSIS — J432 Centrilobular emphysema: Secondary | ICD-10-CM | POA: Diagnosis not present

## 2016-12-18 DIAGNOSIS — Z9861 Coronary angioplasty status: Secondary | ICD-10-CM | POA: Diagnosis not present

## 2016-12-18 DIAGNOSIS — E039 Hypothyroidism, unspecified: Secondary | ICD-10-CM | POA: Diagnosis not present

## 2016-12-18 DIAGNOSIS — I255 Ischemic cardiomyopathy: Secondary | ICD-10-CM | POA: Diagnosis not present

## 2016-12-18 DIAGNOSIS — Z125 Encounter for screening for malignant neoplasm of prostate: Secondary | ICD-10-CM | POA: Diagnosis not present

## 2016-12-18 DIAGNOSIS — Z1389 Encounter for screening for other disorder: Secondary | ICD-10-CM | POA: Diagnosis not present

## 2016-12-18 DIAGNOSIS — Z23 Encounter for immunization: Secondary | ICD-10-CM | POA: Diagnosis not present

## 2016-12-18 DIAGNOSIS — E785 Hyperlipidemia, unspecified: Secondary | ICD-10-CM | POA: Diagnosis not present

## 2016-12-18 DIAGNOSIS — I272 Other secondary pulmonary hypertension: Secondary | ICD-10-CM | POA: Diagnosis not present

## 2016-12-18 DIAGNOSIS — I5022 Chronic systolic (congestive) heart failure: Secondary | ICD-10-CM | POA: Diagnosis not present

## 2016-12-18 DIAGNOSIS — I10 Essential (primary) hypertension: Secondary | ICD-10-CM | POA: Diagnosis not present

## 2016-12-19 ENCOUNTER — Other Ambulatory Visit: Payer: Self-pay | Admitting: Internal Medicine

## 2016-12-19 DIAGNOSIS — J849 Interstitial pulmonary disease, unspecified: Secondary | ICD-10-CM

## 2017-01-01 ENCOUNTER — Other Ambulatory Visit (HOSPITAL_COMMUNITY): Payer: Self-pay | Admitting: Internal Medicine

## 2017-01-12 ENCOUNTER — Encounter (HOSPITAL_COMMUNITY): Payer: PPO

## 2017-01-13 DIAGNOSIS — Z1212 Encounter for screening for malignant neoplasm of rectum: Secondary | ICD-10-CM | POA: Diagnosis not present

## 2017-01-16 ENCOUNTER — Other Ambulatory Visit (HOSPITAL_COMMUNITY): Payer: Self-pay | Admitting: Internal Medicine

## 2017-01-20 ENCOUNTER — Other Ambulatory Visit (HOSPITAL_COMMUNITY): Payer: Self-pay | Admitting: Internal Medicine

## 2017-01-21 ENCOUNTER — Ambulatory Visit
Admission: RE | Admit: 2017-01-21 | Discharge: 2017-01-21 | Disposition: A | Discharge status: Home / Self Care | Payer: PPO | Source: Ambulatory Visit | Attending: Internal Medicine | Admitting: Internal Medicine

## 2017-01-21 DIAGNOSIS — R918 Other nonspecific abnormal finding of lung field: Secondary | ICD-10-CM | POA: Diagnosis not present

## 2017-01-21 DIAGNOSIS — J849 Interstitial pulmonary disease, unspecified: Secondary | ICD-10-CM

## 2017-01-26 ENCOUNTER — Other Ambulatory Visit (HOSPITAL_COMMUNITY): Payer: Self-pay | Admitting: Cardiology

## 2017-01-26 DIAGNOSIS — I255 Ischemic cardiomyopathy: Secondary | ICD-10-CM

## 2017-01-26 DIAGNOSIS — I10 Essential (primary) hypertension: Secondary | ICD-10-CM

## 2017-01-26 DIAGNOSIS — I5022 Chronic systolic (congestive) heart failure: Secondary | ICD-10-CM

## 2017-01-26 DIAGNOSIS — I4510 Unspecified right bundle-branch block: Secondary | ICD-10-CM

## 2017-01-26 MED ORDER — SPIRONOLACTONE 25 MG PO TABS: 25.0000 mg | ORAL_TABLET | Freq: Every day | ORAL | 3 refills | Status: DC

## 2017-01-27 ENCOUNTER — Ambulatory Visit (HOSPITAL_COMMUNITY): Payer: PPO | Attending: Cardiology

## 2017-01-27 DIAGNOSIS — I5022 Chronic systolic (congestive) heart failure: Secondary | ICD-10-CM

## 2017-01-27 DIAGNOSIS — I4510 Unspecified right bundle-branch block: Secondary | ICD-10-CM | POA: Diagnosis not present

## 2017-02-23 ENCOUNTER — Ambulatory Visit (INDEPENDENT_AMBULATORY_CARE_PROVIDER_SITE_OTHER): Payer: PPO | Admitting: *Deleted

## 2017-02-23 DIAGNOSIS — I255 Ischemic cardiomyopathy: Secondary | ICD-10-CM | POA: Diagnosis not present

## 2017-02-23 NOTE — Progress Notes (Signed)
Remote ICD transmission.   

## 2017-02-25 ENCOUNTER — Encounter: Payer: Self-pay | Admitting: Cardiology

## 2017-02-25 LAB — CUP PACEART REMOTE DEVICE CHECK
Battery Remaining Longevity: 91 mo
Date Time Interrogation Session: 20180319060016
HIGH POWER IMPEDANCE MEASURED VALUE: 71 Ohm
HighPow Impedance: 71 Ohm
Implantable Lead Location: 753860
Implantable Pulse Generator Implant Date: 20160323
Lead Channel Impedance Value: 410 Ohm
Lead Channel Setting Pacing Amplitude: 2.5 V
Lead Channel Setting Pacing Pulse Width: 0.5 ms
MDC IDC LEAD IMPLANT DT: 20160323
MDC IDC MSMT BATTERY REMAINING PERCENTAGE: 83 %
MDC IDC MSMT BATTERY VOLTAGE: 3.05 V
MDC IDC MSMT LEADCHNL RV PACING THRESHOLD AMPLITUDE: 0.5 V
MDC IDC MSMT LEADCHNL RV PACING THRESHOLD PULSEWIDTH: 0.5 ms
MDC IDC MSMT LEADCHNL RV SENSING INTR AMPL: 11.9 mV
MDC IDC SET LEADCHNL RV SENSING SENSITIVITY: 0.5 mV
MDC IDC STAT BRADY RV PERCENT PACED: 1 %
Pulse Gen Serial Number: 7255600

## 2017-03-11 ENCOUNTER — Encounter: Payer: Self-pay | Admitting: Cardiology

## 2017-03-12 ENCOUNTER — Ambulatory Visit (HOSPITAL_COMMUNITY)
Admission: RE | Admit: 2017-03-12 | Discharge: 2017-03-12 | Disposition: A | Discharge status: Home / Self Care | Payer: PPO | Source: Ambulatory Visit | Attending: Internal Medicine | Admitting: Internal Medicine

## 2017-03-12 ENCOUNTER — Encounter (HOSPITAL_COMMUNITY): Payer: Self-pay | Admitting: Internal Medicine

## 2017-03-12 VITALS — BP 98/58 | HR 63 | Wt 207.0 lb

## 2017-03-12 DIAGNOSIS — J449 Chronic obstructive pulmonary disease, unspecified: Secondary | ICD-10-CM | POA: Insufficient documentation

## 2017-03-12 DIAGNOSIS — Z955 Presence of coronary angioplasty implant and graft: Secondary | ICD-10-CM | POA: Insufficient documentation

## 2017-03-12 DIAGNOSIS — I959 Hypotension, unspecified: Secondary | ICD-10-CM | POA: Insufficient documentation

## 2017-03-12 DIAGNOSIS — E785 Hyperlipidemia, unspecified: Secondary | ICD-10-CM | POA: Insufficient documentation

## 2017-03-12 DIAGNOSIS — G4730 Sleep apnea, unspecified: Secondary | ICD-10-CM | POA: Insufficient documentation

## 2017-03-12 DIAGNOSIS — I110 Hypertensive heart disease with heart failure: Secondary | ICD-10-CM | POA: Insufficient documentation

## 2017-03-12 DIAGNOSIS — I252 Old myocardial infarction: Secondary | ICD-10-CM | POA: Insufficient documentation

## 2017-03-12 DIAGNOSIS — Z87891 Personal history of nicotine dependence: Secondary | ICD-10-CM | POA: Insufficient documentation

## 2017-03-12 DIAGNOSIS — I4510 Unspecified right bundle-branch block: Secondary | ICD-10-CM | POA: Diagnosis not present

## 2017-03-12 DIAGNOSIS — I241 Dressler's syndrome: Secondary | ICD-10-CM | POA: Diagnosis not present

## 2017-03-12 DIAGNOSIS — Z7982 Long term (current) use of aspirin: Secondary | ICD-10-CM | POA: Insufficient documentation

## 2017-03-12 DIAGNOSIS — Z95810 Presence of automatic (implantable) cardiac defibrillator: Secondary | ICD-10-CM | POA: Insufficient documentation

## 2017-03-12 DIAGNOSIS — I255 Ischemic cardiomyopathy: Secondary | ICD-10-CM

## 2017-03-12 DIAGNOSIS — I2510 Atherosclerotic heart disease of native coronary artery without angina pectoris: Secondary | ICD-10-CM

## 2017-03-12 DIAGNOSIS — I5022 Chronic systolic (congestive) heart failure: Secondary | ICD-10-CM

## 2017-03-12 DIAGNOSIS — Z7902 Long term (current) use of antithrombotics/antiplatelets: Secondary | ICD-10-CM | POA: Insufficient documentation

## 2017-03-12 MED ORDER — CARVEDILOL 6.25 MG PO TABS: 6.2500 mg | ORAL_TABLET | Freq: Two times a day (BID) | ORAL | 3 refills | Status: DC

## 2017-03-12 NOTE — Patient Instructions (Signed)
Decrease Carvedilol to 6.25mg  twice daily.  Please call our office at [phone] in July to schedule your 4 month follow up with Dr.[name].

## 2017-03-12 NOTE — Progress Notes (Signed)
Patient ID: Kyle Bullock, male   DOB: 1951-01-18, 66 y.o.   MRN: [id number]    Advanced HF Clinic Note   History of Present Illness: Kyle Bullock is a 66 y.o. male who is referred to the HF Clinic by Dr. Excell Seltzer. He has h/o CAD and ischemic cardiomyopathy 20-25%.  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    In December 2015, he suffered an acute anterolateral MI complicated by VF arrest in [place]. He underwent drug-eluting stent placement to the LAD and PTCA of the diagonal. EF was 25% and a life vest was placed prior to discharge. Following return to [place], he developed dyspnea and presented to [facility] where he was readmitted. His Brilinta was switched to Effient as it was felt the Brilinta may have been playing a role in his dyspnea. He was also switched from an ACE inhibitor to an ARB. He was seen by electrophysiology with recommendation for ongoing life vest therapy and follow-up echo in 90 days post revascularization. He was then seen in clinic in mid December where he reported pleuritic chest pain. He was readmitted and underwent diagnostic catheterization revealing patency of previously placed stent and area of balloon angioplasty in the diagonal. EF was 20-25% with diffuse hypokinesis and normal pericardial appearance. He did have some hypotension requiring discontinuation of ARB therapy. His beta blocker was also down titrated. He was diagnosed with Dressler's syndrome and was placed on prednisone taper and colchicine.   CT with COPD. Referred to Dr. Kendrick Fries who felt he mad mild COPD but no ILD. Given PRN albuterol. Has repeat CT scheduled for 1 year.   He returns for heart failure follow up.  Doing well. Had CPX test in February and did well. Working FT 8-10 hours per day..  Continues with his usual 2.5 miles daily in 50 minutes. Doing some interval hillwork.  Denies SOB, orthopnea or PND. Weight at home stable 195-202 pounds  Taking all medications. Following  low salt and low fat diet. Remains nicotine free. BP still running in 90s. Get dizzy occasionally when standing up. Takes lasix as needed.    Echo 3/16 EF 25-30% Echo 03/2016 EF 20-25%  CPX 2/18 FVC 3.49 (72%)    FEV1 2.49 (68%)     FEV1/FVC 71 (93%)     MVV 117 (82%) Resting HR: 62 Peak HR: 148  (97% age predicted max HR) BP rest: 84/60 BP peak: 156/62 Peak VO2: 19.6 (69% predicted peak VO2) VE/VCO2 slope: 37 OUES: 2.14 Peak RER: 1.06 Ventilatory Threshold: 17.4 (61% predicted or measured peak VO2) VE/MVV: 66% O2pulse: 12  (71% predicted O2pulse)  CPX 05/14/2015  Peak VO2: 19.0 (64.5% predicted peak VO2) VE/VCO2 slope: 41.5 OUES: 2.01 Peak RER: 1.06 Ventilatory Threshold: 14.1     (47.9% predicted or measured peak VO2)   Labs  04/10/2014: K 5.2 Creatinine 1.13    Past Medical History:  Diagnosis Date  . Abnormal TSH    a. 11/2014: felt d/t sick euthyroid.  Marland Kitchen AICD (automatic cardioverter/defibrillator) present 02/28/2015  . CAD (coronary artery disease)    a. 11/2014: arrest/STEMI s/p Xience DES to the LAD and PTCA to the D1 [place], [place]). b. Relook cath 11/2014: no culprit, patent stent.  . CHF (congestive heart failure) (HCC)   . Dressler's syndrome (HCC)    a. 11/2014: tx with colchicine and prednisone.  . Dyslipidemia   . Dyspnea    a. 11/2014: possibly due to combo of CHF and Brilinta. Brilinta  changed to Effient.  . Erectile dysfunction    a. Pt aware not to take ED med within 24 hr of NTG and vice versa.  . Hypertension   . Ischemic cardiomyopathy    a. 11/2014: EF reportedly 20% by cath and echo in [place].  Marland Kitchen NSVT (nonsustained ventricular tachycardia) (HCC) 11/16/14   a. 11/2014: admitted after discharge from STEMI admission, 18 beats 135-140bpm in ED.  Marland Kitchen Pericardial effusion    a. 11/2014.  Marland Kitchen Pericarditis dx'd 11/2014  . Pneumonia 1990's X 2  . RBBB   . Sleep apnea    suspected but not diagnosed (02/28/2015)  . STEMI (ST elevation  myocardial infarction) (HCC) 11/10/2014  . Tobacco abuse   . VF (ventricular fibrillation) (HCC) 11/10/14   a. 11/2014: arrest with STEMI.    Past Surgical History:  Procedure Laterality Date  . CARDIAC CATHETERIZATION  11/19/2014  . CARDIAC DEFIBRILLATOR PLACEMENT  02/28/2015  . CORONARY ANGIOPLASTY WITH STENT PLACEMENT  11/10/2014   LAD DES  . HAND SURGERY Bilateral    Dupuytren's Contracture  . IMPLANTABLE CARDIOVERTER DEFIBRILLATOR IMPLANT N/A 02/28/2015   SJM Fortify Assura VR ICD implanted by Dr Johney Frame.  Revascularized following VF arrest, EF remained depressed and ICD implanted  . KNEE ARTHROSCOPY Right 1982  . LEFT HEART CATH N/A 11/19/2014   Procedure: LEFT HEART CATH;  Surgeon: Runell Gess, MD;  Location[facility] C[facility] LAB;  Service: Cardiovascular;  Laterality: N/A;    Current Outpatient Prescriptions  Medication Sig Dispense Refill  . acetaminophen (TYLENOL) 500 MG tablet Take 500 mg by mouth 2 (two) times daily as needed.    Marland Kitchen aspirin EC 81 MG tablet Take 81 mg by mouth daily.    Marland Kitchen atorvastatin (LIPITOR) 20 MG tablet TAKE 1 TABLET(20 MG) BY MOUTH DAILY 30 tablet 3  . carvedilol (COREG) 12.5 MG tablet TAKE 1 TABLET(12.5 MG) BY MOUTH TWICE DAILY WITH A MEAL 180 tablet 0  . cefdinir (OMNICEF) 300 MG capsule Take 300 mg by mouth 2 (two) times daily.    . clopidogrel (PLAVIX) 75 MG tablet TAKE 1 TABLET(75 MG) BY MOUTH DAILY 90 tablet 3  . furosemide (LASIX) 20 MG tablet Take 20 mg by mouth every other day.    Marland Kitchen guaiFENesin (MUCINEX) 600 MG 12 hr tablet Take 600 mg by mouth 2 (two) times daily as needed.    . sacubitril-valsartan (ENTRESTO) 24-26 MG Take 1 tablet by mouth 2 (two) times daily. 180 tablet 2  . spironolactone (ALDACTONE) 25 MG tablet Take 1 tablet (25 mg total) by mouth daily. 90 tablet 3  . benzonatate (TESSALON) 100 MG capsule Take 100 mg by mouth 2 (two) times daily as needed for cough.    Marland Kitchen HYDROcodone-homatropine (HYCODAN) 5-1.5 MG/5ML syrup Take 5 mLs by mouth  at bedtime as needed for cough.    . nitroGLYCERIN (NITROSTAT) 0.4 MG SL tablet Place 0.4 mg under the tongue every 5 (five) minutes as needed for chest pain. Reported on 12/13/2015     No current facility-administered medications for this encounter.     Allergies:   Ambien [zolpidem tartrate]; Lisinopril; and Brilinta [ticagrelor]   Social History:  The patient  reports that he quit smoking about 2 years ago. His smoking use included Cigarettes. He has a 80.00 pack-year smoking history. He has never used smokeless tobacco. He reports that he drinks about 8.4 oz of alcohol per week . He reports that he does not use drugs.   Family History:  The patient's  family history includes COPD in his mother; Hypertension in his father and mother.    ROS:  Please see the history of present illness.  Otherwise, review of systems is positive for shortness of breath.  All other systems are reviewed and negative.    PHYSICAL EXAM: VS:  BP (!) 98/58   Pulse 63   Wt 207 lb (93.9 kg)   SpO2 99%   BMI 25.87 kg/m  , BMI Body mass index is 25.87 kg/m.  Physical exam: General:  Well appearing. No resp difficulty HEENT: normal Neck: supple. no JVD. Carotids 2+ bilat; no bruits. No lymphadenopathy or thryomegaly appreciated. Cor: PMI  Laterally displaced. Regular rate & rhythm. No rubs, gallops or murmurs. Lungs: clear with slightly diminished BS Abdomen: soft, nontender, nondistended. No hepatosplenomegaly. No bruits or masses. Good bowel sounds. Extremities: no cyanosis, clubbing, rash, edema Neuro: alert & orientedx3, cranial nerves grossly intact. moves all 4 extremities w/o difficulty. Affect pleasant    Recent Labs: No results found for requested labs within last 8760 hours.   Lipid Panel     Component Value Date/Time   CHOL 108 01/08/2015 0850   CHOL 101 12/04/2014 1030   TRIG 155.0 (H) 01/08/2015 0850   HDL 39.00 (L) 01/08/2015 0850   HDL 43 12/04/2014 1030   CHOLHDL 3 01/08/2015 0850     VLDL 31.0 01/08/2015 0850   LDLCALC 38 01/08/2015 0850   LDLCALC 39 12/04/2014 1030      Wt Readings from Last 3 Encounters:  03/12/17 207 lb (93.9 kg)  11/13/16 201 lb (91.2 kg)  09/02/16 202 lb 8 oz (91.9 kg)     Cardiac Studies Reviewed: 2D Echo 11/13/2014: Study Conclusions  - Left ventricle: The cavity size was normal. Wall thickness was normal. Systolic function was severely reduced. The estimated ejection fraction was in the range of 20% to 25%. Diastolic dysfunction with elevated LV Filling pressure. Diffuse hypokinesis. - Mitral valve: Mildly thickened leaflets . There was mild regurgitation. - Left atrium: The atrium was at the upper limits of normal in size. - Right atrium: The atrium was mildly dilated. - Pericardium, extracardiac: Small circumferential pericardial effusion. Tamponade physiology is not suspect, but cannot be ruled-out on the basis of this study.  Impressions:  - Compared to the prior echo in 11/2014, there is now a small circumferential pericardial effusion - no clear tamponade physiology, however, the IVC is dilated. There has been no improvement in LVEF.  ASSESSMENT AND PLAN:  1. Chronic systolic due to ischemic CM - HF EF 25-30% echo 3/16.     --Doing well NYHA I-II. --CPX test reviewed with him and his wife. Mildly improved from previous. Overall looks good.  - Continue Entresto 24/26. Doubt BP will tolerate higher dose.  - Volume status looks good. Continue lasix as needed.  - BP remains soft and frequently dizzy. Cut carvedilol to 6.25 mg twice a day , - Continue sprio 25 mg daily.   2.  CAD s/p Anterior MI December 2015:  --No angina -- Continue  ASA, statin and Effient per Dr. Excell Seltzer Lipids per Dr. Eric Form. Goal LDL < 70 3. Tobacco abuse:  --complete smoking cessation (quit at time of MI). 4. Emphysema - follows with Pulmonary. No change. Spirometry on CPX reviewed personally and only mild disease.  5.  Hypotension - as above. Decrease carvedilol  [name], Daniel,MD 3:06 PM

## 2017-03-19 DIAGNOSIS — Z6825 Body mass index (BMI) 25.0-25.9, adult: Secondary | ICD-10-CM | POA: Diagnosis not present

## 2017-03-19 DIAGNOSIS — R0602 Shortness of breath: Secondary | ICD-10-CM | POA: Diagnosis not present

## 2017-03-19 DIAGNOSIS — R509 Fever, unspecified: Secondary | ICD-10-CM | POA: Diagnosis not present

## 2017-04-02 ENCOUNTER — Other Ambulatory Visit: Payer: Self-pay

## 2017-05-25 ENCOUNTER — Ambulatory Visit (INDEPENDENT_AMBULATORY_CARE_PROVIDER_SITE_OTHER): Payer: PPO | Admitting: *Deleted

## 2017-05-25 DIAGNOSIS — I255 Ischemic cardiomyopathy: Secondary | ICD-10-CM | POA: Diagnosis not present

## 2017-05-25 NOTE — Progress Notes (Signed)
Remote ICD transmission.   

## 2017-05-26 LAB — CUP PACEART REMOTE DEVICE CHECK
Battery Remaining Longevity: 88 mo
Battery Voltage: 3.04 V
Date Time Interrogation Session: 20180618060015
HIGH POWER IMPEDANCE MEASURED VALUE: 70 Ohm
HighPow Impedance: 70 Ohm
Lead Channel Impedance Value: 390 Ohm
Lead Channel Sensing Intrinsic Amplitude: 11.9 mV
Lead Channel Setting Pacing Amplitude: 2.5 V
MDC IDC LEAD IMPLANT DT: 20160323
MDC IDC LEAD LOCATION: 753860
MDC IDC MSMT BATTERY REMAINING PERCENTAGE: 81 %
MDC IDC MSMT LEADCHNL RV PACING THRESHOLD AMPLITUDE: 0.5 V
MDC IDC MSMT LEADCHNL RV PACING THRESHOLD PULSEWIDTH: 0.5 ms
MDC IDC PG IMPLANT DT: 20160323
MDC IDC SET LEADCHNL RV PACING PULSEWIDTH: 0.5 ms
MDC IDC SET LEADCHNL RV SENSING SENSITIVITY: 0.5 mV
MDC IDC STAT BRADY RV PERCENT PACED: 1 %
Pulse Gen Serial Number: 7255600

## 2017-05-28 ENCOUNTER — Encounter: Payer: Self-pay | Admitting: Cardiology

## 2017-05-30 ENCOUNTER — Other Ambulatory Visit (HOSPITAL_COMMUNITY): Payer: Self-pay | Admitting: Internal Medicine

## 2017-06-02 ENCOUNTER — Other Ambulatory Visit (HOSPITAL_COMMUNITY): Payer: Self-pay | Admitting: Internal Medicine

## 2017-06-02 ENCOUNTER — Other Ambulatory Visit (HOSPITAL_COMMUNITY): Payer: Self-pay | Admitting: Cardiology

## 2017-06-02 MED ORDER — CARVEDILOL 6.25 MG PO TABS: 6.2500 mg | ORAL_TABLET | Freq: Two times a day (BID) | ORAL | 3 refills | Status: DC

## 2017-06-11 DIAGNOSIS — J209 Acute bronchitis, unspecified: Secondary | ICD-10-CM | POA: Diagnosis not present

## 2017-06-12 ENCOUNTER — Encounter: Payer: Self-pay | Admitting: Cardiology

## 2017-06-24 DIAGNOSIS — Z6824 Body mass index (BMI) 24.0-24.9, adult: Secondary | ICD-10-CM | POA: Diagnosis not present

## 2017-06-24 DIAGNOSIS — J209 Acute bronchitis, unspecified: Secondary | ICD-10-CM | POA: Diagnosis not present

## 2017-06-24 DIAGNOSIS — I5022 Chronic systolic (congestive) heart failure: Secondary | ICD-10-CM | POA: Diagnosis not present

## 2017-06-24 DIAGNOSIS — R05 Cough: Secondary | ICD-10-CM | POA: Diagnosis not present

## 2017-07-06 ENCOUNTER — Other Ambulatory Visit (HOSPITAL_COMMUNITY): Payer: Self-pay | Admitting: Internal Medicine

## 2017-07-31 ENCOUNTER — Encounter: Payer: Self-pay | Admitting: Internal Medicine

## 2017-08-06 ENCOUNTER — Telehealth (HOSPITAL_COMMUNITY): Payer: Self-pay | Admitting: Vascular Surgery

## 2017-08-06 NOTE — Telephone Encounter (Signed)
Left pt message to move appt due to db not in office 9/24

## 2017-08-14 ENCOUNTER — Ambulatory Visit (HOSPITAL_COMMUNITY)
Admission: RE | Admit: 2017-08-14 | Discharge: 2017-08-14 | Disposition: A | Discharge status: Home / Self Care | Payer: PPO | Source: Ambulatory Visit | Attending: Internal Medicine | Admitting: Internal Medicine

## 2017-08-14 ENCOUNTER — Ambulatory Visit (HOSPITAL_BASED_OUTPATIENT_CLINIC_OR_DEPARTMENT_OTHER)
Admission: RE | Admit: 2017-08-14 | Discharge: 2017-08-14 | Disposition: A | Discharge status: Home / Self Care | Payer: PPO | Source: Ambulatory Visit | Attending: Internal Medicine | Admitting: Internal Medicine

## 2017-08-14 ENCOUNTER — Encounter (HOSPITAL_COMMUNITY): Payer: Self-pay | Admitting: Internal Medicine

## 2017-08-14 VITALS — BP 118/76 | HR 77 | Wt 203.8 lb

## 2017-08-14 DIAGNOSIS — I959 Hypotension, unspecified: Secondary | ICD-10-CM

## 2017-08-14 DIAGNOSIS — I2510 Atherosclerotic heart disease of native coronary artery without angina pectoris: Secondary | ICD-10-CM

## 2017-08-14 DIAGNOSIS — I5022 Chronic systolic (congestive) heart failure: Secondary | ICD-10-CM

## 2017-08-14 DIAGNOSIS — I255 Ischemic cardiomyopathy: Secondary | ICD-10-CM

## 2017-08-14 DIAGNOSIS — I071 Rheumatic tricuspid insufficiency: Secondary | ICD-10-CM | POA: Diagnosis not present

## 2017-08-14 MED ORDER — PERFLUTREN LIPID MICROSPHERE
1.0000 mL | INTRAVENOUS | Status: AC | PRN
  Administered 2017-08-14: 3 mL via INTRAVENOUS
  Filled 2017-08-14: qty 10

## 2017-08-14 NOTE — Patient Instructions (Signed)
Your physician recommends that you schedule a follow-up appointment in: 4 months  

## 2017-08-14 NOTE — Progress Notes (Signed)
Patient ID: EDKER PUNT, male   DOB: Apr 24, 1951, 65 y.o.   MRN: [id number]    Advanced Heart Failure Clinic Note    History of Present Illness: Kyle Bullock is a 66 y.o. male who is referred to the HF Clinic by Dr. Excell Seltzer. He has h/o CAD and ischemic cardiomyopathy 20-25%.  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    In December 2015, he suffered an acute anterolateral MI complicated by VF arrest in [place]. He underwent drug-eluting stent placement to the LAD and PTCA of the diagonal. EF was 25% and a life vest was placed prior to discharge. Following return to [place], he developed dyspnea and presented to [facility] where he was readmitted. His Brilinta was switched to Effient as it was felt the Brilinta may have been playing a role in his dyspnea. He was also switched from an ACE inhibitor to an ARB. He was seen by electrophysiology with recommendation for ongoing life vest therapy and follow-up echo in 90 days post revascularization. He was then seen in clinic in mid December where he reported pleuritic chest pain. He was readmitted and underwent diagnostic catheterization revealing patency of previously placed stent and area of balloon angioplasty in the diagonal. EF was 20-25% with diffuse hypokinesis and normal pericardial appearance. He did have some hypotension requiring discontinuation of ARB therapy. His beta blocker was also down titrated. He was diagnosed with Dressler's syndrome and was placed on prednisone taper and colchicine.   CT with COPD. Referred to Dr. Kendrick Fries who felt he had mild COPD but no ILD. Given PRN albuterol. Has repeat CT scheduled for 1 year.   He presents today for regular follow up. At last visit, soft BP and occasional dizziness prevented med up-titration. Overall feeling OK. Still having occasional lightheadedness. If he has been seated for a few hours, initially limited on standing. Not marked or limiting. Continues to work full time.  Continues to walking around 2 miles most days a week. Minimal SOB on hills. Weight at home 200-202 and stable. Taking all medications as directed. Watching salt and fluid. Not checking BP regularly at home. Taking lasix 20 mg daily. Hasn't needed any extra.    Echo today shows LVEF remains stable ~20-25%. RV normal. Personally reviewed by Dr. Gala Romney.   Echo 3/16 EF 25-30% Echo 03/2016 EF 20-25%  CPX 2/18 FVC 3.49 (72%)    FEV1 2.49 (68%)     FEV1/FVC 71 (93%)     MVV 117 (82%) Resting HR: 62 Peak HR: 148  (97% age predicted max HR) BP rest: 84/60 BP peak: 156/62 Peak VO2: 19.6 (69% predicted peak VO2) VE/VCO2 slope: 37 OUES: 2.14 Peak RER: 1.06 Ventilatory Threshold: 17.4 (61% predicted or measured peak VO2) VE/MVV: 66% O2pulse: 12  (71% predicted O2pulse)  CPX 05/14/2015  Peak VO2: 19.0 (64.5% predicted peak VO2) VE/VCO2 slope: 41.5 OUES: 2.01 Peak RER: 1.06 Ventilatory Threshold: 14.1     (47.9% predicted or measured peak VO2)   Past Medical History:  Diagnosis Date  . Abnormal TSH    a. 11/2014: felt d/t sick euthyroid.  Marland Kitchen AICD (automatic cardioverter/defibrillator) present 02/28/2015  . CAD (coronary artery disease)    a. 11/2014: arrest/STEMI s/p Xience DES to the LAD and PTCA to the D1 [place], [place]). b. Relook cath 11/2014: no culprit, patent stent.  . CHF (congestive heart failure) (HCC)   . Dressler's syndrome (HCC)    a. 11/2014: tx with colchicine and prednisone.  Marland Kitchen  Dyslipidemia   . Dyspnea    a. 11/2014: possibly due to combo of CHF and Brilinta. Brilinta changed to Effient.  . Erectile dysfunction    a. Pt aware not to take ED med within 24 hr of NTG and vice versa.  . Hypertension   . Ischemic cardiomyopathy    a. 11/2014: EF reportedly 20% by cath and echo in [place].  Marland Kitchen. NSVT (nonsustained ventricular tachycardia) (HCC) 11/16/14   a. 11/2014: admitted after discharge from STEMI admission, 18 beats 135-140bpm in ED.  Marland Kitchen. Pericardial  effusion    a. 11/2014.  Marland Kitchen. Pericarditis dx'd 11/2014  . Pneumonia 1990's X 2  . RBBB   . Sleep apnea    suspected but not diagnosed (02/28/2015)  . STEMI (ST elevation myocardial infarction) (HCC) 11/10/2014  . Tobacco abuse   . VF (ventricular fibrillation) (HCC) 11/10/14   a. 11/2014: arrest with STEMI.    Past Surgical History:  Procedure Laterality Date  . CARDIAC CATHETERIZATION  11/19/2014  . CARDIAC DEFIBRILLATOR PLACEMENT  02/28/2015  . CORONARY ANGIOPLASTY WITH STENT PLACEMENT  11/10/2014   LAD DES  . HAND SURGERY Bilateral    Dupuytren's Contracture  . IMPLANTABLE CARDIOVERTER DEFIBRILLATOR IMPLANT N/A 02/28/2015   SJM Fortify Assura VR ICD implanted by Dr Johney FrameAllred.  Revascularized following VF arrest, EF remained depressed and ICD implanted  . KNEE ARTHROSCOPY Right 1982  . LEFT HEART CATH N/A 11/19/2014   Procedure: LEFT HEART CATH;  Surgeon: Runell GessJonathan J Berry, MD;  [facility];  Service: Cardiovascular;  Laterality: N/A;    Current Outpatient Prescriptions  Medication Sig Dispense Refill  . acetaminophen (TYLENOL) 500 MG tablet Take 500 mg by mouth 2 (two) times daily as needed.    Marland Kitchen. aspirin EC 81 MG tablet Take 81 mg by mouth daily.    Marland Kitchen. atorvastatin (LIPITOR) 20 MG tablet TAKE 1 TABLET BY MOUTH DAILY AT 6 PM 90 tablet 3  . carvedilol (COREG) 6.25 MG tablet Take 1 tablet (6.25 mg total) by mouth 2 (two) times daily with a meal. 180 tablet 3  . clopidogrel (PLAVIX) 75 MG tablet TAKE 1 TABLET(75 MG) BY MOUTH DAILY 90 tablet 3  . furosemide (LASIX) 20 MG tablet Take 20 mg by mouth daily.    Marland Kitchen. guaiFENesin (MUCINEX) 600 MG 12 hr tablet Take 600 mg by mouth 2 (two) times daily as needed.    . nitroGLYCERIN (NITROSTAT) 0.4 MG SL tablet Place 0.4 mg under the tongue every 5 (five) minutes as needed for chest pain. Reported on 12/13/2015    . sacubitril-valsartan (ENTRESTO) 24-26 MG Take 1 tablet by mouth 2 (two) times daily. 180 tablet 2  . spironolactone (ALDACTONE) 25  MG tablet Take 1 tablet (25 mg total) by mouth daily. 90 tablet 3   No current facility-administered medications for this encounter.    Facility-Administered Medications Ordered in Other Encounters  Medication Dose Route Frequency Provider Last Rate Last Dose  . perflutren lipid microspheres (DEFINITY) IV suspension  1-10 mL Intravenous PRN Croitoru, Mihai, MD   3 mL at 08/14/17 1100    Allergies:   Ambien [zolpidem tartrate]; Lisinopril; and Brilinta [ticagrelor]   Social History:  The patient  reports that he quit smoking about 2 years ago. His smoking use included Cigarettes. He has a 80.00 pack-year smoking history. He has never used smokeless tobacco. He reports that he drinks about 8.4 oz of alcohol per week . He reports that he does not use drugs.   Family  History:  The patient's  family history includes COPD in his mother; Hypertension in his father and mother.  Review of systems complete and found to be negative unless listed in HPI.    Vitals:   08/14/17 1121  BP: 118/76  Pulse: 77  SpO2: 97%  Weight: 203 lb 12.8 oz (92.4 kg)    Wt Readings from Last 3 Encounters:  08/14/17 203 lb 12.8 oz (92.4 kg)  03/12/17 207 lb (93.9 kg)  11/13/16 201 lb (91.2 kg)   Physical exam: General: Well appearing. No resp difficulty. HEENT: Normal Neck: Supple. JVP 5-6. Carotids 2+ bilat; no bruits. No thyromegaly or nodule noted. Cor: PMI nondisplaced. RRR, No M/G/R noted Lungs: CTAB, normal effort. Abdomen: Soft, non-tender, non-distended, no HSM. No bruits or masses. +BS  Extremities: No cyanosis, clubbing, rash, R and LLE no edema.  Neuro: Alert & orientedx3, cranial nerves grossly intact. moves all 4 extremities w/o difficulty. Affect pleasant   Recent Labs: No results found for requested labs within last 8760 hours.   Lipid Panel     Component Value Date/Time   CHOL 108 01/08/2015 0850   CHOL 101 12/04/2014 1030   TRIG 155.0 (H) 01/08/2015 0850   HDL 39.00 (L) 01/08/2015  1610   HDL 43 12/04/2014 1030   CHOLHDL 3 01/08/2015 0850   VLDL 31.0 01/08/2015 0850   LDLCALC 38 01/08/2015 0850   LDLCALC 39 12/04/2014 1030      Wt Readings from Last 3 Encounters:  08/14/17 203 lb 12.8 oz (92.4 kg)  03/12/17 207 lb (93.9 kg)  11/13/16 201 lb (91.2 kg)     Cardiac Studies Reviewed: 2D Echo 11/13/2014: Study Conclusions  - Left ventricle: The cavity size was normal. Wall thickness was normal. Systolic function was severely reduced. The estimated ejection fraction was in the range of 20% to 25%. Diastolic dysfunction with elevated LV Filling pressure. Diffuse hypokinesis. - Mitral valve: Mildly thickened leaflets . There was mild regurgitation. - Left atrium: The atrium was at the upper limits of normal in size. - Right atrium: The atrium was mildly dilated. - Pericardium, extracardiac: Small circumferential pericardial effusion. Tamponade physiology is not suspect, but cannot be ruled-out on the basis of this study.  Impressions:  - Compared to the prior echo in 11/2014, there is now a small circumferential pericardial effusion - no clear tamponade physiology, however, the IVC is dilated. There has been no improvement in LVEF.  ASSESSMENT AND PLAN:  1. Chronic systolic due to ischemic CM  - Echo 03/2016 LVEF 20-25%, Grade 2 DD - Echo today shows with stable LVEF 20-25%. Normal RV. Images reviewed personally by Dr. Gala Romney.  --NYHA class I-II symptoms.  - Volume status stable on lasix 20 mg daily.   --CPX test previously reviewed with him and his wife.  - Continue Entresto 24/26.  Up-titration previously limited by hyptension.  - Continue carvedilol 6.25 mg BID.  - Continue sprio 25 mg daily.   - Reinforced fluid restriction to < 2 L daily, sodium restriction to less than 2000 mg daily, and the importance of daily weights.   2.  CAD s/p Anterior MI December 2015:  - No s/s ACS.  - Continue ASA, statin and Effient Dr. Excell Seltzer  -  Lipids per Dr. Eric Form. Goal LDL < 70 3. Tobacco abuse:  - Congratulated on continued smoking cessation.  4. Emphysema - Sees pulmonary. Spirometry on CPX previously reviewed with only mild disease.  5. Hypotension - As above. He has not  tolerated up-titration of meds in the past. Continue current medications.   Doing well overall. Labs today. RTC 4 months.  Graciella Freer, PA-C  11:25 AM  Patient seen and examined with the above-signed Advanced Practice Provider and/or Housestaff. I personally reviewed laboratory data, imaging studies and relevant notes. I independently examined the patient and formulated the important aspects of the plan. I have edited the note to reflect any of my changes or salient points. I have personally discussed the plan with the patient and/or family.  He is doing well. NYHA II. Volume status stable. No s/s ischemia. Low BP has prevented med titration. ICD interrogated personally. No VT/AF. Echo reviewed with him in clinic. LVEF 20-25%. RV normal. Would be VAD candidate in future if he required it.   Arvilla Meres, MD  8:18 PM

## 2017-08-14 NOTE — Progress Notes (Signed)
  Echocardiogram 2D Echocardiogram has been performed.  Kyle Bullock, [name] 08/14/2017, 12:25 PM

## 2017-08-15 LAB — ECHOCARDIOGRAM COMPLETE: WEIGHTICAEL: 3260.8 [oz_av]

## 2017-08-20 ENCOUNTER — Ambulatory Visit (INDEPENDENT_AMBULATORY_CARE_PROVIDER_SITE_OTHER): Payer: PPO | Admitting: Internal Medicine

## 2017-08-20 ENCOUNTER — Encounter: Payer: Self-pay | Admitting: Internal Medicine

## 2017-08-20 VITALS — BP 102/58 | HR 71 | Ht 75.0 in | Wt 203.6 lb

## 2017-08-20 DIAGNOSIS — I255 Ischemic cardiomyopathy: Secondary | ICD-10-CM | POA: Diagnosis not present

## 2017-08-20 DIAGNOSIS — I4901 Ventricular fibrillation: Secondary | ICD-10-CM | POA: Diagnosis not present

## 2017-08-20 DIAGNOSIS — I5022 Chronic systolic (congestive) heart failure: Secondary | ICD-10-CM | POA: Diagnosis not present

## 2017-08-20 DIAGNOSIS — Z95810 Presence of automatic (implantable) cardiac defibrillator: Secondary | ICD-10-CM

## 2017-08-20 LAB — CUP PACEART INCLINIC DEVICE CHECK
Battery Remaining Longevity: 87 mo
Brady Statistic RV Percent Paced: 0.54 %
HIGH POWER IMPEDANCE MEASURED VALUE: 69.75 Ohm
Implantable Lead Location: 753860
Lead Channel Impedance Value: 387.5 Ohm
Lead Channel Pacing Threshold Pulse Width: 0.5 ms
Lead Channel Sensing Intrinsic Amplitude: 11.9 mV
Lead Channel Setting Pacing Amplitude: 2.5 V
Lead Channel Setting Sensing Sensitivity: 0.5 mV
MDC IDC LEAD IMPLANT DT: 20160323
MDC IDC MSMT LEADCHNL RV PACING THRESHOLD AMPLITUDE: 0.75 V
MDC IDC PG IMPLANT DT: 20160323
MDC IDC SESS DTM: 20180913154232
MDC IDC SET LEADCHNL RV PACING PULSEWIDTH: 0.5 ms
Pulse Gen Serial Number: 7255600

## 2017-08-20 NOTE — Patient Instructions (Signed)
Medication Instructions:  Your physician recommends that you continue on your current medications as directed. Please refer to the Current Medication list given to you today.   Labwork: None ordered   Testing/Procedures: None ordered   Follow-Up: Your physician wants you to follow-up in: 12 months with Dr Johney FrameAllred Bonita QuinYou will receive a reminder letter in the mail two months in advance. If you don't receive a letter, please call our office to schedule the follow-up appointment.  Remote monitoring is used to monitor your ICD from home. This monitoring reduces the number of office visits required to check your device to one time per year. It allows us to keep an eye on the functioning of your device to ensure it is working properly. You are scheduled for a device check from home on 11/19/17. You may send your transmission at any time that day. If you have a wireless device, the transmission will be sent automatically. After your physician reviews your transmission, you will receive a postcard with your next transmission date.    Any Other Special Instructions Will Be Listed Below (If Applicable).     If you need a refill on your cardiac medications before your next appointment, please call your pharmacy.

## 2017-08-20 NOTE — Progress Notes (Signed)
PCP: Martha ClanShaw, William, MD CHF:  [name] Primary EP: Dr Ronald LoboAllred  Kyle Bullock is a 66 y.o. male who presents today for routine electrophysiology followup.  Since last being seen in our clinic, the patient reports doing very well.  Today, he denies symptoms of palpitations, chest pain, shortness of breath,  lower extremity edema, dizziness, presyncope, syncope, or ICD shocks.  The patient is otherwise without complaint today.   Past Medical History:  Diagnosis Date  . Abnormal TSH    a. 11/2014: felt d/t sick euthyroid.  Marland Kitchen. AICD (automatic cardioverter/defibrillator) present 02/28/2015  . CAD (coronary artery disease)    a. 11/2014: arrest/STEMI s/p Xience DES to the LAD and PTCA to the D1 [place] b. Relook cath 11/2014: no culprit, patent stent.  . CHF (congestive heart failure) (HCC)   . Dressler's syndrome (HCC)    a. 11/2014: tx with colchicine and prednisone.  . Dyslipidemia   . Dyspnea    a. 11/2014: possibly due to combo of CHF and Brilinta. Brilinta changed to Effient.  . Erectile dysfunction    a. Pt aware not to take ED med within 24 hr of NTG and vice versa.  . Hypertension   . Ischemic cardiomyopathy    a. 11/2014: EF reportedly 20% by cath and echo in [place].  Marland Kitchen. NSVT (nonsustained ventricular tachycardia) (HCC) 11/16/14   a. 11/2014: admitted after discharge from STEMI admission, 18 beats 135-140bpm in ED.  Marland Kitchen. Pericardial effusion    a. 11/2014.  Marland Kitchen. Pericarditis dx'd 11/2014  . Pneumonia 1990's X 2  . RBBB   . Sleep apnea    suspected but not diagnosed (02/28/2015)  . STEMI (ST elevation myocardial infarction) (HCC) 11/10/2014  . Tobacco abuse   . VF (ventricular fibrillation) (HCC) 11/10/14   a. 11/2014: arrest with STEMI.   Past Surgical History:  Procedure Laterality Date  . CARDIAC CATHETERIZATION  11/19/2014  . CARDIAC DEFIBRILLATOR PLACEMENT  02/28/2015  . CORONARY ANGIOPLASTY WITH STENT PLACEMENT  11/10/2014   LAD DES  . HAND SURGERY Bilateral    Dupuytren's Contracture  . IMPLANTABLE CARDIOVERTER DEFIBRILLATOR IMPLANT N/A 02/28/2015   SJM Fortify Assura VR ICD implanted by Dr Johney FrameAllred.  Revascularized following VF arrest, EF remained depressed and ICD implanted  . KNEE ARTHROSCOPY Right 1982  . LEFT HEART CATH N/A 11/19/2014   Procedure: LEFT HEART CATH;  Surgeon: Runell GessJonathan J Berry, MD;  [facility];  Service: Cardiovascular;  Laterality: N/A;    ROS- all systems are reviewed and negative except as per HPI above  Current Outpatient Prescriptions  Medication Sig Dispense Refill  . acetaminophen (TYLENOL) 500 MG tablet Take 500 mg by mouth 2 (two) times daily as needed (pain).     Marland Kitchen. aspirin EC 81 MG tablet Take 81 mg by mouth daily.    Marland Kitchen. atorvastatin (LIPITOR) 20 MG tablet TAKE 1 TABLET BY MOUTH DAILY AT 6 PM 90 tablet 3  . carvedilol (COREG) 6.25 MG tablet Take 1 tablet (6.25 mg total) by mouth 2 (two) times daily with a meal. 180 tablet 3  . clopidogrel (PLAVIX) 75 MG tablet TAKE 1 TABLET(75 MG) BY MOUTH DAILY 90 tablet 3  . furosemide (LASIX) 20 MG tablet Take 20 mg by mouth daily.    Marland Kitchen. guaiFENesin (MUCINEX) 600 MG 12 hr tablet Take 600 mg by mouth 2 (two) times daily as needed (AS DIRECTED).     . nitroGLYCERIN (NITROSTAT) 0.4 MG SL tablet Place 0.4 mg under the tongue every  5 (five) minutes as needed for chest pain (MAX 3 TABLETS). Reported on 12/13/2015    . sacubitril-valsartan (ENTRESTO) 24-26 MG Take 1 tablet by mouth 2 (two) times daily. 180 tablet 2  . spironolactone (ALDACTONE) 25 MG tablet Take 1 tablet (25 mg total) by mouth daily. 90 tablet 3   No current facility-administered medications for this visit.     Physical Exam: Vitals:   08/20/17 1457  BP: (!) 102/58  Pulse: 71  SpO2: 98%  Weight: 203 lb 9.6 oz (92.4 kg)  Height:  (1.905 m)    GEN- The patient is well appearing, alert and oriented x 3 today.   Head- normocephalic, atraumatic Eyes-  Sclera clear, conjunctiva pink Ears- hearing  intact Oropharynx- clear Lungs- Clear to ausculation bilaterally, normal work of breathing Chest- ICD pocket is well healed Heart- Regular rate and rhythm, no murmurs, rubs or gallops, PMI not laterally displaced GI- soft, NT, ND, + BS Extremities- no clubbing, cyanosis, or edema  ICD interrogation- reviewed in detail today,  See PACEART report  ekg tracing ordered today is personally reviewed and shows sinus rhythm 60 bpm, RBBB  Assessment and Plan:  1.  Chronic systolic dysfunction/ ischemic CM/ CAD s/p prior VF arrest euvolemic today Stable on an appropriate medical regimen Normal ICD function See Pace Art report No changes today Not interested in [facility] clinic at this time   Merlin Return to see me in a year  Hillis Range MD, [facility] 08/20/2017 3:09 PM

## 2017-08-24 ENCOUNTER — Other Ambulatory Visit (HOSPITAL_COMMUNITY): Payer: Self-pay | Admitting: Internal Medicine

## 2017-08-31 ENCOUNTER — Other Ambulatory Visit (HOSPITAL_COMMUNITY): Payer: PPO

## 2017-08-31 ENCOUNTER — Encounter (HOSPITAL_COMMUNITY): Payer: PPO | Admitting: Internal Medicine

## 2017-10-16 ENCOUNTER — Other Ambulatory Visit: Payer: Self-pay | Admitting: Internal Medicine

## 2017-10-22 DIAGNOSIS — H2513 Age-related nuclear cataract, bilateral: Secondary | ICD-10-CM | POA: Diagnosis not present

## 2017-11-03 ENCOUNTER — Other Ambulatory Visit (HOSPITAL_COMMUNITY): Payer: Self-pay | Admitting: Internal Medicine

## 2017-11-05 DIAGNOSIS — R05 Cough: Secondary | ICD-10-CM | POA: Diagnosis not present

## 2017-11-05 DIAGNOSIS — R0602 Shortness of breath: Secondary | ICD-10-CM | POA: Diagnosis not present

## 2017-11-05 DIAGNOSIS — H6122 Impacted cerumen, left ear: Secondary | ICD-10-CM | POA: Diagnosis not present

## 2017-11-05 DIAGNOSIS — J069 Acute upper respiratory infection, unspecified: Secondary | ICD-10-CM | POA: Diagnosis not present

## 2017-11-05 DIAGNOSIS — Z6825 Body mass index (BMI) 25.0-25.9, adult: Secondary | ICD-10-CM | POA: Diagnosis not present

## 2017-11-19 ENCOUNTER — Telehealth: Payer: Self-pay | Admitting: Cardiology

## 2017-11-19 ENCOUNTER — Encounter: Payer: PPO | Admitting: *Deleted

## 2017-11-19 NOTE — Telephone Encounter (Signed)
LMOVM reminding pt to send remote transmission.   

## 2017-11-20 ENCOUNTER — Encounter: Payer: Self-pay | Admitting: Cardiology

## 2017-11-24 ENCOUNTER — Other Ambulatory Visit (HOSPITAL_COMMUNITY): Payer: Self-pay | Admitting: Internal Medicine

## 2017-12-15 DIAGNOSIS — E038 Other specified hypothyroidism: Secondary | ICD-10-CM | POA: Diagnosis not present

## 2017-12-15 DIAGNOSIS — E7849 Other hyperlipidemia: Secondary | ICD-10-CM | POA: Diagnosis not present

## 2017-12-15 DIAGNOSIS — Z125 Encounter for screening for malignant neoplasm of prostate: Secondary | ICD-10-CM | POA: Diagnosis not present

## 2017-12-15 DIAGNOSIS — I10 Essential (primary) hypertension: Secondary | ICD-10-CM | POA: Diagnosis not present

## 2017-12-22 DIAGNOSIS — E7849 Other hyperlipidemia: Secondary | ICD-10-CM | POA: Diagnosis not present

## 2017-12-22 DIAGNOSIS — E038 Other specified hypothyroidism: Secondary | ICD-10-CM | POA: Diagnosis not present

## 2017-12-22 DIAGNOSIS — Z0000 Encounter for general adult medical examination without abnormal findings: Secondary | ICD-10-CM | POA: Diagnosis not present

## 2017-12-22 DIAGNOSIS — I255 Ischemic cardiomyopathy: Secondary | ICD-10-CM | POA: Diagnosis not present

## 2017-12-22 DIAGNOSIS — I5022 Chronic systolic (congestive) heart failure: Secondary | ICD-10-CM | POA: Diagnosis not present

## 2017-12-22 DIAGNOSIS — I2789 Other specified pulmonary heart diseases: Secondary | ICD-10-CM | POA: Diagnosis not present

## 2017-12-22 DIAGNOSIS — Z1389 Encounter for screening for other disorder: Secondary | ICD-10-CM | POA: Diagnosis not present

## 2017-12-22 DIAGNOSIS — J432 Centrilobular emphysema: Secondary | ICD-10-CM | POA: Diagnosis not present

## 2017-12-22 DIAGNOSIS — I10 Essential (primary) hypertension: Secondary | ICD-10-CM | POA: Diagnosis not present

## 2017-12-22 DIAGNOSIS — Z9861 Coronary angioplasty status: Secondary | ICD-10-CM | POA: Diagnosis not present

## 2017-12-22 DIAGNOSIS — Z6825 Body mass index (BMI) 25.0-25.9, adult: Secondary | ICD-10-CM | POA: Diagnosis not present

## 2017-12-22 DIAGNOSIS — J984 Other disorders of lung: Secondary | ICD-10-CM | POA: Diagnosis not present

## 2017-12-26 ENCOUNTER — Other Ambulatory Visit: Payer: Self-pay | Admitting: Internal Medicine

## 2017-12-26 DIAGNOSIS — R911 Solitary pulmonary nodule: Secondary | ICD-10-CM

## 2017-12-29 ENCOUNTER — Telehealth: Payer: Self-pay

## 2017-12-29 NOTE — Telephone Encounter (Signed)
FAXED NOTES TO CHF

## 2018-01-01 DIAGNOSIS — Z1212 Encounter for screening for malignant neoplasm of rectum: Secondary | ICD-10-CM | POA: Diagnosis not present

## 2018-01-04 ENCOUNTER — Other Ambulatory Visit (HOSPITAL_COMMUNITY): Payer: Self-pay | Admitting: Internal Medicine

## 2018-01-18 ENCOUNTER — Ambulatory Visit
Admission: RE | Admit: 2018-01-18 | Discharge: 2018-01-18 | Disposition: A | Discharge status: Home / Self Care | Payer: PPO | Source: Ambulatory Visit | Attending: Internal Medicine | Admitting: Internal Medicine

## 2018-01-18 DIAGNOSIS — R918 Other nonspecific abnormal finding of lung field: Secondary | ICD-10-CM | POA: Diagnosis not present

## 2018-01-18 DIAGNOSIS — R911 Solitary pulmonary nodule: Secondary | ICD-10-CM

## 2018-01-27 ENCOUNTER — Other Ambulatory Visit (HOSPITAL_COMMUNITY): Payer: Self-pay | Admitting: Internal Medicine

## 2018-01-27 DIAGNOSIS — I4510 Unspecified right bundle-branch block: Secondary | ICD-10-CM

## 2018-01-27 DIAGNOSIS — I5022 Chronic systolic (congestive) heart failure: Secondary | ICD-10-CM

## 2018-01-27 DIAGNOSIS — Z1211 Encounter for screening for malignant neoplasm of colon: Secondary | ICD-10-CM | POA: Diagnosis not present

## 2018-01-27 DIAGNOSIS — Z1212 Encounter for screening for malignant neoplasm of rectum: Secondary | ICD-10-CM | POA: Diagnosis not present

## 2018-01-27 DIAGNOSIS — I255 Ischemic cardiomyopathy: Secondary | ICD-10-CM

## 2018-01-27 DIAGNOSIS — I10 Essential (primary) hypertension: Secondary | ICD-10-CM

## 2018-02-19 ENCOUNTER — Ambulatory Visit (INDEPENDENT_AMBULATORY_CARE_PROVIDER_SITE_OTHER): Payer: PPO | Admitting: *Deleted

## 2018-02-19 DIAGNOSIS — I255 Ischemic cardiomyopathy: Secondary | ICD-10-CM | POA: Diagnosis not present

## 2018-02-19 DIAGNOSIS — I5022 Chronic systolic (congestive) heart failure: Secondary | ICD-10-CM

## 2018-02-19 NOTE — Progress Notes (Signed)
Remote ICD transmission.   

## 2018-02-23 ENCOUNTER — Encounter: Payer: Self-pay | Admitting: Cardiology

## 2018-02-26 ENCOUNTER — Other Ambulatory Visit (HOSPITAL_COMMUNITY): Payer: Self-pay | Admitting: *Deleted

## 2018-02-26 MED ORDER — CLOPIDOGREL BISULFATE 75 MG PO TABS: 75.0000 mg | ORAL_TABLET | Freq: Every day | ORAL | 3 refills | Status: DC

## 2018-03-05 LAB — CUP PACEART REMOTE DEVICE CHECK
Battery Remaining Longevity: 80 mo
Battery Remaining Percentage: 75 %
Battery Voltage: 3.02 V
Brady Statistic RV Percent Paced: 1 %
HIGH POWER IMPEDANCE MEASURED VALUE: 66 Ohm
HighPow Impedance: 66 Ohm
Implantable Lead Implant Date: 20160323
Lead Channel Impedance Value: 360 Ohm
Lead Channel Pacing Threshold Amplitude: 0.75 V
Lead Channel Sensing Intrinsic Amplitude: 11.2 mV
Lead Channel Setting Pacing Amplitude: 2.5 V
Lead Channel Setting Sensing Sensitivity: 0.5 mV
MDC IDC LEAD LOCATION: 753860
MDC IDC MSMT LEADCHNL RV PACING THRESHOLD PULSEWIDTH: 0.5 ms
MDC IDC PG IMPLANT DT: 20160323
MDC IDC PG SERIAL: 7255600
MDC IDC SESS DTM: 20190315041904
MDC IDC SET LEADCHNL RV PACING PULSEWIDTH: 0.5 ms

## 2018-03-10 ENCOUNTER — Ambulatory Visit (HOSPITAL_COMMUNITY)
Admission: RE | Admit: 2018-03-10 | Discharge: 2018-03-10 | Disposition: A | Discharge status: Home / Self Care | Payer: PPO | Source: Ambulatory Visit | Attending: Internal Medicine | Admitting: Internal Medicine

## 2018-03-10 ENCOUNTER — Encounter (HOSPITAL_COMMUNITY): Payer: Self-pay | Admitting: Internal Medicine

## 2018-03-10 VITALS — BP 104/64 | HR 60 | Wt 204.2 lb

## 2018-03-10 DIAGNOSIS — E785 Hyperlipidemia, unspecified: Secondary | ICD-10-CM | POA: Insufficient documentation

## 2018-03-10 DIAGNOSIS — Z7902 Long term (current) use of antithrombotics/antiplatelets: Secondary | ICD-10-CM | POA: Insufficient documentation

## 2018-03-10 DIAGNOSIS — I255 Ischemic cardiomyopathy: Secondary | ICD-10-CM | POA: Diagnosis not present

## 2018-03-10 DIAGNOSIS — I110 Hypertensive heart disease with heart failure: Secondary | ICD-10-CM | POA: Diagnosis not present

## 2018-03-10 DIAGNOSIS — Z95810 Presence of automatic (implantable) cardiac defibrillator: Secondary | ICD-10-CM | POA: Diagnosis not present

## 2018-03-10 DIAGNOSIS — I4510 Unspecified right bundle-branch block: Secondary | ICD-10-CM | POA: Diagnosis not present

## 2018-03-10 DIAGNOSIS — I2510 Atherosclerotic heart disease of native coronary artery without angina pectoris: Secondary | ICD-10-CM | POA: Diagnosis not present

## 2018-03-10 DIAGNOSIS — Z888 Allergy status to other drugs, medicaments and biological substances status: Secondary | ICD-10-CM | POA: Diagnosis not present

## 2018-03-10 DIAGNOSIS — I5022 Chronic systolic (congestive) heart failure: Secondary | ICD-10-CM

## 2018-03-10 DIAGNOSIS — Z79899 Other long term (current) drug therapy: Secondary | ICD-10-CM | POA: Diagnosis not present

## 2018-03-10 DIAGNOSIS — J449 Chronic obstructive pulmonary disease, unspecified: Secondary | ICD-10-CM | POA: Diagnosis not present

## 2018-03-10 DIAGNOSIS — I252 Old myocardial infarction: Secondary | ICD-10-CM | POA: Insufficient documentation

## 2018-03-10 DIAGNOSIS — Z79890 Hormone replacement therapy (postmenopausal): Secondary | ICD-10-CM | POA: Insufficient documentation

## 2018-03-10 DIAGNOSIS — Z87891 Personal history of nicotine dependence: Secondary | ICD-10-CM | POA: Diagnosis not present

## 2018-03-10 DIAGNOSIS — Z955 Presence of coronary angioplasty implant and graft: Secondary | ICD-10-CM | POA: Insufficient documentation

## 2018-03-10 DIAGNOSIS — Z7982 Long term (current) use of aspirin: Secondary | ICD-10-CM | POA: Diagnosis not present

## 2018-03-10 DIAGNOSIS — I959 Hypotension, unspecified: Secondary | ICD-10-CM | POA: Diagnosis not present

## 2018-03-10 LAB — BASIC METABOLIC PANEL
Anion gap: 9 (ref 5–15)
BUN: 18 mg/dL (ref 6–20)
CO2: 23 mmol/L (ref 22–32)
Calcium: 9.4 mg/dL (ref 8.9–10.3)
Chloride: 105 mmol/L (ref 101–111)
Creatinine, Ser: 1.12 mg/dL (ref 0.61–1.24)
GFR calc Af Amer: >60 mL/min (ref >60)
GFR calc non Af Amer: >60 mL/min (ref >60)
Glucose, Bld: 110 mg/dL — ABNORMAL HIGH (ref 65–99)
Potassium: 4.9 mmol/L (ref 3.5–5.1)
Sodium: 137 mmol/L (ref 135–145)

## 2018-03-10 NOTE — Patient Instructions (Signed)
Routine lab work today. Will notify you of abnormal results  Follow up with Dr.[name] in 6 months.  ** Please all our office at [phone] in September for October appointment**

## 2018-03-10 NOTE — Progress Notes (Signed)
Patient ID: Kyle Bullock, male   DOB: Apr 03, 1951, 67 y.o.   MRN: [id number]    Advanced Heart Failure Clinic Note    History of Present Illness: Kyle Bullock is a 67 y.o. male who is referred to the HF Clinic by Dr. Excell Seltzer. He has h/o CAD and ischemic cardiomyopathy 20-25%.  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    In December 2015, he suffered an acute anterolateral MI complicated by VF arrest in [place]. He underwent drug-eluting stent placement to the LAD and PTCA of the diagonal. EF was 25% and a life vest was placed prior to discharge. Following return to [place], he developed dyspnea and presented to [facility] where he was readmitted. His Brilinta was switched to Effient as it was felt the Brilinta may have been playing a role in his dyspnea. He was also switched from an ACE inhibitor to an ARB. He was seen by electrophysiology with recommendation for ongoing life vest therapy and follow-up echo in 90 days post revascularization. He was then seen in clinic in mid December where he reported pleuritic chest pain. He was readmitted and underwent diagnostic catheterization revealing patency of previously placed stent and area of balloon angioplasty in the diagonal. EF was 20-25% with diffuse hypokinesis and normal pericardial appearance. He did have some hypotension requiring discontinuation of ARB therapy. His beta blocker was also down titrated. He was diagnosed with Dressler's syndrome and was placed on prednisone taper and colchicine.   CT with COPD. Referred to Dr. Kendrick Fries who felt he had mild COPD but no ILD. Given PRN albuterol. Has repeat CT scheduled for 1 year.   He presents today for regular follow up. Both him and his wife are about to retire in next few months. SBP remains low with occasional dizzy spells. Usually SBP in 90s. No edema, orthopnea or PND. No CP. Weight stable 200-202. Walking 2-2.5 miles 5+ days per week. Taking all meds as directed.   ICD  interrogated personally No VT. Volume up and downs lightly but overall well controlled. Personally reviewed    Echo 3/16 EF 25-30% Echo 03/2016 EF 20-25% Echo 9/18 EF 20-25% RV normal   CPX 2/18 FVC 3.49 (72%)    FEV1 2.49 (68%)     FEV1/FVC 71 (93%)     MVV 117 (82%) Resting HR: 62 Peak HR: 148  (97% age predicted max HR) BP rest: 84/60 BP peak: 156/62 Peak VO2: 19.6 (69% predicted peak VO2) VE/VCO2 slope: 37 OUES: 2.14 Peak RER: 1.06 Ventilatory Threshold: 17.4 (61% predicted or measured peak VO2) VE/MVV: 66% O2pulse: 12  (71% predicted O2pulse)  CPX 05/14/2015  Peak VO2: 19.0 (64.5% predicted peak VO2) VE/VCO2 slope: 41.5 OUES: 2.01 Peak RER: 1.06 Ventilatory Threshold: 14.1     (47.9% predicted or measured peak VO2)   Past Medical History:  Diagnosis Date  . Abnormal TSH    a. 11/2014: felt d/t sick euthyroid.  Marland Kitchen AICD (automatic cardioverter/defibrillator) present 02/28/2015  . CAD (coronary artery disease)    a. 11/2014: arrest/STEMI s/p Xience DES to the LAD and PTCA to the D1 [place], [place]). b. Relook cath 11/2014: no culprit, patent stent.  . CHF (congestive heart failure) (HCC)   . Dressler's syndrome (HCC)    a. 11/2014: tx with colchicine and prednisone.  . Dyslipidemia   . Dyspnea    a. 11/2014: possibly due to combo of CHF and Brilinta. Brilinta changed to Effient.  . Erectile dysfunction  a. Pt aware not to take ED med within 24 hr of NTG and vice versa.  . Hypertension   . Ischemic cardiomyopathy    a. 11/2014: EF reportedly 20% by cath and echo in [place].  Marland Kitchen NSVT (nonsustained ventricular tachycardia) (HCC) 11/16/14   a. 11/2014: admitted after discharge from STEMI admission, 18 beats 135-140bpm in ED.  Marland Kitchen Pericardial effusion    a. 11/2014.  Marland Kitchen Pericarditis dx'd 11/2014  . Pneumonia 1990's X 2  . RBBB   . Sleep apnea    suspected but not diagnosed (02/28/2015)  . STEMI (ST elevation myocardial infarction) (HCC) 11/10/2014  .  Tobacco abuse   . VF (ventricular fibrillation) (HCC) 11/10/14   a. 11/2014: arrest with STEMI.    Past Surgical History:  Procedure Laterality Date  . CARDIAC CATHETERIZATION  11/19/2014  . CARDIAC DEFIBRILLATOR PLACEMENT  02/28/2015  . CORONARY ANGIOPLASTY WITH STENT PLACEMENT  11/10/2014   LAD DES  . HAND SURGERY Bilateral    Dupuytren's Contracture  . IMPLANTABLE CARDIOVERTER DEFIBRILLATOR IMPLANT N/A 02/28/2015   SJM Fortify Assura VR ICD implanted by Dr Johney Frame.  Revascularized following VF arrest, EF remained depressed and ICD implanted  . KNEE ARTHROSCOPY Right 1982  . LEFT HEART CATH N/A 11/19/2014   Procedure: LEFT HEART CATH;  Surgeon: Runell Gess, MD;  [facility] C[facility] LAB;  Service: Cardiovascular;  Laterality: N/A;    Current Outpatient Medications  Medication Sig Dispense Refill  . acetaminophen (TYLENOL) 500 MG tablet Take 500 mg by mouth 2 (two) times daily as needed (pain).     Marland Kitchen aspirin EC 81 MG tablet Take 81 mg by mouth daily.    Marland Kitchen atorvastatin (LIPITOR) 20 MG tablet TAKE 1 TABLET BY MOUTH DAILY AT 6 PM 90 tablet 3  . carvedilol (COREG) 6.25 MG tablet Take 1 tablet (6.25 mg total) by mouth 2 (two) times daily with a meal. 180 tablet 3  . carvedilol (COREG) 6.25 MG tablet Take 1 tablet (6.25 mg total) by mouth 2 (two) times daily with a meal. Needs OV for further refills 180 tablet 0  . clopidogrel (PLAVIX) 75 MG tablet Take 1 tablet (75 mg total) by mouth daily. 90 tablet 3  . ENTRESTO 24-26 MG TAKE 1 TABLET BY MOUTH TWICE DAILY 180 tablet 3  . furosemide (LASIX) 40 MG tablet Take 40 mg by mouth.    Marland Kitchen guaiFENesin (MUCINEX) 600 MG 12 hr tablet Take 600 mg by mouth 2 (two) times daily as needed (AS DIRECTED).     Marland Kitchen levothyroxine (SYNTHROID, LEVOTHROID) 50 MCG tablet Take 50 mcg by mouth daily before breakfast.    . nitroGLYCERIN (NITROSTAT) 0.4 MG SL tablet Place 0.4 mg under the tongue every 5 (five) minutes as needed for chest pain (MAX 3 TABLETS). Reported on  12/13/2015    . spironolactone (ALDACTONE) 25 MG tablet TAKE 1 TABLET(25 MG) BY MOUTH DAILY 90 tablet 0   No current facility-administered medications for this encounter.     Allergies:   Ambien [zolpidem tartrate]; Lisinopril; and Brilinta [ticagrelor]   Social History:  The patient  reports that he quit smoking about 3 years ago. His smoking use included cigarettes. He has a 80.00 pack-year smoking history. He has never used smokeless tobacco. He reports that he drinks about 8.4 oz of alcohol per week. He reports that he does not use drugs.   Family History:  The patient's  family history includes COPD in his mother; Hypertension in his father and mother.  Review of systems complete and found to be negative unless listed in HPI.    Vitals:   03/10/18 1124  BP: 104/64  Pulse: 60  SpO2: 98%  Weight: 204 lb 4 oz (92.6 kg)    Wt Readings from Last 3 Encounters:  03/10/18 204 lb 4 oz (92.6 kg)  08/20/17 203 lb 9.6 oz (92.4 kg)  08/14/17 203 lb 12.8 oz (92.4 kg)   Physical exam: General:  Well appearing. No resp difficulty HEENT: normal Neck: supple. no JVD. Carotids 2+ bilat; no bruits. No lymphadenopathy or thryomegaly appreciated. Cor: PMI laterally displaced. Regular rate & rhythm. No rubs, gallops or murmurs. Lungs: clear Abdomen: soft, nontender, nondistended. No hepatosplenomegaly. No bruits or masses. Good bowel sounds. Extremities: no cyanosis, clubbing, rash, edema Neuro: alert & orientedx3, cranial nerves grossly intact. moves all 4 extremities w/o difficulty. Affect pleasant  Recent Labs: No results found for requested labs within last 8760 hours.   Lipid Panel     Component Value Date/Time   CHOL 108 01/08/2015 0850   CHOL 101 12/04/2014 1030   TRIG 155.0 (H) 01/08/2015 0850   HDL 39.00 (L) 01/08/2015 0850   HDL 43 12/04/2014 1030   CHOLHDL 3 01/08/2015 0850   VLDL 31.0 01/08/2015 0850   LDLCALC 38 01/08/2015 0850   LDLCALC 39 12/04/2014 1030      Wt  Readings from Last 3 Encounters:  03/10/18 204 lb 4 oz (92.6 kg)  08/20/17 203 lb 9.6 oz (92.4 kg)  08/14/17 203 lb 12.8 oz (92.4 kg)     ASSESSMENT AND PLAN:  1. Chronic systolic due to ischemic CM  - Echo 03/2016 LVEF 20-25%, Grade 2 DD - Echo 3/18 shows with stable LVEF 20-25%. Normal RV. Images reviewed personally by Dr. Gala RomneyBensimhon.  - stable NYHA class I-II symptoms.  - Volume status stable on lasix 20 mg daily.   --CPX test previously reviewed with him and his wife.  - Continue Entresto 24/26.  Up-titration previously limited by hypotension.  - Continue carvedilol 6.25 mg BID.  - Continue sprio 25 mg daily.   - ICD interrogated personally No VT. Volume ok  - Reinforced fluid restriction to < 2 L daily, sodium restriction to less than 2000 mg daily, and the importance of daily weights.   2.  CAD s/p Anterior MI December 2015:  - No s/s of chest pain  - Continue ASA, statin and Effient Dr. Excell Seltzerooper  - Lipids per Dr. Eric Formoug Shaw. Goal LDL < 70 3. Tobacco abuse:  - Congratulated on continued smoking cessation.  4. Emphysema - Sees pulmonary. Spirometry on CPX previously reviewed with only mild disease.  5. Hypotension - As above. He has not tolerated up-titration of meds in the past. Continue current medications.   Labs today. F/u 6 months.   Kyle Meresaniel Sehaj Mcenroe, MD  11:51 AM  Patient seen and examined with the above-signed Advanced Practice Provider and/or Housestaff. I personally reviewed laboratory data, imaging studies and relevant notes. I independently examined the patient and formulated the important aspects of the plan. I have edited the note to reflect any of my changes or salient points. I have personally discussed the plan with the patient and/or family.  He is doing well. NYHA II. Volume status stable. No s/s ischemia. Low BP has prevented med titration. ICD interrogated personally. No VT/AF. Echo reviewed with him in clinic. LVEF 20-25%. RV normal. Would be VAD candidate in  future if he required it.   Kyle Meresaniel Alinna Siple, MD  11:51  AM    

## 2018-03-10 NOTE — Addendum Note (Signed)
Encounter addended by: Modesta MessingBrown, Refugio Mcconico Q, CMA on: 03/10/2018 12:12 PM  Actions taken: Order list changed, Diagnosis association updated, Sign clinical note

## 2018-03-29 DIAGNOSIS — N5201 Erectile dysfunction due to arterial insufficiency: Secondary | ICD-10-CM | POA: Diagnosis not present

## 2018-03-29 DIAGNOSIS — N402 Nodular prostate without lower urinary tract symptoms: Secondary | ICD-10-CM | POA: Diagnosis not present

## 2018-04-06 ENCOUNTER — Other Ambulatory Visit (HOSPITAL_COMMUNITY): Payer: Self-pay | Admitting: Internal Medicine

## 2018-04-09 DIAGNOSIS — M109 Gout, unspecified: Secondary | ICD-10-CM | POA: Diagnosis not present

## 2018-04-09 DIAGNOSIS — Z6825 Body mass index (BMI) 25.0-25.9, adult: Secondary | ICD-10-CM | POA: Diagnosis not present

## 2018-04-13 ENCOUNTER — Other Ambulatory Visit: Payer: Self-pay | Admitting: Internal Medicine

## 2018-05-04 ENCOUNTER — Other Ambulatory Visit (HOSPITAL_COMMUNITY): Payer: Self-pay | Admitting: Internal Medicine

## 2018-05-04 DIAGNOSIS — I4510 Unspecified right bundle-branch block: Secondary | ICD-10-CM

## 2018-05-04 DIAGNOSIS — I10 Essential (primary) hypertension: Secondary | ICD-10-CM

## 2018-05-04 DIAGNOSIS — I255 Ischemic cardiomyopathy: Secondary | ICD-10-CM

## 2018-05-04 DIAGNOSIS — I5022 Chronic systolic (congestive) heart failure: Secondary | ICD-10-CM

## 2018-05-24 ENCOUNTER — Encounter: Payer: PPO | Admitting: *Deleted

## 2018-05-24 ENCOUNTER — Telehealth: Payer: Self-pay | Admitting: Cardiology

## 2018-05-24 NOTE — Telephone Encounter (Signed)
LMOVM reminding pt to send remote transmission.   

## 2018-05-26 ENCOUNTER — Encounter: Payer: Self-pay | Admitting: Cardiology

## 2018-06-08 ENCOUNTER — Ambulatory Visit (INDEPENDENT_AMBULATORY_CARE_PROVIDER_SITE_OTHER): Payer: PPO | Admitting: *Deleted

## 2018-06-08 DIAGNOSIS — R05 Cough: Secondary | ICD-10-CM | POA: Diagnosis not present

## 2018-06-08 DIAGNOSIS — I5022 Chronic systolic (congestive) heart failure: Secondary | ICD-10-CM | POA: Diagnosis not present

## 2018-06-08 DIAGNOSIS — J441 Chronic obstructive pulmonary disease with (acute) exacerbation: Secondary | ICD-10-CM | POA: Diagnosis not present

## 2018-06-08 DIAGNOSIS — I255 Ischemic cardiomyopathy: Secondary | ICD-10-CM

## 2018-06-09 ENCOUNTER — Encounter: Payer: Self-pay | Admitting: Cardiology

## 2018-06-09 NOTE — Progress Notes (Signed)
Remote ICD transmission.   

## 2018-06-14 ENCOUNTER — Other Ambulatory Visit (HOSPITAL_COMMUNITY): Payer: Self-pay | Admitting: Respiratory Therapy

## 2018-06-14 DIAGNOSIS — J432 Centrilobular emphysema: Secondary | ICD-10-CM

## 2018-06-20 ENCOUNTER — Other Ambulatory Visit (HOSPITAL_COMMUNITY): Payer: Self-pay | Admitting: Internal Medicine

## 2018-06-21 ENCOUNTER — Other Ambulatory Visit (HOSPITAL_COMMUNITY): Payer: Self-pay | Admitting: Internal Medicine

## 2018-06-22 ENCOUNTER — Ambulatory Visit (HOSPITAL_COMMUNITY)
Admission: RE | Admit: 2018-06-22 | Discharge: 2018-06-22 | Disposition: A | Discharge status: Home / Self Care | Payer: PPO | Source: Ambulatory Visit | Attending: Internal Medicine | Admitting: Internal Medicine

## 2018-06-22 DIAGNOSIS — J432 Centrilobular emphysema: Secondary | ICD-10-CM | POA: Insufficient documentation

## 2018-06-22 LAB — PULMONARY FUNCTION TEST
DL/VA % PRED: 64 %
DL/VA: 3.16 ml/min/mmHg/L
DLCO unc % pred: 41 %
DLCO unc: 16.18 ml/min/mmHg
FEF 25-75 POST: 1.54 L/s
FEF 25-75 Pre: 1.8 L/sec
FEF2575-%Change-Post: -14 %
FEF2575-%Pred-Post: 50 %
FEF2575-%Pred-Pre: 59 %
FEV1-%CHANGE-POST: -5 %
FEV1-%PRED-PRE: 64 %
FEV1-%Pred-Post: 60 %
FEV1-PRE: 2.55 L
FEV1-Post: 2.42 L
FEV1FVC-%Change-Post: -5 %
FEV1FVC-%Pred-Pre: 97 %
FEV6-%Change-Post: -1 %
FEV6-%Pred-Post: 68 %
FEV6-%Pred-Pre: 69 %
FEV6-POST: 3.47 L
FEV6-PRE: 3.52 L
FEV6FVC-%Change-Post: -2 %
FEV6FVC-%PRED-POST: 102 %
FEV6FVC-%Pred-Pre: 104 %
FVC-%Change-Post: 0 %
FVC-%PRED-PRE: 66 %
FVC-%Pred-Post: 66 %
FVC-POST: 3.58 L
FVC-PRE: 3.55 L
POST FEV6/FVC RATIO: 97 %
PRE FEV6/FVC RATIO: 99 %
Post FEV1/FVC ratio: 68 %
Pre FEV1/FVC ratio: 72 %
RV % pred: 71 %
RV: 1.92 L
TLC % PRED: 69 %
TLC: 5.55 L

## 2018-06-22 MED ORDER — ALBUTEROL SULFATE (2.5 MG/3ML) 0.083% IN NEBU
2.5000 mg | INHALATION_SOLUTION | Freq: Once | RESPIRATORY_TRACT | Status: AC
  Administered 2018-06-22: 2.5 mg via RESPIRATORY_TRACT

## 2018-07-07 LAB — CUP PACEART REMOTE DEVICE CHECK
Battery Remaining Longevity: 77 mo
Battery Remaining Percentage: 73 %
Brady Statistic RV Percent Paced: 1 %
HIGH POWER IMPEDANCE MEASURED VALUE: 69 Ohm
HIGH POWER IMPEDANCE MEASURED VALUE: 69 Ohm
Implantable Pulse Generator Implant Date: 20160323
Lead Channel Impedance Value: 360 Ohm
Lead Channel Pacing Threshold Amplitude: 0.75 V
Lead Channel Pacing Threshold Pulse Width: 0.5 ms
Lead Channel Sensing Intrinsic Amplitude: 11.9 mV
Lead Channel Setting Pacing Amplitude: 2.5 V
Lead Channel Setting Pacing Pulse Width: 0.5 ms
Lead Channel Setting Sensing Sensitivity: 0.5 mV
MDC IDC LEAD IMPLANT DT: 20160323
MDC IDC LEAD LOCATION: 753860
MDC IDC MSMT BATTERY VOLTAGE: 3.02 V
MDC IDC SESS DTM: 20190702165705
Pulse Gen Serial Number: 7255600

## 2018-07-14 ENCOUNTER — Other Ambulatory Visit (HOSPITAL_COMMUNITY): Payer: Self-pay | Admitting: Internal Medicine

## 2018-07-14 ENCOUNTER — Other Ambulatory Visit: Payer: Self-pay | Admitting: Internal Medicine

## 2018-07-14 DIAGNOSIS — I5022 Chronic systolic (congestive) heart failure: Secondary | ICD-10-CM

## 2018-07-14 DIAGNOSIS — I4510 Unspecified right bundle-branch block: Secondary | ICD-10-CM

## 2018-07-14 DIAGNOSIS — I10 Essential (primary) hypertension: Secondary | ICD-10-CM

## 2018-07-14 DIAGNOSIS — I255 Ischemic cardiomyopathy: Secondary | ICD-10-CM

## 2018-07-14 NOTE — Telephone Encounter (Signed)
This is a CHF pt 

## 2018-07-21 ENCOUNTER — Other Ambulatory Visit (HOSPITAL_COMMUNITY): Payer: Self-pay | Admitting: Internal Medicine

## 2018-08-23 ENCOUNTER — Encounter: Payer: Self-pay | Admitting: Internal Medicine

## 2018-08-23 ENCOUNTER — Ambulatory Visit (INDEPENDENT_AMBULATORY_CARE_PROVIDER_SITE_OTHER): Payer: PPO | Admitting: Internal Medicine

## 2018-08-23 VITALS — BP 100/58 | HR 52 | Ht 75.0 in | Wt 204.6 lb

## 2018-08-23 DIAGNOSIS — Z95810 Presence of automatic (implantable) cardiac defibrillator: Secondary | ICD-10-CM

## 2018-08-23 DIAGNOSIS — I5022 Chronic systolic (congestive) heart failure: Secondary | ICD-10-CM | POA: Diagnosis not present

## 2018-08-23 DIAGNOSIS — I4901 Ventricular fibrillation: Secondary | ICD-10-CM | POA: Diagnosis not present

## 2018-08-23 MED ORDER — NITROGLYCERIN 0.4 MG SL SUBL: 0.4000 mg | SUBLINGUAL_TABLET | SUBLINGUAL | 3 refills | Status: DC | PRN

## 2018-08-23 NOTE — Progress Notes (Signed)
PCP: Martha ClanShaw, William, MD Primary Cardiologist: Dr Gala RomneyBensimhon Primary EP: Dr Johney FrameAllred  Kyle MuttonJonathan D Cragg is a 67 y.o. male who presents today for routine electrophysiology followup.  Since last being seen in our clinic, the patient reports doing very well.  Today, he denies symptoms of palpitations, chest pain, shortness of breath,  lower extremity edema, dizziness, presyncope, syncope, or ICD shocks.  The patient is otherwise without complaint today.   Past Medical History:  Diagnosis Date  . Abnormal TSH    a. 11/2014: felt d/t sick euthyroid.  Marland Kitchen. AICD (automatic cardioverter/defibrillator) present 02/28/2015  . CAD (coronary artery disease)    a. 11/2014: arrest/STEMI s/p Xience DES to the LAD and PTCA to the D1 [place] b. Relook cath 11/2014: no culprit, patent stent.  . CHF (congestive heart failure) (HCC)   . Dressler's syndrome (HCC)    a. 11/2014: tx with colchicine and prednisone.  . Dyslipidemia   . Dyspnea    a. 11/2014: possibly due to combo of CHF and Brilinta. Brilinta changed to Effient.  . Erectile dysfunction    a. Pt aware not to take ED med within 24 hr of NTG and vice versa.  . Hypertension   . Ischemic cardiomyopathy    a. 11/2014: EF reportedly 20% by cath and [place] in [place].  Marland Kitchen. NSVT (nonsustained ventricular tachycardia) (HCC) 11/16/14   a. 11/2014: admitted after discharge from STEMI admission, 18 beats 135-140bpm in ED.  Marland Kitchen. Pericardial effusion    a. 11/2014.  Marland Kitchen. Pericarditis dx'd 11/2014  . Pneumonia 1990's X 2  . RBBB   . Sleep apnea    suspected but not diagnosed (02/28/2015)  . STEMI (ST elevation myocardial infarction) (HCC) 11/10/2014  . Tobacco abuse   . VF (ventricular fibrillation) (HCC) 11/10/14   a. 11/2014: arrest with STEMI.   Past Surgical History:  Procedure Laterality Date  . CARDIAC CATHETERIZATION  11/19/2014  . CARDIAC DEFIBRILLATOR PLACEMENT  02/28/2015  . CORONARY ANGIOPLASTY WITH STENT PLACEMENT  11/10/2014   LAD DES  . HAND  SURGERY Bilateral    Dupuytren's Contracture  . IMPLANTABLE CARDIOVERTER DEFIBRILLATOR IMPLANT N/A 02/28/2015   SJM Fortify Assura VR ICD implanted by Dr Johney FrameAllred.  Revascularized following VF arrest, EF remained depressed and ICD implanted  . KNEE ARTHROSCOPY Right 1982  . LEFT HEART CATH N/A 11/19/2014   Procedure: LEFT HEART CATH;  Surgeon: Runell GessJonathan J Berry, MD;  [facility];  Service: Cardiovascular;  Laterality: N/A;    ROS- all systems are reviewed and negative except as per HPI above  Current Outpatient Medications  Medication Sig Dispense Refill  . ANORO ELLIPTA 62.5-25 MCG/INH AEPB Inhale 1 puff into the lungs daily.  11  . aspirin EC 81 MG tablet Take 81 mg by mouth daily.    Marland Kitchen. atorvastatin (LIPITOR) 20 MG tablet TAKE 1 TABLET BY MOUTH DAILY AT 6PM 90 tablet 0  . carvedilol (COREG) 6.25 MG tablet TAKE 1 TABLET BY MOUTH 2 TIMES DAILY WITH MEAL. 180 tablet 1  . clopidogrel (PLAVIX) 75 MG tablet Take 1 tablet (75 mg total) by mouth daily. 90 tablet 3  . ENTRESTO 24-26 MG TAKE 1 TABLET BY MOUTH TWICE DAILY 180 tablet 0  . furosemide (LASIX) 40 MG tablet TAKE 1 TABLET BY MOUTH TWICE DAILY (Patient taking differently: Take 40 mg by mouth daily. ) 180 tablet 0  . levothyroxine (SYNTHROID, LEVOTHROID) 50 MCG tablet Take 50 mcg by mouth daily before breakfast.    . nitroGLYCERIN (  NITROSTAT) 0.4 MG SL tablet Place 0.4 mg under the tongue every 5 (five) minutes as needed for chest pain (MAX 3 TABLETS). Reported on 12/13/2015    . spironolactone (ALDACTONE) 25 MG tablet TAKE 1 TABLET(25 MG) BY MOUTH DAILY 90 tablet 0   No current facility-administered medications for this visit.     Physical Exam: Vitals:   08/23/18 1219  BP: (!) 100/58  Pulse: (!) 52  SpO2: 96%  Weight: 204 lb 9.6 oz (92.8 kg)  Height: 6\' 3"  (1.905 m)    GEN- The patient is well appearing, alert and oriented x 3 today.   Head- normocephalic, atraumatic Eyes-  Sclera clear, conjunctiva pink Ears- hearing  intact Oropharynx- clear Lungs- Clear to ausculation bilaterally, normal work of breathing Chest- ICD pocket is well healed Heart- Regular rate and rhythm, no murmurs, rubs or gallops, PMI not laterally displaced GI- soft, NT, ND, + BS Extremities- no clubbing, cyanosis, or edema  ICD interrogation- reviewed in detail today,  See PACEART report  ekg tracing ordered today is personally reviewed and shows sinus rhythm 52 bpm, PR 182 msec, QRS 122 msec, RBBB, nonspecific St/T changes  Wt Readings from Last 3 Encounters:  08/23/18 204 lb 9.6 oz (92.8 kg)  03/10/18 204 lb 4 oz (92.6 kg)  08/20/17 203 lb 9.6 oz (92.4 kg)    Assessment and Plan:  1.  Chronic systolic dysfunction/ ischemic CM/ CAD/ s/p prior VF arrest euvolemic today Stable on an appropriate medical regimen Normal ICD function See Pace Art report No changes today Declines to be followed in ICM device clinic  2. Tobacco He has quit  Merlin Return in a year to see EP NP  Hillis Range MD, [facility] 08/23/2018 12:58 PM

## 2018-08-23 NOTE — Patient Instructions (Addendum)
Medication Instructions:  Your physician recommends that you continue on your current medications as directed. Please refer to the Current Medication list given to you today.  Labwork: None ordered.  Testing/Procedures: None ordered.  Follow-Up: Your physician wants you to follow-up in: one year with Gypsy BalsamAmber Seiler, NP.   You will receive a reminder letter in the mail two months in advance. If you don't receive a letter, please call our office to schedule the follow-up appointment.  Remote monitoring is used to monitor your ICD from home. This monitoring reduces the number of office visits required to check your device to one time per year. It allows us to keep an eye on the functioning of your device to ensure it is working properly. You are scheduled for a device check from home on 09/07/2018. You may send your transmission at any time that day. If you have a wireless device, the transmission will be sent automatically. After your physician reviews your transmission, you will receive a postcard with your next transmission date.  Any Other Special Instructions Will Be Listed Below (If Applicable).  If you need a refill on your cardiac medications before your next appointment, please call your pharmacy.

## 2018-09-04 DIAGNOSIS — Z23 Encounter for immunization: Secondary | ICD-10-CM | POA: Diagnosis not present

## 2018-09-07 ENCOUNTER — Ambulatory Visit (INDEPENDENT_AMBULATORY_CARE_PROVIDER_SITE_OTHER): Payer: PPO | Admitting: *Deleted

## 2018-09-07 DIAGNOSIS — I4901 Ventricular fibrillation: Secondary | ICD-10-CM

## 2018-09-07 DIAGNOSIS — I5022 Chronic systolic (congestive) heart failure: Secondary | ICD-10-CM

## 2018-09-07 NOTE — Progress Notes (Signed)
Remote ICD transmission.   

## 2018-09-21 LAB — CUP PACEART INCLINIC DEVICE CHECK
Brady Statistic RV Percent Paced: 1 % — CL
HIGH POWER IMPEDANCE MEASURED VALUE: 72 Ohm
Implantable Lead Location: 753860
Lead Channel Impedance Value: 380 Ohm
Lead Channel Pacing Threshold Pulse Width: 0.5 ms
Lead Channel Setting Pacing Amplitude: 2.5 V
Lead Channel Setting Pacing Pulse Width: 0.5 ms
Lead Channel Setting Sensing Sensitivity: 0.5 mV
MDC IDC LEAD IMPLANT DT: 20160323
MDC IDC MSMT LEADCHNL RV PACING THRESHOLD AMPLITUDE: 0.5 V
MDC IDC MSMT LEADCHNL RV SENSING INTR AMPL: 11.9 mV
MDC IDC PG IMPLANT DT: 20160323
MDC IDC PG SERIAL: 7255600
MDC IDC SESS DTM: 20191015133404

## 2018-09-30 LAB — CUP PACEART REMOTE DEVICE CHECK
Battery Remaining Longevity: 76 mo
Battery Remaining Percentage: 71 %
Battery Voltage: 3.02 V
Brady Statistic RV Percent Paced: 1 %
Date Time Interrogation Session: 20191001060016
HIGH POWER IMPEDANCE MEASURED VALUE: 74 Ohm
HIGH POWER IMPEDANCE MEASURED VALUE: 74 Ohm
Lead Channel Impedance Value: 360 Ohm
Lead Channel Setting Pacing Pulse Width: 0.5 ms
MDC IDC LEAD IMPLANT DT: 20160323
MDC IDC LEAD LOCATION: 753860
MDC IDC MSMT LEADCHNL RV PACING THRESHOLD AMPLITUDE: 0.5 V
MDC IDC MSMT LEADCHNL RV PACING THRESHOLD PULSEWIDTH: 0.5 ms
MDC IDC MSMT LEADCHNL RV SENSING INTR AMPL: 11.9 mV
MDC IDC PG IMPLANT DT: 20160323
MDC IDC SET LEADCHNL RV PACING AMPLITUDE: 2.5 V
MDC IDC SET LEADCHNL RV SENSING SENSITIVITY: 0.5 mV
Pulse Gen Serial Number: 7255600

## 2018-10-06 ENCOUNTER — Other Ambulatory Visit: Payer: Self-pay

## 2018-10-06 ENCOUNTER — Encounter (HOSPITAL_COMMUNITY): Payer: Self-pay | Admitting: Internal Medicine

## 2018-10-06 ENCOUNTER — Ambulatory Visit (HOSPITAL_COMMUNITY)
Admission: RE | Admit: 2018-10-06 | Discharge: 2018-10-06 | Disposition: A | Discharge status: Home / Self Care | Payer: PPO | Source: Ambulatory Visit | Attending: Internal Medicine | Admitting: Internal Medicine

## 2018-10-06 VITALS — BP 113/65 | HR 62

## 2018-10-06 DIAGNOSIS — I519 Heart disease, unspecified: Secondary | ICD-10-CM

## 2018-10-06 DIAGNOSIS — I110 Hypertensive heart disease with heart failure: Secondary | ICD-10-CM | POA: Diagnosis not present

## 2018-10-06 DIAGNOSIS — Z95810 Presence of automatic (implantable) cardiac defibrillator: Secondary | ICD-10-CM | POA: Diagnosis not present

## 2018-10-06 DIAGNOSIS — I509 Heart failure, unspecified: Secondary | ICD-10-CM | POA: Diagnosis not present

## 2018-10-06 DIAGNOSIS — Z955 Presence of coronary angioplasty implant and graft: Secondary | ICD-10-CM | POA: Insufficient documentation

## 2018-10-06 DIAGNOSIS — Z888 Allergy status to other drugs, medicaments and biological substances status: Secondary | ICD-10-CM | POA: Diagnosis not present

## 2018-10-06 DIAGNOSIS — I5022 Chronic systolic (congestive) heart failure: Secondary | ICD-10-CM

## 2018-10-06 DIAGNOSIS — G4730 Sleep apnea, unspecified: Secondary | ICD-10-CM | POA: Diagnosis not present

## 2018-10-06 DIAGNOSIS — Z7982 Long term (current) use of aspirin: Secondary | ICD-10-CM | POA: Diagnosis not present

## 2018-10-06 DIAGNOSIS — I255 Ischemic cardiomyopathy: Secondary | ICD-10-CM | POA: Insufficient documentation

## 2018-10-06 DIAGNOSIS — J449 Chronic obstructive pulmonary disease, unspecified: Secondary | ICD-10-CM | POA: Diagnosis not present

## 2018-10-06 DIAGNOSIS — E785 Hyperlipidemia, unspecified: Secondary | ICD-10-CM | POA: Diagnosis not present

## 2018-10-06 DIAGNOSIS — Z79899 Other long term (current) drug therapy: Secondary | ICD-10-CM | POA: Insufficient documentation

## 2018-10-06 DIAGNOSIS — I2510 Atherosclerotic heart disease of native coronary artery without angina pectoris: Secondary | ICD-10-CM | POA: Diagnosis not present

## 2018-10-06 DIAGNOSIS — I252 Old myocardial infarction: Secondary | ICD-10-CM | POA: Insufficient documentation

## 2018-10-06 DIAGNOSIS — I4510 Unspecified right bundle-branch block: Secondary | ICD-10-CM | POA: Insufficient documentation

## 2018-10-06 NOTE — Patient Instructions (Signed)
Your physician has requested that you have an echocardiogram. Echocardiography is a painless test that uses sound waves to create images of your heart. It provides your doctor with information about the size and shape of your heart and how well your heart's chambers and valves are working. This procedure takes approximately one hour. There are no restrictions for this procedure.  Your physician has recommended that you have a cardiopulmonary stress test (CPX). CPX testing is a non-invasive measurement of heart and lung function. It replaces a traditional treadmill stress test. This type of test provides a tremendous amount of information that relates not only to your present condition but also for future outcomes. This test combines measurements of you ventilation, respiratory gas exchange in the lungs, electrocardiogram (EKG), blood pressure and physical response before, during, and following an exercise protocol.  We will contact you in 6 months to schedule your next appointment.  

## 2018-10-06 NOTE — Progress Notes (Signed)
Patient ID: Kyle Bullock, male   DOB: [birth year], 67 y.o.   MRN: [id number]    Advanced Heart Failure Clinic Note    History of Present Illness: Kyle Bullock is a 67 y.o. male who is referred to the HF Clinic by Dr. Excell Seltzer. He has h/o CAD and ischemic cardiomyopathy 20-25%.  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    In December 2015, he suffered an acute anterolateral MI complicated by VF arrest in [place]. He underwent drug-eluting stent placement to the LAD and PTCA of the diagonal. EF was 25% and a life vest was placed prior to discharge. Following return to [place], he developed dyspnea and presented to [facility] where he was readmitted. His Brilinta was switched to Effient as it was felt the Brilinta may have been playing a role in his dyspnea. He was also switched from an ACE inhibitor to an ARB. He was seen by electrophysiology with recommendation for ongoing life vest therapy and follow-up echo in 90 days post revascularization. He was then seen in clinic in mid December where he reported pleuritic chest pain. He was readmitted and underwent diagnostic catheterization revealing patency of previously placed stent and area of balloon angioplasty in the diagonal. EF was 20-25% with diffuse hypokinesis and normal pericardial appearance. He did have some hypotension requiring discontinuation of ARB therapy. His beta blocker was also down titrated. He was diagnosed with Dressler's syndrome and was placed on prednisone taper and colchicine.   CT with COPD. Referred to Dr. Kendrick Fries who felt he had mild COPD but no ILD. Given PRN albuterol. Has repeat CT scheduled for 1 year.   He returns today for regular follow up. He feels really good. Has been travelling a bunch. Walking 2 - 2.5 miles per day. Fluid up and down a bit. Gets dizzy occasionally when he stands. No problems with med. Weight stable 200 at home   ICD No VT/AF. Fluid status up and down. Personally  reviewed   Echo 3/16 EF 25-30% Echo 03/2016 EF 20-25% Echo 9/18 EF 20-25% RV normal   CPX 2/18 FVC 3.49 (72%)    FEV1 2.49 (68%)     FEV1/FVC 71 (93%)     MVV 117 (82%) Resting HR: 62 Peak HR: 148  (97% age predicted max HR) BP rest: 84/60 BP peak: 156/62 Peak VO2: 19.6 (69% predicted peak VO2) VE/VCO2 slope: 37 OUES: 2.14 Peak RER: 1.06 Ventilatory Threshold: 17.4 (61% predicted or measured peak VO2) VE/MVV: 66% O2pulse: 12  (71% predicted O2pulse)  CPX 05/14/2015  Peak VO2: 19.0 (64.5% predicted peak VO2) VE/VCO2 slope: 41.5 OUES: 2.01 Peak RER: 1.06 Ventilatory Threshold: 14.1     (47.9% predicted or measured peak VO2)   Past Medical History:  Diagnosis Date  . Abnormal TSH    a. 11/2014: felt d/t sick euthyroid.  Marland Kitchen AICD (automatic cardioverter/defibrillator) present 02/28/2015  . CAD (coronary artery disease)    a. 11/2014: arrest/STEMI s/p Xience DES to the LAD and PTCA to the D1 [place], [place]). b. Relook cath 11/2014: no culprit, patent stent.  . CHF (congestive heart failure) (HCC)   . Dressler's syndrome (HCC)    a. 11/2014: tx with colchicine and prednisone.  . Dyslipidemia   . Dyspnea    a. 11/2014: possibly due to combo of CHF and Brilinta. Brilinta changed to Effient.  . Erectile dysfunction    a. Pt aware not to take ED med within 24 hr of NTG and vice versa.  Marland Kitchen  Hypertension   . Ischemic cardiomyopathy    a. 11/2014: EF reportedly 20% by cath and echo in [place].  Marland Kitchen NSVT (nonsustained ventricular tachycardia) (HCC) 11/16/14   a. 11/2014: admitted after discharge from STEMI admission, 18 beats 135-140bpm in ED.  Marland Kitchen Pericardial effusion    a. 11/2014.  Marland Kitchen Pericarditis dx'd 11/2014  . Pneumonia 1990's X 2  . RBBB   . Sleep apnea    suspected but not diagnosed (02/28/2015)  . STEMI (ST elevation myocardial infarction) (HCC) 11/10/2014  . Tobacco abuse   . VF (ventricular fibrillation) (HCC) 11/10/14   a. 11/2014: arrest with STEMI.     Past Surgical History:  Procedure Laterality Date  . CARDIAC CATHETERIZATION  11/19/2014  . CARDIAC DEFIBRILLATOR PLACEMENT  02/28/2015  . CORONARY ANGIOPLASTY WITH STENT PLACEMENT  11/10/2014   LAD DES  . HAND SURGERY Bilateral    Dupuytren's Contracture  . IMPLANTABLE CARDIOVERTER DEFIBRILLATOR IMPLANT N/A 02/28/2015   SJM Fortify Assura VR ICD implanted by Dr Johney Frame.  Revascularized following VF arrest, EF remained depressed and ICD implanted  . KNEE ARTHROSCOPY Right 1982  . LEFT HEART CATH N/A 11/19/2014   Procedure: LEFT HEART CATH;  Surgeon: Runell Gess, MD;  [facility];  Service: Cardiovascular;  Laterality: N/A;    Current Outpatient Medications  Medication Sig Dispense Refill  . ANORO ELLIPTA 62.5-25 MCG/INH AEPB Inhale 1 puff into the lungs daily.  11  . aspirin EC 81 MG tablet Take 81 mg by mouth daily.    Marland Kitchen atorvastatin (LIPITOR) 20 MG tablet TAKE 1 TABLET BY MOUTH DAILY AT 6PM 90 tablet 0  . carvedilol (COREG) 6.25 MG tablet TAKE 1 TABLET BY MOUTH 2 TIMES DAILY WITH MEAL. 180 tablet 1  . clopidogrel (PLAVIX) 75 MG tablet Take 1 tablet (75 mg total) by mouth daily. 90 tablet 3  . ENTRESTO 24-26 MG TAKE 1 TABLET BY MOUTH TWICE DAILY 180 tablet 0  . furosemide (LASIX) 40 MG tablet Take 40 mg by mouth daily as needed.    Marland Kitchen levothyroxine (SYNTHROID, LEVOTHROID) 50 MCG tablet Take 50 mcg by mouth daily before breakfast.    . nitroGLYCERIN (NITROSTAT) 0.4 MG SL tablet Place 1 tablet (0.4 mg total) under the tongue every 5 (five) minutes as needed for chest pain (MAX 3 TABLETS). Reported on 12/13/2015 10 tablet 3  . spironolactone (ALDACTONE) 25 MG tablet TAKE 1 TABLET(25 MG) BY MOUTH DAILY 90 tablet 0   No current facility-administered medications for this encounter.     Allergies:   Ambien [zolpidem tartrate]; Lisinopril; and Brilinta [ticagrelor]   Social History:  The patient  reports that he quit smoking about 3 years ago. His smoking use included  cigarettes. He has a 80.00 pack-year smoking history. He has never used smokeless tobacco. He reports that he drinks about 14.0 standard drinks of alcohol per week. He reports that he does not use drugs.   Family History:  The patient's  family history includes COPD in his mother; Hypertension in his father and mother.   Review of systems complete and found to be negative unless listed in HPI.     Vitals:   10/06/18 1207  BP: 113/65  Pulse: 62  SpO2: 98%    Wt Readings from Last 3 Encounters:  08/23/18 92.8 kg (204 lb 9.6 oz)  03/10/18 92.6 kg (204 lb 4 oz)  08/20/17 92.4 kg (203 lb 9.6 oz)   Physical exam: General:  Well appearing. No resp difficulty HEENT:  normal Neck: supple. no JVD. Carotids 2+ bilat; no bruits. No lymphadenopathy or thryomegaly appreciated. Cor: PMI nondisplaced. Regular rate & rhythm. No rubs, gallops or murmurs. Lungs: clear Abdomen: soft, nontender, nondistended. No hepatosplenomegaly. No bruits or masses. Good bowel sounds. Extremities: no cyanosis, clubbing, rash, edema Neuro: alert & orientedx3, cranial nerves grossly intact. moves all 4 extremities w/o difficulty. Affect pleasant   Recent Labs: 03/10/2018: BUN 18; Creatinine, Ser 1.12; Potassium 4.9; Sodium 137   Lipid Panel     Component Value Date/Time   CHOL 108 01/08/2015 0850   CHOL 101 12/04/2014 1030   TRIG 155.0 (H) 01/08/2015 0850   HDL 39.00 (L) 01/08/2015 0850   HDL 43 12/04/2014 1030   CHOLHDL 3 01/08/2015 0850   VLDL 31.0 01/08/2015 0850   LDLCALC 38 01/08/2015 0850   LDLCALC 39 12/04/2014 1030     ASSESSMENT AND PLAN:  1. Chronic systolic due to ischemic CM  - Echo 03/2016 LVEF 20-25%, Grade 2 DD - Echo 3/18 shows with stable LVEF 20-25%. Normal RV. Images reviewed personally by Dr. Gala Romney.  - Echo 9/18 EF 20-25%, grade 2 DD - ICD interrogated personally. Volume up and down. No VT/VF.  - Doing well. Stable NYHA class I-II symptoms.  - Volume status stable on lasix 20 mg  daily.   - Continue Entresto 24/26.  Up-titration Limited by hypotension.  - Continue carvedilol 6.25 mg BID.  - Continue sprio 25 mg daily.   - Reinforced fluid restriction to < 2 L daily, sodium restriction to less than 2000 mg daily, and the importance of daily weights.   - Will see back in 6 months with repat echo and CPX. Discussed possible consideration of LVAD down the road.  2.  CAD s/p Anterior MI December 2015:  - No s/s ischemia.  - Continue ASA, statin and Effient Dr. Excell Seltzer  - Lipids per Dr. Eric Form. Last LDL < 56 (1/19) Goal LDL < 70 3. Tobacco abuse:  - Congratulated on continued smoking cessation.  4. Emphysema - Sees pulmonary. Spirometry on CPX previously reviewed with only mild disease.  5. Hypotension - As above. He has not tolerated up-titration of meds in the past. Continue current medications.   Arvilla Meres, MD  2:42 PM

## 2018-10-18 ENCOUNTER — Other Ambulatory Visit (HOSPITAL_COMMUNITY): Payer: Self-pay | Admitting: Internal Medicine

## 2018-10-18 DIAGNOSIS — I5022 Chronic systolic (congestive) heart failure: Secondary | ICD-10-CM

## 2018-10-18 DIAGNOSIS — I255 Ischemic cardiomyopathy: Secondary | ICD-10-CM

## 2018-10-18 DIAGNOSIS — I10 Essential (primary) hypertension: Secondary | ICD-10-CM

## 2018-10-18 DIAGNOSIS — I4510 Unspecified right bundle-branch block: Secondary | ICD-10-CM

## 2018-11-09 ENCOUNTER — Other Ambulatory Visit (HOSPITAL_COMMUNITY): Payer: Self-pay | Admitting: Internal Medicine

## 2018-12-07 ENCOUNTER — Ambulatory Visit (INDEPENDENT_AMBULATORY_CARE_PROVIDER_SITE_OTHER): Payer: PPO

## 2018-12-07 DIAGNOSIS — I4901 Ventricular fibrillation: Secondary | ICD-10-CM

## 2018-12-07 DIAGNOSIS — I255 Ischemic cardiomyopathy: Secondary | ICD-10-CM

## 2018-12-07 LAB — CUP PACEART REMOTE DEVICE CHECK
Battery Remaining Longevity: 72 mo
Battery Remaining Percentage: 69 %
Battery Voltage: 3.02 V
Date Time Interrogation Session: 20191231120708
HIGH POWER IMPEDANCE MEASURED VALUE: 65 Ohm
HighPow Impedance: 65 Ohm
Implantable Lead Location: 753860
Lead Channel Sensing Intrinsic Amplitude: 11.9 mV
Lead Channel Setting Pacing Amplitude: 2.5 V
MDC IDC LEAD IMPLANT DT: 20160323
MDC IDC MSMT LEADCHNL RV IMPEDANCE VALUE: 350 Ohm
MDC IDC MSMT LEADCHNL RV PACING THRESHOLD AMPLITUDE: 0.5 V
MDC IDC MSMT LEADCHNL RV PACING THRESHOLD PULSEWIDTH: 0.5 ms
MDC IDC PG IMPLANT DT: 20160323
MDC IDC PG SERIAL: 7255600
MDC IDC SET LEADCHNL RV PACING PULSEWIDTH: 0.5 ms
MDC IDC SET LEADCHNL RV SENSING SENSITIVITY: 0.5 mV
MDC IDC STAT BRADY RV PERCENT PACED: 1 %

## 2018-12-09 ENCOUNTER — Other Ambulatory Visit (HOSPITAL_COMMUNITY): Payer: Self-pay | Admitting: Internal Medicine

## 2018-12-09 NOTE — Progress Notes (Signed)
Remote ICD transmission.   

## 2018-12-13 ENCOUNTER — Other Ambulatory Visit: Payer: Self-pay | Admitting: Internal Medicine

## 2018-12-21 DIAGNOSIS — I10 Essential (primary) hypertension: Secondary | ICD-10-CM | POA: Diagnosis not present

## 2018-12-21 DIAGNOSIS — M109 Gout, unspecified: Secondary | ICD-10-CM | POA: Diagnosis not present

## 2018-12-21 DIAGNOSIS — R82998 Other abnormal findings in urine: Secondary | ICD-10-CM | POA: Diagnosis not present

## 2018-12-21 DIAGNOSIS — E7849 Other hyperlipidemia: Secondary | ICD-10-CM | POA: Diagnosis not present

## 2018-12-21 DIAGNOSIS — Z125 Encounter for screening for malignant neoplasm of prostate: Secondary | ICD-10-CM | POA: Diagnosis not present

## 2018-12-28 DIAGNOSIS — I10 Essential (primary) hypertension: Secondary | ICD-10-CM | POA: Diagnosis not present

## 2018-12-28 DIAGNOSIS — E038 Other specified hypothyroidism: Secondary | ICD-10-CM | POA: Diagnosis not present

## 2018-12-28 DIAGNOSIS — I255 Ischemic cardiomyopathy: Secondary | ICD-10-CM | POA: Diagnosis not present

## 2018-12-28 DIAGNOSIS — Z0000 Encounter for general adult medical examination without abnormal findings: Secondary | ICD-10-CM | POA: Diagnosis not present

## 2018-12-28 DIAGNOSIS — Z6825 Body mass index (BMI) 25.0-25.9, adult: Secondary | ICD-10-CM | POA: Diagnosis not present

## 2018-12-28 DIAGNOSIS — E781 Pure hyperglyceridemia: Secondary | ICD-10-CM | POA: Diagnosis not present

## 2018-12-28 DIAGNOSIS — I5022 Chronic systolic (congestive) heart failure: Secondary | ICD-10-CM | POA: Diagnosis not present

## 2018-12-28 DIAGNOSIS — Z1331 Encounter for screening for depression: Secondary | ICD-10-CM | POA: Diagnosis not present

## 2018-12-28 DIAGNOSIS — E7849 Other hyperlipidemia: Secondary | ICD-10-CM | POA: Diagnosis not present

## 2018-12-28 DIAGNOSIS — I700 Atherosclerosis of aorta: Secondary | ICD-10-CM | POA: Diagnosis not present

## 2018-12-28 DIAGNOSIS — N183 Chronic kidney disease, stage 3 (moderate): Secondary | ICD-10-CM | POA: Diagnosis not present

## 2018-12-28 DIAGNOSIS — Z1389 Encounter for screening for other disorder: Secondary | ICD-10-CM | POA: Diagnosis not present

## 2018-12-28 DIAGNOSIS — R7301 Impaired fasting glucose: Secondary | ICD-10-CM | POA: Diagnosis not present

## 2018-12-29 DIAGNOSIS — Z1212 Encounter for screening for malignant neoplasm of rectum: Secondary | ICD-10-CM | POA: Diagnosis not present

## 2018-12-31 ENCOUNTER — Other Ambulatory Visit: Payer: Self-pay | Admitting: Internal Medicine

## 2018-12-31 ENCOUNTER — Other Ambulatory Visit (HOSPITAL_COMMUNITY): Payer: Self-pay | Admitting: Internal Medicine

## 2018-12-31 DIAGNOSIS — F17201 Nicotine dependence, unspecified, in remission: Secondary | ICD-10-CM

## 2019-02-02 ENCOUNTER — Ambulatory Visit
Admission: RE | Admit: 2019-02-02 | Discharge: 2019-02-02 | Disposition: A | Discharge status: Home / Self Care | Payer: PPO | Source: Ambulatory Visit | Attending: Internal Medicine | Admitting: Internal Medicine

## 2019-02-02 DIAGNOSIS — J439 Emphysema, unspecified: Secondary | ICD-10-CM | POA: Diagnosis not present

## 2019-02-02 DIAGNOSIS — F17201 Nicotine dependence, unspecified, in remission: Secondary | ICD-10-CM

## 2019-02-14 ENCOUNTER — Other Ambulatory Visit (HOSPITAL_COMMUNITY): Payer: Self-pay | Admitting: Internal Medicine

## 2019-03-08 ENCOUNTER — Other Ambulatory Visit (HOSPITAL_COMMUNITY): Payer: Self-pay | Admitting: Internal Medicine

## 2019-03-08 ENCOUNTER — Ambulatory Visit (INDEPENDENT_AMBULATORY_CARE_PROVIDER_SITE_OTHER): Payer: PPO | Admitting: *Deleted

## 2019-03-08 ENCOUNTER — Other Ambulatory Visit: Payer: Self-pay

## 2019-03-08 DIAGNOSIS — I255 Ischemic cardiomyopathy: Secondary | ICD-10-CM

## 2019-03-08 DIAGNOSIS — I4901 Ventricular fibrillation: Secondary | ICD-10-CM

## 2019-03-08 DIAGNOSIS — I519 Heart disease, unspecified: Secondary | ICD-10-CM

## 2019-03-08 LAB — CUP PACEART REMOTE DEVICE CHECK
Battery Remaining Percentage: 66 %
Battery Voltage: 3.02 V
Brady Statistic RV Percent Paced: 1 %
Date Time Interrogation Session: 20200331060017
HighPow Impedance: 74 Ohm
HighPow Impedance: 74 Ohm
Implantable Lead Implant Date: 20160323
Implantable Lead Location: 753860
Lead Channel Pacing Threshold Pulse Width: 0.5 ms
Lead Channel Sensing Intrinsic Amplitude: 12 mV
Lead Channel Setting Pacing Pulse Width: 0.5 ms
MDC IDC MSMT BATTERY REMAINING LONGEVITY: 71 mo
MDC IDC MSMT LEADCHNL RV IMPEDANCE VALUE: 380 Ohm
MDC IDC MSMT LEADCHNL RV PACING THRESHOLD AMPLITUDE: 0.5 V
MDC IDC PG IMPLANT DT: 20160323
MDC IDC SET LEADCHNL RV PACING AMPLITUDE: 2.5 V
MDC IDC SET LEADCHNL RV SENSING SENSITIVITY: 0.5 mV
Pulse Gen Serial Number: 7255600

## 2019-03-16 ENCOUNTER — Encounter: Payer: Self-pay | Admitting: Cardiology

## 2019-03-16 NOTE — Progress Notes (Signed)
Remote ICD transmission.   

## 2019-04-18 ENCOUNTER — Other Ambulatory Visit (HOSPITAL_COMMUNITY): Payer: Self-pay | Admitting: Internal Medicine

## 2019-04-18 DIAGNOSIS — I4510 Unspecified right bundle-branch block: Secondary | ICD-10-CM

## 2019-04-18 DIAGNOSIS — I255 Ischemic cardiomyopathy: Secondary | ICD-10-CM

## 2019-04-18 DIAGNOSIS — I5022 Chronic systolic (congestive) heart failure: Secondary | ICD-10-CM

## 2019-04-18 DIAGNOSIS — I10 Essential (primary) hypertension: Secondary | ICD-10-CM

## 2019-05-05 DIAGNOSIS — N183 Chronic kidney disease, stage 3 (moderate): Secondary | ICD-10-CM | POA: Diagnosis not present

## 2019-05-05 DIAGNOSIS — E7849 Other hyperlipidemia: Secondary | ICD-10-CM | POA: Diagnosis not present

## 2019-05-09 ENCOUNTER — Other Ambulatory Visit (HOSPITAL_COMMUNITY): Payer: Self-pay | Admitting: Internal Medicine

## 2019-06-07 ENCOUNTER — Ambulatory Visit (INDEPENDENT_AMBULATORY_CARE_PROVIDER_SITE_OTHER): Payer: PPO | Admitting: *Deleted

## 2019-06-07 DIAGNOSIS — I255 Ischemic cardiomyopathy: Secondary | ICD-10-CM

## 2019-06-07 DIAGNOSIS — I5022 Chronic systolic (congestive) heart failure: Secondary | ICD-10-CM

## 2019-06-07 LAB — CUP PACEART REMOTE DEVICE CHECK
Battery Remaining Longevity: 70 mo
Battery Remaining Percentage: 65 %
Battery Voltage: 3.01 V
Brady Statistic RV Percent Paced: 1 %
Date Time Interrogation Session: 20200630093548
HighPow Impedance: 74 Ohm
HighPow Impedance: 74 Ohm
Implantable Lead Implant Date: 20160323
Implantable Lead Location: 753860
Implantable Pulse Generator Implant Date: 20160323
Lead Channel Impedance Value: 360 Ohm
Lead Channel Pacing Threshold Amplitude: 0.5 V
Lead Channel Pacing Threshold Pulse Width: 0.5 ms
Lead Channel Sensing Intrinsic Amplitude: 11.9 mV
Lead Channel Setting Pacing Amplitude: 2.5 V
Lead Channel Setting Pacing Pulse Width: 0.5 ms
Lead Channel Setting Sensing Sensitivity: 0.5 mV
Pulse Gen Serial Number: 7255600

## 2019-06-19 ENCOUNTER — Encounter: Payer: Self-pay | Admitting: Cardiology

## 2019-06-19 NOTE — Progress Notes (Signed)
Remote ICD transmission.   

## 2019-07-22 ENCOUNTER — Other Ambulatory Visit (HOSPITAL_COMMUNITY): Payer: Self-pay | Admitting: Internal Medicine

## 2019-08-17 ENCOUNTER — Other Ambulatory Visit (HOSPITAL_COMMUNITY): Payer: Self-pay

## 2019-08-17 DIAGNOSIS — I5022 Chronic systolic (congestive) heart failure: Secondary | ICD-10-CM

## 2019-08-17 DIAGNOSIS — I255 Ischemic cardiomyopathy: Secondary | ICD-10-CM

## 2019-08-17 DIAGNOSIS — I10 Essential (primary) hypertension: Secondary | ICD-10-CM

## 2019-08-17 DIAGNOSIS — I4510 Unspecified right bundle-branch block: Secondary | ICD-10-CM

## 2019-08-17 MED ORDER — ATORVASTATIN CALCIUM 20 MG PO TABS: ORAL_TABLET | ORAL | 0 refills | Status: DC

## 2019-08-17 MED ORDER — SPIRONOLACTONE 25 MG PO TABS: ORAL_TABLET | ORAL | 1 refills | Status: DC

## 2019-08-24 ENCOUNTER — Encounter: Payer: PPO | Admitting: Nurse Practitioner

## 2019-08-27 DIAGNOSIS — Z23 Encounter for immunization: Secondary | ICD-10-CM | POA: Diagnosis not present

## 2019-08-29 ENCOUNTER — Other Ambulatory Visit (HOSPITAL_COMMUNITY): Payer: Self-pay | Admitting: *Deleted

## 2019-08-29 MED ORDER — ENTRESTO 24-26 MG PO TABS: 1.0000 | ORAL_TABLET | Freq: Two times a day (BID) | ORAL | 1 refills | Status: DC

## 2019-08-30 ENCOUNTER — Other Ambulatory Visit (HOSPITAL_COMMUNITY): Payer: Self-pay

## 2019-08-30 MED ORDER — ENTRESTO 24-26 MG PO TABS: 1.0000 | ORAL_TABLET | Freq: Two times a day (BID) | ORAL | 1 refills | Status: DC

## 2019-09-06 ENCOUNTER — Ambulatory Visit (INDEPENDENT_AMBULATORY_CARE_PROVIDER_SITE_OTHER): Payer: PPO | Admitting: *Deleted

## 2019-09-06 DIAGNOSIS — I5022 Chronic systolic (congestive) heart failure: Secondary | ICD-10-CM

## 2019-09-06 DIAGNOSIS — I4510 Unspecified right bundle-branch block: Secondary | ICD-10-CM

## 2019-09-06 LAB — CUP PACEART REMOTE DEVICE CHECK
Battery Remaining Longevity: 67 mo
Battery Remaining Percentage: 63 %
Battery Voltage: 3.01 V
Brady Statistic RV Percent Paced: 1 %
Date Time Interrogation Session: 20200929060017
HighPow Impedance: 66 Ohm
HighPow Impedance: 66 Ohm
Implantable Lead Implant Date: 20160323
Implantable Lead Location: 753860
Implantable Pulse Generator Implant Date: 20160323
Lead Channel Impedance Value: 390 Ohm
Lead Channel Pacing Threshold Amplitude: 0.5 V
Lead Channel Pacing Threshold Pulse Width: 0.5 ms
Lead Channel Sensing Intrinsic Amplitude: 12 mV
Lead Channel Setting Pacing Amplitude: 2.5 V
Lead Channel Setting Pacing Pulse Width: 0.5 ms
Lead Channel Setting Sensing Sensitivity: 0.5 mV
Pulse Gen Serial Number: 7255600

## 2019-09-07 ENCOUNTER — Other Ambulatory Visit: Payer: Self-pay

## 2019-09-07 ENCOUNTER — Ambulatory Visit (INDEPENDENT_AMBULATORY_CARE_PROVIDER_SITE_OTHER): Payer: PPO | Admitting: Student

## 2019-09-07 VITALS — BP 110/60 | HR 61 | Ht 75.0 in | Wt 206.0 lb

## 2019-09-07 DIAGNOSIS — I5022 Chronic systolic (congestive) heart failure: Secondary | ICD-10-CM | POA: Diagnosis not present

## 2019-09-07 LAB — CUP PACEART INCLINIC DEVICE CHECK
Battery Remaining Longevity: 68 mo
Brady Statistic RV Percent Paced: 0.35 %
Date Time Interrogation Session: 20200930135842
HighPow Impedance: 69.75 Ohm
Implantable Lead Implant Date: 20160323
Implantable Lead Location: 753860
Implantable Pulse Generator Implant Date: 20160323
Lead Channel Impedance Value: 375 Ohm
Lead Channel Pacing Threshold Amplitude: 0.5 V
Lead Channel Pacing Threshold Amplitude: 0.5 V
Lead Channel Pacing Threshold Pulse Width: 0.5 ms
Lead Channel Pacing Threshold Pulse Width: 0.5 ms
Lead Channel Sensing Intrinsic Amplitude: 11.9 mV
Lead Channel Setting Pacing Amplitude: 2.5 V
Lead Channel Setting Pacing Pulse Width: 0.5 ms
Lead Channel Setting Sensing Sensitivity: 0.5 mV
Pulse Gen Serial Number: 7255600

## 2019-09-07 NOTE — Progress Notes (Signed)
Electrophysiology Office Note Date: 09/07/2019  ID:  Bullock, Kyle 11-12-1951, MRN [id number]  PCP: Martha Clan, MD Primary Cardiologist: No primary care provider on file. Electrophysiologist: None  CC: Routine ICD follow-up  Kyle Bullock is a 68 y.o. male seen today for Dr. Johney Frame.  They present today for routine electrophysiology followup.  Since last being seen in our clinic, the patient reports doing very well. They deny chest pain, palpitations, dyspnea, PND, orthopnea, nausea, vomiting, syncope, edema, weight gain, or early satiety.  He has not had ICD shocks. He gets lightheadedness with rapid standing several times a week, usually after sitting for prolonged periods. He does not take any extra lasix.   Device History: St. Jude Single Chamber ICD implanted 02/2015 for chronic systolic CHF History of appropriate therapy: No History of AAD therapy: No   Past Medical History:  Diagnosis Date  . Abnormal TSH    a. 11/2014: felt d/t sick euthyroid.  Marland Kitchen AICD (automatic cardioverter/defibrillator) present 02/28/2015  . CAD (coronary artery disease)    a. 11/2014: arrest/STEMI s/p Xience DES to the LAD and PTCA to the D1 [place], [place]). b. Relook cath 11/2014: no culprit, patent stent.  . CHF (congestive heart failure) (HCC)   . Dressler's syndrome (HCC)    a. 11/2014: tx with colchicine and prednisone.  . Dyslipidemia   . Dyspnea    a. 11/2014: possibly due to combo of CHF and Brilinta. Brilinta changed to Effient.  . Erectile dysfunction    a. Pt aware not to take ED med within 24 hr of NTG and vice versa.  . Hypertension   . Ischemic cardiomyopathy    a. 11/2014: EF reportedly 20% by cath and echo in [place].  Marland Kitchen NSVT (nonsustained ventricular tachycardia) (HCC) 11/16/14   a. 11/2014: admitted after discharge from STEMI admission, 18 beats 135-140bpm in ED.  Marland Kitchen Pericardial effusion    a. 11/2014.  Marland Kitchen Pericarditis dx'd 11/2014  . Pneumonia 1990's X 2  . RBBB    . Sleep apnea    suspected but not diagnosed (02/28/2015)  . STEMI (ST elevation myocardial infarction) (HCC) 11/10/2014  . Tobacco abuse   . VF (ventricular fibrillation) (HCC) 11/10/14   a. 11/2014: arrest with STEMI.   Past Surgical History:  Procedure Laterality Date  . CARDIAC CATHETERIZATION  11/19/2014  . CARDIAC DEFIBRILLATOR PLACEMENT  02/28/2015  . CORONARY ANGIOPLASTY WITH STENT PLACEMENT  11/10/2014   LAD DES  . HAND SURGERY Bilateral    Dupuytren's Contracture  . IMPLANTABLE CARDIOVERTER DEFIBRILLATOR IMPLANT N/A 02/28/2015   SJM Fortify Assura VR ICD implanted by Dr Johney Frame.  Revascularized following VF arrest, EF remained depressed and ICD implanted  . KNEE ARTHROSCOPY Right 1982  . LEFT HEART CATH N/A 11/19/2014   Procedure: LEFT HEART CATH;  Surgeon: Runell Gess, MD;  Location: [facility] C[facility] LAB;  Service: Cardiovascular;  Laterality: N/A;    Current Outpatient Medications  Medication Sig Dispense Refill  . ANORO ELLIPTA 62.5-25 MCG/INH AEPB Inhale 1 puff into the lungs daily.  11  . aspirin EC 81 MG tablet Take 81 mg by mouth daily.    Marland Kitchen atorvastatin (LIPITOR) 20 MG tablet TAKE 1 TABLET BY MOUTH DAILY AT 6 PM 90 tablet 0  . carvedilol (COREG) 6.25 MG tablet TAKE 1 TABLET BY MOUTH TWICE DAILY WITH MEAL 180 tablet 0  . clopidogrel (PLAVIX) 75 MG tablet TAKE 1 TABLET(75 MG) BY MOUTH DAILY 90 tablet 3  . furosemide (LASIX) 40 MG  tablet Take 40 mg by mouth daily. Alternating every other day 20 mg     . Icosapent Ethyl (VASCEPA PO) Take 1 g by mouth daily.    Marland Kitchen levothyroxine (SYNTHROID, LEVOTHROID) 50 MCG tablet Take 50 mcg by mouth daily before breakfast.    . nitroGLYCERIN (NITROSTAT) 0.4 MG SL tablet Place 1 tablet (0.4 mg total) under the tongue every 5 (five) minutes as needed for chest pain (MAX 3 TABLETS). Reported on 12/13/2015 10 tablet 3  . sacubitril-valsartan (ENTRESTO) 24-26 MG Take 1 tablet by mouth 2 (two) times daily. 180 tablet 1  . spironolactone (ALDACTONE) 25  MG tablet TAKE 1 TABLET(25 MG) BY MOUTH DAILY 90 tablet 1   No current facility-administered medications for this visit.    Allergies:   Ambien [zolpidem tartrate], Lisinopril, and Brilinta [ticagrelor]   Social History: Social History   Socioeconomic History  . Marital status: Married    Spouse name: Not on file  . Number of children: 2  . Years of education: Not on file  . Highest education level: Not on file  Occupational History  . Not on file  Social Needs  . Financial resource strain: Not on file  . Food insecurity    Worry: Not on file    Inability: Not on file  . Transportation needs    Medical: Not on file    Non-medical: Not on file  Tobacco Use  . Smoking status: Former Smoker    Packs/day: 2.00    Years: 40.00    Pack years: 80.00    Types: Cigarettes    Quit date: 11/10/2014    Years since quitting: 4.8  . Smokeless tobacco: Never Used  Substance and Sexual Activity  . Alcohol use: Yes    Alcohol/week: 14.0 standard drinks    Types: 14 Glasses of wine per week    Comment: 02/28/2015 "2, 4oz glasses of wine/night"  . Drug use: No  . Sexual activity: Not Currently  Lifestyle  . Physical activity    Days per week: Not on file    Minutes per session: Not on file  . Stress: Not on file  Relationships  . Social Herbalist on phone: Not on file    Gets together: Not on file    Attends religious service: Not on file    Active member of club or organization: Not on file    Attends meetings of clubs or organizations: Not on file    Relationship status: Not on file  . Intimate partner violence    Fear of current or ex partner: Not on file    Emotionally abused: Not on file    Physically abused: Not on file    Forced sexual activity: Not on file  Other Topics Concern  . 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  Now lives in [place].   Family History: Family History  Problem  Relation Age of Onset  . COPD Mother   . Hypertension Mother   . Hypertension Father   . CAD Neg Hx    Review of Systems: All other systems reviewed and are otherwise negative except as noted above.  Physical Exam: Vitals:   09/07/19 1303  BP: 110/60  Pulse: 61  Weight: 206 lb (93.4 kg)  Height: 6\' 3"  (1.905 m)    GEN- The patient is well appearing, alert and oriented x 3 today.   HEENT: normocephalic,  atraumatic; sclera clear, conjunctiva pink; hearing intact; oropharynx clear; neck supple, no JVP Lymph- no cervical lymphadenopathy Lungs- Clear to ausculation bilaterally, normal work of breathing.  No wheezes, rales, rhonchi Heart- Regular rate and rhythm, no murmurs, rubs or gallops, PMI not laterally displaced GI- soft, non-tender, non-distended, bowel sounds present, no hepatosplenomegaly Extremities- no clubbing, cyanosis, or edema; DP/PT/radial pulses 2+ bilaterally MS- no significant deformity or atrophy Skin- warm and dry, no rash or lesion; ICD pocket well healed Psych- euthymic mood, full affect Neuro- strength and sensation are intact  ICD interrogation- reviewed in detail today,  See PACEART report  EKG:  EKG is ordered today. The ekg ordered today shows NSR at 61 bpm with intermittent V pacing.  Recent Labs: No results found for requested labs within last 8760 hours.   Wt Readings from Last 3 Encounters:  09/07/19 206 lb (93.4 kg)  08/23/18 204 lb 9.6 oz (92.8 kg)  03/10/18 204 lb 4 oz (92.6 kg)     Other studies Reviewed: Additional studies/ records that were reviewed today include: Previous EP notes, most recent CHF note,  Most recent labwork, Echo 08/2017 LVEF 20-25%  Assessment and Plan:  1.  Chronic systolic dysfunction/ ICM/CAD s/p prior VF NYHA II symptoms, with no SOB. Euvolemic to slightly dry today Stable on an appropriate medical regimen Normal ICD function See Pace Art report No changes today Has previously refused ICM device clinic  Previously seen by Dr. Gala RomneyBensimhon. Will see next month.  He would be a candidate for Barostim, were he to have a change in his exercise capacity, and we discussed this today. Note consideration for VAD discussed in HF team notes; this would take precedence. So will continue to follow along with his course.   Current medicines are reviewed at length with the patient today.   The patient does not have concerns regarding his medicines.  The following changes were made today:  Lasix to 40 mg daily alternating with 20 mg daily  Labs/ tests ordered today include:  Orders Placed This Encounter  Procedures  . Basic metabolic panel  . Magnesium  . CUP PACEART INCLINIC DEVICE CHECK  . EKG 12-Lead   Disposition:   Follow up with EP APP in 1 year. Continue remote checks.   Dustin FlockSigned, Rhen Dossantos Andrew Seif Teichert, PA-C  09/07/2019 2:00 PM  [facility] [address] [place] [address] [phone] (office) [phone] (fax)

## 2019-09-07 NOTE — Patient Instructions (Addendum)
  Medication Instructions:   START TAKING LASIX 40 MG ONCE A DAY  ALTERNATING  20 MG ( HALF OF 40 MG ) EVERY OTHER DAY   If you need a refill on your cardiac medications before your next appointment, please call your pharmacy.   Lab work:  BMET MAG TODAY   If you have labs (blood work) drawn today and your tests are completely normal, you will receive your results only by: Marland Kitchen MyChart Message (if you have MyChart) OR . A paper copy in the mail If you have any lab test that is abnormal or we need to change your treatment, we will call you to review the results.  Testing/Procedures:   Follow-Up: At [facility], you and your health needs are our priority.  As part of our continuing mission to provide you with exceptional heart care, we have created designated Provider Care Teams.  These Care Teams include your primary Cardiologist (physician) and Advanced Practice Providers (APPs -  Physician Assistants and Nurse Practitioners) who all work together to provide you with the care you need, when you need it. You will need a follow up appointment in 1 years.  Please call our office 2 months in advance to schedule this appointment.  You may seeone of the following Advanced Practice Providers on your designated Care Team:   Chanetta Marshall, NP . Tommye Standard, PA-C . Joesph July PA-C   Any Other Special Instructions Will Be Listed Below (If Applicable).

## 2019-09-08 ENCOUNTER — Telehealth: Payer: Self-pay

## 2019-09-08 LAB — BASIC METABOLIC PANEL
BUN/Creatinine Ratio: 21 (ref 10–24)
BUN: 29 mg/dL — ABNORMAL HIGH (ref 8–27)
CO2: 23 mmol/L (ref 20–29)
Calcium: 9.5 mg/dL (ref 8.6–10.2)
Chloride: 102 mmol/L (ref 96–106)
Creatinine, Ser: 1.4 mg/dL — ABNORMAL HIGH (ref 0.76–1.27)
GFR calc Af Amer: 59 mL/min/{1.73_m2} — ABNORMAL LOW (ref >59)
GFR calc non Af Amer: 51 mL/min/{1.73_m2} — ABNORMAL LOW (ref >59)
Glucose: 98 mg/dL (ref 65–99)
Potassium: 5 mmol/L (ref 3.5–5.2)
Sodium: 141 mmol/L (ref 134–144)

## 2019-09-08 LAB — MAGNESIUM: Magnesium: 2.4 mg/dL — ABNORMAL HIGH (ref 1.6–2.3)

## 2019-09-08 NOTE — Telephone Encounter (Signed)
Follow up ° ° ° ° °Please return call to patient °

## 2019-09-08 NOTE — Telephone Encounter (Signed)
The patient has been notified of the result and verbalized understanding.  All questions (if any) were answered. Frederik Schmidt, RN 09/08/2019 10:08 AM

## 2019-09-08 NOTE — Telephone Encounter (Signed)
Notes recorded by Frederik Schmidt, RN on 09/08/2019 at 9:30 AM EDT  [id number] 10/1  ------

## 2019-09-08 NOTE — Telephone Encounter (Signed)
-----   Message from Shirley Friar, PA-C sent at 09/08/2019  9:15 AM EDT ----- Magnesium stable.   Cr slightly elevated, but lasix adjusted down 09/07/2019.   K borderline high. Will you please ask him to LIMIT high potassium foods such as bananas, avocados, cantaloupe, sweet potatoes, and tomatoes (especially tomato paste and puree)   Should get repeat BMET at HF visit 10/28.    Thanks!  Legrand Como [address]" [place], PA-C  09/08/2019 9:15 AM

## 2019-09-16 NOTE — Progress Notes (Signed)
Remote ICD transmission.   

## 2019-10-05 ENCOUNTER — Encounter (HOSPITAL_COMMUNITY): Payer: Self-pay | Admitting: Internal Medicine

## 2019-10-05 ENCOUNTER — Ambulatory Visit (HOSPITAL_COMMUNITY)
Admission: RE | Admit: 2019-10-05 | Discharge: 2019-10-05 | Disposition: A | Discharge status: Home / Self Care | Payer: PPO | Source: Ambulatory Visit | Attending: Internal Medicine | Admitting: Internal Medicine

## 2019-10-05 ENCOUNTER — Other Ambulatory Visit: Payer: Self-pay

## 2019-10-05 VITALS — BP 108/72 | HR 61 | Wt 207.0 lb

## 2019-10-05 DIAGNOSIS — Z955 Presence of coronary angioplasty implant and graft: Secondary | ICD-10-CM | POA: Diagnosis not present

## 2019-10-05 DIAGNOSIS — I959 Hypotension, unspecified: Secondary | ICD-10-CM | POA: Insufficient documentation

## 2019-10-05 DIAGNOSIS — E785 Hyperlipidemia, unspecified: Secondary | ICD-10-CM | POA: Diagnosis not present

## 2019-10-05 DIAGNOSIS — I255 Ischemic cardiomyopathy: Secondary | ICD-10-CM | POA: Insufficient documentation

## 2019-10-05 DIAGNOSIS — R42 Dizziness and giddiness: Secondary | ICD-10-CM | POA: Insufficient documentation

## 2019-10-05 DIAGNOSIS — Z79899 Other long term (current) drug therapy: Secondary | ICD-10-CM | POA: Insufficient documentation

## 2019-10-05 DIAGNOSIS — J439 Emphysema, unspecified: Secondary | ICD-10-CM | POA: Diagnosis not present

## 2019-10-05 DIAGNOSIS — Z95810 Presence of automatic (implantable) cardiac defibrillator: Secondary | ICD-10-CM | POA: Insufficient documentation

## 2019-10-05 DIAGNOSIS — I2510 Atherosclerotic heart disease of native coronary artery without angina pectoris: Secondary | ICD-10-CM | POA: Diagnosis not present

## 2019-10-05 DIAGNOSIS — I252 Old myocardial infarction: Secondary | ICD-10-CM | POA: Insufficient documentation

## 2019-10-05 DIAGNOSIS — Z7982 Long term (current) use of aspirin: Secondary | ICD-10-CM | POA: Diagnosis not present

## 2019-10-05 DIAGNOSIS — I5022 Chronic systolic (congestive) heart failure: Secondary | ICD-10-CM | POA: Diagnosis not present

## 2019-10-05 DIAGNOSIS — Z8249 Family history of ischemic heart disease and other diseases of the circulatory system: Secondary | ICD-10-CM | POA: Diagnosis not present

## 2019-10-05 DIAGNOSIS — I110 Hypertensive heart disease with heart failure: Secondary | ICD-10-CM | POA: Diagnosis not present

## 2019-10-05 LAB — BASIC METABOLIC PANEL
Anion gap: 11 (ref 5–15)
BUN: 25 mg/dL — ABNORMAL HIGH (ref 8–23)
CO2: 22 mmol/L (ref 22–32)
Calcium: 9.5 mg/dL (ref 8.9–10.3)
Chloride: 104 mmol/L (ref 98–111)
Creatinine, Ser: 1.44 mg/dL — ABNORMAL HIGH (ref 0.61–1.24)
GFR calc Af Amer: 57 mL/min — ABNORMAL LOW (ref >60)
GFR calc non Af Amer: 49 mL/min — ABNORMAL LOW (ref >60)
Glucose, Bld: 100 mg/dL — ABNORMAL HIGH (ref 70–99)
Potassium: 4.8 mmol/L (ref 3.5–5.1)
Sodium: 137 mmol/L (ref 135–145)

## 2019-10-05 LAB — BRAIN NATRIURETIC PEPTIDE: B Natriuretic Peptide: 478.7 pg/mL — ABNORMAL HIGH (ref 0.0–100.0)

## 2019-10-05 MED ORDER — FARXIGA 10 MG PO TABS: 10.0000 mg | ORAL_TABLET | Freq: Every day | ORAL | 11 refills | Status: DC

## 2019-10-05 MED ORDER — FUROSEMIDE 40 MG PO TABS: 20.0000 mg | ORAL_TABLET | Freq: Every day | ORAL | 11 refills | Status: DC

## 2019-10-05 NOTE — Patient Instructions (Signed)
DECREASE Lasix to 20 mg, one half tab daily START Farxiga 10 mg, one tab daily  Labs today We will only contact you if something comes back abnormal or we need to make some changes. Otherwise no news is good news!  Your physician recommends that you schedule a follow-up appointment in: 6 months with Dr Haroldine Laws and echo  Your physician has requested that you have an echocardiogram. Echocardiography is a painless test that uses sound waves to create images of your heart. It provides your doctor with information about the size and shape of your heart and how well your heart's chambers and valves are working. This procedure takes approximately one hour. There are no restrictions for this procedure.  Do the following things EVERYDAY: 1) Weigh yourself in the morning before breakfast. Write it down and keep it in a log. 2) Take your medicines as prescribed 3) Eat low salt foods-Limit salt (sodium) to 2000 mg per day.  4) Stay as active as you can everyday 5) Limit all fluids for the day to less than 2 liters   At the [facility], you and your health needs are our priority. As part of our continuing mission to provide you with exceptional heart care, we have created designated Provider Care Teams. These Care Teams include your primary Cardiologist (physician) and Advanced Practice Providers (APPs- Physician Assistants and Nurse Practitioners) who all work together to provide you with the care you need, when you need it.   You may see any of the following providers on your designated Care Team at your next follow up: Marland Kitchen Dr Glori Bickers . Dr Loralie Champagne . Darrick Grinder, NP . Lyda Jester, PA   Please be sure to bring in all your medications bottles to every appointment.

## 2019-10-05 NOTE — Progress Notes (Signed)
Patient ID: Kyle Bullock, male   DOB: [birth year], 68 y.o.   MRN: [id number]    Advanced Heart Failure Clinic Note    History of Present Illness: Kyle Bullock is a 68 y.o. male who is referred to the HF Clinic by Dr. Burt Knack. He has h/o CAD and ischemic cardiomyopathy 20-25%.  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    In December 2015, he suffered an acute anterolateral MI complicated by VF arrest in [place]. He underwent drug-eluting stent placement to the LAD and PTCA of the diagonal. EF was 25% and a life vest was placed prior to discharge. Following return to [place], he developed dyspnea and presented to [facility] where he was readmitted. His Brilinta was switched to Effient as it was felt the Brilinta may have been playing a role in his dyspnea. He was also switched from an ACE inhibitor to an ARB. He was seen by electrophysiology with recommendation for ongoing life vest therapy and follow-up echo in 90 days post revascularization. He was then seen in clinic in mid December where he reported pleuritic chest pain. He was readmitted and underwent diagnostic catheterization revealing patency of previously placed stent and area of balloon angioplasty in the diagonal. EF was 20-25% with diffuse hypokinesis and normal pericardial appearance. He did have some hypotension requiring discontinuation of ARB therapy. His beta blocker was also down titrated. He was diagnosed with Dressler's syndrome and was placed on prednisone taper and colchicine.   CT with COPD. Referred to Dr. Lake Bells who felt he had mild COPD but no ILD. Given PRN albuterol. Has repeat CT scheduled for 1 year.   He returns today for regular follow up. Feels pretty good. Walking 2.5-3.0 miles per day. Feels like he is walking a little slower than last year but still under 60 mins for 3 miles. Has gained some weight. No CP, orthopnea or PND. BP usually in 90s. Occasionally light-headed. Taking lasix 40  alternating with 20 daily.   ICD No VT/AF. Fluid status up and down. Personally reviewed   Echo 3/16 EF 25-30% Echo 03/2016 EF 20-25% Echo 9/18 EF 20-25% RV normal   CPX 2/18 FVC 3.49 (72%)    FEV1 2.49 (68%)     FEV1/FVC 71 (93%)     MVV 117 (82%) Resting HR: 62 Peak HR: 148  (97% age predicted max HR) BP rest: 84/60 BP peak: 156/62 Peak VO2: 19.6 (69% predicted peak VO2) VE/VCO2 slope: 37 OUES: 2.14 Peak RER: 1.06 Ventilatory Threshold: 17.4 (61% predicted or measured peak VO2) VE/MVV: 66% O2pulse: 12  (71% predicted O2pulse)  CPX 05/14/2015  Peak VO2: 19.0 (64.5% predicted peak VO2) VE/VCO2 slope: 41.5 OUES: 2.01 Peak RER: 1.06 Ventilatory Threshold: 14.1     (47.9% predicted or measured peak VO2)   Past Medical History:  Diagnosis Date  . Abnormal TSH    a. 11/2014: felt d/t sick euthyroid.  Marland Kitchen AICD (automatic cardioverter/defibrillator) present 02/28/2015  . CAD (coronary artery disease)    a. 11/2014: arrest/STEMI s/p Xience DES to the LAD and PTCA to the D1 ([place]). b. Relook cath 11/2014: no culprit, patent stent.  . CHF (congestive heart failure) ([place])   . Dressler's syndrome ([place])    a. 11/2014: tx with colchicine and prednisone.  . Dyslipidemia   . Dyspnea    a. 11/2014: possibly due to combo of CHF and Brilinta. Brilinta changed to Effient.  . Erectile dysfunction    a. Pt aware not  to take ED med within 24 hr of NTG and vice versa.  . Hypertension   . Ischemic cardiomyopathy    a. 11/2014: EF reportedly 20% by cath and echo in [place].  Marland Kitchen NSVT (nonsustained ventricular tachycardia) (HCC) 11/16/14   a. 11/2014: admitted after discharge from STEMI admission, 18 beats 135-140bpm in ED.  Marland Kitchen Pericardial effusion    a. 11/2014.  Marland Kitchen Pericarditis dx'd 11/2014  . Pneumonia 1990's X 2  . RBBB   . Sleep apnea    suspected but not diagnosed (02/28/2015)  . STEMI (ST elevation myocardial infarction) (HCC) 11/10/2014  . Tobacco abuse   .  VF (ventricular fibrillation) (HCC) 11/10/14   a. 11/2014: arrest with STEMI.    Past Surgical History:  Procedure Laterality Date  . CARDIAC CATHETERIZATION  11/19/2014  . CARDIAC DEFIBRILLATOR PLACEMENT  02/28/2015  . CORONARY ANGIOPLASTY WITH STENT PLACEMENT  11/10/2014   LAD DES  . HAND SURGERY Bilateral    Dupuytren's Contracture  . IMPLANTABLE CARDIOVERTER DEFIBRILLATOR IMPLANT N/A 02/28/2015   SJM Fortify Assura VR ICD implanted by Dr Johney Frame.  Revascularized following VF arrest, EF remained depressed and ICD implanted  . KNEE ARTHROSCOPY Right 1982  . LEFT HEART CATH N/A 11/19/2014   Procedure: LEFT HEART CATH;  Surgeon: Runell Gess, MD;  Location: [facility] CATH LAB;  Service: Cardiovascular;  Laterality: N/A;    Current Outpatient Medications  Medication Sig Dispense Refill  . ANORO ELLIPTA 62.5-25 MCG/INH AEPB Inhale 1 puff into the lungs daily.  11  . aspirin EC 81 MG tablet Take 81 mg by mouth daily.    Marland Kitchen atorvastatin (LIPITOR) 20 MG tablet TAKE 1 TABLET BY MOUTH DAILY AT 6 PM 90 tablet 0  . carvedilol (COREG) 6.25 MG tablet TAKE 1 TABLET BY MOUTH TWICE DAILY WITH MEAL 180 tablet 0  . clopidogrel (PLAVIX) 75 MG tablet TAKE 1 TABLET(75 MG) BY MOUTH DAILY 90 tablet 3  . furosemide (LASIX) 40 MG tablet Take 40 mg by mouth daily. Alternating every other day 20 mg     . Icosapent Ethyl (VASCEPA PO) Take 1 g by mouth daily.    Marland Kitchen levothyroxine (SYNTHROID, LEVOTHROID) 50 MCG tablet Take 50 mcg by mouth daily before breakfast.    . nitroGLYCERIN (NITROSTAT) 0.4 MG SL tablet Place 1 tablet (0.4 mg total) under the tongue every 5 (five) minutes as needed for chest pain (MAX 3 TABLETS). Reported on 12/13/2015 10 tablet 3  . sacubitril-valsartan (ENTRESTO) 24-26 MG Take 1 tablet by mouth 2 (two) times daily. 180 tablet 1  . spironolactone (ALDACTONE) 25 MG tablet TAKE 1 TABLET(25 MG) BY MOUTH DAILY 90 tablet 1   No current facility-administered medications for this encounter.      Allergies:   Ambien [zolpidem tartrate], Lisinopril, and Brilinta [ticagrelor]   Social History:  The patient  reports that he quit smoking about 4 years ago. His smoking use included cigarettes. He has a 80.00 pack-year smoking history. He has never used smokeless tobacco. He reports current alcohol use of about 14.0 standard drinks of alcohol per week. He reports that he does not use drugs.   Family History:  The patient's  family history includes COPD in his mother; Hypertension in his father and mother.   Review of systems complete and found to be negative unless listed in HPI.     Vitals:   10/05/19 1209  BP: 108/72  Pulse: 61  SpO2: 97%  Weight: 93.9 kg (207 lb)    Wt  Readings from Last 3 Encounters:  10/05/19 93.9 kg (207 lb)  09/07/19 93.4 kg (206 lb)  08/23/18 92.8 kg (204 lb 9.6 oz)   Physical exam: General:  Well appearing. No resp difficulty HEENT: normal Neck: supple. no JVD. Carotids 2+ bilat; no bruits. No lymphadenopathy or thryomegaly appreciated. Cor: PMI nondisplaced. Regular rate & rhythm. No rubs, gallops or murmurs. Lungs: clear Abdomen: soft, nontender, nondistended. No hepatosplenomegaly. No bruits or masses. Good bowel sounds. Extremities: no cyanosis, clubbing, rash, edema Neuro: alert & orientedx3, cranial nerves grossly intact. moves all 4 extremities w/o difficulty. Affect pleasant    Recent Labs: 09/07/2019: BUN 29; Creatinine, Ser 1.40; Magnesium 2.4; Potassium 5.0; Sodium 141   Lipid Panel     Component Value Date/Time   CHOL 108 01/08/2015 0850   CHOL 101 12/04/2014 1030   TRIG 155.0 (H) 01/08/2015 0850   HDL 39.00 (L) 01/08/2015 0850   HDL 43 12/04/2014 1030   CHOLHDL 3 01/08/2015 0850   VLDL 31.0 01/08/2015 0850   LDLCALC 38 01/08/2015 0850   LDLCALC 39 12/04/2014 1030     ASSESSMENT AND PLAN:  1. Chronic systolic due to ischemic CM  - Echo 03/2016 LVEF 20-25%, Grade 2 DD - Echo 3/18 shows with stable LVEF 20-25%. Normal RV.  -  Echo 9/18 EF 20-25%, grade 2 DD  - Doing well. Stable NYHA class II symptoms. Discussed possibility of repeating CPX but with stable symptoms would not pursue at this time - Volume status stable on lasix 20 mg daily.   - Continue Entresto 24/26.  Up-titration Limited by hypotension.  - Continue carvedilol 6.25 mg BID.  - Continue sprio 25 mg daily.   - Add Farxiga 10 - If symptoms progress, low threshold to repeat CPX - Due for repeat echo 2.  CAD s/p Anterior MI December 2015:  - No s/s ischemia i - Continue ASA, statin and Effient Dr. Excell Seltzerooper  - Lipids per Dr. Eric Formoug Shaw. Last LDL < 56 (1/19) Goal LDL < 70 3. Tobacco abuse:  - Remains quit 4. Emphysema - Sees pulmonary. Spirometry on CPX previously reviewed with only mild disease.     Arvilla Meresaniel Nahshon Reich, MD  12:51 PM

## 2019-10-07 ENCOUNTER — Telehealth (HOSPITAL_COMMUNITY): Payer: Self-pay | Admitting: Pharmacist

## 2019-10-07 MED ORDER — EMPAGLIFLOZIN 10 MG PO TABS: 10.0000 mg | ORAL_TABLET | Freq: Every day | ORAL | 11 refills | Status: DC

## 2019-10-07 NOTE — Telephone Encounter (Signed)
Received notification that new Farxiga medication is not covered by Mr. Rosselli' insurance. Vania Rea is preferred, with a copay of $25.59/month. After speaking with Dr. Haroldine Laws, will change him to Jardiance 10 mg daily. New prescription sent to [facility] Patient aware.   Audry Riles, PharmD, BCPS, CPP Heart Failure Clinic Pharmacist [phone]

## 2019-10-28 ENCOUNTER — Other Ambulatory Visit (HOSPITAL_COMMUNITY): Payer: Self-pay

## 2019-10-28 MED ORDER — CARVEDILOL 6.25 MG PO TABS: ORAL_TABLET | ORAL | 0 refills | Status: DC

## 2019-11-21 ENCOUNTER — Other Ambulatory Visit (HOSPITAL_COMMUNITY): Payer: Self-pay

## 2019-11-21 MED ORDER — ATORVASTATIN CALCIUM 20 MG PO TABS: ORAL_TABLET | ORAL | 0 refills | Status: DC

## 2019-12-06 ENCOUNTER — Ambulatory Visit (INDEPENDENT_AMBULATORY_CARE_PROVIDER_SITE_OTHER): Payer: PPO | Admitting: *Deleted

## 2019-12-06 DIAGNOSIS — I5022 Chronic systolic (congestive) heart failure: Secondary | ICD-10-CM

## 2019-12-06 LAB — CUP PACEART REMOTE DEVICE CHECK
Battery Remaining Longevity: 65 mo
Battery Remaining Percentage: 61 %
Battery Voltage: 3.01 V
Brady Statistic RV Percent Paced: 1 %
Date Time Interrogation Session: 20201229020026
HighPow Impedance: 70 Ohm
HighPow Impedance: 70 Ohm
Implantable Lead Implant Date: 20160323
Implantable Lead Location: 753860
Implantable Pulse Generator Implant Date: 20160323
Lead Channel Impedance Value: 360 Ohm
Lead Channel Pacing Threshold Amplitude: 0.5 V
Lead Channel Pacing Threshold Pulse Width: 0.5 ms
Lead Channel Sensing Intrinsic Amplitude: 11.9 mV
Lead Channel Setting Pacing Amplitude: 2.5 V
Lead Channel Setting Pacing Pulse Width: 0.5 ms
Lead Channel Setting Sensing Sensitivity: 0.5 mV
Pulse Gen Serial Number: 7255600

## 2019-12-28 ENCOUNTER — Ambulatory Visit: Payer: PPO | Attending: Internal Medicine

## 2019-12-28 DIAGNOSIS — Z23 Encounter for immunization: Secondary | ICD-10-CM | POA: Insufficient documentation

## 2019-12-28 DIAGNOSIS — M109 Gout, unspecified: Secondary | ICD-10-CM | POA: Diagnosis not present

## 2019-12-28 DIAGNOSIS — E038 Other specified hypothyroidism: Secondary | ICD-10-CM | POA: Diagnosis not present

## 2019-12-28 DIAGNOSIS — Z125 Encounter for screening for malignant neoplasm of prostate: Secondary | ICD-10-CM | POA: Diagnosis not present

## 2019-12-28 DIAGNOSIS — E7849 Other hyperlipidemia: Secondary | ICD-10-CM | POA: Diagnosis not present

## 2019-12-28 NOTE — Progress Notes (Signed)
   Covid-19 Vaccination Clinic  Name:  ELERY CADENHEAD    MRN: [id number] DOB: [birth year]  12/28/2019  Mr. Sachse was observed post Covid-19 immunization for 30 minutes based on pre-vaccination screening without incidence. He was provided with Vaccine Information Sheet and instruction to access the V-Safe system.   Mr. Huot was instructed to call 911 with any severe reactions post vaccine: Marland Kitchen Difficulty breathing  . Swelling of your face and throat  . A fast heartbeat  . A bad rash all over your body  . Dizziness and weakness    Immunizations Administered    Name Date Dose VIS Date Route   Pfizer COVID-19 Vaccine 12/28/2019  4:21 PM 0.3 mL 11/18/2019 Intramuscular   Manufacturer: [employer], [employer]   Lot: [id number]   NDC: [address]-7

## 2020-01-01 DIAGNOSIS — E038 Other specified hypothyroidism: Secondary | ICD-10-CM | POA: Diagnosis not present

## 2020-01-02 DIAGNOSIS — I10 Essential (primary) hypertension: Secondary | ICD-10-CM | POA: Diagnosis not present

## 2020-01-02 DIAGNOSIS — R82998 Other abnormal findings in urine: Secondary | ICD-10-CM | POA: Diagnosis not present

## 2020-01-03 DIAGNOSIS — K921 Melena: Secondary | ICD-10-CM | POA: Diagnosis not present

## 2020-01-04 DIAGNOSIS — E039 Hypothyroidism, unspecified: Secondary | ICD-10-CM | POA: Diagnosis not present

## 2020-01-04 DIAGNOSIS — Z0000 Encounter for general adult medical examination without abnormal findings: Secondary | ICD-10-CM | POA: Diagnosis not present

## 2020-01-04 DIAGNOSIS — I110 Hypertensive heart disease with heart failure: Secondary | ICD-10-CM | POA: Diagnosis not present

## 2020-01-04 DIAGNOSIS — M109 Gout, unspecified: Secondary | ICD-10-CM | POA: Diagnosis not present

## 2020-01-04 DIAGNOSIS — I700 Atherosclerosis of aorta: Secondary | ICD-10-CM | POA: Diagnosis not present

## 2020-01-04 DIAGNOSIS — H9193 Unspecified hearing loss, bilateral: Secondary | ICD-10-CM | POA: Diagnosis not present

## 2020-01-04 DIAGNOSIS — J432 Centrilobular emphysema: Secondary | ICD-10-CM | POA: Diagnosis not present

## 2020-01-04 DIAGNOSIS — R7301 Impaired fasting glucose: Secondary | ICD-10-CM | POA: Diagnosis not present

## 2020-01-04 DIAGNOSIS — E785 Hyperlipidemia, unspecified: Secondary | ICD-10-CM | POA: Diagnosis not present

## 2020-01-04 DIAGNOSIS — Z1331 Encounter for screening for depression: Secondary | ICD-10-CM | POA: Diagnosis not present

## 2020-01-04 DIAGNOSIS — E781 Pure hyperglyceridemia: Secondary | ICD-10-CM | POA: Diagnosis not present

## 2020-01-04 DIAGNOSIS — I5022 Chronic systolic (congestive) heart failure: Secondary | ICD-10-CM | POA: Diagnosis not present

## 2020-01-04 DIAGNOSIS — Z9861 Coronary angioplasty status: Secondary | ICD-10-CM | POA: Diagnosis not present

## 2020-01-04 DIAGNOSIS — Z1339 Encounter for screening examination for other mental health and behavioral disorders: Secondary | ICD-10-CM | POA: Diagnosis not present

## 2020-01-10 ENCOUNTER — Other Ambulatory Visit: Payer: Self-pay | Admitting: Internal Medicine

## 2020-01-10 DIAGNOSIS — F17201 Nicotine dependence, unspecified, in remission: Secondary | ICD-10-CM

## 2020-01-16 ENCOUNTER — Ambulatory Visit: Payer: PPO | Attending: Internal Medicine

## 2020-01-16 DIAGNOSIS — Z23 Encounter for immunization: Secondary | ICD-10-CM | POA: Insufficient documentation

## 2020-01-16 NOTE — Progress Notes (Signed)
   Covid-19 Vaccination Clinic  Name:  Kyle Bullock    MRN: [id number] DOB: 07-Mar-1951  01/16/2020  [name] was observed post Covid-19 immunization for 15 minutes without incidence. He was provided with Vaccine Information Sheet and instruction to access the V-Safe system.   Mr. Blades was instructed to call 911 with any severe reactions post vaccine: Marland Kitchen Difficulty breathing  . Swelling of your face and throat  . A fast heartbeat  . A bad rash all over your body  . Dizziness and weakness    Immunizations Administered    Name Date Dose VIS Date Route   Pfizer COVID-19 Vaccine 01/16/2020 11:49 AM 0.3 mL 11/18/2019 Intramuscular   Manufacturer: [employer], [employer]   Lot: [id number]   NDC: [address]-1

## 2020-01-24 ENCOUNTER — Other Ambulatory Visit (HOSPITAL_COMMUNITY): Payer: Self-pay | Admitting: *Deleted

## 2020-01-24 MED ORDER — CARVEDILOL 6.25 MG PO TABS: ORAL_TABLET | ORAL | 3 refills | Status: DC

## 2020-02-06 ENCOUNTER — Ambulatory Visit
Admission: RE | Admit: 2020-02-06 | Discharge: 2020-02-06 | Disposition: A | Discharge status: Home / Self Care | Payer: PPO | Source: Ambulatory Visit | Attending: Internal Medicine | Admitting: Internal Medicine

## 2020-02-06 DIAGNOSIS — Z87891 Personal history of nicotine dependence: Secondary | ICD-10-CM | POA: Diagnosis not present

## 2020-02-06 DIAGNOSIS — F17201 Nicotine dependence, unspecified, in remission: Secondary | ICD-10-CM

## 2020-02-20 ENCOUNTER — Other Ambulatory Visit (HOSPITAL_COMMUNITY): Payer: Self-pay | Admitting: *Deleted

## 2020-02-20 MED ORDER — ATORVASTATIN CALCIUM 20 MG PO TABS: ORAL_TABLET | ORAL | 0 refills | Status: DC

## 2020-02-20 MED ORDER — CLOPIDOGREL BISULFATE 75 MG PO TABS: ORAL_TABLET | ORAL | 3 refills | Status: DC

## 2020-02-24 ENCOUNTER — Other Ambulatory Visit (HOSPITAL_COMMUNITY): Payer: Self-pay | Admitting: *Deleted

## 2020-02-24 ENCOUNTER — Other Ambulatory Visit (HOSPITAL_COMMUNITY): Payer: Self-pay

## 2020-02-24 MED ORDER — FUROSEMIDE 20 MG PO TABS: 20.0000 mg | ORAL_TABLET | Freq: Every day | ORAL | 3 refills | Status: DC

## 2020-02-24 MED ORDER — ENTRESTO 24-26 MG PO TABS: 1.0000 | ORAL_TABLET | Freq: Two times a day (BID) | ORAL | 1 refills | Status: DC

## 2020-03-06 ENCOUNTER — Ambulatory Visit (INDEPENDENT_AMBULATORY_CARE_PROVIDER_SITE_OTHER): Payer: PPO | Admitting: *Deleted

## 2020-03-06 DIAGNOSIS — I5022 Chronic systolic (congestive) heart failure: Secondary | ICD-10-CM

## 2020-03-06 LAB — CUP PACEART REMOTE DEVICE CHECK
Battery Remaining Longevity: 62 mo
Battery Remaining Percentage: 58 %
Battery Voltage: 3.01 V
Brady Statistic RV Percent Paced: 1 %
Date Time Interrogation Session: 20210330020016
HighPow Impedance: 65 Ohm
HighPow Impedance: 65 Ohm
Implantable Lead Implant Date: 20160323
Implantable Lead Location: 753860
Implantable Pulse Generator Implant Date: 20160323
Lead Channel Impedance Value: 360 Ohm
Lead Channel Pacing Threshold Amplitude: 0.5 V
Lead Channel Pacing Threshold Pulse Width: 0.5 ms
Lead Channel Sensing Intrinsic Amplitude: 11.9 mV
Lead Channel Setting Pacing Amplitude: 2.5 V
Lead Channel Setting Pacing Pulse Width: 0.5 ms
Lead Channel Setting Sensing Sensitivity: 0.5 mV
Pulse Gen Serial Number: 7255600

## 2020-03-06 NOTE — Progress Notes (Signed)
ICD remote 

## 2020-03-21 DIAGNOSIS — H903 Sensorineural hearing loss, bilateral: Secondary | ICD-10-CM | POA: Diagnosis not present

## 2020-04-25 ENCOUNTER — Other Ambulatory Visit (HOSPITAL_COMMUNITY): Payer: Self-pay

## 2020-04-25 DIAGNOSIS — I4510 Unspecified right bundle-branch block: Secondary | ICD-10-CM

## 2020-04-25 DIAGNOSIS — I5022 Chronic systolic (congestive) heart failure: Secondary | ICD-10-CM

## 2020-04-25 DIAGNOSIS — I255 Ischemic cardiomyopathy: Secondary | ICD-10-CM

## 2020-04-25 DIAGNOSIS — I10 Essential (primary) hypertension: Secondary | ICD-10-CM

## 2020-04-25 MED ORDER — SPIRONOLACTONE 25 MG PO TABS: 25.0000 mg | ORAL_TABLET | Freq: Every day | ORAL | 1 refills | Status: DC

## 2020-05-16 ENCOUNTER — Ambulatory Visit (HOSPITAL_BASED_OUTPATIENT_CLINIC_OR_DEPARTMENT_OTHER)
Admission: RE | Admit: 2020-05-16 | Discharge: 2020-05-16 | Disposition: A | Discharge status: Home / Self Care | Payer: PPO | Source: Ambulatory Visit | Attending: Internal Medicine | Admitting: Internal Medicine

## 2020-05-16 ENCOUNTER — Ambulatory Visit (HOSPITAL_COMMUNITY)
Admission: RE | Admit: 2020-05-16 | Discharge: 2020-05-16 | Disposition: A | Discharge status: Home / Self Care | Payer: PPO | Source: Ambulatory Visit | Attending: Internal Medicine | Admitting: Internal Medicine

## 2020-05-16 ENCOUNTER — Other Ambulatory Visit: Payer: Self-pay

## 2020-05-16 ENCOUNTER — Encounter (HOSPITAL_COMMUNITY): Payer: Self-pay | Admitting: Internal Medicine

## 2020-05-16 VITALS — BP 92/58 | HR 72 | Ht 75.0 in | Wt 207.4 lb

## 2020-05-16 DIAGNOSIS — Z7901 Long term (current) use of anticoagulants: Secondary | ICD-10-CM | POA: Diagnosis not present

## 2020-05-16 DIAGNOSIS — F17210 Nicotine dependence, cigarettes, uncomplicated: Secondary | ICD-10-CM | POA: Insufficient documentation

## 2020-05-16 DIAGNOSIS — I2510 Atherosclerotic heart disease of native coronary artery without angina pectoris: Secondary | ICD-10-CM | POA: Insufficient documentation

## 2020-05-16 DIAGNOSIS — I5022 Chronic systolic (congestive) heart failure: Secondary | ICD-10-CM

## 2020-05-16 DIAGNOSIS — Z951 Presence of aortocoronary bypass graft: Secondary | ICD-10-CM | POA: Insufficient documentation

## 2020-05-16 DIAGNOSIS — Z7982 Long term (current) use of aspirin: Secondary | ICD-10-CM | POA: Insufficient documentation

## 2020-05-16 DIAGNOSIS — I255 Ischemic cardiomyopathy: Secondary | ICD-10-CM | POA: Insufficient documentation

## 2020-05-16 DIAGNOSIS — Z79899 Other long term (current) drug therapy: Secondary | ICD-10-CM | POA: Insufficient documentation

## 2020-05-16 DIAGNOSIS — I252 Old myocardial infarction: Secondary | ICD-10-CM | POA: Diagnosis not present

## 2020-05-16 DIAGNOSIS — Z955 Presence of coronary angioplasty implant and graft: Secondary | ICD-10-CM | POA: Diagnosis not present

## 2020-05-16 DIAGNOSIS — I110 Hypertensive heart disease with heart failure: Secondary | ICD-10-CM | POA: Insufficient documentation

## 2020-05-16 DIAGNOSIS — E785 Hyperlipidemia, unspecified: Secondary | ICD-10-CM | POA: Insufficient documentation

## 2020-05-16 DIAGNOSIS — Z95810 Presence of automatic (implantable) cardiac defibrillator: Secondary | ICD-10-CM | POA: Diagnosis not present

## 2020-05-16 DIAGNOSIS — J449 Chronic obstructive pulmonary disease, unspecified: Secondary | ICD-10-CM | POA: Insufficient documentation

## 2020-05-16 DIAGNOSIS — Z7902 Long term (current) use of antithrombotics/antiplatelets: Secondary | ICD-10-CM | POA: Diagnosis not present

## 2020-05-16 DIAGNOSIS — Z8249 Family history of ischemic heart disease and other diseases of the circulatory system: Secondary | ICD-10-CM | POA: Insufficient documentation

## 2020-05-16 MED ORDER — PERFLUTREN LIPID MICROSPHERE
1.0000 mL | INTRAVENOUS | Status: DC | PRN
  Administered 2020-05-16: 2 mL via INTRAVENOUS
  Filled 2020-05-16: qty 10

## 2020-05-16 NOTE — Progress Notes (Addendum)
Patient ID: Kyle Bullock, male   DOB: 1951-06-21, 69 y.o.   MRN: [id number]    Advanced Heart Failure Clinic Note    History of Present Illness: Kyle Bullock is a 69 y.o. male who is referred to the HF Clinic by Dr. Excell Seltzer. He has h/o CAD and ischemic cardiomyopathy 20-25%.  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    In December 2015, he suffered an acute anterolateral MI complicated by VF arrest in . He underwent drug-eluting stent placement to the LAD and PTCA of the diagonal. EF was 25% and a life vest was placed prior to discharge. Following return to [place], he developed dyspnea and presented to [facility] where he was readmitted. His Brilinta was switched to Effient as it was felt the Brilinta may have been playing a role in his dyspnea. He was also switched from an ACE inhibitor to an ARB. He was seen by electrophysiology with recommendation for ongoing life vest therapy and follow-up echo in 90 days post revascularization. He was then seen in clinic in mid December where he reported pleuritic chest pain. He was readmitted and underwent diagnostic catheterization revealing patency of previously placed stent and area of balloon angioplasty in the diagonal. EF was 20-25% with diffuse hypokinesis and normal pericardial appearance. He did have some hypotension requiring discontinuation of ARB therapy. His beta blocker was also down titrated. He was diagnosed with Dressler's syndrome and was placed on prednisone taper and colchicine.   CT with COPD. Referred to Dr. Kendrick Fries who felt he had mild COPD but no ILD. Given PRN albuterol. Has repeat CT scheduled for 1 year.   He returns today for regular follow up. Here with his wife. Feels pretty good. Walking 2.5-3 miles 5 days a week. Also playing pickle ball. Denies SOB, orthopnea or PND. No problems with meds.   Echo today (06/05/20) EF 25% Personally reviewed   ICD interrogated personally. No VT/AF. Volume ok  Personally reviewed.   Echo 3/16 EF 25-30% Echo 03/2016 EF 20-25% Echo 9/18 EF 20-25% RV normal   CPX 2/18 FVC 3.49 (72%)    FEV1 2.49 (68%)     FEV1/FVC 71 (93%)     MVV 117 (82%) Resting HR: 62 Peak HR: 148  (97% age predicted max HR) BP rest: 84/60 BP peak: 156/62 Peak VO2: 19.6 (69% predicted peak VO2) VE/VCO2 slope: 37 OUES: 2.14 Peak RER: 1.06 Ventilatory Threshold: 17.4 (61% predicted or measured peak VO2) VE/MVV: 66% O2pulse: 12  (71% predicted O2pulse)  CPX 05/14/2015  Peak VO2: 19.0 (64.5% predicted peak VO2) VE/VCO2 slope: 41.5 OUES: 2.01 Peak RER: 1.06 Ventilatory Threshold: 14.1     (47.9% predicted or measured peak VO2)   Past Medical History:  Diagnosis Date  . Abnormal TSH    a. 11/2014: felt d/t sick euthyroid.  Marland Kitchen AICD (automatic cardioverter/defibrillator) present 02/28/2015  . CAD (coronary artery disease)    a. 11/2014: arrest/STEMI s/p Xience DES to the LAD and PTCA to the D1 [place], [place]). b. Relook cath 11/2014: no culprit, patent stent.  . CHF (congestive heart failure) (HCC)   . Dressler's syndrome (HCC)    a. 11/2014: tx with colchicine and prednisone.  . Dyslipidemia   . Dyspnea    a. 11/2014: possibly due to combo of CHF and Brilinta. Brilinta changed to Effient.  . Erectile dysfunction    a. Pt aware not to take ED med within 24 hr of NTG and vice versa.  Marland Kitchen  Hypertension   . Ischemic cardiomyopathy    a. 11/2014: EF reportedly 20% by cath and echo in [place].  Marland Kitchen NSVT (nonsustained ventricular tachycardia) (HCC) 11/16/14   a. 11/2014: admitted after discharge from STEMI admission, 18 beats 135-140bpm in ED.  Marland Kitchen Pericardial effusion    a. 11/2014.  Marland Kitchen Pericarditis dx'd 11/2014  . Pneumonia 1990's X 2  . RBBB   . Sleep apnea    suspected but not diagnosed (02/28/2015)  . STEMI (ST elevation myocardial infarction) (HCC) 11/10/2014  . Tobacco abuse   . VF (ventricular fibrillation) (HCC) 11/10/14   a. 11/2014: arrest  with STEMI.    Past Surgical History:  Procedure Laterality Date  . CARDIAC CATHETERIZATION  11/19/2014  . CARDIAC DEFIBRILLATOR PLACEMENT  02/28/2015  . CORONARY ANGIOPLASTY WITH STENT PLACEMENT  11/10/2014   LAD DES  . HAND SURGERY Bilateral    Dupuytren's Contracture  . IMPLANTABLE CARDIOVERTER DEFIBRILLATOR IMPLANT N/A 02/28/2015   SJM Fortify Assura VR ICD implanted by Dr Johney Frame.  Revascularized following VF arrest, EF remained depressed and ICD implanted  . KNEE ARTHROSCOPY Right 1982  . LEFT HEART CATH N/A 11/19/2014   Procedure: LEFT HEART CATH;  Surgeon: Runell Gess, MD;  Location: [facility] CATH LAB;  Service: Cardiovascular;  Laterality: N/A;    Current Outpatient Medications  Medication Sig Dispense Refill  . ANORO ELLIPTA 62.5-25 MCG/INH AEPB Inhale 1 puff into the lungs daily.  11  . aspirin EC 81 MG tablet Take 81 mg by mouth daily.    Marland Kitchen atorvastatin (LIPITOR) 20 MG tablet TAKE 1 TABLET BY MOUTH DAILY AT 6 PM 90 tablet 0  . carvedilol (COREG) 6.25 MG tablet TAKE 1 TABLET BY MOUTH TWICE DAILY WITH MEAL 180 tablet 3  . clopidogrel (PLAVIX) 75 MG tablet TAKE 1 TABLET(75 MG) BY MOUTH DAILY 90 tablet 3  . colchicine 0.6 MG tablet Take 0.6 mg by mouth daily as needed.    . empagliflozin (JARDIANCE) 10 MG TABS tablet Take 10 mg by mouth daily before breakfast. 30 tablet 11  . furosemide (LASIX) 20 MG tablet Take 1 tablet (20 mg total) by mouth daily. 90 tablet 3  . levothyroxine (SYNTHROID) 75 MCG tablet Take 75 mcg by mouth daily.    . nitroGLYCERIN (NITROSTAT) 0.4 MG SL tablet Place 1 tablet (0.4 mg total) under the tongue every 5 (five) minutes as needed for chest pain (MAX 3 TABLETS). Reported on 12/13/2015 10 tablet 3  . sacubitril-valsartan (ENTRESTO) 24-26 MG Take 1 tablet by mouth 2 (two) times daily. 180 tablet 1  . spironolactone (ALDACTONE) 25 MG tablet Take 1 tablet (25 mg total) by mouth daily. Please schedule an appointment 90 tablet 1  . VASCEPA 1 g capsule Take 2 g by  mouth 2 (two) times daily.     No current facility-administered medications for this encounter.   Facility-Administered Medications Ordered in Other Encounters  Medication Dose Route Frequency Provider Last Rate Last Admin  . perflutren lipid microspheres (DEFINITY) IV suspension  1-10 mL Intravenous PRN Saysha Menta, Bevelyn Buckles, MD   2 mL at 05/16/20 1505    Allergies:   Ambien [zolpidem tartrate], Lisinopril, and Brilinta [ticagrelor]   Social History:  The patient  reports that he quit smoking about 5 years ago. His smoking use included cigarettes. He has a 80.00 pack-year smoking history. He has never used smokeless tobacco. He reports current alcohol use of about 14.0 standard drinks of alcohol per week. He reports that he does not use  drugs.   Family History:  The patient's  family history includes COPD in his mother; Hypertension in his father and mother.   Review of systems complete and found to be negative unless listed in HPI.     Vitals:   05/16/20 1518  BP: (!) 92/58  Pulse: 72  SpO2: 94%  Weight: 94.1 kg (207 lb 6.4 oz)  Height: 6\' 3"  (1.905 m)    Wt Readings from Last 3 Encounters:  05/16/20 94.1 kg (207 lb 6.4 oz)  10/05/19 93.9 kg (207 lb)  09/07/19 93.4 kg (206 lb)   Physical exam: General:  Well appearing. No resp difficulty HEENT: normal Neck: supple. no JVD. Carotids 2+ bilat; no bruits. No lymphadenopathy or thryomegaly appreciated. Cor: PMI nondisplaced. Regular rate & rhythm. No rubs, gallops or murmurs. Lungs: clear Abdomen: soft, nontender, nondistended. No hepatosplenomegaly. No bruits or masses. Good bowel sounds. Extremities: no cyanosis, clubbing, rash, edema Neuro: alert & orientedx3, cranial nerves grossly intact. moves all 4 extremities w/o difficulty. Affect pleasant  Recent Labs: 09/07/2019: Magnesium 2.4 10/05/2019: B Natriuretic Peptide 478.7; BUN 25; Creatinine, Ser 1.44; Potassium 4.8; Sodium 137   Lipid Panel     Component Value  Date/Time   CHOL 108 01/08/2015 0850   CHOL 101 12/04/2014 1030   TRIG 155.0 (H) 01/08/2015 0850   HDL 39.00 (L) 01/08/2015 0850   HDL 43 12/04/2014 1030   CHOLHDL 3 01/08/2015 0850   VLDL 31.0 01/08/2015 0850   LDLCALC 38 01/08/2015 0850   LDLCALC 39 12/04/2014 1030     ASSESSMENT AND PLAN:  1. Chronic systolic due to ischemic CM  - Echo 03/2016 LVEF 20-25%, Grade 2 DD - Echo 3/18 shows with stable LVEF 20-25%. Normal RV.  - Echo 9/18 EF 20-25%, grade 2 DD  - Echo today 05/16/20  EF 25% RV normal. Personally reviewed - Doing well. Stable NYHA II symptoms - Volume status stable on lasix 20 mg daily.   - Continue Entresto 24/26.  Up-titration Limited by hypotension.  - Continue carvedilol 6.25 mg BID.  - Continue sprio 25 mg daily.   - Continue Farxiga 10 - ICD interrogated personally 2.  CAD s/p Anterior MI December 2015:  - No s/s ischemia - Continue ASA, statin and Effient  - Lipids per Dr. Brigitte Pulse.  3. Tobacco abuse:  - Remains quit 4. Emphysema - Sees pulmonary. Spirometry on CPX previously reviewed with only mild disease.     Glori Bickers, MD  3:28 PM

## 2020-05-16 NOTE — Progress Notes (Signed)
Echocardiogram 2D Echocardiogram has been performed.  Warren Lacy Jene Huq 05/16/2020, 3:09 PM

## 2020-05-16 NOTE — Patient Instructions (Signed)
Please call our office in December to schedule your follow up appointment  If you have any questions or concerns before your next appointment please send us a message through mychart or call our office at [phone].    TO LEAVE A MESSAGE FOR THE NURSE SELECT OPTION 2, PLEASE LEAVE A MESSAGE INCLUDING: . YOUR NAME . DATE OF BIRTH . CALL BACK NUMBER . REASON FOR CALL**this is important as we prioritize the call backs  YOU WILL RECEIVE A CALL BACK THE SAME DAY AS LONG AS YOU CALL BEFORE 4:00 PM  At the Advanced Heart Failure Clinic, you and your health needs are our priority. As part of our continuing mission to provide you with exceptional heart care, we have created designated Provider Care Teams. These Care Teams include your primary Cardiologist (physician) and Advanced Practice Providers (APPs- Physician Assistants and Nurse Practitioners) who all work together to provide you with the care you need, when you need it.   You may see any of the following providers on your designated Care Team at your next follow up: . Dr Daniel [name] . Dr Dalton McLean . Amy Clegg, NP . Brittainy Simmons, PA . Lauren Kemp, PharmD   Please be sure to bring in all your medications bottles to every appointment.    

## 2020-05-29 ENCOUNTER — Other Ambulatory Visit (HOSPITAL_COMMUNITY): Payer: Self-pay | Admitting: *Deleted

## 2020-05-29 MED ORDER — ATORVASTATIN CALCIUM 20 MG PO TABS: ORAL_TABLET | ORAL | 0 refills | Status: DC

## 2020-06-01 ENCOUNTER — Emergency Department (HOSPITAL_COMMUNITY)
Admission: EM | Admit: 2020-06-01 | Discharge: 2020-06-01 | Disposition: A | Discharge status: Home / Self Care | Payer: PPO | Attending: Emergency Medicine | Admitting: Emergency Medicine

## 2020-06-01 ENCOUNTER — Other Ambulatory Visit: Payer: Self-pay

## 2020-06-01 ENCOUNTER — Encounter (HOSPITAL_COMMUNITY): Payer: Self-pay | Admitting: Emergency Medicine

## 2020-06-01 ENCOUNTER — Emergency Department (HOSPITAL_COMMUNITY): Payer: PPO

## 2020-06-01 DIAGNOSIS — R0602 Shortness of breath: Secondary | ICD-10-CM | POA: Insufficient documentation

## 2020-06-01 DIAGNOSIS — Z79899 Other long term (current) drug therapy: Secondary | ICD-10-CM | POA: Insufficient documentation

## 2020-06-01 DIAGNOSIS — I110 Hypertensive heart disease with heart failure: Secondary | ICD-10-CM | POA: Diagnosis not present

## 2020-06-01 DIAGNOSIS — Z87891 Personal history of nicotine dependence: Secondary | ICD-10-CM | POA: Diagnosis not present

## 2020-06-01 DIAGNOSIS — Z7982 Long term (current) use of aspirin: Secondary | ICD-10-CM | POA: Insufficient documentation

## 2020-06-01 DIAGNOSIS — I2510 Atherosclerotic heart disease of native coronary artery without angina pectoris: Secondary | ICD-10-CM | POA: Insufficient documentation

## 2020-06-01 DIAGNOSIS — Z955 Presence of coronary angioplasty implant and graft: Secondary | ICD-10-CM | POA: Insufficient documentation

## 2020-06-01 DIAGNOSIS — I5022 Chronic systolic (congestive) heart failure: Secondary | ICD-10-CM | POA: Insufficient documentation

## 2020-06-01 DIAGNOSIS — R05 Cough: Secondary | ICD-10-CM | POA: Insufficient documentation

## 2020-06-01 DIAGNOSIS — I252 Old myocardial infarction: Secondary | ICD-10-CM | POA: Insufficient documentation

## 2020-06-01 DIAGNOSIS — I4892 Unspecified atrial flutter: Secondary | ICD-10-CM | POA: Insufficient documentation

## 2020-06-01 DIAGNOSIS — Z7901 Long term (current) use of anticoagulants: Secondary | ICD-10-CM | POA: Insufficient documentation

## 2020-06-01 DIAGNOSIS — R079 Chest pain, unspecified: Secondary | ICD-10-CM | POA: Diagnosis not present

## 2020-06-01 LAB — BASIC METABOLIC PANEL
Anion gap: 10 (ref 5–15)
BUN: 23 mg/dL (ref 8–23)
CO2: 23 mmol/L (ref 22–32)
Calcium: 8.9 mg/dL (ref 8.9–10.3)
Chloride: 103 mmol/L (ref 98–111)
Creatinine, Ser: 1.44 mg/dL — ABNORMAL HIGH (ref 0.61–1.24)
GFR calc Af Amer: 57 mL/min — ABNORMAL LOW (ref >60)
GFR calc non Af Amer: 49 mL/min — ABNORMAL LOW (ref >60)
Glucose, Bld: 111 mg/dL — ABNORMAL HIGH (ref 70–99)
Potassium: 4.3 mmol/L (ref 3.5–5.1)
Sodium: 136 mmol/L (ref 135–145)

## 2020-06-01 LAB — CBC
HCT: 45.9 % (ref 39.0–52.0)
Hemoglobin: 15.2 g/dL (ref 13.0–17.0)
MCH: 31.1 pg (ref 26.0–34.0)
MCHC: 33.1 g/dL (ref 30.0–36.0)
MCV: 94.1 fL (ref 80.0–100.0)
Platelets: 255 10*3/uL (ref 150–400)
RBC: 4.88 MIL/uL (ref 4.22–5.81)
RDW: 13.3 % (ref 11.5–15.5)
WBC: 6.4 10*3/uL (ref 4.0–10.5)
nRBC: 0 % (ref 0.0–0.2)

## 2020-06-01 LAB — TROPONIN I (HIGH SENSITIVITY): Troponin I (High Sensitivity): 14 ng/L (ref <18)

## 2020-06-01 MED ORDER — CARVEDILOL 6.25 MG PO TABS: 3.1250 mg | ORAL_TABLET | Freq: Two times a day (BID) | ORAL | 0 refills | Status: DC

## 2020-06-01 MED ORDER — APIXABAN 5 MG PO TABS
5.0000 mg | ORAL_TABLET | Freq: Two times a day (BID) | ORAL | 0 refills | Status: DC
Start: 2020-06-01 — End: 2020-06-26

## 2020-06-01 MED ORDER — AMIODARONE HCL 200 MG PO TABS
200.0000 mg | ORAL_TABLET | Freq: Two times a day (BID) | ORAL | 0 refills | Status: DC
Start: 2020-06-01 — End: 2020-06-12

## 2020-06-01 MED ORDER — APIXABAN 5 MG PO TABS
5.0000 mg | ORAL_TABLET | Freq: Two times a day (BID) | ORAL | Status: DC
  Administered 2020-06-01: 5 mg via ORAL
  Filled 2020-06-01: qty 1

## 2020-06-01 MED ORDER — SODIUM CHLORIDE 0.9% FLUSH: 3.0000 mL | Freq: Once | INTRAVENOUS | Status: DC

## 2020-06-01 MED ORDER — METOPROLOL TARTRATE 5 MG/5ML IV SOLN
5.0000 mg | Freq: Once | INTRAVENOUS | Status: AC
  Administered 2020-06-01: 5 mg via INTRAVENOUS
  Filled 2020-06-01: qty 5

## 2020-06-01 MED FILL — AMIODARONE HCL 200 MG TAB: 200 | 30 days supply | Qty: 60 | Fill #0

## 2020-06-01 MED FILL — CARVEDILOL 3.125 MG TABLET: 3.125 | 30 days supply | Qty: 60 | Fill #0

## 2020-06-01 MED FILL — ELIQUIS 5 MG TABLET: 5 | 30 days supply | Qty: 60 | Fill #0

## 2020-06-01 NOTE — ED Notes (Signed)
Patient's HR now in the 60s on cardiac monitor, EKG captured and given to Dr. Adela Lank. Pt reports improvement in chest pain at this time.

## 2020-06-01 NOTE — ED Triage Notes (Addendum)
Pt reports central chest pressure that radiates to his jaw and right arm that started at 0445 this morning. Cardiac hx. Pt has a defibrillator. HR 140-150s in triage. Pt reports taking 3 nitro this morning with temporary relief.

## 2020-06-01 NOTE — ED Notes (Signed)
ED Provider at bedside. 

## 2020-06-01 NOTE — Discharge Instructions (Signed)
Return for recurrent episode.  Follow up with your cardiologist in the office.   Information on my medicine - ELIQUIS (apixaban)  This medication education was reviewed with me or my healthcare representative as part of my discharge preparation.  The pharmacist that spoke with me during my hospital stay was:  Daylene Posey, [facility]  Why was Eliquis prescribed for you? Eliquis was prescribed for you to reduce the risk of forming blood clots that can cause a stroke if you have a medical condition called atrial fibrillation (a type of irregular heartbeat) OR to reduce the risk of a blood clots forming after orthopedic surgery.  What do You need to know about Eliquis ? Take your Eliquis TWICE DAILY - one tablet in the morning and one tablet in the evening with or without food.  It would be best to take the doses about the same time each day.  If you have difficulty swallowing the tablet whole please discuss with your pharmacist how to take the medication safely.  Take Eliquis exactly as prescribed by your doctor and DO NOT stop taking Eliquis without talking to the doctor who prescribed the medication.  Stopping may increase your risk of developing a new clot or stroke.  Refill your prescription before you run out.  After discharge, you should have regular check-up appointments with your healthcare provider that is prescribing your Eliquis.  In the future your dose may need to be changed if your kidney function or weight changes by a significant amount or as you get older.  What do you do if you miss a dose? If you miss a dose, take it as soon as you remember on the same day and resume taking twice daily.  Do not take more than one dose of ELIQUIS at the same time.  Important Safety Information A possible side effect of Eliquis is bleeding. You should call your healthcare provider right away if you experience any of the following: ? Bleeding from an injury or your nose that does not  stop. ? Unusual colored urine (red or dark brown) or unusual colored stools (red or black). ? Unusual bruising for unknown reasons. ? A serious fall or if you hit your head (even if there is no bleeding).  Some medicines may interact with Eliquis and might increase your risk of bleeding or clotting while on Eliquis. To help avoid this, consult your healthcare provider or pharmacist prior to using any new prescription or non-prescription medications, including herbals, vitamins, non-steroidal anti-inflammatory drugs (NSAIDs) and supplements.  This website has more information on Eliquis (apixaban): [email or web address].com.cy.

## 2020-06-01 NOTE — Progress Notes (Signed)
   Discussed with Dr Shirlee Latch.   Add eliquis 5 mg twice a day.   Add amio 200 mg twice a day until seen in the A fib clinic. Start [employer].  Cut back home dose of carvedilol to 3.125 mg twice a day.   He has follow up in the  A fib clinic.Monday  06/04/20 at 11:00   Jameison Haji NP-C  9:38 AM

## 2020-06-01 NOTE — ED Provider Notes (Signed)
[facility] EMERGENCY DEPARTMENT Provider Note   CSN: [id number] Arrival date & time: 06/01/20  0701     History Chief Complaint  Patient presents with  . Chest Pain  . Tachycardia    Kyle Bullock is a 68 y.o. male.  69 yo M with a chief complaints of chest pain shortness of breath.  This started this morning and woke him up from sleep.  Has been persistent.  Noticed that his heart was racing on him as well.  Normally has a heart rate in the 90s.  I just seen his cardiologist in the past month.  An echocardiogram that was consistent with his prior ejection fraction.  Denies any recent medication change.  Denies change in his diet.  Denies lower extremity edema.  Has had a little bit of a cough this morning but otherwise has not been coughing denies nausea vomiting or diarrhea.  Denies fevers.  Denies abdominal pain.  The history is provided by the patient.  Illness Severity:  Moderate Onset quality:  Gradual Duration:  3 hours Timing:  Constant Progression:  Unchanged Chronicity:  New Associated symptoms: chest pain, cough and shortness of breath   Associated symptoms: no abdominal pain, no congestion, no diarrhea, no fever, no headaches, no myalgias, no rash and no vomiting        Past Medical History:  Diagnosis Date  . Abnormal TSH    a. 11/2014: felt d/t sick euthyroid.  Marland Kitchen AICD (automatic cardioverter/defibrillator) present 02/28/2015  . CAD (coronary artery disease)    a. 11/2014: arrest/STEMI s/p Xience DES to the LAD and PTCA to the D1 [place], [place]). b. Relook cath 11/2014: no culprit, patent stent.  . CHF (congestive heart failure) (HCC)   . Dressler's syndrome (HCC)    a. 11/2014: tx with colchicine and prednisone.  . Dyslipidemia   . Dyspnea    a. 11/2014: possibly due to combo of CHF and Brilinta. Brilinta changed to Effient.  . Erectile dysfunction    a. Pt aware not to take ED med within 24 hr of NTG and vice versa.  . Hypertension     . Ischemic cardiomyopathy    a. 11/2014: EF reportedly 20% by cath and echo in [place].  Marland Kitchen NSVT (nonsustained ventricular tachycardia) (HCC) 11/16/14   a. 11/2014: admitted after discharge from STEMI admission, 18 beats 135-140bpm in ED.  Marland Kitchen Pericardial effusion    a. 11/2014.  Marland Kitchen Pericarditis dx'd 11/2014  . Pneumonia 1990's X 2  . RBBB   . Sleep apnea    suspected but not diagnosed (02/28/2015)  . STEMI (ST elevation myocardial infarction) (HCC) 11/10/2014  . Tobacco abuse   . VF (ventricular fibrillation) (HCC) 11/10/14   a. 11/2014: arrest with STEMI.    Patient Active Problem List   Diagnosis Date Noted  . Chronic systolic dysfunction of left ventricle 08/27/2015  . Arrhythmia 07/05/2015  . Multiple pulmonary nodules 06/15/2015  . Centrilobular emphysema (HCC) 06/05/2015  . Abnormal CT scan, chest 06/05/2015  . Pericardial effusion 11/26/2014  . Dressler's syndrome (HCC) 11/25/2014  . Hypotension 11/24/2014  . Precordial pain 11/24/2014  . Chest pain 11/24/2014  . Chronic systolic CHF (congestive heart failure) (HCC) 11/23/2014  . History of tobacco abuse 11/23/2014  . ST elevation myocardial infarction (STEMI) involving left anterior descending (LAD) coronary artery with complication (HCC)   . RBBB 11/16/2014  . HTN (hypertension) 11/16/2014  . Dyslipidemia 11/16/2014  . CAD S/P urgent LAD DES, POBA to Dx1 11/10/14 (  [place]) 11/16/2014  . Ischemic cardiomyopathy- 20%-Life Vest 11/16/2014  . Dyspnea 11/16/2014  . NSVT (nonsustained ventricular tachycardia) ([place]) 11/16/2014  . VF arrest- shocked x 1 11/10/14 11/16/2014    Past Surgical History:  Procedure Laterality Date  . CARDIAC CATHETERIZATION  11/19/2014  . CARDIAC DEFIBRILLATOR PLACEMENT  02/28/2015  . CORONARY ANGIOPLASTY WITH STENT PLACEMENT  11/10/2014   LAD DES  . HAND SURGERY Bilateral    Dupuytren's Contracture  . IMPLANTABLE CARDIOVERTER DEFIBRILLATOR IMPLANT N/A 02/28/2015   SJM Fortify Assura VR  ICD implanted by Dr Rayann Heman.  Revascularized following VF arrest, EF remained depressed and ICD implanted  . KNEE ARTHROSCOPY Right 1982  . LEFT HEART CATH N/A 11/19/2014   Procedure: LEFT HEART CATH;  Surgeon: Lorretta Harp, MD;  Location: [facility] C[facility];  Service: Cardiovascular;  Laterality: N/A;       Family History  Problem Relation Age of Onset  . COPD Mother   . Hypertension Mother   . Hypertension Father   . CAD Neg Hx     Social History   Tobacco Use  . Smoking status: Former Smoker    Packs/day: 2.00    Years: 40.00    Pack years: 80.00    Types: Cigarettes    Quit date: 11/10/2014    Years since quitting: 5.5  . Smokeless tobacco: Never Used  Vaping Use  . Vaping Use: Never used  Substance Use Topics  . Alcohol use: Yes    Alcohol/week: 14.0 standard drinks    Types: 14 Glasses of wine per week    Comment: 02/28/2015 "2, 4oz glasses of wine/night"  . Drug use: No    Home Medications Prior to Admission medications   Medication Sig Start Date End Date Taking? Authorizing Provider  ANORO ELLIPTA 62.5-25 MCG/INH AEPB Inhale 1 puff into the lungs daily. 08/21/18  Yes [provider]  aspirin EC 81 MG tablet Take 81 mg by mouth daily.   Yes [provider]  atorvastatin (LIPITOR) 20 MG tablet TAKE 1 TABLET BY MOUTH DAILY AT 6 PM 05/29/20  Yes [name], Shaune Pascal, MD  clopidogrel (PLAVIX) 75 MG tablet TAKE 1 TABLET(75 MG) BY MOUTH DAILY 02/20/20  Yes Larey Dresser, MD  colchicine 0.6 MG tablet Take 0.6 mg by mouth daily as needed. 12/27/19  Yes [provider]  empagliflozin (JARDIANCE) 10 MG TABS tablet Take 10 mg by mouth daily before breakfast. 10/07/19  Yes [name], Shaune Pascal, MD  furosemide (LASIX) 20 MG tablet Take 1 tablet (20 mg total) by mouth daily. Patient taking differently: Take 20 mg by mouth daily as needed for fluid.  02/24/20  Yes [name], Shaune Pascal, MD  levothyroxine (SYNTHROID) 75 MCG tablet Take 75 mcg by mouth daily.  03/30/20  Yes [provider]  loratadine (CLARITIN) 10 MG tablet Take 10 mg by mouth daily.   Yes [provider]  nitroGLYCERIN (NITROSTAT) 0.4 MG SL tablet Place 1 tablet (0.4 mg total) under the tongue every 5 (five) minutes as needed for chest pain (MAX 3 TABLETS). Reported on 12/13/2015 08/23/18  Yes Allred, Jeneen Rinks, MD  sacubitril-valsartan (ENTRESTO) 24-26 MG Take 1 tablet by mouth 2 (two) times daily. 02/24/20  Yes [name], Shaune Pascal, MD  spironolactone (ALDACTONE) 25 MG tablet Take 1 tablet (25 mg total) by mouth daily. Please schedule an appointment 04/25/20  Yes [name], Shaune Pascal, MD  VASCEPA 1 g capsule Take 2 g by mouth daily.  05/13/20  Yes [provider]  amiodarone (PACERONE) 200 MG tablet Take 1 tablet (200 mg total) by mouth 2 (two) times daily. 06/01/20   Melene PlanFloyd, Dolorez Jeffrey, DO  apixaban (ELIQUIS) 5 MG TABS tablet Take 1 tablet (5 mg total) by mouth 2 (two) times daily. 06/01/20 07/01/20  Melene PlanFloyd, Doc Mandala, DO  carvedilol (COREG) 6.25 MG tablet Take 0.5 tablets (3.125 mg total) by mouth 2 (two) times daily with a meal. TAKE 1 TABLET BY MOUTH TWICE DAILY WITH MEAL 06/01/20   Melene PlanFloyd, Layla Kesling, DO    Allergies    Ambien [zolpidem tartrate], Lisinopril, and Brilinta [ticagrelor]  Review of Systems   Review of Systems  Constitutional: Negative for chills and fever.  HENT: Negative for congestion and facial swelling.   Eyes: Negative for discharge and visual disturbance.  Respiratory: Positive for cough and shortness of breath.   Cardiovascular: Positive for chest pain. Negative for palpitations.  Gastrointestinal: Negative for abdominal pain, diarrhea and vomiting.  Musculoskeletal: Negative for arthralgias and myalgias.  Skin: Negative for color change and rash.  Neurological: Negative for tremors, syncope and headaches.  Psychiatric/Behavioral: Negative for confusion and dysphoric mood.    Physical Exam Updated Vital Signs BP 91/78   Pulse 71   Temp (!) 97.5 F (36.4 C)  (Oral)   Resp 11   Ht 6\' 3"  (1.905 m)   Wt 93 kg   SpO2 94%   BMI 25.62 kg/m   Physical Exam Vitals and nursing note reviewed.  Constitutional:      Appearance: He is well-developed.  HENT:     Head: Normocephalic and atraumatic.  Eyes:     Pupils: Pupils are equal, round, and reactive to light.  Neck:     Vascular: No JVD.  Cardiovascular:     Rate and Rhythm: Regular rhythm. Tachycardia present.     Heart sounds: No murmur heard.  No friction rub. No gallop.   Pulmonary:     Effort: No respiratory distress.     Breath sounds: No wheezing.  Abdominal:     General: There is no distension.     Tenderness: There is no guarding or rebound.  Musculoskeletal:        General: Normal range of motion.     Cervical back: Normal range of motion and neck supple.  Skin:    Coloration: Skin is not pale.     Findings: No rash.  Neurological:     Mental Status: He is alert and oriented to person, place, and time.  Psychiatric:        Behavior: Behavior normal.     ED Results / Procedures / Treatments   Labs (all labs ordered are listed, but only abnormal results are displayed) Labs Reviewed  BASIC METABOLIC PANEL - Abnormal; Notable for the following components:      Result Value   Glucose, Bld 111 (*)    Creatinine, Ser 1.44 (*)    GFR calc non Af Amer 49 (*)    GFR calc Af Amer 57 (*)    All other components within normal limits  CBC  MAGNESIUM  BRAIN NATRIURETIC PEPTIDE  TROPONIN I (HIGH SENSITIVITY)  TROPONIN I (HIGH SENSITIVITY)    EKG EKG Interpretation  Date/Time:  Friday June 01 2020 08:38:24 EDT Ventricular Rate:  63 PR Interval:    QRS Duration: 150 QT Interval:  436 QTC Calculation: 447 R Axis:   -25 Text Interpretation: Sinus rhythm Right bundle branch block Anterolateral infarct, age indeterminate Rate slower Otherwise no significant change Confirmed by Melene PlanFloyd, Malyah Ohlrich (  [id number]) on 06/01/2020 9:12:39 AM   Radiology DG Chest 2 View  Result Date:  06/01/2020 CLINICAL DATA:  Chest pain EXAM: CHEST - 2 VIEW COMPARISON:  03/01/2015 FINDINGS: Left-sided implanted cardiac device. Heart size is upper limits of normal. Perihilar and bibasilar interstitial prominence without focal airspace consolidation. No pleural effusion or pneumothorax. IMPRESSION: Increased perihilar and bibasilar interstitial markings may reflect bronchitic type lung changes, mild edema, or atypical/viral infection. Electronically Signed   By: Duanne Guess D.O.   On: 06/01/2020 08:01    Procedures Procedures (including critical care time)  Medications Ordered in ED Medications  sodium chloride flush (NS) 0.9 % injection 3 mL (3 mLs Intravenous Not Given 06/01/20 0741)  apixaban (ELIQUIS) tablet 5 mg (has no administration in time range)  metoprolol tartrate (LOPRESSOR) injection 5 mg (5 mg Intravenous Given 06/01/20 8341)    ED Course  I have reviewed the triage vital signs and the nursing notes.  Pertinent labs & imaging results that were available during my care of the patient were reviewed by me and considered in my medical decision making (see chart for details).    MDM Rules/Calculators/A&P                          69 yo M with a chief complaints of chest pain or shortness of breath.  Patient's heart rate has been in the 140s since arrival.  This is most consistent with atrial flutter with 2-1 block.  No history of the same.  He does have a history of heart failure with an EF of 25% most recently measured.  Will obtain a laboratory evaluation and assess for electrolyte abnormalities.  Otherwise it sounds like the patient was well leading up to this morning.  Will discuss with cardiology.  Cards to have patient seen in afib clinic, want to start amio, eliquis decrease coreg.    CRITICAL CARE Performed by: Rae Roam   Total critical care time: 35 minutes  Critical care time was exclusive of separately billable procedures and treating other  patients.  Critical care was necessary to treat or prevent imminent or life-threatening deterioration.  Critical care was time spent personally by me on the following activities: development of treatment plan with patient and/or surrogate as well as nursing, discussions with consultants, evaluation of patient's response to treatment, examination of patient, obtaining history from patient or surrogate, ordering and performing treatments and interventions, ordering and review of laboratory studies, ordering and review of radiographic studies, pulse oximetry and re-evaluation of patient's condition.   CHA2DS2/VAS Stroke Risk Points      N/A >= 2 Points: High Risk  1 - 1.99 Points: Medium Risk  0 Points: Low Risk    Last Change: N/A      This score determines the patient's risk of having a stroke if the  patient has atrial fibrillation.      This score is not applicable to this patient. Components are not  calculated.     9:46 AM:  I have discussed the diagnosis/risks/treatment options with the patient and family and believe the pt to be eligible for discharge home to follow-up with Cards. We also discussed returning to the ED immediately if new or worsening sx occur. We discussed the sx which are most concerning (e.g., sudden worsening pain, fever, inability to tolerate by mouth) that necessitate immediate return. Medications administered to the patient during their visit and any new prescriptions provided to the patient  are listed below.  Medications given during this visit Medications  sodium chloride flush (NS) 0.9 % injection 3 mL (3 mLs Intravenous Not Given 06/01/20 0741)  apixaban (ELIQUIS) tablet 5 mg (has no administration in time range)  metoprolol tartrate (LOPRESSOR) injection 5 mg (5 mg Intravenous Given 06/01/20 6962)     The patient appears reasonably screen and/or stabilized for discharge and I doubt any other medical condition or other [facility] requiring further screening,  evaluation, or treatment in the ED at this time prior to discharge.   Final Clinical Impression(s) / ED Diagnoses Final diagnoses:  Atrial flutter with rapid ventricular response (HCC)    Rx / DC Orders ED Discharge Orders         Ordered    apixaban (ELIQUIS) 5 MG TABS tablet  2 times daily     Discontinue  Reprint     06/01/20 0944    amiodarone (PACERONE) 200 MG tablet  2 times daily     Discontinue  Reprint     06/01/20 0944    carvedilol (COREG) 6.25 MG tablet  2 times daily with meals     Discontinue  Reprint     06/01/20 0944           Melene Plan, DO 06/01/20 [phone]

## 2020-06-01 NOTE — ED Notes (Signed)
Pt back from x-ray.

## 2020-06-01 NOTE — ED Notes (Signed)
Internal defibrillator interrogated at this time.

## 2020-06-01 NOTE — ED Notes (Signed)
Pharmacy provided d/c meds to patient per Dr. Lanetta Inch orders. Pt educated regarding blood thinners and s/s that would require returning to ED. Patient verbalized understanding of dc instructions, vss, ambulatory with nad.

## 2020-06-04 ENCOUNTER — Encounter (HOSPITAL_COMMUNITY): Payer: Self-pay | Admitting: Nurse Practitioner

## 2020-06-04 ENCOUNTER — Ambulatory Visit (HOSPITAL_COMMUNITY)
Admit: 2020-06-04 | Discharge: 2020-06-04 | Disposition: A | Discharge status: Home / Self Care | Payer: PPO | Source: Ambulatory Visit | Attending: Nurse Practitioner | Admitting: Nurse Practitioner

## 2020-06-04 ENCOUNTER — Other Ambulatory Visit: Payer: Self-pay

## 2020-06-04 VITALS — BP 86/54 | HR 54 | Ht 75.0 in | Wt 209.0 lb

## 2020-06-04 DIAGNOSIS — I2510 Atherosclerotic heart disease of native coronary artery without angina pectoris: Secondary | ICD-10-CM | POA: Diagnosis not present

## 2020-06-04 DIAGNOSIS — I483 Typical atrial flutter: Secondary | ICD-10-CM | POA: Insufficient documentation

## 2020-06-04 DIAGNOSIS — Z7902 Long term (current) use of antithrombotics/antiplatelets: Secondary | ICD-10-CM | POA: Diagnosis not present

## 2020-06-04 DIAGNOSIS — N529 Male erectile dysfunction, unspecified: Secondary | ICD-10-CM | POA: Diagnosis not present

## 2020-06-04 DIAGNOSIS — Z8249 Family history of ischemic heart disease and other diseases of the circulatory system: Secondary | ICD-10-CM | POA: Diagnosis not present

## 2020-06-04 DIAGNOSIS — I959 Hypotension, unspecified: Secondary | ICD-10-CM | POA: Diagnosis not present

## 2020-06-04 DIAGNOSIS — I509 Heart failure, unspecified: Secondary | ICD-10-CM | POA: Insufficient documentation

## 2020-06-04 DIAGNOSIS — I110 Hypertensive heart disease with heart failure: Secondary | ICD-10-CM | POA: Insufficient documentation

## 2020-06-04 DIAGNOSIS — Z79890 Hormone replacement therapy (postmenopausal): Secondary | ICD-10-CM | POA: Diagnosis not present

## 2020-06-04 DIAGNOSIS — Z87891 Personal history of nicotine dependence: Secondary | ICD-10-CM | POA: Diagnosis not present

## 2020-06-04 DIAGNOSIS — Z7982 Long term (current) use of aspirin: Secondary | ICD-10-CM | POA: Insufficient documentation

## 2020-06-04 DIAGNOSIS — Z95810 Presence of automatic (implantable) cardiac defibrillator: Secondary | ICD-10-CM | POA: Diagnosis not present

## 2020-06-04 DIAGNOSIS — Z888 Allergy status to other drugs, medicaments and biological substances status: Secondary | ICD-10-CM | POA: Diagnosis not present

## 2020-06-04 DIAGNOSIS — I4891 Unspecified atrial fibrillation: Secondary | ICD-10-CM | POA: Diagnosis present

## 2020-06-04 DIAGNOSIS — E785 Hyperlipidemia, unspecified: Secondary | ICD-10-CM | POA: Insufficient documentation

## 2020-06-04 DIAGNOSIS — Z79899 Other long term (current) drug therapy: Secondary | ICD-10-CM | POA: Diagnosis not present

## 2020-06-04 DIAGNOSIS — I4510 Unspecified right bundle-branch block: Secondary | ICD-10-CM | POA: Insufficient documentation

## 2020-06-04 DIAGNOSIS — I252 Old myocardial infarction: Secondary | ICD-10-CM | POA: Diagnosis not present

## 2020-06-04 DIAGNOSIS — Z7984 Long term (current) use of oral hypoglycemic drugs: Secondary | ICD-10-CM | POA: Diagnosis not present

## 2020-06-04 DIAGNOSIS — Z825 Family history of asthma and other chronic lower respiratory diseases: Secondary | ICD-10-CM | POA: Insufficient documentation

## 2020-06-04 DIAGNOSIS — Z7901 Long term (current) use of anticoagulants: Secondary | ICD-10-CM | POA: Insufficient documentation

## 2020-06-04 DIAGNOSIS — Z955 Presence of coronary angioplasty implant and graft: Secondary | ICD-10-CM | POA: Insufficient documentation

## 2020-06-04 NOTE — Progress Notes (Signed)
Primary Care Physician: Marton Redwood, MD Referring Physician: [facility] ED  Cardiologist: Dr. 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 male with a h/o CAD, acute MI in 2015,coplicated by v fib arrest, ischemic cardiomyopathy 20-25% with St Jude single chamber ICD implanted 02/28/2015, followed by Dr. Haroldine Laws and Dr. Rayann Heman.    Was seen in the  ED on Friday  for sudden onset of chest pressure and heart racing.EKG showed  atrial flutter around  150 bpm. Pt did return to SR with metoprolol tartrated 5 mg IV. Case was discussed with Dr. Aundra Dubin and patient  was started on amiodarone 200 mg bid and eliquis 5 mg for a CHA2DS2VASc score of 3. He has hypotension chronically and has felt a little lightheaded since starting amiodarone. BP usually runs around 90 systolic and today is 86 systolic. He is now on triple antiplatelet  therapy with asa/plavix and eliquis  on board. Otherwise, he feels improved and no further arrhythmia.   Today, he denies symptoms of palpitations, chest pain, shortness of breath, orthopnea, PND, lower extremity edema, dizziness, presyncope, syncope, or neurologic sequela. The patient is tolerating medications without difficulties and is otherwise without complaint today.   Past Medical History:  Diagnosis Date  . Abnormal TSH    a. 11/2014: felt d/t sick euthyroid.  Marland Kitchen AICD (automatic cardioverter/defibrillator) present 02/28/2015  . CAD (coronary artery disease)    a. 11/2014: arrest/STEMI s/p Xience DES to the LAD and PTCA to the D1 ([place]). b. Relook cath 11/2014: no culprit, patent stent.  . CHF (congestive heart failure) ([place])   . 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 11/2014: tx with colchicine and prednisone.  . Dyslipidemia   . Dyspnea    a. 11/2014: possibly due to combo of CHF and Brilinta. Brilinta changed to Effient.  . Erectile dysfunction    a. Pt aware not to take ED med within 24 hr of NTG and vice versa.  . Hypertension   . Ischemic  cardiomyopathy    a. 11/2014: EF reportedly 20% by cath and echo in [place].  Marland Kitchen NSVT (nonsustained ventricular tachycardia) ([place]) 11/16/14   a. 11/2014: admitted after discharge from STEMI admission, 18 beats 135-140bpm in ED.  Marland Kitchen Pericardial effusion    a. 11/2014.  Marland Kitchen Pericarditis dx'd 11/2014  . Pneumonia 1990's X 2  . RBBB   . Sleep apnea    suspected but not diagnosed (02/28/2015)  . STEMI (ST elevation myocardial infarction) ([place]) 11/10/2014  . Tobacco abuse   . VF (ventricular fibrillation) ([place]) 11/10/14   a. 11/2014: arrest with STEMI.   Past Surgical History:  Procedure Laterality Date  . CARDIAC CATHETERIZATION  11/19/2014  . CARDIAC DEFIBRILLATOR PLACEMENT  02/28/2015  . CORONARY ANGIOPLASTY WITH STENT PLACEMENT  11/10/2014   LAD DES  . HAND SURGERY Bilateral    Dupuytren's Contracture  . IMPLANTABLE CARDIOVERTER DEFIBRILLATOR IMPLANT N/A 02/28/2015   SJM Fortify Assura VR ICD implanted by Dr Rayann Heman.  Revascularized following VF arrest, EF remained depressed and ICD implanted  . KNEE ARTHROSCOPY Right 1982  . LEFT HEART CATH N/A 11/19/2014   Procedure: LEFT HEART CATH;  Surgeon: Lorretta Harp, MD;  Location: [facility] CATH LAB;  Service: Cardiovascular;  Laterality: N/A;    Current Outpatient Medications  Medication Sig Dispense Refill  . amiodarone (PACERONE) 200 MG tablet Take 1 tablet (200 mg total) by mouth 2 (two) times daily. 60 tablet 0  . ANORO ELLIPTA 62.5-25 MCG/INH AEPB  Inhale 1 puff into the lungs daily.  11  . apixaban (ELIQUIS) 5 MG TABS tablet Take 1 tablet (5 mg total) by mouth 2 (two) times daily. 60 tablet 0  . aspirin EC 81 MG tablet Take 81 mg by mouth daily.    Marland Kitchen atorvastatin (LIPITOR) 20 MG tablet TAKE 1 TABLET BY MOUTH DAILY AT 6 PM 90 tablet 0  . carvedilol (COREG) 6.25 MG tablet Take 0.5 tablets (3.125 mg total) by mouth 2 (two) times daily with a meal. 180 tablet 0  . clopidogrel (PLAVIX) 75 MG tablet TAKE 1 TABLET(75 MG) BY MOUTH DAILY 90 tablet 3    . colchicine 0.6 MG tablet Take 0.6 mg by mouth daily as needed.    . empagliflozin (JARDIANCE) 10 MG TABS tablet Take 10 mg by mouth daily before breakfast. 30 tablet 11  . furosemide (LASIX) 20 MG tablet Take 1 tablet (20 mg total) by mouth daily. (Patient taking differently: Take 20 mg by mouth daily as needed for fluid. ) 90 tablet 3  . levothyroxine (SYNTHROID) 75 MCG tablet Take 75 mcg by mouth daily.    Marland Kitchen loratadine (CLARITIN) 10 MG tablet Take 10 mg by mouth daily.    . nitroGLYCERIN (NITROSTAT) 0.4 MG SL tablet Place 1 tablet (0.4 mg total) under the tongue every 5 (five) minutes as needed for chest pain (MAX 3 TABLETS). Reported on 12/13/2015 10 tablet 3  . sacubitril-valsartan (ENTRESTO) 24-26 MG Take 1 tablet by mouth 2 (two) times daily. 180 tablet 1  . spironolactone (ALDACTONE) 25 MG tablet Take 1 tablet (25 mg total) by mouth daily. Please schedule an appointment 90 tablet 1  . VASCEPA 1 g capsule Take 2 g by mouth daily.      No current facility-administered medications for this encounter.    Allergies  Allergen Reactions  . Ambien [Zolpidem Tartrate] Anxiety and Other (See Comments)    Causes nightmares  . Lisinopril Cough  . Brilinta [Ticagrelor] Cough    ? Dyspnea. Changed to Effient 11/2014.    Social History   Socioeconomic History  . Marital status: Married    Spouse name: Not on file  . Number of children: 2  . Years of education: Not on file  . Highest education level: Not on file  Occupational History  . Not on file  Tobacco Use  . Smoking status: Former Smoker    Packs/day: 2.00    Years: 40.00    Pack years: 80.00    Types: Cigarettes    Quit date: 11/10/2014    Years since quitting: 5.5  . Smokeless tobacco: Never Used  Vaping Use  . Vaping Use: Never used  Substance and Sexual Activity  . Alcohol use: Yes    Alcohol/week: 14.0 standard drinks    Types: 14 Glasses of wine per week    Comment: 02/28/2015 "2, 4oz glasses of wine/night"  . Drug  use: No  . Sexual activity: Not Currently  Other Topics Concern  . 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  Attended [employer].  Now lives in [place].   Social Determinants of Health   Financial Resource Strain:   . Difficulty of Paying Living Expenses:   Food Insecurity:   . Worried About Programme researcher, broadcasting/film/video in the Last Year:   . Barista in the Last Year:   Transportation Needs:   . Freight forwarder (Medical):   Marland Kitchen Lack of  Transportation (Non-Medical):   Physical Activity:   . Days of Exercise per Week:   . Minutes of Exercise per Session:   Stress:   . Feeling of Stress :   Social Connections:   . Frequency of Communication with Friends and Family:   . Frequency of Social Gatherings with Friends and Family:   . Attends Religious Services:   . Active Member of Clubs or Organizations:   . Attends Banker Meetings:   Marland Kitchen Marital Status:   Intimate Partner Violence:   . Fear of Current or Ex-Partner:   . Emotionally Abused:   Marland Kitchen Physically Abused:   . Sexually Abused:     Family History  Problem Relation Age of Onset  . COPD Mother   . Hypertension Mother   . Hypertension Father   . CAD Neg Hx     ROS- All systems are reviewed and negative except as per the HPI above  Physical Exam: Vitals:   06/04/20 1110  BP: (!) 86/54  Pulse: (!) 54  Weight: 94.8 kg  Height: 6\' 3"  (1.905 m)   Wt Readings from Last 3 Encounters:  06/04/20 94.8 kg  06/01/20 93 kg  05/16/20 94.1 kg    Labs: Lab Results  Component Value Date   NA 136 06/01/2020   K 4.3 06/01/2020   CL 103 06/01/2020   CO2 23 06/01/2020   GLUCOSE 111 (H) 06/01/2020   BUN 23 06/01/2020   CREATININE 1.44 (H) 06/01/2020   CALCIUM 8.9 06/01/2020   MG 2.4 (H) 09/07/2019   Lab Results  Component Value Date   INR 1.17 11/24/2014   Lab Results  Component Value Date   CHOL 108 01/08/2015   HDL 39.00 (L) 01/08/2015   LDLCALC 38  01/08/2015   TRIG 155.0 (H) 01/08/2015     GEN- The patient is well appearing, alert and oriented x 3 today.   Head- normocephalic, atraumatic Eyes-  Sclera clear, conjunctiva pink Ears- hearing intact Oropharynx- clear Neck- supple, no JVP Lymph- no cervical lymphadenopathy Lungs- Clear to ausculation bilaterally, normal work of breathing Heart- Regular rate and rhythm, no murmurs, rubs or gallops, PMI not laterally displaced GI- soft, NT, ND, + BS Extremities- no clubbing, cyanosis, or edema MS- no significant deformity or atrophy Skin- no rash or lesion Psych- euthymic mood, full affect Neuro- strength and sensation are intact  EKG-sinus brady  at 54 bpm, pr int 192 ms, qrs int 138 ms, qtc 434 ms  Ekg's were reviewed from  ER  with Dr. 03/09/2015 and although difficult to tell, he feels this may be a  typical atrial  flutter.     Assessment and Plan: 1. New onset typical atrial flutter  Continue amiodarone 200 mg bid Pt may have had  typical atrial flutter so I will request  an appointment with Dr. Johney Frame - 4-5 weeks to see if candidate for ablation  Decrease wine from  2 glasses nightly to 2 drinks a week   2. CHA2DS2VASc score of 3 Continue eliquis 5 mg bid Discussed bleeding precautions   I discussed with Dr. Johney Frame now that pt is on triple platelet therapy if he can stop asa or plavix with the addition of eliquis He said he can actually stop both since his MI was in 2015 and continue with eliquis  3. CAD No current anginal symptoms   4. LV dysfunction  ICD in place followed by Dr. 2016  Continue with entresol, carvedilol that was decreased to 3.125  mg bid with addition of amiodarone, spironolactone, coreg, lasix as ordered   5. Hypotension  This is chronic, but slightly lower this am Encouraged to drink 1-2 glasses of extra water today   I will see back next week and may decrease amiodarone dose to  200 mg daily at that point   [place] [facility] Matthew Folks [facility]  Clinic [facility] [address] [place], [place] [address] [phone]

## 2020-06-05 ENCOUNTER — Ambulatory Visit (INDEPENDENT_AMBULATORY_CARE_PROVIDER_SITE_OTHER): Payer: PPO | Admitting: *Deleted

## 2020-06-05 DIAGNOSIS — I255 Ischemic cardiomyopathy: Secondary | ICD-10-CM

## 2020-06-05 LAB — CUP PACEART REMOTE DEVICE CHECK
Battery Remaining Longevity: 61 mo
Battery Remaining Percentage: 57 %
Battery Voltage: 3.01 V
Brady Statistic RV Percent Paced: 1 %
Date Time Interrogation Session: 20210629020021
HighPow Impedance: 68 Ohm
HighPow Impedance: 68 Ohm
Implantable Lead Implant Date: 20160323
Implantable Lead Location: 753860
Implantable Pulse Generator Implant Date: 20160323
Lead Channel Impedance Value: 380 Ohm
Lead Channel Pacing Threshold Amplitude: 0.5 V
Lead Channel Pacing Threshold Pulse Width: 0.5 ms
Lead Channel Sensing Intrinsic Amplitude: 11.9 mV
Lead Channel Setting Pacing Amplitude: 2.5 V
Lead Channel Setting Pacing Pulse Width: 0.5 ms
Lead Channel Setting Sensing Sensitivity: 0.5 mV
Pulse Gen Serial Number: 7255600

## 2020-06-07 NOTE — Progress Notes (Signed)
Remote ICD transmission.   

## 2020-06-12 ENCOUNTER — Other Ambulatory Visit: Payer: Self-pay

## 2020-06-12 ENCOUNTER — Encounter (HOSPITAL_COMMUNITY): Payer: Self-pay | Admitting: Nurse Practitioner

## 2020-06-12 ENCOUNTER — Ambulatory Visit (HOSPITAL_COMMUNITY)
Admission: RE | Admit: 2020-06-12 | Discharge: 2020-06-12 | Disposition: A | Discharge status: Home / Self Care | Payer: PPO | Source: Ambulatory Visit | Attending: Nurse Practitioner | Admitting: Nurse Practitioner

## 2020-06-12 VITALS — BP 90/60 | HR 61 | Ht 75.0 in | Wt 207.8 lb

## 2020-06-12 DIAGNOSIS — I255 Ischemic cardiomyopathy: Secondary | ICD-10-CM | POA: Insufficient documentation

## 2020-06-12 DIAGNOSIS — Z7901 Long term (current) use of anticoagulants: Secondary | ICD-10-CM | POA: Diagnosis not present

## 2020-06-12 DIAGNOSIS — E785 Hyperlipidemia, unspecified: Secondary | ICD-10-CM | POA: Diagnosis not present

## 2020-06-12 DIAGNOSIS — I4892 Unspecified atrial flutter: Secondary | ICD-10-CM | POA: Insufficient documentation

## 2020-06-12 DIAGNOSIS — Z8249 Family history of ischemic heart disease and other diseases of the circulatory system: Secondary | ICD-10-CM | POA: Diagnosis not present

## 2020-06-12 DIAGNOSIS — I4510 Unspecified right bundle-branch block: Secondary | ICD-10-CM | POA: Insufficient documentation

## 2020-06-12 DIAGNOSIS — I2510 Atherosclerotic heart disease of native coronary artery without angina pectoris: Secondary | ICD-10-CM | POA: Diagnosis not present

## 2020-06-12 DIAGNOSIS — I110 Hypertensive heart disease with heart failure: Secondary | ICD-10-CM | POA: Diagnosis not present

## 2020-06-12 DIAGNOSIS — Z79899 Other long term (current) drug therapy: Secondary | ICD-10-CM | POA: Diagnosis not present

## 2020-06-12 DIAGNOSIS — Z79890 Hormone replacement therapy (postmenopausal): Secondary | ICD-10-CM | POA: Diagnosis not present

## 2020-06-12 DIAGNOSIS — N529 Male erectile dysfunction, unspecified: Secondary | ICD-10-CM | POA: Insufficient documentation

## 2020-06-12 DIAGNOSIS — I9589 Other hypotension: Secondary | ICD-10-CM | POA: Insufficient documentation

## 2020-06-12 DIAGNOSIS — I509 Heart failure, unspecified: Secondary | ICD-10-CM | POA: Diagnosis not present

## 2020-06-12 DIAGNOSIS — Z95810 Presence of automatic (implantable) cardiac defibrillator: Secondary | ICD-10-CM | POA: Insufficient documentation

## 2020-06-12 DIAGNOSIS — I252 Old myocardial infarction: Secondary | ICD-10-CM | POA: Diagnosis not present

## 2020-06-12 DIAGNOSIS — I483 Typical atrial flutter: Secondary | ICD-10-CM

## 2020-06-12 DIAGNOSIS — D6869 Other thrombophilia: Secondary | ICD-10-CM

## 2020-06-12 DIAGNOSIS — Z7984 Long term (current) use of oral hypoglycemic drugs: Secondary | ICD-10-CM | POA: Diagnosis not present

## 2020-06-12 DIAGNOSIS — Z888 Allergy status to other drugs, medicaments and biological substances status: Secondary | ICD-10-CM | POA: Insufficient documentation

## 2020-06-12 DIAGNOSIS — Z955 Presence of coronary angioplasty implant and graft: Secondary | ICD-10-CM | POA: Diagnosis not present

## 2020-06-12 DIAGNOSIS — Z87891 Personal history of nicotine dependence: Secondary | ICD-10-CM | POA: Insufficient documentation

## 2020-06-12 DIAGNOSIS — I472 Ventricular tachycardia: Secondary | ICD-10-CM | POA: Insufficient documentation

## 2020-06-12 MED ORDER — AMIODARONE HCL 200 MG PO TABS
200.0000 mg | ORAL_TABLET | Freq: Every day | ORAL | 3 refills | Status: DC
Start: 2020-06-12 — End: 2020-07-17

## 2020-06-12 NOTE — Patient Instructions (Signed)
On Saturday reduce Amiodarone to 200mg  once a day

## 2020-06-12 NOTE — Progress Notes (Signed)
Primary Care Physician: Martha Clan, MD Referring Physician: [facility] ED  Cardiologist: Dr. Gala Romney EP: Dr. Ronald Lobo is a 69 y.o. male with a h/o CAD, acute MI in 2015,complicated by v fib arrest, ischemic cardiomyopathy 20-25% with St Jude single chamber ICD implanted 02/28/2015, followed by Dr. Gala Romney and Dr. Johney Frame.    Was seen in the  ED on Friday  for sudden onset of chest pressure and heart racing.EKG showed  atrial flutter around  150 bpm. Pt did return to SR with metoprolol tartrated 5 mg IV. Case was discussed with Dr. Shirlee Latch and patient  was started on amiodarone 200 mg bid and eliquis 5 mg for a CHA2DS2VASc score of 3. He has hypotension chronically and has felt a little lightheaded since starting amiodarone. BP usually runs around 90 systolic and today is 86 systolic. He is now on triple antiplatelet  therapy with asa/plavix and eliquis  on board. Otherwise, he feels improved and no further arrhythmia.   F/u in afib clinic, 7/6. No further arrhythmia. Has stopped asa/plavix and is on eliquis 5 mg bid only now. . Feels better coming off asa/plavix.   Today, he denies symptoms of palpitations, chest pain, shortness of breath, orthopnea, PND, lower extremity edema, dizziness, presyncope, syncope, or neurologic sequela. The patient is tolerating medications without difficulties and is otherwise without complaint today.   Past Medical History:  Diagnosis Date  . Abnormal TSH    a. 11/2014: felt d/t sick euthyroid.  Marland Kitchen AICD (automatic cardioverter/defibrillator) present 02/28/2015  . CAD (coronary artery disease)    a. 11/2014: arrest/STEMI s/p Xience DES to the LAD and PTCA to the D1 [place], [place]). b. Relook cath 11/2014: no culprit, patent stent.  . CHF (congestive heart failure) ([facility])   . Dressler's syndrome ([facility])    a. 11/2014: tx with colchicine and prednisone.  . Dyslipidemia   . Dyspnea    a. 11/2014: possibly due to combo of CHF and Brilinta. Brilinta  changed to Effient.  . Erectile dysfunction    a. Pt aware not to take ED med within 24 hr of NTG and vice versa.  . Hypertension   . Ischemic cardiomyopathy    a. 11/2014: EF reportedly 20% by cath and echo in [place].  Marland Kitchen NSVT (nonsustained ventricular tachycardia) ([facility]) 11/16/14   a. 11/2014: admitted after discharge from STEMI admission, 18 beats 135-140bpm in ED.  Marland Kitchen Pericardial effusion    a. 11/2014.  Marland Kitchen Pericarditis dx'd 11/2014  . Pneumonia 1990's X 2  . RBBB   . Sleep apnea    suspected but not diagnosed (02/28/2015)  . STEMI (ST elevation myocardial infarction) ([facility]) 11/10/2014  . Tobacco abuse   . VF (ventricular fibrillation) ([facility]) 11/10/14   a. 11/2014: arrest with STEMI.   Past Surgical History:  Procedure Laterality Date  . CARDIAC CATHETERIZATION  11/19/2014  . CARDIAC DEFIBRILLATOR PLACEMENT  02/28/2015  . CORONARY ANGIOPLASTY WITH STENT PLACEMENT  11/10/2014   LAD DES  . HAND SURGERY Bilateral    Dupuytren's Contracture  . IMPLANTABLE CARDIOVERTER DEFIBRILLATOR IMPLANT N/A 02/28/2015   SJM Fortify Assura VR ICD implanted by Dr Johney Frame.  Revascularized following VF arrest, EF remained depressed and ICD implanted  . KNEE ARTHROSCOPY Right 1982  . LEFT HEART CATH N/A 11/19/2014   Procedure: LEFT HEART CATH;  Surgeon: Runell Gess, MD;  Location: [facility] CATH LAB;  Service: Cardiovascular;  Laterality: N/A;    Current Outpatient Medications  Medication Sig Dispense Refill  .  amiodarone (PACERONE) 200 MG tablet Take 1 tablet (200 mg total) by mouth 2 (two) times daily. 60 tablet 0  . ANORO ELLIPTA 62.5-25 MCG/INH AEPB Inhale 1 puff into the lungs daily.  11  . apixaban (ELIQUIS) 5 MG TABS tablet Take 1 tablet (5 mg total) by mouth 2 (two) times daily. 60 tablet 0  . aspirin EC 81 MG tablet Take 81 mg by mouth daily.    Marland Kitchen. atorvastatin (LIPITOR) 20 MG tablet TAKE 1 TABLET BY MOUTH DAILY AT 6 PM 90 tablet 0  . carvedilol (COREG) 6.25 MG tablet Take 0.5 tablets (3.125 mg  total) by mouth 2 (two) times daily with a meal. 180 tablet 0  . clopidogrel (PLAVIX) 75 MG tablet TAKE 1 TABLET(75 MG) BY MOUTH DAILY 90 tablet 3  . colchicine 0.6 MG tablet Take 0.6 mg by mouth daily as needed.    . empagliflozin (JARDIANCE) 10 MG TABS tablet Take 10 mg by mouth daily before breakfast. 30 tablet 11  . furosemide (LASIX) 20 MG tablet Take 1 tablet (20 mg total) by mouth daily. (Patient taking differently: Take 20 mg by mouth daily as needed for fluid. ) 90 tablet 3  . levothyroxine (SYNTHROID) 75 MCG tablet Take 75 mcg by mouth daily.    Marland Kitchen. loratadine (CLARITIN) 10 MG tablet Take 10 mg by mouth daily.    . nitroGLYCERIN (NITROSTAT) 0.4 MG SL tablet Place 1 tablet (0.4 mg total) under the tongue every 5 (five) minutes as needed for chest pain (MAX 3 TABLETS). Reported on 12/13/2015 10 tablet 3  . sacubitril-valsartan (ENTRESTO) 24-26 MG Take 1 tablet by mouth 2 (two) times daily. 180 tablet 1  . spironolactone (ALDACTONE) 25 MG tablet Take 1 tablet (25 mg total) by mouth daily. Please schedule an appointment 90 tablet 1  . VASCEPA 1 g capsule Take 2 g by mouth daily.      No current facility-administered medications for this encounter.    Allergies  Allergen Reactions  . Ambien [Zolpidem Tartrate] Anxiety and Other (See Comments)    Causes nightmares  . Lisinopril Cough  . Brilinta [Ticagrelor] Cough    ? Dyspnea. Changed to Effient 11/2014.    Social History   Socioeconomic History  . Marital status: Married    Spouse name: Not on file  . Number of children: 2  . Years of education: Not on file  . Highest education level: Not on file  Occupational History  . Not on file  Tobacco Use  . Smoking status: Former Smoker    Packs/day: 2.00    Years: 40.00    Pack years: 80.00    Types: Cigarettes    Quit date: 11/10/2014    Years since quitting: 5.5  . Smokeless tobacco: Never Used  Vaping Use  . Vaping Use: Never used  Substance and Sexual Activity  . Alcohol  use: Yes    Alcohol/week: 14.0 standard drinks    Types: 14 Glasses of wine per week    Comment: 02/28/2015 "2, 4oz glasses of wine/night"  . Drug use: No  . Sexual activity: Not Currently  Other Topics Concern  . 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  Now lives in [place].   Social Determinants of Health   Financial Resource Strain:   . Difficulty of Paying Living Expenses:   Food Insecurity:   . Worried About Programme researcher, broadcasting/film/videounning Out of Food in the Last Year:   .  Ran Out of Food in the Last Year:   Transportation Needs:   . Freight forwarder (Medical):   Marland Kitchen Lack of Transportation (Non-Medical):   Physical Activity:   . Days of Exercise per Week:   . Minutes of Exercise per Session:   Stress:   . Feeling of Stress :   Social Connections:   . Frequency of Communication with Friends and Family:   . Frequency of Social Gatherings with Friends and Family:   . Attends Religious Services:   . Active Member of Clubs or Organizations:   . Attends Banker Meetings:   Marland Kitchen Marital Status:   Intimate Partner Violence:   . Fear of Current or Ex-Partner:   . Emotionally Abused:   Marland Kitchen Physically Abused:   . Sexually Abused:     Family History  Problem Relation Age of Onset  . COPD Mother   . Hypertension Mother   . Hypertension Father   . CAD Neg Hx     ROS- All systems are reviewed and negative except as per the HPI above  Physical Exam: There were no vitals filed for this visit. Wt Readings from Last 3 Encounters:  06/04/20 94.8 kg  06/01/20 93 kg  05/16/20 94.1 kg    Labs: Lab Results  Component Value Date   NA 136 06/01/2020   K 4.3 06/01/2020   CL 103 06/01/2020   CO2 23 06/01/2020   GLUCOSE 111 (H) 06/01/2020   BUN 23 06/01/2020   CREATININE 1.44 (H) 06/01/2020   CALCIUM 8.9 06/01/2020   MG 2.4 (H) 09/07/2019   Lab Results  Component Value Date   INR 1.17 11/24/2014   Lab Results    Component Value Date   CHOL 108 01/08/2015   HDL 39.00 (L) 01/08/2015   LDLCALC 38 01/08/2015   TRIG 155.0 (H) 01/08/2015     GEN- The patient is well appearing, alert and oriented x 3 today.   Head- normocephalic, atraumatic Eyes-  Sclera clear, conjunctiva pink Ears- hearing intact Oropharynx- clear Neck- supple, no JVP Lymph- no cervical lymphadenopathy Lungs- Clear to ausculation bilaterally, normal work of breathing Heart- Regular rate and rhythm, no murmurs, rubs or gallops, PMI not laterally displaced GI- soft, NT, ND, + BS Extremities- no clubbing, cyanosis, or edema MS- no significant deformity or atrophy Skin- no rash or lesion Psych- euthymic mood, full affect Neuro- strength and sensation are intact  EKG-sinus rhythm  at 61 bpm, pr int 194 ms, qrs int 140  ms, qtc 467  ms  Ekg's were reviewed from  ER  with Dr. Johney Frame and although difficult to tell, he feels this may be a  typical atrial  flutter.     Assessment and Plan: 1. New onset  atrial flutter  Continue amiodarone 200 mg bid but decrease to one tablet a day starting Saturday am Pt may have had  typical atrial flutter so I will request  an appointment with Dr. Johney Frame - 4-5 weeks to see if candidate for ablation  Decrease wine from  2 glasses nightly to 2 drinks a week   2. CHA2DS2VASc score of 3 Continue eliquis 5 mg bid Discussed bleeding precautions   I discussed with Dr. Gala Romney now that pt is on triple platelet therapy if he can stop asa or plavix with the addition of eliquis He said he can actually stop both since his MI was in 2015 and continue with eliquis, which pt has done   3. CAD No current  anginal symptoms   4. LV dysfunction  ICD in place followed by Dr. Johney Frame  Continue with entresol, carvedilol that was decreased to 3.125 mg bid with addition of amiodarone, spironolactone, coreg, lasix as ordered   5. Hypotension  Chronic   F/u with Dr. Johney Frame 8/16  [place] [facility]. Matthew Folks [facility]  Clinic [facility] [address] [place], [place] [address] [phone]

## 2020-06-26 ENCOUNTER — Other Ambulatory Visit (HOSPITAL_COMMUNITY): Payer: Self-pay

## 2020-06-26 MED ORDER — APIXABAN 5 MG PO TABS
5.0000 mg | ORAL_TABLET | Freq: Two times a day (BID) | ORAL | 6 refills | Status: DC
Start: 2020-06-26 — End: 2020-09-02

## 2020-07-17 ENCOUNTER — Telehealth (HOSPITAL_COMMUNITY): Payer: Self-pay

## 2020-07-17 ENCOUNTER — Other Ambulatory Visit (HOSPITAL_COMMUNITY): Payer: Self-pay | Admitting: Internal Medicine

## 2020-07-17 MED ORDER — AMIODARONE HCL 200 MG PO TABS: 200.0000 mg | ORAL_TABLET | Freq: Every day | ORAL | 0 refills | Status: DC

## 2020-07-17 NOTE — Telephone Encounter (Signed)
Pt called afib clinic for refills on amio. Pt not scheduled to follow up with afib clinic but with Dr. Johney Frame on 8/16. Will send 30 day supply. Pt can receive additional refills upon completion of appt with Dr. Johney Frame

## 2020-07-23 ENCOUNTER — Other Ambulatory Visit: Payer: Self-pay

## 2020-07-23 ENCOUNTER — Ambulatory Visit: Payer: PPO | Admitting: Internal Medicine

## 2020-07-23 ENCOUNTER — Encounter: Payer: Self-pay | Admitting: *Deleted

## 2020-07-23 ENCOUNTER — Encounter: Payer: Self-pay | Admitting: Internal Medicine

## 2020-07-23 VITALS — BP 102/60 | HR 50 | Ht 75.0 in | Wt 207.0 lb

## 2020-07-23 DIAGNOSIS — I483 Typical atrial flutter: Secondary | ICD-10-CM

## 2020-07-23 DIAGNOSIS — I5022 Chronic systolic (congestive) heart failure: Secondary | ICD-10-CM | POA: Diagnosis not present

## 2020-07-23 DIAGNOSIS — I2510 Atherosclerotic heart disease of native coronary artery without angina pectoris: Secondary | ICD-10-CM

## 2020-07-23 DIAGNOSIS — I255 Ischemic cardiomyopathy: Secondary | ICD-10-CM

## 2020-07-23 DIAGNOSIS — D6869 Other thrombophilia: Secondary | ICD-10-CM | POA: Diagnosis not present

## 2020-07-23 LAB — CUP PACEART INCLINIC DEVICE CHECK
Battery Remaining Longevity: 61 mo
Brady Statistic RV Percent Paced: 0.33 %
Date Time Interrogation Session: 20210816131904
HighPow Impedance: 74.25 Ohm
Implantable Lead Implant Date: 20160323
Implantable Lead Location: 753860
Implantable Pulse Generator Implant Date: 20160323
Lead Channel Impedance Value: 387.5 Ohm
Lead Channel Pacing Threshold Amplitude: 0.75 V
Lead Channel Pacing Threshold Pulse Width: 0.5 ms
Lead Channel Sensing Intrinsic Amplitude: 11.9 mV
Lead Channel Setting Pacing Amplitude: 2.5 V
Lead Channel Setting Pacing Pulse Width: 0.5 ms
Lead Channel Setting Sensing Sensitivity: 0.5 mV
Pulse Gen Serial Number: 7255600

## 2020-07-23 NOTE — Progress Notes (Signed)
PCP: Martha Clan, MD Primary Cardiologist: Dr Gala Romney Primary EP: Dr Johney Frame  Kyle Bullock is a 69 y.o. male who presents today for routine electrophysiology followup.  Since last being seen in our clinic, the patient reports doing very well. He did have atrial flutter with RVR in June for which he presented to the ED.  He was started on amiodarone and has had no further episodes. Today, he denies symptoms of palpitations, chest pain, shortness of breath,  lower extremity edema, dizziness, presyncope, syncope, or ICD shocks.  The patient is otherwise without complaint today.   Past Medical History:  Diagnosis Date  . Abnormal TSH    a. 11/2014: felt d/t sick euthyroid.  Marland Kitchen AICD (automatic cardioverter/defibrillator) present 02/28/2015  . CAD (coronary artery disease)    a. 11/2014: arrest/STEMI s/p Xience DES to the LAD and PTCA to the D1 [place], [place]). b. Relook cath 11/2014: no culprit, patent stent.  . CHF (congestive heart failure) (HCC)   . Dressler's syndrome (HCC)    a. 11/2014: tx with colchicine and prednisone.  . Dyslipidemia   . Dyspnea    a. 11/2014: possibly due to combo of CHF and Brilinta. Brilinta changed to Effient.  . Erectile dysfunction    a. Pt aware not to take ED med within 24 hr of NTG and vice versa.  . Hypertension   . Ischemic cardiomyopathy    a. 11/2014: EF reportedly 20% by cath and echo in [place].  Marland Kitchen NSVT (nonsustained ventricular tachycardia) (HCC) 11/16/14   a. 11/2014: admitted after discharge from STEMI admission, 18 beats 135-140bpm in ED.  Marland Kitchen Pericardial effusion    a. 11/2014.  Marland Kitchen Pericarditis dx'd 11/2014  . Pneumonia 1990's X 2  . RBBB   . Sleep apnea    suspected but not diagnosed (02/28/2015)  . STEMI (ST elevation myocardial infarction) (HCC) 11/10/2014  . Tobacco abuse   . VF (ventricular fibrillation) (HCC) 11/10/14   a. 11/2014: arrest with STEMI.   Past Surgical History:  Procedure Laterality Date  . CARDIAC  CATHETERIZATION  11/19/2014  . CARDIAC DEFIBRILLATOR PLACEMENT  02/28/2015  . CORONARY ANGIOPLASTY WITH STENT PLACEMENT  11/10/2014   LAD DES  . HAND SURGERY Bilateral    Dupuytren's Contracture  . IMPLANTABLE CARDIOVERTER DEFIBRILLATOR IMPLANT N/A 02/28/2015   SJM Fortify Assura VR ICD implanted by Dr Johney Frame.  Revascularized following VF arrest, EF remained depressed and ICD implanted  . KNEE ARTHROSCOPY Right 1982  . LEFT HEART CATH N/A 11/19/2014   Procedure: LEFT HEART CATH;  Surgeon: Runell Gess, MD;  Location: [facility] CATH LAB;  Service: Cardiovascular;  Laterality: N/A;    ROS- all systems are reviewed and negative except as per HPI above  Current Outpatient Medications  Medication Sig Dispense Refill  . ANORO ELLIPTA 62.5-25 MCG/INH AEPB Inhale 1 puff into the lungs daily.  11  . apixaban (ELIQUIS) 5 MG TABS tablet Take 1 tablet (5 mg total) by mouth 2 (two) times daily. 60 tablet 6  . atorvastatin (LIPITOR) 20 MG tablet TAKE 1 TABLET BY MOUTH DAILY AT 6 PM 90 tablet 0  . carvedilol (COREG) 3.125 MG tablet Take 3.125 mg by mouth 2 (two) times daily.    . colchicine 0.6 MG tablet Take 0.6 mg by mouth daily as needed.    . empagliflozin (JARDIANCE) 10 MG TABS tablet Take 10 mg by mouth daily before breakfast. 30 tablet 11  . furosemide (LASIX) 20 MG tablet Take 1 tablet (20 mg total)  by mouth daily. 90 tablet 3  . levothyroxine (SYNTHROID) 75 MCG tablet Take 75 mcg by mouth daily before breakfast.    . loratadine (CLARITIN) 10 MG tablet Take 10 mg by mouth daily.    . nitroGLYCERIN (NITROSTAT) 0.4 MG SL tablet Place 1 tablet (0.4 mg total) under the tongue every 5 (five) minutes as needed for chest pain (MAX 3 TABLETS). Reported on 12/13/2015 10 tablet 3  . sacubitril-valsartan (ENTRESTO) 24-26 MG Take 1 tablet by mouth 2 (two) times daily. 180 tablet 1  . spironolactone (ALDACTONE) 25 MG tablet Take 1 tablet (25 mg total) by mouth daily. Please schedule an appointment 90 tablet 1  .  VASCEPA 1 g capsule Take 2 g by mouth daily.      No current facility-administered medications for this visit.    Physical Exam: Vitals:   07/23/20 1253  BP: 102/60  Pulse: (!) 50  SpO2: 96%  Weight: 207 lb (93.9 kg)  Height: 6\' 3"  (1.905 m)    GEN- The patient is well appearing, alert and oriented x 3 today.   Head- normocephalic, atraumatic Eyes-  Sclera clear, conjunctiva pink Ears- hearing intact Oropharynx- clear Lungs- Clear to ausculation bilaterally, normal work of breathing Chest- ICD pocket is well healed Heart- Regular rate and rhythm, no murmurs, rubs or gallops, PMI not laterally displaced GI- soft, NT, ND, + BS Extremities- no clubbing, cyanosis, or edema  ICD interrogation- reviewed in detail today,  See PACEART report  ekg tracing ordered today is personally reviewed and shows sinus bradycardia 50 bpm, PR 204 msec, QTc 463, RBBB  Wt Readings from Last 3 Encounters:  07/23/20 207 lb (93.9 kg)  06/12/20 207 lb 12.8 oz (94.3 kg)  06/04/20 209 lb (94.8 kg)   Echo 05/16/20 is reviewed (EF 20%), moderate LA enlargement ekg 06/01/20 reveals typical atrial flutter with 2:1 AV conduction  Assessment and Plan:  1.  Chronic systolic dysfunction/ CAD/ ischemic CM euvolemic today No CHF symptoms Stable on an appropriate medical regimen Normal ICD function See Pace Art report No changes today he is not device dependant today  2. Typical atrial flutter Recent diagnosis Started on amiodarone Seen in the AF clinic Started on eliquis for chads2vasc score of 3 He has not had afib previously though moderate LA enlargement is noted on echo Now back in sinus Therapeutic strategies for typical atrial flutter including medicine (amiodarone)  and ablation were discussed in detail with the patient today. Risk, benefits, and alternatives to EP study and radiofrequency ablation were also discussed in detail today. These risks include but are not limited to stroke, bleeding,  vascular damage, tamponade, perforation, damage to the heart and other structures, AV block requiring pacemaker, worsening renal function, and death. The patient understands these risk and wishes to proceed.  We will therefore proceed with catheter ablation at the next available time.  Anesthesia is requested. We will stop amiodarone at this time. Today, we discussed that given LA enlargement, he may eventually have afib.  As he has not previously had afib detected, I think that it is very reasonable to stop amiodarone and proceed with atrial flutter ablation.  3. Tobacco Remains quite  4. COPD Followed by pulmonary,  Very mild by PFTs previously  Risks, benefits and potential toxicities for medications prescribed and/or refilled reviewed with patient today.   06/03/20 MD, [facility] 07/23/2020 2:49 PM

## 2020-07-23 NOTE — Patient Instructions (Addendum)
Medication Instructions:  1 Stop Amiodarone  *If you need a refill on your cardiac medications before your next appointment, please call your pharmacy*  Lab Work: None ordered.  If you have labs (blood work) drawn today and your tests are completely normal, you will receive your results only by: Marland Kitchen MyChart Message (if you have MyChart) OR . A paper copy in the mail If you have any lab test that is abnormal or we need to change your treatment, we will call you to review the results.  Testing/Procedures: Aflutter Ablations Your physician has recommended that you have an ablation. Catheter ablation is a medical procedure used to treat some cardiac arrhythmias (irregular heartbeats). During catheter ablation, a long, thin, flexible tube is put into a blood vessel in your groin (upper thigh), or neck. This tube is called an ablation catheter. It is then guided to your heart through the blood vessel. Radio frequency waves destroy small areas of heart tissue where abnormal heartbeats may cause an arrhythmia to start. Please see the instruction sheet given to you today.   Follow-Up: At [facility] you and your health needs are our priority.  As part of our continuing mission to provide you with exceptional heart care, we have created designated Provider Care Teams.  These Care Teams include your primary Cardiologist (physician) and Advanced Practice Providers (APPs -  Physician Assistants and Nurse Practitioners) who all work together to provide you with the care you need, when you need it.  We recommend signing up for the patient portal called "MyChart".  Sign up information is provided on this After Visit Summary.  MyChart is used to connect with patients for Virtual Visits (Telemedicine).  Patients are able to view lab/test results, encounter notes, upcoming appointments, etc.  Non-urgent messages can be sent to your provider as well.   To learn more about what you can do with MyChart, go to  [email or web address].     Remote monitoring is used to monitor your ICD from home. This monitoring reduces the number of office visits required to check your device to one time per year. It allows [place] to keep an eye on the functioning of your device to ensure it is working properly. You are scheduled for a device check from home on 09/04/2020. You may send your transmission at any time that day. If you have a wireless device, the transmission will be sent automatically. After your physician reviews your transmission, you will receive a postcard with your next transmission date.  Other Instructions:

## 2020-08-17 ENCOUNTER — Other Ambulatory Visit: Payer: PPO

## 2020-08-21 ENCOUNTER — Other Ambulatory Visit: Payer: Self-pay

## 2020-08-21 ENCOUNTER — Other Ambulatory Visit: Payer: PPO

## 2020-08-21 DIAGNOSIS — I255 Ischemic cardiomyopathy: Secondary | ICD-10-CM

## 2020-08-21 DIAGNOSIS — I483 Typical atrial flutter: Secondary | ICD-10-CM | POA: Diagnosis not present

## 2020-08-22 LAB — CBC WITH DIFFERENTIAL/PLATELET
Basophils Absolute: 0.1 10*3/uL (ref 0.0–0.2)
Basos: 1 %
EOS (ABSOLUTE): 0.2 10*3/uL (ref 0.0–0.4)
Eos: 3 %
Hematocrit: 44.6 % (ref 37.5–51.0)
Hemoglobin: 14.8 g/dL (ref 13.0–17.7)
Immature Grans (Abs): 0 10*3/uL (ref 0.0–0.1)
Immature Granulocytes: 0 %
Lymphocytes Absolute: 1.2 10*3/uL (ref 0.7–3.1)
Lymphs: 17 %
MCH: 30.2 pg (ref 26.6–33.0)
MCHC: 33.2 g/dL (ref 31.5–35.7)
MCV: 91 fL (ref 79–97)
Monocytes Absolute: 0.8 10*3/uL (ref 0.1–0.9)
Monocytes: 11 %
Neutrophils Absolute: 4.8 10*3/uL (ref 1.4–7.0)
Neutrophils: 68 %
Platelets: 263 10*3/uL (ref 150–450)
RBC: 4.9 x10E6/uL (ref 4.14–5.80)
RDW: 13.1 % (ref 11.6–15.4)
WBC: 7 10*3/uL (ref 3.4–10.8)

## 2020-08-22 LAB — BASIC METABOLIC PANEL
BUN/Creatinine Ratio: 12 (ref 10–24)
BUN: 20 mg/dL (ref 8–27)
CO2: 25 mmol/L (ref 20–29)
Calcium: 9.6 mg/dL (ref 8.6–10.2)
Chloride: 101 mmol/L (ref 96–106)
Creatinine, Ser: 1.65 mg/dL — ABNORMAL HIGH (ref 0.76–1.27)
GFR calc Af Amer: 48 mL/min/{1.73_m2} — ABNORMAL LOW (ref >59)
GFR calc non Af Amer: 41 mL/min/{1.73_m2} — ABNORMAL LOW (ref >59)
Glucose: 88 mg/dL (ref 65–99)
Potassium: 4.7 mmol/L (ref 3.5–5.2)
Sodium: 139 mmol/L (ref 134–144)

## 2020-08-27 DIAGNOSIS — J029 Acute pharyngitis, unspecified: Secondary | ICD-10-CM | POA: Diagnosis not present

## 2020-08-31 ENCOUNTER — Other Ambulatory Visit (HOSPITAL_COMMUNITY)
Admission: RE | Admit: 2020-08-31 | Discharge: 2020-08-31 | Disposition: A | Discharge status: Home / Self Care | Payer: PPO | Source: Ambulatory Visit | Attending: Internal Medicine | Admitting: Internal Medicine

## 2020-08-31 DIAGNOSIS — Z01812 Encounter for preprocedural laboratory examination: Secondary | ICD-10-CM | POA: Diagnosis not present

## 2020-08-31 DIAGNOSIS — Z20822 Contact with and (suspected) exposure to covid-19: Secondary | ICD-10-CM | POA: Insufficient documentation

## 2020-08-31 LAB — SARS CORONAVIRUS 2 (TAT 6-24 HRS): SARS Coronavirus 2: NEGATIVE

## 2020-09-02 ENCOUNTER — Emergency Department (HOSPITAL_COMMUNITY): Payer: PPO

## 2020-09-02 ENCOUNTER — Emergency Department (HOSPITAL_COMMUNITY)
Admission: EM | Admit: 2020-09-02 | Discharge: 2020-09-03 | Disposition: A | Discharge status: Home / Self Care | Payer: PPO | Attending: Emergency Medicine | Admitting: Emergency Medicine

## 2020-09-02 ENCOUNTER — Other Ambulatory Visit: Payer: Self-pay

## 2020-09-02 DIAGNOSIS — Z96651 Presence of right artificial knee joint: Secondary | ICD-10-CM | POA: Diagnosis not present

## 2020-09-02 DIAGNOSIS — I110 Hypertensive heart disease with heart failure: Secondary | ICD-10-CM | POA: Insufficient documentation

## 2020-09-02 DIAGNOSIS — Z79899 Other long term (current) drug therapy: Secondary | ICD-10-CM | POA: Insufficient documentation

## 2020-09-02 DIAGNOSIS — Z87891 Personal history of nicotine dependence: Secondary | ICD-10-CM | POA: Diagnosis not present

## 2020-09-02 DIAGNOSIS — I5022 Chronic systolic (congestive) heart failure: Secondary | ICD-10-CM | POA: Insufficient documentation

## 2020-09-02 DIAGNOSIS — J439 Emphysema, unspecified: Secondary | ICD-10-CM | POA: Diagnosis not present

## 2020-09-02 DIAGNOSIS — R0902 Hypoxemia: Secondary | ICD-10-CM | POA: Diagnosis not present

## 2020-09-02 DIAGNOSIS — I4891 Unspecified atrial fibrillation: Secondary | ICD-10-CM | POA: Diagnosis not present

## 2020-09-02 DIAGNOSIS — I2510 Atherosclerotic heart disease of native coronary artery without angina pectoris: Secondary | ICD-10-CM | POA: Insufficient documentation

## 2020-09-02 DIAGNOSIS — I499 Cardiac arrhythmia, unspecified: Secondary | ICD-10-CM | POA: Diagnosis not present

## 2020-09-02 DIAGNOSIS — I4892 Unspecified atrial flutter: Secondary | ICD-10-CM

## 2020-09-02 DIAGNOSIS — I517 Cardiomegaly: Secondary | ICD-10-CM | POA: Diagnosis not present

## 2020-09-02 DIAGNOSIS — R000 Tachycardia, unspecified: Secondary | ICD-10-CM | POA: Diagnosis not present

## 2020-09-02 DIAGNOSIS — J80 Acute respiratory distress syndrome: Secondary | ICD-10-CM | POA: Diagnosis not present

## 2020-09-02 DIAGNOSIS — R079 Chest pain, unspecified: Secondary | ICD-10-CM | POA: Diagnosis present

## 2020-09-02 LAB — CBC
HCT: 42.2 % (ref 39.0–52.0)
Hemoglobin: 13.9 g/dL (ref 13.0–17.0)
MCH: 30.6 pg (ref 26.0–34.0)
MCHC: 32.9 g/dL (ref 30.0–36.0)
MCV: 93 fL (ref 80.0–100.0)
Platelets: 276 10*3/uL (ref 150–400)
RBC: 4.54 MIL/uL (ref 4.22–5.81)
RDW: 13.6 % (ref 11.5–15.5)
WBC: 7.2 10*3/uL (ref 4.0–10.5)
nRBC: 0 % (ref 0.0–0.2)

## 2020-09-02 LAB — MAGNESIUM: Magnesium: 2 mg/dL (ref 1.7–2.4)

## 2020-09-02 LAB — BASIC METABOLIC PANEL
Anion gap: 14 (ref 5–15)
BUN: 20 mg/dL (ref 8–23)
CO2: 20 mmol/L — ABNORMAL LOW (ref 22–32)
Calcium: 8.6 mg/dL — ABNORMAL LOW (ref 8.9–10.3)
Chloride: 103 mmol/L (ref 98–111)
Creatinine, Ser: 1.31 mg/dL — ABNORMAL HIGH (ref 0.61–1.24)
GFR calc Af Amer: >60 mL/min (ref >60)
GFR calc non Af Amer: 55 mL/min — ABNORMAL LOW (ref >60)
Glucose, Bld: 99 mg/dL (ref 70–99)
Potassium: 3.5 mmol/L (ref 3.5–5.1)
Sodium: 137 mmol/L (ref 135–145)

## 2020-09-02 LAB — CBG MONITORING, ED: Glucose-Capillary: 88 mg/dL (ref 70–99)

## 2020-09-02 LAB — TROPONIN I (HIGH SENSITIVITY): Troponin I (High Sensitivity): 136 ng/L (ref <18)

## 2020-09-02 MED ORDER — PROPOFOL 10 MG/ML IV BOLUS
0.5000 mg/kg | Freq: Once | INTRAVENOUS | Status: AC
  Administered 2020-09-03: 46.5 mg via INTRAVENOUS
  Filled 2020-09-02: qty 20

## 2020-09-02 NOTE — ED Provider Notes (Signed)
[facility] EMERGENCY [facility]MENT Provider Note   CSN: [id number] Arrival date & time: 09/02/20  2234     History Chief Complaint  Patient presents with  . Chest Pain  . Atrial Fibrillation    Jabier MuttonJonathan D Shatzer is a 69 y.o. male with history of CAD, acute MI in 2015 complicated by v fib arrest, ischemic cardiomyopathy 20-25% with St. Jude single chamber ICD implanted on 02/28/15 who presents the emergency department by EMS from home with a chief complaint of rapid heart rate.  The patient reports that he has been feeling "off" for the last couple of days.  He reports that his heart has been racing since he awoke this AM accompanied by dyspnea with exertion.  Earlier today, he had an episode of chest pain that he describes as pressure-like, like "an elephant was sitting on his chest, which has since resolved.  He denies CP at this time. His wife attempted to check his blood pressure and pulse at home and found his heart rate to be very high.   He denies syncope, dizziness, lightheadedness, abdominal pain, nausea, vomiting, diarrhea, headache, visual changes, fever, chills, leg swelling, abdominal distention, orthopnea, or PND.  Reports that his weights have been stable between 204-206 lbs at home.   He was given NTG x2, 325 mg ASA, and 700 mL of normal saline in route by EMS.  He is followed by Dr. Gala RomneyBensimhon and Dr. Johney FrameAllred. He is scheduled for a cardiac ablation with Dr. Johney FrameAllred on 9/28.  He is on Eliquis.  He has been compliant with all of his home medications with no recent missed doses. No history of cardioversion.  Per chart review, he was seen in the ER on 6/25 for atrial flutter with RVR and was discharged with amiodarone.  The history is provided by the patient. No language interpreter was used.       Past Medical History:  Diagnosis Date  . Abnormal TSH    a. 11/2014: felt d/t sick euthyroid.  Marland Kitchen. AICD (automatic cardioverter/defibrillator) present 02/28/2015  .  CAD (coronary artery disease)    a. 11/2014: arrest/STEMI s/p Xience DES to the LAD and PTCA to the D1 [place] b. Relook cath 11/2014: no culprit, patent stent.  . CHF (congestive heart failure) (HCC)   . Dressler's syndrome (HCC)    a. 11/2014: tx with colchicine and prednisone.  . Dyslipidemia   . Dyspnea    a. 11/2014: possibly due to combo of CHF and Brilinta. Brilinta changed to Effient.  . Erectile dysfunction    a. Pt aware not to take ED med within 24 hr of NTG and vice versa.  . Hypertension   . Ischemic cardiomyopathy    a. 11/2014: EF [place] 20[place]  Marland Kitchen. NSVT (nonsustained ventricular tachycardia) (HCC) 11/16/14   a. 11/2014: admitted after discharge from STEMI admission, 18 beats 135-140bpm in ED.  Marland Kitchen. Pericardial effusion    a. 11/2014.  Marland Kitchen. Pericarditis dx'd 11/2014  . Pneumonia 1990's X 2  . RBBB   . Sleep apnea    suspected but not diagnosed (02/28/2015)  . STEMI (ST elevation myocardial infarction) (HCC) 11/10/2014  . Tobacco abuse   . VF (ventricular fibrillation) (HCC) 11/10/14   a. 11/2014: arrest with STEMI.    Patient Active Problem List   Diagnosis Date Noted  . Chronic systolic dysfunction of left ventricle 08/27/2015  . Arrhythmia 07/05/2015  . Multiple pulmonary nodules 06/15/2015  . Centrilobular emphysema (  HCC) 06/05/2015  . Abnormal CT scan, chest 06/05/2015  . Pericardial effusion 11/26/2014  . Dressler's syndrome (HCC) 11/25/2014  . Hypotension 11/24/2014  . Precordial pain 11/24/2014  . Chest pain 11/24/2014  . Chronic systolic CHF (congestive heart failure) (HCC) 11/23/2014  . History of tobacco abuse 11/23/2014  . ST elevation myocardial infarction (STEMI) involving left anterior descending (LAD) coronary artery with complication (HCC)   . RBBB 11/16/2014  . HTN (hypertension) 11/16/2014  . Dyslipidemia 11/16/2014  . CAD S/P urgent LAD DES, POBA to Dx1 11/10/14 [facility]) 11/16/2014  . Ischemic  cardiomyopathy- 20%-Life Vest 11/16/2014  . Dyspnea 11/16/2014  . NSVT (nonsustained ventricular tachycardia) (HCC) 11/16/2014  . VF arrest- shocked x 1 11/10/14 11/16/2014    Past Surgical History:  Procedure Laterality Date  . CARDIAC CATHETERIZATION  11/19/2014  . CARDIAC DEFIBRILLATOR PLACEMENT  02/28/2015  . CORONARY ANGIOPLASTY WITH STENT PLACEMENT  11/10/2014   LAD DES  . HAND SURGERY Bilateral    Dupuytren's Contracture  . IMPLANTABLE CARDIOVERTER DEFIBRILLATOR IMPLANT N/A 02/28/2015   SJM Fortify Assura VR ICD implanted by Dr Johney Frame.  Revascularized following VF arrest, EF remained depressed and ICD implanted  . KNEE ARTHROSCOPY Right 1982  . LEFT HEART CATH N/A 11/19/2014   Procedure: LEFT HEART CATH;  Surgeon: Runell Gess, MD;  Location: [facility] CATH LAB;  Service: Cardiovascular;  Laterality: N/A;       Family History  Problem Relation Age of Onset  . COPD Mother   . Hypertension Mother   . Hypertension Father   . CAD Neg Hx     Social History   Tobacco Use  . Smoking status: Former Smoker    Packs/day: 2.00    Years: 40.00    Pack years: 80.00    Types: Cigarettes    Quit date: 11/10/2014    Years since quitting: 5.8  . Smokeless tobacco: Never Used  Vaping Use  . Vaping Use: Never used  Substance Use Topics  . Alcohol use: Yes    Alcohol/week: 14.0 standard drinks    Types: 14 Glasses of wine per week    Comment: 02/28/2015 "2, 4oz glasses of wine/night"  . Drug use: No    Home Medications Prior to Admission medications   Medication Sig Start Date End Date Taking? Authorizing Provider  acetaminophen (TYLENOL) 500 MG tablet Take 1,000 mg by mouth every 6 (six) hours as needed for moderate pain.   Yes [provider]  ANORO ELLIPTA 62.5-25 MCG/INH AEPB Inhale 1 puff into the lungs daily. 08/21/18  Yes [provider]  apixaban (ELIQUIS) 5 MG TABS tablet Take 5 mg by mouth 2 (two) times daily.   Yes [provider]    atorvastatin (LIPITOR) 20 MG tablet TAKE 1 TABLET BY MOUTH DAILY AT 6 PM Patient taking differently: Take 20 mg by mouth at bedtime.  05/29/20  Yes [name], Bevelyn Buckles, MD  carvedilol (COREG) 6.25 MG tablet Take 3.125 mg by mouth 2 (two) times daily with a meal.   Yes [provider]  colchicine 0.6 MG tablet Take 0.6 mg by mouth daily as needed (gout flare).  12/27/19  Yes [provider]  empagliflozin (JARDIANCE) 10 MG TABS tablet Take 10 mg by mouth daily before breakfast. 10/07/19  Yes [name], Bevelyn Buckles, MD  furosemide (LASIX) 20 MG tablet Take 1 tablet (20 mg total) by mouth daily. Patient taking differently: Take 20 mg by mouth daily as needed for edema.  02/24/20  Yes [name],  Bevelyn Buckles, MD  levothyroxine (SYNTHROID) 75 MCG tablet Take 75 mcg by mouth daily before breakfast.   Yes [provider]  loratadine (CLARITIN) 10 MG tablet Take 10 mg by mouth daily.   Yes [provider]  nitroGLYCERIN (NITROSTAT) 0.4 MG SL tablet Place 1 tablet (0.4 mg total) under the tongue every 5 (five) minutes as needed for chest pain (MAX 3 TABLETS). Reported on 12/13/2015 08/23/18  Yes Allred, Fayrene Fearing, MD  sacubitril-valsartan (ENTRESTO) 24-26 MG Take 1 tablet by mouth 2 (two) times daily. 02/24/20  Yes [name], Bevelyn Buckles, MD  spironolactone (ALDACTONE) 25 MG tablet Take 1 tablet (25 mg total) by mouth daily. Please schedule an appointment 04/25/20  Yes [name], Bevelyn Buckles, MD  VASCEPA 1 g capsule Take 2 g by mouth 2 (two) times daily.  05/13/20  Yes [provider]    Allergies    Ambien [zolpidem tartrate], Lisinopril, and Brilinta [ticagrelor]  Review of Systems   Review of Systems  Constitutional: Negative for appetite change, chills, fever and unexpected weight change.  HENT: Negative for congestion and sore throat.   Eyes: Negative for visual disturbance.  Respiratory: Positive for shortness of breath. Negative for cough, chest tightness and wheezing.    Cardiovascular: Positive for chest pain (resolved) and palpitations. Negative for leg swelling.  Gastrointestinal: Negative for abdominal pain, diarrhea, nausea and vomiting.  Genitourinary: Negative for dysuria.  Musculoskeletal: Negative for back pain, gait problem, myalgias, neck pain and neck stiffness.  Skin: Negative for rash.  Allergic/Immunologic: Negative for immunocompromised state.  Neurological: Negative for dizziness, seizures, syncope, weakness, light-headedness and headaches.  Psychiatric/Behavioral: Negative for confusion.    Physical Exam Updated Vital Signs BP 108/71   Pulse 77   Temp 97.9 F (36.6 C) (Oral)   Resp (!) 22   Ht  (1.905 m)   Wt 93 kg   SpO2 99%   BMI 25.62 kg/m   Physical Exam Vitals and nursing note reviewed.  Constitutional:      General: He is not in acute distress.    Appearance: He is well-developed. He is not ill-appearing or diaphoretic.  HENT:     Head: Normocephalic.  Eyes:     Conjunctiva/sclera: Conjunctivae normal.  Cardiovascular:     Rate and Rhythm: Tachycardia present. Rhythm irregular.     Pulses:          Radial pulses are 2+ on the right side and 2+ on the left side.       Dorsalis pedis pulses are 2+ on the right side and 2+ on the left side.     Heart sounds: Normal heart sounds. No murmur heard.  No friction rub. No gallop.   Pulmonary:     Effort: Pulmonary effort is normal. No tachypnea or respiratory distress.     Breath sounds: No stridor. No wheezing, rhonchi or rales.     Comments: Speaks in complete, fluent sentences without increased work of breathing. Chest:     Chest wall: No tenderness.  Abdominal:     General: There is no distension.     Palpations: Abdomen is soft. There is no mass.     Tenderness: There is no abdominal tenderness. There is no right CVA tenderness, left CVA tenderness, guarding or rebound.     Hernia: No hernia is present.  Musculoskeletal:     Cervical back: Neck supple.      Right lower leg: No edema.     Left lower leg: No edema.  Skin:    General: Skin is warm and dry.  Neurological:     Mental Status: He is alert.  Psychiatric:        Behavior: Behavior normal.     ED Results / Procedures / Treatments   Labs (all labs ordered are listed, but only abnormal results are displayed) Labs Reviewed  BASIC METABOLIC PANEL - Abnormal; Notable for the following components:      Result Value   CO2 20 (*)    Creatinine, Ser 1.31 (*)    Calcium 8.6 (*)    GFR calc non Af Amer 55 (*)    All other components within normal limits  TROPONIN I (HIGH SENSITIVITY) - Abnormal; Notable for the following components:   Troponin I (High Sensitivity) 136 (*)    All other components within normal limits  TROPONIN I (HIGH SENSITIVITY) - Abnormal; Notable for the following components:   Troponin I (High Sensitivity) 168 (*)    All other components within normal limits  CBC  MAGNESIUM  CBG MONITORING, ED    EKG Interpretation  Date/Time: 09/02/2020 22:51:42   Ventricular Rate: 145   PR Interval: *   QRS Duration: 149   QT Interval: 319   QTC Calculation: 496   R Axis:  87   Text Interpretation: Tachycardia, no definite P wave. Right BBB; age indeterminate anterolateral infarct.      Radiology DG Chest Portable 1 View  Result Date: 09/02/2020 CLINICAL DATA:  Chest pain, atrial fibrillation with rapid ventricular response EXAM: PORTABLE CHEST 1 VIEW COMPARISON:  Radiograph 07/01/2020, CT 02/06/2020 FINDINGS: Fine reticular opacities throughout the lungs are similar to comparison some likely reflect the extensive centrilobular emphysematous changes seen on comparison cross-sectional imaging. Mild central vascular congestion without features of frank edema. No consolidation. No pneumothorax or effusion. Cardiomegaly is similar to prior with a calcified aorta. Pacer/defibrillator pack overlies the left chest wall lead in stable position directed towards the cardiac  apex. Coronary stenting is noted. No acute osseous or soft tissue abnormality. Telemetry leads overlie the chest. IMPRESSION: 1. Mild central vascular congestion without features of frank edema. 2. Fine reticular opacities throughout the lungs are similar to comparison likely reflect the extensive centrilobular emphysematous changes seen on comparison cross-sectional imaging. 3. Stable mild cardiomegaly. 4.  Aortic Atherosclerosis (ICD10-I70.0). Electronically Signed   By: Kreg Shropshire M.D.   On: 09/02/2020 23:13    Procedures .Cardioversion  Date/Time: 09/03/2020 12:15 AM Performed by: Barkley Boards, PA-C Authorized by: Barkley Boards, PA-C   Consent:    Consent obtained:  Verbal and written   Consent given by:  Patient   Risks discussed:  Death, induced arrhythmia, pain and cutaneous burn   Alternatives discussed:  Rate-control medication and observation Pre-procedure details:    Cardioversion basis:  Emergent   Rhythm:  Atrial flutter   Electrode placement:  Anterior-posterior Patient sedated: Yes. Refer to sedation procedure documentation for details of sedation.  Attempt one:    Cardioversion mode:  Synchronous   Waveform:  Biphasic   Shock (Joules):  120   Shock outcome:  Conversion to normal sinus rhythm Post-procedure details:    Patient status:  Awake   Patient tolerance of procedure:  Tolerated well, no immediate complications  .Critical Care Performed by: Barkley Boards, PA-C Authorized by: Barkley Boards, PA-C   Critical care provider statement:    Critical care time (minutes):  40   Critical care time was exclusive of:  Separately billable procedures and  treating other patients and teaching time   Critical care was necessary to treat or prevent imminent or life-threatening deterioration of the following conditions:  Cardiac failure   Critical care was time spent personally by me on the following activities:  Obtaining history from patient or surrogate,  examination of patient, ordering and performing treatments and interventions, ordering and review of laboratory studies, ordering and review of radiographic studies, pulse oximetry, re-evaluation of patient's condition, review of old charts, discussions with consultants, evaluation of patient's response to treatment and development of treatment plan with patient or surrogate   I assumed direction of critical care for this patient from another provider in my specialty: no     (including critical care time)  Medications Ordered in ED Medications  propofol (DIPRIVAN) 10 mg/mL bolus/IV push 46.5 mg (46.5 mg Intravenous Given 09/03/20 0012)  propofol (DIPRIVAN) 10 mg/mL bolus/IV push (46.5 mg Intravenous Given 09/03/20 0012)    ED Course  I have reviewed the triage vital signs and the nursing notes.  Pertinent labs & imaging results that were available during my care of the patient were reviewed by me and considered in my medical decision making (see chart for details).    MDM Rules/Calculators/A&P                          69 year old male with history of CAD, acute MI in 2015 complicated by v fib arrest, ischemic cardiomyopathy 20-25% with St. Jude single chamber ICD implanted on 02/28/15 brought in by EMS for rapid heart rate and dyspnea on exertion, onset when he woke today.  He has been feeling generally unwell for the last couple of days and did have an episode of chest pressure earlier today that has since resolved.  He is scheduled for a cardiac ablation with Dr. Johney Frame on 9/28.  He is anticoagulated with Eliquis with no missed doses.  EKG concerning for 2:1 atrial flutter with RVR.    The patient was seen and independently evaluated by Dr. Preston Fleeting, attending physician.  Patient has was seen in the ER in June 2021 with atrial flutter with RVR and treated with amiodarone.  No history of cardioversion.  CONSULT: Spoke with Dr. Lambert Mody, cardiology, since the patient is scheduled for an  ablation in 2 days.  No contraindications for cardioversion delaying the patient's ablation.  Since the patient is anticoagulated, as long as the patient is not significantly volume overloaded.  Chest x-ray has been reviewed by me and demonstrates emphysematous changes.  There is also mild central vascular congestion with no evidence of frank edema.  He has mild stable cardiomegaly.  Creatinine is 1.31, improved from 1.65 approximately 2 weeks ago.  No metabolic derangements.    Cardioversion successfully performed with Dr. Preston Fleeting performing procedural sedation.  Please see his separate procedure note.  Following the procedure, the patient was in no acute distress, awake, alert, and in NSR with few PVCs. Will plant to send Dr. Johney Frame an inbox message regarding patient's ER visit today.   Troponin is elevated at 136, likely secondary to a flutter with RVR.  Last troponin in June was of 14.  Given that patient did have an episode of chest pain, which is now resolved, earlier in the day will repeat troponin.  Repeat troponin is 168, trend is flat.  Patient has had no further episodes of chest pain or pressure and is feeling much improved from arrival.  Doubt aortic dissection, NSTEMI, CHF exacerbation, COVID-19.  At this time, the patient is hemodynamically stable and safe for discharge to home.   Final Clinical Impression(s) / ED Diagnoses Final diagnoses:  Atrial flutter with rapid ventricular response [facility])    Rx / DC Orders ED Discharge Orders    None       Barkley Boards, PA-C 09/03/20 0253    Dione Booze, MD 09/03/20 [id number]

## 2020-09-02 NOTE — ED Triage Notes (Signed)
Pt arrives via [facility] stretcher c/o CP and a-fib RVR. Pt reports earlier in day pt "felt and elephant on my chest" that resolved on it's own. On EMS arrival pt c/o same and SOB on exertion. EMS gave 2 nitro, 325 mg ASA, 700 mL NS. Pt has pacemaker and reports being scheduled for cardiac ablation on Tuesday this week.   Pt A+Ox4 on arrival.   EMS last VS 124/84, HR 150 (a-fib), 95% on RA, CBG 128

## 2020-09-03 ENCOUNTER — Telehealth (HOSPITAL_COMMUNITY): Payer: Self-pay

## 2020-09-03 ENCOUNTER — Other Ambulatory Visit: Payer: Self-pay

## 2020-09-03 LAB — TROPONIN I (HIGH SENSITIVITY): Troponin I (High Sensitivity): 168 ng/L (ref <18)

## 2020-09-03 MED ORDER — PROPOFOL 10 MG/ML IV BOLUS
INTRAVENOUS | Status: AC | PRN
  Administered 2020-09-03: 46.5 mg via INTRAVENOUS

## 2020-09-03 NOTE — Discharge Instructions (Signed)
Thank you for allowing me to care for you today in the Emergency Department.   I sent a note to Dr. Johney Frame letting him know you were seen tonight in the ER.   Plan on going to your ablation on Tuesday and following any instructions you were given by Dr. Johney Frame or his team as directed.   You should return to the emergency department if you pass out, develop a very rapid heart rate, chest pain, worsening shortness of breath, or other new, concerning symptoms.

## 2020-09-03 NOTE — Sedation Documentation (Signed)
Pt cardioverted with 120 J. NSR with PVCs

## 2020-09-03 NOTE — Sedation Documentation (Signed)
Patient is resting comfortably. 

## 2020-09-03 NOTE — ED Notes (Signed)
Date and time results received: 09/03/20  2253 (use smartphrase ".now" to insert current time)  Test: Troponin Critical Value: 136  Name of Provider Notified: Frederik Pear, PA  Orders Received? Or Actions Taken?: cardioversion planned

## 2020-09-03 NOTE — Progress Notes (Signed)
Instructed patient on the following items: Arrival time 0530 Nothing to eat or drink after midnight No meds AM of procedure Responsible person to drive you home and stay with you for 24 hrs  Have you missed any doses of anti-coagulant on Eliquis-hasn't missed any doses, will take both doses today.

## 2020-09-03 NOTE — Telephone Encounter (Signed)
Patient on ED report. Reached out to patient to schedule follow-up appointment. He stated that he will be having an ablation on 09/28 with Dr. Johney Frame.

## 2020-09-04 ENCOUNTER — Ambulatory Visit (HOSPITAL_COMMUNITY)
Admission: RE | Admit: 2020-09-04 | Discharge: 2020-09-04 | Disposition: A | Discharge status: Home / Self Care | Payer: PPO | Attending: Internal Medicine | Admitting: Internal Medicine

## 2020-09-04 ENCOUNTER — Encounter (HOSPITAL_COMMUNITY)
Admission: RE | Disposition: A | Discharge status: Home / Self Care | Payer: Self-pay | Source: Home / Self Care | Attending: Internal Medicine

## 2020-09-04 ENCOUNTER — Ambulatory Visit (INDEPENDENT_AMBULATORY_CARE_PROVIDER_SITE_OTHER): Payer: PPO | Admitting: Emergency Medicine

## 2020-09-04 ENCOUNTER — Other Ambulatory Visit: Payer: Self-pay

## 2020-09-04 ENCOUNTER — Ambulatory Visit (HOSPITAL_COMMUNITY): Payer: PPO | Admitting: Anesthesiology

## 2020-09-04 ENCOUNTER — Encounter (HOSPITAL_COMMUNITY): Payer: Self-pay | Admitting: Internal Medicine

## 2020-09-04 DIAGNOSIS — I255 Ischemic cardiomyopathy: Secondary | ICD-10-CM | POA: Diagnosis not present

## 2020-09-04 DIAGNOSIS — I252 Old myocardial infarction: Secondary | ICD-10-CM | POA: Diagnosis not present

## 2020-09-04 DIAGNOSIS — I2510 Atherosclerotic heart disease of native coronary artery without angina pectoris: Secondary | ICD-10-CM | POA: Diagnosis not present

## 2020-09-04 DIAGNOSIS — Z955 Presence of coronary angioplasty implant and graft: Secondary | ICD-10-CM | POA: Diagnosis not present

## 2020-09-04 DIAGNOSIS — I4510 Unspecified right bundle-branch block: Secondary | ICD-10-CM | POA: Diagnosis not present

## 2020-09-04 DIAGNOSIS — J432 Centrilobular emphysema: Secondary | ICD-10-CM | POA: Diagnosis not present

## 2020-09-04 DIAGNOSIS — Z95810 Presence of automatic (implantable) cardiac defibrillator: Secondary | ICD-10-CM | POA: Diagnosis not present

## 2020-09-04 DIAGNOSIS — I509 Heart failure, unspecified: Secondary | ICD-10-CM | POA: Diagnosis not present

## 2020-09-04 DIAGNOSIS — Z79899 Other long term (current) drug therapy: Secondary | ICD-10-CM | POA: Diagnosis not present

## 2020-09-04 DIAGNOSIS — I313 Pericardial effusion (noninflammatory): Secondary | ICD-10-CM | POA: Diagnosis not present

## 2020-09-04 DIAGNOSIS — I483 Typical atrial flutter: Secondary | ICD-10-CM | POA: Insufficient documentation

## 2020-09-04 DIAGNOSIS — I4892 Unspecified atrial flutter: Secondary | ICD-10-CM | POA: Diagnosis not present

## 2020-09-04 DIAGNOSIS — I110 Hypertensive heart disease with heart failure: Secondary | ICD-10-CM | POA: Insufficient documentation

## 2020-09-04 DIAGNOSIS — E785 Hyperlipidemia, unspecified: Secondary | ICD-10-CM | POA: Insufficient documentation

## 2020-09-04 DIAGNOSIS — Z79890 Hormone replacement therapy (postmenopausal): Secondary | ICD-10-CM | POA: Insufficient documentation

## 2020-09-04 DIAGNOSIS — Z7901 Long term (current) use of anticoagulants: Secondary | ICD-10-CM | POA: Insufficient documentation

## 2020-09-04 HISTORY — PX: A-FLUTTER ABLATION: EP1230

## 2020-09-04 LAB — CUP PACEART REMOTE DEVICE CHECK
Battery Remaining Longevity: 58 mo
Battery Remaining Percentage: 55 %
Battery Voltage: 2.99 V
Brady Statistic RV Percent Paced: 1 %
Date Time Interrogation Session: 20210928020017
HighPow Impedance: 64 Ohm
HighPow Impedance: 64 Ohm
Implantable Lead Implant Date: 20160323
Implantable Lead Location: 753860
Implantable Pulse Generator Implant Date: 20160323
Lead Channel Impedance Value: 350 Ohm
Lead Channel Pacing Threshold Amplitude: 0.75 V
Lead Channel Pacing Threshold Pulse Width: 0.5 ms
Lead Channel Sensing Intrinsic Amplitude: 11.9 mV
Lead Channel Setting Pacing Amplitude: 2.5 V
Lead Channel Setting Pacing Pulse Width: 0.5 ms
Lead Channel Setting Sensing Sensitivity: 0.5 mV
Pulse Gen Serial Number: 7255600

## 2020-09-04 SURGERY — A-FLUTTER ABLATION
Anesthesia: General

## 2020-09-04 MED ORDER — SODIUM CHLORIDE 0.9% FLUSH: 3.0000 mL | Freq: Two times a day (BID) | INTRAVENOUS | Status: DC

## 2020-09-04 MED ORDER — LIDOCAINE 2% (20 MG/ML) 5 ML SYRINGE
INTRAMUSCULAR | Status: DC | PRN
  Administered 2020-09-04: 60 mg via INTRAVENOUS

## 2020-09-04 MED ORDER — DEXAMETHASONE SODIUM PHOSPHATE 10 MG/ML IJ SOLN
INTRAMUSCULAR | Status: DC | PRN
  Administered 2020-09-04: 10 mg via INTRAVENOUS

## 2020-09-04 MED ORDER — ROCURONIUM BROMIDE 10 MG/ML (PF) SYRINGE
PREFILLED_SYRINGE | INTRAVENOUS | Status: DC | PRN
  Administered 2020-09-04: 90 mg via INTRAVENOUS

## 2020-09-04 MED ORDER — ACETAMINOPHEN 325 MG PO TABS: 650.0000 mg | ORAL_TABLET | ORAL | Status: DC | PRN

## 2020-09-04 MED ORDER — HYDROCODONE-ACETAMINOPHEN 5-325 MG PO TABS: 1.0000 | ORAL_TABLET | ORAL | Status: DC | PRN

## 2020-09-04 MED ORDER — SUGAMMADEX SODIUM 200 MG/2ML IV SOLN
INTRAVENOUS | Status: DC | PRN
  Administered 2020-09-04: 200 mg via INTRAVENOUS

## 2020-09-04 MED ORDER — HEPARIN (PORCINE) IN NACL 1000-0.9 UT/500ML-% IV SOLN
INTRAVENOUS | Status: DC | PRN
  Administered 2020-09-04: 500 mL

## 2020-09-04 MED ORDER — ONDANSETRON HCL 4 MG/2ML IJ SOLN: 4.0000 mg | Freq: Four times a day (QID) | INTRAMUSCULAR | Status: DC | PRN

## 2020-09-04 MED ORDER — SODIUM CHLORIDE 0.9 % IV SOLN: INTRAVENOUS | Status: DC

## 2020-09-04 MED ORDER — PROPOFOL 10 MG/ML IV BOLUS
INTRAVENOUS | Status: DC | PRN
  Administered 2020-09-04: 50 mg via INTRAVENOUS

## 2020-09-04 MED ORDER — PHENYLEPHRINE HCL-NACL 10-0.9 MG/250ML-% IV SOLN
INTRAVENOUS | Status: DC | PRN
  Administered 2020-09-04: 30 ug/min via INTRAVENOUS

## 2020-09-04 MED ORDER — SODIUM CHLORIDE 0.9% FLUSH: 3.0000 mL | INTRAVENOUS | Status: DC | PRN

## 2020-09-04 MED ORDER — ONDANSETRON HCL 4 MG/2ML IJ SOLN
INTRAMUSCULAR | Status: DC | PRN
  Administered 2020-09-04: 4 mg via INTRAVENOUS

## 2020-09-04 MED ORDER — SODIUM CHLORIDE 0.9 % IV SOLN: 250.0000 mL | INTRAVENOUS | Status: DC | PRN

## 2020-09-04 MED ORDER — APIXABAN 5 MG PO TABS
5.0000 mg | ORAL_TABLET | Freq: Once | ORAL | Status: AC
  Administered 2020-09-04: 5 mg via ORAL
  Filled 2020-09-04 (×2): qty 1

## 2020-09-04 MED ORDER — FENTANYL CITRATE (PF) 250 MCG/5ML IJ SOLN
INTRAMUSCULAR | Status: DC | PRN
Start: 2020-09-04 — End: 2020-09-04
  Administered 2020-09-04: 100 ug via INTRAVENOUS

## 2020-09-04 MED ORDER — ETOMIDATE 2 MG/ML IV SOLN
INTRAVENOUS | Status: DC | PRN
  Administered 2020-09-04: 14 mg via INTRAVENOUS

## 2020-09-04 SURGICAL SUPPLY — 12 items
BLANKET WARM UNDERBOD FULL ACC (MISCELLANEOUS) ×1 IMPLANT
CATH EZ STEER NAV 8MM F-J CUR (ABLATOR) ×1 IMPLANT
CATH WEBSTER BI DIR CS D-F CRV (CATHETERS) ×1 IMPLANT
DEVICE CLOSURE PERCLS PRGLD 6F (VASCULAR PRODUCTS) IMPLANT
PACK EP LATEX FREE (CUSTOM PROCEDURE TRAY) ×2
PACK EP LF (CUSTOM PROCEDURE TRAY) ×1 IMPLANT
PAD PRO RADIOLUCENT 2001M-C (PAD) ×2 IMPLANT
PATCH CARTO3 (PAD) ×1 IMPLANT
PERCLOSE PROGLIDE 6F (VASCULAR PRODUCTS) ×4
SHEATH PINNACLE 7F 10CM (SHEATH) ×1 IMPLANT
SHEATH PINNACLE 8F 10CM (SHEATH) ×1 IMPLANT
SHEATH PROBE COVER 6X72 (BAG) ×1 IMPLANT

## 2020-09-04 NOTE — Transfer of Care (Signed)
Immediate Anesthesia Transfer of Care Note  Patient: Kyle Bullock  Procedure(s) Performed: A-FLUTTER ABLATION (N/A )  Patient Location: Cath Lab  Anesthesia Type:General  Level of Consciousness: awake, alert  and oriented  Airway & Oxygen Therapy: Patient Spontanous Breathing  Post-op Assessment: Report given to RN, Post -op Vital signs reviewed and stable and Patient moving all extremities  Post vital signs: Reviewed and stable  Last Vitals:  Vitals Value Taken Time  BP 113/64 09/04/20 0920  Temp    Pulse 59 09/04/20 0924  Resp 14 09/04/20 0924  SpO2 95 % 09/04/20 0924  Vitals shown include unvalidated device data.  Last Pain:  Vitals:   09/04/20 0924  TempSrc:   PainSc: 0-No pain         Complications: No complications documented.

## 2020-09-04 NOTE — Anesthesia Procedure Notes (Signed)
Arterial Line Insertion Start/End9/28/2021 7:35 AM, 09/04/2020 7:45 AM Performed by: Quentin Ore, CRNA, CRNA  Patient location: Pre-op. Preanesthetic checklist: patient identified, IV checked, site marked, risks and benefits discussed, surgical consent, monitors and equipment checked, pre-op evaluation and timeout performed Lidocaine 1% used for infiltration Left, radial was placed Catheter size: 20 G Hand hygiene performed , maximum sterile barriers used  and Seldinger technique used Allen's test indicative of satisfactory collateral circulation Attempts: 2 Procedure performed without using ultrasound guided technique. Following insertion, Biopatch. Post procedure assessment: normal  Patient tolerated the procedure well with no immediate complications.

## 2020-09-04 NOTE — H&P (Signed)
Kyle Bullock is a 69 y.o. male who presents today for routine electrophysiology study and ablation for atrial flutter.  Since last being seen in our clinic, the patient reports doing very well. He did have atrial flutter with RVR this past week for which he presented to the ED and was cardioverted. Today, he denies symptoms of palpitations, chest pain, shortness of breath,  lower extremity edema, dizziness, presyncope, syncope, or ICD shocks.  The patient is otherwise without complaint today.       Past Medical History:  Diagnosis Date  . Abnormal TSH    a. 11/2014: felt d/t sick euthyroid.  Marland Kitchen AICD (automatic cardioverter/defibrillator) present 02/28/2015  . CAD (coronary artery disease)    a. 11/2014: arrest/STEMI s/p Xience DES to the LAD and PTCA to the D1 [place], [place]). b. Relook cath 11/2014: no culprit, patent stent.  . CHF (congestive heart failure) (HCC)   . Dressler's syndrome (HCC)    a. 11/2014: tx with colchicine and prednisone.  . Dyslipidemia   . Dyspnea    a. 11/2014: possibly due to combo of CHF and Brilinta. Brilinta changed to Effient.  . Erectile dysfunction    a. Pt aware not to take ED med within 24 hr of NTG and vice versa.  . Hypertension   . Ischemic cardiomyopathy    a. 11/2014: EF reportedly 20% by cath and echo in [place].  Marland Kitchen NSVT (nonsustained ventricular tachycardia) (HCC) 11/16/14   a. 11/2014: admitted after discharge from STEMI admission, 18 beats 135-140bpm in ED.  Marland Kitchen Pericardial effusion    a. 11/2014.  Marland Kitchen Pericarditis dx'd 11/2014  . Pneumonia 1990's X 2  . RBBB   . Sleep apnea    suspected but not diagnosed (02/28/2015)  . STEMI (ST elevation myocardial infarction) (HCC) 11/10/2014  . Tobacco abuse   . VF (ventricular fibrillation) (HCC) 11/10/14   a. 11/2014: arrest with STEMI.        Past Surgical History:  Procedure Laterality Date  . CARDIAC CATHETERIZATION  11/19/2014  . CARDIAC DEFIBRILLATOR PLACEMENT   02/28/2015  . CORONARY ANGIOPLASTY WITH STENT PLACEMENT  11/10/2014   LAD DES  . HAND SURGERY Bilateral    Dupuytren's Contracture  . IMPLANTABLE CARDIOVERTER DEFIBRILLATOR IMPLANT N/A 02/28/2015   SJM Fortify Assura VR ICD implanted by Dr Johney Frame.  Revascularized following VF arrest, EF remained depressed and ICD implanted  . KNEE ARTHROSCOPY Right 1982  . LEFT HEART CATH N/A 11/19/2014   Procedure: LEFT HEART CATH;  Surgeon: Runell Gess, MD;  Location: [facility] CATH LAB;  Service: Cardiovascular;  Laterality: N/A;    ROS- all systems are reviewed and negative except as per HPI above        Current Outpatient Medications  Medication Sig Dispense Refill  . ANORO ELLIPTA 62.5-25 MCG/INH AEPB Inhale 1 puff into the lungs daily.  11  . apixaban (ELIQUIS) 5 MG TABS tablet Take 1 tablet (5 mg total) by mouth 2 (two) times daily. 60 tablet 6  . atorvastatin (LIPITOR) 20 MG tablet TAKE 1 TABLET BY MOUTH DAILY AT 6 PM 90 tablet 0  . carvedilol (COREG) 3.125 MG tablet Take 3.125 mg by mouth 2 (two) times daily.    . colchicine 0.6 MG tablet Take 0.6 mg by mouth daily as needed.    . empagliflozin (JARDIANCE) 10 MG TABS tablet Take 10 mg by mouth daily before breakfast. 30 tablet 11  . furosemide (LASIX) 20 MG tablet Take 1 tablet (20 mg total) by mouth  daily. 90 tablet 3  . levothyroxine (SYNTHROID) 75 MCG tablet Take 75 mcg by mouth daily before breakfast.    . loratadine (CLARITIN) 10 MG tablet Take 10 mg by mouth daily.    . nitroGLYCERIN (NITROSTAT) 0.4 MG SL tablet Place 1 tablet (0.4 mg total) under the tongue every 5 (five) minutes as needed for chest pain (MAX 3 TABLETS). Reported on 12/13/2015 10 tablet 3  . sacubitril-valsartan (ENTRESTO) 24-26 MG Take 1 tablet by mouth 2 (two) times daily. 180 tablet 1  . spironolactone (ALDACTONE) 25 MG tablet Take 1 tablet (25 mg total) by mouth daily. Please schedule an appointment 90 tablet 1  . VASCEPA 1 g capsule Take 2 g by mouth  daily.      No current facility-administered medications for this visit.    Physical Exam: Vitals:   09/04/20 0539  BP: (!) 100/45  Pulse: 68  Temp: 97.6 F (36.4 C)  SpO2: 96%  GEN- The patient is well appearing, alert and oriented x 3 today.   Head- normocephalic, atraumatic Eyes-  Sclera clear, conjunctiva pink Ears- hearing intact Oropharynx- clear Lungs-  normal work of breathing Chest- ICD pocket is well healed Heart- Regular rate and rhythm  GI- soft  Extremities- no clubbing, cyanosis, or edema   ekg 09/02/20- atrial flutter with 2:1 conduction, RBBB     Wt Readings from Last 3 Encounters:  07/23/20 207 lb (93.9 kg)  06/12/20 207 lb 12.8 oz (94.3 kg)  06/04/20 209 lb (94.8 kg)   Echo 05/16/20 is reviewed (EF 20%), moderate LA enlargement ekg 06/01/20 reveals typical atrial flutter with 2:1 AV conduction  Assessment and Plan:  1.  Chronic systolic dysfunction/ CAD/ ischemic CM euvolemic today No CHF symptoms Stable on an appropriate medical regimen  2. Typical atrial flutter Recent diagnosis On eliquis for chads2vasc score of 3.  He reports compliance without interruption.  Therapeutic strategies for atrial flutter including medicine and ablation were discussed in detail with the patient today. Risk, benefits, and alternatives to EP study and radiofrequency ablation were also discussed in detail today. These risks include but are not limited to stroke, bleeding, vascular damage, tamponade, perforation, damage to the heart and other structures, AV block requiring pacemaker, worsening renal function, ICD lead dislodgement, and death. The patient understands these risk and wishes to proceed.    Hillis Range MD, [facility] [place] 09/04/2020 7:29 AM

## 2020-09-04 NOTE — Anesthesia Procedure Notes (Signed)
Procedure Name: Intubation Date/Time: 09/04/2020 8:00 AM Performed by: Kyung Rudd, CRNA Pre-anesthesia Checklist: Patient identified, Emergency Drugs available, Suction available and Patient being monitored Patient Re-evaluated:Patient Re-evaluated prior to induction Oxygen Delivery Method: Circle system utilized Preoxygenation: Pre-oxygenation with 100% oxygen Induction Type: IV induction Ventilation: Mask ventilation without difficulty Laryngoscope Size: Mac and 4 Grade View: Grade II Tube type: Oral Tube size: 7.5 mm Number of attempts: 1 Airway Equipment and Method: Stylet Placement Confirmation: ETT inserted through vocal cords under direct vision,  positive ETCO2 and breath sounds checked- equal and bilateral Secured at: 22 cm Tube secured with: Tape Dental Injury: Teeth and Oropharynx as per pre-operative assessment

## 2020-09-04 NOTE — Discharge Instructions (Signed)
Post procedure care instructions No driving for 4 days. No lifting over 5 lbs for 1 week. No vigorous or sexual activity for 1 week. You may return to work/your usual activities on 10/5/101`. Keep procedure site clean & dry. If you notice increased pain, swelling, bleeding or pus, call/return!  You may shower, but no soaking baths/hot tubs/pools for 1 week.      Femoral Site Care This sheet gives you information about how to care for yourself after your procedure. Your health care provider may also give you more specific instructions. If you have problems or questions, contact your health care provider. What can I expect after the procedure? After the procedure, it is common to have:  Bruising that usually fades within 1-2 weeks.  Tenderness at the site. Follow these instructions at home: Wound care  Follow instructions from your health care provider about how to take care of your insertion site. Make sure you: ? Wash your hands with soap and water before you change your bandage (dressing). If soap and water are not available, use hand sanitizer. ? Change your dressing as told by your health care provider. ? Leave stitches (sutures), skin glue, or adhesive strips in place. These skin closures may need to stay in place for 2 weeks or longer. If adhesive strip edges start to loosen and curl up, you may trim the loose edges. Do not remove adhesive strips completely unless your health care provider tells you to do that.  Do not take baths, swim, or use a hot tub until your health care provider approves.  You may shower 24-48 hours after the procedure or as told by your health care provider. ? Gently wash the site with plain soap and water. ? Pat the area dry with a clean towel. ? Do not rub the site. This may cause bleeding.  Do not apply powder or lotion to the site. Keep the site clean and dry.  Check your femoral site every day for signs of infection. Check for: ? Redness, swelling, or  pain. ? Fluid or blood. ? Warmth. ? Pus or a bad smell. Activity  For the first 2-3 days after your procedure, or as long as directed: ? Avoid climbing stairs as much as possible. ? Do not squat.  Do not lift anything that is heavier than 10 lb (4.5 kg), or the limit that you are told, until your health care provider says that it is safe.  Rest as directed. ? Avoid sitting for a long time without moving. Get up to take short walks every 1-2 hours.  Do not drive for 24 hours if you were given a medicine to help you relax (sedative). General instructions  Take over-the-counter and prescription medicines only as told by your health care provider.  Keep all follow-up visits as told by your health care provider. This is important. Contact a health care provider if you have:  A fever or chills.  You have redness, swelling, or pain around your insertion site. Get help right away if:  The catheter insertion area swells very fast.  You pass out.  You suddenly start to sweat or your skin gets clammy.  The catheter insertion area is bleeding, and the bleeding does not stop when you hold steady pressure on the area.  The area near or just beyond the catheter insertion site becomes pale, cool, tingly, or numb. These symptoms may represent a serious problem that is an emergency. Do not wait to see if the symptoms  will go away. Get medical help right away. Call your local emergency services (911 in the [place] Do not drive yourself to the hospital. Summary  After the procedure, it is common to have bruising that usually fades within 1-2 weeks.  Check your femoral site every day for signs of infection.  Do not lift anything that is heavier than 10 lb (4.5 kg), or the limit that you are told, until your health care provider says that it is safe. This information is not intended to replace advice given to you by your health care provider. Make sure you discuss any questions you have with  your health care provider. Document Revised: 12/07/2017 Document Reviewed: 12/07/2017 Elsevier Patient Education  2020 [employer].

## 2020-09-04 NOTE — Anesthesia Preprocedure Evaluation (Signed)
Anesthesia Evaluation  Patient identified by MRN, date of birth, ID band Patient awake    Reviewed: Patient's Chart, lab work & pertinent test results  Airway Mallampati: II  TM Distance: >3 FB Neck ROM: Full    Dental  (+) Teeth Intact   Pulmonary sleep apnea , COPD, former smoker,    Pulmonary exam normal        Cardiovascular hypertension, Pt. on medications + CAD, + Past MI and +CHF  + dysrhythmias Atrial Fibrillation + Cardiac Defibrillator  Rhythm:Irregular Rate:Normal  STEMI 12/15 s/p Xience DES to the LAD and PTCA to the D1 [place], [place]). b. Relook cath 11/2014: no culprit, patent stent.   Neuro/Psych negative neurological ROS  negative psych ROS   GI/Hepatic negative GI ROS, Neg liver ROS,   Endo/Other  negative endocrine ROS  Renal/GU negative Renal ROS  negative genitourinary   Musculoskeletal   Abdominal (+)  Abdomen: soft. Bowel sounds: normal.  Peds  Hematology negative hematology ROS (+)   Anesthesia Other Findings   Reproductive/Obstetrics                             Anesthesia Physical Anesthesia Plan  ASA: III  Anesthesia Plan: General   Post-op Pain Management:    Induction: Intravenous  PONV Risk Score and Plan: 2 and Ondansetron and Dexamethasone  Airway Management Planned: Oral ETT and Mask  Additional Equipment: Arterial line  Intra-op Plan:   Post-operative Plan: Extubation in OR  Informed Consent: I have reviewed the patients History and Physical, chart, labs and discussed the procedure including the risks, benefits and alternatives for the proposed anesthesia with the patient or authorized representative who has indicated his/her understanding and acceptance.     Dental advisory given  Plan Discussed with: CRNA and Surgeon  Anesthesia Plan Comments: (Lab Results      Component                Value               Date                       WBC                      7.2                 09/02/2020                HGB                      13.9                09/02/2020                HCT                      42.2                09/02/2020                MCV                      93.0                09/02/2020                PLT  276                 09/02/2020           ECHO 06/21: IMPRESSIONS  1. Global hypokinesis with akinesis of all apical segments. Left  ventricular ejection fraction, by estimation, is 20 to 25%. The left  ventricle has severely decreased function. The left ventricle has no  regional wall motion abnormalities. The left  ventricular internal cavity size was moderately dilated. Left ventricular  diastolic parameters are consistent with Grade I diastolic dysfunction  (impaired relaxation).  2. Right ventricular systolic function is normal. The right ventricular  size is normal. There is normal pulmonary artery systolic pressure.  3. Left atrial size was moderately dilated.  4. The mitral valve is normal in structure. Trivial mitral valve  regurgitation. No evidence of mitral stenosis.  5. The aortic valve is tricuspid. Aortic valve regurgitation is not  visualized. No aortic stenosis is present.  6. The inferior vena cava is normal in size with greater than 50%  respiratory variability, suggesting right atrial pressure of 3 mmHg.  )        Anesthesia Quick Evaluation

## 2020-09-04 NOTE — Progress Notes (Signed)
Discharge instructions reviewed with patient and family. Verbalized understanding. 

## 2020-09-04 NOTE — Anesthesia Postprocedure Evaluation (Signed)
Anesthesia Post Note  Patient: Kyle Bullock  Procedure(s) Performed: A-FLUTTER ABLATION (N/A )     Patient location during evaluation: PACU Anesthesia Type: General Level of consciousness: awake and alert Pain management: pain level controlled Vital Signs Assessment: post-procedure vital signs reviewed and stable Respiratory status: spontaneous breathing, nonlabored ventilation, respiratory function stable and patient connected to nasal cannula oxygen Cardiovascular status: blood pressure returned to baseline and stable Postop Assessment: no apparent nausea or vomiting Anesthetic complications: no   No complications documented.  Last Vitals:  Vitals:   09/04/20 1100 09/04/20 1115  BP: 108/64 112/68  Pulse: 66 71  Resp: 14 (!) 21  Temp:    SpO2: 91% 90%    Last Pain:  Vitals:   09/04/20 1035  TempSrc:   PainSc: 0-No pain                 Earl Lites P Wyatt Thorstenson

## 2020-09-05 ENCOUNTER — Encounter (HOSPITAL_COMMUNITY): Payer: Self-pay | Admitting: Internal Medicine

## 2020-09-07 ENCOUNTER — Telehealth: Payer: Self-pay | Admitting: Internal Medicine

## 2020-09-07 NOTE — Telephone Encounter (Signed)
Called and spoke to patient's wife and answered all questions.

## 2020-09-07 NOTE — Progress Notes (Signed)
Remote ICD transmission.   

## 2020-09-07 NOTE — Telephone Encounter (Signed)
Patient's wife is calling regarding ablation completed on 09/04/20. She states she has questions regarding post-op instructions. Please call.

## 2020-09-08 DIAGNOSIS — Z23 Encounter for immunization: Secondary | ICD-10-CM | POA: Diagnosis not present

## 2020-09-15 ENCOUNTER — Other Ambulatory Visit (HOSPITAL_COMMUNITY): Payer: Self-pay | Admitting: Internal Medicine

## 2020-10-01 ENCOUNTER — Other Ambulatory Visit (HOSPITAL_COMMUNITY): Payer: Self-pay

## 2020-10-01 MED ORDER — EMPAGLIFLOZIN 10 MG PO TABS
10.0000 mg | ORAL_TABLET | Freq: Every day | ORAL | 11 refills | Status: DC
Start: 2020-10-01 — End: 2021-09-24

## 2020-10-18 ENCOUNTER — Ambulatory Visit: Payer: PPO | Admitting: Internal Medicine

## 2020-10-18 ENCOUNTER — Encounter: Payer: Self-pay | Admitting: Internal Medicine

## 2020-10-18 ENCOUNTER — Other Ambulatory Visit: Payer: Self-pay

## 2020-10-18 VITALS — BP 94/58 | HR 64 | Ht 75.0 in | Wt 210.8 lb

## 2020-10-18 DIAGNOSIS — I255 Ischemic cardiomyopathy: Secondary | ICD-10-CM | POA: Diagnosis not present

## 2020-10-18 DIAGNOSIS — I4901 Ventricular fibrillation: Secondary | ICD-10-CM | POA: Diagnosis not present

## 2020-10-18 DIAGNOSIS — I5022 Chronic systolic (congestive) heart failure: Secondary | ICD-10-CM | POA: Diagnosis not present

## 2020-10-18 DIAGNOSIS — I2510 Atherosclerotic heart disease of native coronary artery without angina pectoris: Secondary | ICD-10-CM | POA: Diagnosis not present

## 2020-10-18 MED ORDER — ASPIRIN EC 81 MG PO TBEC: 81.0000 mg | DELAYED_RELEASE_TABLET | Freq: Every day | ORAL | 3 refills | Status: DC

## 2020-10-18 NOTE — Patient Instructions (Addendum)
Medication Instructions:  Stop eliquis Start Aspirin 81 mg daily  *If you need a refill on your cardiac medications before your next appointment, please call your pharmacy*  Lab Work: None ordered.  If you have labs (blood work) drawn today and your tests are completely normal, you will receive your results only by: Marland Kitchen MyChart Message (if you have MyChart) OR . A paper copy in the mail If you have any lab test that is abnormal or we need to change your treatment, we will call you to review the results.  Testing/Procedures: None ordered.  Follow-Up: At [facility], you and your health needs are our priority.  As part of our continuing mission to provide you with exceptional heart care, we have created designated Provider Care Teams.  These Care Teams include your primary Cardiologist (physician) and Advanced Practice Providers (APPs -  Physician Assistants and Nurse Practitioners) who all work together to provide you with the care you need, when you need it.  We recommend signing up for the patient portal called "MyChart".  Sign up information is provided on this After Visit Summary.  MyChart is used to connect with patients for Virtual Visits (Telemedicine).  Patients are able to view lab/test results, encounter notes, upcoming appointments, etc.  Non-urgent messages can be sent to your provider as well.   To learn more about what you can do with MyChart, go to [email or web address].    Your next appointment:   Your physician wants you to follow-up in: 6 months with Kyle Bullock.  1 year with Kyle Bullock. You will receive a reminder letter in the mail two months in advance. If you don't receive a letter, please call our office to schedule the follow-up appointment.  Remote monitoring is used to monitor your ICD from home. This monitoring reduces the number of office visits required to check your device to one time per year. It allows [place] to keep an eye on the functioning of your device to  ensure it is working properly. You are scheduled for a device check from home on 12/04/20. You may send your transmission at any time that day. If you have a wireless device, the transmission will be sent automatically. After your physician reviews your transmission, you will receive a postcard with your next transmission date.  Other Instructions:

## 2020-10-18 NOTE — Progress Notes (Signed)
PCP: Martha Clan, MD Primary Cardiologist: Dr Gala Romney Primary EP: Dr Johney Frame  Kyle Bullock is a 69 y.o. male who presents today for routine electrophysiology followup.  Since last being seen in our clinic, the patient reports doing very well.  Today, he denies symptoms of palpitations, chest pain, shortness of breath,  lower extremity edema, dizziness, presyncope, syncope, or ICD shocks.  The patient is otherwise without complaint today.   Past Medical History:  Diagnosis Date  . Abnormal TSH    a. 11/2014: felt d/t sick euthyroid.  Marland Kitchen AICD (automatic cardioverter/defibrillator) present 02/28/2015  . CAD (coronary artery disease)    a. 11/2014: arrest/STEMI s/p Xience DES to the LAD and PTCA to the D1 [place], [place]). b. Relook cath 11/2014: no culprit, patent stent.  . CHF (congestive heart failure) (HCC)   . Dressler's syndrome (HCC)    a. 11/2014: tx with colchicine and prednisone.  . Dyslipidemia   . Dyspnea    a. 11/2014: possibly due to combo of CHF and Brilinta. Brilinta changed to Effient.  . Erectile dysfunction    a. Pt aware not to take ED med within 24 hr of NTG and vice versa.  . Hypertension   . Ischemic cardiomyopathy    a. 11/2014: EF reportedly 20% by cath and echo in [place].  Marland Kitchen NSVT (nonsustained ventricular tachycardia) (HCC) 11/16/14   a. 11/2014: admitted after discharge from STEMI admission, 18 beats 135-140bpm in ED.  Marland Kitchen Pericardial effusion    a. 11/2014.  Marland Kitchen Pericarditis dx'd 11/2014  . Pneumonia 1990's X 2  . RBBB   . Sleep apnea    suspected but not diagnosed (02/28/2015)  . STEMI (ST elevation myocardial infarction) (HCC) 11/10/2014  . Tobacco abuse   . VF (ventricular fibrillation) (HCC) 11/10/14   a. 11/2014: arrest with STEMI.   Past Surgical History:  Procedure Laterality Date  . A-FLUTTER ABLATION N/A 09/04/2020   Procedure: A-FLUTTER ABLATION;  Surgeon: Hillis Range, MD;  Location: [facility] INVASIVE CV LAB;  Service: Cardiovascular;   Laterality: N/A;  . CARDIAC CATHETERIZATION  11/19/2014  . CARDIAC DEFIBRILLATOR PLACEMENT  02/28/2015  . CORONARY ANGIOPLASTY WITH STENT PLACEMENT  11/10/2014   LAD DES  . HAND SURGERY Bilateral    Dupuytren's Contracture  . IMPLANTABLE CARDIOVERTER DEFIBRILLATOR IMPLANT N/A 02/28/2015   SJM Fortify Assura VR ICD implanted by Dr Johney Frame.  Revascularized following VF arrest, EF remained depressed and ICD implanted  . KNEE ARTHROSCOPY Right 1982  . LEFT HEART CATH N/A 11/19/2014   Procedure: LEFT HEART CATH;  Surgeon: Runell Gess, MD;  Location: [facility] CATH LAB;  Service: Cardiovascular;  Laterality: N/A;    ROS- all systems are reviewed and negative except as per HPI above  Current Outpatient Medications  Medication Sig Dispense Refill  . acetaminophen (TYLENOL) 500 MG tablet Take 1,000 mg by mouth every 6 (six) hours as needed for moderate pain.    Ailene Ards ELLIPTA 62.5-25 MCG/INH AEPB Inhale 1 puff into the lungs daily.  11  . apixaban (ELIQUIS) 5 MG TABS tablet Take 5 mg by mouth 2 (two) times daily.    Marland Kitchen atorvastatin (LIPITOR) 20 MG tablet TAKE 1 TABLET BY MOUTH DAILY AT 6 PM 90 tablet 1  . carvedilol (COREG) 6.25 MG tablet Take 3.125 mg by mouth 2 (two) times daily with a meal.    . colchicine 0.6 MG tablet Take 0.6 mg by mouth daily as needed (gout flare).     . empagliflozin (JARDIANCE) 10 MG  TABS tablet Take 1 tablet (10 mg total) by mouth daily before breakfast. 30 tablet 11  . ENTRESTO 24-26 MG TAKE 1 TABLET BY MOUTH TWICE DAILY 180 tablet 1  . furosemide (LASIX) 20 MG tablet Take 1 tablet (20 mg total) by mouth daily. 90 tablet 3  . levothyroxine (SYNTHROID) 75 MCG tablet Take 75 mcg by mouth daily before breakfast.    . nitroGLYCERIN (NITROSTAT) 0.4 MG SL tablet Place 1 tablet (0.4 mg total) under the tongue every 5 (five) minutes as needed for chest pain (MAX 3 TABLETS). Reported on 12/13/2015 10 tablet 3  . spironolactone (ALDACTONE) 25 MG tablet Take 1 tablet (25 mg total) by  mouth daily. Please schedule an appointment 90 tablet 1  . VASCEPA 1 g capsule Take 2 g by mouth 2 (two) times daily.      No current facility-administered medications for this visit.    Physical Exam: Vitals:   10/18/20 1430  BP: (!) 94/58  Pulse: 64  SpO2: 97%  Weight: 210 lb 12.8 oz (95.6 kg)  Height: 6\' 3"  (1.905 m)    GEN- The patient is well appearing, alert and oriented x 3 today.   Head- normocephalic, atraumatic Eyes-  Sclera clear, conjunctiva pink Ears- hearing intact Oropharynx- clear Lungs- Clear to ausculation bilaterally, normal work of breathing Chest- ICD pocket is well healed Heart- Regular rate and rhythm, no murmurs, rubs or gallops, PMI not laterally displaced GI- soft, NT, ND, + BS Extremities- no clubbing, cyanosis, or edema  ICD interrogation- reviewed in detail today,  See PACEART report  ekg tracing ordered today is personally reviewed and shows sinus with RBBB, QRS 128 msec  Wt Readings from Last 3 Encounters:  10/18/20 210 lb 12.8 oz (95.6 kg)  09/04/20 205 lb (93 kg)  09/02/20 205 lb (93 kg)    Assessment and Plan:  1.  Chronic systolic dysfunction/ ischemic CM/ CAD/ prior VF arrrest euvolemic today Stable on an appropriate medical regimen Normal ICD function See [facility] report No changes today he is not device dependant today Stop eliquis and restart ASA  2. Atrial flutter Doing well s/p CTI ablation by me 09/04/20 Denies procedure related complications Stop eliquis today   3. HL Continue lipitor  Risks, benefits and potential toxicities for medications prescribed and/or refilled reviewed with patient today.   Return to see EP PA in 6 months I will see in a year  09/06/20 MD, [facility] 10/18/2020 2:33 PM

## 2020-10-22 ENCOUNTER — Other Ambulatory Visit (HOSPITAL_COMMUNITY): Payer: Self-pay | Admitting: Internal Medicine

## 2020-10-22 DIAGNOSIS — I4510 Unspecified right bundle-branch block: Secondary | ICD-10-CM

## 2020-10-22 DIAGNOSIS — I255 Ischemic cardiomyopathy: Secondary | ICD-10-CM

## 2020-10-22 DIAGNOSIS — I10 Essential (primary) hypertension: Secondary | ICD-10-CM

## 2020-10-22 DIAGNOSIS — I5022 Chronic systolic (congestive) heart failure: Secondary | ICD-10-CM

## 2020-11-27 ENCOUNTER — Other Ambulatory Visit (HOSPITAL_COMMUNITY): Payer: Self-pay

## 2020-11-27 MED ORDER — NITROGLYCERIN 0.4 MG SL SUBL: 0.4000 mg | SUBLINGUAL_TABLET | SUBLINGUAL | 3 refills | Status: DC | PRN

## 2020-12-04 ENCOUNTER — Ambulatory Visit (INDEPENDENT_AMBULATORY_CARE_PROVIDER_SITE_OTHER): Payer: PPO

## 2020-12-04 DIAGNOSIS — I4901 Ventricular fibrillation: Secondary | ICD-10-CM

## 2020-12-04 DIAGNOSIS — I255 Ischemic cardiomyopathy: Secondary | ICD-10-CM

## 2020-12-04 LAB — CUP PACEART REMOTE DEVICE CHECK
Battery Remaining Longevity: 55 mo
Battery Remaining Percentage: 52 %
Battery Voltage: 3.01 V
Brady Statistic RV Percent Paced: 1 %
Date Time Interrogation Session: 20211228020021
HighPow Impedance: 62 Ohm
HighPow Impedance: 62 Ohm
Implantable Lead Implant Date: 20160323
Implantable Lead Location: 753860
Implantable Pulse Generator Implant Date: 20160323
Lead Channel Impedance Value: 350 Ohm
Lead Channel Pacing Threshold Amplitude: 0.5 V
Lead Channel Pacing Threshold Pulse Width: 0.5 ms
Lead Channel Sensing Intrinsic Amplitude: 12 mV
Lead Channel Setting Pacing Amplitude: 2.5 V
Lead Channel Setting Pacing Pulse Width: 0.5 ms
Lead Channel Setting Sensing Sensitivity: 0.5 mV
Pulse Gen Serial Number: 7255600

## 2020-12-18 NOTE — Progress Notes (Signed)
Remote ICD transmission.   

## 2020-12-27 DIAGNOSIS — E785 Hyperlipidemia, unspecified: Secondary | ICD-10-CM | POA: Diagnosis not present

## 2020-12-27 DIAGNOSIS — E039 Hypothyroidism, unspecified: Secondary | ICD-10-CM | POA: Diagnosis not present

## 2020-12-27 DIAGNOSIS — M109 Gout, unspecified: Secondary | ICD-10-CM | POA: Diagnosis not present

## 2020-12-27 DIAGNOSIS — Z125 Encounter for screening for malignant neoplasm of prostate: Secondary | ICD-10-CM | POA: Diagnosis not present

## 2020-12-27 DIAGNOSIS — R7301 Impaired fasting glucose: Secondary | ICD-10-CM | POA: Diagnosis not present

## 2020-12-30 NOTE — Progress Notes (Signed)
Patient ID: Kyle Bullock, male   DOB: [birth year], 70 y.o.   MRN: [id number]    Advanced Heart Failure Clinic Note    History of Present Illness: Kyle Bullock is a 70 y.o. male who is referred to the HF Clinic by Dr. Excell Seltzer. He has h/o CAD and ischemic cardiomyopathy 20-25%.  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    Had anterolateral MI complicated by VF arrest in 2015 in [place]. He underwent DES to the LAD and PTCA of the diagonal. EF 25%. Returned to [facility], developed dyspnea. Brilinta was switched to Effient as it was felt the Brilinta may have been playing a role in his dyspnea. In 12/15 had pleuritic CP. Cath showed patency of stent and area of balloon angioplasty in the diagonal. EF was 20-25% with diffuse HK and normal pericardial appearance. He was diagnosed with Dressler's syndrome and placed on prednisone and colchicine.  CT with COPD. Referred to Dr. Kendrick Fries who felt he had mild COPD but no ILD. Given PRN albuterol.   Underwent successful AFL ablation with Dr. Johney Frame 09/04/20. Amio and Eliquis stopped.  Today he returns for HF follow up.Overall feeling fine. Having occasional dizziness when standing. Denies SOB/PND/Orthopnea. Walking 5 days a week. Appetite ok. No fever or chills. Weight at home has been stable 204-205 pounds. Taking all medications..   ICD interrogated personally.- impedance up. Activity ~4 hour. VS 99%   Echo 3/16 EF 25-30% Echo 03/2016 EF 20-25% Echo 9/18 EF 20-25% RV normal Echo 06/05/20 EF 25%  CPX 2/18 FVC 3.49 (72%)    FEV1 2.49 (68%)     FEV1/FVC 71 (93%)     MVV 117 (82%) Resting HR: 62 Peak HR: 148  (97% age predicted max HR) BP rest: 84/60 BP peak: 156/62 Peak VO2: 19.6 (69% predicted peak VO2) VE/VCO2 slope: 37 OUES: 2.14 Peak RER: 1.06 Ventilatory Threshold: 17.4 (61% predicted or measured peak VO2) VE/MVV: 66% O2pulse: 12  (71% predicted O2pulse)  CPX 05/14/2015  Peak VO2: 19.0 (64.5% predicted peak  VO2) VE/VCO2 slope: 41.5 OUES: 2.01 Peak RER: 1.06 Ventilatory Threshold: 14.1     (47.9% predicted or measured peak VO2)   Past Medical History:  Diagnosis Date  . Abnormal TSH    a. 11/2014: felt d/t sick euthyroid.  Marland Kitchen AICD (automatic cardioverter/defibrillator) present 02/28/2015  . CAD (coronary artery disease)    a. 11/2014: arrest/STEMI s/p Xience DES to the LAD and PTCA to the D1 [place], [place]). b. Relook cath 11/2014: no culprit, patent stent.  . CHF (congestive heart failure) (HCC)   . Dressler's syndrome (HCC)    a. 11/2014: tx with colchicine and prednisone.  . Dyslipidemia   . Dyspnea    a. 11/2014: possibly due to combo of CHF and Brilinta. Brilinta changed to Effient.  . Erectile dysfunction    a. Pt aware not to take ED med within 24 hr of NTG and vice versa.  . Hypertension   . Ischemic cardiomyopathy    a. 11/2014: EF reportedly 20% by cath and echo in [place].  Marland Kitchen NSVT (nonsustained ventricular tachycardia) (HCC) 11/16/14   a. 11/2014: admitted after discharge from STEMI admission, 18 beats 135-140bpm in ED.  Marland Kitchen Pericardial effusion    a. 11/2014.  Marland Kitchen Pericarditis dx'd 11/2014  . Pneumonia 1990's X 2  . RBBB   . Sleep apnea    suspected but not diagnosed (02/28/2015)  . STEMI (ST elevation myocardial infarction) (HCC) 11/10/2014  . Tobacco abuse   .  VF (ventricular fibrillation) (HCC) 11/10/14   a. 11/2014: arrest with STEMI.    Past Surgical History:  Procedure Laterality Date  . A-FLUTTER ABLATION N/A 09/04/2020   Procedure: A-FLUTTER ABLATION;  Surgeon: Hillis Range, MD;  Location: [facility] INVASIVE CV LAB;  Service: Cardiovascular;  Laterality: N/A;  . CARDIAC CATHETERIZATION  11/19/2014  . CARDIAC DEFIBRILLATOR PLACEMENT  02/28/2015  . CORONARY ANGIOPLASTY WITH STENT PLACEMENT  11/10/2014   LAD DES  . HAND SURGERY Bilateral    Dupuytren's Contracture  . IMPLANTABLE CARDIOVERTER DEFIBRILLATOR IMPLANT N/A 02/28/2015   SJM Fortify Assura VR ICD  implanted by Dr Johney Frame.  Revascularized following VF arrest, EF remained depressed and ICD implanted  . KNEE ARTHROSCOPY Right 1982  . LEFT HEART CATH N/A 11/19/2014   Procedure: LEFT HEART CATH;  Surgeon: Runell Gess, MD;  Location: [facility] CATH LAB;  Service: Cardiovascular;  Laterality: N/A;    Current Outpatient Medications  Medication Sig Dispense Refill  . acetaminophen (TYLENOL) 500 MG tablet Take 1,000 mg by mouth every 6 (six) hours as needed for moderate pain.    Ailene Ards ELLIPTA 62.5-25 MCG/INH AEPB Inhale 1 puff into the lungs daily.  11  . aspirin EC 81 MG tablet Take 1 tablet (81 mg total) by mouth daily. Swallow whole. 90 tablet 3  . atorvastatin (LIPITOR) 20 MG tablet TAKE 1 TABLET BY MOUTH DAILY AT 6 PM 90 tablet 1  . carvedilol (COREG) 6.25 MG tablet Take 3.125 mg by mouth 2 (two) times daily with a meal.    . colchicine 0.6 MG tablet Take 0.6 mg by mouth daily as needed (gout flare).     . empagliflozin (JARDIANCE) 10 MG TABS tablet Take 1 tablet (10 mg total) by mouth daily before breakfast. 30 tablet 11  . ENTRESTO 24-26 MG TAKE 1 TABLET BY MOUTH TWICE DAILY 180 tablet 1  . furosemide (LASIX) 20 MG tablet Take 1 tablet (20 mg total) by mouth daily. 90 tablet 3  . levothyroxine (SYNTHROID) 75 MCG tablet Take 75 mcg by mouth daily before breakfast.    . loratadine (CLARITIN) 10 MG tablet Take 10 mg by mouth daily.    . nitroGLYCERIN (NITROSTAT) 0.4 MG SL tablet Place 1 tablet (0.4 mg total) under the tongue every 5 (five) minutes as needed for chest pain (MAX 3 TABLETS). 10 tablet 3  . spironolactone (ALDACTONE) 25 MG tablet TAKE 1 TABLET BY MOUTH DAILY 90 tablet 1  . VASCEPA 1 g capsule Take 2 g by mouth 2 (two) times daily.      No current facility-administered medications for this encounter.    Allergies:   Ambien [zolpidem tartrate], Lisinopril, and Brilinta [ticagrelor]   Social History:  The patient  reports that he quit smoking about 6 years ago. His smoking use  included cigarettes. He has a 80.00 pack-year smoking history. He has never used smokeless tobacco. He reports current alcohol use of about 14.0 standard drinks of alcohol per week. He reports that he does not use drugs.   Family History:  The patient's  family history includes COPD in his mother; Hypertension in his father and mother.   Review of systems complete and found to be negative unless listed in HPI.     Vitals:   12/31/20 0945  BP: 104/60  Pulse: 62  SpO2: 95%  Weight: 96.1 kg (211 lb 12.8 oz)   Sitting SBP 104.  Standing SBP 94  Wt Readings from Last 3 Encounters:  12/31/20 96.1 kg (211  lb 12.8 oz)  10/18/20 95.6 kg (210 lb 12.8 oz)  09/04/20 93 kg (205 lb)    Physical exam: General:  Well appearing. No resp difficulty HEENT: normal Neck: supple. no JVD. Carotids 2+ bilat; no bruits. No lymphadenopathy or thryomegaly appreciated. Cor: PMI nondisplaced. Regular rate & rhythm. No rubs, gallops or murmurs. Lungs: clear Abdomen: soft, nontender, nondistended. No hepatosplenomegaly. No bruits or masses. Good bowel sounds. Extremities: no cyanosis, clubbing, rash, edema Neuro: alert & orientedx3, cranial nerves grossly intact. moves all 4 extremities w/o difficulty. Affect pleasant  Recent Labs: 09/02/2020: BUN 20; Creatinine, Ser 1.31; Hemoglobin 13.9; Magnesium 2.0; Platelets 276; Potassium 3.5; Sodium 137   Lipid Panel     Component Value Date/Time   CHOL 108 01/08/2015 0850   CHOL 101 12/04/2014 1030   TRIG 155.0 (H) 01/08/2015 0850   HDL 39.00 (L) 01/08/2015 0850   HDL 43 12/04/2014 1030   CHOLHDL 3 01/08/2015 0850   VLDL 31.0 01/08/2015 0850   LDLCALC 38 01/08/2015 0850   LDLCALC 39 12/04/2014 1030     ASSESSMENT AND PLAN:  1. Chronic systolic due to ischemic CM  - Echo 03/2016 LVEF 20-25%, Grade 2 DD - Echo 3/18 shows with stable LVEF 20-25%. Normal RV.  - Echo 9/18 EF 20-25%, grade 2 DD  - Echo  05/16/20  EF 25% RV normal.  - NYHA II. Volume status  low. Will change lasix to as needed for weight 207 or greater. .   - Continue Entresto 24/26.  Up-titration Limited by hypotension.  - Continue carvedilol 6.25 mg BID.  - Continue sprio 25 mg daily.   - Continue Farxiga 10 - ICD interrogated personally and reviewed.  2.  CAD s/p Anterior MI December 2015:  - No chest pain.  - Continue ASA, statin and Effient  - Lipids per Dr. Clelia Croft.  3. Atrial flutter - s/p AFL ablation 9/21 with Dr. Johney Frame  - No recurrence.  4. Tobacco abuse:  - Remains quit 5. Emphysema - Sees pulmonary. Spirometry on CPX previously reviewed with only mild disease.     Letter provided so that he may receive a massage per patient request.  Follow up in 6 months. Check BMET.   Tonye Becket, NP  9:51 AM

## 2020-12-31 ENCOUNTER — Other Ambulatory Visit: Payer: Self-pay

## 2020-12-31 ENCOUNTER — Encounter (HOSPITAL_COMMUNITY): Payer: Self-pay | Admitting: Internal Medicine

## 2020-12-31 ENCOUNTER — Ambulatory Visit (HOSPITAL_COMMUNITY)
Admission: RE | Admit: 2020-12-31 | Discharge: 2020-12-31 | Disposition: A | Discharge status: Home / Self Care | Payer: PPO | Source: Ambulatory Visit | Attending: Internal Medicine | Admitting: Internal Medicine

## 2020-12-31 VITALS — BP 104/60 | HR 62 | Wt 211.8 lb

## 2020-12-31 DIAGNOSIS — Z888 Allergy status to other drugs, medicaments and biological substances status: Secondary | ICD-10-CM | POA: Insufficient documentation

## 2020-12-31 DIAGNOSIS — I110 Hypertensive heart disease with heart failure: Secondary | ICD-10-CM | POA: Insufficient documentation

## 2020-12-31 DIAGNOSIS — Z955 Presence of coronary angioplasty implant and graft: Secondary | ICD-10-CM | POA: Insufficient documentation

## 2020-12-31 DIAGNOSIS — I2510 Atherosclerotic heart disease of native coronary artery without angina pectoris: Secondary | ICD-10-CM | POA: Diagnosis not present

## 2020-12-31 DIAGNOSIS — Z87891 Personal history of nicotine dependence: Secondary | ICD-10-CM | POA: Diagnosis not present

## 2020-12-31 DIAGNOSIS — I519 Heart disease, unspecified: Secondary | ICD-10-CM

## 2020-12-31 DIAGNOSIS — I5022 Chronic systolic (congestive) heart failure: Secondary | ICD-10-CM | POA: Insufficient documentation

## 2020-12-31 DIAGNOSIS — Z95810 Presence of automatic (implantable) cardiac defibrillator: Secondary | ICD-10-CM | POA: Diagnosis not present

## 2020-12-31 DIAGNOSIS — Z8249 Family history of ischemic heart disease and other diseases of the circulatory system: Secondary | ICD-10-CM | POA: Diagnosis not present

## 2020-12-31 DIAGNOSIS — I4892 Unspecified atrial flutter: Secondary | ICD-10-CM | POA: Insufficient documentation

## 2020-12-31 DIAGNOSIS — I252 Old myocardial infarction: Secondary | ICD-10-CM | POA: Insufficient documentation

## 2020-12-31 DIAGNOSIS — Z825 Family history of asthma and other chronic lower respiratory diseases: Secondary | ICD-10-CM | POA: Insufficient documentation

## 2020-12-31 DIAGNOSIS — I255 Ischemic cardiomyopathy: Secondary | ICD-10-CM | POA: Insufficient documentation

## 2020-12-31 DIAGNOSIS — I951 Orthostatic hypotension: Secondary | ICD-10-CM

## 2020-12-31 DIAGNOSIS — Z79899 Other long term (current) drug therapy: Secondary | ICD-10-CM | POA: Insufficient documentation

## 2020-12-31 DIAGNOSIS — Z7982 Long term (current) use of aspirin: Secondary | ICD-10-CM | POA: Insufficient documentation

## 2020-12-31 DIAGNOSIS — J439 Emphysema, unspecified: Secondary | ICD-10-CM | POA: Insufficient documentation

## 2020-12-31 LAB — BASIC METABOLIC PANEL
Anion gap: 12 (ref 5–15)
BUN: 17 mg/dL (ref 8–23)
CO2: 24 mmol/L (ref 22–32)
Calcium: 9.4 mg/dL (ref 8.9–10.3)
Chloride: 104 mmol/L (ref 98–111)
Creatinine, Ser: 1.26 mg/dL — ABNORMAL HIGH (ref 0.61–1.24)
GFR, Estimated: >60 mL/min (ref >60)
Glucose, Bld: 112 mg/dL — ABNORMAL HIGH (ref 70–99)
Potassium: 4.9 mmol/L (ref 3.5–5.1)
Sodium: 140 mmol/L (ref 135–145)

## 2020-12-31 MED ORDER — FUROSEMIDE 20 MG PO TABS
20.0000 mg | ORAL_TABLET | ORAL | 3 refills | Status: DC | PRN
Start: 2020-12-31 — End: 2021-03-01

## 2020-12-31 MED ORDER — FUROSEMIDE 20 MG PO TABS
20.0000 mg | ORAL_TABLET | ORAL | 3 refills | Status: DC | PRN
Start: 2020-12-31 — End: 2020-12-31

## 2020-12-31 NOTE — Patient Instructions (Signed)
Take Lasix 20 mg  As needed for weight of 207-208 lbs  Labs done today, your results will be available in MyChart, we will contact you for abnormal readings.   Please call our office in June 2022 for an appointment   If you have any questions or concerns before your next appointment please send [place] a message through [place] or call our office at [phone].    TO LEAVE A MESSAGE FOR THE NURSE SELECT OPTION 2, PLEASE LEAVE A MESSAGE INCLUDING: . YOUR NAME . DATE OF BIRTH . CALL BACK NUMBER . REASON FOR CALL**this is important as we prioritize the call backs  YOU WILL RECEIVE A CALL BACK THE SAME DAY AS LONG AS YOU CALL BEFORE 4:00 PM  At the Advanced Heart Failure Clinic, you and your health needs are our priority. As part of our continuing mission to provide you with exceptional heart care, we have created designated Provider Care Teams. These Care Teams include your primary Cardiologist (physician) and Advanced Practice Providers (APPs- Physician Assistants and Nurse Practitioners) who all work together to provide you with the care you need, when you need it.   You may see any of the following providers on your designated Care Team at your next follow up: Marland Kitchen Dr Arvilla Meres . Dr Marca Ancona . Tonye Becket, NP . Robbie Lis, PA . Karle Plumber, PharmD   Please be sure to bring in all your medications bottles to every appointment.

## 2021-01-04 ENCOUNTER — Other Ambulatory Visit: Payer: Self-pay | Admitting: Internal Medicine

## 2021-01-04 DIAGNOSIS — N1831 Chronic kidney disease, stage 3a: Secondary | ICD-10-CM | POA: Diagnosis not present

## 2021-01-04 DIAGNOSIS — D692 Other nonthrombocytopenic purpura: Secondary | ICD-10-CM | POA: Diagnosis not present

## 2021-01-04 DIAGNOSIS — F17201 Nicotine dependence, unspecified, in remission: Secondary | ICD-10-CM

## 2021-01-04 DIAGNOSIS — J432 Centrilobular emphysema: Secondary | ICD-10-CM | POA: Diagnosis not present

## 2021-01-04 DIAGNOSIS — Z0000 Encounter for general adult medical examination without abnormal findings: Secondary | ICD-10-CM | POA: Diagnosis not present

## 2021-01-04 DIAGNOSIS — Z1339 Encounter for screening examination for other mental health and behavioral disorders: Secondary | ICD-10-CM | POA: Diagnosis not present

## 2021-01-04 DIAGNOSIS — I5022 Chronic systolic (congestive) heart failure: Secondary | ICD-10-CM | POA: Diagnosis not present

## 2021-01-04 DIAGNOSIS — E785 Hyperlipidemia, unspecified: Secondary | ICD-10-CM | POA: Diagnosis not present

## 2021-01-04 DIAGNOSIS — I483 Typical atrial flutter: Secondary | ICD-10-CM | POA: Diagnosis not present

## 2021-01-04 DIAGNOSIS — I130 Hypertensive heart and chronic kidney disease with heart failure and stage 1 through stage 4 chronic kidney disease, or unspecified chronic kidney disease: Secondary | ICD-10-CM | POA: Diagnosis not present

## 2021-01-04 DIAGNOSIS — Z9861 Coronary angioplasty status: Secondary | ICD-10-CM | POA: Diagnosis not present

## 2021-01-04 DIAGNOSIS — R7301 Impaired fasting glucose: Secondary | ICD-10-CM | POA: Diagnosis not present

## 2021-01-04 DIAGNOSIS — I700 Atherosclerosis of aorta: Secondary | ICD-10-CM | POA: Diagnosis not present

## 2021-01-04 DIAGNOSIS — Z1331 Encounter for screening for depression: Secondary | ICD-10-CM | POA: Diagnosis not present

## 2021-01-04 DIAGNOSIS — R82998 Other abnormal findings in urine: Secondary | ICD-10-CM | POA: Diagnosis not present

## 2021-01-04 DIAGNOSIS — E039 Hypothyroidism, unspecified: Secondary | ICD-10-CM | POA: Diagnosis not present

## 2021-01-08 DIAGNOSIS — Z1212 Encounter for screening for malignant neoplasm of rectum: Secondary | ICD-10-CM | POA: Diagnosis not present

## 2021-03-01 ENCOUNTER — Other Ambulatory Visit (HOSPITAL_COMMUNITY): Payer: Self-pay | Admitting: Internal Medicine

## 2021-03-01 ENCOUNTER — Other Ambulatory Visit: Payer: Self-pay

## 2021-03-05 ENCOUNTER — Ambulatory Visit (INDEPENDENT_AMBULATORY_CARE_PROVIDER_SITE_OTHER): Payer: PPO

## 2021-03-05 DIAGNOSIS — I255 Ischemic cardiomyopathy: Secondary | ICD-10-CM | POA: Diagnosis not present

## 2021-03-05 LAB — CUP PACEART REMOTE DEVICE CHECK
Battery Remaining Longevity: 53 mo
Battery Remaining Percentage: 50 %
Battery Voltage: 2.99 V
Brady Statistic RV Percent Paced: 1 %
Date Time Interrogation Session: 20220329020026
HighPow Impedance: 66 Ohm
HighPow Impedance: 66 Ohm
Implantable Lead Implant Date: 20160323
Implantable Lead Location: 753860
Implantable Pulse Generator Implant Date: 20160323
Lead Channel Impedance Value: 350 Ohm
Lead Channel Pacing Threshold Amplitude: 0.5 V
Lead Channel Pacing Threshold Pulse Width: 0.5 ms
Lead Channel Sensing Intrinsic Amplitude: 10.4 mV
Lead Channel Setting Pacing Amplitude: 2.5 V
Lead Channel Setting Pacing Pulse Width: 0.5 ms
Lead Channel Setting Sensing Sensitivity: 0.5 mV
Pulse Gen Serial Number: 7255600

## 2021-03-15 ENCOUNTER — Other Ambulatory Visit (HOSPITAL_COMMUNITY): Payer: Self-pay | Admitting: Cardiology

## 2021-03-15 ENCOUNTER — Other Ambulatory Visit (HOSPITAL_COMMUNITY): Payer: Self-pay | Admitting: Internal Medicine

## 2021-03-19 NOTE — Progress Notes (Signed)
Remote ICD transmission.   

## 2021-04-17 ENCOUNTER — Other Ambulatory Visit (HOSPITAL_COMMUNITY): Payer: Self-pay | Admitting: Internal Medicine

## 2021-04-17 DIAGNOSIS — I10 Essential (primary) hypertension: Secondary | ICD-10-CM

## 2021-04-17 DIAGNOSIS — I4510 Unspecified right bundle-branch block: Secondary | ICD-10-CM

## 2021-04-17 DIAGNOSIS — I5022 Chronic systolic (congestive) heart failure: Secondary | ICD-10-CM

## 2021-04-17 DIAGNOSIS — I255 Ischemic cardiomyopathy: Secondary | ICD-10-CM

## 2021-04-30 DIAGNOSIS — L259 Unspecified contact dermatitis, unspecified cause: Secondary | ICD-10-CM | POA: Diagnosis not present

## 2021-04-30 DIAGNOSIS — L282 Other prurigo: Secondary | ICD-10-CM | POA: Diagnosis not present

## 2021-04-30 DIAGNOSIS — I130 Hypertensive heart and chronic kidney disease with heart failure and stage 1 through stage 4 chronic kidney disease, or unspecified chronic kidney disease: Secondary | ICD-10-CM | POA: Diagnosis not present

## 2021-04-30 DIAGNOSIS — I5022 Chronic systolic (congestive) heart failure: Secondary | ICD-10-CM | POA: Diagnosis not present

## 2021-04-30 DIAGNOSIS — N1831 Chronic kidney disease, stage 3a: Secondary | ICD-10-CM | POA: Diagnosis not present

## 2021-05-07 DIAGNOSIS — N1831 Chronic kidney disease, stage 3a: Secondary | ICD-10-CM | POA: Diagnosis not present

## 2021-05-07 DIAGNOSIS — E785 Hyperlipidemia, unspecified: Secondary | ICD-10-CM | POA: Diagnosis not present

## 2021-05-07 DIAGNOSIS — I130 Hypertensive heart and chronic kidney disease with heart failure and stage 1 through stage 4 chronic kidney disease, or unspecified chronic kidney disease: Secondary | ICD-10-CM | POA: Diagnosis not present

## 2021-05-07 DIAGNOSIS — I5022 Chronic systolic (congestive) heart failure: Secondary | ICD-10-CM | POA: Diagnosis not present

## 2021-05-15 ENCOUNTER — Other Ambulatory Visit (HOSPITAL_COMMUNITY): Payer: Self-pay

## 2021-05-15 MED ORDER — ENTRESTO 24-26 MG PO TABS: 1.0000 | ORAL_TABLET | Freq: Two times a day (BID) | ORAL | 0 refills | Status: DC

## 2021-06-04 ENCOUNTER — Ambulatory Visit (INDEPENDENT_AMBULATORY_CARE_PROVIDER_SITE_OTHER): Payer: PPO

## 2021-06-04 DIAGNOSIS — I255 Ischemic cardiomyopathy: Secondary | ICD-10-CM

## 2021-06-04 LAB — CUP PACEART REMOTE DEVICE CHECK
Battery Remaining Longevity: 52 mo
Battery Remaining Percentage: 49 %
Battery Voltage: 2.99 V
Brady Statistic RV Percent Paced: 1 %
Date Time Interrogation Session: 20220628032406
HighPow Impedance: 68 Ohm
HighPow Impedance: 68 Ohm
Implantable Lead Implant Date: 20160323
Implantable Lead Location: 753860
Implantable Pulse Generator Implant Date: 20160323
Lead Channel Impedance Value: 360 Ohm
Lead Channel Pacing Threshold Amplitude: 0.5 V
Lead Channel Pacing Threshold Pulse Width: 0.5 ms
Lead Channel Sensing Intrinsic Amplitude: 12 mV
Lead Channel Setting Pacing Amplitude: 2.5 V
Lead Channel Setting Pacing Pulse Width: 0.5 ms
Lead Channel Setting Sensing Sensitivity: 0.5 mV
Pulse Gen Serial Number: 7255600

## 2021-06-18 ENCOUNTER — Other Ambulatory Visit (HOSPITAL_COMMUNITY): Payer: Self-pay | Admitting: Unknown Physician Specialty

## 2021-06-18 MED ORDER — CARVEDILOL 6.25 MG PO TABS: 3.1250 mg | ORAL_TABLET | Freq: Two times a day (BID) | ORAL | 6 refills | Status: DC

## 2021-06-21 ENCOUNTER — Telehealth: Payer: Self-pay

## 2021-06-21 ENCOUNTER — Encounter (HOSPITAL_COMMUNITY): Payer: Self-pay

## 2021-06-21 DIAGNOSIS — R002 Palpitations: Secondary | ICD-10-CM

## 2021-06-21 NOTE — Telephone Encounter (Signed)
Merlin alert for successful ATP therapy. 6 events for NSVT 7/9, EGM's show irregular tachy rhythm. 16 NSVT 7/14 also irregular tachy rhythm. 7/14 9:55 sustained VT, falling in the VT-2 zone, rate 218.  ATP x1 slowing to irregular tachy rhythm Pt. has Hx of atrial arrhythmia, eliquis was stopped post ablation, ASA only.  Spoke with pt.  At time of event on 7/14 @9 :55am, pt reports he felt his heart racing and felt as though something was "off" he wouldn't call it lightheadedness but something just didn't feel right.  He rested for a bot and then about an hour later was able to get up and go for a walk as he felt fine.    Pt also reports having similar episode the night of 7/8.  Noted pt had an episode of NSVT- 5 beats, average V rate of 179.    Pt meds include Carvedilol 3.125mg  BID, Entresto 24-26mg  BID.   Pt confirmed compliance with meds as ordered.  Educated aptient on restrictions for operating a motor vehicle for 6 months from date of appropriate treatment.    Sending message to scheduling to contact Pt to schedule appt with Dr. Allred/ EP APP.

## 2021-06-22 NOTE — Telephone Encounter (Signed)
Initially irregular arrhythmia is corning for afib.  He then develops a regularized arrhythmia (likely VT) which terminated with ATP.  Subsequently, his rhythm was irregular again.   Recs: Office visit with EP APP to further assess for arrhythmia. Offer Zio monitoring to evaluate for asymptomatic arrhythmia as well as KardiaMobile or Apple Watch to further classify his palpitations.  Increase coreg to 6.25mg  BID if BP allows.

## 2021-06-24 ENCOUNTER — Ambulatory Visit (INDEPENDENT_AMBULATORY_CARE_PROVIDER_SITE_OTHER): Payer: PPO

## 2021-06-24 DIAGNOSIS — R002 Palpitations: Secondary | ICD-10-CM

## 2021-06-24 MED ORDER — METOPROLOL SUCCINATE ER 25 MG PO TB24: 25.0000 mg | ORAL_TABLET | Freq: Every day | ORAL | 3 refills | Status: DC

## 2021-06-24 NOTE — Progress Notes (Unsigned)
Patient enrolled for [employer] to mail a 10 day ZIO XT monitor to address on file.

## 2021-06-24 NOTE — Telephone Encounter (Signed)
Made follow up appointment with APP 07/15/21. Gave patient recommendations from Dr. Johney Frame. Order 10 day Zio after speaking to Otilio Saber, [place]. Patients BP runs 100's/60's discussed with Mardelle Matte about lower BP and Coreg. Advised to discontinue Coreg and start Toprol XL 25 mg q HS. Will send instruction over mychart. Patient in agreement and verbalized understanding.

## 2021-06-24 NOTE — Telephone Encounter (Signed)
Zackariah is returning Kyle Bullock's call. Please advise.

## 2021-06-24 NOTE — Telephone Encounter (Signed)
Left message to call back  

## 2021-06-25 NOTE — Progress Notes (Signed)
Remote ICD transmission.   

## 2021-07-01 ENCOUNTER — Telehealth: Payer: Self-pay | Admitting: Internal Medicine

## 2021-07-01 DIAGNOSIS — R002 Palpitations: Secondary | ICD-10-CM

## 2021-07-01 NOTE — Telephone Encounter (Signed)
Patient states the instructions for his zio monitor say to put his monitor on the left side of his chest but he has a defibrillator in the way.

## 2021-07-01 NOTE — Telephone Encounter (Signed)
The normal placement of an ICD is a little higher and to the left of where the zio patch his usually placed.  If you need to alter the placement a little to avoid placing the patch on top of your device that is fine.  Try to keep the arrow pointing up.  This is usually right under the left collarbone, but it can be dropped down a bit. If all else fails, the ZIO Patch can also be placed on the back.  You would obviously need someone to apply the patch for you if you choose this option. Please give me a call if you need further assistance.   I rhythm is also available to answer any questions 24/7.

## 2021-07-14 NOTE — Progress Notes (Signed)
d   Electrophysiology Office Note Date: 07/15/2021  ID:  Kyle Bullock, Kyle Bullock 12-01-1951, MRN [id number]  PCP: Cleatis Polka., MD Primary Cardiologist: None Electrophysiologist: Hillis Range, MD   CC: Routine ICD follow-up  Kyle Bullock is a 70 y.o. male seen today for Hillis Range, MD for acute visit due to ICD therapies .    Review of electrograms by Dr. Johney Frame shows initially irregular arrhythmia concerning for Afib. He then developed a regular tachy arrhythmia (likely VT) which was terminated with ATP. After ATP, his rhythm was again regular.    As patient has a single chamber ICD, Zio patch placed to assess for asymptomatic arrhythmia. Confirmed with Zio that this is in transist, should arrive any day now, and is marked to expedite it's upload on it's arrival.   Since last being seen in our clinic the patient reports doing overall OK. Has been taking all medications as prescribed. Actually feels better since changing from coreg to metoprolol. He had episode of chest heaviness on 7/8 which does correlate with some NSVT/tachy irregular rhythm on his device. From what he can remember he was asymptomatic with NSVT/tachy irregular rhythm -> tachy regular rhythm resulting in ATP. He denies SOB.  He denies CP with exertion. Apart from the episode of chest pressure 7/8, he has not had anything reminiscent of his prior MIs. He has NOT had syncope or full ICD shock.    Device History: St. Jude Single Chamber ICD implanted 2016 for chronic systolic CHF / ICM   Past Medical History:  Diagnosis Date   Abnormal TSH    a. 11/2014: felt d/t sick euthyroid.   AICD (automatic cardioverter/defibrillator) present 02/28/2015   CAD (coronary artery disease)    a. 11/2014: arrest/STEMI s/p Xience DES to the LAD and PTCA to the D1 [place], [place]). b. Relook cath 11/2014: no culprit, patent stent.   CHF (congestive heart failure) (HCC)    Dressler's syndrome (HCC)    a. 11/2014: tx with colchicine  and prednisone.   Dyslipidemia    Dyspnea    a. 11/2014: possibly due to combo of CHF and Brilinta. Brilinta changed to Effient.   Erectile dysfunction    a. Pt aware not to take ED med within 24 hr of NTG and vice versa.   Hypertension    Ischemic cardiomyopathy    a. 11/2014: EF reportedly 20% by cath and echo in [place].   NSVT (nonsustained ventricular tachycardia) (HCC) 11/16/14   a. 11/2014: admitted after discharge from STEMI admission, 18 beats 135-140bpm in ED.   Pericardial effusion    a. 11/2014.   Pericarditis dx'd 11/2014   Pneumonia 1990's X 2   RBBB    Sleep apnea    suspected but not diagnosed (02/28/2015)   STEMI (ST elevation myocardial infarction) (HCC) 11/10/2014   Tobacco abuse    VF (ventricular fibrillation) (HCC) 11/10/14   a. 11/2014: arrest with STEMI.   Past Surgical History:  Procedure Laterality Date   A-FLUTTER ABLATION N/A 09/04/2020   Procedure: A-FLUTTER ABLATION;  Surgeon: Hillis Range, MD;  [facility]: [facility] INVAS[facility];  Service: Cardiovascular;  Laterality: N/A;   CARDIAC CATHETERIZATION  11/19/2014   CARDIAC DEFIBRILLATOR PLACEMENT  02/28/2015   CORONARY ANGIOPLASTY WITH STENT PLACEMENT  11/10/2014   LAD DES   HAND SURGERY Bilateral    Dupuytren's Contracture   IMPLANTABLE CARDIOVERTER DEFIBRILLATOR IMPLANT N/A 02/28/2015   SJM Fortify Assura VR ICD implanted by Dr Johney Frame.  Revascularized following VF arrest,  EF remained depressed and ICD implanted   KNEE ARTHROSCOPY Right 1982   LEFT HEART CATH N/A 11/19/2014   Procedure: LEFT HEART CATH;  Surgeon: Runell GessJonathan J Berry, MD;  [facility];  Service: Cardiovascular;  Laterality: N/A;    Current Outpatient Medications  Medication Sig Dispense Refill   acetaminophen (TYLENOL) 500 MG tablet Take 1,000 mg by mouth every 6 (six) hours as needed for moderate pain.     ANORO ELLIPTA 62.5-25 MCG/INH AEPB Inhale 1 puff into the lungs daily.  11   aspirin EC 81 MG tablet Take 1 tablet (81 mg  total) by mouth daily. Swallow whole. 90 tablet 3   atorvastatin (LIPITOR) 20 MG tablet TAKE 1 TABLET BY MOUTH DAILY AT 6 PM 90 tablet 3   colchicine 0.6 MG tablet Take 0.6 mg by mouth daily as needed (gout flare).      empagliflozin (JARDIANCE) 10 MG TABS tablet Take 1 tablet (10 mg total) by mouth daily before breakfast. 30 tablet 11   furosemide (LASIX) 20 MG tablet TAKE 1 TABLET(20 MG) BY MOUTH DAILY 90 tablet 3   levothyroxine (SYNTHROID) 75 MCG tablet Take 75 mcg by mouth daily before breakfast.     loratadine (CLARITIN) 10 MG tablet Take 10 mg by mouth daily.     metoprolol succinate (TOPROL XL) 25 MG 24 hr tablet Take 1 tablet (25 mg total) by mouth at bedtime. 90 tablet 3   nitroGLYCERIN (NITROSTAT) 0.4 MG SL tablet Place 1 tablet (0.4 mg total) under the tongue every 5 (five) minutes as needed for chest pain (MAX 3 TABLETS). 10 tablet 3   sacubitril-valsartan (ENTRESTO) 24-26 MG Take 1 tablet by mouth 2 (two) times daily. 180 tablet 0   spironolactone (ALDACTONE) 25 MG tablet TAKE 1 TABLET BY MOUTH DAILY 90 tablet 3   triamcinolone cream (KENALOG) 0.5 % Apply topically as needed.     VASCEPA 1 g capsule Take 2 g by mouth 2 (two) times daily.      No current facility-administered medications for this visit.    Allergies:   Ambien [zolpidem tartrate], Lisinopril, and Brilinta [ticagrelor]   Social History: Social History   Socioeconomic History   Marital status: Married    Spouse name: Not on file   Number of children: 2   Years of education: Not on file   Highest education level: Not on file  Occupational History   Not on file  Tobacco Use   Smoking status: Former    Packs/day: 2.00    Years: 40.00    Pack years: 80.00    Types: Cigarettes    Quit date: 11/10/2014    Years since quitting: 6.6   Smokeless tobacco: Never  Vaping Use   Vaping Use: Never used  Substance and Sexual Activity   Alcohol use: Yes    Alcohol/week: 14.0 standard drinks    Types: 14 Glasses of  wine per week    Comment: 02/28/2015 "2, 4oz glasses of wine/night"   Drug use: No   Sexual activity: Not Currently  Other Topics Concern   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  [place]   Social Determinants of Health   Financial Resource Strain: Not on file  Food Insecurity: Not on file  Transportation Needs: Not on file  Physical Activity: Not on file  Stress: Not on file  Social Connections: Not on file  Intimate Partner  Violence: Not on file    Family History: Family History  Problem Relation Age of Onset   COPD Mother    Hypertension Mother    Hypertension Father    CAD Neg Hx     Review of Systems: All other systems reviewed and are otherwise negative except as noted above.   Physical Exam: Vitals:   07/15/21 1135  BP: (!) 100/58  Pulse: (!) 58  SpO2: 97%  Weight: 204 lb 3.2 oz (92.6 kg)  Height: 6\' 3"  (1.905 m)     GEN- The patient is well appearing, alert and oriented x 3 today.   HEENT: normocephalic, atraumatic; sclera clear, conjunctiva pink; hearing intact; oropharynx clear; neck supple, no JVP Lymph- no cervical lymphadenopathy Lungs- Clear to ausculation bilaterally, normal work of breathing.  No wheezes, rales, rhonchi Heart- Regular rate and rhythm, no murmurs, rubs or gallops, PMI not laterally displaced GI- soft, non-tender, non-distended, bowel sounds present, no hepatosplenomegaly Extremities- no clubbing or cyanosis. No edema; DP/PT/radial pulses 2+ bilaterally MS- no significant deformity or atrophy Skin- warm and dry, no rash or lesion; ICD pocket well healed Psych- euthymic mood, full affect Neuro- strength and sensation are intact  ICD interrogation- reviewed in detail today,  See PACEART report  EKG:  EKG is ordered today. Personal review of EKG ordered today shows bradycardia 58 bpm  Recent Labs: 09/02/2020: Hemoglobin 13.9; Magnesium 2.0; Platelets  276 12/31/2020: BUN 17; Creatinine, Ser 1.26; Potassium 4.9; Sodium 140   Wt Readings from Last 3 Encounters:  07/15/21 204 lb 3.2 oz (92.6 kg)  12/31/20 211 lb 12.8 oz (96.1 kg)  10/18/20 210 lb 12.8 oz (95.6 kg)     Other studies Reviewed: Additional studies/ records that were reviewed today include: Previous EP office notes. Recent remotes   Assessment and Plan:  1.  Chronic systolic dysfunction s/p St. Jude single chamber ICD  Echo 05/16/2020 LVEF 20-25% euvolemic today Stable on an appropriate medical regimen Normal ICD function See Pace Art report No changes today  2. CAD He had an episode of chest pressure on 7/8 which correlates with rapid irregular rhythm on his device. More likely demand ischemia, but moderately , but I will defer ischemic work up to Dr. 9/8 who sees him 07/17/21.     3. Atrial flutter s/p CTI ablation by Dr. 09/16/21 09/04/20 Eliquis stopped 10/18/2020 with no recurrence  Zio patch is pending to look for atrial arryhtmias given irregular tachy rhythms leading up to his ATP (which does appear to be a tachy-regular rhythm.    4. VT Regular tachy rhythm requiring ATP concerning for VT, though confounded in the setting of irregular tachy rhythms on 7/8 and 7/14. Optimize BB as tolerated Will defer ischemic work up to Dr. 8/14, given his history of stenting, chest pressure on 7/8 described as "elephant sitting on his chest", and recent ICD therapy, I think his pre-test probability is rather high. I suspect a Myoview would be "high risk" given his EF and not especially helpful.  He sees Dr. 9/8 on Wednesday. Review NCDMV guidelines with patient of no driving x 6 month after appropriate device therapy.  Current medicines are reviewed at length with the patient today.   The patient does not have concerns regarding his medicines.  The following changes were made today:  none  Labs/ tests ordered today include:  Orders Placed This Encounter   Procedures   Comprehensive metabolic panel   TSH   CBC   Magnesium   CUP  PACEART INCLINIC DEVICE CHECK   EKG 12-Lead    Disposition:   Follow up with EP APP  6 weeks pending labs and further work up. +/- ischemic work up per Dr. Gala Romney.  Reviewed plan above with Dr. Johney Frame    Signed, Graciella Freer, PA-C  07/15/2021 2:14 PM  [facility] [address] [place] [address] [phone] (office) [phone] (fax)

## 2021-07-15 ENCOUNTER — Ambulatory Visit: Payer: PPO | Admitting: Student

## 2021-07-15 ENCOUNTER — Other Ambulatory Visit: Payer: Self-pay

## 2021-07-15 ENCOUNTER — Encounter: Payer: Self-pay | Admitting: Student

## 2021-07-15 VITALS — BP 100/58 | HR 58 | Ht 75.0 in | Wt 204.2 lb

## 2021-07-15 DIAGNOSIS — I2510 Atherosclerotic heart disease of native coronary artery without angina pectoris: Secondary | ICD-10-CM | POA: Diagnosis not present

## 2021-07-15 DIAGNOSIS — I483 Typical atrial flutter: Secondary | ICD-10-CM

## 2021-07-15 DIAGNOSIS — I519 Heart disease, unspecified: Secondary | ICD-10-CM | POA: Diagnosis not present

## 2021-07-15 DIAGNOSIS — I255 Ischemic cardiomyopathy: Secondary | ICD-10-CM | POA: Diagnosis not present

## 2021-07-15 DIAGNOSIS — I4901 Ventricular fibrillation: Secondary | ICD-10-CM | POA: Diagnosis not present

## 2021-07-15 DIAGNOSIS — R002 Palpitations: Secondary | ICD-10-CM | POA: Diagnosis not present

## 2021-07-15 LAB — COMPREHENSIVE METABOLIC PANEL
ALT: 16 IU/L (ref 0–44)
AST: 18 IU/L (ref 0–40)
Albumin/Globulin Ratio: 2.1 (ref 1.2–2.2)
Albumin: 4.5 g/dL (ref 3.7–4.7)
Alkaline Phosphatase: 81 IU/L (ref 44–121)
BUN/Creatinine Ratio: 19 (ref 10–24)
BUN: 21 mg/dL (ref 8–27)
Bilirubin Total: 0.4 mg/dL (ref 0.0–1.2)
CO2: 22 mmol/L (ref 20–29)
Calcium: 9.9 mg/dL (ref 8.6–10.2)
Chloride: 100 mmol/L (ref 96–106)
Creatinine, Ser: 1.08 mg/dL (ref 0.76–1.27)
Globulin, Total: 2.1 g/dL (ref 1.5–4.5)
Glucose: 100 mg/dL — ABNORMAL HIGH (ref 65–99)
Potassium: 5.4 mmol/L — ABNORMAL HIGH (ref 3.5–5.2)
Sodium: 137 mmol/L (ref 134–144)
Total Protein: 6.6 g/dL (ref 6.0–8.5)
eGFR: 73 mL/min/{1.73_m2} (ref >59)

## 2021-07-15 LAB — CUP PACEART INCLINIC DEVICE CHECK
Battery Remaining Longevity: 51 mo
Brady Statistic RV Percent Paced: 0.46 %
Date Time Interrogation Session: 20220808140547
HighPow Impedance: 75.375
Implantable Lead Implant Date: 20160323
Implantable Lead Location: 753860
Implantable Pulse Generator Implant Date: 20160323
Lead Channel Impedance Value: 375 Ohm
Lead Channel Pacing Threshold Amplitude: 0.75 V
Lead Channel Pacing Threshold Amplitude: 0.75 V
Lead Channel Pacing Threshold Pulse Width: 0.5 ms
Lead Channel Pacing Threshold Pulse Width: 0.5 ms
Lead Channel Sensing Intrinsic Amplitude: 11.9 mV
Lead Channel Setting Pacing Amplitude: 2.5 V
Lead Channel Setting Pacing Pulse Width: 0.5 ms
Lead Channel Setting Sensing Sensitivity: 0.5 mV
Pulse Gen Serial Number: 7255600

## 2021-07-15 LAB — CBC
Hematocrit: 45.6 % (ref 37.5–51.0)
Hemoglobin: 15.8 g/dL (ref 13.0–17.7)
MCH: 30.4 pg (ref 26.6–33.0)
MCHC: 34.6 g/dL (ref 31.5–35.7)
MCV: 88 fL (ref 79–97)
Platelets: 305 10*3/uL (ref 150–450)
RBC: 5.2 x10E6/uL (ref 4.14–5.80)
RDW: 12.8 % (ref 11.6–15.4)
WBC: 6.1 10*3/uL (ref 3.4–10.8)

## 2021-07-15 LAB — TSH: TSH: 3.12 u[IU]/mL (ref 0.450–4.500)

## 2021-07-15 LAB — MAGNESIUM: Magnesium: 2.5 mg/dL — ABNORMAL HIGH (ref 1.6–2.3)

## 2021-07-15 NOTE — Patient Instructions (Signed)
Medication Instructions:  Your physician recommends that you continue on your current medications as directed. Please refer to the Current Medication list given to you today.  *If you need a refill on your cardiac medications before your next appointment, please call your pharmacy*   Lab Work: TODAY: CMET, CBC, TSH, Mg  If you have labs (blood work) drawn today and your tests are completely normal, you will receive your results only by: MyChart Message (if you have MyChart) OR A paper copy in the mail If you have any lab test that is abnormal or we need to change your treatment, we will call you to review the results.   Follow-Up: At [facility], you and your health needs are our priority.  As part of our continuing mission to provide you with exceptional heart care, we have created designated Provider Care Teams.  These Care Teams include your primary Cardiologist (physician) and Advanced Practice Providers (APPs -  Physician Assistants and Nurse Practitioners) who all work together to provide you with the care you need, when you need it.   Your next appointment:   08/26/2021 with Otilio Saber, PA 3 month(s)  The format for your next appointment:   In Person  Provider:   Hillis Range, MD

## 2021-07-16 DIAGNOSIS — R002 Palpitations: Secondary | ICD-10-CM | POA: Diagnosis not present

## 2021-07-17 ENCOUNTER — Encounter (HOSPITAL_COMMUNITY): Payer: Self-pay | Admitting: Internal Medicine

## 2021-07-17 ENCOUNTER — Other Ambulatory Visit: Payer: Self-pay

## 2021-07-17 ENCOUNTER — Ambulatory Visit (HOSPITAL_COMMUNITY)
Admission: RE | Admit: 2021-07-17 | Discharge: 2021-07-17 | Disposition: A | Discharge status: Home / Self Care | Payer: PPO | Source: Ambulatory Visit | Attending: Internal Medicine | Admitting: Internal Medicine

## 2021-07-17 ENCOUNTER — Other Ambulatory Visit (HOSPITAL_COMMUNITY): Payer: Self-pay | Admitting: Internal Medicine

## 2021-07-17 VITALS — BP 118/70 | HR 72 | Wt 206.4 lb

## 2021-07-17 DIAGNOSIS — Z888 Allergy status to other drugs, medicaments and biological substances status: Secondary | ICD-10-CM | POA: Insufficient documentation

## 2021-07-17 DIAGNOSIS — Z955 Presence of coronary angioplasty implant and graft: Secondary | ICD-10-CM | POA: Diagnosis not present

## 2021-07-17 DIAGNOSIS — Z8249 Family history of ischemic heart disease and other diseases of the circulatory system: Secondary | ICD-10-CM | POA: Diagnosis not present

## 2021-07-17 DIAGNOSIS — I5022 Chronic systolic (congestive) heart failure: Secondary | ICD-10-CM

## 2021-07-17 DIAGNOSIS — Z4502 Encounter for adjustment and management of automatic implantable cardiac defibrillator: Secondary | ICD-10-CM | POA: Diagnosis not present

## 2021-07-17 DIAGNOSIS — Z7982 Long term (current) use of aspirin: Secondary | ICD-10-CM | POA: Insufficient documentation

## 2021-07-17 DIAGNOSIS — I4510 Unspecified right bundle-branch block: Secondary | ICD-10-CM

## 2021-07-17 DIAGNOSIS — J439 Emphysema, unspecified: Secondary | ICD-10-CM | POA: Diagnosis not present

## 2021-07-17 DIAGNOSIS — I2510 Atherosclerotic heart disease of native coronary artery without angina pectoris: Secondary | ICD-10-CM

## 2021-07-17 DIAGNOSIS — Z95810 Presence of automatic (implantable) cardiac defibrillator: Secondary | ICD-10-CM | POA: Diagnosis not present

## 2021-07-17 DIAGNOSIS — Z87891 Personal history of nicotine dependence: Secondary | ICD-10-CM | POA: Insufficient documentation

## 2021-07-17 DIAGNOSIS — I10 Essential (primary) hypertension: Secondary | ICD-10-CM

## 2021-07-17 DIAGNOSIS — I110 Hypertensive heart disease with heart failure: Secondary | ICD-10-CM | POA: Diagnosis not present

## 2021-07-17 DIAGNOSIS — I252 Old myocardial infarction: Secondary | ICD-10-CM | POA: Diagnosis not present

## 2021-07-17 DIAGNOSIS — I255 Ischemic cardiomyopathy: Secondary | ICD-10-CM | POA: Insufficient documentation

## 2021-07-17 DIAGNOSIS — I471 Supraventricular tachycardia: Secondary | ICD-10-CM

## 2021-07-17 DIAGNOSIS — Z79899 Other long term (current) drug therapy: Secondary | ICD-10-CM | POA: Insufficient documentation

## 2021-07-17 DIAGNOSIS — R0789 Other chest pain: Secondary | ICD-10-CM

## 2021-07-17 LAB — CBC
HCT: 46.1 % (ref 39.0–52.0)
Hemoglobin: 15.7 g/dL (ref 13.0–17.0)
MCH: 30.8 pg (ref 26.0–34.0)
MCHC: 34.1 g/dL (ref 30.0–36.0)
MCV: 90.6 fL (ref 80.0–100.0)
Platelets: 306 10*3/uL (ref 150–400)
RBC: 5.09 MIL/uL (ref 4.22–5.81)
RDW: 13 % (ref 11.5–15.5)
WBC: 7.4 10*3/uL (ref 4.0–10.5)
nRBC: 0 % (ref 0.0–0.2)

## 2021-07-17 LAB — BASIC METABOLIC PANEL
Anion gap: 10 (ref 5–15)
BUN: 20 mg/dL (ref 8–23)
CO2: 25 mmol/L (ref 22–32)
Calcium: 9.4 mg/dL (ref 8.9–10.3)
Chloride: 101 mmol/L (ref 98–111)
Creatinine, Ser: 1.1 mg/dL (ref 0.61–1.24)
GFR, Estimated: >60 mL/min (ref >60)
Glucose, Bld: 108 mg/dL — ABNORMAL HIGH (ref 70–99)
Potassium: 4.9 mmol/L (ref 3.5–5.1)
Sodium: 136 mmol/L (ref 135–145)

## 2021-07-17 MED ORDER — SPIRONOLACTONE 25 MG PO TABS: 12.5000 mg | ORAL_TABLET | Freq: Every day | ORAL | 3 refills | Status: DC

## 2021-07-17 NOTE — H&P (View-Only) (Signed)
Patient ID: Kyle Bullock, male   DOB: [birth year], 70 y.o.   MRN: [id number]    Advanced Heart Failure Clinic Note    History of Present Illness: Kyle Bullock is a 70 y.o. male who is referred to the HF Clinic by Dr. Excell Seltzer. He has h/o CAD and ischemic cardiomyopathy 20-25%.  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    In December 2015, he suffered an acute anterolateral MI complicated by VF arrest in [place]. He underwent drug-eluting stent placement to the LAD and PTCA of the diagonal. EF was 25% and a life vest was placed prior to discharge. Following return to [facility], he developed dyspnea and presented to [facility] where he was readmitted. His Brilinta was switched to Effient as it was felt the Brilinta may have been playing a role in his dyspnea. He was also switched from an ACE inhibitor to an ARB. He was seen by electrophysiology with recommendation for ongoing life vest therapy and follow-up echo in 90 days post revascularization. He was then seen in clinic in mid December where he reported pleuritic chest pain. He was readmitted and underwent diagnostic catheterization revealing patency of previously placed stent and area of balloon angioplasty in the diagonal. EF was 20-25% with diffuse hypokinesis and normal pericardial appearance. He did have some hypotension requiring discontinuation of ARB therapy. His beta blocker was also down titrated. He was diagnosed with Dressler's syndrome and was placed on prednisone taper and colchicine.   CT with COPD. Referred to Dr. Kendrick Fries who felt he had mild COPD but no ILD. Given PRN albuterol. Has repeat CT scheduled for 1 year.   Had ATP in 7/22. Found on device interrogation. Seen by EP. According to EP device showed initially irregular arrhythmia concerning for Afib. He then developed a regular tachy arrhythmia (likely VT) which was terminated with ATP. After ATP, his rhythm was again regular. Zio placed showed SVT with some  ectopy. No VT or AF.   On Friday 7/8 came home from dinner party developed SSCP reminiscent of previous angina. Responsive to 2NTG. Has not had any recurrence   He returns today for regular follow up. Doing fairly well since his episode on 7/8. Had COVID last week but overall he remains very active without any recurrent CP or undue SOB. K was 5.4 so spiro cut in half. Mg 2.5  Echo 06/05/20 EF 25%    ICD interrogated personally. No VT/AF Fluid is dry. Personally reviewed    Echo 3/16 EF 25-30% Echo 03/2016 EF 20-25% Echo 9/18 EF 20-25% RV normal   CPX 2/18 FVC 3.49 (72%)      FEV1 2.49 (68%)        FEV1/FVC 71 (93%)        MVV 117 (82%) Resting HR: 62 Peak HR: 148   (97% age predicted max HR) BP rest: 84/60 BP peak: 156/62 Peak VO2: 19.6 (69% predicted peak VO2) VE/VCO2 slope:  37 OUES: 2.14 Peak RER: 1.06 Ventilatory Threshold: 17.4 (61% predicted or measured peak VO2) VE/MVV:  66% O2pulse:  12   (71% predicted O2pulse)  CPX 05/14/2015  Peak VO2: 19.0 (64.5% predicted peak VO2) VE/VCO2 slope:  41.5 OUES: 2.01 Peak RER: 1.06 Ventilatory Threshold: 14.1          (47.9% predicted or measured peak VO2)   Past Medical History:  Diagnosis Date   Abnormal TSH    a. 11/2014: felt d/t sick euthyroid.   AICD (automatic cardioverter/defibrillator) present  02/28/2015   CAD (coronary artery disease)    a. 11/2014: arrest/STEMI s/p Xience DES to the LAD and PTCA to the D1 [place], [place]). b. Relook cath 11/2014: no culprit, patent stent.   CHF (congestive heart failure) (HCC)    Dressler's syndrome (HCC)    a. 11/2014: tx with colchicine and prednisone.   Dyslipidemia    Dyspnea    a. 11/2014: possibly due to combo of CHF and Brilinta. Brilinta changed to Effient.   Erectile dysfunction    a. Pt aware not to take ED med within 24 hr of NTG and vice versa.   Hypertension    Ischemic cardiomyopathy    a. 11/2014: EF reportedly 20% by cath and echo in [place].   NSVT  (nonsustained ventricular tachycardia) (HCC) 11/16/14   a. 11/2014: admitted after discharge from STEMI admission, 18 beats 135-140bpm in ED.   Pericardial effusion    a. 11/2014.   Pericarditis dx'd 11/2014   Pneumonia 1990's X 2   RBBB    Sleep apnea    suspected but not diagnosed (02/28/2015)   STEMI (ST elevation myocardial infarction) (HCC) 11/10/2014   Tobacco abuse    VF (ventricular fibrillation) (HCC) 11/10/14   a. 11/2014: arrest with STEMI.    Past Surgical History:  Procedure Laterality Date   A-FLUTTER ABLATION N/A 09/04/2020   Procedure: A-FLUTTER ABLATION;  Surgeon: Hillis Range, MD;  Location: [facility] INVASIVE CV LAB;  Service: Cardiovascular;  Laterality: N/A;   CARDIAC CATHETERIZATION  11/19/2014   CARDIAC DEFIBRILLATOR PLACEMENT  02/28/2015   CORONARY ANGIOPLASTY WITH STENT PLACEMENT  11/10/2014   LAD DES   HAND SURGERY Bilateral    Dupuytren's Contracture   IMPLANTABLE CARDIOVERTER DEFIBRILLATOR IMPLANT N/A 02/28/2015   SJM Fortify Assura VR ICD implanted by Dr Johney Frame.  Revascularized following VF arrest, EF remained depressed and ICD implanted   KNEE ARTHROSCOPY Right 1982   LEFT HEART CATH N/A 11/19/2014   Procedure: LEFT HEART CATH;  Surgeon: Runell Gess, MD;  Location: [facility] CATH LAB;  Service: Cardiovascular;  Laterality: N/A;    Current Outpatient Medications  Medication Sig Dispense Refill   acetaminophen (TYLENOL) 500 MG tablet Take 1,000 mg by mouth every 6 (six) hours as needed for moderate pain.     ANORO ELLIPTA 62.5-25 MCG/INH AEPB Inhale 1 puff into the lungs daily.  11   aspirin EC 81 MG tablet Take 1 tablet (81 mg total) by mouth daily. Swallow whole. 90 tablet 3   atorvastatin (LIPITOR) 20 MG tablet TAKE 1 TABLET BY MOUTH DAILY AT 6 PM 90 tablet 3   colchicine 0.6 MG tablet Take 0.6 mg by mouth daily as needed (gout flare).      empagliflozin (JARDIANCE) 10 MG TABS tablet Take 1 tablet (10 mg total) by mouth daily before breakfast. 30 tablet 11    furosemide (LASIX) 20 MG tablet TAKE 1 TABLET(20 MG) BY MOUTH DAILY 90 tablet 3   levothyroxine (SYNTHROID) 75 MCG tablet Take 75 mcg by mouth daily before breakfast.     loratadine (CLARITIN) 10 MG tablet Take 10 mg by mouth daily.     metoprolol succinate (TOPROL XL) 25 MG 24 hr tablet Take 1 tablet (25 mg total) by mouth at bedtime. 90 tablet 3   nitroGLYCERIN (NITROSTAT) 0.4 MG SL tablet Place 1 tablet (0.4 mg total) under the tongue every 5 (five) minutes as needed for chest pain (MAX 3 TABLETS). 10 tablet 3   sacubitril-valsartan (ENTRESTO) 24-26 MG Take 1 tablet  by mouth 2 (two) times daily. 180 tablet 0   spironolactone (ALDACTONE) 25 MG tablet Take 0.5 tablets (12.5 mg total) by mouth daily. 45 tablet 3   triamcinolone cream (KENALOG) 0.5 % Apply topically as needed.     VASCEPA 1 g capsule Take 2 g by mouth 2 (two) times daily.      No current facility-administered medications for this encounter.    Allergies:   Ambien [zolpidem tartrate], Lisinopril, and Brilinta [ticagrelor]   Social History:  The patient  reports that he quit smoking about 6 years ago. His smoking use included cigarettes. He has a 80.00 pack-year smoking history. He has never used smokeless tobacco. He reports current alcohol use of about 14.0 standard drinks of alcohol per week. He reports that he does not use drugs.   Family History:  The patient's  family history includes COPD in his mother; Hypertension in his father and mother.   Review of systems complete and found to be negative unless listed in HPI.     Vitals:   07/17/21 1259  BP: 118/70  Pulse: 72  SpO2: 95%  Weight: 93.6 kg (206 lb 6.4 oz)    Wt Readings from Last 3 Encounters:  07/17/21 93.6 kg (206 lb 6.4 oz)  07/15/21 92.6 kg (204 lb 3.2 oz)  12/31/20 96.1 kg (211 lb 12.8 oz)   Physical exam: General:  Well appearing. No resp difficulty HEENT: normal Neck: supple. no JVD. Carotids 2+ bilat; no bruits. No lymphadenopathy or thryomegaly  appreciated. Cor: PMI nondisplaced. Regular rate & rhythm. No rubs, gallops or murmurs. Lungs: clear Abdomen: soft, nontender, nondistended. No hepatosplenomegaly. No bruits or masses. Good bowel sounds. Extremities: no cyanosis, clubbing, rash, edema Neuro: alert & orientedx3, cranial nerves grossly intact. moves all 4 extremities w/o difficulty. Affect pleasant   Recent Labs: 07/15/2021: ALT 16; BUN 21; Creatinine, Ser 1.08; Hemoglobin 15.8; Magnesium 2.5; Platelets 305; Potassium 5.4; Sodium 137; TSH 3.120   Lipid Panel     Component Value Date/Time   CHOL 108 01/08/2015 0850   CHOL 101 12/04/2014 1030   TRIG 155.0 (H) 01/08/2015 0850   HDL 39.00 (L) 01/08/2015 0850   HDL 43 12/04/2014 1030   CHOLHDL 3 01/08/2015 0850   VLDL 31.0 01/08/2015 0850   LDLCALC 38 01/08/2015 0850   LDLCALC 39 12/04/2014 1030     ASSESSMENT AND PLAN:  1. Chronic systolic due to ischemic CM  - Echo 03/2016 LVEF 20-25%, Grade 2 DD - Echo 3/18 shows with stable LVEF 20-25%. Normal RV.  - Echo 9/18 EF 20-25%, grade 2 DD  - Echo 05/16/20  EF 25% RV normal. - Doing well. Stable NYHA II symptoms - Volume status stable on lasix 20 mg daily.   - Continue Entresto 24/26.  Up-titration Limited by hypotension.  - Continue carvedilol 6.25 mg BID.  - Continue sprio 25 mg daily.   - Continue Farxiga 10 - ICD interrogated personally today and reviewed with EP Clinic. No further episodes.   2.  CAD s/p Anterior MI December 2015:  - Episode on 06/14/21 concerning for angina but no recurrence. Given association with ICD therapy we discussed need for ischemic eval and agree to proceed with R/L cath next week - Continue ASA, statin and Effient  - Lipids per Dr. Clelia Croft.   3. ICD therapy - Device interrogation and Zio monitor reviewed. Appears to be SVT. No clear VT or AF - continue b-blocker - keep K > 4.0 Mg > 2.0 - cath  as above  4. Tobacco abuse:  - Remains quit  5. Emphysema - Sees pulmonary. Spirometry on  CPX previously reviewed with only mild disease.     Hennessy Bartel, MD  1:14 PM   

## 2021-07-17 NOTE — Patient Instructions (Signed)
Heart Catheterization Mon 8/15, see instructions below  Your physician recommends that you schedule a follow-up appointment in: 2 months  If you have any questions or concerns before your next appointment please send [place] a message through [place] or call our office at [phone].    TO LEAVE A MESSAGE FOR THE NURSE SELECT OPTION 2, PLEASE LEAVE A MESSAGE INCLUDING: YOUR NAME DATE OF BIRTH CALL BACK NUMBER REASON FOR CALL**this is important as we prioritize the call backs  YOU WILL RECEIVE A CALL BACK THE SAME DAY AS LONG AS YOU CALL BEFORE 4:00 PM  At the Advanced Heart Failure Clinic, you and your health needs are our priority. As part of our continuing mission to provide you with exceptional heart care, we have created designated Provider Care Teams. These Care Teams include your primary Cardiologist (physician) and Advanced Practice Providers (APPs- Physician Assistants and Nurse Practitioners) who all work together to provide you with the care you need, when you need it.   You may see any of the following providers on your designated Care Team at your next follow up: Dr Arvilla Meres Dr Marca Ancona Dr Brandon Melnick, NP Robbie Lis, [place] Mikki Santee Karle Plumber, PharmD   Please be sure to bring in all your medications bottles to every appointment.     CATHETERIZATION INSTRUCTIONS:  You are scheduled for a Cardiac Catheterization on Monday, August 15 with Dr. Arvilla Meres.  1. Please arrive at the [facility] (Main Entrance A) at [facility]: [address] [place], [place] [address] at 11:00 AM (This time is two hours before your procedure to ensure your preparation). Free valet parking service is available.   Special note: Every effort is made to have your procedure done on time. Please understand that emergencies sometimes delay scheduled procedures.  2. Diet: Do not eat solid foods after midnight.  The patient may have clear liquids  until 5am upon the day of the procedure.  3. Labs: DONE TODAY  4. Medication instructions in preparation for your procedure:   Contrast Allergy: No   Monday 07/22/21 AM DO NOT TAKE:    JARDIANCE, FUROSEMIDE, OR SPIRONOLACTONE  On the morning of your procedure, take your Aspirin and any morning medicines NOT listed above.  You may use sips of water.  5. Plan for one night stay--bring personal belongings. 6. Bring a current list of your medications and current insurance cards. 7. You MUST have a responsible person to drive you home. 8. Someone MUST be with you the first 24 hours after you arrive home or your discharge will be delayed. 9. Please wear clothes that are easy to get on and off and wear slip-on shoes.  Thank you for allowing [place] to care for you!   -- [facility]

## 2021-07-17 NOTE — Progress Notes (Signed)
Patient ID: Kyle Bullock, male   DOB: [birth year], 70 y.o.   MRN: [id number]    Advanced Heart Failure Clinic Note    History of Present Illness: Kyle Bullock is a 70 y.o. male who is referred to the HF Clinic by Dr. Excell Seltzer. He has h/o CAD and ischemic cardiomyopathy 20-25%.  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    In December 2015, he suffered an acute anterolateral MI complicated by VF arrest in [place]. He underwent drug-eluting stent placement to the LAD and PTCA of the diagonal. EF was 25% and a life vest was placed prior to discharge. Following return to [facility], he developed dyspnea and presented to [facility] where he was readmitted. His Brilinta was switched to Effient as it was felt the Brilinta may have been playing a role in his dyspnea. He was also switched from an ACE inhibitor to an ARB. He was seen by electrophysiology with recommendation for ongoing life vest therapy and follow-up echo in 90 days post revascularization. He was then seen in clinic in mid December where he reported pleuritic chest pain. He was readmitted and underwent diagnostic catheterization revealing patency of previously placed stent and area of balloon angioplasty in the diagonal. EF was 20-25% with diffuse hypokinesis and normal pericardial appearance. He did have some hypotension requiring discontinuation of ARB therapy. His beta blocker was also down titrated. He was diagnosed with Dressler's syndrome and was placed on prednisone taper and colchicine.   CT with COPD. Referred to Dr. Kendrick Fries who felt he had mild COPD but no ILD. Given PRN albuterol. Has repeat CT scheduled for 1 year.   Had ATP in 7/22. Found on device interrogation. Seen by EP. According to EP device showed initially irregular arrhythmia concerning for Afib. He then developed a regular tachy arrhythmia (likely VT) which was terminated with ATP. After ATP, his rhythm was again regular. Zio placed showed SVT with some  ectopy. No VT or AF.   On Friday 7/8 came home from dinner party developed SSCP reminiscent of previous angina. Responsive to 2NTG. Has not had any recurrence   He returns today for regular follow up. Doing fairly well since his episode on 7/8. Had COVID last week but overall he remains very active without any recurrent CP or undue SOB. K was 5.4 so spiro cut in half. Mg 2.5  Echo 06/05/20 EF 25%    ICD interrogated personally. No VT/AF Fluid is dry. Personally reviewed    Echo 3/16 EF 25-30% Echo 03/2016 EF 20-25% Echo 9/18 EF 20-25% RV normal   CPX 2/18 FVC 3.49 (72%)      FEV1 2.49 (68%)        FEV1/FVC 71 (93%)        MVV 117 (82%) Resting HR: 62 Peak HR: 148   (97% age predicted max HR) BP rest: 84/60 BP peak: 156/62 Peak VO2: 19.6 (69% predicted peak VO2) VE/VCO2 slope:  37 OUES: 2.14 Peak RER: 1.06 Ventilatory Threshold: 17.4 (61% predicted or measured peak VO2) VE/MVV:  66% O2pulse:  12   (71% predicted O2pulse)  CPX 05/14/2015  Peak VO2: 19.0 (64.5% predicted peak VO2) VE/VCO2 slope:  41.5 OUES: 2.01 Peak RER: 1.06 Ventilatory Threshold: 14.1          (47.9% predicted or measured peak VO2)   Past Medical History:  Diagnosis Date   Abnormal TSH    a. 11/2014: felt d/t sick euthyroid.   AICD (automatic cardioverter/defibrillator) present  02/28/2015   CAD (coronary artery disease)    a. 11/2014: arrest/STEMI s/p Xience DES to the LAD and PTCA to the D1 [place], [place]). b. Relook cath 11/2014: no culprit, patent stent.   CHF (congestive heart failure) (HCC)    Dressler's syndrome (HCC)    a. 11/2014: tx with colchicine and prednisone.   Dyslipidemia    Dyspnea    a. 11/2014: possibly due to combo of CHF and Brilinta. Brilinta changed to Effient.   Erectile dysfunction    a. Pt aware not to take ED med within 24 hr of NTG and vice versa.   Hypertension    Ischemic cardiomyopathy    a. 11/2014: EF reportedly 20% by cath and echo in [place].   NSVT  (nonsustained ventricular tachycardia) (HCC) 11/16/14   a. 11/2014: admitted after discharge from STEMI admission, 18 beats 135-140bpm in ED.   Pericardial effusion    a. 11/2014.   Pericarditis dx'd 11/2014   Pneumonia 1990's X 2   RBBB    Sleep apnea    suspected but not diagnosed (02/28/2015)   STEMI (ST elevation myocardial infarction) (HCC) 11/10/2014   Tobacco abuse    VF (ventricular fibrillation) (HCC) 11/10/14   a. 11/2014: arrest with STEMI.    Past Surgical History:  Procedure Laterality Date   A-FLUTTER ABLATION N/A 09/04/2020   Procedure: A-FLUTTER ABLATION;  Surgeon: Kyle Range, Kyle Bullock;  Location: [facility];  Service: Cardiovascular;  Laterality: N/A;   CARDIAC CATHETERIZATION  11/19/2014   CARDIAC DEFIBRILLATOR PLACEMENT  02/28/2015   CORONARY ANGIOPLASTY WITH STENT PLACEMENT  11/10/2014   LAD DES   HAND SURGERY Bilateral    Dupuytren's Contracture   IMPLANTABLE CARDIOVERTER DEFIBRILLATOR IMPLANT N/A 02/28/2015   SJM Fortify Assura VR ICD implanted by Dr Kyle Bullock.  Revascularized following VF arrest, EF remained depressed and ICD implanted   KNEE ARTHROSCOPY Right 1982   LEFT HEART CATH N/A 11/19/2014   Procedure: LEFT HEART CATH;  Surgeon: Kyle Gess, Kyle Bullock;  Location: [facility] CATH LAB;  Service: Cardiovascular;  Laterality: N/A;    Current Outpatient Medications  Medication Sig Dispense Refill   acetaminophen (TYLENOL) 500 MG tablet Take 1,000 mg by mouth every 6 (six) hours as needed for moderate pain.     ANORO ELLIPTA 62.5-25 MCG/INH AEPB Inhale 1 puff into the lungs daily.  11   aspirin EC 81 MG tablet Take 1 tablet (81 mg total) by mouth daily. Swallow whole. 90 tablet 3   atorvastatin (LIPITOR) 20 MG tablet TAKE 1 TABLET BY MOUTH DAILY AT 6 PM 90 tablet 3   colchicine 0.6 MG tablet Take 0.6 mg by mouth daily as needed (gout flare).      empagliflozin (JARDIANCE) 10 MG TABS tablet Take 1 tablet (10 mg total) by mouth daily before breakfast. 30 tablet 11    furosemide (LASIX) 20 MG tablet TAKE 1 TABLET(20 MG) BY MOUTH DAILY 90 tablet 3   levothyroxine (SYNTHROID) 75 MCG tablet Take 75 mcg by mouth daily before breakfast.     loratadine (CLARITIN) 10 MG tablet Take 10 mg by mouth daily.     metoprolol succinate (TOPROL XL) 25 MG 24 hr tablet Take 1 tablet (25 mg total) by mouth at bedtime. 90 tablet 3   nitroGLYCERIN (NITROSTAT) 0.4 MG SL tablet Place 1 tablet (0.4 mg total) under the tongue every 5 (five) minutes as needed for chest pain (MAX 3 TABLETS). 10 tablet 3   sacubitril-valsartan (ENTRESTO) 24-26 MG Take 1 tablet  by mouth 2 (two) times daily. 180 tablet 0   spironolactone (ALDACTONE) 25 MG tablet Take 0.5 tablets (12.5 mg total) by mouth daily. 45 tablet 3   triamcinolone cream (KENALOG) 0.5 % Apply topically as needed.     VASCEPA 1 g capsule Take 2 g by mouth 2 (two) times daily.      No current facility-administered medications for this encounter.    Allergies:   Ambien [zolpidem tartrate], Lisinopril, and Brilinta [ticagrelor]   Social History:  The patient  reports that he quit smoking about 6 years ago. His smoking use included cigarettes. He has a 80.00 pack-year smoking history. He has never used smokeless tobacco. He reports current alcohol use of about 14.0 standard drinks of alcohol per week. He reports that he does not use drugs.   Family History:  The patient's  family history includes COPD in his mother; Hypertension in his father and mother.   Review of systems complete and found to be negative unless listed in HPI.     Vitals:   07/17/21 1259  BP: 118/70  Pulse: 72  SpO2: 95%  Weight: 93.6 kg (206 lb 6.4 oz)    Wt Readings from Last 3 Encounters:  07/17/21 93.6 kg (206 lb 6.4 oz)  07/15/21 92.6 kg (204 lb 3.2 oz)  12/31/20 96.1 kg (211 lb 12.8 oz)   Physical exam: General:  Well appearing. No resp difficulty HEENT: normal Neck: supple. no JVD. Carotids 2+ bilat; no bruits. No lymphadenopathy or thryomegaly  appreciated. Cor: PMI nondisplaced. Regular rate & rhythm. No rubs, gallops or murmurs. Lungs: clear Abdomen: soft, nontender, nondistended. No hepatosplenomegaly. No bruits or masses. Good bowel sounds. Extremities: no cyanosis, clubbing, rash, edema Neuro: alert & orientedx3, cranial nerves grossly intact. moves all 4 extremities w/o difficulty. Affect pleasant   Recent Labs: 07/15/2021: ALT 16; BUN 21; Creatinine, Ser 1.08; Hemoglobin 15.8; Magnesium 2.5; Platelets 305; Potassium 5.4; Sodium 137; TSH 3.120   Lipid Panel     Component Value Date/Time   CHOL 108 01/08/2015 0850   CHOL 101 12/04/2014 1030   TRIG 155.0 (H) 01/08/2015 0850   HDL 39.00 (L) 01/08/2015 0850   HDL 43 12/04/2014 1030   CHOLHDL 3 01/08/2015 0850   VLDL 31.0 01/08/2015 0850   LDLCALC 38 01/08/2015 0850   LDLCALC 39 12/04/2014 1030     ASSESSMENT AND PLAN:  1. Chronic systolic due to ischemic CM  - Echo 03/2016 LVEF 20-25%, Grade 2 DD - Echo 3/18 shows with stable LVEF 20-25%. Normal RV.  - Echo 9/18 EF 20-25%, grade 2 DD  - Echo 05/16/20  EF 25% RV normal. - Doing well. Stable NYHA II symptoms - Volume status stable on lasix 20 mg daily.   - Continue Entresto 24/26.  Up-titration Limited by hypotension.  - Continue carvedilol 6.25 mg BID.  - Continue sprio 25 mg daily.   - Continue Farxiga 10 - ICD interrogated personally today and reviewed with EP Clinic. No further episodes.   2.  CAD s/p Anterior MI December 2015:  - Episode on 06/14/21 concerning for angina but no recurrence. Given association with ICD therapy we discussed need for ischemic eval and agree to proceed with R/L cath next week - Continue ASA, statin and Effient  - Lipids per Dr. Clelia Croft.   3. ICD therapy - Device interrogation and Zio monitor reviewed. Appears to be SVT. No clear VT or AF - continue b-blocker - keep K > 4.0 Mg > 2.0 - cath  as above  4. Tobacco abuse:  - Remains quit  5. Emphysema - Sees pulmonary. Spirometry on  CPX previously reviewed with only mild disease.     Kyle Meresaniel Nasiah Polinsky, Kyle Bullock  1:14 PM

## 2021-07-18 ENCOUNTER — Ambulatory Visit
Admission: RE | Admit: 2021-07-18 | Discharge: 2021-07-18 | Disposition: A | Discharge status: Home / Self Care | Payer: PPO | Source: Ambulatory Visit | Attending: Internal Medicine | Admitting: Internal Medicine

## 2021-07-18 DIAGNOSIS — I700 Atherosclerosis of aorta: Secondary | ICD-10-CM | POA: Diagnosis not present

## 2021-07-18 DIAGNOSIS — J432 Centrilobular emphysema: Secondary | ICD-10-CM | POA: Diagnosis not present

## 2021-07-18 DIAGNOSIS — F17201 Nicotine dependence, unspecified, in remission: Secondary | ICD-10-CM

## 2021-07-18 DIAGNOSIS — Z87891 Personal history of nicotine dependence: Secondary | ICD-10-CM | POA: Diagnosis not present

## 2021-07-18 DIAGNOSIS — I2510 Atherosclerotic heart disease of native coronary artery without angina pectoris: Secondary | ICD-10-CM | POA: Diagnosis not present

## 2021-07-22 ENCOUNTER — Encounter (HOSPITAL_COMMUNITY): Payer: Self-pay | Admitting: Internal Medicine

## 2021-07-22 ENCOUNTER — Other Ambulatory Visit: Payer: Self-pay

## 2021-07-22 ENCOUNTER — Ambulatory Visit (HOSPITAL_COMMUNITY)
Admission: RE | Admit: 2021-07-22 | Discharge: 2021-07-22 | Disposition: A | Discharge status: Home / Self Care | Payer: PPO | Source: Ambulatory Visit | Attending: Internal Medicine | Admitting: Internal Medicine

## 2021-07-22 ENCOUNTER — Encounter (HOSPITAL_COMMUNITY)
Admission: RE | Disposition: A | Discharge status: Home / Self Care | Payer: Self-pay | Source: Ambulatory Visit | Attending: Internal Medicine

## 2021-07-22 DIAGNOSIS — Z955 Presence of coronary angioplasty implant and graft: Secondary | ICD-10-CM | POA: Insufficient documentation

## 2021-07-22 DIAGNOSIS — Z7982 Long term (current) use of aspirin: Secondary | ICD-10-CM | POA: Diagnosis not present

## 2021-07-22 DIAGNOSIS — Z95810 Presence of automatic (implantable) cardiac defibrillator: Secondary | ICD-10-CM | POA: Insufficient documentation

## 2021-07-22 DIAGNOSIS — I252 Old myocardial infarction: Secondary | ICD-10-CM | POA: Insufficient documentation

## 2021-07-22 DIAGNOSIS — Z8249 Family history of ischemic heart disease and other diseases of the circulatory system: Secondary | ICD-10-CM | POA: Insufficient documentation

## 2021-07-22 DIAGNOSIS — Z888 Allergy status to other drugs, medicaments and biological substances status: Secondary | ICD-10-CM | POA: Insufficient documentation

## 2021-07-22 DIAGNOSIS — Z87891 Personal history of nicotine dependence: Secondary | ICD-10-CM | POA: Insufficient documentation

## 2021-07-22 DIAGNOSIS — Z79899 Other long term (current) drug therapy: Secondary | ICD-10-CM | POA: Diagnosis not present

## 2021-07-22 DIAGNOSIS — I110 Hypertensive heart disease with heart failure: Secondary | ICD-10-CM | POA: Insufficient documentation

## 2021-07-22 DIAGNOSIS — I2510 Atherosclerotic heart disease of native coronary artery without angina pectoris: Secondary | ICD-10-CM | POA: Insufficient documentation

## 2021-07-22 DIAGNOSIS — J439 Emphysema, unspecified: Secondary | ICD-10-CM | POA: Diagnosis not present

## 2021-07-22 DIAGNOSIS — I255 Ischemic cardiomyopathy: Secondary | ICD-10-CM | POA: Diagnosis not present

## 2021-07-22 DIAGNOSIS — Z79890 Hormone replacement therapy (postmenopausal): Secondary | ICD-10-CM | POA: Insufficient documentation

## 2021-07-22 DIAGNOSIS — Z7984 Long term (current) use of oral hypoglycemic drugs: Secondary | ICD-10-CM | POA: Diagnosis not present

## 2021-07-22 DIAGNOSIS — I5022 Chronic systolic (congestive) heart failure: Secondary | ICD-10-CM | POA: Insufficient documentation

## 2021-07-22 HISTORY — PX: RIGHT/LEFT HEART CATH AND CORONARY ANGIOGRAPHY: CATH118266

## 2021-07-22 LAB — POCT I-STAT EG7
Acid-base deficit: 1 mmol/L (ref 0.0–2.0)
Acid-base deficit: 5 mmol/L — ABNORMAL HIGH (ref 0.0–2.0)
Bicarbonate: 19.3 mmol/L — ABNORMAL LOW (ref 20.0–28.0)
Bicarbonate: 24.4 mmol/L (ref 20.0–28.0)
Calcium, Ion: 0.67 mmol/L — CL (ref 1.15–1.40)
Calcium, Ion: 1.07 mmol/L — ABNORMAL LOW (ref 1.15–1.40)
HCT: 31 % — ABNORMAL LOW (ref 39.0–52.0)
HCT: 39 % (ref 39.0–52.0)
Hemoglobin: 10.5 g/dL — ABNORMAL LOW (ref 13.0–17.0)
Hemoglobin: 13.3 g/dL (ref 13.0–17.0)
O2 Saturation: 74 %
O2 Saturation: 74 %
Potassium: 2.7 mmol/L — CL (ref 3.5–5.1)
Potassium: 3.9 mmol/L (ref 3.5–5.1)
Sodium: 142 mmol/L (ref 135–145)
Sodium: 150 mmol/L — ABNORMAL HIGH (ref 135–145)
TCO2: 20 mmol/L — ABNORMAL LOW (ref 22–32)
TCO2: 26 mmol/L (ref 22–32)
pCO2, Ven: 33.9 mmHg — ABNORMAL LOW (ref 44.0–60.0)
pCO2, Ven: 41.8 mmHg — ABNORMAL LOW (ref 44.0–60.0)
pH, Ven: 7.363 (ref 7.250–7.430)
pH, Ven: 7.375 (ref 7.250–7.430)
pO2, Ven: 40 mmHg (ref 32.0–45.0)
pO2, Ven: 40 mmHg (ref 32.0–45.0)

## 2021-07-22 LAB — POCT I-STAT 7, (LYTES, BLD GAS, ICA,H+H)
Acid-base deficit: 1 mmol/L (ref 0.0–2.0)
Bicarbonate: 23.7 mmol/L (ref 20.0–28.0)
Calcium, Ion: 1.09 mmol/L — ABNORMAL LOW (ref 1.15–1.40)
HCT: 39 % (ref 39.0–52.0)
Hemoglobin: 13.3 g/dL (ref 13.0–17.0)
O2 Saturation: 99 %
Potassium: 3.8 mmol/L (ref 3.5–5.1)
Sodium: 141 mmol/L (ref 135–145)
TCO2: 25 mmol/L (ref 22–32)
pCO2 arterial: 39.6 mmHg (ref 32.0–48.0)
pH, Arterial: 7.385 (ref 7.350–7.450)
pO2, Arterial: 117 mmHg — ABNORMAL HIGH (ref 83.0–108.0)

## 2021-07-22 LAB — CARDIAC CATHETERIZATION: Cath EF Quantitative: 25 %

## 2021-07-22 SURGERY — RIGHT/LEFT HEART CATH AND CORONARY ANGIOGRAPHY
Anesthesia: LOCAL

## 2021-07-22 MED ORDER — MIDAZOLAM HCL 2 MG/2ML IJ SOLN
INTRAMUSCULAR | Status: DC | PRN
  Administered 2021-07-22: 2 mg via INTRAVENOUS

## 2021-07-22 MED ORDER — FENTANYL CITRATE (PF) 100 MCG/2ML IJ SOLN
INTRAMUSCULAR | Status: DC | PRN
  Administered 2021-07-22 (×2): 25 ug via INTRAVENOUS

## 2021-07-22 MED ORDER — HEPARIN (PORCINE) IN NACL 1000-0.9 UT/500ML-% IV SOLN
INTRAVENOUS | Status: AC
  Filled 2021-07-22: qty 1000

## 2021-07-22 MED ORDER — SODIUM CHLORIDE 0.9 % IV SOLN: 250.0000 mL | INTRAVENOUS | Status: DC | PRN

## 2021-07-22 MED ORDER — IOHEXOL 350 MG/ML SOLN
INTRAVENOUS | Status: DC | PRN
  Administered 2021-07-22: 75 mL

## 2021-07-22 MED ORDER — SODIUM CHLORIDE 0.9% FLUSH: 3.0000 mL | INTRAVENOUS | Status: DC | PRN

## 2021-07-22 MED ORDER — MIDAZOLAM HCL 2 MG/2ML IJ SOLN
INTRAMUSCULAR | Status: AC
  Filled 2021-07-22: qty 2

## 2021-07-22 MED ORDER — HEPARIN SODIUM (PORCINE) 1000 UNIT/ML IJ SOLN
INTRAMUSCULAR | Status: DC | PRN
  Administered 2021-07-22: 2500 [IU] via INTRAVENOUS

## 2021-07-22 MED ORDER — ONDANSETRON HCL 4 MG/2ML IJ SOLN: 4.0000 mg | Freq: Four times a day (QID) | INTRAMUSCULAR | Status: DC | PRN

## 2021-07-22 MED ORDER — SODIUM CHLORIDE 0.9 % IV SOLN: INTRAVENOUS | Status: DC

## 2021-07-22 MED ORDER — LIDOCAINE HCL (PF) 1 % IJ SOLN
INTRAMUSCULAR | Status: AC
  Filled 2021-07-22: qty 30

## 2021-07-22 MED ORDER — HEPARIN SODIUM (PORCINE) 1000 UNIT/ML IJ SOLN
INTRAMUSCULAR | Status: AC
  Filled 2021-07-22: qty 1

## 2021-07-22 MED ORDER — ACETAMINOPHEN 325 MG PO TABS: 650.0000 mg | ORAL_TABLET | ORAL | Status: DC | PRN

## 2021-07-22 MED ORDER — FENTANYL CITRATE (PF) 100 MCG/2ML IJ SOLN
INTRAMUSCULAR | Status: AC
  Filled 2021-07-22: qty 2

## 2021-07-22 MED ORDER — HEPARIN (PORCINE) IN NACL 1000-0.9 UT/500ML-% IV SOLN
INTRAVENOUS | Status: DC | PRN
  Administered 2021-07-22 (×2): 500 mL

## 2021-07-22 MED ORDER — SODIUM CHLORIDE 0.9% FLUSH: 3.0000 mL | Freq: Two times a day (BID) | INTRAVENOUS | Status: DC

## 2021-07-22 MED ORDER — HYDRALAZINE HCL 20 MG/ML IJ SOLN: 10.0000 mg | INTRAMUSCULAR | Status: DC | PRN

## 2021-07-22 MED ORDER — VERAPAMIL HCL 2.5 MG/ML IV SOLN
INTRAVENOUS | Status: DC | PRN
  Administered 2021-07-22: 10 mL via INTRA_ARTERIAL

## 2021-07-22 MED ORDER — LIDOCAINE HCL (PF) 1 % IJ SOLN
INTRAMUSCULAR | Status: DC | PRN
  Administered 2021-07-22 (×2): 2 mL

## 2021-07-22 MED ORDER — VERAPAMIL HCL 2.5 MG/ML IV SOLN
INTRAVENOUS | Status: AC
  Filled 2021-07-22: qty 2

## 2021-07-22 MED ORDER — LABETALOL HCL 5 MG/ML IV SOLN: 10.0000 mg | INTRAVENOUS | Status: DC | PRN

## 2021-07-22 SURGICAL SUPPLY — 10 items
CATH 5FR JL3.5 JR4 ANG PIG MP (CATHETERS) ×1 IMPLANT
CATH BALLN WEDGE 5F 110CM (CATHETERS) ×1 IMPLANT
CATH INFINITI 5 FR 3DRC (CATHETERS) ×1 IMPLANT
DEVICE RAD COMP TR BAND LRG (VASCULAR PRODUCTS) ×1 IMPLANT
GLIDESHEATH SLEND SS 6F .021 (SHEATH) ×1 IMPLANT
GUIDEWIRE INQWIRE 1.5J.035X260 (WIRE) IMPLANT
INQWIRE 1.5J .035X260CM (WIRE) ×2
PACK CARDIAC CATHETERIZATION (CUSTOM PROCEDURE TRAY) ×2 IMPLANT
SHEATH GLIDE SLENDER 4/5FR (SHEATH) ×1 IMPLANT
TRANSDUCER W/STOPCOCK (MISCELLANEOUS) ×2 IMPLANT

## 2021-07-22 NOTE — Interval H&P Note (Signed)
History and Physical Interval Note:  07/22/2021 2:35 PM  Kyle Bullock  has presented today for surgery, with the diagnosis of heart failure.  The various methods of treatment have been discussed with the patient and family. After consideration of risks, benefits and other options for treatment, the patient has consented to  Procedure(s): RIGHT/LEFT HEART CATH AND CORONARY ANGIOGRAPHY (N/A) and possible coronary angioplasty as a surgical intervention.  The patient's history has been reviewed, patient examined, no change in status, stable for surgery.  I have reviewed the patient's chart and labs.  Questions were answered to the patient's satisfaction.     [name]

## 2021-08-01 ENCOUNTER — Telehealth: Payer: Self-pay

## 2021-08-01 NOTE — Telephone Encounter (Signed)
Merlin alert- There was one NSVT arrhythmia and one VT arrhythmia that was successfully converted with one burst of ATP  Pt has history of the same.  Most recent episode 06/20/21.  Pt seen in f/u on 8/8 and referred to CHF clinic.    Current meds include: Metoprolol 25mg  daily, Entresto 24-26mg  BID  Attempted to reach pt to discuss.  No answer, LVM with device clinic # and hours to return call.    Pt already on DMV driving restriction, will need to advise time resets with this episode.

## 2021-08-02 NOTE — Telephone Encounter (Signed)
Pt left a message returning nurse phone call. His number is [phone].

## 2021-08-02 NOTE — Telephone Encounter (Signed)
Patient returning phone call.  Patient reports he was carrying a heavy box and felt lightheaded and had to sit down. Reports improvements after he sat down within 5 minutes. States he had no issues for the rest of the evening. Reports compliance with medications on file.   NCDMV restrictions x6 months discussed with patient. Shock plan reviewed with patient. Verbalized understanding.   Advised patient I will forward to Dr. Johney Frame and we will call with any changes.

## 2021-08-02 NOTE — Telephone Encounter (Signed)
Attempted to contact patient. No answer,LMTCB 

## 2021-08-07 DIAGNOSIS — E785 Hyperlipidemia, unspecified: Secondary | ICD-10-CM | POA: Diagnosis not present

## 2021-08-07 DIAGNOSIS — I5022 Chronic systolic (congestive) heart failure: Secondary | ICD-10-CM | POA: Diagnosis not present

## 2021-08-07 DIAGNOSIS — N1831 Chronic kidney disease, stage 3a: Secondary | ICD-10-CM | POA: Diagnosis not present

## 2021-08-07 DIAGNOSIS — I130 Hypertensive heart and chronic kidney disease with heart failure and stage 1 through stage 4 chronic kidney disease, or unspecified chronic kidney disease: Secondary | ICD-10-CM | POA: Diagnosis not present

## 2021-08-12 ENCOUNTER — Other Ambulatory Visit (HOSPITAL_COMMUNITY): Payer: Self-pay | Admitting: Internal Medicine

## 2021-08-26 ENCOUNTER — Encounter: Payer: Self-pay | Admitting: Student

## 2021-08-26 ENCOUNTER — Other Ambulatory Visit: Payer: Self-pay

## 2021-08-26 ENCOUNTER — Ambulatory Visit (INDEPENDENT_AMBULATORY_CARE_PROVIDER_SITE_OTHER): Payer: PPO | Admitting: Student

## 2021-08-26 VITALS — BP 90/68 | HR 69 | Ht 75.0 in | Wt 209.6 lb

## 2021-08-26 DIAGNOSIS — I2510 Atherosclerotic heart disease of native coronary artery without angina pectoris: Secondary | ICD-10-CM | POA: Diagnosis not present

## 2021-08-26 DIAGNOSIS — I5022 Chronic systolic (congestive) heart failure: Secondary | ICD-10-CM

## 2021-08-26 DIAGNOSIS — I10 Essential (primary) hypertension: Secondary | ICD-10-CM

## 2021-08-26 DIAGNOSIS — I255 Ischemic cardiomyopathy: Secondary | ICD-10-CM | POA: Diagnosis not present

## 2021-08-26 DIAGNOSIS — I471 Supraventricular tachycardia, unspecified: Secondary | ICD-10-CM

## 2021-08-26 DIAGNOSIS — Z4502 Encounter for adjustment and management of automatic implantable cardiac defibrillator: Secondary | ICD-10-CM

## 2021-08-26 DIAGNOSIS — H903 Sensorineural hearing loss, bilateral: Secondary | ICD-10-CM | POA: Diagnosis not present

## 2021-08-26 DIAGNOSIS — E039 Hypothyroidism, unspecified: Secondary | ICD-10-CM | POA: Diagnosis not present

## 2021-08-26 LAB — CUP PACEART INCLINIC DEVICE CHECK
Battery Remaining Longevity: 51 mo
Brady Statistic RV Percent Paced: 0.28 %
Date Time Interrogation Session: 20220919105158
HighPow Impedance: 69.75 Ohm
Implantable Lead Implant Date: 20160323
Implantable Lead Location: 753860
Implantable Pulse Generator Implant Date: 20160323
Lead Channel Impedance Value: 362.5 Ohm
Lead Channel Pacing Threshold Amplitude: 0.75 V
Lead Channel Pacing Threshold Amplitude: 0.75 V
Lead Channel Pacing Threshold Pulse Width: 0.5 ms
Lead Channel Pacing Threshold Pulse Width: 0.5 ms
Lead Channel Sensing Intrinsic Amplitude: 11.9 mV
Lead Channel Setting Pacing Amplitude: 2.5 V
Lead Channel Setting Pacing Pulse Width: 0.5 ms
Lead Channel Setting Sensing Sensitivity: 0.5 mV
Pulse Gen Serial Number: 7255600

## 2021-08-26 LAB — BASIC METABOLIC PANEL
BUN/Creatinine Ratio: 14 (ref 10–24)
BUN: 15 mg/dL (ref 8–27)
CO2: 23 mmol/L (ref 20–29)
Calcium: 9.1 mg/dL (ref 8.6–10.2)
Chloride: 103 mmol/L (ref 96–106)
Creatinine, Ser: 1.06 mg/dL (ref 0.76–1.27)
Glucose: 93 mg/dL (ref 65–99)
Potassium: 4.6 mmol/L (ref 3.5–5.2)
Sodium: 139 mmol/L (ref 134–144)
eGFR: 75 mL/min/{1.73_m2} (ref >59)

## 2021-08-26 LAB — MAGNESIUM: Magnesium: 2.1 mg/dL (ref 1.6–2.3)

## 2021-08-26 NOTE — Patient Instructions (Signed)
Medication Instructions:  Your physician recommends that you continue on your current medications as directed. Please refer to the Current Medication list given to you today.  *If you need a refill on your cardiac medications before your next appointment, please call your pharmacy*   Lab Work: TODAY: BMET, Mg  If you have labs (blood work) drawn today and your tests are completely normal, you will receive your results only by: MyChart Message (if you have MyChart) OR A paper copy in the mail If you have any lab test that is abnormal or we need to change your treatment, we will call you to review the results.   Follow-Up: At [facility], you and your health needs are our priority.  As part of our continuing mission to provide you with exceptional heart care, we have created designated Provider Care Teams.  These Care Teams include your primary Cardiologist (physician) and Advanced Practice Providers (APPs -  Physician Assistants and Nurse Practitioners) who all work together to provide you with the care you need, when you need it.   Your next appointment:   As scheduled  

## 2021-08-26 NOTE — Progress Notes (Signed)
Electrophysiology Office Note Date: 08/26/2021  ID:  Kyle Bullock, DOB 1951-09-29, MRN [id number]  PCP: Cleatis Polka., MD Primary Cardiologist: None Electrophysiologist: Hillis Range, MD   CC: Routine ICD follow-up  Kyle Bullock is a 70 y.o. male seen today for Hillis Range, MD for routine electrophysiology followup.  Since last being seen in our clinic the patient reports doing OK. He lost his mother in law last month, and had COVID around the same time. This was also notable for an episode of VT terminated by ATP during her funeral. Otherwise he has been doing well. Denies chest discomfort or undue SOB. Denies edema. Remains very active. Had multiple episodes of HVR on 8/29 and he thinks it may have been while stacking several loads of wood.   Device History: St. Jude Single Chamber ICD implanted 2016 for chronic systolic CHF / ICM  Past Medical History:  Diagnosis Date   Abnormal TSH    a. 11/2014: felt d/t sick euthyroid.   AICD (automatic cardioverter/defibrillator) present 02/28/2015   CAD (coronary artery disease)    a. 11/2014: arrest/STEMI s/p Xience DES to the LAD and PTCA to the D1 [employer], [place]). b. Relook cath 11/2014: no culprit, patent stent.   CHF (congestive heart failure) (HCC)    Dressler's syndrome (HCC)    a. 11/2014: tx with colchicine and prednisone.   Dyslipidemia    Dyspnea    a. 11/2014: possibly due to combo of CHF and Brilinta. Brilinta changed to Effient.   Erectile dysfunction    a. Pt aware not to take ED med within 24 hr of NTG and vice versa.   Hypertension    Ischemic cardiomyopathy    a. 11/2014: EF reportedly 20% by cath and echo in [place].   NSVT (nonsustained ventricular tachycardia) (HCC) 11/16/14   a. 11/2014: admitted after discharge from STEMI admission, 18 beats 135-140bpm in ED.   Pericardial effusion    a. 11/2014.   Pericarditis dx'd 11/2014   Pneumonia 1990's X 2   RBBB    Sleep apnea    suspected but not  diagnosed (02/28/2015)   STEMI (ST elevation myocardial infarction) (HCC) 11/10/2014   Tobacco abuse    VF (ventricular fibrillation) (HCC) 11/10/14   a. 11/2014: arrest with STEMI.   Past Surgical History:  Procedure Laterality Date   A-FLUTTER ABLATION N/A 09/04/2020   Procedure: A-FLUTTER ABLATION;  Surgeon: Hillis Range, MD;  [facility]: [facility];  Service: Cardiovascular;  Laterality: N/A;   CARDIAC CATHETERIZATION  11/19/2014   CARDIAC DEFIBRILLATOR PLACEMENT  02/28/2015   CORONARY ANGIOPLASTY WITH STENT PLACEMENT  11/10/2014   LAD DES   HAND SURGERY Bilateral    Dupuytren's Contracture   IMPLANTABLE CARDIOVERTER DEFIBRILLATOR IMPLANT N/A 02/28/2015   SJM Fortify Assura VR ICD implanted by Dr Johney Frame.  Revascularized following VF arrest, EF remained depressed and ICD implanted   KNEE ARTHROSCOPY Right 1982   LEFT HEART CATH N/A 11/19/2014   Procedure: LEFT HEART CATH;  Surgeon: Runell Gess, MD;  Location: [facility] CATH LAB;  Service: Cardiovascular;  Laterality: N/A;   RIGHT/LEFT HEART CATH AND CORONARY ANGIOGRAPHY N/A 07/22/2021   Procedure: RIGHT/LEFT HEART CATH AND CORONARY ANGIOGRAPHY;  Surgeon: Dolores Patty, MD;  Location: [facility];  Service: Cardiovascular;  Laterality: N/A;    Current Outpatient Medications  Medication Sig Dispense Refill   acetaminophen (TYLENOL) 500 MG tablet Take 500 mg by mouth every 6 (six) hours as needed for moderate  pain.     ANORO ELLIPTA 62.5-25 MCG/INH AEPB Inhale 1 puff into the lungs in the morning.  11   aspirin EC 81 MG tablet Take 1 tablet (81 mg total) by mouth daily. Swallow whole. 90 tablet 3   atorvastatin (LIPITOR) 20 MG tablet TAKE 1 TABLET BY MOUTH DAILY AT 6 PM 90 tablet 3   colchicine 0.6 MG tablet Take 0.6 mg by mouth daily as needed (gout flare).      empagliflozin (JARDIANCE) 10 MG TABS tablet Take 1 tablet (10 mg total) by mouth daily before breakfast. 30 tablet 11   ENTRESTO 24-26 MG TAKE 1 TABLET BY MOUTH  TWICE DAILY 180 tablet 0   furosemide (LASIX) 20 MG tablet Take 20 mg by mouth as needed.     levothyroxine (SYNTHROID) 75 MCG tablet Take 75 mcg by mouth daily before breakfast.     metoprolol succinate (TOPROL XL) 25 MG 24 hr tablet Take 1 tablet (25 mg total) by mouth at bedtime. 90 tablet 3   nitroGLYCERIN (NITROSTAT) 0.4 MG SL tablet Place 1 tablet (0.4 mg total) under the tongue every 5 (five) minutes as needed for chest pain (MAX 3 TABLETS). 10 tablet 3   spironolactone (ALDACTONE) 25 MG tablet Take 0.5 tablets (12.5 mg total) by mouth daily. 45 tablet 3   triamcinolone cream (KENALOG) 0.5 % Apply 1 application topically 2 (two) times daily as needed (skin irritation/rash).     VASCEPA 1 g capsule Take 2 g by mouth 2 (two) times daily.      No current facility-administered medications for this visit.    Allergies:   Ambien [zolpidem tartrate], Lisinopril, and Brilinta [ticagrelor]   Social History: Social History   Socioeconomic History   Marital status: Married    Spouse name: Not on file   Number of children: 2   Years of education: Not on file   Highest education level: Not on file  Occupational History   Not on file  Tobacco Use   Smoking status: Former    Packs/day: 2.00    Years: 40.00    Pack years: 80.00    Types: Cigarettes    Quit date: 11/10/2014    Years since quitting: 6.7   Smokeless tobacco: Never  Vaping Use   Vaping Use: Never used  Substance and Sexual Activity   Alcohol use: Yes    Alcohol/week: 14.0 standard drinks    Types: 14 Glasses of wine per week    Comment: 02/28/2015 "2, 4oz glasses of wine/night"   Drug use: No   Sexual activity: Not Currently  Other Topics Concern   Not on file  Social History Narrative   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  Attended [employer].  Now lives in [place].   Social Determinants of Health   Financial Resource Strain: Not on file  Food Insecurity: Not on file  Transportation Needs: Not on file   Physical Activity: Not on file  Stress: Not on file  Social Connections: Not on file  Intimate Partner Violence: Not on file    Family History: Family History  Problem Relation Age of Onset   COPD Mother    Hypertension Mother    Hypertension Father    CAD Neg Hx     Review of Systems: All other systems reviewed and are otherwise negative except as noted above.   Physical Exam: Vitals:   08/26/21 1027  BP: 90/68  Pulse: 69  SpO2: 96%  Weight: 209 lb 9.6  oz (95.1 kg)  Height: 6\' 3"  (1.905 m)     GEN- The patient is well appearing, alert and oriented x 3 today.   HEENT: normocephalic, atraumatic; sclera clear, conjunctiva pink; hearing intact; oropharynx clear; neck supple, no JVP Lymph- no cervical lymphadenopathy Lungs- Clear to ausculation bilaterally, normal work of breathing.  No wheezes, rales, rhonchi Heart- Regular rate and rhythm, no murmurs, rubs or gallops, PMI not laterally displaced GI- soft, non-tender, non-distended, bowel sounds present, no hepatosplenomegaly Extremities- no clubbing or cyanosis. No edema; DP/PT/radial pulses 2+ bilaterally MS- no significant deformity or atrophy Skin- warm and dry, no rash or lesion; ICD pocket well healed Psych- euthymic mood, full affect Neuro- strength and sensation are intact  ICD interrogation- reviewed in detail today,  See PACEART report  EKG:  EKG is not ordered today.  Recent Labs: 07/15/2021: ALT 16; Magnesium 2.5; TSH 3.120 07/17/2021: BUN 20; Creatinine, Ser 1.10; Platelets 306 07/22/2021: Hemoglobin 10.5; Potassium 2.7; Sodium 150   Wt Readings from Last 3 Encounters:  08/26/21 209 lb 9.6 oz (95.1 kg)  07/22/21 205 lb (93 kg)  07/17/21 206 lb 6.4 oz (93.6 kg)     Other studies Reviewed: Additional studies/ records that were reviewed today include: Previous EP office notes.   Assessment and Plan:  1.  Chronic systolic dysfunction s/p St. Jude single chamber ICD  euvolemic today Stable on an  appropriate medical regimen Normal ICD function See Pace Art report No changes today  2. CAD L./RHC 07/22/2021 with known CARD with widely patent LAD stent. Non-obstructive CAD in RCA and LCX with ~60% lesion through mRCA.    3. Atrial flutter s/p CTI ablation by Dr. 07/24/2021 09/04/20 Eliquis stopped 10/18/2020 with no recurrence  Zio patch with short SVT, longest 13.3 seconds. No fib noted by preliminary report    4. VT Regular tachy rhythm requiring ATP concerning for VT, though confounded in the setting of irregular tachy rhythms on 7/8 and 7/14. Optimize BB as tolerated. His BP is low today.  No progression of CAD on recent cath.  Have reviewed NCDMV guidelines with patient of no driving x 6 month after appropriate device therapy. Would consider Sotalol vs amiodarone if continues despite BB optimization or if unable to tolerate up-titration. Would lean towards sotalol first given young age.    Current medicines are reviewed at length with the patient today.    Labs/ tests ordered today include:  Orders Placed This Encounter  Procedures   Basic metabolic panel   Magnesium   CUP PACEART INCLINIC DEVICE CHECK   Disposition:   Follow up with Dr. 8/14 as scheduled at patient request and with recurrent VT.    Johney Frame, PA-C  08/26/2021 11:21 AM  [facility] [address] [place] [phone] (office) [phone] (fax)

## 2021-09-03 ENCOUNTER — Ambulatory Visit (INDEPENDENT_AMBULATORY_CARE_PROVIDER_SITE_OTHER): Payer: PPO

## 2021-09-03 DIAGNOSIS — I255 Ischemic cardiomyopathy: Secondary | ICD-10-CM

## 2021-09-03 LAB — CUP PACEART REMOTE DEVICE CHECK
Battery Remaining Longevity: 49 mo
Battery Remaining Percentage: 46 %
Battery Voltage: 2.98 V
Brady Statistic RV Percent Paced: 1 %
Date Time Interrogation Session: 20220927020015
HighPow Impedance: 72 Ohm
HighPow Impedance: 72 Ohm
Implantable Lead Implant Date: 20160323
Implantable Lead Location: 753860
Implantable Pulse Generator Implant Date: 20160323
Lead Channel Impedance Value: 390 Ohm
Lead Channel Pacing Threshold Amplitude: 0.75 V
Lead Channel Pacing Threshold Pulse Width: 0.5 ms
Lead Channel Sensing Intrinsic Amplitude: 12 mV
Lead Channel Setting Pacing Amplitude: 2.5 V
Lead Channel Setting Pacing Pulse Width: 0.5 ms
Lead Channel Setting Sensing Sensitivity: 0.5 mV
Pulse Gen Serial Number: 7255600

## 2021-09-11 NOTE — Progress Notes (Signed)
Remote ICD transmission.   

## 2021-09-14 DIAGNOSIS — Z23 Encounter for immunization: Secondary | ICD-10-CM | POA: Diagnosis not present

## 2021-09-24 ENCOUNTER — Other Ambulatory Visit (HOSPITAL_COMMUNITY): Payer: Self-pay

## 2021-09-24 MED ORDER — EMPAGLIFLOZIN 10 MG PO TABS: 10.0000 mg | ORAL_TABLET | Freq: Every day | ORAL | 3 refills | Status: DC

## 2021-09-25 ENCOUNTER — Encounter (HOSPITAL_COMMUNITY): Payer: PPO | Admitting: Internal Medicine

## 2021-10-18 ENCOUNTER — Ambulatory Visit: Payer: PPO | Admitting: Internal Medicine

## 2021-10-18 ENCOUNTER — Other Ambulatory Visit: Payer: Self-pay

## 2021-10-18 VITALS — BP 94/60 | HR 54 | Ht 75.0 in | Wt 210.2 lb

## 2021-10-18 DIAGNOSIS — I5022 Chronic systolic (congestive) heart failure: Secondary | ICD-10-CM | POA: Diagnosis not present

## 2021-10-18 DIAGNOSIS — I10 Essential (primary) hypertension: Secondary | ICD-10-CM | POA: Diagnosis not present

## 2021-10-18 DIAGNOSIS — I4901 Ventricular fibrillation: Secondary | ICD-10-CM | POA: Diagnosis not present

## 2021-10-18 DIAGNOSIS — I255 Ischemic cardiomyopathy: Secondary | ICD-10-CM

## 2021-10-18 DIAGNOSIS — I4892 Unspecified atrial flutter: Secondary | ICD-10-CM | POA: Diagnosis not present

## 2021-10-18 DIAGNOSIS — I2510 Atherosclerotic heart disease of native coronary artery without angina pectoris: Secondary | ICD-10-CM

## 2021-10-18 NOTE — Patient Instructions (Addendum)
Medication Instructions:  Your physician recommends that you continue on your current medications as directed. Please refer to the Current Medication list given to you today. *If you need a refill on your cardiac medications before your next appointment, please call your pharmacy*  Lab Work: None. If you have labs (blood work) drawn today and your tests are completely normal, you will receive your results only by: MyChart Message (if you have MyChart) OR A paper copy in the mail If you have any lab test that is abnormal or we need to change your treatment, we will call you to review the results.  Testing/Procedures: None.  Follow-Up: At [facility], you and your health needs are our priority.  As part of our continuing mission to provide you with exceptional heart care, we have created designated Provider Care Teams.  These Care Teams include your primary Cardiologist (physician) and Advanced Practice Providers (APPs -  Physician Assistants and Nurse Practitioners) who all work together to provide you with the care you need, when you need it.  Your physician wants you to follow-up in:  6 months with  one of the following Advanced Practice Providers on your designated Care Team:     Casimiro Needle "Mardelle Matte" [place]anna [place], [place]   You will receive a reminder letter in the mail two months in advance. If you don't receive a letter, please call our office to schedule the follow-up appointment.  Remote monitoring is used to monitor your ICD from home. This monitoring reduces the number of office visits required to check your device to one time per year. It allows [place] to keep an eye on the functioning of your device to ensure it is working properly. You are scheduled for a device check from home on 12/03/21. You may send your transmission at any time that day. If you have a wireless device, the transmission will be sent automatically. After your physician reviews your transmission, you will receive a postcard with  your next transmission date.  We recommend signing up for the patient portal called "MyChart".  Sign up information is provided on this After Visit Summary.  MyChart is used to connect with patients for Virtual Visits (Telemedicine).  Patients are able to view lab/test results, encounter notes, upcoming appointments, etc.  Non-urgent messages can be sent to your provider as well.   To learn more about what you can do with MyChart, go to [email or web address].    Any Other Special Instructions Will Be Listed Below (If Applicable).

## 2021-10-18 NOTE — Progress Notes (Signed)
PCP: Ginger Organ., MD Primary Cardiologist: Dr Haroldine Laws Primary EP: Dr Rayann Heman  [name] is a 70 y.o. male who presents today for routine electrophysiology followup.  Since last being seen in our clinic, the patient reports doing reasonably well.  He had VT in July with ATP delivered.  No sustained arrhythmias since that time.  He was evaluated by Dr Haroldine Laws 07/17/21 (his note and cath reviewed) for SSCP.  This has resolved. He remains active. Today, he denies symptoms of palpitations, chest pain, shortness of breath,  lower extremity edema, dizziness, presyncope, syncope, or ICD shocks.  The patient is otherwise without complaint today.   Past Medical History:  Diagnosis Date   Abnormal TSH    a. 11/2014: felt d/t sick euthyroid.   AICD (automatic cardioverter/defibrillator) present 02/28/2015   CAD (coronary artery disease)    a. 11/2014: arrest/STEMI s/p Xience DES to the LAD and PTCA to the D1 ([place]). b. Relook cath 11/2014: no culprit, patent stent.   CHF (congestive heart failure) ([place])    Dressler's syndrome ([place])    a. 11/2014: tx with colchicine and prednisone.   Dyslipidemia    Dyspnea    a. 11/2014: possibly due to combo of CHF and Brilinta. Brilinta changed to Effient.   Erectile dysfunction    a. Pt aware not to take ED med within 24 hr of NTG and vice versa.   Hypertension    Ischemic cardiomyopathy    a. 11/2014: EF reportedly 20% by cath and echo in [place].   NSVT (nonsustained ventricular tachycardia) ([place]) 11/16/14   a. 11/2014: admitted after discharge from STEMI admission, 18 beats 135-140bpm in ED.   Pericardial effusion    a. 11/2014.   Pericarditis dx'd 11/2014   Pneumonia 1990's X 2   RBBB    Sleep apnea    suspected but not diagnosed (02/28/2015)   STEMI (ST elevation myocardial infarction) ([place]) 11/10/2014   Tobacco abuse    VF (ventricular fibrillation) ([place]) 11/10/14   a. 11/2014: arrest with STEMI.   Past Surgical  History:  Procedure Laterality Date   A-FLUTTER ABLATION N/A 09/04/2020   Procedure: A-FLUTTER ABLATION;  Surgeon: Thompson Grayer, MD;  Location: [facility] CV [facility];  Service: Cardiovascular;  Laterality: N/A;   CARDIAC CATHETERIZATION  11/19/2014   CARDIAC DEFIBRILLATOR PLACEMENT  02/28/2015   CORONARY ANGIOPLASTY WITH STENT PLACEMENT  11/10/2014   LAD DES   HAND SURGERY Bilateral    Dupuytren's Contracture   IMPLANTABLE CARDIOVERTER DEFIBRILLATOR IMPLANT N/A 02/28/2015   SJM Fortify Assura VR ICD implanted by Dr Rayann Heman.  Revascularized following VF arrest, EF remained depressed and ICD implanted   KNEE ARTHROSCOPY Right 1982   LEFT HEART CATH N/A 11/19/2014   Procedure: LEFT HEART CATH;  Surgeon: Lorretta Harp, MD;  Location: [facility] C[facility];  Service: Cardiovascular;  Laterality: N/A;   RIGHT/LEFT HEART CATH AND CORONARY ANGIOGRAPHY N/A 07/22/2021   Procedure: RIGHT/LEFT HEART CATH AND CORONARY ANGIOGRAPHY;  Surgeon: Jolaine Artist, MD;  Location: [facility] CV LAB;  Service: Cardiovascular;  Laterality: N/A;    ROS- all systems are reviewed and negative except as per HPI above  Current Outpatient Medications  Medication Sig Dispense Refill   acetaminophen (TYLENOL) 500 MG tablet Take 500 mg by mouth every 6 (six) hours as needed for moderate pain.     ANORO ELLIPTA 62.5-25 MCG/INH AEPB Inhale 1 puff into the lungs in the morning.  11   aspirin EC 81 MG  tablet Take 1 tablet (81 mg total) by mouth daily. Swallow whole. 90 tablet 3   atorvastatin (LIPITOR) 20 MG tablet TAKE 1 TABLET BY MOUTH DAILY AT 6 PM 90 tablet 3   colchicine 0.6 MG tablet Take 0.6 mg by mouth daily as needed (gout flare).      empagliflozin (JARDIANCE) 10 MG TABS tablet Take 1 tablet (10 mg total) by mouth daily before breakfast. 90 tablet 3   ENTRESTO 24-26 MG TAKE 1 TABLET BY MOUTH TWICE DAILY 180 tablet 0   furosemide (LASIX) 20 MG tablet Take 20 mg by mouth as needed.     levothyroxine (SYNTHROID) 75 MCG  tablet Take 75 mcg by mouth daily before breakfast.     metoprolol succinate (TOPROL XL) 25 MG 24 hr tablet Take 1 tablet (25 mg total) by mouth at bedtime. 90 tablet 3   nitroGLYCERIN (NITROSTAT) 0.4 MG SL tablet Place 1 tablet (0.4 mg total) under the tongue every 5 (five) minutes as needed for chest pain (MAX 3 TABLETS). 10 tablet 3   spironolactone (ALDACTONE) 25 MG tablet Take 0.5 tablets (12.5 mg total) by mouth daily. 45 tablet 3   triamcinolone cream (KENALOG) 0.5 % Apply 1 application topically 2 (two) times daily as needed (skin irritation/rash).     VASCEPA 1 g capsule Take 2 g by mouth 2 (two) times daily.      No current facility-administered medications for this visit.    Physical Exam: Vitals:   10/18/21 1156  BP: 94/60  Pulse: (!) 54  SpO2: 94%  Weight: 210 lb 3.2 oz (95.3 kg)  Height: 6\' 3"  (1.905 m)    GEN- The patient is well appearing, alert and oriented x 3 today.   Head- normocephalic, atraumatic Eyes-  Sclera clear, conjunctiva pink Ears- hearing intact Oropharynx- clear Lungs- Clear to ausculation bilaterally, normal work of breathing Chest- ICD pocket is well healed Heart- Regular rate and rhythm, no murmurs, rubs or gallops, PMI not laterally displaced GI- soft, NT, ND, + BS Extremities- no clubbing, cyanosis, or edema  ICD interrogation- reviewed in detail today,  See PACEART report  ekg tracing ordered today is personally reviewed and shows sinus bradycardia, first degree AV block, RBBB  Wt Readings from Last 3 Encounters:  10/18/21 210 lb 3.2 oz (95.3 kg)  08/26/21 209 lb 9.6 oz (95.1 kg)  07/22/21 205 lb (93 kg)    Assessment and Plan:  1.  Chronic systolic dysfunction/ ischemic CM/ CAD/ prior VF arrest euvolemic today Stable on an appropriate medical regimen Normal ICD function See 07/24/21 Art report No changes today he is not device dependant today Cath 07/22/21 reviewed.  Medical therapy advised Labs 08/26/21 reviewed  2. Atrial  flutter S/p ablation Zio patch showed short NSVT (longest 13 seconds) but no afib Nonsustained events on ICD reviewed.  Morphology suggests SVT. We have stopped OAC as we have not seen definitive afib.  3. HL Continue lipitor  Risks, benefits and potential toxicities for medications prescribed and/or refilled reviewed with patient today.   Follow-up with EP APP in 6 months  08/28/21 MD, [facility] 10/18/2021 12:01 PM

## 2021-10-21 LAB — CUP PACEART INCLINIC DEVICE CHECK
Battery Remaining Longevity: 50 mo
Brady Statistic RV Percent Paced: 0.43 %
Date Time Interrogation Session: 20221111125200
HighPow Impedance: 72 Ohm
Implantable Lead Implant Date: 20160323
Implantable Lead Location: 753860
Implantable Pulse Generator Implant Date: 20160323
Lead Channel Impedance Value: 375 Ohm
Lead Channel Pacing Threshold Amplitude: 0.75 V
Lead Channel Pacing Threshold Pulse Width: 0.5 ms
Lead Channel Sensing Intrinsic Amplitude: 12 mV
Lead Channel Setting Pacing Amplitude: 2.5 V
Lead Channel Setting Pacing Pulse Width: 0.5 ms
Lead Channel Setting Sensing Sensitivity: 0.5 mV
Pulse Gen Serial Number: 7255600

## 2021-11-06 DIAGNOSIS — I5022 Chronic systolic (congestive) heart failure: Secondary | ICD-10-CM | POA: Diagnosis not present

## 2021-11-06 DIAGNOSIS — I130 Hypertensive heart and chronic kidney disease with heart failure and stage 1 through stage 4 chronic kidney disease, or unspecified chronic kidney disease: Secondary | ICD-10-CM | POA: Diagnosis not present

## 2021-11-06 DIAGNOSIS — E785 Hyperlipidemia, unspecified: Secondary | ICD-10-CM | POA: Diagnosis not present

## 2021-11-06 DIAGNOSIS — N1831 Chronic kidney disease, stage 3a: Secondary | ICD-10-CM | POA: Diagnosis not present

## 2021-11-07 ENCOUNTER — Encounter (HOSPITAL_COMMUNITY): Payer: Self-pay | Admitting: Internal Medicine

## 2021-11-07 ENCOUNTER — Ambulatory Visit (HOSPITAL_COMMUNITY)
Admission: RE | Admit: 2021-11-07 | Discharge: 2021-11-07 | Disposition: A | Discharge status: Home / Self Care | Payer: PPO | Source: Ambulatory Visit | Attending: Internal Medicine | Admitting: Internal Medicine

## 2021-11-07 VITALS — BP 92/64 | HR 64 | Wt 209.0 lb

## 2021-11-07 DIAGNOSIS — I252 Old myocardial infarction: Secondary | ICD-10-CM | POA: Insufficient documentation

## 2021-11-07 DIAGNOSIS — I255 Ischemic cardiomyopathy: Secondary | ICD-10-CM | POA: Diagnosis not present

## 2021-11-07 DIAGNOSIS — Z7982 Long term (current) use of aspirin: Secondary | ICD-10-CM | POA: Diagnosis not present

## 2021-11-07 DIAGNOSIS — Z95810 Presence of automatic (implantable) cardiac defibrillator: Secondary | ICD-10-CM | POA: Diagnosis not present

## 2021-11-07 DIAGNOSIS — I5022 Chronic systolic (congestive) heart failure: Secondary | ICD-10-CM | POA: Diagnosis not present

## 2021-11-07 DIAGNOSIS — Z7902 Long term (current) use of antithrombotics/antiplatelets: Secondary | ICD-10-CM | POA: Diagnosis not present

## 2021-11-07 DIAGNOSIS — R42 Dizziness and giddiness: Secondary | ICD-10-CM | POA: Diagnosis not present

## 2021-11-07 DIAGNOSIS — Z955 Presence of coronary angioplasty implant and graft: Secondary | ICD-10-CM | POA: Diagnosis not present

## 2021-11-07 DIAGNOSIS — Z87891 Personal history of nicotine dependence: Secondary | ICD-10-CM | POA: Insufficient documentation

## 2021-11-07 DIAGNOSIS — I4901 Ventricular fibrillation: Secondary | ICD-10-CM

## 2021-11-07 DIAGNOSIS — I2510 Atherosclerotic heart disease of native coronary artery without angina pectoris: Secondary | ICD-10-CM | POA: Diagnosis not present

## 2021-11-07 DIAGNOSIS — I241 Dressler's syndrome: Secondary | ICD-10-CM | POA: Diagnosis not present

## 2021-11-07 DIAGNOSIS — J439 Emphysema, unspecified: Secondary | ICD-10-CM | POA: Insufficient documentation

## 2021-11-07 DIAGNOSIS — I519 Heart disease, unspecified: Secondary | ICD-10-CM

## 2021-11-07 DIAGNOSIS — I959 Hypotension, unspecified: Secondary | ICD-10-CM | POA: Insufficient documentation

## 2021-11-07 DIAGNOSIS — I110 Hypertensive heart disease with heart failure: Secondary | ICD-10-CM | POA: Diagnosis not present

## 2021-11-07 DIAGNOSIS — E785 Hyperlipidemia, unspecified: Secondary | ICD-10-CM

## 2021-11-07 DIAGNOSIS — Z7984 Long term (current) use of oral hypoglycemic drugs: Secondary | ICD-10-CM | POA: Diagnosis not present

## 2021-11-07 DIAGNOSIS — Z79899 Other long term (current) drug therapy: Secondary | ICD-10-CM | POA: Diagnosis not present

## 2021-11-07 LAB — LIPID PANEL
Cholesterol: 127 mg/dL (ref 0–200)
HDL: 51 mg/dL (ref >40)
LDL Cholesterol: 42 mg/dL (ref 0–99)
Total CHOL/HDL Ratio: 2.5 RATIO
Triglycerides: 170 mg/dL — ABNORMAL HIGH (ref <150)
VLDL: 34 mg/dL (ref 0–40)

## 2021-11-07 LAB — COMPREHENSIVE METABOLIC PANEL
ALT: 21 U/L (ref 0–44)
AST: 20 U/L (ref 15–41)
Albumin: 3.8 g/dL (ref 3.5–5.0)
Alkaline Phosphatase: 69 U/L (ref 38–126)
Anion gap: 8 (ref 5–15)
BUN: 17 mg/dL (ref 8–23)
CO2: 25 mmol/L (ref 22–32)
Calcium: 9.2 mg/dL (ref 8.9–10.3)
Chloride: 103 mmol/L (ref 98–111)
Creatinine, Ser: 1.26 mg/dL — ABNORMAL HIGH (ref 0.61–1.24)
GFR, Estimated: >60 mL/min (ref >60)
Glucose, Bld: 101 mg/dL — ABNORMAL HIGH (ref 70–99)
Potassium: 4.4 mmol/L (ref 3.5–5.1)
Sodium: 136 mmol/L (ref 135–145)
Total Bilirubin: 0.9 mg/dL (ref 0.3–1.2)
Total Protein: 6.6 g/dL (ref 6.5–8.1)

## 2021-11-07 LAB — BRAIN NATRIURETIC PEPTIDE: B Natriuretic Peptide: 463.9 pg/mL — ABNORMAL HIGH (ref 0.0–100.0)

## 2021-11-07 NOTE — Progress Notes (Signed)
Patient ID: Kyle Bullock, male   DOB: [birth year], 70 y.o.   MRN: [id number]    Advanced Heart Failure Clinic Note    History of Present Illness: Kyle Bullock is a 70 y.o. male who is referred to the HF Clinic by Dr. Burt Knack. He has h/o CAD and ischemic cardiomyopathy 20-25%.  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    In December 2015, he suffered an acute anterolateral MI complicated by VF arrest in [place]. He underwent drug-eluting stent placement to the LAD and PTCA of the diagonal. EF was 25% and a life vest was placed prior to discharge. Following return to [place], he developed dyspnea and presented to [facility] where he was readmitted. His Brilinta was switched to Effient as it was felt the Brilinta may have been playing a role in his dyspnea. He was also switched from an ACE inhibitor to an ARB. He was seen by electrophysiology with recommendation for ongoing life vest therapy and follow-up echo in 90 days post revascularization. He was then seen in clinic in mid December where he reported pleuritic chest pain. He was readmitted and underwent diagnostic catheterization revealing patency of previously placed stent and area of balloon angioplasty in the diagonal. EF was 20-25% with diffuse hypokinesis and normal pericardial appearance. He did have some hypotension requiring discontinuation of ARB therapy. His beta blocker was also down titrated. He was diagnosed with Dressler's syndrome and was placed on prednisone taper and colchicine.   CT with COPD. Referred to Dr. Lake Bells who felt he had mild COPD but no ILD. Given PRN albuterol. Has repeat CT scheduled for 1 year.   Had ATP in 7/22. Found on device interrogation. Seen by EP. According to EP device showed initially irregular arrhythmia concerning for Afib. He then developed a regular tachy arrhythmia (likely VT) which was terminated with ATP. After ATP, his rhythm was again regular. Zio placed showed SVT with some  ectopy. No VT or AF.   On Friday 06/14/21 came home from dinner party developed SSCP reminiscent of previous angina. Responsive to 2NTG. Cath 07/22/21     Prox RCA to Mid RCA lesion is 55% stenosed.   1st Mrg lesion is 30% stenosed.   Ost LAD to Prox LAD lesion is 30% stenosed.   Previously placed Prox LAD to Dist LAD stent (unknown type) is  widely patent.   Findings:   Ao = 104/57 (77)  LV = 107/7 RA =  3 RV = 23/1 PA = 21/4 (10) PCW = 3 Fick cardiac output/index = 6.0/2.7 PVR = 1.2 WU Ao sat = 99% PA sat = 74%, 74%   Assessment: CAD with widely patent LAD stent Non-obstructive CAD in RCA and LCX with ~ 60% lesion throughout midRCA ICM with EF 25% Low filling pressures with preserved CO   He returns today for regular follow up with his wife. Doing well. Walking 2.5 miles 4-5x/week. Denies CP or SOB. Over the holiday had some fluid overload that responded to extra lasix. Still dizzy at times when he stands. No further CP at all.    Echo 06/05/20 EF 25%    ICD interrogated personally. No VT/AF Fluid is dry. Personally reviewed    Echo 3/16 EF 25-30% Echo 03/2016 EF 20-25% Echo 9/18 EF 20-25% RV normal   CPX 2/18 FVC 3.49 (72%)      FEV1 2.49 (68%)        FEV1/FVC 71 (93%)  MVV 117 (82%) Resting HR: 62 Peak HR: 148   (97% age predicted max HR) BP rest: 84/60 BP peak: 156/62 Peak VO2: 19.6 (69% predicted peak VO2) VE/VCO2 slope:  37 OUES: 2.14 Peak RER: 1.06 Ventilatory Threshold: 17.4 (61% predicted or measured peak VO2) VE/MVV:  66% O2pulse:  12   (71% predicted O2pulse)  CPX 05/14/2015  Peak VO2: 19.0 (64.5% predicted peak VO2) VE/VCO2 slope:  41.5 OUES: 2.01 Peak RER: 1.06 Ventilatory Threshold: 14.1          (47.9% predicted or measured peak VO2)   Past Medical History:  Diagnosis Date   Abnormal TSH    a. 11/2014: felt d/t sick euthyroid.   AICD (automatic cardioverter/defibrillator) present 02/28/2015   CAD (coronary artery disease)    a.  11/2014: arrest/STEMI s/p Xience DES to the LAD and PTCA to the D1 [place], [place]). b. Relook cath 11/2014: no culprit, patent stent.   CHF (congestive heart failure) (HCC)    Dressler's syndrome (HCC)    a. 11/2014: tx with colchicine and prednisone.   Dyslipidemia    Dyspnea    a. 11/2014: possibly due to combo of CHF and Brilinta. Brilinta changed to Effient.   Erectile dysfunction    a. Pt aware not to take ED med within 24 hr of NTG and vice versa.   Hypertension    Ischemic cardiomyopathy    a. 11/2014: EF reportedly 20% by cath and echo in [place].   NSVT (nonsustained ventricular tachycardia) 11/16/14   a. 11/2014: admitted after discharge from STEMI admission, 18 beats 135-140bpm in ED.   Pericardial effusion    a. 11/2014.   Pericarditis dx'd 11/2014   Pneumonia 1990's X 2   RBBB    Sleep apnea    suspected but not diagnosed (02/28/2015)   STEMI (ST elevation myocardial infarction) (HCC) 11/10/2014   Tobacco abuse    VF (ventricular fibrillation) (HCC) 11/10/14   a. 11/2014: arrest with STEMI.    Past Surgical History:  Procedure Laterality Date   A-FLUTTER ABLATION N/A 09/04/2020   Procedure: A-FLUTTER ABLATION;  Surgeon: Hillis Range, MD;  Location: [facility] INVASIVE CV LAB;  Service: Cardiovascular;  Laterality: N/A;   CARDIAC CATHETERIZATION  11/19/2014   CARDIAC DEFIBRILLATOR PLACEMENT  02/28/2015   CORONARY ANGIOPLASTY WITH STENT PLACEMENT  11/10/2014   LAD DES   HAND SURGERY Bilateral    Dupuytren's Contracture   IMPLANTABLE CARDIOVERTER DEFIBRILLATOR IMPLANT N/A 02/28/2015   SJM Fortify Assura VR ICD implanted by Dr Johney Frame.  Revascularized following VF arrest, EF remained depressed and ICD implanted   KNEE ARTHROSCOPY Right 1982   LEFT HEART CATH N/A 11/19/2014   Procedure: LEFT HEART CATH;  Surgeon: Runell Gess, MD;  Location: [facility] CATH LAB;  Service: Cardiovascular;  Laterality: N/A;   RIGHT/LEFT HEART CATH AND CORONARY ANGIOGRAPHY N/A 07/22/2021   Procedure:  RIGHT/LEFT HEART CATH AND CORONARY ANGIOGRAPHY;  Surgeon: Dolores Patty, MD;  Location: [facility] INVASIVE CV LAB;  Service: Cardiovascular;  Laterality: N/A;    Current Outpatient Medications  Medication Sig Dispense Refill   acetaminophen (TYLENOL) 500 MG tablet Take 500 mg by mouth every 6 (six) hours as needed for moderate pain.     ANORO ELLIPTA 62.5-25 MCG/INH AEPB Inhale 1 puff into the lungs in the morning.  11   aspirin EC 81 MG tablet Take 1 tablet (81 mg total) by mouth daily. Swallow whole. 90 tablet 3   atorvastatin (LIPITOR) 20 MG tablet TAKE 1 TABLET BY MOUTH DAILY  AT 6 PM 90 tablet 3   colchicine 0.6 MG tablet Take 0.6 mg by mouth daily as needed (gout flare).      empagliflozin (JARDIANCE) 10 MG TABS tablet Take 1 tablet (10 mg total) by mouth daily before breakfast. 90 tablet 3   ENTRESTO 24-26 MG TAKE 1 TABLET BY MOUTH TWICE DAILY 180 tablet 0   furosemide (LASIX) 20 MG tablet Take 20 mg by mouth as needed.     levothyroxine (SYNTHROID) 75 MCG tablet Take 75 mcg by mouth daily before breakfast.     metoprolol succinate (TOPROL XL) 25 MG 24 hr tablet Take 1 tablet (25 mg total) by mouth at bedtime. 90 tablet 3   nitroGLYCERIN (NITROSTAT) 0.4 MG SL tablet Place 1 tablet (0.4 mg total) under the tongue every 5 (five) minutes as needed for chest pain (MAX 3 TABLETS). 10 tablet 3   spironolactone (ALDACTONE) 25 MG tablet Take 0.5 tablets (12.5 mg total) by mouth daily. 45 tablet 3   triamcinolone cream (KENALOG) 0.5 % Apply 1 application topically 2 (two) times daily as needed (skin irritation/rash).     VASCEPA 1 g capsule Take 2 g by mouth 2 (two) times daily.      No current facility-administered medications for this encounter.    Allergies:   Ambien [zolpidem tartrate], Lisinopril, and Brilinta [ticagrelor]   Social History:  The patient  reports that he quit smoking about 6 years ago. His smoking use included cigarettes. He has a 80.00 pack-year smoking history. He has  never used smokeless tobacco. He reports current alcohol use of about 14.0 standard drinks per week. He reports that he does not use drugs.   Family History:  The patient's  family history includes COPD in his mother; Hypertension in his father and mother.   Review of systems complete and found to be negative unless listed in HPI.     Vitals:   11/07/21 1114  BP: 92/64  Pulse: 64  SpO2: 95%  Weight: 94.8 kg (209 lb)    Wt Readings from Last 3 Encounters:  11/07/21 94.8 kg (209 lb)  10/18/21 95.3 kg (210 lb 3.2 oz)  08/26/21 95.1 kg (209 lb 9.6 oz)   Physical exam: General:  Well appearing. No resp difficulty HEENT: normal Neck: supple. no JVD. Carotids 2+ bilat; no bruits. No lymphadenopathy or thryomegaly appreciated. Cor: PMI nondisplaced. Regular rate & rhythm. No rubs, gallops or murmurs. Lungs: clear Abdomen: soft, nontender, nondistended. No hepatosplenomegaly. No bruits or masses. Good bowel sounds. Extremities: no cyanosis, clubbing, rash, edema Neuro: alert & orientedx3, cranial nerves grossly intact. moves all 4 extremities w/o difficulty. Affect pleasant  Recent Labs: 07/15/2021: ALT 16; TSH 3.120 07/17/2021: Platelets 306 07/22/2021: Hemoglobin 10.5 08/26/2021: BUN 15; Creatinine, Ser 1.06; Magnesium 2.1; Potassium 4.6; Sodium 139   Lipid Panel     Component Value Date/Time   CHOL 108 01/08/2015 0850   CHOL 101 12/04/2014 1030   TRIG 155.0 (H) 01/08/2015 0850   HDL 39.00 (L) 01/08/2015 0850   HDL 43 12/04/2014 1030   CHOLHDL 3 01/08/2015 0850   VLDL 31.0 01/08/2015 0850   LDLCALC 38 01/08/2015 0850   LDLCALC 39 12/04/2014 1030     ASSESSMENT AND PLAN:  1. Chronic systolic due to ischemic CM  - Echo 03/2016 LVEF 20-25%, Grade 2 DD - Echo 3/18 shows with stable LVEF 20-25%. Normal RV.  - Echo 9/18 EF 20-25%, grade 2 DD  - Echo 05/16/20  EF 25% RV normal. -  Doing well. Stable NYHA II symptoms - Volume status stable on lasix 20 mg qod - Continue Entresto  24/26.  Up-titration Limited by hypotension.  - Continue carvedilol 6.25 mg BID.  - Continue sprio 25 mg daily.   - Continue Farxiga 10 - ICD interrogated personally. No VT. Volume status up and down. Currently dry.  - Check labs today - Repeat echo next visit  2.  CAD s/p Anterior MI December 2015:  - Cath 8/22 with stable CAD - No s/s angina - Continue ASA, statin and Effient  - Lipids per Dr. Clelia Croft. Will redraw today  3. H/o VT - no VT on device   4. Tobacco abuse:  - Remains quit  5. Emphysema - Sees pulmonary. Spirometry on CPX previously reviewed with only mild disease.     Arvilla Meres, MD  11:45 AM

## 2021-11-07 NOTE — Patient Instructions (Signed)
Medication Changes:  None  Lab Work:  Labs done today, your results will be available in MyChart, we will contact you for abnormal readings.   Testing/Procedures:  Your physician has requested that you have an echocardiogram. Echocardiography is a painless test that uses sound waves to create images of your heart. It provides your doctor with information about the size and shape of your heart and how well your heart's chambers and valves are working. This procedure takes approximately one hour. There are no restrictions for this procedure. IN 6 MONTHS  Referrals:  None  Special Instructions // Education:  None  Follow-Up in: 6 months an echocardiogram  At the Advanced Heart Failure Clinic, you and your health needs are our priority. We have a designated team specialized in the treatment of Heart Failure. This Care Team includes your primary Heart Failure Specialized Cardiologist (physician), Advanced Practice Providers (APPs- Physician Assistants and Nurse Practitioners), and Pharmacist who all work together to provide you with the care you need, when you need it.   You may see any of the following providers on your designated Care Team at your next follow up:  Dr Arvilla Meres Dr Carron Curie, NP Robbie Lis, [place] [facility] [place], [place] Karle Plumber, PharmD   Please be sure to bring in all your medications bottles to every appointment.   Need to Contact us:  If you have any questions or concerns before your next appointment please send [place] a message through [facility] [place] or call our office at [phone].    TO LEAVE A MESSAGE FOR THE NURSE SELECT OPTION 2, PLEASE LEAVE A MESSAGE INCLUDING: YOUR NAME DATE OF BIRTH CALL BACK NUMBER REASON FOR CALL**this is important as we prioritize the call backs  YOU WILL RECEIVE A CALL BACK THE SAME DAY AS LONG AS YOU CALL BEFORE 4:00 PM

## 2021-11-07 NOTE — Addendum Note (Signed)
Encounter addended by: Dolores Patty, MD on: 11/07/2021 11:35 PM  Actions taken: Level of Service modified, Visit diagnoses modified, Charge Capture section accepted

## 2021-11-10 ENCOUNTER — Other Ambulatory Visit (HOSPITAL_COMMUNITY): Payer: Self-pay | Admitting: Internal Medicine

## 2021-11-11 DIAGNOSIS — H903 Sensorineural hearing loss, bilateral: Secondary | ICD-10-CM | POA: Diagnosis not present

## 2021-11-13 ENCOUNTER — Other Ambulatory Visit (HOSPITAL_COMMUNITY): Payer: Self-pay | Admitting: *Deleted

## 2021-11-13 MED ORDER — ENTRESTO 24-26 MG PO TABS: 1.0000 | ORAL_TABLET | Freq: Two times a day (BID) | ORAL | 3 refills | Status: DC

## 2021-11-19 DIAGNOSIS — M7551 Bursitis of right shoulder: Secondary | ICD-10-CM | POA: Diagnosis not present

## 2021-11-19 DIAGNOSIS — M25511 Pain in right shoulder: Secondary | ICD-10-CM | POA: Diagnosis not present

## 2021-11-25 ENCOUNTER — Telehealth: Payer: Self-pay

## 2021-11-25 MED ORDER — METOPROLOL SUCCINATE ER 50 MG PO TB24: 50.0000 mg | ORAL_TABLET | Freq: Every day | ORAL | 3 refills | Status: DC

## 2021-11-25 NOTE — Telephone Encounter (Signed)
Abbott alert for sustained VT falling in to the VF zone, rate 240. ATP delivered x1 converting to slightly irregular VS. Event occurred 12/18 @ 1617. There are an addition 34 NSVT, all but one from 12/18 showing irregular R-R, rates 179-200.  Periods of regularity within EGM's.  Metoprolol prescribed. Two SVT events Route to triage per protocol.  Patient called reports he was sitting down watching tv and does not recall any complaints. Advised patient I will review with Dr. Johney Frame and give follow up phone call on any recommendations.   Spoke to Dr. Johney Frame, verbal order obtained to increase Toprol-XL to 50 mg daily. Follow up with Otilio Saber, PA-C. Driving restrictions to be put into place. Patient called and information relayed. Script sentto pharmacy.  Shock plan reviewed with patient with verbal understanding. [place] driving restrictions x6 months discussed with patient w/ verbal understanding.

## 2021-12-03 ENCOUNTER — Ambulatory Visit (INDEPENDENT_AMBULATORY_CARE_PROVIDER_SITE_OTHER): Payer: PPO

## 2021-12-03 DIAGNOSIS — I255 Ischemic cardiomyopathy: Secondary | ICD-10-CM

## 2021-12-04 LAB — CUP PACEART REMOTE DEVICE CHECK
Battery Remaining Longevity: 47 mo
Battery Remaining Percentage: 44 %
Battery Voltage: 2.98 V
Brady Statistic RV Percent Paced: 1 %
Date Time Interrogation Session: 20221228172936
HighPow Impedance: 64 Ohm
HighPow Impedance: 64 Ohm
Implantable Lead Implant Date: 20160323
Implantable Lead Location: 753860
Implantable Pulse Generator Implant Date: 20160323
Lead Channel Impedance Value: 350 Ohm
Lead Channel Pacing Threshold Amplitude: 0.75 V
Lead Channel Pacing Threshold Pulse Width: 0.5 ms
Lead Channel Sensing Intrinsic Amplitude: 11.9 mV
Lead Channel Setting Pacing Amplitude: 2.5 V
Lead Channel Setting Pacing Pulse Width: 0.5 ms
Lead Channel Setting Sensing Sensitivity: 0.5 mV
Pulse Gen Serial Number: 7255600

## 2021-12-06 ENCOUNTER — Telehealth: Payer: Self-pay

## 2021-12-06 DIAGNOSIS — I5022 Chronic systolic (congestive) heart failure: Secondary | ICD-10-CM | POA: Diagnosis not present

## 2021-12-06 DIAGNOSIS — E785 Hyperlipidemia, unspecified: Secondary | ICD-10-CM | POA: Diagnosis not present

## 2021-12-06 DIAGNOSIS — N1831 Chronic kidney disease, stage 3a: Secondary | ICD-10-CM | POA: Diagnosis not present

## 2021-12-06 DIAGNOSIS — I130 Hypertensive heart and chronic kidney disease with heart failure and stage 1 through stage 4 chronic kidney disease, or unspecified chronic kidney disease: Secondary | ICD-10-CM | POA: Diagnosis not present

## 2021-12-06 NOTE — Telephone Encounter (Signed)
Spoke with patient regarding ICD shock from 12/05/21 at 4:53pm patient stated that he was working out in the yard patient stated that he had taken his Toprol XL 50mg  that morning, patient stated that he felt fine after the shock. Informed patient that I would review this with Dr. and call back with any recommendations. Instructed patient to keep his appointment with Any Tillery on 12/11/21

## 2021-12-10 ENCOUNTER — Encounter: Payer: PPO | Admitting: Student

## 2021-12-11 ENCOUNTER — Encounter: Payer: Self-pay | Admitting: Student

## 2021-12-11 ENCOUNTER — Other Ambulatory Visit: Payer: Self-pay

## 2021-12-11 ENCOUNTER — Ambulatory Visit (INDEPENDENT_AMBULATORY_CARE_PROVIDER_SITE_OTHER): Payer: PPO | Admitting: Student

## 2021-12-11 ENCOUNTER — Ambulatory Visit (INDEPENDENT_AMBULATORY_CARE_PROVIDER_SITE_OTHER): Payer: PPO

## 2021-12-11 VITALS — BP 90/60 | HR 53 | Ht 75.0 in | Wt 209.0 lb

## 2021-12-11 DIAGNOSIS — I471 Supraventricular tachycardia: Secondary | ICD-10-CM | POA: Diagnosis not present

## 2021-12-11 DIAGNOSIS — I10 Essential (primary) hypertension: Secondary | ICD-10-CM

## 2021-12-11 DIAGNOSIS — I2510 Atherosclerotic heart disease of native coronary artery without angina pectoris: Secondary | ICD-10-CM

## 2021-12-11 DIAGNOSIS — I4901 Ventricular fibrillation: Secondary | ICD-10-CM

## 2021-12-11 DIAGNOSIS — I4892 Unspecified atrial flutter: Secondary | ICD-10-CM | POA: Diagnosis not present

## 2021-12-11 DIAGNOSIS — I519 Heart disease, unspecified: Secondary | ICD-10-CM

## 2021-12-11 LAB — CUP PACEART INCLINIC DEVICE CHECK
Battery Remaining Longevity: 46 mo
Brady Statistic RV Percent Paced: 0.42 %
Date Time Interrogation Session: 20230104123321
HighPow Impedance: 69.75 Ohm
Implantable Lead Implant Date: 20160323
Implantable Lead Location: 753860
Implantable Pulse Generator Implant Date: 20160323
Lead Channel Impedance Value: 362.5 Ohm
Lead Channel Pacing Threshold Amplitude: 0.75 V
Lead Channel Pacing Threshold Amplitude: 0.75 V
Lead Channel Pacing Threshold Pulse Width: 0.5 ms
Lead Channel Pacing Threshold Pulse Width: 0.5 ms
Lead Channel Sensing Intrinsic Amplitude: 12 mV
Lead Channel Setting Pacing Amplitude: 2.5 V
Lead Channel Setting Pacing Pulse Width: 0.5 ms
Lead Channel Setting Sensing Sensitivity: 0.5 mV
Pulse Gen Serial Number: 7255600

## 2021-12-11 NOTE — Progress Notes (Unsigned)
Enrolled patient for a 14 day Zio AT monitor to be mailed to patients home.  

## 2021-12-11 NOTE — Patient Instructions (Signed)
Medication Instructions:  Your physician recommends that you continue on your current medications as directed. Please refer to the Current Medication list given to you today.  *If you need a refill on your cardiac medications before your next appointment, please call your pharmacy*   Lab Work: TODAY: CMET, TSH, CBC  If you have labs (blood work) drawn today and your tests are completely normal, you will receive your results only by: [place] (if you have MyChart) OR A paper copy in the mail If you have any lab test that is abnormal or we need to change your treatment, we will call you to review the results.   Testing/Procedures: Your physician has requested that you have an echocardiogram. Echocardiography is a painless test that uses sound waves to create images of your heart. It provides your doctor with information about the size and shape of your heart and how well your hearts chambers and valves are working. This procedure takes approximately one hour. There are no restrictions for this procedure.   Follow-Up: At [facility], you and your health needs are our priority.  As part of our continuing mission to provide you with exceptional heart care, we have created designated Provider Care Teams.  These Care Teams include your primary Cardiologist (physician) and Advanced Practice Providers (APPs -  Physician Assistants and Nurse Practitioners) who all work together to provide you with the care you need, when you need it.  Your next appointment:   4 -6 week(s)  The format for your next appointment:   In Person  Provider:   Virl Axe, MD    Other Instructions ZIO AT Long term monitor-Live Telemetry  Your physician has requested you wear a ZIO patch monitor for 14 days.  This is a single patch monitor. [employer] supplies one patch monitor per enrollment. Additional  stickers are not available.  Please do not apply patch if you will be having a Nuclear Stress Test,  Echocardiogram, Cardiac CT, MRI,  or Chest Xray during the period you would be wearing the monitor. The patch cannot be worn during  these tests. You cannot remove and re-apply the ZIO AT patch monitor.  Your ZIO patch monitor will be mailed 3 day USPS to your address on file. It may take 3-5 days to  receive your monitor after you have been enrolled.  Once you have received your monitor, please review the enclosed instructions. Your monitor has  already been registered assigning a specific monitor serial # to you.   Billing and Patient Assistance Program information  Theodore Demark has been supplied with any insurance information on record for billing. [employer] offers a sliding scale Patient Assistance Program for patients without insurance, or whose  insurance does not completely cover the cost of the ZIO patch monitor. You must apply for the  Patient Assistance Program to qualify for the discounted rate. To apply, call [employer] at [phone],  select option 4, select option 2 , ask to apply for the Patient Assistance Program, (you can request an  interpreter if needed). [employer] will ask your household income and how many people are in your  household. [employer] will quote your out-of-pocket cost based on this information. They will also be able  to set up a 12 month interest free payment plan if needed.  Applying the monitor   Shave hair from upper left chest.  Hold the abrader disc by orange tab. Rub the abrader in 40 strokes over left upper chest as indicated in  your  monitor instructions.  Clean area with 4 enclosed alcohol pads. Use all pads to ensure the area is cleaned thoroughly. Let  dry.  Apply patch as indicated in monitor instructions. Patch will be placed under collarbone on left side of  chest with arrow pointing upward.  Rub patch adhesive wings for 2 minutes. Remove the white label marked "1". Remove the white label  marked "2". Rub patch adhesive wings for 2 additional minutes.   While looking in a mirror, press and release button in center of patch. A small green light will flash 3-4  times. This will be your only indicator that the monitor has been turned on.  Do not shower for the first 24 hours. You may shower after the first 24 hours.  Press the button if you feel a symptom. You will hear a small click. Record Date, Time and Symptom in  the Patient Log.   Starting the Gateway  In your kit there is a Hydrographic surveyor box the size of a cellphone. This is Airline pilot. It transmits all your  recorded data to [facility]. This box must always stay within 10 feet of you. Open the box and push the *  button. There will be a light that blinks orange and then green a few times. When the light stops  blinking, the Gateway is connected to the ZIO patch. Call [employer] at [phone] to confirm your monitor is transmitting.  Returning your monitor  Remove your patch and place it inside the [place]. In the lower half of the Gateway there is a white  bag with prepaid postage on it. Place Gateway in bag and seal. Mail package back to [place] as soon as  possible. Your physician should have your final report approximately 7 days after you have mailed back  your monitor. Call [place] at [phone] if you have questions regarding your ZIO AT  patch monitor. Call them immediately if you see an orange light blinking on your monitor.  If your monitor falls off in less than 4 days, contact our Monitor department at [phone]. If your  monitor becomes loose or falls off after 4 days call [employer] at [phone] for suggestions on  securing your monitor

## 2021-12-11 NOTE — Progress Notes (Addendum)
Electrophysiology Office Note Date: 12/11/2021  ID:  Kyle Bullock, Kyle Bullock December 19, 1950, MRN [id number]  PCP: Ginger Organ., MD Primary Cardiologist: None Electrophysiologist: Thompson Grayer, MD   CC: Routine ICD follow-up  Kyle Bullock is a 71 y.o. male seen today for Thompson Grayer, MD for acute visit due to ICD therapy .  Since last being seen in our clinic the patient reports doing well. He was working his yard 12/29 quite strenuously stacking wood and carrying large loads, when he received an ICD shock. He did recognize that his heart was beating "fast and hard" while he was exerting himself, but did not feel this was undue. Denies chest pain, fever, chills, syncope, lightheadedness, or dizziness.    Device History: St. Jude Single Chamber ICD implanted 2016 for chronic systolic CHF / ICM  Past Medical History:  Diagnosis Date   Abnormal TSH    a. 11/2014: felt d/t sick euthyroid.   AICD (automatic cardioverter/defibrillator) present 02/28/2015   CAD (coronary artery disease)    a. 11/2014: arrest/STEMI s/p Xience DES to the LAD and PTCA to the D1 ([place]). b. Relook cath 11/2014: no culprit, patent stent.   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 11/2014: tx with colchicine and prednisone.   Dyslipidemia    Dyspnea    a. 11/2014: possibly due to combo of CHF and Brilinta. Brilinta changed to Effient.   Erectile dysfunction    a. Pt aware not to take ED med within 24 hr of NTG and vice versa.   Hypertension    Ischemic cardiomyopathy    a. 11/2014: EF reportedly 20% by cath and echo in [place].   NSVT (nonsustained ventricular tachycardia) 11/16/14   a. 11/2014: admitted after discharge from STEMI admission, 18 beats 135-140bpm in ED.   Pericardial effusion    a. 11/2014.   Pericarditis dx'd 11/2014   Pneumonia 1990's X 2   RBBB    Sleep apnea    suspected but not diagnosed (02/28/2015)   STEMI (ST elevation myocardial  infarction) ([place]) 11/10/2014   Tobacco abuse    VF (ventricular fibrillation) ([place]) 11/10/14   a. 11/2014: arrest with STEMI.   Past Surgical History:  Procedure Laterality Date   A-FLUTTER ABLATION N/A 09/04/2020   Procedure: A-FLUTTER ABLATION;  Surgeon: Thompson Grayer, MD;  Location: [facility];  Service: Cardiovascular;  Laterality: N/A;   CARDIAC CATHETERIZATION  11/19/2014   CARDIAC DEFIBRILLATOR PLACEMENT  02/28/2015   CORONARY ANGIOPLASTY WITH STENT PLACEMENT  11/10/2014   LAD DES   HAND SURGERY Bilateral    Dupuytren's Contracture   IMPLANTABLE CARDIOVERTER DEFIBRILLATOR IMPLANT N/A 02/28/2015   SJM Fortify Assura VR ICD implanted by Dr Rayann Heman.  Revascularized following VF arrest, EF remained depressed and ICD implanted   KNEE ARTHROSCOPY Right 1982   LEFT HEART CATH N/A 11/19/2014   Procedure: LEFT HEART CATH;  Surgeon: Lorretta Harp, MD;  Location: [facility] C[facility];  Service: Cardiovascular;  Laterality: N/A;   RIGHT/LEFT HEART CATH AND CORONARY ANGIOGRAPHY N/A 07/22/2021   Procedure: RIGHT/LEFT HEART CATH AND CORONARY ANGIOGRAPHY;  Surgeon: Jolaine Artist, MD;  Location: [facility];  Service: Cardiovascular;  Laterality: N/A;    Current Outpatient Medications  Medication Sig Dispense Refill   acetaminophen (TYLENOL) 500 MG tablet Take 500 mg by mouth every 6 (six) hours as needed for moderate pain.     ANORO ELLIPTA 62.5-25 MCG/INH AEPB Inhale  1 puff into the lungs in the morning.  11   aspirin EC 81 MG tablet Take 1 tablet (81 mg total) by mouth daily. Swallow whole. 90 tablet 3   atorvastatin (LIPITOR) 20 MG tablet TAKE 1 TABLET BY MOUTH DAILY AT 6 PM 90 tablet 3   colchicine 0.6 MG tablet Take 0.6 mg by mouth daily as needed (gout flare).      empagliflozin (JARDIANCE) 10 MG TABS tablet Take 1 tablet (10 mg total) by mouth daily before breakfast. 90 tablet 3   furosemide (LASIX) 20 MG tablet Take 20 mg by mouth as needed.     levothyroxine (SYNTHROID) 75  MCG tablet Take 75 mcg by mouth daily before breakfast.     metoprolol succinate (TOPROL-XL) 50 MG 24 hr tablet Take 1 tablet (50 mg total) by mouth daily. Take with or immediately following a meal. 90 tablet 3   nitroGLYCERIN (NITROSTAT) 0.4 MG SL tablet Place 1 tablet (0.4 mg total) under the tongue every 5 (five) minutes as needed for chest pain (MAX 3 TABLETS). 10 tablet 3   sacubitril-valsartan (ENTRESTO) 24-26 MG Take 1 tablet by mouth 2 (two) times daily. 180 tablet 3   spironolactone (ALDACTONE) 25 MG tablet Take 0.5 tablets (12.5 mg total) by mouth daily. 45 tablet 3   triamcinolone cream (KENALOG) 0.5 % Apply 1 application topically 2 (two) times daily as needed (skin irritation/rash).     VASCEPA 1 g capsule Take 2 g by mouth 2 (two) times daily.      No current facility-administered medications for this visit.    Allergies:   Ambien [zolpidem tartrate], Lisinopril, and Brilinta [ticagrelor]   Social History: Social History   Socioeconomic History   Marital status: Married    Spouse name: Not on file   Number of children: 2   Years of education: Not on file   Highest education level: Not on file  Occupational History   Not on file  Tobacco Use   Smoking status: Former    Packs/day: 2.00    Years: 40.00    Pack years: 80.00    Types: Cigarettes    Quit date: 11/10/2014    Years since quitting: 7.0   Smokeless tobacco: Never  Vaping Use   Vaping Use: Never used  Substance and Sexual Activity   Alcohol use: Yes    Alcohol/week: 14.0 standard drinks    Types: 14 Glasses of wine per week    Comment: 02/28/2015 "2, 4oz glasses of wine/night"   Drug use: No   Sexual activity: Not Currently  Other Topics Concern   Not on file  Social History Narrative   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  Now lives in [place].   Social Determinants of Health   Financial Resource Strain: Not on file  Food Insecurity: Not on file  Transportation Needs:  Not on file  Physical Activity: Not on file  Stress: Not on file  Social Connections: Not on file  Intimate Partner Violence: Not on file    Family History: Family History  Problem Relation Age of Onset   COPD Mother    Hypertension Mother    Hypertension Father    CAD Neg Hx     Review of Systems: All other systems reviewed and are otherwise negative except as noted above.   Physical Exam: Vitals:   12/11/21 1108  BP: 90/60  Pulse: (!) 53  SpO2: 93%  Weight: 209 lb (94.8 kg)  Height: 6\' 3"  (1.905 m)     GEN- The patient is well appearing, alert and oriented x 3 today.   HEENT: normocephalic, atraumatic; sclera clear, conjunctiva pink; hearing intact; oropharynx clear; neck supple, no JVP Lymph- no cervical lymphadenopathy Lungs- Clear to ausculation bilaterally, normal work of breathing.  No wheezes, rales, rhonchi Heart- Regular rate and rhythm, no murmurs, rubs or gallops, PMI not laterally displaced GI- soft, non-tender, non-distended, bowel sounds present, no hepatosplenomegaly Extremities- no clubbing or cyanosis. No edema; DP/PT/radial pulses 2+ bilaterally MS- no significant deformity or atrophy Skin- warm and dry, no rash or lesion; ICD pocket well healed Psych- euthymic mood, full affect Neuro- strength and sensation are intact  ICD interrogation- reviewed in detail today,  See PACEART report  EKG:  EKG is ordered today. Personal review of EKG ordered today shows sinus bradycardia  Recent Labs: 07/15/2021: TSH 3.120 07/17/2021: Platelets 306 07/22/2021: Hemoglobin 10.5 08/26/2021: Magnesium 2.1 11/07/2021: ALT 21; B Natriuretic Peptide 463.9; BUN 17; Creatinine, Ser 1.26; Potassium 4.4; Sodium 136   Wt Readings from Last 3 Encounters:  12/11/21 209 lb (94.8 kg)  11/07/21 209 lb (94.8 kg)  10/18/21 210 lb 3.2 oz (95.3 kg)     Other studies Reviewed: Additional studies/ records that were reviewed today include: Previous EP office notes.   Assessment  and Plan:  1.  Chronic systolic dysfunction s/p St. Jude single chamber ICD  euvolemic today Stable on an appropriate medical regimen Normal ICD function See Claudia Desanctis Art report In discussion with both Dr. Curt Bears and Dr. Caryl Comes, difficult to tell if arrhyhtmia true VT, or more likely SVT/Afib. Several episodes are preceded by irregular rapid rhythm, while others preceded by sinus, or a regular rapid rhythm. Previous monitor showed no sustained arrhythmias.  Will go ahead and update Echo.     2. CAD L./RHC 07/22/2021 with known CARD with widely patent LAD stent. Non-obstructive CAD in RCA and LCX with ~60% lesion through mRCA.  Denies chest pain.   3. Atrial flutter s/p CTI ablation by Dr. Rayann Heman 09/04/20 Eliquis stopped 10/18/2020 with no recurrence  Zio patch with short SVT, longest 13.3 seconds. No clear, clinical atrial fib. If episodes of tachycardia preceded by irregular rhythm were atrial fib, remains SCAF per review with Dr. Caryl Comes.  CHA2DS2/VASc would be at least 4 if identified.  Discussed this at length. In shared decision making, pt wishes to remain off of [place] until AF is clearly identified. He would prefer to stay off of eliquis if possible. I also lowered his device detection so that slower rapid rates (>157) will be logged.     4. VT He has had both regular and irregular tachy rhythms requiring ATP, and now a regular tachy rhythm leading to ICD shock, but rhythm before and after irregular appearing.  Optimize BB as tolerated. He is on Toprol 50 mg daily which was recently increased, he prefers to hold off on further for now, and would do so hesitantly with low BP.  No progression of CAD on recent cath.  Have reviewed [place] guidelines with patient of no driving x 6 month after appropriate device therapy. He would prefer to avoid admission, so would likely consider amiodarone (as oppose to Tikosyn) if continues.  Adjusted his zones for therapies only over 218 bpm, as his SVT cycle  length seems to hover just below this.    Current medicines are reviewed at length with the patient today.    Labs/ tests ordered today include:  Orders Placed  This Encounter  Procedures   Comprehensive metabolic panel   TSH   CBC   LONG TERM MONITOR-LIVE TELEMETRY (3-14 DAYS)   CUP PACEART INCLINIC DEVICE CHECK   EKG 12-Lead   ECHOCARDIOGRAM COMPLETE     Disposition:   Follow up with Dr. Caryl Comes in  4-6 weeks for additional input.     Jacalyn Lefevre, PA-C  12/11/2021 11:30 AM  [facility] [address] [place] [address] [phone] (office) [phone] (fax)   ADDENDUM Updated note for clarity.   Had previously referred to Sotalol, which is contraindicated in this patient with reduced EF.  Would need to consider Tikosyn instead.  Changed in note above to prevent confusion with referral back to AF clinic.   Legrand Como [address] [place], PA-C  12/16/2021 8:15 AM

## 2021-12-12 LAB — COMPREHENSIVE METABOLIC PANEL
ALT: 13 IU/L (ref 0–44)
AST: 14 IU/L (ref 0–40)
Albumin/Globulin Ratio: 2.3 — ABNORMAL HIGH (ref 1.2–2.2)
Albumin: 4.3 g/dL (ref 3.7–4.7)
Alkaline Phosphatase: 92 IU/L (ref 44–121)
BUN/Creatinine Ratio: 15 (ref 10–24)
BUN: 19 mg/dL (ref 8–27)
Bilirubin Total: 0.5 mg/dL (ref 0.0–1.2)
CO2: 21 mmol/L (ref 20–29)
Calcium: 9.3 mg/dL (ref 8.6–10.2)
Chloride: 100 mmol/L (ref 96–106)
Creatinine, Ser: 1.29 mg/dL — ABNORMAL HIGH (ref 0.76–1.27)
Globulin, Total: 1.9 g/dL (ref 1.5–4.5)
Glucose: 103 mg/dL — ABNORMAL HIGH (ref 70–99)
Potassium: 4.9 mmol/L (ref 3.5–5.2)
Sodium: 139 mmol/L (ref 134–144)
Total Protein: 6.2 g/dL (ref 6.0–8.5)
eGFR: 59 mL/min/{1.73_m2} — ABNORMAL LOW (ref >59)

## 2021-12-12 LAB — CBC
Hematocrit: 45.6 % (ref 37.5–51.0)
Hemoglobin: 15.6 g/dL (ref 13.0–17.7)
MCH: 30.5 pg (ref 26.6–33.0)
MCHC: 34.2 g/dL (ref 31.5–35.7)
MCV: 89 fL (ref 79–97)
Platelets: 309 10*3/uL (ref 150–450)
RBC: 5.12 x10E6/uL (ref 4.14–5.80)
RDW: 12.4 % (ref 11.6–15.4)
WBC: 7.9 10*3/uL (ref 3.4–10.8)

## 2021-12-12 LAB — TSH: TSH: 2.55 u[IU]/mL (ref 0.450–4.500)

## 2021-12-12 NOTE — Progress Notes (Signed)
Remote ICD transmission.   

## 2021-12-13 DIAGNOSIS — I471 Supraventricular tachycardia: Secondary | ICD-10-CM | POA: Diagnosis not present

## 2021-12-13 DIAGNOSIS — I4892 Unspecified atrial flutter: Secondary | ICD-10-CM | POA: Diagnosis not present

## 2021-12-14 DIAGNOSIS — I4892 Unspecified atrial flutter: Secondary | ICD-10-CM | POA: Diagnosis not present

## 2021-12-14 DIAGNOSIS — I471 Supraventricular tachycardia: Secondary | ICD-10-CM | POA: Diagnosis not present

## 2021-12-15 ENCOUNTER — Telehealth: Payer: Self-pay | Admitting: Physician Assistant

## 2021-12-15 DIAGNOSIS — I4891 Unspecified atrial fibrillation: Secondary | ICD-10-CM

## 2021-12-15 NOTE — Telephone Encounter (Signed)
iRhythm called for atrial flutter - first documentation at 12:55 pm. Asymptomatic, auto-triggered. Pt was called and reported no symptoms.   I will sent to [facility].

## 2021-12-16 DIAGNOSIS — H903 Sensorineural hearing loss, bilateral: Secondary | ICD-10-CM | POA: Diagnosis not present

## 2021-12-16 MED ORDER — APIXABAN 5 MG PO TABS: 5.0000 mg | ORAL_TABLET | Freq: Two times a day (BID) | ORAL | 6 refills | Status: DC

## 2021-12-16 NOTE — Telephone Encounter (Signed)
Per Oda Kilts (note below), Pt had episode of A-fib and needs to begin Eliquis 5mg  BID. Called pt to discuss this recommendation and pt agrees with plan of care. Pt requested samples and informed pt that we will give him some and place at the front for him to pick up. Pt also aware that EP will call him to schedule f/u appt. Jonni Sanger (verbal consent) also advised to keep pt on his Aspirin at this time due to presence of stent and states that he will discuss this further at next visit.  Shirley Friar, PA-C  Triage, Please see note below to restart Eliquis 5 mg BID with atrial fib on monitor.  Would recommend he go ahead and restart, but if he would prefer in person discussion would be OK ( we had previously discussed the potential risk of stroke OFF anticoagulation).   Reviewed with Dr. Quentin Ore and would not use Tikosyn with h/o VT.   Would recommend discussing amiodarone.  OK for EP APP or AF clinic appt.

## 2021-12-16 NOTE — Telephone Encounter (Signed)
Called and left message for patient to call back to schedule appt to discuss amio per Otilio Saber, PA-C.

## 2021-12-16 NOTE — Addendum Note (Signed)
Addended by: Lars Mage on: 12/16/2021 01:15 PM   Modules accepted: Orders

## 2021-12-16 NOTE — Telephone Encounter (Signed)
Patient returned my call and is agreeable to appt 12/25/21 with Rudi Coco, NP.

## 2021-12-24 ENCOUNTER — Other Ambulatory Visit: Payer: Self-pay | Admitting: Internal Medicine

## 2021-12-25 ENCOUNTER — Encounter (HOSPITAL_COMMUNITY): Payer: Self-pay | Admitting: Nurse Practitioner

## 2021-12-25 ENCOUNTER — Ambulatory Visit (HOSPITAL_COMMUNITY)
Admission: RE | Admit: 2021-12-25 | Discharge: 2021-12-25 | Disposition: A | Discharge status: Home / Self Care | Payer: PPO | Source: Ambulatory Visit | Attending: Nurse Practitioner | Admitting: Nurse Practitioner

## 2021-12-25 ENCOUNTER — Other Ambulatory Visit: Payer: Self-pay

## 2021-12-25 VITALS — BP 108/56 | HR 54 | Ht 75.0 in | Wt 208.8 lb

## 2021-12-25 DIAGNOSIS — Z7901 Long term (current) use of anticoagulants: Secondary | ICD-10-CM | POA: Insufficient documentation

## 2021-12-25 DIAGNOSIS — I509 Heart failure, unspecified: Secondary | ICD-10-CM | POA: Insufficient documentation

## 2021-12-25 DIAGNOSIS — Z95810 Presence of automatic (implantable) cardiac defibrillator: Secondary | ICD-10-CM | POA: Diagnosis not present

## 2021-12-25 DIAGNOSIS — I483 Typical atrial flutter: Secondary | ICD-10-CM | POA: Diagnosis not present

## 2021-12-25 DIAGNOSIS — I2510 Atherosclerotic heart disease of native coronary artery without angina pectoris: Secondary | ICD-10-CM | POA: Insufficient documentation

## 2021-12-25 DIAGNOSIS — Z79890 Hormone replacement therapy (postmenopausal): Secondary | ICD-10-CM | POA: Diagnosis not present

## 2021-12-25 DIAGNOSIS — I4891 Unspecified atrial fibrillation: Secondary | ICD-10-CM | POA: Diagnosis not present

## 2021-12-25 DIAGNOSIS — Z79899 Other long term (current) drug therapy: Secondary | ICD-10-CM | POA: Insufficient documentation

## 2021-12-25 DIAGNOSIS — I480 Paroxysmal atrial fibrillation: Secondary | ICD-10-CM | POA: Diagnosis not present

## 2021-12-25 DIAGNOSIS — I472 Ventricular tachycardia, unspecified: Secondary | ICD-10-CM | POA: Diagnosis not present

## 2021-12-25 DIAGNOSIS — I4510 Unspecified right bundle-branch block: Secondary | ICD-10-CM | POA: Diagnosis not present

## 2021-12-25 DIAGNOSIS — I110 Hypertensive heart disease with heart failure: Secondary | ICD-10-CM | POA: Insufficient documentation

## 2021-12-25 DIAGNOSIS — Z8249 Family history of ischemic heart disease and other diseases of the circulatory system: Secondary | ICD-10-CM | POA: Insufficient documentation

## 2021-12-25 DIAGNOSIS — R001 Bradycardia, unspecified: Secondary | ICD-10-CM | POA: Insufficient documentation

## 2021-12-25 DIAGNOSIS — Z955 Presence of coronary angioplasty implant and graft: Secondary | ICD-10-CM | POA: Diagnosis not present

## 2021-12-25 DIAGNOSIS — I252 Old myocardial infarction: Secondary | ICD-10-CM | POA: Diagnosis not present

## 2021-12-25 DIAGNOSIS — I4901 Ventricular fibrillation: Secondary | ICD-10-CM | POA: Diagnosis not present

## 2021-12-25 MED ORDER — METOPROLOL SUCCINATE ER 25 MG PO TB24: 25.0000 mg | ORAL_TABLET | Freq: Every day | ORAL | 3 refills | Status: DC

## 2021-12-25 MED ORDER — AMIODARONE HCL 200 MG PO TABS: ORAL_TABLET | ORAL | 0 refills | Status: DC

## 2021-12-25 NOTE — Patient Instructions (Signed)
Decrease metoprolol to 25mg  once a day    Start Amiodarone 200mg  twice a day for the next month then reduce to 200mg  once a day - take with food

## 2021-12-25 NOTE — Progress Notes (Addendum)
Primary Care Physician: Ginger Organ., MD Referring Physician: Device clinic, Oda Kilts, [place] EP: formerly Dr. Rayann Heman, to establish with Dr. 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 [place], [place], 12/11/21. He states that he received the shock form his ICD after a day of heavy exertion loading wood onto his deck.  On device interrogation, he was found to have both regular and irregular tachy rhythms requiring ATP, and most recent  a regular tachy rhythm leading to ICD shock, but rhythm before and after irregular appearing.   His BB was optimized  to Toprol 50 mg daily. He  runs a low BP which limits up titration.  No progression of CAD on recent cath.  Jonni Sanger reviewed [place] guidelines with patient of no driving x 6 month after appropriate device therapy. He would prefer to avoid admission, so would likely consider amiodarone (as oppose to Tikosyn) if continues.  Jonni Sanger also adjusted his zones for therapies only over 218 bpm, as his SVT cycle length seems to hover just below this. A ZIO monitor was placed.   Recent monitor  report, 12/15/21, showed afib which prompted referral to afib clinic. Jonni Sanger ordered for pt to start eliquis 5 mg bid.CHA2DS2VASc score of 3, and wanted him to further discuss starting of amiodarone as irregular rhythm may have triggered inappropriate shock in the  recent past. He was on amiodarone in the past prior to typical atrial flutter ablation  09/04/20. It was stopped after successful ablation. Best to avoid Tikosyn,  per Dr. Quentin Ore, due to h/o VT/VF. EKG today shows SR with a qtc of 432 ms. Recent chest CT did not show evidence of interstitial lung disease, TSH and liver enzymes normal. He did tolerate amio in the past well.     Today, he denies symptoms of palpitations, chest pain, shortness of breath, orthopnea, PND, lower  extremity edema, dizziness, presyncope, syncope, or neurologic sequela. The patient is tolerating medications without difficulties and is otherwise without complaint today.   Past Medical History:  Diagnosis Date   Abnormal TSH    a. 11/2014: felt d/t sick euthyroid.   AICD (automatic cardioverter/defibrillator) present 02/28/2015   CAD (coronary artery disease)    a. 11/2014: arrest/STEMI s/p Xience DES to the LAD and PTCA to the D1 ([place]). b. Relook cath 11/2014: no culprit, patent stent.   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 11/2014: tx with colchicine and prednisone.   Dyslipidemia    Dyspnea    a. 11/2014: possibly due to combo of CHF and Brilinta. Brilinta changed to Effient.   Erectile dysfunction    a. Pt aware not to take ED med within 24 hr of NTG and vice versa.   Hypertension    Ischemic cardiomyopathy    a. 11/2014: EF reportedly 20% by cath and echo in [place].   NSVT (nonsustained ventricular tachycardia) 11/16/14   a. 11/2014: admitted after discharge from STEMI admission, 18 beats 135-140bpm in ED.   Pericardial effusion    a. 11/2014.   Pericarditis dx'd 11/2014   Pneumonia 1990's X 2   RBBB    Sleep apnea    suspected but not diagnosed (02/28/2015)   STEMI (ST elevation myocardial infarction) ([place]) 11/10/2014   Tobacco abuse    VF (ventricular fibrillation) ([place])  11/10/14   a. 11/2014: arrest with STEMI.   Past Surgical History:  Procedure Laterality Date   A-FLUTTER ABLATION N/A 09/04/2020   Procedure: A-FLUTTER ABLATION;  Surgeon: Thompson Grayer, MD;  Location: [facility];  Service: Cardiovascular;  Laterality: N/A;   CARDIAC CATHETERIZATION  11/19/2014   CARDIAC DEFIBRILLATOR PLACEMENT  02/28/2015   CORONARY ANGIOPLASTY WITH STENT PLACEMENT  11/10/2014   LAD DES   HAND SURGERY Bilateral    Dupuytren's Contracture   IMPLANTABLE CARDIOVERTER DEFIBRILLATOR IMPLANT N/A 02/28/2015   SJM Fortify Assura VR ICD  implanted by Dr Rayann Heman.  Revascularized following VF arrest, EF remained depressed and ICD implanted   KNEE ARTHROSCOPY Right 1982   LEFT HEART CATH N/A 11/19/2014   Procedure: LEFT HEART CATH;  Surgeon: Lorretta Harp, MD;  Location: [facility];  Service: Cardiovascular;  Laterality: N/A;   RIGHT/LEFT HEART CATH AND CORONARY ANGIOGRAPHY N/A 07/22/2021   Procedure: RIGHT/LEFT HEART CATH AND CORONARY ANGIOGRAPHY;  Surgeon: Jolaine Artist, MD;  Location: [facility];  Service: Cardiovascular;  Laterality: N/A;    Current Outpatient Medications  Medication Sig Dispense Refill   acetaminophen (TYLENOL) 500 MG tablet Take 500 mg by mouth every 6 (six) hours as needed for moderate pain.     ANORO ELLIPTA 62.5-25 MCG/INH AEPB Inhale 1 puff into the lungs in the morning.  11   apixaban (ELIQUIS) 5 MG TABS tablet Take 1 tablet (5 mg total) by mouth 2 (two) times daily. 60 tablet 6   aspirin EC 81 MG tablet Take 1 tablet (81 mg total) by mouth daily. Swallow whole. 90 tablet 3   atorvastatin (LIPITOR) 20 MG tablet TAKE 1 TABLET BY MOUTH DAILY AT 6 PM 90 tablet 3   colchicine 0.6 MG tablet Take 0.6 mg by mouth daily as needed (gout flare).      empagliflozin (JARDIANCE) 10 MG TABS tablet Take 1 tablet (10 mg total) by mouth daily before breakfast. 90 tablet 3   furosemide (LASIX) 20 MG tablet Take 20 mg by mouth as needed.     levothyroxine (SYNTHROID) 75 MCG tablet Take 75 mcg by mouth daily before breakfast.     metoprolol succinate (TOPROL-XL) 50 MG 24 hr tablet Take 1 tablet (50 mg total) by mouth daily. Take with or immediately following a meal. 90 tablet 3   nitroGLYCERIN (NITROSTAT) 0.4 MG SL tablet Place 1 tablet (0.4 mg total) under the tongue every 5 (five) minutes as needed for chest pain (MAX 3 TABLETS). 10 tablet 3   sacubitril-valsartan (ENTRESTO) 24-26 MG Take 1 tablet by mouth 2 (two) times daily. 180 tablet 3   spironolactone (ALDACTONE) 25 MG tablet Take 0.5 tablets (12.5  mg total) by mouth daily. 45 tablet 3   triamcinolone cream (KENALOG) 0.5 % Apply 1 application topically 2 (two) times daily as needed (skin irritation/rash).     VASCEPA 1 g capsule Take 2 g by mouth 2 (two) times daily.      No current facility-administered medications for this encounter.    Allergies  Allergen Reactions   Ambien [Zolpidem Tartrate] Anxiety and Other (See Comments)    Causes nightmares   Lisinopril Cough   Brilinta [Ticagrelor] Cough    ? Dyspnea. Changed to Effient 11/2014.    Social History   Socioeconomic History   Marital status: Married    Spouse name: Not on file   Number of children: 2   Years of education: Not on file   Highest  education level: Not on file  Occupational History   Not on file  Tobacco Use   Smoking status: Former    Packs/day: 2.00    Years: 40.00    Pack years: 80.00    Types: Cigarettes    Quit date: 11/10/2014    Years since quitting: 7.1   Smokeless tobacco: Never  Vaping Use   Vaping Use: Never used  Substance and Sexual Activity   Alcohol use: Yes    Alcohol/week: 14.0 standard drinks    Types: 14 Glasses of wine per week    Comment: 02/28/2015 "2, 4oz glasses of wine/night"   Drug use: No   Sexual activity: Not Currently  Other Topics Concern   Not on file  Social History Narrative   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  Now lives in [place].   Social Determinants of Health   Financial Resource Strain: Not on file  Food Insecurity: Not on file  Transportation Needs: Not on file  Physical Activity: Not on file  Stress: Not on file  Social Connections: Not on file  Intimate Partner Violence: Not on file    Family History  Problem Relation Age of Onset   COPD Mother    Hypertension Mother    Hypertension Father    CAD Neg Hx     ROS- All systems are reviewed and negative except as per the HPI above  Physical Exam: There were no vitals filed for this visit. Wt Readings from  Last 3 Encounters:  12/11/21 94.8 kg  11/07/21 94.8 kg  10/18/21 95.3 kg    Labs: Lab Results  Component Value Date   NA 139 12/11/2021   K 4.9 12/11/2021   CL 100 12/11/2021   CO2 21 12/11/2021   GLUCOSE 103 (H) 12/11/2021   BUN 19 12/11/2021   CREATININE 1.29 (H) 12/11/2021   CALCIUM 9.3 12/11/2021   MG 2.1 08/26/2021   Lab Results  Component Value Date   INR 1.17 11/24/2014   Lab Results  Component Value Date   CHOL 127 11/07/2021   HDL 51 11/07/2021   LDLCALC 42 11/07/2021   TRIG 170 (H) 11/07/2021     GEN- The patient is well appearing, alert and oriented x 3 today.   Head- normocephalic, atraumatic Eyes-  Sclera clear, conjunctiva pink Ears- hearing intact Oropharynx- clear Neck- supple, no JVP Lymph- no cervical lymphadenopathy Lungs- Clear to ausculation bilaterally, normal work of breathing Heart- Regular rate and rhythm, no murmurs, rubs or gallops, PMI not laterally displaced GI- soft, NT, ND, + BS Extremities- no clubbing, cyanosis, or edema MS- no significant deformity or atrophy Skin- no rash or lesion Psych- euthymic mood, full affect Neuro- strength and sensation are intact  EKG- Chest CT- IMPRESSION: 1. Lung-RADS 2, benign appearance or behavior. Continue annual screening with low-dose chest CT without contrast in 12 months. 2. Emphysema and aortic atherosclerosis. 3. Coronary artery calcifications. TSH 0.450 - 4.500 uIU/mL 2.550   Cmet 12/11/21- revealed normal liver enzymes   Assessment and Plan:  1. Paroxysmal afib S/p typical atrial flutter ablation 09/04/20 Was on amiodarone/DOAC short term prior to flutter ablation  Discussed with pt to start amiodarone to keep in SR for  LV dysfunction  and inappropriate shock, unsure per Jonni Sanger, if triggered by atrial   or ventricular arhythmia. .  Start amio 200 mg bid after full discussion of risk vrs benefit of drug with pt and wife.  Reduce Toprol back to 25 mg  daily  He used amiodarone  in the  past and tolerated well. TSH will need to be monitored closely as he is  on thyroid replacement   2. CHA2DS2VASc  score of at least 3 Has been restarted on eliquis 5 mg bid  Bleeding precautions discussed   3. LV dysfunction Normovolemic today   4. CAD No anginal symptoms   5. ICD Per device clinic  F/u in afib clinic in one week for EKG on amiodarone and then f/u with Dr. Haroldine Laws 2/14, Dr. Caryl Comes, 2/20.    Geroge Baseman Laisha Rau, [facility] [address] [place], [place] [address] [phone]

## 2022-01-02 ENCOUNTER — Encounter: Payer: Self-pay | Admitting: Internal Medicine

## 2022-01-03 ENCOUNTER — Telehealth: Payer: Self-pay

## 2022-01-03 ENCOUNTER — Ambulatory Visit (HOSPITAL_COMMUNITY)
Admission: RE | Admit: 2022-01-03 | Discharge: 2022-01-03 | Disposition: A | Discharge status: Home / Self Care | Payer: PPO | Source: Ambulatory Visit | Attending: Nurse Practitioner | Admitting: Nurse Practitioner

## 2022-01-03 ENCOUNTER — Other Ambulatory Visit: Payer: Self-pay

## 2022-01-03 DIAGNOSIS — I252 Old myocardial infarction: Secondary | ICD-10-CM | POA: Insufficient documentation

## 2022-01-03 DIAGNOSIS — I472 Ventricular tachycardia, unspecified: Secondary | ICD-10-CM

## 2022-01-03 DIAGNOSIS — R9431 Abnormal electrocardiogram [ECG] [EKG]: Secondary | ICD-10-CM | POA: Insufficient documentation

## 2022-01-03 DIAGNOSIS — Z79899 Other long term (current) drug therapy: Secondary | ICD-10-CM | POA: Diagnosis not present

## 2022-01-03 DIAGNOSIS — R001 Bradycardia, unspecified: Secondary | ICD-10-CM | POA: Insufficient documentation

## 2022-01-03 DIAGNOSIS — I4510 Unspecified right bundle-branch block: Secondary | ICD-10-CM | POA: Insufficient documentation

## 2022-01-03 DIAGNOSIS — I4891 Unspecified atrial fibrillation: Secondary | ICD-10-CM

## 2022-01-03 MED ORDER — METOPROLOL SUCCINATE ER 25 MG PO TB24: 12.5000 mg | ORAL_TABLET | Freq: Every day | ORAL | 3 refills | Status: DC

## 2022-01-03 NOTE — Progress Notes (Signed)
Pt in the afib clinic, 01/03/22, for start of Amiodarone  200 mg bid for ventricular and atrial arrhythmia's. He has not noted any adverse effects from start of drug. I decreased his toprol on start of amiodarone to 25 mg a day from 50 mg daily  but with a HR of 48 today, will reduce  to 12.5 mg daily. He is wanting to know if he can resume driving and I will defer this to Otilio Saber, PA.  to discuss with pt. F/u with Dr. Gala Romney and Dr. Graciela Husbands in February as scheduled.  Vent. rate 48 BPM PR interval 204 ms QRS duration 134 ms QT/QTcB 480/428 ms P-R-T axes 50 54 35 Sinus bradycardia Right bundle branch block Anterolateral infarct , age undetermined Abnormal ECG When compared with ECG of 18-JAN-

## 2022-01-03 NOTE — Patient Instructions (Signed)
Decrease metoprolol to 1/2 tablet once a day (12.5mg )

## 2022-01-03 NOTE — Telephone Encounter (Signed)
-----   Message from Shirley Friar, PA-C sent at 01/03/2022 10:26 AM EST ----- Unfortunately I feel like one of the events on 11/24/2021 was likely VT in addition to the other episodes which were more likely SVT.    So would continue not driving until June.   ----- Message ----- From: Sherran Needs, NP Sent: 01/03/2022   9:41 AM EST To: Shirley Friar, PA-C  Kyle Bullock, he is wanting to know if he can resume driving. Thanks, Butch Penny

## 2022-01-03 NOTE — Telephone Encounter (Signed)
I called pt to give him instructions that we recommend he not drive until June. He said he understood but that he felt like a prisoner.

## 2022-01-06 ENCOUNTER — Ambulatory Visit (HOSPITAL_COMMUNITY): Payer: PPO | Attending: Internal Medicine

## 2022-01-06 ENCOUNTER — Other Ambulatory Visit: Payer: Self-pay

## 2022-01-06 DIAGNOSIS — I519 Heart disease, unspecified: Secondary | ICD-10-CM | POA: Diagnosis not present

## 2022-01-06 DIAGNOSIS — I4901 Ventricular fibrillation: Secondary | ICD-10-CM | POA: Diagnosis not present

## 2022-01-06 DIAGNOSIS — I2510 Atherosclerotic heart disease of native coronary artery without angina pectoris: Secondary | ICD-10-CM | POA: Diagnosis not present

## 2022-01-06 DIAGNOSIS — I361 Nonrheumatic tricuspid (valve) insufficiency: Secondary | ICD-10-CM | POA: Diagnosis not present

## 2022-01-06 DIAGNOSIS — I4510 Unspecified right bundle-branch block: Secondary | ICD-10-CM | POA: Diagnosis not present

## 2022-01-06 DIAGNOSIS — I5021 Acute systolic (congestive) heart failure: Secondary | ICD-10-CM | POA: Diagnosis not present

## 2022-01-06 DIAGNOSIS — Z720 Tobacco use: Secondary | ICD-10-CM | POA: Insufficient documentation

## 2022-01-06 DIAGNOSIS — Z95810 Presence of automatic (implantable) cardiac defibrillator: Secondary | ICD-10-CM | POA: Diagnosis not present

## 2022-01-06 DIAGNOSIS — E785 Hyperlipidemia, unspecified: Secondary | ICD-10-CM | POA: Insufficient documentation

## 2022-01-06 DIAGNOSIS — I252 Old myocardial infarction: Secondary | ICD-10-CM | POA: Insufficient documentation

## 2022-01-06 DIAGNOSIS — I110 Hypertensive heart disease with heart failure: Secondary | ICD-10-CM | POA: Insufficient documentation

## 2022-01-06 LAB — ECHOCARDIOGRAM COMPLETE
MV M vel: 4.15 m/s
MV Peak grad: 68.9 mmHg
S' Lateral: 5 cm

## 2022-01-06 MED ORDER — PERFLUTREN LIPID MICROSPHERE: 1.0000 mL | INTRAVENOUS | Status: AC | PRN

## 2022-01-06 MED ORDER — PERFLUTREN LIPID MICROSPHERE
1.0000 mL | INTRAVENOUS | Status: AC | PRN
  Administered 2022-01-06: 3 mL via INTRAVENOUS

## 2022-01-16 ENCOUNTER — Ambulatory Visit (HOSPITAL_COMMUNITY)
Admission: RE | Admit: 2022-01-16 | Discharge: 2022-01-16 | Disposition: A | Discharge status: Home / Self Care | Payer: PPO | Source: Ambulatory Visit | Attending: Internal Medicine | Admitting: Internal Medicine

## 2022-01-16 ENCOUNTER — Other Ambulatory Visit: Payer: Self-pay

## 2022-01-16 ENCOUNTER — Encounter (HOSPITAL_COMMUNITY): Payer: Self-pay | Admitting: Internal Medicine

## 2022-01-16 VITALS — BP 102/60 | HR 58 | Wt 210.0 lb

## 2022-01-16 DIAGNOSIS — Z8674 Personal history of sudden cardiac arrest: Secondary | ICD-10-CM | POA: Insufficient documentation

## 2022-01-16 DIAGNOSIS — I4891 Unspecified atrial fibrillation: Secondary | ICD-10-CM | POA: Diagnosis not present

## 2022-01-16 DIAGNOSIS — Z79899 Other long term (current) drug therapy: Secondary | ICD-10-CM | POA: Insufficient documentation

## 2022-01-16 DIAGNOSIS — I472 Ventricular tachycardia, unspecified: Secondary | ICD-10-CM

## 2022-01-16 DIAGNOSIS — Z7984 Long term (current) use of oral hypoglycemic drugs: Secondary | ICD-10-CM | POA: Diagnosis not present

## 2022-01-16 DIAGNOSIS — I5022 Chronic systolic (congestive) heart failure: Secondary | ICD-10-CM | POA: Diagnosis not present

## 2022-01-16 DIAGNOSIS — J439 Emphysema, unspecified: Secondary | ICD-10-CM | POA: Insufficient documentation

## 2022-01-16 DIAGNOSIS — I241 Dressler's syndrome: Secondary | ICD-10-CM | POA: Diagnosis not present

## 2022-01-16 DIAGNOSIS — Z7901 Long term (current) use of anticoagulants: Secondary | ICD-10-CM | POA: Diagnosis not present

## 2022-01-16 DIAGNOSIS — I4892 Unspecified atrial flutter: Secondary | ICD-10-CM | POA: Diagnosis not present

## 2022-01-16 DIAGNOSIS — R0683 Snoring: Secondary | ICD-10-CM | POA: Diagnosis not present

## 2022-01-16 DIAGNOSIS — Z955 Presence of coronary angioplasty implant and graft: Secondary | ICD-10-CM | POA: Diagnosis not present

## 2022-01-16 DIAGNOSIS — I255 Ischemic cardiomyopathy: Secondary | ICD-10-CM | POA: Diagnosis not present

## 2022-01-16 DIAGNOSIS — I519 Heart disease, unspecified: Secondary | ICD-10-CM

## 2022-01-16 DIAGNOSIS — Z87891 Personal history of nicotine dependence: Secondary | ICD-10-CM | POA: Insufficient documentation

## 2022-01-16 DIAGNOSIS — I2510 Atherosclerotic heart disease of native coronary artery without angina pectoris: Secondary | ICD-10-CM

## 2022-01-16 DIAGNOSIS — I110 Hypertensive heart disease with heart failure: Secondary | ICD-10-CM | POA: Insufficient documentation

## 2022-01-16 DIAGNOSIS — I471 Supraventricular tachycardia: Secondary | ICD-10-CM

## 2022-01-16 DIAGNOSIS — Z95810 Presence of automatic (implantable) cardiac defibrillator: Secondary | ICD-10-CM | POA: Insufficient documentation

## 2022-01-16 DIAGNOSIS — I252 Old myocardial infarction: Secondary | ICD-10-CM | POA: Insufficient documentation

## 2022-01-16 DIAGNOSIS — Z7982 Long term (current) use of aspirin: Secondary | ICD-10-CM | POA: Diagnosis not present

## 2022-01-16 LAB — COMPREHENSIVE METABOLIC PANEL
ALT: 20 U/L (ref 0–44)
AST: 19 U/L (ref 15–41)
Albumin: 3.8 g/dL (ref 3.5–5.0)
Alkaline Phosphatase: 74 U/L (ref 38–126)
Anion gap: 9 (ref 5–15)
BUN: 23 mg/dL (ref 8–23)
CO2: 25 mmol/L (ref 22–32)
Calcium: 9.3 mg/dL (ref 8.9–10.3)
Chloride: 105 mmol/L (ref 98–111)
Creatinine, Ser: 1.54 mg/dL — ABNORMAL HIGH (ref 0.61–1.24)
GFR, Estimated: 48 mL/min — ABNORMAL LOW (ref >60)
Glucose, Bld: 98 mg/dL (ref 70–99)
Potassium: 4.3 mmol/L (ref 3.5–5.1)
Sodium: 139 mmol/L (ref 135–145)
Total Bilirubin: 0.7 mg/dL (ref 0.3–1.2)
Total Protein: 6.4 g/dL — ABNORMAL LOW (ref 6.5–8.1)

## 2022-01-16 LAB — MAGNESIUM: Magnesium: 2.4 mg/dL (ref 1.7–2.4)

## 2022-01-16 LAB — BRAIN NATRIURETIC PEPTIDE: B Natriuretic Peptide: 513.3 pg/mL — ABNORMAL HIGH (ref 0.0–100.0)

## 2022-01-16 NOTE — Patient Instructions (Signed)
Thank you for your visit today.  There has been no changes to your medications.  Labs done today, your results will be available in MyChart, we will contact you for abnormal readings.  Your physician has requested that you have an echocardiogram. Echocardiography is a painless test that uses sound waves to create images of your heart. It provides your doctor with information about the size and shape of your heart and how well your hearts chambers and valves are working. This procedure takes approximately one hour. There are no restrictions for this procedure.  Your provider has recommended that you have a home sleep study.  We have provided you with the equipment in our office today. Please download the app and follow the instructions. YOUR PIN NUMBER IS: [id number]. Once you have completed the test you just dispose of the equipment, the information is automatically uploaded to [place] via blue-tooth technology. If your test is positive for sleep apnea and you need a home CPAP machine you will be contacted by Dr Norris Cross office [facility]) to set this up. ONCE APPROVED BY YOUR INSURANCE WE WILL CALL YOU TO TAKE THE TEST.  Your physician recommends that you schedule a follow-up appointment in: 6 MONTHS (August 2023) ** please call the office in June to make your follow up appointment**  If you have any questions or concerns before your next appointment please send [place] a message through [employer] or call our office at [phone].    TO LEAVE A MESSAGE FOR THE NURSE SELECT OPTION 2, PLEASE LEAVE A MESSAGE INCLUDING: YOUR NAME DATE OF BIRTH CALL BACK NUMBER REASON FOR CALL**this is important as we prioritize the call backs  YOU WILL RECEIVE A CALL BACK THE SAME DAY AS LONG AS YOU CALL BEFORE 4:00 PM  At the [facility], you and your health needs are our priority. As part of our continuing mission to provide you with exceptional heart care, we have created designated Provider Care  Teams. These Care Teams include your primary Cardiologist (physician) and Advanced Practice Providers (APPs- Physician Assistants and Nurse Practitioners) who all work together to provide you with the care you need, when you need it.   You may see any of the following providers on your designated Care Team at your next follow up: Dr Arvilla Meres Dr Carron Curie, NP Robbie Lis, [place] [facility], [place] Karle Plumber, PharmD   Please be sure to bring in all your medications bottles to every appointment.

## 2022-01-16 NOTE — Progress Notes (Addendum)
Patient ID: Kyle Bullock, male   DOB: [birth year], 71 y.o.   MRN: [id number]    [facility] Clinic Note    History of Present Illness:  Kyle Bullock is a 71 y.o. male who is referred to the HF Clinic by Dr. Burt Knack. He has h/o CAD and ischemic cardiomyopathy 20-25%.  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    In December 2015, he suffered an acute anterolateral MI complicated by VF arrest in [place]. He underwent drug-eluting stent placement to the LAD and PTCA of the diagonal. EF was 25% and a life vest was placed prior to discharge. Following return to [place], he developed dyspnea and presented to [facility] where he was readmitted. His Brilinta was switched to Effient as it was felt the Brilinta may have been playing a role in his dyspnea. He was also switched from an ACE inhibitor to an ARB. He was seen by electrophysiology with recommendation for ongoing life vest therapy and follow-up echo in 90 days post revascularization. He was then seen in clinic in mid December where he reported pleuritic chest pain. He was readmitted and underwent diagnostic catheterization revealing patency of previously placed stent and area of balloon angioplasty in the diagonal. EF was 20-25% with diffuse hypokinesis and normal pericardial appearance. He did have some hypotension requiring discontinuation of ARB therapy. His beta blocker was also down titrated. He was diagnosed with Dressler's syndrome and was placed on prednisone taper and colchicine.  CT with COPD. Referred to Dr. [place] Bells who felt he had mild COPD but no ILD. Given PRN albuterol. Has repeat CT scheduled for 1 year.   Had ATP in 7/22. Found on device interrogation. Seen by EP. According to EP device showed initially irregular arrhythmia concerning for Afib. He then developed a regular tachy arrhythmia (likely VT) which was terminated with ATP. After ATP, his rhythm was again regular. Zio placed showed SVT with  some ectopy. No VT or AF.   Cath in 8/22 for CP with stable CAD.   Had ICD shock 12/29 while he was stacking wood and carrying large loads, when he received an ICD shock. Seen by EP. Felt to have AF/AFL possibly leading to VT. Zio placed showing 5% AF/AFL. Started on amio. Eliquis resumed.   Here for f/u. Feeling much better. Walking 2.5-2.75 miles in 45 mins. Hit 10K steps a few times this week. Denies CP or SOB. No edema orthopnea or PND.   Cardiac studies:   Cath 8/22   Prox RCA to Mid RCA lesion is 55% stenosed.   1st Mrg lesion is 30% stenosed.   Ost LAD to Prox LAD lesion is 30% stenosed.   Previously placed Prox LAD to Dist LAD stent (unknown type) is  widely patent.   Findings:   Ao = 104/57 (77)  LV = 107/7 RA =  3 RV = 23/1 PA = 21/4 (10) PCW = 3 Fick cardiac output/index = 6.0/2.7 PVR = 1.2 WU Ao sat = 99% PA sat = 74%, 74%   Assessment: CAD with widely patent LAD stent Non-obstructive CAD in RCA and LCX with ~ 60% lesion throughout [facility] ICM with EF 25% Low filling pressures with preserved CO    Echo 3/16 EF 25-30% Echo 03/2016 EF 20-25% Echo 9/18 EF 20-25% RV normal   CPX 2/18 FVC 3.49 (72%)      FEV1 2.49 (68%)        FEV1/FVC 71 (93%)  MVV 117 (82%) Resting HR: 62 Peak HR: 148   (97% age predicted max HR) BP rest: 84/60 BP peak: 156/62 Peak VO2: 19.6 (69% predicted peak VO2) VE/VCO2 slope:  37 OUES: 2.14 Peak RER: 1.06 Ventilatory Threshold: 17.4 (61% predicted or measured peak VO2) VE/MVV:  66% O2pulse:  12   (71% predicted O2pulse)  CPX 05/14/2015  Peak VO2: 19.0 (64.5% predicted peak VO2) VE/VCO2 slope:  41.5 OUES: 2.01 Peak RER: 1.06 Ventilatory Threshold: 14.1          (47.9% predicted or measured peak VO2)   Past Medical History:  Diagnosis Date   Abnormal TSH    a. 11/2014: felt d/t sick euthyroid.   AICD (automatic cardioverter/defibrillator) present 02/28/2015   CAD (coronary artery disease)    a. 11/2014: arrest/STEMI  s/p Xience DES to the LAD and PTCA to the D1 ([place]). b. Relook cath 11/2014: no culprit, patent stent.   CHF (congestive heart failure) ([place])    Dressler's syndrome ([place])    a. 11/2014: tx with colchicine and prednisone.   Dyslipidemia    Dyspnea    a. 11/2014: possibly due to combo of CHF and Brilinta. Brilinta changed to Effient.   Erectile dysfunction    a. Pt aware not to take ED med within 24 hr of NTG and vice versa.   Hypertension    Ischemic cardiomyopathy    a. 11/2014: EF reportedly 20% by cath and echo in [place].   NSVT (nonsustained ventricular tachycardia) 11/16/14   a. 11/2014: admitted after discharge from STEMI admission, 18 beats 135-140bpm in ED.   Pericardial effusion    a. 11/2014.   Pericarditis dx'd 11/2014   Pneumonia 1990's X 2   RBBB    Sleep apnea    suspected but not diagnosed (02/28/2015)   STEMI (ST elevation myocardial infarction) ([place]) 11/10/2014   Tobacco abuse    VF (ventricular fibrillation) ([place]) 11/10/14   a. 11/2014: arrest with STEMI.    Past Surgical History:  Procedure Laterality Date   A-FLUTTER ABLATION N/A 09/04/2020   Procedure: A-FLUTTER ABLATION;  Surgeon: Thompson Grayer, MD;  Location: [facility];  Service: Cardiovascular;  Laterality: N/A;   CARDIAC CATHETERIZATION  11/19/2014   CARDIAC DEFIBRILLATOR PLACEMENT  02/28/2015   CORONARY ANGIOPLASTY WITH STENT PLACEMENT  11/10/2014   LAD DES   HAND SURGERY Bilateral    Dupuytren's Contracture   IMPLANTABLE CARDIOVERTER DEFIBRILLATOR IMPLANT N/A 02/28/2015   SJM Fortify Assura VR ICD implanted by Dr Rayann Heman.  Revascularized following VF arrest, EF remained depressed and ICD implanted   KNEE ARTHROSCOPY Right 1982   LEFT HEART CATH N/A 11/19/2014   Procedure: LEFT HEART CATH;  Surgeon: Lorretta Harp, MD;  Location: [facility] CATH LAB;  Service: Cardiovascular;  Laterality: N/A;   RIGHT/LEFT HEART CATH AND CORONARY ANGIOGRAPHY N/A 07/22/2021   Procedure: RIGHT/LEFT HEART CATH AND  CORONARY ANGIOGRAPHY;  Surgeon: Jolaine Artist, MD;  Location: [place] [facility];  Service: Cardiovascular;  Laterality: N/A;    Current Outpatient Medications  Medication Sig Dispense Refill   acetaminophen (TYLENOL) 500 MG tablet Take 500 mg by mouth as needed for moderate pain.     amiodarone (PACERONE) 200 MG tablet Take 1 tablet by mouth twice a day for one month then reduce to 1 tablet daily 60 tablet 0   ANORO ELLIPTA 62.5-25 MCG/INH AEPB Inhale 1 puff into the lungs in the morning.  11   apixaban (ELIQUIS) 5 MG TABS tablet Take 1 tablet (  5 mg total) by mouth 2 (two) times daily. 60 tablet 6   aspirin EC 81 MG tablet Take 1 tablet (81 mg total) by mouth daily. Swallow whole. 90 tablet 3   atorvastatin (LIPITOR) 20 MG tablet TAKE 1 TABLET BY MOUTH DAILY AT 6 PM 90 tablet 3   colchicine 0.6 MG tablet Take 0.6 mg by mouth daily as needed (gout flare).      empagliflozin (JARDIANCE) 10 MG TABS tablet Take 1 tablet (10 mg total) by mouth daily before breakfast. 90 tablet 3   furosemide (LASIX) 20 MG tablet Take 20 mg by mouth as needed.     icosapent Ethyl (VASCEPA) 1 g capsule Take 2 g by mouth daily.     levothyroxine (SYNTHROID) 75 MCG tablet Take 75 mcg by mouth daily before breakfast.     metoprolol succinate (TOPROL-XL) 25 MG 24 hr tablet Take 0.5 tablets (12.5 mg total) by mouth daily. Take with or immediately following a meal. 90 tablet 3   nitroGLYCERIN (NITROSTAT) 0.4 MG SL tablet Place 1 tablet (0.4 mg total) under the tongue every 5 (five) minutes as needed for chest pain (MAX 3 TABLETS). 10 tablet 3   sacubitril-valsartan (ENTRESTO) 24-26 MG Take 1 tablet by mouth 2 (two) times daily. 180 tablet 3   spironolactone (ALDACTONE) 25 MG tablet Take 0.5 tablets (12.5 mg total) by mouth daily. 45 tablet 3   triamcinolone cream (KENALOG) 0.5 % Apply 1 application topically 2 (two) times daily as needed (skin irritation/rash).     No current facility-administered medications for  this encounter.    Allergies:   Ambien [zolpidem tartrate], Lisinopril, and Brilinta [ticagrelor]   Social History:  The patient  reports that he quit smoking about 7 years ago. His smoking use included cigarettes. He has a 80.00 pack-year smoking history. He has never used smokeless tobacco. He reports current alcohol use of about 14.0 standard drinks per week. He reports that he does not use drugs.   Family History:  The patient's  family history includes COPD in his mother; Hypertension in his father and mother.   Review of systems complete and found to be negative unless listed in HPI.     Vitals:   01/16/22 1540  BP: 102/60  Pulse: (!) 58  SpO2: 94%  Weight: 95.3 kg (210 lb)    Wt Readings from Last 3 Encounters:  01/16/22 95.3 kg (210 lb)  12/25/21 94.7 kg (208 lb 12.8 oz)  12/11/21 94.8 kg (209 lb)   Physical exam: General:  Well appearing. No resp difficulty HEENT: normal Neck: supple. no JVD. Carotids 2+ bilat; no bruits. No lymphadenopathy or thryomegaly appreciated. Cor: PMI nondisplaced. Regular rate & rhythm. No rubs, gallops or murmurs. Lungs: clear Abdomen: soft, nontender, nondistended. No hepatosplenomegaly. No bruits or masses. Good bowel sounds. Extremities: no cyanosis, clubbing, rash, edema Neuro: alert & orientedx3, cranial nerves grossly intact. moves all 4 extremities w/o difficulty. Affect pleasant  Recent Labs: 08/26/2021: Magnesium 2.1 11/07/2021: B Natriuretic Peptide 463.9 12/11/2021: ALT 13; BUN 19; Creatinine, Ser 1.29; Hemoglobin 15.6; Platelets 309; Potassium 4.9; Sodium 139; TSH 2.550   Lipid Panel     Component Value Date/Time   CHOL 127 11/07/2021 1212   CHOL 101 12/04/2014 1030   TRIG 170 (H) 11/07/2021 1212   HDL 51 11/07/2021 1212   HDL 43 12/04/2014 1030   CHOLHDL 2.5 11/07/2021 1212   VLDL 34 11/07/2021 1212   LDLCALC 42 11/07/2021 1212   LDLCALC 39 12/04/2014 1030  ASSESSMENT AND PLAN:  1. Chronic systolic due to  ischemic CM  - Echo 03/2016 LVEF 20-25%, Grade 2 DD - Echo 3/18 shows with stable LVEF 20-25%. Normal RV.  - Echo 9/18 EF 20-25%, grade 2 DD  - Echo 05/16/20  EF 25% RV normal. - Doing well. Stable NYHA II symptoms - Volume status stable on lasix 20 mg qod - Continue Entresto 24/26.  Up-titration Limited by low BP  - Continue carvedilol 6.25 mg BID.  - Continue sprio 25 mg daily.   - Continue Farxiga 10 - ICD interrogated personally. No recurrent VT since 12/22. Optivol was up now back down  - Check labs today - Repeat echo next visit  2.  CAD s/p Anterior MI December 2015:  - Cath 8/22 with stable CAD - No s/s angina - Continue ASA, statin and Effient  - Lipids per Dr. Brigitte Pulse. Will redraw today  3. AF/AFL - seen on zio 5% AF burden - now on amio/eliqus - followed by AF Clinic/EP  4. H/o VT - had SVT/VT in 12/22 - I d/w EP - Device reprogrammed. Continue amio   5. Tobacco abuse:  - Remains quit  6. Emphysema - Sees pulmonary. Spirometry on CPX previously reviewed with only mild disease.   7. Snoring - Check sleep study    Glori Bickers, MD  4:13 PM

## 2022-01-17 ENCOUNTER — Other Ambulatory Visit (HOSPITAL_COMMUNITY): Payer: Self-pay | Admitting: Physician Assistant

## 2022-01-17 ENCOUNTER — Encounter (HOSPITAL_COMMUNITY): Payer: Self-pay

## 2022-01-17 NOTE — Progress Notes (Unsigned)
VM left on patient's home phone.Letting patient know that he can do [place] no pre authorization needed

## 2022-01-18 ENCOUNTER — Other Ambulatory Visit (HOSPITAL_COMMUNITY): Payer: Self-pay | Admitting: Internal Medicine

## 2022-01-18 NOTE — Addendum Note (Signed)
Encounter addended by: Dolores Patty, MD on: 01/18/2022 7:53 PM  Actions taken: Clinical Note Signed

## 2022-01-21 ENCOUNTER — Encounter (HOSPITAL_COMMUNITY): Payer: PPO | Admitting: Internal Medicine

## 2022-01-23 DIAGNOSIS — E785 Hyperlipidemia, unspecified: Secondary | ICD-10-CM | POA: Diagnosis not present

## 2022-01-23 DIAGNOSIS — Z125 Encounter for screening for malignant neoplasm of prostate: Secondary | ICD-10-CM | POA: Diagnosis not present

## 2022-01-23 DIAGNOSIS — I10 Essential (primary) hypertension: Secondary | ICD-10-CM | POA: Diagnosis not present

## 2022-01-23 DIAGNOSIS — R7301 Impaired fasting glucose: Secondary | ICD-10-CM | POA: Diagnosis not present

## 2022-01-23 DIAGNOSIS — Z95810 Presence of automatic (implantable) cardiac defibrillator: Secondary | ICD-10-CM | POA: Insufficient documentation

## 2022-01-23 DIAGNOSIS — E039 Hypothyroidism, unspecified: Secondary | ICD-10-CM | POA: Diagnosis not present

## 2022-01-23 DIAGNOSIS — M109 Gout, unspecified: Secondary | ICD-10-CM | POA: Diagnosis not present

## 2022-01-27 ENCOUNTER — Ambulatory Visit: Payer: PPO | Admitting: Internal Medicine

## 2022-01-27 ENCOUNTER — Encounter: Payer: Self-pay | Admitting: Internal Medicine

## 2022-01-27 ENCOUNTER — Other Ambulatory Visit: Payer: Self-pay

## 2022-01-27 VITALS — BP 100/60 | HR 58 | Ht 75.0 in | Wt 209.2 lb

## 2022-01-27 DIAGNOSIS — Z95810 Presence of automatic (implantable) cardiac defibrillator: Secondary | ICD-10-CM | POA: Diagnosis not present

## 2022-01-27 DIAGNOSIS — I4729 Other ventricular tachycardia: Secondary | ICD-10-CM

## 2022-01-27 DIAGNOSIS — I4901 Ventricular fibrillation: Secondary | ICD-10-CM | POA: Diagnosis not present

## 2022-01-27 NOTE — Progress Notes (Signed)
Patient Care Team: Ginger Organ., MD as PCP - General (Internal Medicine) Thompson Grayer, MD as PCP - Electrophysiology (Cardiology)   HPI  Kyle Bullock is a 71 y.o. male seen in follow-up for an ICD implanted 2016 [employer] for primary prevention in the setting of ischemic heart disease  History of appropriate therapy with ventricular tachycardia 7/22 treated with antitachycardia pacing, thought to be a dual tachycardia and then subsequently 12/22 at ICD shock in the context of atrial fibrillation again thought contributing to ventricular tachycardia.  History of atrial fibrillation associated with a rapid ventricular rate up to 178..  As noted.  Recent event recorder  5% burden  \Recently started on amiodarone  Date Cr K Hgb BNP TSH LFTs  9/22 1.06 4.6       2/23 1.54 4.3 15.6 513 2.55 20    DATE TEST EF   12/15   25 %   9/18 Echo   20-25 %   8/22 LHC  LAD Stent patent OM1 30; RCAp-m 55%  1/23 Echo  25%             Records and Results Reviewed   Past Medical History:  Diagnosis Date   Abnormal TSH    a. 11/2014: felt d/t sick euthyroid.   AICD (automatic cardioverter/defibrillator) present 02/28/2015   CAD (coronary artery disease)    a. 11/2014: arrest/STEMI s/p Xience DES to the LAD and PTCA to the D1 ([place]). b. Relook cath 11/2014: no culprit, patent stent.   CHF (congestive heart failure) ([place])    Dressler's syndrome ([place])    a. 11/2014: tx with colchicine and prednisone.   Dyslipidemia    Dyspnea    a. 11/2014: possibly due to combo of CHF and Brilinta. Brilinta changed to Effient.   Erectile dysfunction    a. Pt aware not to take ED med within 24 hr of NTG and vice versa.   Hypertension    Ischemic cardiomyopathy    a. 11/2014: EF reportedly 20% by cath and echo in [place].   NSVT (nonsustained ventricular tachycardia) 11/16/14   a. 11/2014: admitted after discharge from STEMI admission, 18 beats 135-140bpm in ED.   Pericardial  effusion    a. 11/2014.   Pericarditis dx'd 11/2014   Pneumonia 1990's X 2   RBBB    Sleep apnea    suspected but not diagnosed (02/28/2015)   STEMI (ST elevation myocardial infarction) ([place]) 11/10/2014   Tobacco abuse    VF (ventricular fibrillation) ([place]) 11/10/14   a. 11/2014: arrest with STEMI.    Past Surgical History:  Procedure Laterality Date   A-FLUTTER ABLATION N/A 09/04/2020   Procedure: A-FLUTTER ABLATION;  Surgeon: Thompson Grayer, MD;  Location: [place] [facility];  Service: Cardiovascular;  Laterality: N/A;   CARDIAC CATHETERIZATION  11/19/2014   CARDIAC DEFIBRILLATOR PLACEMENT  02/28/2015   CORONARY ANGIOPLASTY WITH STENT PLACEMENT  11/10/2014   LAD DES   HAND SURGERY Bilateral    Dupuytren's Contracture   IMPLANTABLE CARDIOVERTER DEFIBRILLATOR IMPLANT N/A 02/28/2015   SJM Fortify Assura VR ICD implanted by Dr Rayann Heman.  Revascularized following VF arrest, EF remained depressed and ICD implanted   KNEE ARTHROSCOPY Right 1982   LEFT HEART CATH N/A 11/19/2014   Procedure: LEFT HEART CATH;  Surgeon: Lorretta Harp, MD;  Location: [facility] CATH LAB;  Service: Cardiovascular;  Laterality: N/A;   RIGHT/LEFT HEART CATH AND CORONARY ANGIOGRAPHY N/A 07/22/2021   Procedure: RIGHT/LEFT HEART CATH AND  CORONARY ANGIOGRAPHY;  Surgeon: Jolaine Artist, MD;  Location: [place] [facility];  Service: Cardiovascular;  Laterality: N/A;    Current Meds  Medication Sig   acetaminophen (TYLENOL) 500 MG tablet Take 500 mg by mouth as needed for moderate pain.   amiodarone (PACERONE) 200 MG tablet Take 1 tablet (200 mg total) by mouth daily.   ANORO ELLIPTA 62.5-25 MCG/INH AEPB Inhale 1 puff into the lungs in the morning.   apixaban (ELIQUIS) 5 MG TABS tablet Take 1 tablet (5 mg total) by mouth 2 (two) times daily.   aspirin EC 81 MG tablet Take 1 tablet (81 mg total) by mouth daily. Swallow whole.   atorvastatin (LIPITOR) 20 MG tablet TAKE 1 TABLET BY MOUTH DAILY AT 6 PM   colchicine 0.6 MG  tablet Take 0.6 mg by mouth daily as needed (gout flare).    empagliflozin (JARDIANCE) 10 MG TABS tablet Take 1 tablet (10 mg total) by mouth daily before breakfast.   furosemide (LASIX) 20 MG tablet Take 20 mg by mouth as needed.   icosapent Ethyl (VASCEPA) 1 g capsule Take 2 g by mouth daily.   levothyroxine (SYNTHROID) 75 MCG tablet Take 75 mcg by mouth daily before breakfast.   metoprolol succinate (TOPROL-XL) 25 MG 24 hr tablet Take 0.5 tablets (12.5 mg total) by mouth daily. Take with or immediately following a meal.   nitroGLYCERIN (NITROSTAT) 0.4 MG SL tablet PLACE 1 TABLET BY MOUTH UNDER TONGUE EVERY 5 MINUTES AS NEEDED CHEST PAIN   sacubitril-valsartan (ENTRESTO) 24-26 MG Take 1 tablet by mouth 2 (two) times daily.   spironolactone (ALDACTONE) 25 MG tablet Take 0.5 tablets (12.5 mg total) by mouth daily.   triamcinolone cream (KENALOG) 0.5 % Apply 1 application topically 2 (two) times daily as needed (skin irritation/rash).    Allergies  Allergen Reactions   Ambien [Zolpidem Tartrate] Anxiety and Other (See Comments)    Causes nightmares   Lisinopril Cough   Brilinta [Ticagrelor] Cough    ? Dyspnea. Changed to Effient 11/2014.      Review of Systems negative except from HPI and PMH  Physical Exam BP 100/60    Pulse (!) 58    Ht 6\' 3"  (1.905 m)    Wt 209 lb 3.2 oz (94.9 kg)    SpO2 99%    BMI 26.15 kg/m  Well developed and well nourished in no acute distress HENT normal E scleral and icterus clear Neck Supple JVP flat; carotids brisk and full Clear to ausculation Regular rate and rhythm, no murmurs gallops or rub Soft with active bowel sounds No clubbing cyanosis  Edema Alert and oriented, grossly normal motor and sensory function Skin Warm and Dry  ECG sinus at 58 Interval 21/14/48 Right bundle branch block left axis deviation  Estimated Creatinine Clearance: 51.8 mL/min (A) (by C-G formula based on SCr of 1.54 mg/dL (H)).   Assessment and  Plan  Ischemic  cardiomyopathy  VT  ATP treated  Sinus bradycardia  CHF  Afib  dur < 6 hrs    Patient has had ventricular tachycardia occurring in the context of rapid atrial fibrillation.  It was appropriately terminated with ATP; however, he then underwent a shock because of failure to degree detect none ventricular rhythms.  His device was reprogrammed to shorten the detection to 3 beats.  We crafted 3 VT zones [phone] with ATP and shocks in the first 2 zones.  We discussed turning off the shocks and VT 1 but decided to  leave them on.  We decreased the VT detection from 214--180 as his VT cycle length was about 280 ms and we would anticipate some degree of slowing with the amiodarone.  Tolerating the amiodarone thus far.  We will continue at 200 mg daily.   plan to see him in 3 months time for amiodarone surveillance laboratories.  He has sinus bradycardia.  We will have to see whether the amiodarone is significantly problematic in this regard.   Given the recurrence of atrial fibrillation and its potential complications associate with treatment including the bradycardia as noted above and its association with triggering ventricular tachycardia I have suggested that we consider catheter ablation of his atrial fibrillation.  Prior history of atrial flutter ablation with Dr. Greggory Brandy.  We will refer him to Dr. Daune Perch.     Will have him discontinue ASA and continue him on Eliquis 5 mg twice daily    84 min was spent in care of the patient including the review of records   Current medicines are reviewed at length with the patient today .  The patient does not have concerns regarding medicines.

## 2022-01-27 NOTE — Patient Instructions (Signed)
Medication Instructions:  Your physician recommends that you continue on your current medications as directed. Please refer to the Current Medication list given to you today.  *If you need a refill on your cardiac medications before your next appointment, please call your pharmacy*   Lab Work: None ordered.  If you have labs (blood work) drawn today and your tests are completely normal, you will receive your results only by: MyChart Message (if you have MyChart) OR A paper copy in the mail If you have any lab test that is abnormal or we need to change your treatment, we will call you to review the results.   Testing/Procedures: None ordered.    Follow-Up: At [facility], you and your health needs are our priority.  As part of our continuing mission to provide you with exceptional heart care, we have created designated Provider Care Teams.  These Care Teams include your primary Cardiologist (physician) and Advanced Practice Providers (APPs -  Physician Assistants and Nurse Practitioners) who all work together to provide you with the care you need, when you need it.  We recommend signing up for the patient portal called "MyChart".  Sign up information is provided on this After Visit Summary.  MyChart is used to connect with patients for Virtual Visits (Telemedicine).  Patients are able to view lab/test results, encounter notes, upcoming appointments, etc.  Non-urgent messages can be sent to your provider as well.   To learn more about what you can do with MyChart, go to [email or web address].    Your next appointment:   Dr Steffanie Dunn 04/16/2022 at 345pm  Otilio Saber, PA-C at 11am

## 2022-01-30 DIAGNOSIS — I700 Atherosclerosis of aorta: Secondary | ICD-10-CM | POA: Diagnosis not present

## 2022-01-30 DIAGNOSIS — N1831 Chronic kidney disease, stage 3a: Secondary | ICD-10-CM | POA: Diagnosis not present

## 2022-01-30 DIAGNOSIS — D6869 Other thrombophilia: Secondary | ICD-10-CM | POA: Diagnosis not present

## 2022-01-30 DIAGNOSIS — Z1339 Encounter for screening examination for other mental health and behavioral disorders: Secondary | ICD-10-CM | POA: Diagnosis not present

## 2022-01-30 DIAGNOSIS — I130 Hypertensive heart and chronic kidney disease with heart failure and stage 1 through stage 4 chronic kidney disease, or unspecified chronic kidney disease: Secondary | ICD-10-CM | POA: Diagnosis not present

## 2022-01-30 DIAGNOSIS — Z1331 Encounter for screening for depression: Secondary | ICD-10-CM | POA: Diagnosis not present

## 2022-01-30 DIAGNOSIS — E039 Hypothyroidism, unspecified: Secondary | ICD-10-CM | POA: Diagnosis not present

## 2022-01-30 DIAGNOSIS — E781 Pure hyperglyceridemia: Secondary | ICD-10-CM | POA: Diagnosis not present

## 2022-01-30 DIAGNOSIS — I483 Typical atrial flutter: Secondary | ICD-10-CM | POA: Diagnosis not present

## 2022-01-30 DIAGNOSIS — I272 Pulmonary hypertension, unspecified: Secondary | ICD-10-CM | POA: Diagnosis not present

## 2022-01-30 DIAGNOSIS — I472 Ventricular tachycardia, unspecified: Secondary | ICD-10-CM | POA: Diagnosis not present

## 2022-01-30 DIAGNOSIS — I2510 Atherosclerotic heart disease of native coronary artery without angina pectoris: Secondary | ICD-10-CM | POA: Diagnosis not present

## 2022-01-30 DIAGNOSIS — I5022 Chronic systolic (congestive) heart failure: Secondary | ICD-10-CM | POA: Diagnosis not present

## 2022-01-30 DIAGNOSIS — Z0000 Encounter for general adult medical examination without abnormal findings: Secondary | ICD-10-CM | POA: Diagnosis not present

## 2022-01-30 DIAGNOSIS — I255 Ischemic cardiomyopathy: Secondary | ICD-10-CM | POA: Diagnosis not present

## 2022-01-30 DIAGNOSIS — Z1211 Encounter for screening for malignant neoplasm of colon: Secondary | ICD-10-CM | POA: Diagnosis not present

## 2022-02-12 DIAGNOSIS — L718 Other rosacea: Secondary | ICD-10-CM | POA: Diagnosis not present

## 2022-02-12 DIAGNOSIS — L821 Other seborrheic keratosis: Secondary | ICD-10-CM | POA: Diagnosis not present

## 2022-02-12 DIAGNOSIS — L728 Other follicular cysts of the skin and subcutaneous tissue: Secondary | ICD-10-CM | POA: Diagnosis not present

## 2022-02-14 ENCOUNTER — Other Ambulatory Visit (HOSPITAL_COMMUNITY): Payer: Self-pay | Admitting: Internal Medicine

## 2022-02-17 ENCOUNTER — Encounter: Payer: Self-pay | Admitting: Internal Medicine

## 2022-02-17 DIAGNOSIS — L309 Dermatitis, unspecified: Secondary | ICD-10-CM | POA: Diagnosis not present

## 2022-02-17 DIAGNOSIS — K644 Residual hemorrhoidal skin tags: Secondary | ICD-10-CM | POA: Diagnosis not present

## 2022-02-17 DIAGNOSIS — B356 Tinea cruris: Secondary | ICD-10-CM | POA: Diagnosis not present

## 2022-03-04 ENCOUNTER — Ambulatory Visit (INDEPENDENT_AMBULATORY_CARE_PROVIDER_SITE_OTHER): Payer: PPO

## 2022-03-04 DIAGNOSIS — I255 Ischemic cardiomyopathy: Secondary | ICD-10-CM

## 2022-03-04 LAB — CUP PACEART REMOTE DEVICE CHECK
Battery Remaining Longevity: 44 mo
Battery Remaining Percentage: 42 %
Battery Voltage: 2.98 V
Brady Statistic RV Percent Paced: 1 %
Date Time Interrogation Session: 20230328020016
HighPow Impedance: 72 Ohm
HighPow Impedance: 72 Ohm
Implantable Lead Implant Date: 20160323
Implantable Lead Location: 753860
Implantable Pulse Generator Implant Date: 20160323
Lead Channel Impedance Value: 360 Ohm
Lead Channel Pacing Threshold Amplitude: 0.75 V
Lead Channel Pacing Threshold Pulse Width: 0.5 ms
Lead Channel Sensing Intrinsic Amplitude: 12 mV
Lead Channel Setting Pacing Amplitude: 2.5 V
Lead Channel Setting Pacing Pulse Width: 0.5 ms
Lead Channel Setting Sensing Sensitivity: 0.5 mV
Pulse Gen Serial Number: 7255600

## 2022-03-07 DIAGNOSIS — E785 Hyperlipidemia, unspecified: Secondary | ICD-10-CM | POA: Diagnosis not present

## 2022-03-07 DIAGNOSIS — I130 Hypertensive heart and chronic kidney disease with heart failure and stage 1 through stage 4 chronic kidney disease, or unspecified chronic kidney disease: Secondary | ICD-10-CM | POA: Diagnosis not present

## 2022-03-07 DIAGNOSIS — I5022 Chronic systolic (congestive) heart failure: Secondary | ICD-10-CM | POA: Diagnosis not present

## 2022-03-07 DIAGNOSIS — N1831 Chronic kidney disease, stage 3a: Secondary | ICD-10-CM | POA: Diagnosis not present

## 2022-03-17 NOTE — Progress Notes (Signed)
Remote ICD transmission.   

## 2022-03-27 DIAGNOSIS — Z1211 Encounter for screening for malignant neoplasm of colon: Secondary | ICD-10-CM | POA: Diagnosis not present

## 2022-04-01 ENCOUNTER — Other Ambulatory Visit: Payer: Self-pay

## 2022-04-02 MED ORDER — ATORVASTATIN CALCIUM 20 MG PO TABS: ORAL_TABLET | ORAL | 3 refills | Status: DC

## 2022-04-15 NOTE — Progress Notes (Deleted)
?Electrophysiology Office Note:   ? ?Date:  04/15/2022  ? ?ID:  [name], DOB 03/07/1951, MRN [id number] ? ?PCP:  Ginger Organ., MD  ?[place] Cardiologist:  None  ?[facility] Electrophysiologist:  Thompson Grayer, MD  ? ?Referring MD: Ginger Organ., MD  ? ?Chief Complaint: AF ? ?History of Present Illness:   ? ?Kyle Bullock is a 71 y.o. male who presents for an evaluation of AF at the request of Dr Caryl Comes. Their medical history includes VT s/p ICD in 2016, AF, CAD, HTN, NSVT, tobacco abuse. He has received a shock in the past for a dual tachycardia. After, he was started on amiodarone. He saw Dr Caryl Comes 01/27/2022 and was tolerating amiodarone. Given the assocation of his AF w/ VT, AF ablation was recommended. He had previously undergone AFL ablation with Dr Rayann Heman.  ? ? ?  ?Past Medical History:  ?Diagnosis Date  ? Abnormal TSH   ? a. 11/2014: felt d/t sick euthyroid.  ? AICD (automatic cardioverter/defibrillator) present 02/28/2015  ? CAD (coronary artery disease)   ? a. 11/2014: arrest/STEMI s/p Xience DES to the LAD and PTCA to the D1 ([place]). b. Relook cath 11/2014: no culprit, patent stent.  ? CHF (congestive heart failure) ([place])   ? 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 a. 11/2014: tx with colchicine and prednisone.  ? Dyslipidemia   ? Dyspnea   ? a. 11/2014: possibly due to combo of CHF and Brilinta. Brilinta changed to Effient.  ? Erectile dysfunction   ? a. Pt aware not to take ED med within 24 hr of NTG and vice versa.  ? Hypertension   ? Ischemic cardiomyopathy   ? a. 11/2014: EF reportedly 20% by cath and echo in [place].  ? NSVT (nonsustained ventricular tachycardia) 11/16/14  ? a. 11/2014: admitted after discharge from STEMI admission, 18 beats 135-140bpm in ED.  ? Pericardial effusion   ? a. 11/2014.  ? Pericarditis dx'd 11/2014  ? Pneumonia 1990's X 2  ? RBBB   ? Sleep apnea   ? suspected but not diagnosed (02/28/2015)  ? STEMI (ST elevation myocardial infarction) ([place])  11/10/2014  ? Tobacco abuse   ? VF (ventricular fibrillation) ([place]) 11/10/14  ? a. 11/2014: arrest with STEMI.  ? ? ?Past Surgical History:  ?Procedure Laterality Date  ? A-FLUTTER ABLATION N/A 09/04/2020  ? Procedure: A-FLUTTER ABLATION;  Surgeon: Thompson Grayer, MD;  Location: [facility];  Service: Cardiovascular;  Laterality: N/A;  ? CARDIAC CATHETERIZATION  11/19/2014  ? CARDIAC DEFIBRILLATOR PLACEMENT  02/28/2015  ? CORONARY ANGIOPLASTY WITH STENT PLACEMENT  11/10/2014  ? LAD DES  ? HAND SURGERY Bilateral   ? Dupuytren's Contracture  ? IMPLANTABLE CARDIOVERTER DEFIBRILLATOR IMPLANT N/A 02/28/2015  ? SJM Fortify Assura VR ICD implanted by Dr Rayann Heman.  Revascularized following VF arrest, EF remained depressed and ICD implanted  ? KNEE ARTHROSCOPY Right 1982  ? LEFT HEART CATH N/A 11/19/2014  ? Procedure: LEFT HEART CATH;  Surgeon: Lorretta Harp, MD;  [facility]: [facility];  Service: Cardiovascular;  Laterality: N/A;  ? RIGHT/LEFT HEART CATH AND CORONARY ANGIOGRAPHY N/A 07/22/2021  ? Procedure: RIGHT/LEFT HEART CATH AND CORONARY ANGIOGRAPHY;  Surgeon: Jolaine Artist, MD;  Location: [place];  Service: Cardiovascular;  Laterality: N/A;  ? ? ?Current Medications: ?No outpatient medications have been marked as taking for the 04/16/22 encounter (Appointment) with Vickie Epley, MD.  ?  ? ?Allergies:   Ambien [zolpidem  tartrate], Lisinopril, and Brilinta [ticagrelor]  ? ?Social History  ? ?Socioeconomic History  ? Marital status: Married  ?  Spouse name: Not on file  ? Number of children: 2  ? Years of education: Not on file  ? Highest education level: Not on file  ?Occupational History  ? Not on file  ?Tobacco Use  ? Smoking status: Former  ?  Packs/day: 2.00  ?  Years: 40.00  ?  Pack years: 80.00  ?  Types: Cigarettes  ?  Quit date: 11/10/2014  ?  Years since quitting: 7.4  ? Smokeless tobacco: Never  ?Vaping Use  ? Vaping Use: Never used  ?Substance and Sexual Activity  ? Alcohol use: Yes  ?   Alcohol/week: 14.0 standard drinks  ?  Types: 14 Glasses of wine per week  ?  Comment: 02/28/2015 "2, 4oz glasses of wine/night"  ? Drug use: No  ? Sexual activity: Not Currently  ?Other Topics Concern  ? Not on file  ?Social History Narrative  ? 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  Now lives in [place].  ? ?Social Determinants of Health  ? ?Financial Resource Strain: Not on file  ?Food Insecurity: Not on file  ?Transportation Needs: Not on file  ?Physical Activity: Not on file  ?Stress: Not on file  ?Social Connections: Not on file  ?  ? ?Family History: ?The patient's family history includes COPD in his mother; Hypertension in his father and mother. There is no history of CAD. ? ?ROS:   ?Please see the history of present illness.    ?All other systems reviewed and are negative. ? ?EKGs/Labs/Other Studies Reviewed:   ? ?The following studies were reviewed today: ? ?01/06/2022 Echo ?EF 25% ?RV normal ?LA mildly dilated ?No MR ? ? ?EKG:  The ekg ordered today demonstrates *** ? ? ?Recent Labs: ?12/11/2021: Hemoglobin 15.6; Platelets 309; TSH 2.550 ?01/16/2022: ALT 20; B Natriuretic Peptide 513.3; BUN 23; Creatinine, Ser 1.54; Magnesium 2.4; Potassium 4.3; Sodium 139  ?Recent Lipid Panel ?   ?Component Value Date/Time  ? CHOL 127 11/07/2021 1212  ? CHOL 101 12/04/2014 1030  ? TRIG 170 (H) 11/07/2021 1212  ? HDL 51 11/07/2021 1212  ? HDL 43 12/04/2014 1030  ? CHOLHDL 2.5 11/07/2021 1212  ? VLDL 34 11/07/2021 1212  ? LDLCALC 42 11/07/2021 1212  ? LDLCALC 39 12/04/2014 1030  ? ? ?Physical Exam:   ? ?VS:  There were no vitals taken for this visit.   ? ?Wt Readings from Last 3 Encounters:  ?01/27/22 209 lb 3.2 oz (94.9 kg)  ?01/16/22 210 lb (95.3 kg)  ?12/25/21 208 lb 12.8 oz (94.7 kg)  ?  ? ?GEN: *** Well nourished, well developed in no acute distress ?HEENT: Normal ?NECK: No JVD; No carotid bruits ?LYMPHATICS: No lymphadenopathy ?CARDIAC: ***RRR, no murmurs, rubs, gallops ?RESPIRATORY:  Clear to  auscultation without rales, wheezing or rhonchi  ?ABDOMEN: Soft, non-tender, non-distended ?MUSCULOSKELETAL:  No edema; No deformity  ?SKIN: Warm and dry ?NEUROLOGIC:  Alert and oriented x 3 ?PSYCHIATRIC:  Normal affect  ? ? ?  ? ?ASSESSMENT:   ? ?No diagnosis found. ?PLAN:   ? ?In order of problems listed above: ? ? ?PVI and posterior wall and check CTI ? ? ? ? ? ?Total time spent with patient today *** minutes. This includes reviewing records, evaluating the patient and coordinating care. ? ?Medication Adjustments/Labs and Tests Ordered: ?Current medicines are reviewed at length with the patient today.  Concerns regarding medicines are outlined above.  ?No orders of the defined types were placed in this encounter. ? ?No orders of the defined types were placed in this encounter. ? ? ? ?Signed, ?Lysbeth Galas T. Quentin Ore, MD, [facility], [place] ?04/15/2022 10:49 PM    ?Electrophysiology ?[place] ?

## 2022-04-16 ENCOUNTER — Encounter: Payer: Self-pay | Admitting: Cardiology

## 2022-04-16 ENCOUNTER — Ambulatory Visit: Payer: PPO | Admitting: Cardiology

## 2022-04-16 VITALS — BP 90/56 | HR 54 | Ht 75.0 in | Wt 214.0 lb

## 2022-04-16 DIAGNOSIS — I5022 Chronic systolic (congestive) heart failure: Secondary | ICD-10-CM | POA: Diagnosis not present

## 2022-04-16 DIAGNOSIS — I4891 Unspecified atrial fibrillation: Secondary | ICD-10-CM

## 2022-04-16 DIAGNOSIS — Z95810 Presence of automatic (implantable) cardiac defibrillator: Secondary | ICD-10-CM

## 2022-04-16 DIAGNOSIS — I483 Typical atrial flutter: Secondary | ICD-10-CM

## 2022-04-16 NOTE — Progress Notes (Signed)
?Electrophysiology Office Note:   ? ?Date:  04/16/2022  ? ?ID:  Kyle Bullock, DOB 11-08-1951, MRN [id number] ? ?PCP:  Ginger Organ., MD  ?[place] Cardiologist:  None  ?[facility] Electrophysiologist:  Vickie Epley, MD  ? ?Referring MD: Ginger Organ., MD  ? ?Chief Complaint: AF ? ?History of Present Illness:   ? ?Kyle Bullock is a 71 y.o. male who presents for an evaluation of AF at the request of Dr Caryl Comes. Their medical history includes VT s/p ICD in 2016, AF, CAD, HTN, NSVT, tobacco abuse. He has received a shock in the past for a dual tachycardia. After, he was started on amiodarone. He saw Dr Caryl Comes 01/27/2022 and was tolerating amiodarone. Given the assocation of his AF w/ VT, AF ablation was recommended. He had previously undergone AFL ablation with Dr Rayann Heman.  ? ?Today, he is accompanied by a family member. He confirms feeling better after his previous A-flutter ablation. Lately he denies receiving any shocks from his device.  ? ?He endorses mild swelling in his legs, and his weight has increased in the past week. He has not taken Lasix for about a week. ? ?His blood pressure at home is typically similar to today's in clinic reading 90/56. ? ?Previously he was able to feel his heart beats when his heart rate was greater than 200 bpm. ? ?He is able to walk, and he notes being able to procure and use firewood during the cold weather. No significant exertional symptoms. ? ?He denies any chest pain, or shortness of breath. No lightheadedness, headaches, syncope, orthopnea, or PND. ? ?They are planning a trip abroad in mid September 2023. ? ? ?  ?Past Medical History:  ?Diagnosis Date  ? Abnormal TSH   ? a. 11/2014: felt d/t sick euthyroid.  ? AICD (automatic cardioverter/defibrillator) present 02/28/2015  ? CAD (coronary artery disease)   ? a. 11/2014: arrest/STEMI s/p Xience DES to the LAD and PTCA to the D1 ([place]). b. Relook cath 11/2014: no culprit, patent stent.  ? CHF  (congestive heart failure) ([place])   ? 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 a. 11/2014: tx with colchicine and prednisone.  ? Dyslipidemia   ? Dyspnea   ? a. 11/2014: possibly due to combo of CHF and Brilinta. Brilinta changed to Effient.  ? Erectile dysfunction   ? a. Pt aware not to take ED med within 24 hr of NTG and vice versa.  ? Hypertension   ? Ischemic cardiomyopathy   ? a. 11/2014: EF reportedly 20% by cath and echo in [place].  ? NSVT (nonsustained ventricular tachycardia) ([place]) 11/16/14  ? a. 11/2014: admitted after discharge from STEMI admission, 18 beats 135-140bpm in ED.  ? Pericardial effusion   ? a. 11/2014.  ? Pericarditis dx'd 11/2014  ? Pneumonia 1990's X 2  ? RBBB   ? Sleep apnea   ? suspected but not diagnosed (02/28/2015)  ? STEMI (ST elevation myocardial infarction) ([place]) 11/10/2014  ? Tobacco abuse   ? VF (ventricular fibrillation) ([place]) 11/10/14  ? a. 11/2014: arrest with STEMI.  ? ? ?Past Surgical History:  ?Procedure Laterality Date  ? A-FLUTTER ABLATION N/A 09/04/2020  ? Procedure: A-FLUTTER ABLATION;  Surgeon: Thompson Grayer, MD;  Location: [facility];  Service: Cardiovascular;  Laterality: N/A;  ? CARDIAC CATHETERIZATION  11/19/2014  ? CARDIAC DEFIBRILLATOR PLACEMENT  02/28/2015  ? CORONARY ANGIOPLASTY WITH STENT PLACEMENT  11/10/2014  ? LAD DES  ? HAND  SURGERY Bilateral   ? Dupuytren's Contracture  ? IMPLANTABLE CARDIOVERTER DEFIBRILLATOR IMPLANT N/A 02/28/2015  ? SJM Fortify Assura VR ICD implanted by Dr Rayann Heman.  Revascularized following VF arrest, EF remained depressed and ICD implanted  ? KNEE ARTHROSCOPY Right 1982  ? LEFT HEART CATH N/A 11/19/2014  ? Procedure: LEFT HEART CATH;  Surgeon: Lorretta Harp, MD;  Location: [facility] CATH LAB;  Service: Cardiovascular;  Laterality: N/A;  ? RIGHT/LEFT HEART CATH AND CORONARY ANGIOGRAPHY N/A 07/22/2021  ? Procedure: RIGHT/LEFT HEART CATH AND CORONARY ANGIOGRAPHY;  Surgeon: Jolaine Artist, MD;  Location: [place] [facility];  Service:  Cardiovascular;  Laterality: N/A;  ? ? ?Current Medications: ?Current Meds  ?Medication Sig  ? acetaminophen (TYLENOL) 500 MG tablet Take 500 mg by mouth as needed for moderate pain.  ? amiodarone (PACERONE) 200 MG tablet Take 1 tablet (200 mg total) by mouth daily.  ? ANORO ELLIPTA 62.5-25 MCG/INH AEPB Inhale 1 puff into the lungs in the morning.  ? apixaban (ELIQUIS) 5 MG TABS tablet Take 1 tablet (5 mg total) by mouth 2 (two) times daily.  ? atorvastatin (LIPITOR) 20 MG tablet TAKE 1 TABLET DAILY  ? colchicine 0.6 MG tablet Take 0.6 mg by mouth daily as needed (gout flare).   ? ENTRESTO 24-26 MG TAKE 1 TABLET BY MOUTH TWICE DAILY  ? furosemide (LASIX) 20 MG tablet Take 20 mg by mouth as needed.  ? icosapent Ethyl (VASCEPA) 1 g capsule Take 2 g by mouth daily.  ? levothyroxine (SYNTHROID) 100 MCG tablet Take 100 mcg by mouth daily.  ? nitroGLYCERIN (NITROSTAT) 0.4 MG SL tablet PLACE 1 TABLET BY MOUTH UNDER TONGUE EVERY 5 MINUTES AS NEEDED CHEST PAIN  ? spironolactone (ALDACTONE) 25 MG tablet Take 0.5 tablets (12.5 mg total) by mouth daily.  ? triamcinolone cream (KENALOG) 0.5 % Apply 1 application topically 2 (two) times daily as needed (skin irritation/rash).  ? [DISCONTINUED] levothyroxine (SYNTHROID) 75 MCG tablet Take 75 mcg by mouth daily before breakfast.  ?  ? ?Allergies:   Ambien [zolpidem tartrate], Lisinopril, and Brilinta [ticagrelor]  ? ?Social History  ? ?Socioeconomic History  ? Marital status: Married  ?  Spouse name: Not on file  ? Number of children: 2  ? Years of education: Not on file  ? Highest education level: Not on file  ?Occupational History  ? Not on file  ?Tobacco Use  ? Smoking status: Former  ?  Packs/day: 2.00  ?  Years: 40.00  ?  Pack years: 80.00  ?  Types: Cigarettes  ?  Quit date: 11/10/2014  ?  Years since quitting: 7.4  ? Smokeless tobacco: Never  ?Vaping Use  ? Vaping Use: Never used  ?Substance and Sexual Activity  ? Alcohol use: Yes  ?  Alcohol/week: 14.0 standard drinks  ?   Types: 14 Glasses of wine per week  ?  Comment: 02/28/2015 "2, 4oz glasses of wine/night"  ? Drug use: No  ? Sexual activity: Not Currently  ?Other Topics Concern  ? Not on file  ?Social History Narrative  ? 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  Now lives in [place].  ? ?Social Determinants of Health  ? ?Financial Resource Strain: Not on file  ?Food Insecurity: Not on file  ?Transportation Needs: Not on file  ?Physical Activity: Not on file  ?Stress: Not on file  ?Social Connections: Not on file  ?  ? ?Family History: ?The patient's family history includes COPD in  his mother; Hypertension in his father and mother. There is no history of CAD. ? ?ROS:   ?Please see the history of present illness.    ?All other systems reviewed and are negative. ? ?EKGs/Labs/Other Studies Reviewed:   ? ?The following studies were reviewed today: ? ?01/06/2022 Echo ?EF 25% ?RV normal ?LA mildly dilated ?No MR ? ?07/22/2021   Right/Left Heart Cath ?  Prox RCA to Mid RCA lesion is 55% stenosed. ?  1st Mrg lesion is 30% stenosed. ?  Ost LAD to Prox LAD lesion is 30% stenosed. ?  Previously placed Prox LAD to Dist LAD stent (unknown type) is  widely patent. ?  ?Findings: ?  ?Ao = 104/57 (77)  ?LV = 107/7 ?RA =  3 ?RV = 23/1 ?PA = 21/4 (10) ?PCW = 3 ?Fick cardiac output/index = 6.0/2.7 ?PVR = 1.2 WU ?Ao sat = 99% ?PA sat = 74%, 74% ?  ?Assessment: ?CAD with widely patent LAD stent ?Non-obstructive CAD in RCA and LCX with ~ 60% lesion throughout midRCA ?ICM with EF 25% ?Low filling pressures with preserved CO ? ? ? ?EKG:  EKG is personally reviewed. ?04/16/2022: EKG was not ordered. ? ? ?04/16/2022 in clinic device interrogation personally reviewed ?Battery longevity 3.6 years ?Lead parameters stable ?No new HV therapies ? ? ? ? ? ?Recent Labs: ?12/11/2021: Hemoglobin 15.6; Platelets 309; TSH 2.550 ?01/16/2022: ALT 20; B Natriuretic Peptide 513.3; BUN 23; Creatinine, Ser 1.54; Magnesium 2.4; Potassium 4.3; Sodium 139   ? ?Recent Lipid Panel ?   ?Component Value Date/Time  ? CHOL 127 11/07/2021 1212  ? CHOL 101 12/04/2014 1030  ? TRIG 170 (H) 11/07/2021 1212  ? HDL 51 11/07/2021 1212  ? HDL 43 12/04/2014 1030  ? CHOLHDL 2.5 11/07/2021 1212

## 2022-04-16 NOTE — Patient Instructions (Addendum)
Medication Instructions:  ?Your physician recommends that you continue on your current medications as directed. Please refer to the Current Medication list given to you today. ?*If you need a refill on your cardiac medications before your next appointment, please call your pharmacy* ? ?Lab Work: ?Pre op lab work will be Aug 10.  ?If you have labs (blood work) drawn today and your tests are completely normal, you will receive your results only by: ?MyChart Message (if you have MyChart) OR ?A paper copy in the mail ?If you have any lab test that is abnormal or we need to change your treatment, we will call you to review the results. ? ?Testing/Procedures: ?Your physician has requested that you have cardiac CT. Cardiac computed tomography (CT) is a painless test that uses an x-ray machine to take clear, detailed pictures of your heart. For further information please visit [email or web address]. Please follow instruction sheet as given. ? ?Your physician has recommended that you have an ablation. Catheter ablation is a medical procedure used to treat some cardiac arrhythmias (irregular heartbeats). During catheter ablation, a long, thin, flexible tube is put into a blood vessel in your groin (upper thigh), or neck. This tube is called an ablation catheter. It is then guided to your heart through the blood vessel. Radio frequency waves destroy small areas of heart tissue where abnormal heartbeats may cause an arrhythmia to start. Please see the instruction sheet given to you today. ? ? ?Follow-Up: ?At [facility], you and your health needs are our priority.  As part of our continuing mission to provide you with exceptional heart care, we have created designated Provider Care Teams.  These Care Teams include your primary Cardiologist (physician) and Advanced Practice Providers (APPs -  Physician Assistants and Nurse Practitioners) who all work together to provide you with the care you need, when you need it. ? ?Your  physician wants you to follow-up in: Ablation date picked is Aug 28. After your work up is completed I will call you with instructions for your CT scan and ablation.  ? ?We recommend signing up for the patient portal called "MyChart".  Sign up information is provided on this After Visit Summary.  MyChart is used to connect with patients for Virtual Visits (Telemedicine).  Patients are able to view lab/test results, encounter notes, upcoming appointments, etc.  Non-urgent messages can be sent to your provider as well.   ?To learn more about what you can do with MyChart, go to [email or web address].   ? ?Any Other Special Instructions Will Be Listed Below (If Applicable). ? ?Cardiac Ablation ?Cardiac ablation is a procedure to destroy (ablate) some heart tissue that is sending bad signals. These bad signals cause problems in heart rhythm. ?The heart has many areas that make these signals. If there are problems in these areas, they can make the heart beat in a way that is not normal. Destroying some tissues can help make the heart rhythm normal. ?Tell your doctor about: ?Any allergies you have. ?All medicines you are taking. These include vitamins, herbs, eye drops, creams, and over-the-counter medicines. ?Any problems you or family members have had with medicines that make you fall asleep (anesthetics). ?Any blood disorders you have. ?Any surgeries you have had. ?Any medical conditions you have, such as kidney failure. ?Whether you are pregnant or may be pregnant. ?What are the risks? ?This is a safe procedure. But problems may occur, including: ?Infection. ?Bruising and bleeding. ?Bleeding into the chest. ?Stroke or blood clots. ?Damage to nearby  areas of your body. ?Allergies to medicines or dyes. ?The need for a pacemaker if the normal system is damaged. ?Failure of the procedure to treat the problem. ?What happens before the procedure? ?Medicines ?Ask your doctor about: ?Changing or stopping your normal  medicines. This is important. ?Taking aspirin and ibuprofen. Do not take these medicines unless your doctor tells you to take them. ?Taking other medicines, vitamins, herbs, and supplements. ?General instructions ?Follow instructions from your doctor about what you cannot eat or drink. ?Plan to have someone take you home from the hospital or clinic. ?If you will be going home right after the procedure, plan to have someone with you for 24 hours. ?Ask your doctor what steps will be taken to prevent infection. ?What happens during the procedure? ? ?An IV tube will be put into one of your veins. ?You will be given a medicine to help you relax. ?The skin on your neck or groin will be numbed. ?A cut (incision) will be made in your neck or groin. A needle will be put through your cut and into a large vein. ?A tube (catheter) will be put into the needle. The tube will be moved to your heart. ?Dye may be put through the tube. This helps your doctor see your heart. ?Small devices (electrodes) on the tube will send out signals. ?A type of energy will be used to destroy some heart tissue. ?The tube will be taken out. ?Pressure will be held on your cut. This helps stop bleeding. ?A bandage will be put over your cut. ?The exact procedure may vary among doctors and hospitals. ?What happens after the procedure? ?You will be watched until you leave the hospital or clinic. This includes checking your heart rate, breathing rate, oxygen, and blood pressure. ?Your cut will be watched for bleeding. You will need to lie still for a few hours. ?Do not drive for 24 hours or as long as your doctor tells you. ?Summary ?Cardiac ablation is a procedure to destroy some heart tissue. This is done to treat heart rhythm problems. ?Tell your doctor about any medical conditions you may have. Tell him or her about all medicines you are taking to treat them. ?This is a safe procedure. But problems may occur. These include infection, bruising,  bleeding, and damage to nearby areas of your body. ?Follow what your doctor tells you about food and drink. You may also be told to change or stop some of your medicines. ?After the procedure, do not drive for 24 hours or as long as your doctor tells you. ?This information is not intended to replace advice given to you by your health care provider. Make sure you discuss any questions you have with your health care provider. ?Document Revised: 10/27/2019 Document Reviewed: 10/27/2019 ?Elsevier Patient Education ? [place]. ? ? ? ? ? ?  ? ? ?

## 2022-04-28 ENCOUNTER — Encounter: Payer: PPO | Admitting: Student

## 2022-04-30 ENCOUNTER — Other Ambulatory Visit (HOSPITAL_COMMUNITY): Payer: Self-pay

## 2022-04-30 MED ORDER — FUROSEMIDE 20 MG PO TABS: 20.0000 mg | ORAL_TABLET | ORAL | 2 refills | Status: DC | PRN

## 2022-05-09 DIAGNOSIS — I10 Essential (primary) hypertension: Secondary | ICD-10-CM | POA: Diagnosis not present

## 2022-05-09 DIAGNOSIS — E785 Hyperlipidemia, unspecified: Secondary | ICD-10-CM | POA: Diagnosis not present

## 2022-05-09 DIAGNOSIS — E039 Hypothyroidism, unspecified: Secondary | ICD-10-CM | POA: Diagnosis not present

## 2022-05-13 DIAGNOSIS — I10 Essential (primary) hypertension: Secondary | ICD-10-CM | POA: Diagnosis not present

## 2022-05-13 DIAGNOSIS — E785 Hyperlipidemia, unspecified: Secondary | ICD-10-CM | POA: Diagnosis not present

## 2022-05-13 DIAGNOSIS — E039 Hypothyroidism, unspecified: Secondary | ICD-10-CM | POA: Diagnosis not present

## 2022-05-16 ENCOUNTER — Encounter: Payer: Self-pay | Admitting: Internal Medicine

## 2022-05-21 ENCOUNTER — Other Ambulatory Visit (HOSPITAL_COMMUNITY): Payer: Self-pay | Admitting: Physician Assistant

## 2022-06-02 DIAGNOSIS — L309 Dermatitis, unspecified: Secondary | ICD-10-CM | POA: Diagnosis not present

## 2022-06-02 DIAGNOSIS — L259 Unspecified contact dermatitis, unspecified cause: Secondary | ICD-10-CM | POA: Diagnosis not present

## 2022-06-03 ENCOUNTER — Ambulatory Visit (INDEPENDENT_AMBULATORY_CARE_PROVIDER_SITE_OTHER): Payer: PPO

## 2022-06-03 DIAGNOSIS — I255 Ischemic cardiomyopathy: Secondary | ICD-10-CM

## 2022-06-03 LAB — CUP PACEART REMOTE DEVICE CHECK
Battery Remaining Longevity: 42 mo
Battery Remaining Percentage: 39 %
Battery Voltage: 2.98 V
Brady Statistic RV Percent Paced: 1 %
Date Time Interrogation Session: 20230627020028
HighPow Impedance: 64 Ohm
HighPow Impedance: 64 Ohm
Implantable Lead Implant Date: 20160323
Implantable Lead Location: 753860
Implantable Pulse Generator Implant Date: 20160323
Lead Channel Impedance Value: 360 Ohm
Lead Channel Pacing Threshold Amplitude: 0.75 V
Lead Channel Pacing Threshold Pulse Width: 0.5 ms
Lead Channel Sensing Intrinsic Amplitude: 12 mV
Lead Channel Setting Pacing Amplitude: 2.5 V
Lead Channel Setting Pacing Pulse Width: 0.5 ms
Lead Channel Setting Sensing Sensitivity: 0.5 mV
Pulse Gen Serial Number: 7255600

## 2022-06-18 ENCOUNTER — Encounter: Payer: Self-pay | Admitting: Internal Medicine

## 2022-06-18 ENCOUNTER — Telehealth: Payer: Self-pay

## 2022-06-18 ENCOUNTER — Ambulatory Visit (INDEPENDENT_AMBULATORY_CARE_PROVIDER_SITE_OTHER): Payer: PPO | Admitting: Internal Medicine

## 2022-06-18 ENCOUNTER — Other Ambulatory Visit (HOSPITAL_COMMUNITY): Payer: Self-pay | Admitting: Internal Medicine

## 2022-06-18 VITALS — BP 118/62 | HR 61 | Ht 75.0 in | Wt 213.4 lb

## 2022-06-18 DIAGNOSIS — R195 Other fecal abnormalities: Secondary | ICD-10-CM | POA: Diagnosis not present

## 2022-06-18 DIAGNOSIS — I4510 Unspecified right bundle-branch block: Secondary | ICD-10-CM

## 2022-06-18 DIAGNOSIS — I5022 Chronic systolic (congestive) heart failure: Secondary | ICD-10-CM

## 2022-06-18 DIAGNOSIS — I10 Essential (primary) hypertension: Secondary | ICD-10-CM

## 2022-06-18 DIAGNOSIS — I255 Ischemic cardiomyopathy: Secondary | ICD-10-CM

## 2022-06-18 MED ORDER — PLENVU 140 G PO SOLR: 140.0000 g | ORAL | 0 refills | Status: DC

## 2022-06-18 NOTE — Progress Notes (Signed)
Chief Complaint: Positive Cologuard  HPI : 71 year old male with history of HFrEF (EF 20-25%) s/p ICD, CAD s/p PCI, COPD, A-fib on Eliquis presents to discuss positive Cologuard  Patient present to discuss getting a colonoscopy for further evaluation of a positive Cologuard test. Denies hematochezia, melena, weight loss, diarrhea, constipation, ab pain, N&V, dysphagia, chest burning, or regurgitation. Denies fam hx of colon cancer. He is on Eliquis therapy. He has had an abdominal hernia repaired as an infant. He has known hemorrhoids that flare up on occasion. Denies prior colonoscopy. He has done stool tests for colon cancer screening in the past. Denies CP or SOB.  Past Medical History:  Diagnosis Date   Abnormal TSH    a. 11/2014: felt d/t sick euthyroid.   AICD (automatic cardioverter/defibrillator) present 02/28/2015   CAD (coronary artery disease)    a. 11/2014: arrest/STEMI s/p Xience DES to the LAD and PTCA to the D1 [place], [place]). b. Relook cath 11/2014: no culprit, patent stent.   CHF (congestive heart failure) (HCC)    Dressler's syndrome (HCC)    a. 11/2014: tx with colchicine and prednisone.   Dyslipidemia    Dyspnea    a. 11/2014: possibly due to combo of CHF and Brilinta. Brilinta changed to Effient.   Erectile dysfunction    a. Pt aware not to take ED med within 24 hr of NTG and vice versa.   Hypertension    Ischemic cardiomyopathy    a. 11/2014: EF reportedly 20% by cath and echo in [place].   NSVT (nonsustained ventricular tachycardia) (HCC) 11/16/14   a. 11/2014: admitted after discharge from STEMI admission, 18 beats 135-140bpm in ED.   Pericardial effusion    a. 11/2014.   Pericarditis dx'd 11/2014   Pneumonia 1990's X 2   RBBB    Sleep apnea    suspected but not diagnosed (02/28/2015)   STEMI (ST elevation myocardial infarction) (HCC) 11/10/2014   Tobacco abuse    VF (ventricular fibrillation) (HCC) 11/10/14   a. 11/2014: arrest with STEMI.     Past  Surgical History:  Procedure Laterality Date   A-FLUTTER ABLATION N/A 09/04/2020   Procedure: A-FLUTTER ABLATION;  Surgeon: Hillis Range, MD;  Location: [facility] INVASIVE CV LAB;  Service: Cardiovascular;  Laterality: N/A;   CARDIAC CATHETERIZATION  11/19/2014   CARDIAC DEFIBRILLATOR PLACEMENT  02/28/2015   CORONARY ANGIOPLASTY WITH STENT PLACEMENT  11/10/2014   LAD DES   HAND SURGERY Bilateral    Dupuytren's Contracture   IMPLANTABLE CARDIOVERTER DEFIBRILLATOR IMPLANT N/A 02/28/2015   SJM Fortify Assura VR ICD implanted by Dr Johney Frame.  Revascularized following VF arrest, EF remained depressed and ICD implanted   KNEE ARTHROSCOPY Right 12/08/1980   LEFT HEART CATH N/A 11/19/2014   Procedure: LEFT HEART CATH;  Surgeon: Runell Gess, MD;  Location: [facility] CATH LAB;  Service: Cardiovascular;  Laterality: N/A;   RIGHT/LEFT HEART CATH AND CORONARY ANGIOGRAPHY N/A 07/22/2021   Procedure: RIGHT/LEFT HEART CATH AND CORONARY ANGIOGRAPHY;  Surgeon: Dolores Patty, MD;  Location: [facility] INVASIVE CV LAB;  Service: Cardiovascular;  Laterality: N/A;   Family History  Problem Relation Age of Onset   COPD Mother    Hypertension Mother    Heart disease Mother    Hypertension Father    Prostate cancer Father    Crohn's disease Brother    CAD Neg Hx    Colon cancer Neg Hx    Stomach cancer Neg Hx    Esophageal cancer Neg Hx  Social History   Tobacco Use   Smoking status: Former    Packs/day: 2.00    Years: 40.00    Total pack years: 80.00    Types: Cigarettes    Quit date: 11/10/2014    Years since quitting: 7.6   Smokeless tobacco: Never  Vaping Use   Vaping Use: Never used  Substance Use Topics   Alcohol use: Yes    Alcohol/week: 14.0 standard drinks of alcohol    Types: 14 Glasses of wine per week    Comment: 02/28/2015 "2, 4oz glasses of wine/night"   Drug use: No   Current Outpatient Medications  Medication Sig Dispense Refill   acetaminophen (TYLENOL) 500 MG tablet Take 500 mg by  mouth as needed for moderate pain.     amiodarone (PACERONE) 200 MG tablet TAKE 1 TABLET(200 MG) BY MOUTH DAILY 90 tablet 3   ANORO ELLIPTA 62.5-25 MCG/INH AEPB Inhale 1 puff into the lungs in the morning.  11   apixaban (ELIQUIS) 5 MG TABS tablet Take 1 tablet (5 mg total) by mouth 2 (two) times daily. 60 tablet 6   atorvastatin (LIPITOR) 20 MG tablet TAKE 1 TABLET DAILY 90 tablet 3   colchicine 0.6 MG tablet Take 0.6 mg by mouth daily as needed (gout flare).      ENTRESTO 24-26 MG TAKE 1 TABLET BY MOUTH TWICE DAILY 180 tablet 3   furosemide (LASIX) 20 MG tablet Take 1 tablet (20 mg total) by mouth as needed. 30 tablet 2   levothyroxine (SYNTHROID) 100 MCG tablet Take 100 mcg by mouth daily.     nitroGLYCERIN (NITROSTAT) 0.4 MG SL tablet PLACE 1 TABLET BY MOUTH UNDER TONGUE EVERY 5 MINUTES AS NEEDED CHEST PAIN 25 tablet 3   omega-3 acid ethyl esters (LOVAZA) 1 g capsule Take 2 capsules by mouth 2 (two) times daily.     spironolactone (ALDACTONE) 25 MG tablet Take 0.5 tablets (12.5 mg total) by mouth daily. 45 tablet 3   triamcinolone cream (KENALOG) 0.5 % Apply 1 application topically 2 (two) times daily as needed (skin irritation/rash).     icosapent Ethyl (VASCEPA) 1 g capsule Take 2 g by mouth daily. (Patient not taking: Reported on 06/18/2022)     metoprolol succinate (TOPROL-XL) 25 MG 24 hr tablet Take 0.5 tablets (12.5 mg total) by mouth daily. Take with or immediately following a meal. 90 tablet 3   No current facility-administered medications for this visit.   Allergies  Allergen Reactions   Ambien [Zolpidem Tartrate] Anxiety and Other (See Comments)    Causes nightmares   Lisinopril Cough   Brilinta [Ticagrelor] Cough    ? Dyspnea. Changed to Effient 11/2014.     Review of Systems: All systems reviewed and negative except where noted in HPI.   Physical Exam: BP 118/62   Pulse 61   Ht 6\' 3"  (1.905 m)   Wt 213 lb 6.4 oz (96.8 kg)   SpO2 97%   BMI 26.67 kg/m   Constitutional: Pleasant,well-developed, male in no acute distress. HEENT: Normocephalic and atraumatic. Conjunctivae are normal. No scleral icterus. Cardiovascular: Normal rate, regular rhythm.  Pulmonary/chest: Effort normal and breath sounds normal. No wheezing, rales or rhonchi. Abdominal: Soft, nondistended, nontender. Bowel sounds active throughout. There are no masses palpable. No hepatomegaly. Extremities: No edema Neurological: Alert and oriented to person place and time. Skin: Skin is warm and dry. No rashes noted. Psychiatric: Normal mood and affect. Behavior is normal.  Labs 12/2021: CBC nml, TSH nml  Labs 01/2022: CMP with elevated Cr of 1.54. BNP elevated at 513  TTE 05/16/20: IMPRESSIONS   1. Global hypokinesis with akinesis of all apical segments. Left  ventricular ejection fraction, by estimation, is 20 to 25%. The left  ventricle has severely decreased function. The left ventricle has no  regional wall motion abnormalities. The left  ventricular internal cavity size was moderately dilated. Left ventricular  diastolic parameters are consistent with Grade I diastolic dysfunction  (impaired relaxation).   2. Right ventricular systolic function is normal. The right ventricular  size is normal. There is normal pulmonary artery systolic pressure.   3. Left atrial size was moderately dilated.   4. The mitral valve is normal in structure. Trivial mitral valve  regurgitation. No evidence of mitral stenosis.   5. The aortic valve is tricuspid. Aortic valve regurgitation is not  visualized. No aortic stenosis is present.   6. The inferior vena cava is normal in size with greater than 50%  respiratory variability, suggesting right atrial pressure of 3 mmHg.   Chest CT 07/19/21: IMPRESSION: 1. Lung-RADS 2, benign appearance or behavior. Continue annual screening with low-dose chest CT without contrast in 12 months. 2. Emphysema and aortic atherosclerosis. 3. Coronary artery  calcifications.  ASSESSMENT AND PLAN: Positive Cologuard Patient presents to discuss colonoscopy for further evaluation of a positive Cologuard test. I went over the details of the colonoscopy procedure with the patient, and he is agreeable to proceeding. - Colonoscopy [facility] due to low EF. Will need Eliquis held 2 days beforehand so will touch base with Dr. Haroldine Laws to get clearance. Plan for 09/08/22 at 7:30 AM  Christia Reading, MD

## 2022-06-18 NOTE — Telephone Encounter (Signed)
[facility] Pre-operative Risk Assessment     Request for surgical clearance:     Endoscopy Procedure  What type of surgery is being performed?     Colonoscopy   When is this surgery scheduled?     09-08-2022  What type of clearance is required ?   Pharmacy  Are there any medications that need to be held prior to surgery and how long? Yes, Eliquis, 2 days   Practice name and name of physician performing surgery?      [facility]  What is your office phone and fax number?      Phone- [phone]  [phone]  Anesthesia type (None, local, MAC, general) ?       MAC

## 2022-06-18 NOTE — Patient Instructions (Addendum)
If you are age 72 or older, your body mass index should be between 23-30. Your Body mass index is 26.67 kg/m. If this is out of the aforementioned range listed, please consider follow up with your Primary Care Provider.  If you are age 32 or younger, your body mass index should be between 19-25. Your Body mass index is 26.67 kg/m. If this is out of the aformentioned range listed, please consider follow up with your Primary Care Provider.   ________________________________________________________  The [facility] providers would like to encourage you to use [facility] to communicate with providers for non-urgent requests or questions.  Due to long hold times on the telephone, sending your provider a message by [facility] may be a faster and more efficient way to get a response.  Please allow 48 business hours for a response.  Please remember that this is for non-urgent requests.  _______________________________________________________  Bonita Quin have been scheduled for a colonoscopy. Please follow written instructions given to you at your visit today.  Please pick up your prep supplies at the pharmacy within the next 1-3 days. If you use inhalers (even only as needed), please bring them with you on the day of your procedure.  Due to recent changes in healthcare laws, you may see the results of your imaging and laboratory studies on MyChart before your provider has had a chance to review them.  We understand that in some cases there may be results that are confusing or concerning to you. Not all laboratory results come back in the same time frame and the provider may be waiting for multiple results in order to interpret others.  Please give [place] 48 hours in order for your provider to thoroughly review all the results before contacting the office for clarification of your results.   You will be contacted by our office prior to your procedure for directions on holding your Eliquis.  If you do not hear from our office  1 week prior to your scheduled procedure, please call [phone] to discuss.    It was a pleasure to see you today!  Thank you for trusting me with your gastrointestinal care!

## 2022-06-19 NOTE — Telephone Encounter (Signed)
Afib ablation scheduled for 08/04/22, cannot stop anticoagulation for 3 months afterwards. Procedure will need to be moved out to December at the earliest, recommend clearance be resent closer to procedure date.

## 2022-06-20 NOTE — Telephone Encounter (Signed)
Dr Leonides Schanz please advise

## 2022-06-20 NOTE — Telephone Encounter (Signed)
Patient has been notified and aware we will cancel his current procedure and put him on the hospital wait list for December 2023.

## 2022-06-20 NOTE — Telephone Encounter (Signed)
I will add the patient to the hospital list.

## 2022-06-25 NOTE — Progress Notes (Signed)
Remote ICD transmission.   

## 2022-06-26 ENCOUNTER — Telehealth: Payer: Self-pay

## 2022-06-26 DIAGNOSIS — I4891 Unspecified atrial fibrillation: Secondary | ICD-10-CM

## 2022-06-26 NOTE — Telephone Encounter (Signed)
Pt is scheduled for afib ablation on August 28  Will get lab work august 10.  All orders placed.  Instruction letters created and will mail to Pt.  Work up complete.

## 2022-07-05 ENCOUNTER — Other Ambulatory Visit: Payer: Self-pay | Admitting: Student

## 2022-07-05 DIAGNOSIS — I4891 Unspecified atrial fibrillation: Secondary | ICD-10-CM

## 2022-07-07 ENCOUNTER — Other Ambulatory Visit (HOSPITAL_COMMUNITY): Payer: Self-pay | Admitting: *Deleted

## 2022-07-07 DIAGNOSIS — N1831 Chronic kidney disease, stage 3a: Secondary | ICD-10-CM | POA: Diagnosis not present

## 2022-07-07 DIAGNOSIS — I5022 Chronic systolic (congestive) heart failure: Secondary | ICD-10-CM | POA: Diagnosis not present

## 2022-07-07 DIAGNOSIS — I130 Hypertensive heart and chronic kidney disease with heart failure and stage 1 through stage 4 chronic kidney disease, or unspecified chronic kidney disease: Secondary | ICD-10-CM | POA: Diagnosis not present

## 2022-07-07 DIAGNOSIS — I4891 Unspecified atrial fibrillation: Secondary | ICD-10-CM

## 2022-07-07 DIAGNOSIS — E785 Hyperlipidemia, unspecified: Secondary | ICD-10-CM | POA: Diagnosis not present

## 2022-07-07 MED ORDER — APIXABAN 5 MG PO TABS: 5.0000 mg | ORAL_TABLET | Freq: Two times a day (BID) | ORAL | 6 refills | Status: DC

## 2022-07-17 ENCOUNTER — Other Ambulatory Visit: Payer: PPO

## 2022-07-17 DIAGNOSIS — I4891 Unspecified atrial fibrillation: Secondary | ICD-10-CM

## 2022-07-17 LAB — CBC WITH DIFFERENTIAL/PLATELET
Basophils Absolute: 0 10*3/uL (ref 0.0–0.2)
Basos: 1 %
EOS (ABSOLUTE): 0.2 10*3/uL (ref 0.0–0.4)
Eos: 3 %
Hematocrit: 42.2 % (ref 37.5–51.0)
Hemoglobin: 14.4 g/dL (ref 13.0–17.7)
Lymphocytes Absolute: 1.5 10*3/uL (ref 0.7–3.1)
Lymphs: 18 %
MCH: 31.9 pg (ref 26.6–33.0)
MCHC: 34.1 g/dL (ref 31.5–35.7)
MCV: 93 fL (ref 79–97)
Monocytes Absolute: 1.2 10*3/uL — ABNORMAL HIGH (ref 0.1–0.9)
Monocytes: 15 %
Neutrophils Absolute: 5.3 10*3/uL (ref 1.4–7.0)
Neutrophils: 63 %
Platelets: 312 10*3/uL (ref 150–450)
RBC: 4.52 x10E6/uL (ref 4.14–5.80)
RDW: 14.5 % (ref 11.6–15.4)
WBC: 8.3 10*3/uL (ref 3.4–10.8)

## 2022-07-17 LAB — BASIC METABOLIC PANEL
BUN/Creatinine Ratio: 15 (ref 10–24)
BUN: 22 mg/dL (ref 8–27)
CO2: 27 mmol/L (ref 20–29)
Calcium: 9.7 mg/dL (ref 8.6–10.2)
Chloride: 105 mmol/L (ref 96–106)
Creatinine, Ser: 1.43 mg/dL — ABNORMAL HIGH (ref 0.76–1.27)
Glucose: 101 mg/dL — ABNORMAL HIGH (ref 70–99)
Potassium: 5.4 mmol/L — ABNORMAL HIGH (ref 3.5–5.2)
Sodium: 141 mmol/L (ref 134–144)
eGFR: 52 mL/min/{1.73_m2} — ABNORMAL LOW (ref >59)

## 2022-07-18 ENCOUNTER — Telehealth: Payer: Self-pay

## 2022-07-18 DIAGNOSIS — I4891 Unspecified atrial fibrillation: Secondary | ICD-10-CM

## 2022-07-18 DIAGNOSIS — I255 Ischemic cardiomyopathy: Secondary | ICD-10-CM

## 2022-07-18 NOTE — Telephone Encounter (Signed)
Patient's lab results showed potassium of 5.4. Spoke with DOD, Dr. Eldridge Dace who advised that if patient is eating foods high in potassium then he should cut those out and recheck BMET in a week. If patient is not eating food high in potassium then cut spironolactone in half to 12.5 mg daily.  Spoke with the patient who does reports eating several foods high in potassium. He has been educated to reduce intake of these foods and we will recheck labs.

## 2022-07-28 ENCOUNTER — Telehealth (HOSPITAL_COMMUNITY): Payer: Self-pay | Admitting: *Deleted

## 2022-07-28 NOTE — Telephone Encounter (Signed)
Reaching out to patient to offer assistance regarding upcoming cardiac imaging study; pt verbalizes understanding of appt date/time, parking situation and where to check in, pre-test NPO status  and verified current allergies; name and call back number provided for further questions should they arise  Ottilie Wigglesworth RN Navigator Cardiac Imaging [facility] [phone] office [phone] cell  Patient aware to arrive at 1pm. 

## 2022-07-29 ENCOUNTER — Other Ambulatory Visit: Payer: Self-pay | Admitting: Internal Medicine

## 2022-07-29 DIAGNOSIS — F17201 Nicotine dependence, unspecified, in remission: Secondary | ICD-10-CM

## 2022-07-30 ENCOUNTER — Ambulatory Visit (HOSPITAL_COMMUNITY)
Admission: RE | Admit: 2022-07-30 | Discharge: 2022-07-30 | Disposition: A | Discharge status: Home / Self Care | Payer: PPO | Source: Ambulatory Visit | Attending: Cardiology | Admitting: Cardiology

## 2022-07-30 ENCOUNTER — Other Ambulatory Visit: Payer: PPO

## 2022-07-30 DIAGNOSIS — I255 Ischemic cardiomyopathy: Secondary | ICD-10-CM | POA: Diagnosis not present

## 2022-07-30 DIAGNOSIS — I4891 Unspecified atrial fibrillation: Secondary | ICD-10-CM | POA: Diagnosis not present

## 2022-07-30 LAB — BASIC METABOLIC PANEL
BUN/Creatinine Ratio: 13 (ref 10–24)
BUN: 17 mg/dL (ref 8–27)
CO2: 25 mmol/L (ref 20–29)
Calcium: 9.8 mg/dL (ref 8.6–10.2)
Chloride: 104 mmol/L (ref 96–106)
Creatinine, Ser: 1.34 mg/dL — ABNORMAL HIGH (ref 0.76–1.27)
Glucose: 97 mg/dL (ref 70–99)
Potassium: 4.6 mmol/L (ref 3.5–5.2)
Sodium: 142 mmol/L (ref 134–144)
eGFR: 56 mL/min/{1.73_m2} — ABNORMAL LOW (ref >59)

## 2022-07-30 MED ORDER — IOHEXOL 350 MG/ML SOLN
100.0000 mL | Freq: Once | INTRAVENOUS | Status: AC | PRN
  Administered 2022-07-30: 100 mL via INTRAVENOUS

## 2022-08-01 MED ORDER — SODIUM CHLORIDE 0.9 % IV SOLN: INTRAVENOUS | Status: DC

## 2022-08-04 ENCOUNTER — Encounter (HOSPITAL_COMMUNITY): Payer: Self-pay | Admitting: Cardiology

## 2022-08-04 ENCOUNTER — Other Ambulatory Visit: Payer: Self-pay

## 2022-08-04 ENCOUNTER — Ambulatory Visit (HOSPITAL_COMMUNITY)
Admission: RE | Admit: 2022-08-04 | Discharge: 2022-08-04 | Disposition: A | Discharge status: Home / Self Care | Payer: PPO | Attending: Cardiology | Admitting: Cardiology

## 2022-08-04 ENCOUNTER — Encounter (HOSPITAL_COMMUNITY)
Admission: RE | Disposition: A | Discharge status: Home / Self Care | Payer: PPO | Source: Home / Self Care | Attending: Cardiology

## 2022-08-04 ENCOUNTER — Ambulatory Visit (HOSPITAL_COMMUNITY): Payer: PPO | Admitting: Certified Registered Nurse Anesthetist

## 2022-08-04 ENCOUNTER — Ambulatory Visit (HOSPITAL_BASED_OUTPATIENT_CLINIC_OR_DEPARTMENT_OTHER): Payer: PPO | Admitting: Certified Registered Nurse Anesthetist

## 2022-08-04 ENCOUNTER — Other Ambulatory Visit (HOSPITAL_COMMUNITY): Payer: Self-pay

## 2022-08-04 DIAGNOSIS — I2510 Atherosclerotic heart disease of native coronary artery without angina pectoris: Secondary | ICD-10-CM | POA: Insufficient documentation

## 2022-08-04 DIAGNOSIS — Z87891 Personal history of nicotine dependence: Secondary | ICD-10-CM | POA: Insufficient documentation

## 2022-08-04 DIAGNOSIS — I5022 Chronic systolic (congestive) heart failure: Secondary | ICD-10-CM | POA: Diagnosis not present

## 2022-08-04 DIAGNOSIS — I509 Heart failure, unspecified: Secondary | ICD-10-CM | POA: Diagnosis not present

## 2022-08-04 DIAGNOSIS — Z95810 Presence of automatic (implantable) cardiac defibrillator: Secondary | ICD-10-CM | POA: Insufficient documentation

## 2022-08-04 DIAGNOSIS — Z955 Presence of coronary angioplasty implant and graft: Secondary | ICD-10-CM | POA: Diagnosis not present

## 2022-08-04 DIAGNOSIS — I110 Hypertensive heart disease with heart failure: Secondary | ICD-10-CM | POA: Insufficient documentation

## 2022-08-04 DIAGNOSIS — I483 Typical atrial flutter: Secondary | ICD-10-CM | POA: Diagnosis not present

## 2022-08-04 DIAGNOSIS — I4819 Other persistent atrial fibrillation: Secondary | ICD-10-CM | POA: Diagnosis not present

## 2022-08-04 DIAGNOSIS — I252 Old myocardial infarction: Secondary | ICD-10-CM | POA: Diagnosis not present

## 2022-08-04 DIAGNOSIS — I4891 Unspecified atrial fibrillation: Secondary | ICD-10-CM

## 2022-08-04 DIAGNOSIS — Z79899 Other long term (current) drug therapy: Secondary | ICD-10-CM | POA: Diagnosis not present

## 2022-08-04 HISTORY — PX: ATRIAL FIBRILLATION ABLATION: EP1191

## 2022-08-04 LAB — POCT ACTIVATED CLOTTING TIME
Activated Clotting Time: 293 seconds
Activated Clotting Time: 359 seconds
Activated Clotting Time: 299 seconds

## 2022-08-04 SURGERY — ATRIAL FIBRILLATION ABLATION
Anesthesia: General

## 2022-08-04 MED ORDER — DEXAMETHASONE SODIUM PHOSPHATE 10 MG/ML IJ SOLN
INTRAMUSCULAR | Status: DC | PRN
  Administered 2022-08-04: 5 mg via INTRAVENOUS

## 2022-08-04 MED ORDER — FENTANYL CITRATE (PF) 250 MCG/5ML IJ SOLN
INTRAMUSCULAR | Status: DC | PRN
  Administered 2022-08-04: 100 ug via INTRAVENOUS

## 2022-08-04 MED ORDER — ACETAMINOPHEN 325 MG PO TABS: 650.0000 mg | ORAL_TABLET | ORAL | Status: DC | PRN

## 2022-08-04 MED ORDER — CEFAZOLIN SODIUM-DEXTROSE 2-4 GM/100ML-% IV SOLN
INTRAVENOUS | Status: AC
  Filled 2022-08-04: qty 100

## 2022-08-04 MED ORDER — HEPARIN SODIUM (PORCINE) 1000 UNIT/ML IJ SOLN
INTRAMUSCULAR | Status: DC | PRN
  Administered 2022-08-04: 4000 [IU] via INTRAVENOUS
  Administered 2022-08-04: 15000 [IU] via INTRAVENOUS

## 2022-08-04 MED ORDER — ONDANSETRON HCL 4 MG/2ML IJ SOLN
INTRAMUSCULAR | Status: DC | PRN
  Administered 2022-08-04: 4 mg via INTRAVENOUS

## 2022-08-04 MED ORDER — ETOMIDATE 2 MG/ML IV SOLN
INTRAVENOUS | Status: DC | PRN
  Administered 2022-08-04: 20 mg via INTRAVENOUS

## 2022-08-04 MED ORDER — PANTOPRAZOLE SODIUM 40 MG PO TBEC
40.0000 mg | DELAYED_RELEASE_TABLET | Freq: Every day | ORAL | 0 refills | Status: DC
  Filled 2022-08-04: qty 45, 45d supply, fill #0

## 2022-08-04 MED ORDER — LIDOCAINE 2% (20 MG/ML) 5 ML SYRINGE
INTRAMUSCULAR | Status: DC | PRN
  Administered 2022-08-04: 60 mg via INTRAVENOUS

## 2022-08-04 MED ORDER — PROTAMINE SULFATE 10 MG/ML IV SOLN
INTRAVENOUS | Status: DC | PRN
  Administered 2022-08-04: 30 mg via INTRAVENOUS

## 2022-08-04 MED ORDER — PHENYLEPHRINE 80 MCG/ML (10ML) SYRINGE FOR IV PUSH (FOR BLOOD PRESSURE SUPPORT)
PREFILLED_SYRINGE | INTRAVENOUS | Status: DC | PRN
  Administered 2022-08-04 (×3): 80 ug via INTRAVENOUS
  Administered 2022-08-04: 160 ug via INTRAVENOUS

## 2022-08-04 MED ORDER — HEPARIN (PORCINE) IN NACL 1000-0.9 UT/500ML-% IV SOLN
INTRAVENOUS | Status: DC | PRN
  Administered 2022-08-04 (×3): 500 mL

## 2022-08-04 MED ORDER — HEPARIN SODIUM (PORCINE) 1000 UNIT/ML IJ SOLN
INTRAMUSCULAR | Status: AC
Start: 2022-08-04 — End: ?
  Filled 2022-08-04: qty 10

## 2022-08-04 MED ORDER — HEPARIN SODIUM (PORCINE) 1000 UNIT/ML IJ SOLN
INTRAMUSCULAR | Status: DC | PRN
  Administered 2022-08-04: 1000 [IU] via INTRAVENOUS

## 2022-08-04 MED ORDER — HEPARIN (PORCINE) IN NACL 1000-0.9 UT/500ML-% IV SOLN
INTRAVENOUS | Status: AC
  Filled 2022-08-04: qty 1500

## 2022-08-04 MED ORDER — ONDANSETRON HCL 4 MG/2ML IJ SOLN: 4.0000 mg | Freq: Four times a day (QID) | INTRAMUSCULAR | Status: DC | PRN

## 2022-08-04 MED ORDER — CEFAZOLIN SODIUM-DEXTROSE 2-3 GM-%(50ML) IV SOLR
INTRAVENOUS | Status: DC | PRN
  Administered 2022-08-04: 2 g via INTRAVENOUS

## 2022-08-04 MED ORDER — SODIUM CHLORIDE 0.9% FLUSH: 3.0000 mL | INTRAVENOUS | Status: DC | PRN

## 2022-08-04 MED ORDER — PANTOPRAZOLE SODIUM 40 MG PO TBEC
40.0000 mg | DELAYED_RELEASE_TABLET | Freq: Every day | ORAL | Status: DC
  Administered 2022-08-04: 40 mg via ORAL
  Filled 2022-08-04: qty 1

## 2022-08-04 MED ORDER — COLCHICINE 0.6 MG PO TABS
0.6000 mg | ORAL_TABLET | Freq: Two times a day (BID) | ORAL | Status: DC
  Administered 2022-08-04: 0.6 mg via ORAL
  Filled 2022-08-04: qty 1

## 2022-08-04 MED ORDER — ROCURONIUM BROMIDE 10 MG/ML (PF) SYRINGE
PREFILLED_SYRINGE | INTRAVENOUS | Status: DC | PRN
  Administered 2022-08-04: 20 mg via INTRAVENOUS
  Administered 2022-08-04: 10 mg via INTRAVENOUS
  Administered 2022-08-04: 70 mg via INTRAVENOUS

## 2022-08-04 MED ORDER — PHENYLEPHRINE HCL-NACL 20-0.9 MG/250ML-% IV SOLN
INTRAVENOUS | Status: DC | PRN
  Administered 2022-08-04: 25 ug/min via INTRAVENOUS

## 2022-08-04 MED ORDER — SODIUM CHLORIDE 0.9% FLUSH: 3.0000 mL | Freq: Two times a day (BID) | INTRAVENOUS | Status: DC

## 2022-08-04 MED ORDER — SUGAMMADEX SODIUM 200 MG/2ML IV SOLN
INTRAVENOUS | Status: DC | PRN
  Administered 2022-08-04: 200 mg via INTRAVENOUS

## 2022-08-04 MED ORDER — APIXABAN 5 MG PO TABS
5.0000 mg | ORAL_TABLET | Freq: Two times a day (BID) | ORAL | Status: DC
  Administered 2022-08-04: 5 mg via ORAL
  Filled 2022-08-04: qty 1

## 2022-08-04 MED ORDER — PROPOFOL 10 MG/ML IV BOLUS
INTRAVENOUS | Status: DC | PRN
  Administered 2022-08-04: 40 mg via INTRAVENOUS

## 2022-08-04 MED ORDER — EPHEDRINE SULFATE-NACL 50-0.9 MG/10ML-% IV SOSY
PREFILLED_SYRINGE | INTRAVENOUS | Status: DC | PRN
  Administered 2022-08-04: 5 mg via INTRAVENOUS

## 2022-08-04 MED ORDER — SODIUM CHLORIDE 0.9 % IV SOLN: 250.0000 mL | INTRAVENOUS | Status: DC | PRN

## 2022-08-04 SURGICAL SUPPLY — 18 items
BLANKET WARM UNDERBOD FULL ACC (MISCELLANEOUS) ×1 IMPLANT
CATH 8FR REPROCESSED SOUNDSTAR (CATHETERS) ×1 IMPLANT
CATH 8FR SOUNDSTAR REPROCESSED (CATHETERS) IMPLANT
CATH ABLAT QDOT MICRO BI TC DF (CATHETERS) IMPLANT
CATH OCTARAY 2.0 F 3-3-3-3-3 (CATHETERS) IMPLANT
CATH S-M CIRCA TEMP PROBE (CATHETERS) IMPLANT
CATH WEB BI DIR CSDF CRV REPRO (CATHETERS) IMPLANT
CLOSURE PERCLOSE PROSTYLE (VASCULAR PRODUCTS) IMPLANT
COVER SWIFTLINK CONNECTOR (BAG) ×1 IMPLANT
PACK EP LATEX FREE (CUSTOM PROCEDURE TRAY) ×1
PACK EP LF (CUSTOM PROCEDURE TRAY) ×1 IMPLANT
PAD DEFIB RADIO PHYSIO CONN (PAD) ×1 IMPLANT
PATCH CARTO3 (PAD) IMPLANT
SHEATH BAYLIS TRANSSEPTAL 98CM (NEEDLE) IMPLANT
SHEATH CARTO VIZIGO SM CVD (SHEATH) IMPLANT
SHEATH PINNACLE 8F 10CM (SHEATH) IMPLANT
SHEATH PINNACLE 9F 10CM (SHEATH) IMPLANT
TUBING SMART ABLATE COOLFLOW (TUBING) IMPLANT

## 2022-08-04 NOTE — Anesthesia Procedure Notes (Signed)
Arterial Line Insertion Start/End8/28/2023 7:15 AM, 08/04/2022 7:23 AM Performed by: Waynard Edwards, CRNA, CRNA  Patient location: Pre-op. Preanesthetic checklist: patient identified, IV checked, site marked, risks and benefits discussed, surgical consent, monitors and equipment checked, pre-op evaluation, timeout performed and anesthesia consent Lidocaine 1% used for infiltration Left, radial was placed Catheter size: 20 G Hand hygiene performed , maximum sterile barriers used  and Seldinger technique used Allen's test indicative of satisfactory collateral circulation Attempts: 1 Procedure performed without using ultrasound guided technique. Following insertion, dressing applied. Post procedure assessment: normal and unchanged  Patient tolerated the procedure well with no immediate complications.

## 2022-08-04 NOTE — Discharge Instructions (Addendum)
Post procedure care instructions No driving for 4 days. No lifting over 5 lbs for 1 week. No vigorous or sexual activity for 1 week. You may return to work/your usual activities on 08/12/22. Keep procedure site clean & dry. If you notice increased pain, swelling, bleeding or pus, call/return!  You may shower after 24 hours, but no soaking in baths/hot tubs/pools for 1 week.    You have an appointment set up with the Atrial Fibrillation Clinic.  Multiple studies have shown that being followed by a dedicated atrial fibrillation clinic in addition to the standard care you receive from your other physicians improves health. We believe that enrollment in the atrial fibrillation clinic will allow [place] to better care for you.   The phone number to the Atrial Fibrillation Clinic is [phone]. The clinic is staffed Monday through Friday from 8:30am to 5pm.  Parking Directions: The clinic is located in the Heart and Vascular Building connected to [facility]. 1)From [address] turn on to [address] and go to the 3rd entrance  (Heart and Vascular entrance) on the right. 2)Look to the right for Heart &Vascular Parking Garage. 3)A code for the entrance is required, for Sept is 1502 4)Take the elevators to the 1st floor. Registration is in the room with the glass walls at the end of the hallway.  If you have any trouble parking or locating the clinic, please don't hesitate to call [phone].   Cardiac Ablation, Care After  This sheet gives you information about how to care for yourself after your procedure. Your health care provider may also give you more specific instructions. If you have problems or questions, contact your health care provider. What can I expect after the procedure? After the procedure, it is common to have: Bruising around your puncture site. Tenderness around your puncture site. Skipped heartbeats. Tiredness (fatigue).  Follow these instructions at home: Puncture  site care  Follow instructions from your health care provider about how to take care of your puncture site. Make sure you: If present, leave stitches (sutures), skin glue, or adhesive strips in place. These skin closures may need to stay in place for up to 2 weeks. If adhesive strip edges start to loosen and curl up, you may trim the loose edges. Do not remove adhesive strips completely unless your health care provider tells you to do that. If a large square bandage is present, this may be removed 24 hours after surgery.  Check your puncture site every day for signs of infection. Check for: Redness, swelling, or pain. Fluid or blood. If your puncture site starts to bleed, lie down on your back, apply firm pressure to the area, and contact your health care provider. Warmth. Pus or a bad smell. A pea or small marble sized lump at the site is normal and can take up to three months to resolve.  Driving Do not drive for at least 4 days after your procedure or however long your health care provider recommends. (Do not resume driving if you have previously been instructed not to drive for other health reasons.) Do not drive or use heavy machinery while taking prescription pain medicine. Activity Avoid activities that take a lot of effort for at least 7 days after your procedure. Do not lift anything that is heavier than 5 lb (4.5 kg) for one week.  No sexual activity for 1 week.  Return to your normal activities as told by your health care provider. Ask your health care provider  what activities are safe for you. General instructions Take over-the-counter and prescription medicines only as told by your health care provider. Do not use any products that contain nicotine or tobacco, such as cigarettes and e-cigarettes. If you need help quitting, ask your health care provider. You may shower after 24 hours, but Do not take baths, swim, or use a hot tub for 1 week.  Do not drink alcohol for 24 hours after  your procedure. Keep all follow-up visits as told by your health care provider. This is important. Contact a health care provider if: You have redness, mild swelling, or pain around your puncture site. You have fluid or blood coming from your puncture site that stops after applying firm pressure to the area. Your puncture site feels warm to the touch. You have pus or a bad smell coming from your puncture site. You have a fever. You have chest pain or discomfort that spreads to your neck, jaw, or arm. You are sweating a lot. You feel nauseous. You have a fast or irregular heartbeat. You have shortness of breath. You are dizzy or light-headed and feel the need to lie down. You have pain or numbness in the arm or leg closest to your puncture site. Get help right away if: Your puncture site suddenly swells. Your puncture site is bleeding and the bleeding does not stop after applying firm pressure to the area. These symptoms may represent a serious problem that is an emergency. Do not wait to see if the symptoms will go away. Get medical help right away. Call your local emergency services (911 in the [place] Do not drive yourself to the hospital. Summary After the procedure, it is normal to have bruising and tenderness at the puncture site in your groin, neck, or forearm. Check your puncture site every day for signs of infection. Get help right away if your puncture site is bleeding and the bleeding does not stop after applying firm pressure to the area. This is a medical emergency. This information is not intended to replace advice given to you by your health care provider. Make sure you discuss any questions you have with your health care provider.

## 2022-08-04 NOTE — Transfer of Care (Signed)
Immediate Anesthesia Transfer of Care Note  Patient: Kyle Bullock  Procedure(s) Performed: ATRIAL FIBRILLATION ABLATION  Patient Location: Cath lab recovery  Anesthesia Type:General  Level of Consciousness: awake, alert , oriented and patient cooperative  Airway & Oxygen Therapy: Patient Spontanous Breathing and Patient connected to nasal cannula oxygen  Post-op Assessment: Report given to RN, Post -op Vital signs reviewed and stable and Patient moving all extremities X 4  Post vital signs: Reviewed and stable  Last Vitals:  Vitals Value Taken Time  BP 90/53 08/04/22 0958  Temp 36.4 C 08/04/22 0959  Pulse 54 08/04/22 0959  Resp 13 08/04/22 0959  SpO2 98 % 08/04/22 0959  Vitals shown include unvalidated device data.  Last Pain:  Vitals:   08/04/22 0959  TempSrc: Temporal  PainSc: 0-No pain      Patients Stated Pain Goal: 4 (08/04/22 0603)  Complications: There were no known notable events for this encounter.

## 2022-08-04 NOTE — Anesthesia Procedure Notes (Signed)
Procedure Name: Intubation Date/Time: 08/04/2022 7:45 AM  Performed by: Waynard Edwards, CRNAPre-anesthesia Checklist: Patient identified, Emergency Drugs available, Suction available and Patient being monitored Patient Re-evaluated:Patient Re-evaluated prior to induction Oxygen Delivery Method: Circle system utilized Preoxygenation: Pre-oxygenation with 100% oxygen Induction Type: IV induction Ventilation: Mask ventilation without difficulty Laryngoscope Size: Miller and 2 Grade View: Grade I Tube type: Oral Tube size: 7.5 mm Number of attempts: 1 Airway Equipment and Method: Stylet Placement Confirmation: ETT inserted through vocal cords under direct vision, positive ETCO2 and breath sounds checked- equal and bilateral Secured at: 23 cm Tube secured with: Tape Dental Injury: Teeth and Oropharynx as per pre-operative assessment

## 2022-08-04 NOTE — H&P (Signed)
Electrophysiology Office Note:     Date:  08/04/2022    ID:  Kyle Bullock, Kyle Bullock 31-Dec-1950, MRN [id number]   PCP:  Ginger Organ., MD      [facility] Cardiologist:  None  [facility] Electrophysiologist:  Vickie Epley, MD    Referring MD: Ginger Organ., MD    Chief Complaint: AF   History of Present Illness:     Kyle Bullock is a 71 y.o. male who presents for an evaluation of AF at the request of Dr Caryl Comes. Their medical history includes VT s/p ICD in 2016, AF, CAD, HTN, NSVT, tobacco abuse. He has received a shock in the past for a dual tachycardia. After, he was started on amiodarone. He saw Dr Caryl Comes 01/27/2022 and was tolerating amiodarone. Given the assocation of his AF w/ VT, AF ablation was recommended. He had previously undergone AFL ablation with Dr Rayann Heman.    Today, he is accompanied by a family member. He confirms feeling better after his previous A-flutter ablation. Lately he denies receiving any shocks from his device.    He endorses mild swelling in his legs, and his weight has increased in the past week. He has not taken Lasix for about a week.   His blood pressure at home is typically similar to today's in clinic reading 90/56.   Previously he was able to feel his heart beats when his heart rate was greater than 200 bpm.   He is able to walk, and he notes being able to procure and use firewood during the cold weather. No significant exertional symptoms.   He denies any chest pain, or shortness of breath. No lightheadedness, headaches, syncope, orthopnea, or PND.   They are planning a trip abroad in mid September 2023.    Plan for PVI today.   Objective      Past Medical History:  Diagnosis Date   Abnormal TSH      a. 11/2014: felt d/t sick euthyroid.   AICD (automatic cardioverter/defibrillator) present 02/28/2015   CAD (coronary artery disease)      a. 11/2014: arrest/STEMI s/p Xience DES to the LAD and PTCA to the D1 ([place]).  b. Relook cath 11/2014: no culprit, patent stent.   CHF (congestive heart failure) ([place])     Dressler's syndrome ([place])      a. 11/2014: tx with colchicine and prednisone.   Dyslipidemia     Dyspnea      a. 11/2014: possibly due to combo of CHF and Brilinta. Brilinta changed to Effient.   Erectile dysfunction      a. Pt aware not to take ED med within 24 hr of NTG and vice versa.   Hypertension     Ischemic cardiomyopathy      a. 11/2014: EF reportedly 20% by cath and echo in [place].   NSVT (nonsustained ventricular tachycardia) ([place]) 11/16/14    a. 11/2014: admitted after discharge from STEMI admission, 18 beats 135-140bpm in ED.   Pericardial effusion      a. 11/2014.   Pericarditis dx'd 11/2014   Pneumonia 1990's X 2   RBBB     Sleep apnea      suspected but not diagnosed (02/28/2015)   STEMI (ST elevation myocardial infarction) ([place]) 11/10/2014   Tobacco abuse     VF (ventricular fibrillation) ([place]) 11/10/14    a. 11/2014: arrest with STEMI.           Past Surgical History:  Procedure  Laterality Date   A-FLUTTER ABLATION N/A 09/04/2020    Procedure: A-FLUTTER ABLATION;  Surgeon: Hillis Range, MD;  [facility];  Service: Cardiovascular;  Laterality: N/A;   CARDIAC CATHETERIZATION   11/19/2014   CARDIAC DEFIBRILLATOR PLACEMENT   02/28/2015   CORONARY ANGIOPLASTY WITH STENT PLACEMENT   11/10/2014    LAD DES   HAND SURGERY Bilateral      Dupuytren's Contracture   IMPLANTABLE CARDIOVERTER DEFIBRILLATOR IMPLANT N/A 02/28/2015    SJM Fortify Assura VR ICD implanted by Dr Johney Frame.  Revascularized following VF arrest, EF remained depressed and ICD implanted   KNEE ARTHROSCOPY Right 1982   LEFT HEART CATH N/A 11/19/2014    Procedure: LEFT HEART CATH;  Surgeon: Runell Gess, MD;  Location: [facility] CATH LAB;  Service: Cardiovascular;  Laterality: N/A;   RIGHT/LEFT HEART CATH AND CORONARY ANGIOGRAPHY N/A 07/22/2021    Procedure: RIGHT/LEFT HEART CATH AND CORONARY  ANGIOGRAPHY;  Surgeon: Dolores Patty, MD;  [facility];  Service: Cardiovascular;  Laterality: N/A;      Current Medications: Active Medications      Current Meds  Medication Sig   acetaminophen (TYLENOL) 500 MG tablet Take 500 mg by mouth as needed for moderate pain.   amiodarone (PACERONE) 200 MG tablet Take 1 tablet (200 mg total) by mouth daily.   ANORO ELLIPTA 62.5-25 MCG/INH AEPB Inhale 1 puff into the lungs in the morning.   apixaban (ELIQUIS) 5 MG TABS tablet Take 1 tablet (5 mg total) by mouth 2 (two) times daily.   atorvastatin (LIPITOR) 20 MG tablet TAKE 1 TABLET DAILY   colchicine 0.6 MG tablet Take 0.6 mg by mouth daily as needed (gout flare).    ENTRESTO 24-26 MG TAKE 1 TABLET BY MOUTH TWICE DAILY   furosemide (LASIX) 20 MG tablet Take 20 mg by mouth as needed.   icosapent Ethyl (VASCEPA) 1 g capsule Take 2 g by mouth daily.   levothyroxine (SYNTHROID) 100 MCG tablet Take 100 mcg by mouth daily.   nitroGLYCERIN (NITROSTAT) 0.4 MG SL tablet PLACE 1 TABLET BY MOUTH UNDER TONGUE EVERY 5 MINUTES AS NEEDED CHEST PAIN   spironolactone (ALDACTONE) 25 MG tablet Take 0.5 tablets (12.5 mg total) by mouth daily.   triamcinolone cream (KENALOG) 0.5 % Apply 1 application topically 2 (two) times daily as needed (skin irritation/rash).   [DISCONTINUED] levothyroxine (SYNTHROID) 75 MCG tablet Take 75 mcg by mouth daily before breakfast.        Allergies:   Ambien [zolpidem tartrate], Lisinopril, and Brilinta [ticagrelor]    Social History         Socioeconomic History   Marital status: Married      Spouse name: Not on file   Number of children: 2   Years of education: Not on file   Highest education level: Not on file  Occupational History   Not on file  Tobacco Use   Smoking status: Former      Packs/day: 2.00      Years: 40.00      Pack years: 80.00      Types: Cigarettes      Quit date: 11/10/2014      Years since quitting: 7.4   Smokeless tobacco:  Never  Vaping Use   Vaping Use: Never used  Substance and Sexual Activity   Alcohol use: Yes      Alcohol/week: 14.0 standard drinks      Types: 14 Glasses of wine per week  Comment: 02/28/2015 "2, 4oz glasses of wine/night"   Drug use: No   Sexual activity: Not Currently  Other Topics Concern   Not on file  Social History Narrative    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  Attended [employer].  Now lives in [place].    Social Determinants of Health    Financial Resource Strain: Not on file  Food Insecurity: Not on file  Transportation Needs: Not on file  Physical Activity: Not on file  Stress: Not on file  Social Connections: Not on file      Family History: The patient's family history includes COPD in his mother; Hypertension in his father and mother. There is no history of CAD.   ROS:   Please see the history of present illness.    All other systems reviewed and are negative.   EKGs/Labs/Other Studies Reviewed:     The following studies were reviewed today:   01/06/2022 Echo EF 25% RV normal LA mildly dilated No MR   07/22/2021   Right/Left Heart Cath   Prox RCA to Mid RCA lesion is 55% stenosed.   1st Mrg lesion is 30% stenosed.   Ost LAD to Prox LAD lesion is 30% stenosed.   Previously placed Prox LAD to Dist LAD stent (unknown type) is  widely patent.   Findings:   Ao = 104/57 (77)  LV = 107/7 RA =  3 RV = 23/1 PA = 21/4 (10) PCW = 3 Fick cardiac output/index = 6.0/2.7 PVR = 1.2 WU Ao sat = 99% PA sat = 74%, 74%   Assessment: CAD with widely patent LAD stent Non-obstructive CAD in RCA and LCX with ~ 60% lesion throughout midRCA ICM with EF 25% Low filling pressures with preserved CO       EKG:  EKG is personally reviewed. 04/16/2022: EKG was not ordered.     04/16/2022 in clinic device interrogation personally reviewed Battery longevity 3.6 years Lead parameters stable No new HV therapies           Recent Labs: 12/11/2021:  Hemoglobin 15.6; Platelets 309; TSH 2.550 01/16/2022: ALT 20; B Natriuretic Peptide 513.3; BUN 23; Creatinine, Ser 1.54; Magnesium 2.4; Potassium 4.3; Sodium 139    Recent Lipid Panel Labs (Brief)          Component Value Date/Time    CHOL 127 11/07/2021 1212    CHOL 101 12/04/2014 1030    TRIG 170 (H) 11/07/2021 1212    HDL 51 11/07/2021 1212    HDL 43 12/04/2014 1030    CHOLHDL 2.5 11/07/2021 1212    VLDL 34 11/07/2021 1212    LDLCALC 42 11/07/2021 1212    LDLCALC 39 12/04/2014 1030        Physical Exam:     VS:  BP  109/79   Pulse  95   Ht 6\' 3"  (1.905 m)   Wt 214 lb (97.1 kg)   SpO2 97%   BMI 26.75 kg/m         Wt Readings from Last 3 Encounters:  04/16/22 214 lb (97.1 kg)  01/27/22 209 lb 3.2 oz (94.9 kg)  01/16/22 210 lb (95.3 kg)      GEN: Well nourished, well developed in no acute distress HEENT: Normal NECK: No JVD; No carotid bruits LYMPHATICS: No lymphadenopathy CARDIAC: RRR, no murmurs, rubs, gallops: Device pocket well healed RESPIRATORY:  Clear to auscultation without rales, wheezing or rhonchi  ABDOMEN: Soft, non-tender, non-distended MUSCULOSKELETAL:  No edema; No deformity  SKIN: Warm and dry NEUROLOGIC:  Alert and oriented x 3 PSYCHIATRIC:  Normal affect          Assessment ASSESSMENT:     1. Atrial fibrillation, unspecified type ([employer])   2. Typical atrial flutter ([place])   3. Chronic systolic CHF (congestive heart failure) ([place])   4. ICD (implantable cardioverter-defibrillator) in place     PLAN:     In order of problems listed above:   #Persistent atrial fibrillation Recent event monitor showed 5% burden Previously associated with VF/VT events.  Now on amiodarone bu the is interested in proceeding with a rhythm control strategy that avoids long term exposure to antiarrhythmic drugs such as amiodarone. I discussed antiarrhythmic drug therapy and catheter ablation during today's appointment. I discussed the risks and likelihood of  success and he wishes to proceed with scheduling.   Ablation strategy would be PVI and posterior wall and check CTI   Discussed treatment options today for his AF including antiarrhythmic drug therapy and ablation. Discussed risks, recovery and likelihood of success. Discussed potential need for repeat ablation procedures and antiarrhythmic drugs after an initial ablation. They wish to proceed with scheduling.   Risk, benefits, and alternatives to EP study and radiofrequency ablation for afib were also discussed in detail today. These risks include but are not limited to stroke, bleeding, vascular damage, tamponade, perforation, damage to the esophagus, lungs, and other structures, pulmonary vein stenosis, worsening renal function, and death. The patient understands these risk and wishes to proceed.  We will therefore proceed with catheter ablation at the next available time.  Carto, ICE, anesthesia are requested for the procedure.  Will also obtain CT PV protocol prior to the procedure to exclude LAA thrombus and further evaluate atrial anatomy.   #ICD in situ Device functioning appropriately on today's check. Continue remote interrogations.    Plan for PVI today. Procedure reviewed.    Signed, Hilton Cork. Quentin Ore, MD, [facility], [facility] 08/04/2022 Electrophysiology [facility]

## 2022-08-04 NOTE — Anesthesia Preprocedure Evaluation (Signed)
Anesthesia Evaluation  Patient identified by MRN, date of birth, ID band Patient awake    Reviewed: Allergy & Precautions, H&P , NPO status , Patient's Chart, lab work & pertinent test results  Airway Mallampati: II   Neck ROM: full    Dental   Pulmonary shortness of breath, sleep apnea , COPD, former smoker,    breath sounds clear to auscultation       Cardiovascular hypertension, + CAD, + Past MI, + Cardiac Stents and +CHF  + dysrhythmias + Cardiac Defibrillator  Rhythm:regular Rate:Normal  EF 20%   Neuro/Psych    GI/Hepatic   Endo/Other    Renal/GU      Musculoskeletal   Abdominal   Peds  Hematology   Anesthesia Other Findings   Reproductive/Obstetrics                             Anesthesia Physical Anesthesia Plan  ASA: 3  Anesthesia Plan: General   Post-op Pain Management:    Induction: Intravenous  PONV Risk Score and Plan: 2 and Ondansetron, Dexamethasone and Treatment may vary due to age or medical condition  Airway Management Planned: Oral ETT  Additional Equipment: Arterial line  Intra-op Plan:   Post-operative Plan: Extubation in OR  Informed Consent: I have reviewed the patients History and Physical, chart, labs and discussed the procedure including the risks, benefits and alternatives for the proposed anesthesia with the patient or authorized representative who has indicated his/her understanding and acceptance.     Dental advisory given  Plan Discussed with: CRNA, Anesthesiologist and Surgeon  Anesthesia Plan Comments:         Anesthesia Quick Evaluation

## 2022-08-04 NOTE — Anesthesia Postprocedure Evaluation (Signed)
Anesthesia Post Note  Patient: Kyle Bullock  Procedure(s) Performed: ATRIAL FIBRILLATION ABLATION     Patient location during evaluation: Cath Lab Anesthesia Type: General Level of consciousness: awake and alert Pain management: pain level controlled Vital Signs Assessment: post-procedure vital signs reviewed and stable Respiratory status: spontaneous breathing, nonlabored ventilation, respiratory function stable and patient connected to nasal cannula oxygen Cardiovascular status: blood pressure returned to baseline and stable Postop Assessment: no apparent nausea or vomiting Anesthetic complications: no   There were no known notable events for this encounter.  Last Vitals:  Vitals:   08/04/22 1031 08/04/22 1050  BP:  (!) 87/51  Pulse: (!) 50 (!) 49  Resp: 15 13  Temp: 36.4 C   SpO2: 96% 96%    Last Pain:  Vitals:   08/04/22 1031  TempSrc: Temporal  PainSc: 0-No pain                 Yarelin Reichardt S

## 2022-08-04 NOTE — Progress Notes (Signed)
Dr. Lalla Brothers paged and made aware of cuff and arterial BP readings.

## 2022-08-05 ENCOUNTER — Encounter (HOSPITAL_COMMUNITY): Payer: Self-pay | Admitting: Cardiology

## 2022-08-09 ENCOUNTER — Other Ambulatory Visit: Payer: Self-pay | Admitting: Student

## 2022-08-12 ENCOUNTER — Other Ambulatory Visit: Payer: Self-pay

## 2022-08-12 MED ORDER — METOPROLOL SUCCINATE ER 25 MG PO TB24: 12.5000 mg | ORAL_TABLET | Freq: Every day | ORAL | 2 refills | Status: DC

## 2022-09-01 ENCOUNTER — Ambulatory Visit
Admission: RE | Admit: 2022-09-01 | Discharge: 2022-09-01 | Disposition: A | Discharge status: Home / Self Care | Payer: PPO | Source: Ambulatory Visit | Attending: Internal Medicine | Admitting: Internal Medicine

## 2022-09-01 DIAGNOSIS — F17201 Nicotine dependence, unspecified, in remission: Secondary | ICD-10-CM

## 2022-09-01 DIAGNOSIS — Z87891 Personal history of nicotine dependence: Secondary | ICD-10-CM | POA: Diagnosis not present

## 2022-09-02 ENCOUNTER — Ambulatory Visit (HOSPITAL_COMMUNITY)
Admission: RE | Admit: 2022-09-02 | Discharge: 2022-09-02 | Disposition: A | Discharge status: Home / Self Care | Payer: PPO | Source: Ambulatory Visit | Attending: Nurse Practitioner | Admitting: Nurse Practitioner

## 2022-09-02 ENCOUNTER — Ambulatory Visit (INDEPENDENT_AMBULATORY_CARE_PROVIDER_SITE_OTHER): Payer: PPO

## 2022-09-02 ENCOUNTER — Encounter (HOSPITAL_COMMUNITY): Payer: Self-pay | Admitting: Nurse Practitioner

## 2022-09-02 VITALS — BP 98/52 | HR 49 | Ht 75.0 in | Wt 211.2 lb

## 2022-09-02 DIAGNOSIS — I4510 Unspecified right bundle-branch block: Secondary | ICD-10-CM

## 2022-09-02 DIAGNOSIS — Z7901 Long term (current) use of anticoagulants: Secondary | ICD-10-CM | POA: Insufficient documentation

## 2022-09-02 DIAGNOSIS — I509 Heart failure, unspecified: Secondary | ICD-10-CM | POA: Diagnosis not present

## 2022-09-02 DIAGNOSIS — D6869 Other thrombophilia: Secondary | ICD-10-CM

## 2022-09-02 DIAGNOSIS — I5022 Chronic systolic (congestive) heart failure: Secondary | ICD-10-CM | POA: Diagnosis not present

## 2022-09-02 DIAGNOSIS — I2510 Atherosclerotic heart disease of native coronary artery without angina pectoris: Secondary | ICD-10-CM | POA: Diagnosis not present

## 2022-09-02 DIAGNOSIS — I483 Typical atrial flutter: Secondary | ICD-10-CM

## 2022-09-02 DIAGNOSIS — I110 Hypertensive heart disease with heart failure: Secondary | ICD-10-CM | POA: Diagnosis not present

## 2022-09-02 DIAGNOSIS — Z720 Tobacco use: Secondary | ICD-10-CM | POA: Diagnosis not present

## 2022-09-02 DIAGNOSIS — I10 Essential (primary) hypertension: Secondary | ICD-10-CM

## 2022-09-02 DIAGNOSIS — I959 Hypotension, unspecified: Secondary | ICD-10-CM | POA: Insufficient documentation

## 2022-09-02 DIAGNOSIS — Z95810 Presence of automatic (implantable) cardiac defibrillator: Secondary | ICD-10-CM | POA: Insufficient documentation

## 2022-09-02 DIAGNOSIS — I4891 Unspecified atrial fibrillation: Secondary | ICD-10-CM | POA: Diagnosis not present

## 2022-09-02 DIAGNOSIS — I255 Ischemic cardiomyopathy: Secondary | ICD-10-CM | POA: Diagnosis not present

## 2022-09-02 DIAGNOSIS — I472 Ventricular tachycardia, unspecified: Secondary | ICD-10-CM | POA: Diagnosis not present

## 2022-09-02 MED ORDER — SPIRONOLACTONE 25 MG PO TABS: 12.5000 mg | ORAL_TABLET | Freq: Every day | ORAL | Status: DC

## 2022-09-02 NOTE — Progress Notes (Signed)
Primary Care Physician: Ginger Organ., MD Referring Physician: Dr. Loralyn Freshwater is a 71 y.o. male with a h/o  VT s/p ICD in 2016, AF, CAD, HTN, NSVT, tobacco abuse. He has received a shock in the past for a dual tachycardia. After, he was started on amiodarone. He saw Dr Caryl Comes 01/27/2022 and was tolerating amiodarone. Given the assocation of his AF w/ VT, AF ablation was recommended. He had previously undergone AFL ablation with Dr Rayann Heman.   He is now in the afib clinic s/p afib ablation 08/04/22. He has  not felt any arrhythmia. He went to [place] 2 weeks after ablation and did well. He continues on amiodarone 200 mg daily. No swallowing or groin issues.  Today, he denies symptoms of palpitations, chest pain, shortness of breath, orthopnea, PND, lower extremity edema, dizziness, presyncope, syncope, or neurologic sequela. The patient is tolerating medications without difficulties and is otherwise without complaint today.   Past Medical History:  Diagnosis Date   Abnormal TSH    a. 11/2014: felt d/t sick euthyroid.   AICD (automatic cardioverter/defibrillator) present 02/28/2015   CAD (coronary artery disease)    a. 11/2014: arrest/STEMI s/p Xience DES to the LAD and PTCA to the D1 ([place]). b. Relook cath 11/2014: no culprit, patent stent.   CHF (congestive heart failure) ([place])    Dressler's syndrome ([place])    a. 11/2014: tx with colchicine and prednisone.   Dyslipidemia    Dyspnea    a. 11/2014: possibly due to combo of CHF and Brilinta. Brilinta changed to Effient.   Erectile dysfunction    a. Pt aware not to take ED med within 24 hr of NTG and vice versa.   Hypertension    Ischemic cardiomyopathy    a. 11/2014: EF reportedly 20% by cath and echo in [place].   NSVT (nonsustained ventricular tachycardia) ([place]) 11/16/14   a. 11/2014: admitted after discharge from STEMI admission, 18 beats 135-140bpm in ED.   Pericardial effusion    a. 11/2014.    Pericarditis dx'd 11/2014   Pneumonia 1990's X 2   RBBB    Sleep apnea    suspected but not diagnosed (02/28/2015)   STEMI (ST elevation myocardial infarction) ([place]) 11/10/2014   Tobacco abuse    VF (ventricular fibrillation) ([place]) 11/10/14   a. 11/2014: arrest with STEMI.   Past Surgical History:  Procedure Laterality Date   A-FLUTTER ABLATION N/A 09/04/2020   Procedure: A-FLUTTER ABLATION;  Surgeon: Thompson Grayer, MD;  Location: [facility] CV LAB;  Service: Cardiovascular;  Laterality: N/A;   ATRIAL FIBRILLATION ABLATION N/A 08/04/2022   Procedure: ATRIAL FIBRILLATION ABLATION;  Surgeon: Vickie Epley, MD;  Location: [facility] CV LAB;  Service: Cardiovascular;  Laterality: N/A;   CARDIAC CATHETERIZATION  11/19/2014   CARDIAC DEFIBRILLATOR PLACEMENT  02/28/2015   CORONARY ANGIOPLASTY WITH STENT PLACEMENT  11/10/2014   LAD DES   HAND SURGERY Bilateral    Dupuytren's Contracture   IMPLANTABLE CARDIOVERTER DEFIBRILLATOR IMPLANT N/A 02/28/2015   SJM Fortify Assura VR ICD implanted by Dr Rayann Heman.  Revascularized following VF arrest, EF remained depressed and ICD implanted   KNEE ARTHROSCOPY Right 12/08/1980   LEFT HEART CATH N/A 11/19/2014   Procedure: LEFT HEART CATH;  Surgeon: Lorretta Harp, MD;  Location: [facility] CATH LAB;  Service: Cardiovascular;  Laterality: N/A;   RIGHT/LEFT HEART CATH AND CORONARY ANGIOGRAPHY N/A 07/22/2021   Procedure: RIGHT/LEFT HEART CATH AND CORONARY ANGIOGRAPHY;  Surgeon: Glori Bickers  R, MD;  [facility]: [facility];  Service: Cardiovascular;  Laterality: N/A;    Current Outpatient Medications  Medication Sig Dispense Refill   acetaminophen (TYLENOL) 500 MG tablet Take 500 mg by mouth every 8 (eight) hours as needed for moderate pain.     amiodarone (PACERONE) 200 MG tablet TAKE 1 TABLET(200 MG) BY MOUTH DAILY 90 tablet 3   ANORO ELLIPTA 62.5-25 MCG/INH AEPB Inhale 1 puff into the lungs in the morning.  11   apixaban (ELIQUIS) 5 MG TABS tablet  Take 1 tablet (5 mg total) by mouth 2 (two) times daily. 60 tablet 6   atorvastatin (LIPITOR) 20 MG tablet TAKE 1 TABLET DAILY 90 tablet 3   colchicine 0.6 MG tablet Take 0.6 mg by mouth daily as needed (gout flare).      ENTRESTO 24-26 MG TAKE 1 TABLET BY MOUTH TWICE DAILY 180 tablet 3   furosemide (LASIX) 20 MG tablet Take 1 tablet (20 mg total) by mouth as needed. (Patient taking differently: Take 20 mg by mouth daily as needed for edema.) 30 tablet 2   levothyroxine (SYNTHROID) 100 MCG tablet Take 100 mcg by mouth daily before breakfast.     metoprolol succinate (TOPROL-XL) 25 MG 24 hr tablet Take 0.5 tablets (12.5 mg total) by mouth daily. 45 tablet 2   nitroGLYCERIN (NITROSTAT) 0.4 MG SL tablet PLACE 1 TABLET BY MOUTH UNDER TONGUE EVERY 5 MINUTES AS NEEDED CHEST PAIN 25 tablet 3   omega-3 acid ethyl esters (LOVAZA) 1 g capsule Take 2 capsules by mouth 2 (two) times daily.     pantoprazole (PROTONIX) 40 MG tablet Take 1 tablet (40 mg total) by mouth daily. 45 tablet 0   triamcinolone cream (KENALOG) 0.5 % Apply 1 application topically 2 (two) times daily as needed (skin irritation/rash).     spironolactone (ALDACTONE) 25 MG tablet Take 0.5 tablets (12.5 mg total) by mouth daily.     No current facility-administered medications for this encounter.    Allergies  Allergen Reactions   Ambien [Zolpidem Tartrate] Anxiety and Other (See Comments)    Causes nightmares   Lisinopril Cough   Brilinta [Ticagrelor] Cough    ? Dyspnea. Changed to Effient 11/2014.    Social History   Socioeconomic History   Marital status: Married    Spouse name: Not on file   Number of children: 2   Years of education: Not on file   Highest education level: Not on file  Occupational History   Occupation: retired  Tobacco Use   Smoking status: Former    Packs/day: 2.00    Years: 40.00    Total pack years: 80.00    Types: Cigarettes    Quit date: 11/10/2014    Years since quitting: 7.8   Smokeless  tobacco: Never  Vaping Use   Vaping Use: Never used  Substance and Sexual Activity   Alcohol use: Yes    Alcohol/week: 14.0 standard drinks of alcohol    Types: 14 Glasses of wine per week    Comment: 02/28/2015 "2, 4oz glasses of wine/night"   Drug use: No   Sexual activity: Not Currently  Other Topics Concern   Not on file  Social History Narrative   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  Attended [employer].  Now lives in [place].   Social Determinants of Health   Financial Resource Strain: Not on file  Food Insecurity: Not on file  Transportation Needs: Not on file  Physical Activity: Not on  file  Stress: Not on file  Social Connections: Not on file  Intimate Partner Violence: Not on file    Family History  Problem Relation Age of Onset   COPD Mother    Hypertension Mother    Heart disease Mother    Hypertension Father    Prostate cancer Father    Crohn's disease Brother    CAD Neg Hx    Colon cancer Neg Hx    Stomach cancer Neg Hx    Esophageal cancer Neg Hx     ROS- All systems are reviewed and negative except as per the HPI above  Physical Exam: Vitals:   09/02/22 1024  BP: (!) 98/52  Pulse: (!) 49  Weight: 95.8 kg  Height: 6\' 3"  (1.905 m)   Wt Readings from Last 3 Encounters:  09/02/22 95.8 kg  08/04/22 95.3 kg  06/18/22 96.8 kg    Labs: Lab Results  Component Value Date   NA 142 07/30/2022   K 4.6 07/30/2022   CL 104 07/30/2022   CO2 25 07/30/2022   GLUCOSE 97 07/30/2022   BUN 17 07/30/2022   CREATININE 1.34 (H) 07/30/2022   CALCIUM 9.8 07/30/2022   MG 2.4 01/16/2022   Lab Results  Component Value Date   INR 1.17 11/24/2014   Lab Results  Component Value Date   CHOL 127 11/07/2021   HDL 51 11/07/2021   LDLCALC 42 11/07/2021   TRIG 170 (H) 11/07/2021     GEN- The patient is well appearing, alert and oriented x 3 today.   Head- normocephalic, atraumatic Eyes-  Sclera clear, conjunctiva pink Ears- hearing  intact Oropharynx- clear Neck- supple, no JVP Lymph- no cervical lymphadenopathy Lungs- Clear to ausculation bilaterally, normal work of breathing Heart- Regular rate and rhythm, no murmurs, rubs or gallops, PMI not laterally displaced GI- soft, NT, ND, + BS Extremities- no clubbing, cyanosis, or edema MS- no significant deformity or atrophy Skin- no rash or lesion Psych- euthymic mood, full affect Neuro- strength and sensation are intact  EKG-Vent. rate 49 BPM PR interval 228 ms QRS duration 130 ms QT/QTcB 518/467 ms P-R-T axes 87 120 44 Sinus bradycardia with 1st degree A-V block Right bundle branch block Anterolateral infarct , age undetermined Abnormal ECG When compared with ECG of 04-Aug-2022 10:03, PREVIOUS ECG IS PRESENT    Assessment and Plan:  1. Afib S/p ablation  Doing well staying in [place] Continue amiodarone 200 mg daily Continue metoprolol succinate 25 mg, 1/2 tab daily   2. Hypotension Stable not symptomatic   3. ICD Per Dr. 06-Aug-2022 clinic   4. CAD No anginal symptoms   F/u with Dr. Meyer Russel 11/27  12/27. [facility] [address] [place], [place] [phone]

## 2022-09-03 ENCOUNTER — Ambulatory Visit (HOSPITAL_COMMUNITY)
Admission: RE | Admit: 2022-09-03 | Discharge: 2022-09-03 | Disposition: A | Discharge status: Home / Self Care | Payer: PPO | Source: Ambulatory Visit | Attending: Internal Medicine | Admitting: Internal Medicine

## 2022-09-03 VITALS — BP 100/60 | HR 54 | Wt 213.0 lb

## 2022-09-03 DIAGNOSIS — I255 Ischemic cardiomyopathy: Secondary | ICD-10-CM | POA: Insufficient documentation

## 2022-09-03 DIAGNOSIS — Z79899 Other long term (current) drug therapy: Secondary | ICD-10-CM | POA: Insufficient documentation

## 2022-09-03 DIAGNOSIS — I110 Hypertensive heart disease with heart failure: Secondary | ICD-10-CM | POA: Diagnosis not present

## 2022-09-03 DIAGNOSIS — Z7902 Long term (current) use of antithrombotics/antiplatelets: Secondary | ICD-10-CM | POA: Diagnosis not present

## 2022-09-03 DIAGNOSIS — I252 Old myocardial infarction: Secondary | ICD-10-CM | POA: Insufficient documentation

## 2022-09-03 DIAGNOSIS — I471 Supraventricular tachycardia: Secondary | ICD-10-CM | POA: Diagnosis not present

## 2022-09-03 DIAGNOSIS — I4901 Ventricular fibrillation: Secondary | ICD-10-CM

## 2022-09-03 DIAGNOSIS — R0683 Snoring: Secondary | ICD-10-CM | POA: Insufficient documentation

## 2022-09-03 DIAGNOSIS — I4891 Unspecified atrial fibrillation: Secondary | ICD-10-CM | POA: Diagnosis not present

## 2022-09-03 DIAGNOSIS — I509 Heart failure, unspecified: Secondary | ICD-10-CM | POA: Diagnosis not present

## 2022-09-03 DIAGNOSIS — I2510 Atherosclerotic heart disease of native coronary artery without angina pectoris: Secondary | ICD-10-CM | POA: Insufficient documentation

## 2022-09-03 DIAGNOSIS — I5022 Chronic systolic (congestive) heart failure: Secondary | ICD-10-CM | POA: Diagnosis not present

## 2022-09-03 DIAGNOSIS — J439 Emphysema, unspecified: Secondary | ICD-10-CM | POA: Insufficient documentation

## 2022-09-03 DIAGNOSIS — Z7982 Long term (current) use of aspirin: Secondary | ICD-10-CM | POA: Diagnosis not present

## 2022-09-03 DIAGNOSIS — Z7901 Long term (current) use of anticoagulants: Secondary | ICD-10-CM | POA: Insufficient documentation

## 2022-09-03 DIAGNOSIS — I480 Paroxysmal atrial fibrillation: Secondary | ICD-10-CM

## 2022-09-03 LAB — CUP PACEART REMOTE DEVICE CHECK
Battery Remaining Longevity: 38 mo
Battery Remaining Percentage: 36 %
Battery Voltage: 2.96 V
Brady Statistic RV Percent Paced: 1 %
Date Time Interrogation Session: 20230926141705
HighPow Impedance: 70 Ohm
HighPow Impedance: 70 Ohm
Implantable Lead Implant Date: 20160323
Implantable Lead Location: 753860
Implantable Pulse Generator Implant Date: 20160323
Lead Channel Impedance Value: 350 Ohm
Lead Channel Pacing Threshold Amplitude: 1 V
Lead Channel Pacing Threshold Pulse Width: 0.5 ms
Lead Channel Sensing Intrinsic Amplitude: 12 mV
Lead Channel Setting Pacing Amplitude: 2.5 V
Lead Channel Setting Pacing Pulse Width: 0.5 ms
Lead Channel Setting Sensing Sensitivity: 0.5 mV
Pulse Gen Serial Number: 7255600

## 2022-09-03 NOTE — Addendum Note (Signed)
Encounter addended by: Jerl Mina, RN on: 09/03/2022 12:52 PM  Actions taken: Clinical Note Signed

## 2022-09-03 NOTE — Patient Instructions (Signed)
There has been no changes to your medications.  Your physician recommends that you schedule a follow-up appointment in: 6 months ( March 2024) ** please call the office in January to arrange your follow up appointment.**  If you have any questions or concerns before your next appointment please send [place] a message through [place] or call our office at [phone].    TO LEAVE A MESSAGE FOR THE NURSE SELECT OPTION 2, PLEASE LEAVE A MESSAGE INCLUDING: YOUR NAME DATE OF BIRTH CALL BACK NUMBER REASON FOR CALL**this is important as we prioritize the call backs  YOU WILL RECEIVE A CALL BACK THE SAME DAY AS LONG AS YOU CALL BEFORE 4:00 PM  At the [facility], you and your health needs are our priority. As part of our continuing mission to provide you with exceptional heart care, we have created designated Provider Care Teams. These Care Teams include your primary Cardiologist (physician) and Advanced Practice Providers (APPs- Physician Assistants and Nurse Practitioners) who all work together to provide you with the care you need, when you need it.   You may see any of the following providers on your designated Care Team at your next follow up: Dr Glori Bickers Dr Loralie Champagne Dr. Roxana Hires, NP Lyda Jester, [place] [facility] [place], [place] Forestine Na, NP Audry Riles, PharmD   Please be sure to bring in all your medications bottles to every appointment.

## 2022-09-03 NOTE — Progress Notes (Signed)
Patient ID: Kyle Bullock, male   DOB: Nov 19, 1951, 71 y.o.   MRN: [id number]    Advanced Heart Failure Clinic Note    History of Present Illness:  Kyle Bullock is a 71 y.o. male who is referred to the HF Clinic by Dr. Burt Knack. He has h/o CAD and ischemic cardiomyopathy 20-25%.  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    In December 2015, he suffered an acute anterolateral MI complicated by VF arrest in [place]. He underwent drug-eluting stent placement to the LAD and PTCA of the diagonal. EF was 25% and a life vest was placed prior to discharge. Following return to [place], he developed dyspnea and presented to [facility] where he was readmitted. His Brilinta was switched to Effient as it was felt the Brilinta may have been playing a role in his dyspnea. He was also switched from an ACE inhibitor to an ARB. He was seen by electrophysiology with recommendation for ongoing life vest therapy and follow-up echo in 90 days post revascularization. He was then seen in clinic in mid December where he reported pleuritic chest pain. He was readmitted and underwent diagnostic catheterization revealing patency of previously placed stent and area of balloon angioplasty in the diagonal. EF was 20-25% with diffuse hypokinesis and normal pericardial appearance. He did have some hypotension requiring discontinuation of ARB therapy. His beta blocker was also down titrated. He was diagnosed with Dressler's syndrome and was placed on prednisone taper and colchicine.  CT with COPD. Referred to Dr. [place] Bells who felt he had mild COPD but no ILD. Given PRN albuterol. Has repeat CT scheduled for 1 year.   Had ATP in 7/22. Found on device interrogation. Seen by EP. According to EP device showed initially irregular arrhythmia concerning for Afib. He then developed a regular tachy arrhythmia (likely VT) which was terminated with ATP. After ATP, his rhythm was again regular. Zio placed showed SVT with  some ectopy. No VT or AF.   Cath in 8/22 for CP with stable CAD.   Had ICD shock 12/29 while he was stacking wood and carrying large loads, when he received an ICD shock. Seen by EP. Felt to have AF/AFL possibly leading to VT. Zio placed showing 5% AF/AFL. Started on amio. Eliquis resumed.   Echo 1/23 EF 25-30%   Had AF ablation 08/04/22 remains on amio 200 daily.   Here for f/u with his wife. Feels good.. Walking 2.5 miles in 50-55 mins -> 4-5x/week. Denies CP os significant SOB. Mild ab bloating. No LE edema, orthopnea or PND. Takes lasix 2-3x/week    Cardiac studies:   Cath 8/22   Prox RCA to Mid RCA lesion is 55% stenosed.   1st Mrg lesion is 30% stenosed.   Ost LAD to Prox LAD lesion is 30% stenosed.   Previously placed Prox LAD to Dist LAD stent (unknown type) is  widely patent.   Findings:   Ao = 104/57 (77)  LV = 107/7 RA =  3 RV = 23/1 PA = 21/4 (10) PCW = 3 Fick cardiac output/index = 6.0/2.7 PVR = 1.2 WU Ao sat = 99% PA sat = 74%, 74%   Assessment: CAD with widely patent LAD stent Non-obstructive CAD in RCA and LCX with ~ 60% lesion throughout [facility] ICM with EF 25% Low filling pressures with preserved CO    Echo 3/16 EF 25-30% Echo 03/2016 EF 20-25% Echo 9/18 EF 20-25% RV normal   CPX 2/18 FVC 3.49 (  72%)      FEV1 2.49 (68%)        FEV1/FVC 71 (93%)        MVV 117 (82%) Resting HR: 62 Peak HR: 148   (97% age predicted max HR) BP rest: 84/60 BP peak: 156/62 Peak VO2: 19.6 (69% predicted peak VO2) VE/VCO2 slope:  37 OUES: 2.14 Peak RER: 1.06 Ventilatory Threshold: 17.4 (61% predicted or measured peak VO2) VE/MVV:  66% O2pulse:  12   (71% predicted O2pulse)  CPX 05/14/2015  Peak VO2: 19.0 (64.5% predicted peak VO2) VE/VCO2 slope:  41.5 OUES: 2.01 Peak RER: 1.06 Ventilatory Threshold: 14.1          (47.9% predicted or measured peak VO2)   Past Medical History:  Diagnosis Date   Abnormal TSH    a. 11/2014: felt d/t sick euthyroid.   AICD  (automatic cardioverter/defibrillator) present 02/28/2015   CAD (coronary artery disease)    a. 11/2014: arrest/STEMI s/p Xience DES to the LAD and PTCA to the D1 ([employer]). b. Relook cath 11/2014: no culprit, patent stent.   CHF (congestive heart failure) ([place])    Dressler's syndrome ([place])    a. 11/2014: tx with colchicine and prednisone.   Dyslipidemia    Dyspnea    a. 11/2014: possibly due to combo of CHF and Brilinta. Brilinta changed to Effient.   Erectile dysfunction    a. Pt aware not to take ED med within 24 hr of NTG and vice versa.   Hypertension    Ischemic cardiomyopathy    a. 11/2014: EF reportedly 20% by cath and echo in [place].   NSVT (nonsustained ventricular tachycardia) ([place]) 11/16/14   a. 11/2014: admitted after discharge from STEMI admission, 18 beats 135-140bpm in ED.   Pericardial effusion    a. 11/2014.   Pericarditis dx'd 11/2014   Pneumonia 1990's X 2   RBBB    Sleep apnea    suspected but not diagnosed (02/28/2015)   STEMI (ST elevation myocardial infarction) ([place]) 11/10/2014   Tobacco abuse    VF (ventricular fibrillation) ([place]) 11/10/14   a. 11/2014: arrest with STEMI.    Past Surgical History:  Procedure Laterality Date   A-FLUTTER ABLATION N/A 09/04/2020   Procedure: A-FLUTTER ABLATION;  Surgeon: Thompson Grayer, MD;  Location: [place] [facility];  Service: Cardiovascular;  Laterality: N/A;   ATRIAL FIBRILLATION ABLATION N/A 08/04/2022   Procedure: ATRIAL FIBRILLATION ABLATION;  Surgeon: Vickie Epley, MD;  Location: [facility];  Service: Cardiovascular;  Laterality: N/A;   CARDIAC CATHETERIZATION  11/19/2014   CARDIAC DEFIBRILLATOR PLACEMENT  02/28/2015   CORONARY ANGIOPLASTY WITH STENT PLACEMENT  11/10/2014   LAD DES   HAND SURGERY Bilateral    Dupuytren's Contracture   IMPLANTABLE CARDIOVERTER DEFIBRILLATOR IMPLANT N/A 02/28/2015   SJM Fortify Assura VR ICD implanted by Dr Rayann Heman.  Revascularized following VF arrest, EF remained  depressed and ICD implanted   KNEE ARTHROSCOPY Right 12/08/1980   LEFT HEART CATH N/A 11/19/2014   Procedure: LEFT HEART CATH;  Surgeon: Lorretta Harp, MD;  Location: [facility];  Service: Cardiovascular;  Laterality: N/A;   RIGHT/LEFT HEART CATH AND CORONARY ANGIOGRAPHY N/A 07/22/2021   Procedure: RIGHT/LEFT HEART CATH AND CORONARY ANGIOGRAPHY;  Surgeon: Jolaine Artist, MD;  Location: [place] [facility];  Service: Cardiovascular;  Laterality: N/A;    Current Outpatient Medications  Medication Sig Dispense Refill   acetaminophen (TYLENOL) 500 MG tablet Take 500 mg by mouth every 8 (eight) hours as  needed for moderate pain.     amiodarone (PACERONE) 200 MG tablet TAKE 1 TABLET(200 MG) BY MOUTH DAILY 90 tablet 3   ANORO ELLIPTA 62.5-25 MCG/INH AEPB Inhale 1 puff into the lungs in the morning.  11   apixaban (ELIQUIS) 5 MG TABS tablet Take 1 tablet (5 mg total) by mouth 2 (two) times daily. 60 tablet 6   atorvastatin (LIPITOR) 20 MG tablet TAKE 1 TABLET DAILY 90 tablet 3   colchicine 0.6 MG tablet Take 0.6 mg by mouth daily as needed (gout flare).      ENTRESTO 24-26 MG TAKE 1 TABLET BY MOUTH TWICE DAILY 180 tablet 3   furosemide (LASIX) 20 MG tablet Take 1 tablet (20 mg total) by mouth as needed. (Patient taking differently: Take 20 mg by mouth daily as needed for edema.) 30 tablet 2   levothyroxine (SYNTHROID) 100 MCG tablet Take 100 mcg by mouth daily before breakfast.     metoprolol succinate (TOPROL-XL) 25 MG 24 hr tablet Take 0.5 tablets (12.5 mg total) by mouth daily. 45 tablet 2   nitroGLYCERIN (NITROSTAT) 0.4 MG SL tablet PLACE 1 TABLET BY MOUTH UNDER TONGUE EVERY 5 MINUTES AS NEEDED CHEST PAIN 25 tablet 3   omega-3 acid ethyl esters (LOVAZA) 1 g capsule Take 2 capsules by mouth 2 (two) times daily.     pantoprazole (PROTONIX) 40 MG tablet Take 1 tablet (40 mg total) by mouth daily. 45 tablet 0   spironolactone (ALDACTONE) 25 MG tablet Take 0.5 tablets (12.5 mg total) by mouth  daily.     triamcinolone cream (KENALOG) 0.5 % Apply 1 application topically 2 (two) times daily as needed (skin irritation/rash).     No current facility-administered medications for this encounter.    Allergies:   Ambien [zolpidem tartrate], Lisinopril, and Brilinta [ticagrelor]   Social History:  The patient  reports that he quit smoking about 7 years ago. His smoking use included cigarettes. He has a 80.00 pack-year smoking history. He has never used smokeless tobacco. He reports current alcohol use of about 14.0 standard drinks of alcohol per week. He reports that he does not use drugs.   Family History:  The patient's  family history includes COPD in his mother; Crohn's disease in his brother; Heart disease in his mother; Hypertension in his father and mother; Prostate cancer in his father.   Review of systems complete and found to be negative unless listed in HPI.     Vitals:   09/03/22 1207  BP: 100/60  Pulse: (!) 54  SpO2: 97%  Weight: 96.6 kg (213 lb)    Wt Readings from Last 3 Encounters:  09/03/22 96.6 kg (213 lb)  09/02/22 95.8 kg (211 lb 3.2 oz)  08/04/22 95.3 kg (210 lb)   Physical exam: General:  Well appearing. No resp difficulty HEENT: normal Neck: supple. no JVD. Carotids 2+ bilat; no bruits. No lymphadenopathy or thryomegaly appreciated. Cor: PMI nondisplaced. Regular rate & rhythm. No rubs, gallops or murmurs. Lungs: clear Abdomen: obese soft, nontender, nondistended. No hepatosplenomegaly. No bruits or masses. Good bowel sounds. Extremities: no cyanosis, clubbing, rash, edema Neuro: alert & orientedx3, cranial nerves grossly intact. moves all 4 extremities w/o difficulty. Affect pleasant  Recent Labs: 12/11/2021: TSH 2.550 01/16/2022: ALT 20; B Natriuretic Peptide 513.3; Magnesium 2.4 07/17/2022: Hemoglobin 14.4; Platelets 312 07/30/2022: BUN 17; Creatinine, Ser 1.34; Potassium 4.6; Sodium 142   Lipid Panel     Component Value Date/Time   CHOL 127  11/07/2021 1212  CHOL 101 12/04/2014 1030   TRIG 170 (H) 11/07/2021 1212   HDL 51 11/07/2021 1212   HDL 43 12/04/2014 1030   CHOLHDL 2.5 11/07/2021 1212   VLDL 34 11/07/2021 1212   LDLCALC 42 11/07/2021 1212   LDLCALC 39 12/04/2014 1030     ASSESSMENT AND PLAN:  1. Chronic systolic due to ischemic CM  - Echo 03/2016 LVEF 20-25%, Grade 2 DD - Echo 3/18 shows with stable LVEF 20-25%. Normal RV.  - Echo 9/18 EF 20-25%, grade 2 DD  - CPX 2018 pVO2 19.6 (69%) slope 37 RER 1.06 FEV1 (68%)  - Echo 05/16/20  EF 25% RV normal. - Echo 1/23 EF 25-30%  - Doing well. Stable NYHA II  - Volume status stable on lasix 20 mg PRN - Continue Entresto 24/26.  Up-titration Limited by low BP  - Continue carvedilol 6.25 mg BID.  - Continue sprio 25 mg daily.   - Continue Farxiga 10 - ICD interrogated personally. No VT. Volume ok - Discussed repeating CPX test at next visit for risk stratification  - Recent labs reviewed. OK.   2.  CAD s/p Anterior MI December 2015:  - Cath 8/22 with stable CAD - No s/s angina - Continue ASA, statin and Effient  - Lipids per Dr. Brigitte Pulse.   3. AF/AFL - seen on zio 5% AF burden - now on amio/eliqus - Had AF ablation 08/04/22 remains on amio 200 daily. - Follows with Dr. Quentin Ore 1`01/03/22   4. H/o VT - had SVT/VT in 12/22 - I d/w EP - Device reprogrammed. Continue amio   5. Tobacco abuse:  - Remains quit  6. Emphysema - Sees pulmonary. Spirometry on CPX previously reviewed with only mild disease.   7. Snoring - Check sleep study. Has device at home.  - Encouraged him to take it    Glori Bickers, MD  12:24 PM

## 2022-09-08 ENCOUNTER — Ambulatory Visit (HOSPITAL_COMMUNITY): Admit: 2022-09-08 | Payer: PPO | Admitting: Internal Medicine

## 2022-09-08 ENCOUNTER — Encounter (HOSPITAL_COMMUNITY): Payer: Self-pay

## 2022-09-08 SURGERY — COLONOSCOPY WITH PROPOFOL
Anesthesia: Monitor Anesthesia Care

## 2022-09-12 NOTE — Progress Notes (Signed)
Remote ICD transmission.   

## 2022-09-16 ENCOUNTER — Encounter: Payer: Self-pay | Admitting: Internal Medicine

## 2022-09-20 DIAGNOSIS — Z23 Encounter for immunization: Secondary | ICD-10-CM | POA: Diagnosis not present

## 2022-11-03 ENCOUNTER — Ambulatory Visit: Payer: PPO | Admitting: Cardiology

## 2022-11-05 ENCOUNTER — Other Ambulatory Visit: Payer: Self-pay

## 2022-11-05 ENCOUNTER — Telehealth: Payer: Self-pay

## 2022-11-05 DIAGNOSIS — R195 Other fecal abnormalities: Secondary | ICD-10-CM

## 2022-11-05 NOTE — Telephone Encounter (Signed)
Patient is scheduled for his hospital colonoscopy 01/15/23.  Called to advise the patient. No answer. Left him a message.  Will reach out to him again closer to the procedure date.  Patient will need clearance if still taking Eliquis at that time.

## 2022-11-10 DIAGNOSIS — H2513 Age-related nuclear cataract, bilateral: Secondary | ICD-10-CM | POA: Diagnosis not present

## 2022-11-20 ENCOUNTER — Other Ambulatory Visit (HOSPITAL_COMMUNITY): Payer: Self-pay

## 2022-11-20 MED ORDER — ENTRESTO 24-26 MG PO TABS: 1.0000 | ORAL_TABLET | Freq: Two times a day (BID) | ORAL | 3 refills | Status: DC

## 2022-12-02 ENCOUNTER — Ambulatory Visit (INDEPENDENT_AMBULATORY_CARE_PROVIDER_SITE_OTHER): Payer: PPO

## 2022-12-02 DIAGNOSIS — I255 Ischemic cardiomyopathy: Secondary | ICD-10-CM

## 2022-12-03 LAB — CUP PACEART REMOTE DEVICE CHECK
Battery Remaining Longevity: 37 mo
Battery Remaining Percentage: 34 %
Battery Voltage: 2.96 V
Brady Statistic RV Percent Paced: 1 %
Date Time Interrogation Session: 20231227000022
HighPow Impedance: 65 Ohm
HighPow Impedance: 65 Ohm
Implantable Lead Connection Status: 753985
Implantable Lead Implant Date: 20160323
Implantable Lead Location: 753860
Implantable Pulse Generator Implant Date: 20160323
Lead Channel Impedance Value: 350 Ohm
Lead Channel Pacing Threshold Amplitude: 1 V
Lead Channel Pacing Threshold Pulse Width: 0.5 ms
Lead Channel Sensing Intrinsic Amplitude: 12 mV
Lead Channel Setting Pacing Amplitude: 2.5 V
Lead Channel Setting Pacing Pulse Width: 0.5 ms
Lead Channel Setting Sensing Sensitivity: 0.5 mV
Pulse Gen Serial Number: 7255600
Zone Setting Status: 755011

## 2022-12-25 NOTE — Progress Notes (Signed)
Remote ICD transmission.   

## 2022-12-26 ENCOUNTER — Telehealth: Payer: Self-pay

## 2022-12-26 NOTE — Telephone Encounter (Signed)
[facility] Pre-operative Risk Assessment     Request for surgical clearance:     Endoscopy Procedure  What type of surgery is being performed?     colonoscopy  When is this surgery scheduled?     01/15/23  What type of clearance is required ?   Pharmacy  Are there any medications that need to be held prior to surgery and how long? Eliquis to be held 2 days prior to procedure date  Practice name and name of physician performing surgery?      [facility]  What is your office phone and fax number?      Phone- [phone]  [phone]  Anesthesia type (None, local, MAC, general) ?       MAC

## 2022-12-26 NOTE — Telephone Encounter (Signed)
   Name: Kyle Bullock  DOB: 11-06-1951  MRN: [id number]  Primary Cardiologist: None   Preoperative team, please contact this patient and set up a phone call appointment for further preoperative risk assessment. Please obtain consent and complete medication review. Thank you for your help.  I confirm that guidance regarding antiplatelet and oral anticoagulation therapy has been completed and, if necessary, noted below.  Pharmacy has provided recommendations for anticoagulation hold.   Deberah Pelton, NP 12/26/2022, 12:47 PM [place]

## 2022-12-26 NOTE — Telephone Encounter (Signed)
Spoke with patient who informed me that he has an appointment with Dr. Quentin Ore on 1/23 and wants to have his pre-op clearance be addressed then.

## 2022-12-26 NOTE — Telephone Encounter (Signed)
Patient with diagnosis of afib on Eliquis for anticoagulation.    Procedure: colonoscopy  Date of procedure: 01/15/23   CHA2DS2-VASc Score = 4   This indicates a 4.8% annual risk of stroke. The patient's score is based upon: CHF History: 1 HTN History: 1 Diabetes History: 0 Stroke History: 0 Vascular Disease History: 1 Age Score: 1 Gender Score: 0      CrCl 68 ml/min  Per office protocol, patient can hold Eliquis for 2 days prior to procedure.     **This guidance is not considered finalized until pre-operative APP has relayed final recommendations.**

## 2022-12-26 NOTE — Telephone Encounter (Signed)
Preoperative team, patient has follow-up appointment with Dr. Quentin Ore on 1/23.  I will remove him from the preoperative pool and allow/defer preoperative cardiac evaluation to him at that time.  Please add preoperative cardiac evaluation to appointment note.  Thank you for your help.  Jossie Ng. Serjio Deupree NP-C     12/26/2022, 3:11 PM [address] Office [phone] Fax [phone]

## 2022-12-28 ENCOUNTER — Other Ambulatory Visit (HOSPITAL_COMMUNITY): Payer: Self-pay | Admitting: Internal Medicine

## 2022-12-31 ENCOUNTER — Encounter: Payer: Self-pay | Admitting: Cardiology

## 2022-12-31 ENCOUNTER — Ambulatory Visit: Payer: PPO | Attending: Cardiology | Admitting: Cardiology

## 2022-12-31 VITALS — BP 110/62 | HR 71 | Ht 75.0 in | Wt 216.0 lb

## 2022-12-31 DIAGNOSIS — I480 Paroxysmal atrial fibrillation: Secondary | ICD-10-CM | POA: Diagnosis not present

## 2022-12-31 DIAGNOSIS — Z79899 Other long term (current) drug therapy: Secondary | ICD-10-CM | POA: Diagnosis not present

## 2022-12-31 DIAGNOSIS — Z95810 Presence of automatic (implantable) cardiac defibrillator: Secondary | ICD-10-CM | POA: Diagnosis not present

## 2022-12-31 DIAGNOSIS — I5022 Chronic systolic (congestive) heart failure: Secondary | ICD-10-CM

## 2022-12-31 MED ORDER — AMIODARONE HCL 100 MG PO TABS: 100.0000 mg | ORAL_TABLET | Freq: Every day | ORAL | 3 refills | Status: DC

## 2022-12-31 NOTE — Telephone Encounter (Signed)
Called the patient's home phone. No answer. Left a message. Need to know if he is going to take the appointment for the colonoscopy.

## 2022-12-31 NOTE — Telephone Encounter (Signed)
Left message for the patient on his cell phone. Calling to discuss 01/15/23 colonoscopy if cleared by Cardiology.

## 2022-12-31 NOTE — Patient Instructions (Addendum)
Kyle Bullock medication Instructions:  Your physician has recommended you make the following change in your medication:  1) DECREASE amiodarone to 100 mg daily   *If you need a refill on your cardiac medications before your next appointment, please call your pharmacy*   Lab Work: TODAY: CMET, TSH, T4 If you have labs (blood work) drawn today and your tests are completely normal, you will receive your results only by: [place] (if you have MyChart) OR A paper copy in the mail If you have any lab test that is abnormal or we need to change your treatment, we will call you to review the results.  Follow-Up: At [facility], you and your health needs are our priority.  As part of our continuing mission to provide you with exceptional heart care, we have created designated Provider Care Teams.  These Care Teams include your primary Cardiologist (physician) and Advanced Practice Providers (APPs -  Physician Assistants and Nurse Practitioners) who all work together to provide you with the care you need, when you need it.  Your next appointment:   6 month(s)  Provider:   You will see one of the following Advanced Practice Providers on your designated Care Team:   Tommye Standard, [place]" [place], [place], NP

## 2022-12-31 NOTE — Telephone Encounter (Signed)
Patient called requesting a call back to go over his prep medication and instructions, since he has been cleared by his cardiologist.

## 2022-12-31 NOTE — Progress Notes (Addendum)
Electrophysiology Office Follow up Visit Note:    Date:  12/31/2022   ID:  Kyle Bullock, DOB 06-03-1951, MRN [id number]  PCP:  Ginger Organ., MD  [facility] Cardiologist:  None  [facility] Electrophysiologist:  Vickie Epley, MD    Interval History:    Kyle Bullock is a 72 y.o. male who presents for a follow up visit. They were last seen in clinic Apr 16, 2022.  He has a history of a dual tachycardia with atrial fibrillation and VT/VF.  He underwent a catheter ablation for his atrial fibrillation August 04, 2022.  He saw Butch Penny in the A-fib clinic September 02, 2022 and was doing well without recurrence of his arrhythmia.  He was still taking amiodarone 200 mg by mouth once daily.  Today, he is accompanied by a family member. He denies noticing any further arrhythmias. He remains compliant with his 200 mg amiodarone.  In clinic his blood pressure is well controlled at 110/62.  He denies any palpitations, chest pain, shortness of breath, or peripheral edema. No lightheadedness, headaches, syncope, orthopnea, or PND.  In early February he is scheduled for a colonoscopy.      Past Medical History:  Diagnosis Date   Abnormal TSH    a. 11/2014: felt d/t sick euthyroid.   AICD (automatic cardioverter/defibrillator) present 02/28/2015   CAD (coronary artery disease)    a. 11/2014: arrest/STEMI s/p Xience DES to the LAD and PTCA to the D1 ([place]). b. Relook cath 11/2014: no culprit, patent stent.   CHF (congestive heart failure) ([place])    Dressler's syndrome ([place])    a. 11/2014: tx with colchicine and prednisone.   Dyslipidemia    Dyspnea    a. 11/2014: possibly due to combo of CHF and Brilinta. Brilinta changed to Effient.   Erectile dysfunction    a. Pt aware not to take ED med within 24 hr of NTG and vice versa.   Hypertension    Ischemic cardiomyopathy    a. 11/2014: EF reportedly 20% by cath and echo in [place].   NSVT (nonsustained ventricular  tachycardia) ([place]) 11/16/14   a. 11/2014: admitted after discharge from STEMI admission, 18 beats 135-140bpm in ED.   Pericardial effusion    a. 11/2014.   Pericarditis dx'd 11/2014   Pneumonia 1990's X 2   RBBB    Sleep apnea    suspected but not diagnosed (02/28/2015)   STEMI (ST elevation myocardial infarction) ([place]) 11/10/2014   Tobacco abuse    VF (ventricular fibrillation) ([place]) 11/10/14   a. 11/2014: arrest with STEMI.    Past Surgical History:  Procedure Laterality Date   A-FLUTTER ABLATION N/A 09/04/2020   Procedure: A-FLUTTER ABLATION;  Surgeon: Thompson Grayer, MD;  Location: [place] [facility];  Service: Cardiovascular;  Laterality: N/A;   ATRIAL FIBRILLATION ABLATION N/A 08/04/2022   Procedure: ATRIAL FIBRILLATION ABLATION;  Surgeon: Vickie Epley, MD;  Location: [place] [facility];  Service: Cardiovascular;  Laterality: N/A;   CARDIAC CATHETERIZATION  11/19/2014   CARDIAC DEFIBRILLATOR PLACEMENT  02/28/2015   CORONARY ANGIOPLASTY WITH STENT PLACEMENT  11/10/2014   LAD DES   HAND SURGERY Bilateral    Dupuytren's Contracture   IMPLANTABLE CARDIOVERTER DEFIBRILLATOR IMPLANT N/A 02/28/2015   SJM Fortify Assura VR ICD implanted by Dr Rayann Heman.  Revascularized following VF arrest, EF remained depressed and ICD implanted   KNEE ARTHROSCOPY Right 12/08/1980   LEFT HEART CATH N/A 11/19/2014   Procedure: LEFT HEART CATH;  Surgeon: Lorretta Harp, MD;  [facility]: [facility];  Service: Cardiovascular;  Laterality: N/A;   RIGHT/LEFT HEART CATH AND CORONARY ANGIOGRAPHY N/A 07/22/2021   Procedure: RIGHT/LEFT HEART CATH AND CORONARY ANGIOGRAPHY;  Surgeon: Jolaine Artist, MD;  Location: [place] [facility];  Service: Cardiovascular;  Laterality: N/A;    Current Medications: Current Meds  Medication Sig   acetaminophen (TYLENOL) 500 MG tablet Take 500 mg by mouth every 8 (eight) hours as needed for moderate pain.   amiodarone (PACERONE) 200 MG tablet TAKE 1 TABLET(200 MG) BY  MOUTH DAILY   ANORO ELLIPTA 62.5-25 MCG/INH AEPB Inhale 1 puff into the lungs in the morning.   apixaban (ELIQUIS) 5 MG TABS tablet Take 1 tablet (5 mg total) by mouth 2 (two) times daily.   atorvastatin (LIPITOR) 20 MG tablet TAKE 1 TABLET DAILY   colchicine 0.6 MG tablet Take 0.6 mg by mouth daily as needed (gout flare).    furosemide (LASIX) 20 MG tablet TAKE 1 TABLET BY MOUTH AS NEEDED.   levothyroxine (SYNTHROID) 100 MCG tablet Take 100 mcg by mouth daily before breakfast.   metoprolol succinate (TOPROL-XL) 25 MG 24 hr tablet Take 0.5 tablets (12.5 mg total) by mouth daily.   nitroGLYCERIN (NITROSTAT) 0.4 MG SL tablet PLACE 1 TABLET BY MOUTH UNDER TONGUE EVERY 5 MINUTES AS NEEDED CHEST PAIN   omega-3 acid ethyl esters (LOVAZA) 1 g capsule Take 2 capsules by mouth 2 (two) times daily.   pantoprazole (PROTONIX) 40 MG tablet Take 1 tablet (40 mg total) by mouth daily.   sacubitril-valsartan (ENTRESTO) 24-26 MG Take 1 tablet by mouth 2 (two) times daily.   spironolactone (ALDACTONE) 25 MG tablet Take 0.5 tablets (12.5 mg total) by mouth daily.   triamcinolone cream (KENALOG) 0.5 % Apply 1 application topically 2 (two) times daily as needed (skin irritation/rash).     Allergies:   Ambien [zolpidem tartrate], Lisinopril, and Brilinta [ticagrelor]   Social History   Socioeconomic History   Marital status: Married    Spouse name: Not on file   Number of children: 2   Years of education: Not on file   Highest education level: Not on file  Occupational History   Occupation: retired  Tobacco Use   Smoking status: Former    Packs/day: 2.00    Years: 40.00    Total pack years: 80.00    Types: Cigarettes    Quit date: 11/10/2014    Years since quitting: 8.1   Smokeless tobacco: Never  Vaping Use   Vaping Use: Never used  Substance and Sexual Activity   Alcohol use: Yes    Alcohol/week: 14.0 standard drinks of alcohol    Types: 14 Glasses of wine per week    Comment: 02/28/2015 "2, 4oz  glasses of wine/night"   Drug use: No   Sexual activity: Not Currently  Other Topics Concern   Not on file  Social History Narrative   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  Now lives in [place].   Social Determinants of Health   Financial Resource Strain: Not on file  Food Insecurity: Not on file  Transportation Needs: Not on file  Physical Activity: Not on file  Stress: Not on file  Social Connections: Not on file     Family History: The patient's family history includes COPD in his mother; Crohn's disease in his brother; Heart disease in his mother; Hypertension in his father and mother; Prostate cancer in his father. There  is no history of CAD, Colon cancer, Stomach cancer, or Esophageal cancer.  ROS:   Please see the history of present illness.    All other systems reviewed and are negative.  EKGs/Labs/Other Studies Reviewed:    The following studies were reviewed today:  December 31, 2022 in clinic device interrogation personally reviewed Battery longevity 3 years Lead parameter stable Less than 1% ventricular pacing No high-voltage therapies No atrial fibrillation by daily heart rate graph    Recent Labs: 01/16/2022: ALT 20; B Natriuretic Peptide 513.3; Magnesium 2.4 07/17/2022: Hemoglobin 14.4; Platelets 312 07/30/2022: BUN 17; Creatinine, Ser 1.34; Potassium 4.6; Sodium 142   Recent Lipid Panel    Component Value Date/Time   CHOL 127 11/07/2021 1212   CHOL 101 12/04/2014 1030   TRIG 170 (H) 11/07/2021 1212   HDL 51 11/07/2021 1212   HDL 43 12/04/2014 1030   CHOLHDL 2.5 11/07/2021 1212   VLDL 34 11/07/2021 1212   LDLCALC 42 11/07/2021 1212   LDLCALC 39 12/04/2014 1030    Physical Exam:    VS:  BP 110/62   Pulse 71   Ht 6\' 3"  (1.905 m)   Wt 216 lb (98 kg)   SpO2 98%   BMI 27.00 kg/m     Wt Readings from Last 3 Encounters:  12/31/22 216 lb (98 kg)  09/03/22 213 lb (96.6 kg)  09/02/22 211 lb 3.2 oz (95.8 kg)     GEN:   Well nourished, well developed in no acute distress CARDIAC: RRR, no murmurs, rubs, gallops.  ICD pocket well-healed PSYCHIATRIC:  Normal affect        ASSESSMENT:    1. PAF (paroxysmal atrial fibrillation) (HCC)   2. Chronic systolic CHF (congestive heart failure) (HCC)   3. ICD (implantable cardioverter-defibrillator) in place   4. Encounter for long-term (current) use of high-risk medication    PLAN:    In order of problems listed above:  #Paroxysmal atrial fibrillation #VT/VF Doing well after catheter ablation for A-fib. Decrease amiodarone to 100 mg by mouth once daily Continue Eliquis  #ICD in situ Device functioning appropriately.  Continue remote monitoring  #Amiodarone monitoring Update CMP, TSH and free T4 today.  #PreOp The patient is at acceptable risk to undergo colonoscopy. He can hold his OAC for 48 hours prior to the procedure and restart when felt safe post op. We did discuss the very minor increase in stroke risk while holding anticoagulation and the patient expressed understanding.    Follow-up 6 months with APP.       Medication Adjustments/Labs and Tests Ordered: Current medicines are reviewed at length with the patient today.  Concerns regarding medicines are outlined above.   No orders of the defined types were placed in this encounter.  No orders of the defined types were placed in this encounter.  I,Mathew Stumpf,acting as a 09/04/22 for Neurosurgeon, MD.,have documented all relevant documentation on the behalf of Kyle Prude, MD,as directed by  Kyle Prude, MD while in the presence of Kyle Prude, MD.  I, Kyle Prude, MD, have reviewed all documentation for this visit. The documentation on 12/31/22 for the exam, diagnosis, procedures, and orders are all accurate and complete.   Signed, 01/02/23, MD, [facility], [facility] 12/31/2022 3:55 PM    Electrophysiology [facility]

## 2022-12-31 NOTE — Progress Notes (Deleted)
Electrophysiology Office Follow up Visit Note:    Date:  12/31/2022   ID:  Kyle Bullock, DOB 06-20-1951, MRN [id number]  PCP:  Ginger Organ., MD  [facility] Cardiologist:  None  [facility] Electrophysiologist:  Vickie Epley, MD    Interval History:    Kyle Bullock is a 72 y.o. male who presents for a follow up visit. They were last seen in clinic Apr 16, 2022.  He has a history of a dual tachycardia with atrial fibrillation and VT/VF.  He underwent a catheter ablation for his atrial fibrillation August 04, 2022.  He saw Butch Penny in the A-fib clinic September 02, 2022 and was doing well without recurrence of his arrhythmia.  He was still taking amiodarone 200 mg by mouth once daily.      Past Medical History:  Diagnosis Date   Abnormal TSH    a. 11/2014: felt d/t sick euthyroid.   AICD (automatic cardioverter/defibrillator) present 02/28/2015   CAD (coronary artery disease)    a. 11/2014: arrest/STEMI s/p Xience DES to the LAD and PTCA to the D1 ([place]). b. Relook cath 11/2014: no culprit, patent stent.   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 11/2014: tx with colchicine and prednisone.   Dyslipidemia    Dyspnea    a. 11/2014: possibly due to combo of CHF and Brilinta. Brilinta changed to Effient.   Erectile dysfunction    a. Pt aware not to take ED med within 24 hr of NTG and vice versa.   Hypertension    Ischemic cardiomyopathy    a. 11/2014: EF reportedly 20% by cath and echo in [place].   NSVT (nonsustained ventricular tachycardia) ([place]) 11/16/14   a. 11/2014: admitted after discharge from STEMI admission, 18 beats 135-140bpm in ED.   Pericardial effusion    a. 11/2014.   Pericarditis dx'd 11/2014   Pneumonia 1990's X 2   RBBB    Sleep apnea    suspected but not diagnosed (02/28/2015)   STEMI (ST elevation myocardial infarction) ([place]) 11/10/2014   Tobacco abuse    VF (ventricular fibrillation) ([place])  11/10/14   a. 11/2014: arrest with STEMI.    Past Surgical History:  Procedure Laterality Date   A-FLUTTER ABLATION N/A 09/04/2020   Procedure: A-FLUTTER ABLATION;  Surgeon: Thompson Grayer, MD;  Location: [place] [facility];  Service: Cardiovascular;  Laterality: N/A;   ATRIAL FIBRILLATION ABLATION N/A 08/04/2022   Procedure: ATRIAL FIBRILLATION ABLATION;  Surgeon: Vickie Epley, MD;  Location: [place] [facility];  Service: Cardiovascular;  Laterality: N/A;   CARDIAC CATHETERIZATION  11/19/2014   CARDIAC DEFIBRILLATOR PLACEMENT  02/28/2015   CORONARY ANGIOPLASTY WITH STENT PLACEMENT  11/10/2014   LAD DES   HAND SURGERY Bilateral    Dupuytren's Contracture   IMPLANTABLE CARDIOVERTER DEFIBRILLATOR IMPLANT N/A 02/28/2015   SJM Fortify Assura VR ICD implanted by Dr Rayann Heman.  Revascularized following VF arrest, EF remained depressed and ICD implanted   KNEE ARTHROSCOPY Right 12/08/1980   LEFT HEART CATH N/A 11/19/2014   Procedure: LEFT HEART CATH;  Surgeon: Lorretta Harp, MD;  Location: [facility] CATH LAB;  Service: Cardiovascular;  Laterality: N/A;   RIGHT/LEFT HEART CATH AND CORONARY ANGIOGRAPHY N/A 07/22/2021   Procedure: RIGHT/LEFT HEART CATH AND CORONARY ANGIOGRAPHY;  Surgeon: Jolaine Artist, MD;  Location: [place] [facility];  Service: Cardiovascular;  Laterality: N/A;    Current Medications: No outpatient medications have been marked  as taking for the 12/31/22 encounter (Appointment) with Vickie Epley, MD.     Allergies:   Ambien [zolpidem tartrate], Lisinopril, and Brilinta [ticagrelor]   Social History   Socioeconomic History   Marital status: Married    Spouse name: Not on file   Number of children: 2   Years of education: Not on file   Highest education level: Not on file  Occupational History   Occupation: retired  Tobacco Use   Smoking status: Former    Packs/day: 2.00    Years: 40.00    Total pack years: 80.00    Types: Cigarettes    Quit date:  11/10/2014    Years since quitting: 8.1   Smokeless tobacco: Never  Vaping Use   Vaping Use: Never used  Substance and Sexual Activity   Alcohol use: Yes    Alcohol/week: 14.0 standard drinks of alcohol    Types: 14 Glasses of wine per week    Comment: 02/28/2015 "2, 4oz glasses of wine/night"   Drug use: No   Sexual activity: Not Currently  Other Topics Concern   Not on file  Social History Narrative   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  Now lives in [place].   Social Determinants of Health   Financial Resource Strain: Not on file  Food Insecurity: Not on file  Transportation Needs: Not on file  Physical Activity: Not on file  Stress: Not on file  Social Connections: Not on file     Family History: The patient's family history includes COPD in his mother; Crohn's disease in his brother; Heart disease in his mother; Hypertension in his father and mother; Prostate cancer in his father. There is no history of CAD, Colon cancer, Stomach cancer, or Esophageal cancer.  ROS:   Please see the history of present illness.    All other systems reviewed and are negative.  EKGs/Labs/Other Studies Reviewed:    The following studies were reviewed today:  December 31, 2022 in clinic device interrogation personally reviewed ***    Recent Labs: 01/16/2022: ALT 20; B Natriuretic Peptide 513.3; Magnesium 2.4 07/17/2022: Hemoglobin 14.4; Platelets 312 07/30/2022: BUN 17; Creatinine, Ser 1.34; Potassium 4.6; Sodium 142  Recent Lipid Panel    Component Value Date/Time   CHOL 127 11/07/2021 1212   CHOL 101 12/04/2014 1030   TRIG 170 (H) 11/07/2021 1212   HDL 51 11/07/2021 1212   HDL 43 12/04/2014 1030   CHOLHDL 2.5 11/07/2021 1212   VLDL 34 11/07/2021 1212   LDLCALC 42 11/07/2021 1212   LDLCALC 39 12/04/2014 1030    Physical Exam:    VS:  There were no vitals taken for this visit.    Wt Readings from Last 3 Encounters:  09/03/22 213 lb (96.6 kg)   09/02/22 211 lb 3.2 oz (95.8 kg)  08/04/22 210 lb (95.3 kg)     GEN: *** Well nourished, well developed in no acute distress CARDIAC: ***RRR, no murmurs, rubs, gallops.  ICD pocket well-healed PSYCHIATRIC:  Normal affect        ASSESSMENT:    1. PAF (paroxysmal atrial fibrillation) ([place])   2. Chronic systolic CHF (congestive heart failure) ([place])   3. ICD (implantable cardioverter-defibrillator) in place    PLAN:    In order of problems listed above:  #Paroxysmal atrial fibrillation #VT/VF Doing well after catheter ablation for A-fib. ***Decrease amiodarone to 100 mg by mouth once daily Continue Eliquis  #ICD in situ Device functioning appropriately.  Continue remote monitoring  #Amiodarone monitoring Update CMP, TSH and free T4 today.  Follow-up 6 months with APP.      Medication Adjustments/Labs and Tests Ordered: Current medicines are reviewed at length with the patient today.  Concerns regarding medicines are outlined above.  No orders of the defined types were placed in this encounter.  No orders of the defined types were placed in this encounter.    Signed, Steffanie Dunn, MD, [facility], [facility] 12/31/2022 5:44 AM    Electrophysiology [facility]

## 2023-01-01 LAB — COMPREHENSIVE METABOLIC PANEL
ALT: 16 IU/L (ref 0–44)
AST: 18 IU/L (ref 0–40)
Albumin/Globulin Ratio: 2.4 — ABNORMAL HIGH (ref 1.2–2.2)
Albumin: 4.5 g/dL (ref 3.8–4.8)
Alkaline Phosphatase: 87 IU/L (ref 44–121)
BUN/Creatinine Ratio: 16 (ref 10–24)
BUN: 23 mg/dL (ref 8–27)
Bilirubin Total: 0.4 mg/dL (ref 0.0–1.2)
CO2: 22 mmol/L (ref 20–29)
Calcium: 9.4 mg/dL (ref 8.6–10.2)
Chloride: 104 mmol/L (ref 96–106)
Creatinine, Ser: 1.45 mg/dL — ABNORMAL HIGH (ref 0.76–1.27)
Globulin, Total: 1.9 g/dL (ref 1.5–4.5)
Glucose: 94 mg/dL (ref 70–99)
Potassium: 4.6 mmol/L (ref 3.5–5.2)
Sodium: 142 mmol/L (ref 134–144)
Total Protein: 6.4 g/dL (ref 6.0–8.5)
eGFR: 51 mL/min/{1.73_m2} — ABNORMAL LOW (ref >59)

## 2023-01-01 LAB — TSH: TSH: 5.05 u[IU]/mL — ABNORMAL HIGH (ref 0.450–4.500)

## 2023-01-01 LAB — T4, FREE: Free T4: 1.82 ng/dL — ABNORMAL HIGH (ref 0.82–1.77)

## 2023-01-01 NOTE — Telephone Encounter (Signed)
Spoke with the patient. He has been cleared by cardiology to hold his Eliquis for 2 days prior to his hospital colonoscopy. The patient requests a nurse visit. He has never had a colonoscopy before and is unfamiliar with a colonoscopy prep.

## 2023-01-02 ENCOUNTER — Telehealth: Payer: Self-pay | Admitting: *Deleted

## 2023-01-02 NOTE — Telephone Encounter (Signed)
Yesi,  This pt's last cardiac EF was below the minimum standard for L[facility].  His procedure will need to be done at the hospital.  Thanks,  Osvaldo Angst

## 2023-01-05 ENCOUNTER — Encounter: Payer: Self-pay | Admitting: Cardiology

## 2023-01-05 NOTE — Telephone Encounter (Signed)
Procedure at hospital on 01-15-2023 W/Dr. Lorenso Courier.

## 2023-01-07 ENCOUNTER — Encounter: Payer: Self-pay | Admitting: Internal Medicine

## 2023-01-07 ENCOUNTER — Ambulatory Visit (AMBULATORY_SURGERY_CENTER): Payer: PPO | Admitting: *Deleted

## 2023-01-07 VITALS — Ht 75.0 in | Wt 212.0 lb

## 2023-01-07 DIAGNOSIS — Z1211 Encounter for screening for malignant neoplasm of colon: Secondary | ICD-10-CM

## 2023-01-07 MED ORDER — PEG 3350-KCL-NA BICARB-NACL 420 G PO SOLR: 4000.0000 mL | Freq: Once | ORAL | 0 refills | Status: AC

## 2023-01-07 NOTE — Progress Notes (Signed)
No egg or soy allergy known to patient  No issues known to pt with past sedation with any surgeries or procedures Patient denies ever being told they had issues or difficulty with intubation  No FH of Malignant Hyperthermia Pt is not on diet pills Pt is not on  home 02  Pt is not on blood thinners  Pt denies issues with constipation  Pt is not on dialysis Pt denies any upcoming cardiac testing Pt encouraged to use to use Singlecare or Goodrx to reduce cost  Patient's chart reviewed by John Nulty CNRA prior to previsit and patient appropriate for the [facility].  Previsit completed and red dot placed by patient's name on their procedure day (on provider's schedule).  . Visit by phone Instructions reviewed with pt and pt states understanding. Instructed to review again prior to procedure. Pt states they will.  Instructions sent by mail 

## 2023-01-08 ENCOUNTER — Encounter (HOSPITAL_COMMUNITY): Payer: Self-pay | Admitting: Internal Medicine

## 2023-01-09 NOTE — Progress Notes (Signed)
Attempted to obtain medical history via telephone, unable to reach at this time. HIPAA compliant voicemail message left requesting return call to pre surgical testing department. 

## 2023-01-13 ENCOUNTER — Other Ambulatory Visit: Payer: Self-pay

## 2023-01-13 ENCOUNTER — Telehealth: Payer: Self-pay | Admitting: Internal Medicine

## 2023-01-13 NOTE — Telephone Encounter (Signed)
Patient moved off the scheduled. Offered 01/29/23. Patient declines because he will be traveling at that time. He was offered 02/19/23. He declines because he has another appointment on that day.  We will look to April and find him a date that he can have his procedure.

## 2023-01-13 NOTE — Telephone Encounter (Signed)
Inbound call from patient wanting to reschedule hospital procedure due to being exposed to Covid. Patient states he is not felling well at the moment. Please advise.  Thank you

## 2023-01-14 NOTE — Telephone Encounter (Signed)
Scheduled for 03/16/23 at 7:30 am at [facility]. Patient contacted and accepts this date. New instructions will be mailed.

## 2023-01-15 ENCOUNTER — Encounter (HOSPITAL_COMMUNITY): Payer: Self-pay | Admitting: *Deleted

## 2023-01-24 ENCOUNTER — Other Ambulatory Visit: Payer: Self-pay | Admitting: Internal Medicine

## 2023-02-05 DIAGNOSIS — Z125 Encounter for screening for malignant neoplasm of prostate: Secondary | ICD-10-CM | POA: Diagnosis not present

## 2023-02-05 DIAGNOSIS — E039 Hypothyroidism, unspecified: Secondary | ICD-10-CM | POA: Diagnosis not present

## 2023-02-05 DIAGNOSIS — I10 Essential (primary) hypertension: Secondary | ICD-10-CM | POA: Diagnosis not present

## 2023-02-05 DIAGNOSIS — R7301 Impaired fasting glucose: Secondary | ICD-10-CM | POA: Diagnosis not present

## 2023-02-05 DIAGNOSIS — M109 Gout, unspecified: Secondary | ICD-10-CM | POA: Diagnosis not present

## 2023-02-05 DIAGNOSIS — E785 Hyperlipidemia, unspecified: Secondary | ICD-10-CM | POA: Diagnosis not present

## 2023-02-12 DIAGNOSIS — Z1331 Encounter for screening for depression: Secondary | ICD-10-CM | POA: Diagnosis not present

## 2023-02-12 DIAGNOSIS — Z23 Encounter for immunization: Secondary | ICD-10-CM | POA: Diagnosis not present

## 2023-02-12 DIAGNOSIS — F17201 Nicotine dependence, unspecified, in remission: Secondary | ICD-10-CM | POA: Diagnosis not present

## 2023-02-12 DIAGNOSIS — N1831 Chronic kidney disease, stage 3a: Secondary | ICD-10-CM | POA: Diagnosis not present

## 2023-02-12 DIAGNOSIS — I5022 Chronic systolic (congestive) heart failure: Secondary | ICD-10-CM | POA: Diagnosis not present

## 2023-02-12 DIAGNOSIS — I130 Hypertensive heart and chronic kidney disease with heart failure and stage 1 through stage 4 chronic kidney disease, or unspecified chronic kidney disease: Secondary | ICD-10-CM | POA: Diagnosis not present

## 2023-02-12 DIAGNOSIS — R7301 Impaired fasting glucose: Secondary | ICD-10-CM | POA: Diagnosis not present

## 2023-02-12 DIAGNOSIS — Z0000 Encounter for general adult medical examination without abnormal findings: Secondary | ICD-10-CM | POA: Diagnosis not present

## 2023-02-12 DIAGNOSIS — I2510 Atherosclerotic heart disease of native coronary artery without angina pectoris: Secondary | ICD-10-CM | POA: Diagnosis not present

## 2023-02-12 DIAGNOSIS — G629 Polyneuropathy, unspecified: Secondary | ICD-10-CM | POA: Diagnosis not present

## 2023-02-12 DIAGNOSIS — Z1339 Encounter for screening examination for other mental health and behavioral disorders: Secondary | ICD-10-CM | POA: Diagnosis not present

## 2023-02-12 DIAGNOSIS — I700 Atherosclerosis of aorta: Secondary | ICD-10-CM | POA: Diagnosis not present

## 2023-02-12 DIAGNOSIS — J432 Centrilobular emphysema: Secondary | ICD-10-CM | POA: Diagnosis not present

## 2023-02-12 DIAGNOSIS — I10 Essential (primary) hypertension: Secondary | ICD-10-CM | POA: Diagnosis not present

## 2023-03-03 ENCOUNTER — Ambulatory Visit (INDEPENDENT_AMBULATORY_CARE_PROVIDER_SITE_OTHER): Payer: PPO

## 2023-03-03 DIAGNOSIS — I255 Ischemic cardiomyopathy: Secondary | ICD-10-CM | POA: Diagnosis not present

## 2023-03-03 LAB — CUP PACEART REMOTE DEVICE CHECK
Battery Remaining Longevity: 35 mo
Battery Remaining Percentage: 33 %
Battery Voltage: 2.96 V
Brady Statistic RV Percent Paced: 1 %
Date Time Interrogation Session: 20240326020016
HighPow Impedance: 65 Ohm
HighPow Impedance: 65 Ohm
Implantable Lead Connection Status: 753985
Implantable Lead Implant Date: 20160323
Implantable Lead Location: 753860
Implantable Pulse Generator Implant Date: 20160323
Lead Channel Impedance Value: 350 Ohm
Lead Channel Pacing Threshold Amplitude: 0.75 V
Lead Channel Pacing Threshold Pulse Width: 0.5 ms
Lead Channel Sensing Intrinsic Amplitude: 12 mV
Lead Channel Setting Pacing Amplitude: 2.5 V
Lead Channel Setting Pacing Pulse Width: 0.5 ms
Lead Channel Setting Sensing Sensitivity: 0.5 mV
Pulse Gen Serial Number: 7255600
Zone Setting Status: 755011

## 2023-03-04 ENCOUNTER — Other Ambulatory Visit: Payer: Self-pay

## 2023-03-04 MED ORDER — NA SULFATE-K SULFATE-MG SULF 17.5-3.13-1.6 GM/177ML PO SOLN: ORAL | 0 refills | Status: DC

## 2023-03-10 ENCOUNTER — Encounter (HOSPITAL_COMMUNITY): Payer: Self-pay | Admitting: Internal Medicine

## 2023-03-10 NOTE — Progress Notes (Signed)
Attempted to obtain medical history via telephone, unable to reach at this time. HIPAA compliant voicemail message left requesting return call to pre surgical testing department. 

## 2023-03-13 ENCOUNTER — Other Ambulatory Visit: Payer: Self-pay

## 2023-03-13 DIAGNOSIS — I4891 Unspecified atrial fibrillation: Secondary | ICD-10-CM

## 2023-03-13 MED ORDER — APIXABAN 5 MG PO TABS: 5.0000 mg | ORAL_TABLET | Freq: Two times a day (BID) | ORAL | 5 refills | Status: DC

## 2023-03-13 NOTE — Telephone Encounter (Signed)
Prescription refill request for Eliquis received. Indication: Afib  Last office visit: 12/31/22 Kyle Bullock)  Scr: 1.45 (12/31/22)  Age: 72 Weight: 96.2kg  Appropriate dose. Refill sent.

## 2023-03-15 ENCOUNTER — Encounter (HOSPITAL_COMMUNITY): Payer: Self-pay | Admitting: Internal Medicine

## 2023-03-15 NOTE — Anesthesia Preprocedure Evaluation (Signed)
Anesthesia Evaluation  Patient identified by MRN, date of birth, ID band Patient awake    Reviewed: Allergy & Precautions, NPO status , Patient's Chart, lab work & pertinent test results  History of Anesthesia Complications Negative for: history of anesthetic complications  Airway Mallampati: III  TM Distance: >3 FB Neck ROM: Full   Comment: Previous airway grade I with Miller 2, easy mask Dental  (+) Dental Advisory Given   Pulmonary neg shortness of breath, sleep apnea , COPD, neg recent URI, former smoker Multiple pulmonary nodules   Pulmonary exam normal breath sounds clear to auscultation       Cardiovascular hypertension (metoprolol, sacubitril-valsartan, furosemid, spironolactone), Pt. on home beta blockers (-) angina + CAD, + Past MI (STEMI/cardiac arrest 11/2014), + Cardiac Stents (DES) and +CHF (EF 25-30%)  + dysrhythmias (atrial fibrillation s/p ablation 08/04/2022, on amiodarone an Eliquis; 1st Degree AV block, RBBB) Atrial Fibrillation and Ventricular Fibrillation + Cardiac Defibrillator  Rhythm:Regular Rate:Normal  HLD, h/o pericarditis 2015  TTE 01/06/2022: IMPRESSIONS     1. Left ventricular ejection fraction, by estimation, is 25 to 30%. The  left ventricle has severely decreased function. The left ventricle  demonstrates regional wall motion abnormalities (Septal defect suggestive  of LAD disease). The left ventricular  internal cavity size was mildly dilated. Left ventricular diastolic  parameters are consistent with Grade III diastolic dysfunction  (restrictive).   2. Right ventricular systolic function is normal. The right ventricular  size is normal. There is normal pulmonary artery systolic pressure.   3. Left atrial size was mildly dilated.   4. The mitral valve is grossly normal. No evidence of mitral valve  regurgitation. No evidence of mitral stenosis.   5. The aortic valve is tricuspid. Aortic valve  regurgitation is not  visualized. No aortic stenosis is present.   6. The inferior vena cava is normal in size with greater than 50%  respiratory variability, suggesting right atrial pressure of 3 mmHg.   Cath 07/22/2021: Assessment: 1. CAD with widely patent LAD stent 2. Non-obstructive CAD in RCA and LCX with ~ 60% lesion throughout midRCA 3. ICM with EF 25% 4. Low filling pressures with preserved CO     Neuro/Psych negative neurological ROS     GI/Hepatic Neg liver ROS, Bowel prep,,,  Endo/Other  Hypothyroidism    Renal/GU negative Renal ROS     Musculoskeletal   Abdominal   Peds  Hematology   Anesthesia Other Findings Last Eliquis: 03/13/2023  Reproductive/Obstetrics                             Anesthesia Physical Anesthesia Plan  ASA: 4  Anesthesia Plan: MAC   Post-op Pain Management:    Induction: Intravenous  PONV Risk Score and Plan: 1 and Propofol infusion and Treatment may vary due to age or medical condition  Airway Management Planned: Natural Airway and Nasal Cannula  Additional Equipment:   Intra-op Plan:   Post-operative Plan:   Informed Consent: I have reviewed the patients History and Physical, chart, labs and discussed the procedure including the risks, benefits and alternatives for the proposed anesthesia with the patient or authorized representative who has indicated his/her understanding and acceptance.       Plan Discussed with: CRNA and Anesthesiologist  Anesthesia Plan Comments: (Discussed with patient risks of MAC including, but not limited to, minor pain or discomfort, hearing people in the room, and possible need for backup general anesthesia. Risks for  general anesthesia also discussed including, but not limited to, sore throat, hoarse voice, chipped/damaged teeth, injury to vocal cords, nausea and vomiting, allergic reactions, lung infection, heart attack, stroke, and death. All questions answered. )        Anesthesia Quick Evaluation

## 2023-03-16 ENCOUNTER — Ambulatory Visit (HOSPITAL_COMMUNITY)
Admission: RE | Admit: 2023-03-16 | Discharge: 2023-03-16 | Disposition: A | Discharge status: Home / Self Care | Payer: PPO | Attending: Internal Medicine | Admitting: Internal Medicine

## 2023-03-16 ENCOUNTER — Encounter (HOSPITAL_COMMUNITY)
Admission: RE | Disposition: A | Discharge status: Home / Self Care | Payer: Self-pay | Source: Home / Self Care | Attending: Internal Medicine

## 2023-03-16 ENCOUNTER — Ambulatory Visit (HOSPITAL_COMMUNITY): Payer: PPO | Admitting: Anesthesiology

## 2023-03-16 ENCOUNTER — Ambulatory Visit (HOSPITAL_BASED_OUTPATIENT_CLINIC_OR_DEPARTMENT_OTHER): Payer: PPO | Admitting: Anesthesiology

## 2023-03-16 ENCOUNTER — Encounter (HOSPITAL_COMMUNITY): Payer: Self-pay | Admitting: Internal Medicine

## 2023-03-16 ENCOUNTER — Other Ambulatory Visit: Payer: Self-pay

## 2023-03-16 DIAGNOSIS — K648 Other hemorrhoids: Secondary | ICD-10-CM | POA: Insufficient documentation

## 2023-03-16 DIAGNOSIS — D125 Benign neoplasm of sigmoid colon: Secondary | ICD-10-CM

## 2023-03-16 DIAGNOSIS — Z8674 Personal history of sudden cardiac arrest: Secondary | ICD-10-CM | POA: Insufficient documentation

## 2023-03-16 DIAGNOSIS — E039 Hypothyroidism, unspecified: Secondary | ICD-10-CM | POA: Diagnosis not present

## 2023-03-16 DIAGNOSIS — D120 Benign neoplasm of cecum: Secondary | ICD-10-CM | POA: Diagnosis not present

## 2023-03-16 DIAGNOSIS — I4901 Ventricular fibrillation: Secondary | ICD-10-CM | POA: Insufficient documentation

## 2023-03-16 DIAGNOSIS — Z79899 Other long term (current) drug therapy: Secondary | ICD-10-CM | POA: Diagnosis not present

## 2023-03-16 DIAGNOSIS — Z87891 Personal history of nicotine dependence: Secondary | ICD-10-CM

## 2023-03-16 DIAGNOSIS — I252 Old myocardial infarction: Secondary | ICD-10-CM | POA: Diagnosis not present

## 2023-03-16 DIAGNOSIS — K635 Polyp of colon: Secondary | ICD-10-CM | POA: Diagnosis not present

## 2023-03-16 DIAGNOSIS — I110 Hypertensive heart disease with heart failure: Secondary | ICD-10-CM | POA: Insufficient documentation

## 2023-03-16 DIAGNOSIS — D128 Benign neoplasm of rectum: Secondary | ICD-10-CM

## 2023-03-16 DIAGNOSIS — Z95810 Presence of automatic (implantable) cardiac defibrillator: Secondary | ICD-10-CM | POA: Insufficient documentation

## 2023-03-16 DIAGNOSIS — J449 Chronic obstructive pulmonary disease, unspecified: Secondary | ICD-10-CM | POA: Insufficient documentation

## 2023-03-16 DIAGNOSIS — K621 Rectal polyp: Secondary | ICD-10-CM | POA: Diagnosis not present

## 2023-03-16 DIAGNOSIS — D123 Benign neoplasm of transverse colon: Secondary | ICD-10-CM

## 2023-03-16 DIAGNOSIS — I4891 Unspecified atrial fibrillation: Secondary | ICD-10-CM | POA: Insufficient documentation

## 2023-03-16 DIAGNOSIS — D122 Benign neoplasm of ascending colon: Secondary | ICD-10-CM | POA: Insufficient documentation

## 2023-03-16 DIAGNOSIS — R195 Other fecal abnormalities: Secondary | ICD-10-CM | POA: Diagnosis not present

## 2023-03-16 DIAGNOSIS — I440 Atrioventricular block, first degree: Secondary | ICD-10-CM | POA: Diagnosis not present

## 2023-03-16 DIAGNOSIS — I2510 Atherosclerotic heart disease of native coronary artery without angina pectoris: Secondary | ICD-10-CM | POA: Diagnosis not present

## 2023-03-16 DIAGNOSIS — Z7901 Long term (current) use of anticoagulants: Secondary | ICD-10-CM | POA: Insufficient documentation

## 2023-03-16 DIAGNOSIS — Z1211 Encounter for screening for malignant neoplasm of colon: Secondary | ICD-10-CM | POA: Diagnosis not present

## 2023-03-16 DIAGNOSIS — Z955 Presence of coronary angioplasty implant and graft: Secondary | ICD-10-CM | POA: Diagnosis not present

## 2023-03-16 DIAGNOSIS — I509 Heart failure, unspecified: Secondary | ICD-10-CM | POA: Insufficient documentation

## 2023-03-16 DIAGNOSIS — G4730 Sleep apnea, unspecified: Secondary | ICD-10-CM | POA: Insufficient documentation

## 2023-03-16 DIAGNOSIS — E785 Hyperlipidemia, unspecified: Secondary | ICD-10-CM | POA: Diagnosis not present

## 2023-03-16 HISTORY — PX: COLONOSCOPY WITH PROPOFOL: SHX5780

## 2023-03-16 HISTORY — PX: POLYPECTOMY: SHX5525

## 2023-03-16 SURGERY — COLONOSCOPY WITH PROPOFOL
Anesthesia: Monitor Anesthesia Care

## 2023-03-16 MED ORDER — LIDOCAINE HCL (CARDIAC) PF 100 MG/5ML IV SOSY
PREFILLED_SYRINGE | INTRAVENOUS | Status: DC | PRN
  Administered 2023-03-16: 80 mg via INTRAVENOUS

## 2023-03-16 MED ORDER — PROPOFOL 500 MG/50ML IV EMUL
INTRAVENOUS | Status: DC | PRN
  Administered 2023-03-16: 100 ug/kg/min via INTRAVENOUS

## 2023-03-16 MED ORDER — PROPOFOL 500 MG/50ML IV EMUL
INTRAVENOUS | Status: AC
  Filled 2023-03-16: qty 100

## 2023-03-16 MED ORDER — SODIUM CHLORIDE 0.9 % IV SOLN: INTRAVENOUS | Status: DC

## 2023-03-16 MED ORDER — EPHEDRINE SULFATE (PRESSORS) 50 MG/ML IJ SOLN
INTRAMUSCULAR | Status: DC | PRN
  Administered 2023-03-16 (×2): 10 mg via INTRAVENOUS
  Administered 2023-03-16: 5 mg via INTRAVENOUS

## 2023-03-16 MED ORDER — PHENYLEPHRINE HCL (PRESSORS) 10 MG/ML IV SOLN
INTRAVENOUS | Status: DC | PRN
  Administered 2023-03-16 (×4): 80 ug via INTRAVENOUS

## 2023-03-16 MED ORDER — LACTATED RINGERS IV SOLN
INTRAVENOUS | Status: DC
  Administered 2023-03-16: 1000 mL via INTRAVENOUS

## 2023-03-16 MED ORDER — PROPOFOL 1000 MG/100ML IV EMUL
INTRAVENOUS | Status: AC
  Filled 2023-03-16: qty 200

## 2023-03-16 SURGICAL SUPPLY — 22 items

## 2023-03-16 NOTE — Op Note (Addendum)
[facility] Patient Name: Kyle Bullock Procedure Date: 03/16/2023 MRN: [id number] Attending MD: Particia Lather , , [id number] Date of Birth: [birth year] CSN: [id number] Age: 72 Admit Type: Outpatient Procedure:                Colonoscopy Indications:              Positive Cologuard test Providers:                Madelyn Brunner" Marisa Severin, RN, Rozetta Nunnery, Technician Referring MD:             Martha Clan Medicines:                Monitored Anesthesia Care Complications:            No immediate complications. Estimated Blood Loss:     Estimated blood loss was minimal. Procedure:                Pre-Anesthesia Assessment:                           - Prior to the procedure, a History and Physical                            was performed, and patient medications and                            allergies were reviewed. The patient's tolerance of                            previous anesthesia was also reviewed. The risks                            and benefits of the procedure and the sedation                            options and risks were discussed with the patient.                            All questions were answered, and informed consent                            was obtained. Prior Anticoagulants: The patient has                            taken Eliquis (apixaban), last dose was 3 days                            prior to procedure. ASA Grade Assessment: III - A                            patient with severe systemic disease. After  reviewing the risks and benefits, the patient was                            deemed in satisfactory condition to undergo the                            procedure.                           After obtaining informed consent, the colonoscope                            was passed under direct vision. Throughout the                            procedure, the patient's  blood pressure, pulse, and                            oxygen saturations were monitored continuously. The                            CF-HQ190L ([id number]) Olympus colonoscope was                            introduced through the anus and advanced to the the                            cecum, identified by appendiceal orifice and                            ileocecal valve. The colonoscopy was performed                            without difficulty. The patient tolerated the                            procedure well. Scope In: 8:03:30 AM Scope Out: 8:32:24 AM Scope Withdrawal Time: 0 hours 20 minutes 23 seconds  Total Procedure Duration: 0 hours 28 minutes 54 seconds  Findings:      Three sessile polyps were found in the transverse colon, ascending colon       and cecum. The polyps were 4 to 11 mm in size. These polyps were removed       with a cold snare. Resection and retrieval were complete.      Two sessile polyps were found in the rectum and sigmoid colon. The       polyps were 3 to 8 mm in size. These polyps were removed with a cold       snare. Resection and retrieval were complete.      Non-bleeding internal hemorrhoids were found during retroflexion. Impression:               - Three 4 to 11 mm polyps in the transverse colon,                            in the ascending colon  and in the cecum, removed                            with a cold snare. Resected and retrieved.                           - Two 3 to 8 mm polyps in the rectum and in the                            sigmoid colon, removed with a cold snare. Resected                            and retrieved.                           - Non-bleeding internal hemorrhoids. Moderate Sedation:      Not Applicable - Patient had care per Anesthesia. Recommendation:           - Discharge patient to home (with escort).                           - Await pathology results.                           - Okay to restart Eliquis tomorrow.                            - The findings and recommendations were discussed                            with the patient. Procedure Code(s):        --- Professional ---                           [id number], Colonoscopy, flexible; with removal of                            tumor(s), polyp(s), or other lesion(s) by snare                            technique Diagnosis Code(s):        --- Professional ---                           D12.3, Benign neoplasm of transverse colon (hepatic                            flexure or splenic flexure)                           D12.2, Benign neoplasm of ascending colon                           D12.0, Benign neoplasm of cecum  D12.8, Benign neoplasm of rectum                           D12.5, Benign neoplasm of sigmoid colon                           K64.8, Other hemorrhoids                           R19.5, Other fecal abnormalities CPT copyright 2022 American Medical Association. All rights reserved. The codes documented in this report are preliminary and upon coder review may  be revised to meet current compliance requirements. Dr Particia Lather "Alan Ripper" Leonides Schanz,  03/16/2023 8:46:30 AM Number of Addenda: 0

## 2023-03-16 NOTE — Discharge Instructions (Signed)

## 2023-03-16 NOTE — Transfer of Care (Signed)
Immediate Anesthesia Transfer of Care Note  Patient: Kyle Bullock  Procedure(s) Performed: Procedure(s): COLONOSCOPY WITH PROPOFOL (N/A) POLYPECTOMY  Patient Location: PACU  Anesthesia Type:MAC  Level of Consciousness:  sedated, patient cooperative and responds to stimulation  Airway & Oxygen Therapy:Patient Spontanous Breathing and Patient connected to face mask oxgen  Post-op Assessment:  Report given to PACU RN and Post -op Vital signs reviewed and stable  Post vital signs:  Reviewed and stable  Last Vitals:  Vitals:   03/16/23 0700  BP: 136/67  Pulse: (!) 54  Resp: 20  Temp: (!) 36.4 C  SpO2: 99%    Complications: No apparent anesthesia complications

## 2023-03-16 NOTE — Anesthesia Postprocedure Evaluation (Signed)
Anesthesia Post Note  Patient: Kyle Bullock  Procedure(s) Performed: COLONOSCOPY WITH PROPOFOL POLYPECTOMY     Patient location during evaluation: PACU Anesthesia Type: MAC Level of consciousness: awake Pain management: pain level controlled Vital Signs Assessment: post-procedure vital signs reviewed and stable Respiratory status: spontaneous breathing, nonlabored ventilation and respiratory function stable Cardiovascular status: stable and blood pressure returned to baseline Postop Assessment: no apparent nausea or vomiting Anesthetic complications: no   No notable events documented.  Last Vitals:  Vitals:   03/16/23 0840 03/16/23 0850  BP: (!) 111/58 119/62  Pulse: 64 66  Resp: (!) 21 20  Temp:    SpO2: 100% 96%    Last Pain:  Vitals:   03/16/23 0850  TempSrc:   PainSc: 0-No pain                 Linton Rump

## 2023-03-16 NOTE — H&P (Signed)
GASTROENTEROLOGY PROCEDURE H&P NOTE   Primary Care Physician: Cleatis Polka., MD    Reason for Procedure:   Positive Cologuard  Plan:    Colonoscopy  Patient is appropriate for endoscopic procedure(s) in the hospital setting.  The nature of the procedure, as well as the risks, benefits, and alternatives were carefully and thoroughly reviewed with the patient. Ample time for discussion and questions allowed. The patient understood, was satisfied, and agreed to proceed.     HPI: Kyle Bullock is a 72 y.o. male who presents for colonoscopy for evaluation of positive Cologuard .  Patient was most recently seen in the Gastroenterology Clinic on 06/18/22.  No interval change in medical history since that appointment. Please refer to that note for full details regarding GI history and clinical presentation.   Past Medical History:  Diagnosis Date   Abnormal TSH    a. 11/2014: felt d/t sick euthyroid.   AICD (automatic cardioverter/defibrillator) present 02/28/2015   CAD (coronary artery disease)    a. 11/2014: arrest/STEMI s/p Xience DES to the LAD and PTCA to the D1 [place], [place]). b. Relook cath 11/2014: no culprit, patent stent.   CHF (congestive heart failure)    Dressler's syndrome    a. 11/2014: tx with colchicine and prednisone.   Dyslipidemia    Dyspnea    a. 11/2014: possibly due to combo of CHF and Brilinta. Brilinta changed to Effient.   Erectile dysfunction    a. Pt aware not to take ED med within 24 hr of NTG and vice versa.   Hypertension    Ischemic cardiomyopathy    a. 11/2014: EF reportedly 20% by cath and echo in [place].   NSVT (nonsustained ventricular tachycardia) 11/16/14   a. 11/2014: admitted after discharge from STEMI admission, 18 beats 135-140bpm in ED.   Pericardial effusion    a. 11/2014.   Pericarditis dx'd 11/2014   Pneumonia 1990's X 2   RBBB    Sleep apnea    suspected but not diagnosed (02/28/2015)   STEMI (ST elevation myocardial  infarction) 11/10/2014   Tobacco abuse    VF (ventricular fibrillation) 11/10/14   a. 11/2014: arrest with STEMI.    Past Surgical History:  Procedure Laterality Date   A-FLUTTER ABLATION N/A 09/04/2020   Procedure: A-FLUTTER ABLATION;  Surgeon: Hillis Range, MD;  Location: [facility] INVASIVE CV LAB;  Service: Cardiovascular;  Laterality: N/A;   ATRIAL FIBRILLATION ABLATION N/A 08/04/2022   Procedure: ATRIAL FIBRILLATION ABLATION;  Surgeon: Lanier Prude, MD;  Location: [facility] INVASIVE CV LAB;  Service: Cardiovascular;  Laterality: N/A;   CARDIAC CATHETERIZATION  11/19/2014   CARDIAC DEFIBRILLATOR PLACEMENT  02/28/2015   CORONARY ANGIOPLASTY WITH STENT PLACEMENT  11/10/2014   LAD DES   HAND SURGERY Bilateral    Dupuytren's Contracture   IMPLANTABLE CARDIOVERTER DEFIBRILLATOR IMPLANT N/A 02/28/2015   SJM Fortify Assura VR ICD implanted by Dr Johney Frame.  Revascularized following VF arrest, EF remained depressed and ICD implanted   KNEE ARTHROSCOPY Right 12/08/1980   LEFT HEART CATH N/A 11/19/2014   Procedure: LEFT HEART CATH;  Surgeon: Runell Gess, MD;  Location: [facility] CATH LAB;  Service: Cardiovascular;  Laterality: N/A;   RIGHT/LEFT HEART CATH AND CORONARY ANGIOGRAPHY N/A 07/22/2021   Procedure: RIGHT/LEFT HEART CATH AND CORONARY ANGIOGRAPHY;  Surgeon: Dolores Patty, MD;  Location: [facility] INVASIVE CV LAB;  Service: Cardiovascular;  Laterality: N/A;    Prior to Admission medications   Medication Sig Start Date End Date Taking? Authorizing  Provider  acetaminophen (TYLENOL) 500 MG tablet Take 500 mg by mouth every 8 (eight) hours as needed for moderate pain.   Yes [provider]  amiodarone (PACERONE) 100 MG tablet Take 1 tablet (100 mg total) by mouth daily. 12/31/22  Yes Lanier Prude, MD  atorvastatin (LIPITOR) 20 MG tablet TAKE 1 TABLET BY MOUTH DAILY 01/26/23  Yes [name], Bevelyn Buckles, MD  furosemide (LASIX) 20 MG tablet TAKE 1 TABLET BY MOUTH AS NEEDED. 12/29/22  Yes  [name], Bevelyn Buckles, MD  levothyroxine (SYNTHROID) 112 MCG tablet Take 112 mcg by mouth daily before breakfast. 01/30/22  Yes [provider]  metoprolol succinate (TOPROL-XL) 25 MG 24 hr tablet Take 0.5 tablets (12.5 mg total) by mouth daily. 08/12/22  Yes Lanier Prude, MD  Na Sulfate-K Sulfate-Mg Sulf 17.5-3.13-1.6 GM/177ML SOLN Follow doctor instructions for split prep dosing 03/04/23   Imogene Burn, MD  omega-3 acid ethyl esters (LOVAZA) 1 g capsule Take 2 g by mouth 2 (two) times daily. 04/23/22  Yes [provider]  sacubitril-valsartan (ENTRESTO) 24-26 MG Take 1 tablet by mouth 2 (two) times daily. 11/20/22  Yes [name], Bevelyn Buckles, MD  spironolactone (ALDACTONE) 25 MG tablet Take 0.5 tablets (12.5 mg total) by mouth daily. 09/02/22  Yes Newman Nip, NP  TRELEGY ELLIPTA 100-62.5-25 MCG/ACT AEPB Take 1 puff by mouth daily. 02/12/23  Yes [provider]  apixaban (ELIQUIS) 5 MG TABS tablet Take 1 tablet (5 mg total) by mouth 2 (two) times daily. 03/13/23   Lanier Prude, MD  nitroGLYCERIN (NITROSTAT) 0.4 MG SL tablet PLACE 1 TABLET BY MOUTH UNDER TONGUE EVERY 5 MINUTES AS NEEDED CHEST PAIN 01/20/22   [name], Bevelyn Buckles, MD    Current Facility-Administered Medications  Medication Dose Route Frequency Provider Last Rate Last Admin   0.9 %  sodium chloride infusion   Intravenous Continuous Imogene Burn, MD       lactated ringers infusion   Intravenous Continuous Imogene Burn, MD 10 mL/hr at 03/16/23 2841 Restarted at 03/16/23 0719    Allergies as of 11/05/2022 - Review Complete 09/03/2022  Allergen Reaction Noted   Ambien [zolpidem tartrate] Anxiety and Other (See Comments) 11/27/2014   Lisinopril Cough 11/23/2014   Brilinta [ticagrelor] Cough 11/17/2014    Family History  Problem Relation Age of Onset   COPD Mother    Hypertension Mother    Heart disease Mother    Hypertension Father    Prostate cancer Father    Crohn's disease Brother    CAD  Neg Hx    Colon cancer Neg Hx    Stomach cancer Neg Hx    Esophageal cancer Neg Hx     Social History   Socioeconomic History   Marital status: Married    Spouse name: Not on file   Number of children: 2   Years of education: Not on file   Highest education level: Not on file  Occupational History   Occupation: retired  Tobacco Use   Smoking status: Former    Packs/day: 2.00    Years: 40.00    Additional pack years: 0.00    Total pack years: 80.00    Types: Cigarettes    Quit date: 11/10/2014    Years since quitting: 8.3   Smokeless tobacco: Never  Vaping Use   Vaping Use: Never used  Substance and Sexual Activity   Alcohol use: Yes    Alcohol/week: 14.0 standard drinks of alcohol    Types:  14 Glasses of wine per week    Comment: 02/28/2015 "2, 4oz glasses of wine/night"   Drug use: No   Sexual activity: Not Currently  Other Topics Concern   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  Now lives in [place].   Social Determinants of Health   Financial Resource Strain: Not on file  Food Insecurity: Not on file  Transportation Needs: Not on file  Physical Activity: Not on file  Stress: Not on file  Social Connections: Not on file  Intimate Partner Violence: Not on file    Physical Exam: Vital signs in last 24 hours: BP 136/67   Pulse (!) 54   Temp (!) 97.5 F (36.4 C) (Tympanic)   Resp 20   Ht 6\' 3"  (1.905 m)   Wt 96.2 kg   SpO2 99%   BMI 26.51 kg/m  GEN: NAD EYE: Sclerae anicteric ENT: MMM CV: Non-tachycardic Pulm: No increased WOB GI: Soft NEURO:  Alert & Oriented   Eulah Pontlaire Laniesha Das, MD [facility]   03/16/2023 7:51 AM

## 2023-03-17 ENCOUNTER — Encounter: Payer: Self-pay | Admitting: Internal Medicine

## 2023-03-17 LAB — SURGICAL PATHOLOGY

## 2023-03-18 ENCOUNTER — Encounter (HOSPITAL_COMMUNITY): Payer: Self-pay | Admitting: Internal Medicine

## 2023-04-10 ENCOUNTER — Other Ambulatory Visit (HOSPITAL_COMMUNITY): Payer: Self-pay | Admitting: Internal Medicine

## 2023-04-14 DIAGNOSIS — R051 Acute cough: Secondary | ICD-10-CM | POA: Diagnosis not present

## 2023-04-14 DIAGNOSIS — I5022 Chronic systolic (congestive) heart failure: Secondary | ICD-10-CM | POA: Diagnosis not present

## 2023-04-14 DIAGNOSIS — J189 Pneumonia, unspecified organism: Secondary | ICD-10-CM | POA: Diagnosis not present

## 2023-04-14 NOTE — Progress Notes (Signed)
Remote ICD transmission.   

## 2023-04-30 ENCOUNTER — Other Ambulatory Visit (HOSPITAL_COMMUNITY): Payer: Self-pay | Admitting: Registered Nurse

## 2023-04-30 DIAGNOSIS — R0602 Shortness of breath: Secondary | ICD-10-CM | POA: Diagnosis not present

## 2023-04-30 DIAGNOSIS — I483 Typical atrial flutter: Secondary | ICD-10-CM | POA: Diagnosis not present

## 2023-04-30 DIAGNOSIS — R9389 Abnormal findings on diagnostic imaging of other specified body structures: Secondary | ICD-10-CM

## 2023-04-30 DIAGNOSIS — R051 Acute cough: Secondary | ICD-10-CM | POA: Diagnosis not present

## 2023-04-30 DIAGNOSIS — J189 Pneumonia, unspecified organism: Secondary | ICD-10-CM | POA: Diagnosis not present

## 2023-05-01 ENCOUNTER — Ambulatory Visit (HOSPITAL_BASED_OUTPATIENT_CLINIC_OR_DEPARTMENT_OTHER)
Admission: RE | Admit: 2023-05-01 | Discharge: 2023-05-01 | Disposition: A | Discharge status: Home / Self Care | Payer: PPO | Source: Ambulatory Visit | Attending: Registered Nurse | Admitting: Registered Nurse

## 2023-05-01 DIAGNOSIS — J189 Pneumonia, unspecified organism: Secondary | ICD-10-CM | POA: Insufficient documentation

## 2023-05-01 DIAGNOSIS — R0602 Shortness of breath: Secondary | ICD-10-CM | POA: Diagnosis not present

## 2023-05-01 DIAGNOSIS — J432 Centrilobular emphysema: Secondary | ICD-10-CM | POA: Diagnosis not present

## 2023-05-01 DIAGNOSIS — J479 Bronchiectasis, uncomplicated: Secondary | ICD-10-CM | POA: Diagnosis not present

## 2023-05-01 DIAGNOSIS — R9389 Abnormal findings on diagnostic imaging of other specified body structures: Secondary | ICD-10-CM | POA: Diagnosis not present

## 2023-05-07 ENCOUNTER — Encounter: Payer: Self-pay | Admitting: Cardiology

## 2023-05-08 ENCOUNTER — Ambulatory Visit (INDEPENDENT_AMBULATORY_CARE_PROVIDER_SITE_OTHER): Payer: PPO | Admitting: Internal Medicine

## 2023-05-08 ENCOUNTER — Encounter: Payer: Self-pay | Admitting: Internal Medicine

## 2023-05-08 VITALS — BP 112/58 | HR 62 | Temp 97.7°F | Ht 75.0 in | Wt 214.0 lb

## 2023-05-08 DIAGNOSIS — R0609 Other forms of dyspnea: Secondary | ICD-10-CM

## 2023-05-08 LAB — CBC WITH DIFFERENTIAL/PLATELET
Basophils Absolute: 0 10*3/uL (ref 0.0–0.1)
Basophils Relative: 0.5 % (ref 0.0–3.0)
Eosinophils Absolute: 0.1 10*3/uL (ref 0.0–0.7)
Eosinophils Relative: 1.1 % (ref 0.0–5.0)
HCT: 45.2 % (ref 39.0–52.0)
Hemoglobin: 14.9 g/dL (ref 13.0–17.0)
Lymphocytes Relative: 20.6 % (ref 12.0–46.0)
Lymphs Abs: 1.8 10*3/uL (ref 0.7–4.0)
MCHC: 33 g/dL (ref 30.0–36.0)
MCV: 94.3 fl (ref 78.0–100.0)
Monocytes Absolute: 1 10*3/uL (ref 0.1–1.0)
Monocytes Relative: 11.1 % (ref 3.0–12.0)
Neutro Abs: 5.9 10*3/uL (ref 1.4–7.7)
Neutrophils Relative %: 66.7 % (ref 43.0–77.0)
Platelets: 275 10*3/uL (ref 150.0–400.0)
RBC: 4.79 Mil/uL (ref 4.22–5.81)
RDW: 14.1 % (ref 11.5–15.5)
WBC: 8.9 10*3/uL (ref 4.0–10.5)

## 2023-05-08 LAB — BRAIN NATRIURETIC PEPTIDE: Pro B Natriuretic peptide (BNP): 433 pg/mL — ABNORMAL HIGH (ref 0.0–100.0)

## 2023-05-08 LAB — SEDIMENTATION RATE: Sed Rate: 5 mm/hr (ref 0–20)

## 2023-05-08 NOTE — Patient Instructions (Addendum)
Make sure you check your oxygen saturation at your highest level of activity (NOT after you stop)  to be sure it stays over 90% and keep track of it at least once a week, more often if breathing getting worse, and let me know if losing ground. (Collect the dots to connect the dots approach)     Please remember to go to the lab department   for your tests - we will call you with the results when they are available.      Follow up as needed

## 2023-05-08 NOTE — Progress Notes (Unsigned)
Subjective:   Patient ID: Kyle Bullock, male    DOB: March 02, 1951    MRN: [id number]  Synopsis: Referred in 2016 for evaluation of COPD. Has ischemic cardiomyopathy after a large MI. LVEF 25-30% as per echocardiogram in March 2016. December 2015 CT angiogram chest: No pulmonary embolism, mild centrilobular and paraseptal emphysema and upper lobe distribution bilaterally, small pleural effusion on the left, there is dependent groundglass and mild interlobular septal thickening bilaterally Spirometry as part of June 2016 cardiopulmonary exercise test: Ratio 73%, FEV1 2.4 L (61% predicted), FVC 3.27 L (66% predicted). 06/2015 CT chest HRCT> very mild patches of ground glass in the bases of both lungs with non-specific interstitial changes, no clear fibrotic change; scattered pulmonary nodules appear stable compared to 11/2014, emphysema and bronchial wall thickening noted July 2016 pulmonary function testing: Ratio 73% predicted, FEV1 2.42 L (59% predicted) FVC 3.33 L (61% predicted), total lung capacity 5.74 L (71% predicted), DLCO 15.23 (38% predicted), Hgb 13.5  = COPD "GOLD 0"  [place] imp/plan  06/25/15  Centrilobular emphysema  CT scan from July 2016. This showed mild centrilobular emphysema bilaterally. There was a question of some very mild nonspecific interstitial changes in the periphery of the right lung and some scant dependent changes in the left lung.  >>>follow  with at least one repeat CT scan in one year with a repeat lung function test.  Multiple pulmonary nodules He had a few scattered small pulmonary nodules. These have been stable on 2 separate images. The radiologist has recommended a repeat CT chest in one year.    Toward end of April 2024 nasal congestion/ muscle aches/sorethroat bad cough clear mucus rx  doxy/omnicef but really didn't turn the corner until prednisone rx    05/08/2023  New Pt eval/ Pulmonary ov /Kyle Bullock re: COPD 0/ emphysema on and off amiodarone since 05/2020   maint on Trelegy since Feb 2024 from anoro while also back  on amiodarone daily since 08/04/22  Chief Complaint  Patient presents with   Consult    SOB with exertion x 6 weeks.  Review CT scan 05/01/2023. Concern with taking amiodarone.   Dyspnea:  was walking 2.5 miles per day and stopped and now back to mile and a half and finished prednisone on 05/07/23 p 6 day Cough: loose clear  Sleeping: bed is flat/ one pillow  SABA use: none  02: none    No obvious day to day or daytime variability or assoc excess/ purulent sputum or mucus plugs or hemoptysis or cp or chest tightness, subjective wheeze or overt sinus or hb symptoms.   Sleeping  without nocturnal  or early am exacerbation  of respiratory  c/o's or need for noct saba. Also denies any obvious fluctuation of symptoms with weather or environmental changes or other aggravating or alleviating factors except as outlined above   No unusual exposure hx or h/o childhood pna/ asthma or knowledge of premature birth.  Current Allergies, Complete Past Medical History, Past Surgical History, Family History, and Social History were reviewed in [employer] record.  ROS  The following are not active complaints unless bolded Hoarseness, sore throat, dysphagia, dental problems, itching, sneezing,  nasal congestion or discharge of excess mucus or purulent secretions, ear ache,   fever, chills, sweats, unintended wt loss or wt gain, classically pleuritic or exertional cp,  orthopnea pnd or arm/hand swelling  or leg swelling, presyncope, palpitations, abdominal pain, anorexia, nausea, vomiting, diarrhea  or change in bowel habits  or change in bladder habits, change in stools or change in urine, dysuria, hematuria,  rash, arthralgias, visual complaints, headache, numbness, weakness or ataxia or problems with walking or coordination,  change in mood or  memory.        Current Meds  Medication Sig   acetaminophen (TYLENOL) 500 MG tablet  Take 500 mg by mouth every 8 (eight) hours as needed for moderate pain.   amiodarone (PACERONE) 100 MG tablet Take 1 tablet (100 mg total) by mouth daily.   apixaban (ELIQUIS) 5 MG TABS tablet Take 1 tablet (5 mg total) by mouth 2 (two) times daily.   atorvastatin (LIPITOR) 20 MG tablet TAKE 1 TABLET BY MOUTH DAILY   cyanocobalamin (VITAMIN B12) 1000 MCG tablet Take 1,000 mcg by mouth daily.   furosemide (LASIX) 20 MG tablet TAKE 1 TABLET BY MOUTH DAILY AS NEEDED   levothyroxine (SYNTHROID) 112 MCG tablet Take 112 mcg by mouth daily before breakfast.   metoprolol succinate (TOPROL-XL) 25 MG 24 hr tablet Take 0.5 tablets (12.5 mg total) by mouth daily.   Na Sulfate-K Sulfate-Mg Sulf 17.5-3.13-1.6 GM/177ML SOLN Follow doctor instructions for split prep dosing   nitroGLYCERIN (NITROSTAT) 0.4 MG SL tablet PLACE 1 TABLET BY MOUTH UNDER TONGUE EVERY 5 MINUTES AS NEEDED CHEST PAIN   omega-3 acid ethyl esters (LOVAZA) 1 g capsule Take 2 g by mouth 2 (two) times daily.   Probiotic Product (PROBIOTIC PO) Take by mouth.   sacubitril-valsartan (ENTRESTO) 24-26 MG Take 1 tablet by mouth 2 (two) times daily.   spironolactone (ALDACTONE) 25 MG tablet Take 0.5 tablets (12.5 mg total) by mouth daily.   TRELEGY ELLIPTA 100-62.5-25 MCG/ACT AEPB Take 1 puff by mouth daily.         Past Medical History  Diagnosis Date   Hypertension    Ischemic cardiomyopathy     a. 11/2014: EF reportedly 20% by cath and echo in [place].   VF (ventricular fibrillation) 11/10/14    a. 11/2014: arrest with STEMI.   NSVT (nonsustained ventricular tachycardia) 11/16/14    a. 11/2014: admitted after discharge from STEMI admission, 18 beats 135-140bpm in ED.   CAD (coronary artery disease)     a. 11/2014: arrest/STEMI s/p Xience DES to the LAD and PTCA to the D1 [place], [place]). b. Relook cath 11/2014: no culprit, patent stent.   Dyspnea     a. 11/2014: possibly due to combo of CHF and Brilinta. Brilinta changed to Effient.    Tobacco abuse    RBBB    Dyslipidemia    Abnormal TSH     a. 11/2014: felt d/t sick euthyroid.   Pericarditis dx'd 11/2014   Dressler's syndrome     a. 11/2014: tx with colchicine and prednisone.   Pericardial effusion     a. 11/2014.   Erectile dysfunction     a. Pt aware not to take ED med within 24 hr of NTG and vice versa.   AICD (automatic cardioverter/defibrillator) present 02/28/2015   CHF (congestive heart failure)    STEMI (ST elevation myocardial infarction) 11/10/2014   Pneumonia 1990's X 2   Sleep apnea     suspected but not diagnosed (02/28/2015)     PEx:  Wt Readings from Last 3 Encounters:  05/08/23 214 lb (97.1 kg)  03/16/23 212 lb 1.3 oz (96.2 kg)  01/07/23 212 lb (96.2 kg)      Vital signs reviewed  05/08/2023  - Note at rest 02 sats  97% on RA  General appearance:    robust amb wm nad    HEENT : Oropharynx  clear         NECK :  without  apparent JVD/ palpable Nodes/TM    LUNGS: no acc muscle use,  Nl contour chest which is clear to A and P bilaterally without cough on insp or exp maneuvers   CV:  RRR  no s3 or murmur or increase in P2, and no edema   ABD:  soft and nontender with nl inspiratory excursion in the supine position. No bruits or organomegaly appreciated   MS:  Nl gait/ ext warm without deformities Or obvious joint restrictions  calf tenderness, cyanosis or clubbing    SKIN: warm and dry without lesions    NEURO:  alert, approp, nl sensorium with  no motor or cerebellar deficits apparent.       I personally reviewed images and agree with radiology impression as follows:   Chest HRCT  05/01/23 1. The appearance of the lungs is compatible with interstitial lung disease, with a spectrum of findings considered most compatible with an alternative diagnosis (not usual interstitial pneumonia) per current ATS guidelines. There is a suggestion of some developing upper lung predominant honeycombing, but this is not yet definitive. Primary  differential considerations favor potential chronic hypersensitivity pneumonitis.   2. Mild diffuse bronchial wall thickening with mild to moderate centrilobular and paraseptal emphysema; imaging findings suggestive of underlying COPD. 3. Small pulmonary nodules are stable likely benign. Attention at time of routine annual lung cancer screening chest CT is recommended.         Assessment & Plan:

## 2023-05-10 NOTE — Assessment & Plan Note (Addendum)
Quit smoking Dec 2015 with GOLD 0 critieria as of 06/2015  -  off and on amiodarone since 05/2020 and back on it daily since ablation 07/30/22 -  onset worse doe feb 2024 and change from anoro to trelegy  -  Chest HRCT  05/01/23 1. The appearance of the lungs is compatible with interstitial lung disease, with a spectrum of findings considered most compatible with an alternative diagnosis (not usual interstitial pneumonia) per current ATS guidelines. There is a suggestion of some developing upper lung predominant honeycombing, but this is not yet definitive. Primary differential considerations favor potential chronic hypersensitivity pneumonitis.   2. Mild diffuse bronchial wall thickening with mild to moderate centrilobular and paraseptal emphysema; imaging findings suggestive of underlying COPD.  - Pred rx x 6 d completed May 07 2023 > ESR 5 on 05/08/2023  - 05/08/2023   Walked on RA  x  3  lap(s) =  approx 750  ft  @ brisk  pace, stopped due to end of study s sob with lowest 02 sats 91%    He is much better since short course of prednisone with nl ESR but I strongly suspect amio toxicity here and if sats trending back down off the prednisone then strongly consider trial off amiodarone and slower pred taper eg 20 mg per day until better then 10 mg x 2 weeks the 5 mg x 2 weeks and off all the while advised.  Make sure you check your oxygen saturation at your highest level of activity(NOT after you stop)  to be sure it stays over 90% and keep track of it at least once a week, more often if breathing getting worse, and let me know if losing ground. (Collect the dots to connect the dots approach)    Patients typically have been on amiodarone for 6-12 months before this complication manifests.  Of note, serial clinical evaluation for symptoms such as cough dyspnea or fevers is  the preferred method of monitoring for pulmonary toxicity because a decrease in DLCO or lung volumes is a nonspecific for  toxicity. Pathologically amiodarone pulmonary toxicity may appear as interstitial pneumonitis, eosinophilic pneumonia, organizing pneumonia, pulmonary fibrosis or less commonly as diffuse alveolar hemorrhage, pulmonary nodules or pleural effusions.  Risk factors for pulmonary toxicity include age greater than 60, daily dose greater than equal to 400 mg, a high cumulative dose, or pre-existing lung disease (emphysema with copd gold 0)   so he has 3/4 risk factors and dx is made purely on a clinical basis (lung bx won't really help)  Each maintenance medication was reviewed in detail including emphasizing most importantly the difference between maintenance and prns and under what circumstances the prns are to be triggered using an action plan format where appropriate.  Total time for H and P, chart review, counseling,  directly observing portions of ambulatory 02 saturation study/ and generating customized AVS unique to this office visit / same day charting  > 45 min new pt eval .

## 2023-05-11 LAB — IGE: IgE (Immunoglobulin E), Serum: 132 kU/L — ABNORMAL HIGH (ref <=114)

## 2023-05-11 NOTE — Telephone Encounter (Signed)
Left message for patient to call back  

## 2023-05-11 NOTE — Telephone Encounter (Signed)
Kyle Prude, MD  Frutoso Schatz, RN [name], please have the patient discontinue his amiodarone and schedule him with me at my next available to discuss further.  Thanks,  Steffanie Dunn

## 2023-05-12 ENCOUNTER — Telehealth: Payer: Self-pay | Admitting: Cardiology

## 2023-05-12 NOTE — Telephone Encounter (Signed)
Patient returned RN's call.  Visit scheduled on 10/4.

## 2023-05-17 NOTE — Progress Notes (Unsigned)
Electrophysiology Office Follow up Visit Note:    Date:  05/17/2023   ID:  Kyle Bullock, DOB 31-Jan-1951, MRN [id number]  PCP:  Cleatis Polka., MD  [facility] Cardiologist:  None  [facility] Electrophysiologist:  Lanier Prude, MD    Interval History:    Kyle Bullock is a 72 y.o. male who presents for a follow up visit.   I last saw the patient December 31, 2022.  He has a history of dual tachycardia with atrial fibrillation and ventricular tachycardia/ventricular fibrillation.  He had an A-fib ablation August 04, 2022.  The patient was previously seen by Dr. Graciela Husbands.  He has an ICD that was implanted in 2016 given his history of ventricular tachycardia.  His ablation for atrial fibrillation was August 04, 2022.  During that procedure, the pulmonary veins and posterior wall were isolated.  He was started on amiodarone in June 2015 after cardiac arrest.  He has been on and off the medication during the past 9 years.  He was restarted on daily amiodarone by Dr. Graciela Husbands after he received shocks from his defibrillator.      Past medical, surgical, social and family history were reviewed.  ROS:   Please see the history of present illness.    All other systems reviewed and are negative.  EKGs/Labs/Other Studies Reviewed:    The following studies were reviewed today:  May 01, 2023 high resolution chest CT Upper lobe predominant honeycombing Chronic hypersensitivity pneumonitis Mild to moderate centrilobular and paraseptal emphysema, suggestive of underlying COPD  September 02, 2022 CT chest Centrilobular and paraseptal emphysema.  Superimposed subpleural reticular densities and groundglass  July 18, 2021 CT chest Paraseptal and centrilobular emphysema  February 06, 2020 CT chest biapical pleural-parenchymal scarring, mild centrilobular and paraseptal emphysema, upper lung predominant, mild subpleural reticulation/fibrosis bilateral lungs  February 02, 2019 CT  chest Moderate upper lobe predominant paraseptal emphysema, subpleural calcifications on the right posterolateral pleura  January 18, 2018 CT chest Centrilobular emphysema  January 21, 2017 CT chest No significant regions of groundglass, subpleural reticulation, traction bronchiectasis or frank honeycombing.  Mild air trapping.  Diffuse bronchial wall thickening with mild to moderate centrilobular and paraseptal emphysema most pronounced in the mid to upper lungs.  June 14, 2015 CT chest Mild diffuse bronchial wall thickening with mild centrilobular and paraseptal emphysema, mild air trapping  January 25, 2014 CT PE Patchy honeycombing in both upper lobes, left greater than right.  EKG:  The ekg ordered today demonstrates ***   Physical Exam:    VS:  There were no vitals taken for this visit.    Wt Readings from Last 3 Encounters:  05/08/23 214 lb (97.1 kg)  03/16/23 212 lb 1.3 oz (96.2 kg)  01/07/23 212 lb (96.2 kg)     GEN: *** Well nourished, well developed in no acute distress CARDIAC: ***RRR, no murmurs, rubs, gallops RESPIRATORY:  Clear to auscultation without rales, wheezing or rhonchi       ASSESSMENT:    1. Persistent atrial fibrillation (HCC)   2. VF arrest- shocked x 1 11/10/14   3. ICD (implantable cardioverter-defibrillator) in place   4. Chronic systolic CHF (congestive heart failure) (HCC)    PLAN:    In order of problems listed above:   #Persistent atrial fibrillation Doing well after his catheter ablation without sustained recurrence.  Has now been off amiodarone due to concerns that is contributing to his shortness of breath/possible Amio induced lung toxicity.  I  have reviewed his imaging in detail.  He has had abnormal lung imaging for at least 9 years with reports of emphysematous changes throughout that entire time.  Early spirometry from 2016 also was markedly abnormal.  It is not entirely clear to me that the amiodarone is the culprit for his  worsening respiratory status but I think it is reasonable at this time to hold it given he is maintaining normal rhythm.  Obviously of concern is his history of ventricular fibrillation so we will need to monitor his rhythm closely using his defibrillator.  We discussed the balance of risks between continuing on amiodarone and holding it during today's visit.  #History of ventricular fibrillation #ICD in situ Device functioning appropriately.  Continue remote monitoring.  #Chronic systolic heart failure #Ischemic cardiomyopathy No ischemic symptoms today.  Warm and dry on exam.  NYHA class II.  Continue GDMT.  Follow-up 1 year with APP.      Signed, Steffanie Dunn, MD, [facility], [facility] 05/17/2023 4:34 PM    Electrophysiology [facility]

## 2023-05-18 ENCOUNTER — Ambulatory Visit: Payer: PPO | Attending: Cardiology | Admitting: Cardiology

## 2023-05-18 ENCOUNTER — Encounter: Payer: Self-pay | Admitting: Cardiology

## 2023-05-18 VITALS — BP 92/50 | HR 57 | Ht 75.0 in | Wt 214.0 lb

## 2023-05-18 DIAGNOSIS — Z95810 Presence of automatic (implantable) cardiac defibrillator: Secondary | ICD-10-CM

## 2023-05-18 DIAGNOSIS — I5022 Chronic systolic (congestive) heart failure: Secondary | ICD-10-CM | POA: Diagnosis not present

## 2023-05-18 DIAGNOSIS — I4819 Other persistent atrial fibrillation: Secondary | ICD-10-CM | POA: Diagnosis not present

## 2023-05-18 DIAGNOSIS — I4901 Ventricular fibrillation: Secondary | ICD-10-CM | POA: Diagnosis not present

## 2023-05-18 NOTE — Patient Instructions (Signed)
Medication Instructions:  Your physician recommends that you continue on your current medications as directed. Please refer to the Current Medication list given to you today.  *If you need a refill on your cardiac medications before your next appointment, please call your pharmacy*  Follow-Up: At  HeartCare, you and your health needs are our priority.  As part of our continuing mission to provide you with exceptional heart care, we have created designated Provider Care Teams.  These Care Teams include your primary Cardiologist (physician) and Advanced Practice Providers (APPs -  Physician Assistants and Nurse Practitioners) who all work together to provide you with the care you need, when you need it.  Your next appointment:   6 month(s)  Provider:   You will see one of the following Advanced Practice Providers on your designated Care Team:   Renee Ursuy, PA-C Michael "Andy" Tillery, PA-C Suzann Riddle, NP   

## 2023-05-18 NOTE — Telephone Encounter (Signed)
Patient seen in clinic today, 6/10, by Dr. Lalla Brothers.

## 2023-05-22 ENCOUNTER — Other Ambulatory Visit (HOSPITAL_COMMUNITY): Payer: Self-pay | Admitting: Cardiology

## 2023-05-25 DIAGNOSIS — Z1152 Encounter for screening for COVID-19: Secondary | ICD-10-CM | POA: Diagnosis not present

## 2023-05-25 DIAGNOSIS — R0981 Nasal congestion: Secondary | ICD-10-CM | POA: Diagnosis not present

## 2023-05-25 DIAGNOSIS — I483 Typical atrial flutter: Secondary | ICD-10-CM | POA: Diagnosis not present

## 2023-05-25 DIAGNOSIS — R058 Other specified cough: Secondary | ICD-10-CM | POA: Diagnosis not present

## 2023-05-25 DIAGNOSIS — R9389 Abnormal findings on diagnostic imaging of other specified body structures: Secondary | ICD-10-CM | POA: Diagnosis not present

## 2023-05-25 DIAGNOSIS — I5022 Chronic systolic (congestive) heart failure: Secondary | ICD-10-CM | POA: Diagnosis not present

## 2023-05-25 DIAGNOSIS — R509 Fever, unspecified: Secondary | ICD-10-CM | POA: Diagnosis not present

## 2023-05-25 DIAGNOSIS — J432 Centrilobular emphysema: Secondary | ICD-10-CM | POA: Diagnosis not present

## 2023-05-27 ENCOUNTER — Other Ambulatory Visit (HOSPITAL_COMMUNITY): Payer: Self-pay | Admitting: Internal Medicine

## 2023-05-29 ENCOUNTER — Institutional Professional Consult (permissible substitution) (HOSPITAL_BASED_OUTPATIENT_CLINIC_OR_DEPARTMENT_OTHER): Payer: PPO | Admitting: Pulmonary Disease

## 2023-06-01 ENCOUNTER — Ambulatory Visit (HOSPITAL_COMMUNITY)
Admission: RE | Admit: 2023-06-01 | Discharge: 2023-06-01 | Disposition: A | Discharge status: Home / Self Care | Payer: PPO | Source: Ambulatory Visit | Attending: Family Medicine | Admitting: Family Medicine

## 2023-06-01 ENCOUNTER — Encounter (HOSPITAL_COMMUNITY): Payer: Self-pay

## 2023-06-01 VITALS — BP 110/72 | HR 63 | Wt 215.4 lb

## 2023-06-01 DIAGNOSIS — Z7901 Long term (current) use of anticoagulants: Secondary | ICD-10-CM | POA: Diagnosis not present

## 2023-06-01 DIAGNOSIS — I4892 Unspecified atrial flutter: Secondary | ICD-10-CM | POA: Insufficient documentation

## 2023-06-01 DIAGNOSIS — I10 Essential (primary) hypertension: Secondary | ICD-10-CM | POA: Diagnosis not present

## 2023-06-01 DIAGNOSIS — I4891 Unspecified atrial fibrillation: Secondary | ICD-10-CM | POA: Diagnosis not present

## 2023-06-01 DIAGNOSIS — I472 Ventricular tachycardia, unspecified: Secondary | ICD-10-CM | POA: Diagnosis not present

## 2023-06-01 DIAGNOSIS — T8182XS Emphysema (subcutaneous) resulting from a procedure, sequela: Secondary | ICD-10-CM | POA: Diagnosis not present

## 2023-06-01 DIAGNOSIS — Z79899 Other long term (current) drug therapy: Secondary | ICD-10-CM | POA: Insufficient documentation

## 2023-06-01 DIAGNOSIS — I2510 Atherosclerotic heart disease of native coronary artery without angina pectoris: Secondary | ICD-10-CM | POA: Diagnosis not present

## 2023-06-01 DIAGNOSIS — Z8249 Family history of ischemic heart disease and other diseases of the circulatory system: Secondary | ICD-10-CM | POA: Insufficient documentation

## 2023-06-01 DIAGNOSIS — Z87891 Personal history of nicotine dependence: Secondary | ICD-10-CM | POA: Diagnosis not present

## 2023-06-01 DIAGNOSIS — J439 Emphysema, unspecified: Secondary | ICD-10-CM | POA: Insufficient documentation

## 2023-06-01 DIAGNOSIS — I5022 Chronic systolic (congestive) heart failure: Secondary | ICD-10-CM | POA: Diagnosis not present

## 2023-06-01 DIAGNOSIS — Z95810 Presence of automatic (implantable) cardiac defibrillator: Secondary | ICD-10-CM | POA: Insufficient documentation

## 2023-06-01 DIAGNOSIS — R0683 Snoring: Secondary | ICD-10-CM

## 2023-06-01 DIAGNOSIS — I480 Paroxysmal atrial fibrillation: Secondary | ICD-10-CM

## 2023-06-01 DIAGNOSIS — I255 Ischemic cardiomyopathy: Secondary | ICD-10-CM

## 2023-06-01 DIAGNOSIS — I252 Old myocardial infarction: Secondary | ICD-10-CM | POA: Insufficient documentation

## 2023-06-01 DIAGNOSIS — I110 Hypertensive heart disease with heart failure: Secondary | ICD-10-CM | POA: Insufficient documentation

## 2023-06-01 LAB — BASIC METABOLIC PANEL
Anion gap: 8 (ref 5–15)
BUN: 14 mg/dL (ref 8–23)
CO2: 26 mmol/L (ref 22–32)
Calcium: 9.3 mg/dL (ref 8.9–10.3)
Chloride: 106 mmol/L (ref 98–111)
Creatinine, Ser: 1.21 mg/dL (ref 0.61–1.24)
GFR, Estimated: >60 mL/min (ref >60)
Glucose, Bld: 112 mg/dL — ABNORMAL HIGH (ref 70–99)
Potassium: 4.5 mmol/L (ref 3.5–5.1)
Sodium: 140 mmol/L (ref 135–145)

## 2023-06-01 LAB — BRAIN NATRIURETIC PEPTIDE: B Natriuretic Peptide: 851.1 pg/mL — ABNORMAL HIGH (ref 0.0–100.0)

## 2023-06-01 MED ORDER — SPIRONOLACTONE 25 MG PO TABS
25.0000 mg | ORAL_TABLET | Freq: Every day | ORAL | 3 refills | Status: AC
Start: 2023-06-01 — End: ?

## 2023-06-01 NOTE — Patient Instructions (Signed)
INCREASE Spironolactone to 25 mg daily.  Take Lasix Monday, Wednesday and Friday. Then go  back to as needed.  Labs done today, your results will be available in MyChart, we will contact you for abnormal readings.  Repeat blood work in 1 week.  Expect to be in the lab for 2 hours. Please plan to arrive 30 minutes prior to your appointment. You may be asked to reschedule your test if you arrive 20 minutes or more after your scheduled appointment time.  Main Campus address: [address] [place], [place] [address] You may arrive to the Main Entrance A or Entrance C (free valet parking is available at both). -Main Entrance A (on [address]) :proceed to admitting for check in -Entrance C (on [facility]): proceed to [employer] parking or under hospital deck parking using this code _________  Check In: [facility] waiting room (1st floor)   General Instructions for the day of the test (Please follow all instructions from your physician): Refrain from ingesting a heavy meal, alcohol, or caffeine or using tobacco products within 2 hours of the test (DO NOT FAST for mare than 8 hours). You may have all other non-alcoholic, non -caffeinated beverage,a light snack (crackers,a piece of fruit, carrot sticks, toast bagel,etc) up to your appointment. Avoid significant exertion or exercise within 24 hours of your test. Be prepared to exercise and sweat. Your clothing should permit freedom of movement and include walking or running shoes. Women bring loose fitting short sleeved blouse.  This evaluation may be fatiguing and you may wish ti have someone accompany you to the assessment to drive you home afterward. Bring a list of your medications with you, including dosage and frequency you take the medications (  I.e.,once per day, twice per day, etc). Take all medications as prescribed, unless noted below or instructed to do so by your physician.  Please do not take the following medications prior  to your CPX:  _________________________________________________  _________________________________________________  Brief description of the test: A brief lung test will be performed. This will involve you taking deep breaths and blowing hard and fast through your mouth. During these , a clip will be on your nose and you will be breathing through a breathing device.   For the exercise portion of the test you will be walking on a treadmill, or riding a stationary bike, to your maximal effor or until symptoms such as chest pain, shortness of breath, leg pain or dizziness limit your exercise. You will be breathing in and out of a breathing device through your mouth (a clip will be on your nose again). Your heart rate, ECG, blood pressure, oxygen saturations, breathing rate and depth, amount of oxygen you consume and amount of carbon dioxide you produce will be measured and monitored throughout the exercise test.  If you need to cancel or reschedule your appointment please call [phone] If you have further questions please call your physician or Philip Aspen, MS, ACSM-RCEP at [phone]  Your physician recommends that you schedule a follow-up appointment in: 4 months with an echocardiogram ( October) ** please call the office in August to arrange your follow up appointment. **  If you have any questions or concerns before your next appointment please send [place] a message through [place] or call our office at [phone].    TO LEAVE A MESSAGE FOR THE NURSE SELECT OPTION 2, PLEASE LEAVE A MESSAGE INCLUDING: YOUR NAME DATE OF BIRTH CALL BACK NUMBER REASON FOR CALL**this is  important as we prioritize the call backs  YOU WILL RECEIVE A CALL BACK THE SAME DAY AS LONG AS YOU CALL BEFORE 4:00 PM  At the [facility], you and your health needs are our priority. As part of our continuing mission to provide you with exceptional heart care, we have created designated Provider Care  Teams. These Care Teams include your primary Cardiologist (physician) and Advanced Practice Providers (APPs- Physician Assistants and Nurse Practitioners) who all work together to provide you with the care you need, when you need it.   You may see any of the following providers on your designated Care Team at your next follow up: Dr Arvilla Meres Dr Marca Ancona Dr. Marcos Eke, NP Robbie Lis, [place] [facility] [place], [place] Brynda Peon, NP Karle Plumber, PharmD   Please be sure to bring in all your medications bottles to every appointment.    Thank you for choosing [facility]

## 2023-06-01 NOTE — Progress Notes (Signed)
ReDS Vest / Clip - 06/01/23 1200       ReDS Vest / Clip   Station Marker C    Ruler Value 27    ReDS Value Range High volume overload    ReDS Actual Value 41

## 2023-06-01 NOTE — Progress Notes (Addendum)
Patient ID: Kyle Bullock, male   DOB: 04/29/1951, 72 y.o.   MRN: [id number]    Advanced Heart Failure Clinic Note   PCP: Cleatis Polka., MD EP: Dr. Lalla Brothers HF Cardiologist: Dr. Gala Romney  HPI: Kyle Bullock is a 72 y.o. male who is referred to the HF Clinic by Dr. Excell Seltzer. He has h/o CAD and ischemic cardiomyopathy 20-25%.  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    In December 2015, he suffered an acute anterolateral MI complicated by VF arrest in [place] [facility] . He underwent drug-eluting stent placement to the LAD and PTCA of the diagonal. EF was 25% and a life vest was placed prior to discharge. Following return to [facility], he developed dyspnea and presented to [facility] where he was readmitted. His Brilinta was switched to Effient as it was felt the Brilinta may have been playing a role in his dyspnea. He was also switched from an ACE inhibitor to an ARB. He was seen by electrophysiology with recommendation for ongoing life vest therapy and follow-up echo in 90 days post revascularization. He was then seen in clinic in mid December where he reported pleuritic chest pain. He was readmitted and underwent diagnostic catheterization revealing patency of previously placed stent and area of balloon angioplasty in the diagonal. EF was 20-25% with diffuse hypokinesis and normal pericardial appearance. He did have some hypotension requiring discontinuation of ARB therapy. His beta blocker was also down titrated. He was diagnosed with Dressler's syndrome and was placed on prednisone taper and colchicine.  CT with COPD. Referred to Dr. Kendrick Fries who felt he had mild COPD but no ILD. Given PRN albuterol. Has repeat CT scheduled for 1 year.   Had ATP in 7/22. Found on device interrogation. Seen by EP. According to EP device showed initially irregular arrhythmia concerning for Afib. He then developed a regular tachy arrhythmia (likely VT) which was terminated with ATP. After ATP, his  rhythm was again regular. Zio placed showed SVT with some ectopy. No VT or AF.   Cath in 8/22 for CP with stable CAD.   Had ICD shock 12/05/21 while he was stacking wood and carrying large loads, when he received an ICD shock. Seen by EP. Felt to have AF/AFL possibly leading to VT. Zio placed showing 5% AF/AFL. Started on amio. Eliquis resumed.   Echo 1/23 EF 25-30%   Had AF ablation 08/04/22, now off amiodarone as of 04/2023.  Follow up 9/23, stable NYHA II and volume stable. Discussed repeating CPX down the road for risk stratification.  Today he returns for HF follow up with his wife. Overall feeling fine. On prednisone for the past week for COPD. Previously very active, walking 2.5 miles/day, now unable to walk at all w/o SOB.  Decline over the past week or so. He has SOB walking up steps, does OK with ADLs. Denies palpitations, abnormal bleeding, CP, dizziness, edema, or PND/Orthopnea. Appetite ok. No fever or chills. Weight at home 212 pounds. Taking all medications. Now off amiodarone over concerns of ILD. Takes Lasix 1-2x/week, missed this week due to vacation.  Cardiac studies: - Echo (1/23): EF 25-30%  - R/LHC (8/22): CAD with widely patent LAD stent, non-obs CAD in RCA and LCX with 60% lesion throughout mid RCA Ao = 104/57 (77)  LV = 107/7 RA =  3 RV = 23/1 PA = 21/4 (10) PCW = 3 Fick cardiac output/index = 6.0/2.7 PVR = 1.2 WU Ao sat = 99% PA  sat = 74%, 74%   - Echo 9/18 EF 20-25% RV normal   - CPX (2/18) FVC 3.49 (72%)      FEV1 2.49 (68%)        FEV1/FVC 71 (93%)        MVV 117 (82%) Resting HR: 62 Peak HR: 148   (97% age predicted max HR) BP rest: 84/60 BP peak: 156/62 Peak VO2: 19.6 (69% predicted peak VO2) VE/VCO2 slope:  37 OUES: 2.14 Peak RER: 1.06 Ventilatory Threshold: 17.4 (61% predicted or measured peak VO2) VE/MVV:  66% O2pulse:  12   (71% predicted O2pulse)  - Echo 03/2016 EF 20-25%  - CPX (6/16)  Peak VO2: 19.0 (64.5% predicted peak  VO2) VE/VCO2 slope:  41.5 OUES: 2.01 Peak RER: 1.06 Ventilatory Threshold: 14.1(47.9% predicted or measured peak VO2)  - Echo 3/16 EF 25-30%  Past Medical History:  Diagnosis Date   Abnormal TSH    a. 11/2014: felt d/t sick euthyroid.   AICD (automatic cardioverter/defibrillator) present 02/28/2015   CAD (coronary artery disease)    a. 11/2014: arrest/STEMI s/p Xience DES to the LAD and PTCA to the D1 [facility], [place] [facility]). b. Relook cath 11/2014: no culprit, patent stent.   CHF (congestive heart failure) (HCC)    Dressler's syndrome (HCC)    a. 11/2014: tx with colchicine and prednisone.   Dyslipidemia    Dyspnea    a. 11/2014: possibly due to combo of CHF and Brilinta. Brilinta changed to Effient.   Erectile dysfunction    a. Pt aware not to take ED med within 24 hr of NTG and vice versa.   Hypertension    Ischemic cardiomyopathy    a. 11/2014: EF reportedly 20% by cath and echo in [place].   NSVT (nonsustained ventricular tachycardia) (HCC) 11/16/14   a. 11/2014: admitted after discharge from STEMI admission, 18 beats 135-140bpm in ED.   Pericardial effusion    a. 11/2014.   Pericarditis dx'd 11/2014   Pneumonia 1990's X 2   RBBB    Sleep apnea    suspected but not diagnosed (02/28/2015)   STEMI (ST elevation myocardial infarction) (HCC) 11/10/2014   Tobacco abuse    VF (ventricular fibrillation) (HCC) 11/10/14   a. 11/2014: arrest with STEMI.   Past Surgical History:  Procedure Laterality Date   A-FLUTTER ABLATION N/A 09/04/2020   Procedure: A-FLUTTER ABLATION;  Surgeon: Hillis Range, MD;  Location: [facility];  Service: Cardiovascular;  Laterality: N/A;   ATRIAL FIBRILLATION ABLATION N/A 08/04/2022   Procedure: ATRIAL FIBRILLATION ABLATION;  Surgeon: Lanier Prude, MD;  Location: [facility]VAS[facility] CV [facility];  Service: Cardiovascular;  Laterality: N/A;   CARDIAC CATHETERIZATION  11/19/2014   CARDIAC DEFIBRILLATOR PLACEMENT  02/28/2015   COLONOSCOPY WITH PROPOFOL N/A  03/16/2023   Procedure: COLONOSCOPY WITH PROPOFOL;  Surgeon: Imogene Burn, MD;  Location: [facility]OS[facility];  Service: Gastroenterology;  Laterality: N/A;   CORONARY ANGIOPLASTY WITH STENT PLACEMENT  11/10/2014   LAD DES   HAND SURGERY Bilateral    Dupuytren's Contracture   IMPLANTABLE CARDIOVERTER DEFIBRILLATOR IMPLANT N/A 02/28/2015   SJM Fortify Assura VR ICD implanted by Dr Johney Frame.  Revascularized following VF arrest, EF remained depressed and ICD implanted   KNEE ARTHROSCOPY Right 12/08/1980   LEFT HEART CATH N/A 11/19/2014   Procedure: LEFT HEART CATH;  Surgeon: Runell Gess, MD;  Location: [facility] CATH LAB;  Service: Cardiovascular;  Laterality: N/A;   POLYPECTOMY  03/16/2023   Procedure: POLYPECTOMY;  Surgeon: Imogene Burn, MD;  Location: [facility] ENDOSCOPY;  Service: Gastroenterology;;   RIGHT/LEFT HEART CATH AND CORONARY ANGIOGRAPHY N/A 07/22/2021   Procedure: RIGHT/LEFT HEART CATH AND CORONARY ANGIOGRAPHY;  Surgeon: Dolores Patty, MD;  Location: [facility] CV [facility];  Service: Cardiovascular;  Laterality: N/A;   Current Outpatient Medications  Medication Sig Dispense Refill   acetaminophen (TYLENOL) 500 MG tablet Take 500 mg by mouth every 8 (eight) hours as needed for moderate pain.     apixaban (ELIQUIS) 5 MG TABS tablet Take 1 tablet (5 mg total) by mouth 2 (two) times daily. 60 tablet 5   atorvastatin (LIPITOR) 20 MG tablet TAKE 1 TABLET BY MOUTH DAILY 90 tablet 3   cyanocobalamin (VITAMIN B12) 1000 MCG tablet Take 1,000 mcg by mouth daily.     furosemide (LASIX) 20 MG tablet TAKE 1 TABLET BY MOUTH DAILY AS NEEDED 30 tablet 2   levothyroxine (SYNTHROID) 112 MCG tablet Take 112 mcg by mouth daily before breakfast.     metoprolol succinate (TOPROL-XL) 25 MG 24 hr tablet Take 0.5 tablets (12.5 mg total) by mouth daily. 45 tablet 2   nitroGLYCERIN (NITROSTAT) 0.4 MG SL tablet PLACE 1 TABLET UNDER THE TONGUE EVERY 5 MINUTES AS NEEDED FOR CHEST PAIN 25 tablet 3   omega-3 acid ethyl esters  (LOVAZA) 1 g capsule Take 2 g by mouth 2 (two) times daily.     Probiotic Product (PROBIOTIC PO) Take 1 tablet by mouth every other day.     sacubitril-valsartan (ENTRESTO) 24-26 MG Take 1 tablet by mouth 2 (two) times daily. 180 tablet 3   spironolactone (ALDACTONE) 25 MG tablet Take 0.5 tablets (12.5 mg total) by mouth daily.     TRELEGY ELLIPTA 100-62.5-25 MCG/ACT AEPB Take 1 puff by mouth daily.     No current facility-administered medications for this encounter.    Allergies:   Ambien [zolpidem tartrate], Lisinopril, and Brilinta [ticagrelor]   Social History:  The patient  reports that he quit smoking about 8 years ago. His smoking use included cigarettes. He has a 80.00 pack-year smoking history. He has never used smokeless tobacco. He reports current alcohol use of about 14.0 standard drinks of alcohol per week. He reports that he does not use drugs.   Family History:  The patient's family history includes COPD in his mother; Crohn's disease in his brother; Heart disease in his mother; Hypertension in his father and mother; Prostate cancer in his father.   Review of systems complete and found to be negative unless listed in HPI.     Recent Labs: 12/31/2022: ALT 16; BUN 23; Creatinine, Ser 1.45; Potassium 4.6; Sodium 142; TSH 5.050 05/08/2023: Hemoglobin 14.9; Platelets 275.0; Pro B Natriuretic peptide (BNP) 433.0   Lipid Panel 05/2022 Total Chol 120 TGs 94 HDL 47 LDL 54  BP 110/72   Pulse 63   Wt 97.7 kg (215 lb 6.4 oz)   SpO2 97%   BMI 26.92 kg/m   Wt Readings from Last 3 Encounters:  06/01/23 97.7 kg (215 lb 6.4 oz)  05/18/23 97.1 kg (214 lb)  05/08/23 97.1 kg (214 lb)   Physical Exam General:  NAD. No resp difficulty, walked into clinic HEENT: Normal Neck: Supple. JVP 10. Carotids 2+ bilat; no bruits. No lymphadenopathy or thryomegaly appreciated. Cor: PMI nondisplaced. Regular rate & rhythm. No rubs, gallops or murmurs. Lungs: Clear Abdomen: Soft, nontender,  +distended. No hepatosplenomegaly. No bruits or masses. Good bowel sounds. Extremities: No cyanosis, clubbing, rash,1+ BLE  edema Neuro: Alert & oriented x 3,  cranial nerves grossly intact. Moves all 4 extremities w/o difficulty. Affect pleasant.  Device interrogation (personally reviewed): Thoracic impedence trending down suggesting volume overload, < 1% V pacing, no VT  ReDs: 41%  ASSESSMENT AND PLAN: 1. Chronic systolic due to ischemic CM  - Echo (4/17): EF 20-25%, Grade 2 DD - Echo (3/18): stable EF 20-25%. Normal RV.  - Echo (9/18): EF 20-25%, grade 2 DD  - CPX (2018) pVO2 19.6 (69%) slope 37 RER 1.06 FEV1 (68%)  - Echo (6/21): EF 25% RV normal. - Echo (1/23): EF 25-30%  - Worse NYHA II-early III, COPD contributes. Volume up by CorVue and ReDs 41%. Likely 2/2 to prednisone. - Increase spironolactone to 25 mg daily. - Take Lasix 20 mg MWF this week, then back to PRN - Continue Entresto 24/26. No BP room to increase - Continue Toprol XL 12.5 mg daily - Off Jardiance with yeast. - Repeat CPX for risk stratification. - Update echo. - BMET/BNP today, repeat BMET in 1 week.  2.  CAD  - s/p Anterior MI (11/2014) - Cath 8/22 with stable CAD - No s/s angina - No ASA with need for [facility] - Continue statin - Lipids per Dr. Clelia Croft.   3. AF/AFL - Zio 5% AF burden - Continue Eliquis 5 mg bid. No bleeding issues. Recent CBC reviewed and stable - Had AF ablation 08/04/22  - Regular on exam today. - Now off amiodarone over concerns for possible amio-induced lung toxicity - Follows with Dr. Lalla Brothers   4. H/o VT - had SVT/VT in 12/22 - As above, off amiodarone - No VT on device interrogation today.   5. Tobacco abuse  - Remains quit  6. Emphysema - Sees pulmonary.  - Spirometry on CPX previously reviewed with only mild disease.  - Chest HRCT 05/01/23 consistent with ILD  7. Snoring - Check sleep study. Has device at home.  - Encouraged him to use it  Follow up in 4 months with Dr.  Gala Romney + echo.   Jacklynn Ganong, FNP  11:44 AM

## 2023-06-02 ENCOUNTER — Ambulatory Visit (INDEPENDENT_AMBULATORY_CARE_PROVIDER_SITE_OTHER): Payer: PPO

## 2023-06-02 DIAGNOSIS — I255 Ischemic cardiomyopathy: Secondary | ICD-10-CM

## 2023-06-03 LAB — CUP PACEART REMOTE DEVICE CHECK
Battery Remaining Longevity: 34 mo
Battery Remaining Percentage: 31 %
Battery Voltage: 2.95 V
Brady Statistic RV Percent Paced: 1 %
Date Time Interrogation Session: 20240625020014
HighPow Impedance: 74 Ohm
HighPow Impedance: 74 Ohm
Implantable Lead Connection Status: 753985
Implantable Lead Implant Date: 20160323
Implantable Lead Location: 753860
Implantable Pulse Generator Implant Date: 20160323
Lead Channel Impedance Value: 360 Ohm
Lead Channel Pacing Threshold Amplitude: 0.75 V
Lead Channel Pacing Threshold Pulse Width: 0.5 ms
Lead Channel Sensing Intrinsic Amplitude: 12 mV
Lead Channel Setting Pacing Amplitude: 2.5 V
Lead Channel Setting Pacing Pulse Width: 0.5 ms
Lead Channel Setting Sensing Sensitivity: 0.5 mV
Pulse Gen Serial Number: 7255600
Zone Setting Status: 755011

## 2023-06-08 ENCOUNTER — Ambulatory Visit (HOSPITAL_COMMUNITY)
Admission: RE | Admit: 2023-06-08 | Discharge: 2023-06-08 | Disposition: A | Discharge status: Home / Self Care | Payer: PPO | Source: Ambulatory Visit | Attending: Cardiology | Admitting: Cardiology

## 2023-06-08 DIAGNOSIS — I5022 Chronic systolic (congestive) heart failure: Secondary | ICD-10-CM | POA: Diagnosis not present

## 2023-06-08 LAB — BASIC METABOLIC PANEL
Anion gap: 10 (ref 5–15)
BUN: 24 mg/dL — ABNORMAL HIGH (ref 8–23)
CO2: 26 mmol/L (ref 22–32)
Calcium: 8.8 mg/dL — ABNORMAL LOW (ref 8.9–10.3)
Chloride: 104 mmol/L (ref 98–111)
Creatinine, Ser: 1.41 mg/dL — ABNORMAL HIGH (ref 0.61–1.24)
GFR, Estimated: 53 mL/min — ABNORMAL LOW (ref >60)
Glucose, Bld: 106 mg/dL — ABNORMAL HIGH (ref 70–99)
Potassium: 4.6 mmol/L (ref 3.5–5.1)
Sodium: 140 mmol/L (ref 135–145)

## 2023-06-10 ENCOUNTER — Encounter: Payer: Self-pay | Admitting: Cardiology

## 2023-06-25 NOTE — Progress Notes (Signed)
Remote ICD transmission.   

## 2023-06-30 ENCOUNTER — Telehealth (HOSPITAL_COMMUNITY): Payer: Self-pay | Admitting: *Deleted

## 2023-06-30 NOTE — Telephone Encounter (Signed)
Left voicemail to remind patient of CPX appointment on Monday 7/29 at 11:00am. Provided patient with call back [phone] if cancellation/reschedule is necessary.       Reggy Eye, MS, ACSM, [facility] Clinical Exercise Physiologist/ Health and Wellness Coach

## 2023-07-02 DIAGNOSIS — E538 Deficiency of other specified B group vitamins: Secondary | ICD-10-CM | POA: Diagnosis not present

## 2023-07-04 ENCOUNTER — Other Ambulatory Visit (HOSPITAL_COMMUNITY): Payer: Self-pay | Admitting: Internal Medicine

## 2023-07-06 ENCOUNTER — Ambulatory Visit (HOSPITAL_COMMUNITY): Payer: PPO | Attending: Cardiology

## 2023-07-06 DIAGNOSIS — I241 Dressler's syndrome: Secondary | ICD-10-CM | POA: Diagnosis not present

## 2023-07-06 DIAGNOSIS — I5022 Chronic systolic (congestive) heart failure: Secondary | ICD-10-CM

## 2023-07-06 DIAGNOSIS — G4730 Sleep apnea, unspecified: Secondary | ICD-10-CM | POA: Insufficient documentation

## 2023-07-06 DIAGNOSIS — I252 Old myocardial infarction: Secondary | ICD-10-CM | POA: Insufficient documentation

## 2023-07-06 DIAGNOSIS — I110 Hypertensive heart disease with heart failure: Secondary | ICD-10-CM | POA: Diagnosis not present

## 2023-07-06 DIAGNOSIS — E785 Hyperlipidemia, unspecified: Secondary | ICD-10-CM | POA: Diagnosis not present

## 2023-07-06 DIAGNOSIS — Z95810 Presence of automatic (implantable) cardiac defibrillator: Secondary | ICD-10-CM | POA: Diagnosis not present

## 2023-07-13 NOTE — Addendum Note (Signed)
Encounter addended by: Jacklynn Ganong, FNP on: 07/13/2023 8:18 AM  Actions taken: Clinical Note Signed

## 2023-07-17 ENCOUNTER — Ambulatory Visit (HOSPITAL_COMMUNITY)
Admission: RE | Admit: 2023-07-17 | Discharge: 2023-07-17 | Disposition: A | Discharge status: Home / Self Care | Payer: PPO | Source: Ambulatory Visit | Attending: Internal Medicine | Admitting: Internal Medicine

## 2023-07-17 ENCOUNTER — Encounter (HOSPITAL_COMMUNITY): Payer: Self-pay | Admitting: Internal Medicine

## 2023-07-17 VITALS — BP 104/64 | HR 71 | Wt 215.4 lb

## 2023-07-17 DIAGNOSIS — I5022 Chronic systolic (congestive) heart failure: Secondary | ICD-10-CM | POA: Insufficient documentation

## 2023-07-17 DIAGNOSIS — Z7982 Long term (current) use of aspirin: Secondary | ICD-10-CM | POA: Insufficient documentation

## 2023-07-17 DIAGNOSIS — Z7902 Long term (current) use of antithrombotics/antiplatelets: Secondary | ICD-10-CM | POA: Insufficient documentation

## 2023-07-17 DIAGNOSIS — Z87891 Personal history of nicotine dependence: Secondary | ICD-10-CM | POA: Diagnosis not present

## 2023-07-17 DIAGNOSIS — I471 Supraventricular tachycardia, unspecified: Secondary | ICD-10-CM | POA: Diagnosis not present

## 2023-07-17 DIAGNOSIS — J849 Interstitial pulmonary disease, unspecified: Secondary | ICD-10-CM | POA: Insufficient documentation

## 2023-07-17 DIAGNOSIS — J439 Emphysema, unspecified: Secondary | ICD-10-CM | POA: Insufficient documentation

## 2023-07-17 DIAGNOSIS — I4901 Ventricular fibrillation: Secondary | ICD-10-CM | POA: Diagnosis not present

## 2023-07-17 DIAGNOSIS — I2510 Atherosclerotic heart disease of native coronary artery without angina pectoris: Secondary | ICD-10-CM | POA: Diagnosis not present

## 2023-07-17 DIAGNOSIS — I255 Ischemic cardiomyopathy: Secondary | ICD-10-CM | POA: Diagnosis not present

## 2023-07-17 DIAGNOSIS — R0683 Snoring: Secondary | ICD-10-CM | POA: Insufficient documentation

## 2023-07-17 DIAGNOSIS — Z79899 Other long term (current) drug therapy: Secondary | ICD-10-CM | POA: Insufficient documentation

## 2023-07-17 DIAGNOSIS — I4891 Unspecified atrial fibrillation: Secondary | ICD-10-CM | POA: Diagnosis not present

## 2023-07-17 DIAGNOSIS — I252 Old myocardial infarction: Secondary | ICD-10-CM | POA: Diagnosis not present

## 2023-07-17 DIAGNOSIS — Z4502 Encounter for adjustment and management of automatic implantable cardiac defibrillator: Secondary | ICD-10-CM | POA: Diagnosis not present

## 2023-07-17 LAB — CBC
HCT: 41.8 % (ref 39.0–52.0)
Hemoglobin: 14.1 g/dL (ref 13.0–17.0)
MCH: 31.4 pg (ref 26.0–34.0)
MCHC: 33.7 g/dL (ref 30.0–36.0)
MCV: 93.1 fL (ref 80.0–100.0)
Platelets: 266 10*3/uL (ref 150–400)
RBC: 4.49 MIL/uL (ref 4.22–5.81)
RDW: 13.9 % (ref 11.5–15.5)
WBC: 8.2 10*3/uL (ref 4.0–10.5)
nRBC: 0 % (ref 0.0–0.2)

## 2023-07-17 LAB — COMPREHENSIVE METABOLIC PANEL
ALT: 20 U/L (ref 0–44)
AST: 20 U/L (ref 15–41)
Albumin: 3.7 g/dL (ref 3.5–5.0)
Alkaline Phosphatase: 54 U/L (ref 38–126)
Anion gap: 10 (ref 5–15)
BUN: 16 mg/dL (ref 8–23)
CO2: 25 mmol/L (ref 22–32)
Calcium: 8.9 mg/dL (ref 8.9–10.3)
Chloride: 101 mmol/L (ref 98–111)
Creatinine, Ser: 1.35 mg/dL — ABNORMAL HIGH (ref 0.61–1.24)
GFR, Estimated: 55 mL/min — ABNORMAL LOW (ref >60)
Glucose, Bld: 109 mg/dL — ABNORMAL HIGH (ref 70–99)
Potassium: 4.8 mmol/L (ref 3.5–5.1)
Sodium: 136 mmol/L (ref 135–145)
Total Bilirubin: 0.6 mg/dL (ref 0.3–1.2)
Total Protein: 6.3 g/dL — ABNORMAL LOW (ref 6.5–8.1)

## 2023-07-17 LAB — SEDIMENTATION RATE: Sed Rate: 12 mm/hr (ref 0–16)

## 2023-07-17 NOTE — Patient Instructions (Signed)
Medication Changes:  No Changes In Medications at this time.   Lab Work:  Labs done today, your results will be available in MyChart, we will contact you for abnormal readings.   Testing/Procedures:  HIGH RESOLUTION CHEST CT- SOMEONE WILL CALL YOU TO GET SCHEDULED FOR THIS ONCE APPROVED WITH YOUR INSURANCE  Follow-Up in: 3-4 MONTHS WITH DR. Gala Romney PLEASE CALL OUR OFFICE AROUND OCTOBER TO GET SCHEDULED FOR YOUR APPOINTMENT. PHONE NUMBER IS [phone] OPTION 2   At the [facility], you and your health needs are our priority. We have a designated team specialized in the treatment of Heart Failure. This Care Team includes your primary Heart Failure Specialized Cardiologist (physician), Advanced Practice Providers (APPs- Physician Assistants and Nurse Practitioners), and Pharmacist who all work together to provide you with the care you need, when you need it.   You may see any of the following providers on your designated Care Team at your next follow up:  Dr. Arvilla Meres Dr. Marca Ancona Dr. Marcos Eke, NP Robbie Lis, [place] [facility] [place], [place] Brynda Peon, NP Karle Plumber, PharmD   Please be sure to bring in all your medications bottles to every appointment.   Need to Contact us:  If you have any questions or concerns before your next appointment please send [place] a message through [place] or call our office at [phone].    TO LEAVE A MESSAGE FOR THE NURSE SELECT OPTION 2, PLEASE LEAVE A MESSAGE INCLUDING: YOUR NAME DATE OF BIRTH CALL BACK NUMBER REASON FOR CALL**this is important as we prioritize the call backs  YOU WILL RECEIVE A CALL BACK THE SAME DAY AS LONG AS YOU CALL BEFORE 4:00 PM

## 2023-07-17 NOTE — Progress Notes (Signed)
Patient ID: Kyle Bullock, male   DOB: 11-04-1951, 72 y.o.   MRN: [id number]    Advanced Heart Failure Clinic Note    History of Present Illness:  Kyle Bullock is a 72 y.o. male who is referred to the HF Clinic by Dr. Excell Seltzer. He has h/o CAD and ischemic cardiomyopathy 20-25%.  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    In December 2015, he suffered an acute anterolateral MI complicated by VF arrest in [place]. He underwent drug-eluting stent placement to the LAD and PTCA of the diagonal. EF was 25% and a life vest was placed prior to discharge. Following return to [place], he developed dyspnea and presented to [facility] where he was readmitted. His Brilinta was switched to Effient as it was felt the Brilinta may have been playing a role in his dyspnea. He was also switched from an ACE inhibitor to an ARB. He was seen by electrophysiology with recommendation for ongoing life vest therapy and follow-up echo in 90 days post revascularization. He was then seen in clinic in mid December where he reported pleuritic chest pain. He was readmitted and underwent diagnostic catheterization revealing patency of previously placed stent and area of balloon angioplasty in the diagonal. EF was 20-25% with diffuse hypokinesis and normal pericardial appearance. He did have some hypotension requiring discontinuation of ARB therapy. His beta blocker was also down titrated. He was diagnosed with Dressler's syndrome and was placed on prednisone taper and colchicine.  CT with COPD. Referred to Dr. Kendrick Fries who felt he had mild COPD but no ILD. Given PRN albuterol. Has repeat CT scheduled for 1 year.   Had ATP in 7/22. Found on device interrogation. Seen by EP. According to EP device showed initially irregular arrhythmia concerning for Afib. He then developed a regular tachy arrhythmia (likely VT) which was terminated with ATP. After ATP, his rhythm was again regular. Zio placed showed SVT with  some ectopy. No VT or AF.   Cath in 8/22 for CP with stable CAD.   Had ICD shock 12/29 while he was stacking wood and carrying large loads, when he received an ICD shock. Seen by EP. Felt to have AF/AFL possibly leading to VT. Zio placed showing 5% AF/AFL. Started on amio. Eliquis resumed.   Echo 1/23 EF 25-30%   Had AF ablation 08/04/22.   In May 2024. Hi-res CT: + ILD. Saw Dr. Sherene Sires and Evie Lacks stopped. Started prednisone  Seen recently in [facility].  CPX ordered. Was on prednisone 40 x 2 weeks, 20 x 2 weeks and 10 x 2 weeks  Returns for f/u with his wife. Feeling much better back to walking 2-2.5 miles/day on 3-4 days per week. Stamina not back to where it was before. Some swelling in belly. More lightheaded than usual. Not taking BP regularly.   CPX 07/06/23  FVC 3.02 (62%)      FEV1 2.41 (66%)        FEV1/FVC 80 (106%)        MVV 119 (84%)   BP rest: 94/60 Standing BP: 90/62 BP peak: 132/60  Peak VO2: 15.0 (57% predicted peak VO2)  VE/VCO2 slope:  44  OUES: 1.63  Peak RER: 1.12   Here for f/u with his wife yo discuss CPX results   Cardiac studies:   Cath 8/22   Prox RCA to Mid RCA lesion is 55% stenosed.   1st Mrg lesion is 30% stenosed.   Ost LAD to  Prox LAD lesion is 30% stenosed.   Previously placed Prox LAD to Dist LAD stent (unknown type) is  widely patent.   Findings:   Ao = 104/57 (77)  LV = 107/7 RA =  3 RV = 23/1 PA = 21/4 (10) PCW = 3 Fick cardiac output/index = 6.0/2.7 PVR = 1.2 WU Ao sat = 99% PA sat = 74%, 74%   Assessment: CAD with widely patent LAD stent Non-obstructive CAD in RCA and LCX with ~ 60% lesion throughout [facility] ICM with EF 25% Low filling pressures with preserved CO    Echo 3/16 EF 25-30% Echo 03/2016 EF 20-25% Echo 9/18 EF 20-25% RV normal   CPX 2/18 FVC 3.49 (72%)      FEV1 2.49 (68%)        FEV1/FVC 71 (93%)        MVV 117 (82%) Resting HR: 62 Peak HR: 148   (97% age predicted max HR) BP rest: 84/60 BP peak:  156/62 Peak VO2: 19.6 (69% predicted peak VO2) VE/VCO2 slope:  37 OUES: 2.14 Peak RER: 1.06 Ventilatory Threshold: 17.4 (61% predicted or measured peak VO2) VE/MVV:  66% O2pulse:  12   (71% predicted O2pulse)  CPX 05/14/2015  Peak VO2: 19.0 (64.5% predicted peak VO2) VE/VCO2 slope:  41.5 OUES: 2.01 Peak RER: 1.06 Ventilatory Threshold: 14.1          (47.9% predicted or measured peak VO2)   Past Medical History:  Diagnosis Date   Abnormal TSH    a. 11/2014: felt d/t sick euthyroid.   AICD (automatic cardioverter/defibrillator) present 02/28/2015   CAD (coronary artery disease)    a. 11/2014: arrest/STEMI s/p Xience DES to the LAD and PTCA to the D1 [place], [place]). b. Relook cath 11/2014: no culprit, patent stent.   CHF (congestive heart failure) (HCC)    Dressler's syndrome (HCC)    a. 11/2014: tx with colchicine and prednisone.   Dyslipidemia    Dyspnea    a. 11/2014: possibly due to combo of CHF and Brilinta. Brilinta changed to Effient.   Erectile dysfunction    a. Pt aware not to take ED med within 24 hr of NTG and vice versa.   Hypertension    Ischemic cardiomyopathy    a. 11/2014: EF reportedly 20% by cath and echo in [place].   NSVT (nonsustained ventricular tachycardia) (HCC) 11/16/14   a. 11/2014: admitted after discharge from STEMI admission, 18 beats 135-140bpm in ED.   Pericardial effusion    a. 11/2014.   Pericarditis dx'd 11/2014   Pneumonia 1990's X 2   RBBB    Sleep apnea    suspected but not diagnosed (02/28/2015)   STEMI (ST elevation myocardial infarction) (HCC) 11/10/2014   Tobacco abuse    VF (ventricular fibrillation) (HCC) 11/10/14   a. 11/2014: arrest with STEMI.    Past Surgical History:  Procedure Laterality Date   A-FLUTTER ABLATION N/A 09/04/2020   Procedure: A-FLUTTER ABLATION;  Surgeon: Hillis Range, MD;  Location: [facility] INVASIVE CV [facility];  Service: Cardiovascular;  Laterality: N/A;   ATRIAL FIBRILLATION ABLATION N/A 08/04/2022   Procedure:  ATRIAL FIBRILLATION ABLATION;  Surgeon: Lanier Prude, MD;  Location: [facility] INVASIVE CV LAB;  Service: Cardiovascular;  Laterality: N/A;   CARDIAC CATHETERIZATION  11/19/2014   CARDIAC DEFIBRILLATOR PLACEMENT  02/28/2015   COLONOSCOPY WITH PROPOFOL N/A 03/16/2023   Procedure: COLONOSCOPY WITH PROPOFOL;  Surgeon: Imogene Burn, MD;  Location: [facility] ENDOSCOPY;  Service: Gastroenterology;  Laterality: N/A;   CORONARY ANGIOPLASTY  WITH STENT PLACEMENT  11/10/2014   LAD DES   HAND SURGERY Bilateral    Dupuytren's Contracture   IMPLANTABLE CARDIOVERTER DEFIBRILLATOR IMPLANT N/A 02/28/2015   SJM Fortify Assura VR ICD implanted by Dr Johney Frame.  Revascularized following VF arrest, EF remained depressed and ICD implanted   KNEE ARTHROSCOPY Right 12/08/1980   LEFT HEART CATH N/A 11/19/2014   Procedure: LEFT HEART CATH;  Surgeon: Runell Gess, MD;  Location: [facility] CATH LAB;  Service: Cardiovascular;  Laterality: N/A;   POLYPECTOMY  03/16/2023   Procedure: POLYPECTOMY;  Surgeon: Imogene Burn, MD;  Location: [facility] ENDOS[facility];  Service: Gastroenterology;;   RIGHT/LEFT HEART CATH AND CORONARY ANGIOGRAPHY N/A 07/22/2021   Procedure: RIGHT/LEFT HEART CATH AND CORONARY ANGIOGRAPHY;  Surgeon: Dolores Patty, MD;  Location: [facility] CV [facility];  Service: Cardiovascular;  Laterality: N/A;    Current Outpatient Medications  Medication Sig Dispense Refill   acetaminophen (TYLENOL) 500 MG tablet Take 500 mg by mouth every 8 (eight) hours as needed for moderate pain.     apixaban (ELIQUIS) 5 MG TABS tablet Take 1 tablet (5 mg total) by mouth 2 (two) times daily. 60 tablet 5   atorvastatin (LIPITOR) 20 MG tablet TAKE 1 TABLET BY MOUTH DAILY 90 tablet 3   cyanocobalamin (VITAMIN B12) 1000 MCG tablet Take 1,000 mcg by mouth daily.     furosemide (LASIX) 20 MG tablet TAKE 1 TABLET BY MOUTH DAILY AS NEEDED 30 tablet 2   levothyroxine (SYNTHROID) 112 MCG tablet Take 112 mcg by mouth daily before breakfast.     metoprolol  succinate (TOPROL-XL) 25 MG 24 hr tablet Take 0.5 tablets (12.5 mg total) by mouth daily. 45 tablet 2   nitroGLYCERIN (NITROSTAT) 0.4 MG SL tablet PLACE 1 TABLET UNDER THE TONGUE EVERY 5 MINUTES AS NEEDED FOR CHEST PAIN 25 tablet 3   omega-3 acid ethyl esters (LOVAZA) 1 g capsule Take 2 g by mouth 2 (two) times daily.     Probiotic Product (PROBIOTIC PO) Take 1 tablet by mouth every other day.     sacubitril-valsartan (ENTRESTO) 24-26 MG Take 1 tablet by mouth 2 (two) times daily. 180 tablet 3   spironolactone (ALDACTONE) 25 MG tablet Take 1 tablet (25 mg total) by mouth daily. 90 tablet 3   TRELEGY ELLIPTA 100-62.5-25 MCG/ACT AEPB Take 1 puff by mouth daily.     No current facility-administered medications for this encounter.    Allergies:   Ambien [zolpidem tartrate], Lisinopril, and Brilinta [ticagrelor]   Social History:  The patient  reports that he quit smoking about 8 years ago. His smoking use included cigarettes. He started smoking about 48 years ago. He has a 80 pack-year smoking history. He has never used smokeless tobacco. He reports current alcohol use of about 14.0 standard drinks of alcohol per week. He reports that he does not use drugs.   Family History:  The patient's  family history includes COPD in his mother; Crohn's disease in his brother; Heart disease in his mother; Hypertension in his father and mother; Prostate cancer in his father.   Review of systems complete and found to be negative unless listed in HPI.     There were no vitals filed for this visit.   Wt Readings from Last 3 Encounters:  06/01/23 97.7 kg (215 lb 6.4 oz)  05/18/23 97.1 kg (214 lb)  05/08/23 97.1 kg (214 lb)   Physical exam: General:  Well appearing. No resp difficulty HEENT: normal Neck: supple. no JVD. Carotids  2+ bilat; no bruits. No lymphadenopathy or thryomegaly appreciated. Cor: PMI nondisplaced. Regular rate & rhythm. No rubs, gallops or murmurs. Lungs: clear Abdomen: obese soft,  nontender, nondistended. No hepatosplenomegaly. No bruits or masses. Good bowel sounds. Extremities: no cyanosis, clubbing, rash, edema Neuro: alert & orientedx3, cranial nerves grossly intact. moves all 4 extremities w/o difficulty. Affect pleasant  Recent Labs: 12/31/2022: ALT 16; TSH 5.050 05/08/2023: Hemoglobin 14.9; Platelets 275.0; Pro B Natriuretic peptide (BNP) 433.0 06/01/2023: B Natriuretic Peptide 851.1 06/08/2023: BUN 24; Creatinine, Ser 1.41; Potassium 4.6; Sodium 140   Lipid Panel     Component Value Date/Time   CHOL 127 11/07/2021 1212   CHOL 101 12/04/2014 1030   TRIG 170 (H) 11/07/2021 1212   HDL 51 11/07/2021 1212   HDL 43 12/04/2014 1030   CHOLHDL 2.5 11/07/2021 1212   VLDL 34 11/07/2021 1212   LDLCALC 42 11/07/2021 1212   LDLCALC 39 12/04/2014 1030     ASSESSMENT AND PLAN:  1. Chronic systolic due to ischemic CM  - Echo 03/2016 LVEF 20-25%, Grade 2 DD - Echo 3/18 shows with stable LVEF 20-25%. Normal RV.  - Echo 9/18 EF 20-25%, grade 2 DD  - CPX 2018 pVO2 19.6 (69%) slope 37 RER 1.06 FEV1 (68%)  - CPX 7/24: pVO2: 15.0 (57%) slope 44 RER 1.12 - Echo 05/16/20  EF 25% RV normal. - Echo 1/23 EF 25-30%  - Doing well. Stable NYHA II  - Volume status stable on lasix 20 mg PRN - Continue Entresto 24/26.  Up-titration Limited by low BP  - Continue carvedilol 6.25 mg BID.  - Continue sprio 25 mg daily.   - Continue Farxiga 10 - ICD interrogated personally. No VT. Volume ok - Discussed repeating CPX test at next visit for risk stratification  - Recent labs reviewed. OK.   2.  CAD s/p Anterior MI December 2015:  - Cath 8/22 with stable CAD - No s/s angina - Continue ASA, statin and Effient  - Lipids per Dr. Clelia Croft.   3. AF/AFL - seen on zio 5% AF burden - now on amio/eliqus - Had AF ablation 08/04/22 remains off amio  4. H/o VT - had SVT/VT in 12/22 - Device reprogrammed. Off amio   5. Tobacco abuse:  - Remains quit  6. Emphysema - Sees pulmonary.  Spirometry on CPX previously reviewed with only mild disease.  - Hi-res CT 5/24 suggestive of ILD - Refer to Dr. Marchelle Gearing   7. Snoring - Check sleep study. Has device at home.  - Encouraged him to take it    Arvilla Meres, MD  8:44 AM

## 2023-08-17 ENCOUNTER — Ambulatory Visit (HOSPITAL_COMMUNITY)
Admission: RE | Admit: 2023-08-17 | Discharge: 2023-08-17 | Disposition: A | Discharge status: Home / Self Care | Payer: PPO | Source: Ambulatory Visit | Attending: Internal Medicine | Admitting: Internal Medicine

## 2023-08-17 ENCOUNTER — Other Ambulatory Visit: Payer: Self-pay | Admitting: Cardiology

## 2023-08-17 DIAGNOSIS — E785 Hyperlipidemia, unspecified: Secondary | ICD-10-CM | POA: Diagnosis not present

## 2023-08-17 DIAGNOSIS — R918 Other nonspecific abnormal finding of lung field: Secondary | ICD-10-CM | POA: Diagnosis not present

## 2023-08-17 DIAGNOSIS — J849 Interstitial pulmonary disease, unspecified: Secondary | ICD-10-CM | POA: Insufficient documentation

## 2023-08-17 DIAGNOSIS — I483 Typical atrial flutter: Secondary | ICD-10-CM | POA: Diagnosis not present

## 2023-08-17 DIAGNOSIS — J432 Centrilobular emphysema: Secondary | ICD-10-CM | POA: Diagnosis not present

## 2023-08-17 DIAGNOSIS — I4891 Unspecified atrial fibrillation: Secondary | ICD-10-CM

## 2023-08-17 DIAGNOSIS — I700 Atherosclerosis of aorta: Secondary | ICD-10-CM | POA: Diagnosis not present

## 2023-08-17 DIAGNOSIS — I2510 Atherosclerotic heart disease of native coronary artery without angina pectoris: Secondary | ICD-10-CM | POA: Diagnosis not present

## 2023-08-17 DIAGNOSIS — I10 Essential (primary) hypertension: Secondary | ICD-10-CM | POA: Diagnosis not present

## 2023-08-17 DIAGNOSIS — I5022 Chronic systolic (congestive) heart failure: Secondary | ICD-10-CM | POA: Diagnosis not present

## 2023-08-17 DIAGNOSIS — J8410 Pulmonary fibrosis, unspecified: Secondary | ICD-10-CM | POA: Diagnosis not present

## 2023-08-17 DIAGNOSIS — D6869 Other thrombophilia: Secondary | ICD-10-CM | POA: Diagnosis not present

## 2023-08-17 DIAGNOSIS — I272 Pulmonary hypertension, unspecified: Secondary | ICD-10-CM | POA: Diagnosis not present

## 2023-08-17 DIAGNOSIS — R7301 Impaired fasting glucose: Secondary | ICD-10-CM | POA: Diagnosis not present

## 2023-08-17 DIAGNOSIS — E781 Pure hyperglyceridemia: Secondary | ICD-10-CM | POA: Diagnosis not present

## 2023-08-17 NOTE — Telephone Encounter (Signed)
Prescription refill request for Eliquis received. Indication:PE Last office visit:6/24 Scr:1.35  8/24 Age: 72 Weight:97.7  KG  PRESCRIPTION REFILLED

## 2023-08-20 ENCOUNTER — Other Ambulatory Visit (HOSPITAL_COMMUNITY): Payer: Self-pay | Admitting: Internal Medicine

## 2023-08-21 ENCOUNTER — Telehealth: Payer: Self-pay | Admitting: Internal Medicine

## 2023-08-21 NOTE — Telephone Encounter (Signed)
Patient is being referred by Dr Clelia Croft. He is requesting for patient to switch providers to Dr Marchelle Gearing to manage patient's ILD. LOV 05/08/23. Please advise on switch.

## 2023-08-21 NOTE — Telephone Encounter (Signed)
Fine with me

## 2023-08-24 NOTE — Telephone Encounter (Signed)
He does appear to have ILD  Plan  -Given the ILD questionnaire - Also have him do a full pulmonary function test [last 12/27/2017] - Do blood work for CBC with differential, ESR, ANA, rheumatoid factor, CCP, double-stranded DNA, SSA, SSB and SCL-70 and QuantiFERON gold  -30-minute appointment to see me; no need to wait for PFTs to get this done        Latest Ref Rng & Units 06/22/2018    1:52 PM 06/14/2015   10:58 AM  PFT Results  FVC-Pre L 3.55  3.33   FVC-Predicted Pre % 66  61   FVC-Post L 3.58    FVC-Predicted Post % 66    Pre FEV1/FVC % % 72  73   Post FEV1/FCV % % 68    FEV1-Pre L 2.55  2.42   FEV1-Predicted Pre % 64  59   FEV1-Post L 2.42    DLCO uncorrected ml/min/mmHg 16.18  15.23   DLCO UNC% % 41  38   DLVA Predicted % 64  61   TLC L 5.55  5.74   TLC % Predicted % 69  71   RV % Predicted % 71  90

## 2023-09-01 ENCOUNTER — Other Ambulatory Visit: Payer: Self-pay

## 2023-09-01 ENCOUNTER — Ambulatory Visit: Payer: PPO

## 2023-09-01 DIAGNOSIS — I5022 Chronic systolic (congestive) heart failure: Secondary | ICD-10-CM

## 2023-09-01 DIAGNOSIS — I480 Paroxysmal atrial fibrillation: Secondary | ICD-10-CM

## 2023-09-01 DIAGNOSIS — I10 Essential (primary) hypertension: Secondary | ICD-10-CM

## 2023-09-01 DIAGNOSIS — I255 Ischemic cardiomyopathy: Secondary | ICD-10-CM

## 2023-09-01 LAB — CUP PACEART REMOTE DEVICE CHECK
Battery Remaining Longevity: 31 mo
Battery Remaining Percentage: 29 %
Battery Voltage: 2.95 V
Brady Statistic RV Percent Paced: 1 %
Date Time Interrogation Session: 20240924020016
HighPow Impedance: 74 Ohm
HighPow Impedance: 74 Ohm
Implantable Lead Connection Status: 753985
Implantable Lead Implant Date: 20160323
Implantable Lead Location: 753860
Implantable Pulse Generator Implant Date: 20160323
Lead Channel Impedance Value: 380 Ohm
Lead Channel Pacing Threshold Amplitude: 0.75 V
Lead Channel Pacing Threshold Pulse Width: 0.5 ms
Lead Channel Sensing Intrinsic Amplitude: 12 mV
Lead Channel Setting Pacing Amplitude: 2.5 V
Lead Channel Setting Pacing Pulse Width: 0.5 ms
Lead Channel Setting Sensing Sensitivity: 0.5 mV
Pulse Gen Serial Number: 7255600
Zone Setting Status: 755011

## 2023-09-07 ENCOUNTER — Other Ambulatory Visit (HOSPITAL_COMMUNITY): Payer: Self-pay

## 2023-09-07 DIAGNOSIS — I5022 Chronic systolic (congestive) heart failure: Secondary | ICD-10-CM

## 2023-09-07 DIAGNOSIS — I10 Essential (primary) hypertension: Secondary | ICD-10-CM

## 2023-09-07 DIAGNOSIS — I255 Ischemic cardiomyopathy: Secondary | ICD-10-CM

## 2023-09-07 DIAGNOSIS — I480 Paroxysmal atrial fibrillation: Secondary | ICD-10-CM

## 2023-09-07 MED ORDER — SPIRONOLACTONE 25 MG PO TABS
25.0000 mg | ORAL_TABLET | Freq: Every day | ORAL | 3 refills | Status: DC
Start: 2023-09-07 — End: 2023-10-14

## 2023-09-09 ENCOUNTER — Ambulatory Visit (HOSPITAL_COMMUNITY)
Admission: RE | Admit: 2023-09-09 | Discharge: 2023-09-09 | Disposition: A | Discharge status: Home / Self Care | Payer: PPO | Source: Ambulatory Visit | Attending: Internal Medicine | Admitting: Internal Medicine

## 2023-09-09 DIAGNOSIS — I340 Nonrheumatic mitral (valve) insufficiency: Secondary | ICD-10-CM | POA: Insufficient documentation

## 2023-09-09 DIAGNOSIS — I5022 Chronic systolic (congestive) heart failure: Secondary | ICD-10-CM | POA: Diagnosis not present

## 2023-09-09 DIAGNOSIS — I2510 Atherosclerotic heart disease of native coronary artery without angina pectoris: Secondary | ICD-10-CM | POA: Diagnosis not present

## 2023-09-09 DIAGNOSIS — I110 Hypertensive heart disease with heart failure: Secondary | ICD-10-CM | POA: Insufficient documentation

## 2023-09-09 DIAGNOSIS — E785 Hyperlipidemia, unspecified: Secondary | ICD-10-CM | POA: Insufficient documentation

## 2023-09-09 MED ORDER — PERFLUTREN LIPID MICROSPHERE
1.0000 mL | INTRAVENOUS | Status: AC | PRN
  Administered 2023-09-09: 2 mL via INTRAVENOUS

## 2023-09-10 LAB — ECHOCARDIOGRAM COMPLETE
Area-P 1/2: 3.95 cm2
MV M vel: 3.42 m/s
MV Peak grad: 46.7 mm[Hg]
MV VTI: 2.78 cm2
S' Lateral: 5.1 cm

## 2023-09-11 ENCOUNTER — Encounter: Payer: PPO | Admitting: Cardiology

## 2023-09-15 ENCOUNTER — Telehealth: Payer: Self-pay

## 2023-09-15 NOTE — Telephone Encounter (Signed)
Patient returned call. Denies any symptoms and complaints. Reports compliance with Toprol-XL 12.5 mg daily. Has recall placed for 12/2022.   Routing to Dr. Lalla Brothers to advise if anything further is needed.

## 2023-09-15 NOTE — Telephone Encounter (Signed)
Following alert received from [employer] received for VT events occurred 10/3 18:36-18:42, there were 3 VT-1 episodes, 26-42sec in duration HR 200-203. There were 5 NSVT 18:36-18:37.  Attempted to contact patient to assess. No answer, LMTCB.

## 2023-09-16 NOTE — Telephone Encounter (Signed)
Left message for patient to call back  

## 2023-09-17 NOTE — Progress Notes (Signed)
Remote ICD transmission.   

## 2023-09-20 ENCOUNTER — Other Ambulatory Visit: Payer: Self-pay | Admitting: Cardiology

## 2023-09-22 NOTE — Progress Notes (Unsigned)
Electrophysiology Office Note:   ID:  Toan, [place] 09/15/1951, MRN [id number]  Primary Cardiologist: None Electrophysiologist: Lanier Prude, MD  {Click to update primary MD,subspecialty MD or APP then REFRESH:1}    History of Present Illness:   Kyle Bullock is a 72 y.o. male with h/o *** seen today for {VISITTYPE:28148}  Review of systems complete and found to be negative unless listed in HPI.   EP Information / Studies Reviewed:    {EKGtoday:28818}       ICD Interrogation-  reviewed in detail today,  See PACEART report.  Device History: St. Jude Single Chamber ICD implanted 2016 for chronic systolic CHF / ICM   Arrhythmia History  AF ablation 07/2022  Physical Exam:   VS:  There were no vitals taken for this visit.   Wt Readings from Last 3 Encounters:  07/17/23 215 lb 6.4 oz (97.7 kg)  06/01/23 215 lb 6.4 oz (97.7 kg)  05/18/23 214 lb (97.1 kg)     GEN: Well nourished, well developed in no acute distress NECK: No JVD; No carotid bruits CARDIAC: {EPRHYTHM:28826}, no murmurs, rubs, gallops RESPIRATORY:  Clear to auscultation without rales, wheezing or rhonchi  ABDOMEN: Soft, non-tender, non-distended EXTREMITIES:  No edema; No deformity   ASSESSMENT AND PLAN:    Chronic systolic dysfunction s/p Abbott single chamber ICD  euvolemic today Stable on an appropriate medical regimen Normal ICD function See Pace Art report No changes today  H/o VT/VF ***   Persistent atrial fibrillation EKG today shows *** Has been off amiodarone with concerns of SOB and possible amio induced lung toxicity.  Disposition:   Follow up with {EPPROVIDERS:28135} {EPFOLLOW UP:28173}   Signed, Graciella Freer, PA-C

## 2023-09-23 ENCOUNTER — Ambulatory Visit: Payer: PPO | Attending: Student | Admitting: Student

## 2023-09-23 ENCOUNTER — Encounter: Payer: Self-pay | Admitting: Student

## 2023-09-23 VITALS — BP 100/62 | HR 84 | Ht 75.0 in | Wt 212.4 lb

## 2023-09-23 DIAGNOSIS — I5022 Chronic systolic (congestive) heart failure: Secondary | ICD-10-CM | POA: Diagnosis not present

## 2023-09-23 DIAGNOSIS — Z95810 Presence of automatic (implantable) cardiac defibrillator: Secondary | ICD-10-CM | POA: Diagnosis not present

## 2023-09-23 DIAGNOSIS — I480 Paroxysmal atrial fibrillation: Secondary | ICD-10-CM | POA: Diagnosis not present

## 2023-09-23 DIAGNOSIS — I2510 Atherosclerotic heart disease of native coronary artery without angina pectoris: Secondary | ICD-10-CM | POA: Diagnosis not present

## 2023-09-23 DIAGNOSIS — I255 Ischemic cardiomyopathy: Secondary | ICD-10-CM

## 2023-09-23 LAB — CUP PACEART INCLINIC DEVICE CHECK
Battery Remaining Longevity: 31 mo
Brady Statistic RV Percent Paced: 0.27 %
Date Time Interrogation Session: 20241016094757
HighPow Impedance: 65.25 Ohm
Implantable Lead Connection Status: 753985
Implantable Lead Implant Date: 20160323
Implantable Lead Location: 753860
Implantable Pulse Generator Implant Date: 20160323
Lead Channel Impedance Value: 362.5 Ohm
Lead Channel Pacing Threshold Amplitude: 0.75 V
Lead Channel Pacing Threshold Amplitude: 0.75 V
Lead Channel Pacing Threshold Pulse Width: 0.5 ms
Lead Channel Pacing Threshold Pulse Width: 0.5 ms
Lead Channel Sensing Intrinsic Amplitude: 12 mV
Lead Channel Setting Pacing Amplitude: 2.5 V
Lead Channel Setting Pacing Pulse Width: 0.5 ms
Lead Channel Setting Sensing Sensitivity: 0.5 mV
Pulse Gen Serial Number: 7255600
Zone Setting Status: 755011

## 2023-09-23 LAB — BASIC METABOLIC PANEL
BUN/Creatinine Ratio: 12 (ref 10–24)
BUN: 17 mg/dL (ref 8–27)
CO2: 23 mmol/L (ref 20–29)
Calcium: 9.4 mg/dL (ref 8.6–10.2)
Chloride: 103 mmol/L (ref 96–106)
Creatinine, Ser: 1.41 mg/dL — ABNORMAL HIGH (ref 0.76–1.27)
Glucose: 86 mg/dL (ref 70–99)
Potassium: 4.7 mmol/L (ref 3.5–5.2)
Sodium: 140 mmol/L (ref 134–144)
eGFR: 53 mL/min/{1.73_m2} — ABNORMAL LOW (ref >59)

## 2023-09-23 LAB — MAGNESIUM: Magnesium: 1.9 mg/dL (ref 1.6–2.3)

## 2023-09-23 MED ORDER — METOPROLOL SUCCINATE ER 25 MG PO TB24: 25.0000 mg | ORAL_TABLET | Freq: Every day | ORAL | 3 refills | Status: DC

## 2023-09-23 NOTE — Patient Instructions (Signed)
Medication Instructions:  Increase metoprolol succinate (Toprol XL) to 25 mg daily at bedtime. *If you need a refill on your cardiac medications before your next appointment, please call your pharmacy*  Lab Work: BMET, MAG-TODAY If you have labs (blood work) drawn today and your tests are completely normal, you will receive your results only by: MyChart Message (if you have MyChart) OR A paper copy in the mail If you have any lab test that is abnormal or we need to change your treatment, we will call you to review the results.  Follow-Up: At [facility], you and your health needs are our priority.  As part of our continuing mission to provide you with exceptional heart care, we have created designated Provider Care Teams.  These Care Teams include your primary Cardiologist (physician) and Advanced Practice Providers (APPs -  Physician Assistants and Nurse Practitioners) who all work together to provide you with the care you need, when you need it.  Your next appointment:   3 month(s)  Provider:   Casimiro Needle "Otilio Saber, PA-C

## 2023-09-26 DIAGNOSIS — Z23 Encounter for immunization: Secondary | ICD-10-CM | POA: Diagnosis not present

## 2023-10-07 ENCOUNTER — Other Ambulatory Visit: Payer: Self-pay | Admitting: *Deleted

## 2023-10-07 DIAGNOSIS — R0609 Other forms of dyspnea: Secondary | ICD-10-CM

## 2023-10-08 ENCOUNTER — Ambulatory Visit (INDEPENDENT_AMBULATORY_CARE_PROVIDER_SITE_OTHER): Payer: PPO | Admitting: Internal Medicine

## 2023-10-08 ENCOUNTER — Encounter: Payer: Self-pay | Admitting: Internal Medicine

## 2023-10-08 ENCOUNTER — Ambulatory Visit: Payer: PPO | Admitting: Internal Medicine

## 2023-10-08 VITALS — BP 90/60 | HR 77 | Ht 75.0 in | Wt 212.2 lb

## 2023-10-08 DIAGNOSIS — J9611 Chronic respiratory failure with hypoxia: Secondary | ICD-10-CM | POA: Diagnosis not present

## 2023-10-08 DIAGNOSIS — J8410 Pulmonary fibrosis, unspecified: Secondary | ICD-10-CM

## 2023-10-08 DIAGNOSIS — R0609 Other forms of dyspnea: Secondary | ICD-10-CM

## 2023-10-08 DIAGNOSIS — J849 Interstitial pulmonary disease, unspecified: Secondary | ICD-10-CM

## 2023-10-08 DIAGNOSIS — R0902 Hypoxemia: Secondary | ICD-10-CM

## 2023-10-08 DIAGNOSIS — J432 Centrilobular emphysema: Secondary | ICD-10-CM | POA: Diagnosis not present

## 2023-10-08 DIAGNOSIS — J439 Emphysema, unspecified: Secondary | ICD-10-CM

## 2023-10-08 LAB — CBC WITH DIFFERENTIAL/PLATELET
Basophils Absolute: 0.1 10*3/uL (ref 0.0–0.1)
Basophils Relative: 0.9 % (ref 0.0–3.0)
Eosinophils Absolute: 0.1 10*3/uL (ref 0.0–0.7)
Eosinophils Relative: 1.4 % (ref 0.0–5.0)
HCT: 43 % (ref 39.0–52.0)
Hemoglobin: 14 g/dL (ref 13.0–17.0)
Lymphocytes Relative: 10.8 % — ABNORMAL LOW (ref 12.0–46.0)
Lymphs Abs: 0.9 10*3/uL (ref 0.7–4.0)
MCHC: 32.6 g/dL (ref 30.0–36.0)
MCV: 96.2 fL (ref 78.0–100.0)
Monocytes Absolute: 0.8 10*3/uL (ref 0.1–1.0)
Monocytes Relative: 9.1 % (ref 3.0–12.0)
Neutro Abs: 6.8 10*3/uL (ref 1.4–7.7)
Neutrophils Relative %: 77.8 % — ABNORMAL HIGH (ref 43.0–77.0)
Platelets: 291 10*3/uL (ref 150.0–400.0)
RBC: 4.47 Mil/uL (ref 4.22–5.81)
RDW: 13.8 % (ref 11.5–15.5)
WBC: 8.7 10*3/uL (ref 4.0–10.5)

## 2023-10-08 LAB — SEDIMENTATION RATE: Sed Rate: 20 mm/h (ref 0–20)

## 2023-10-08 NOTE — Patient Instructions (Addendum)
ICD-10-CM   1. ILD (interstitial lung disease) (HCC)  J84.9 CBC w/Diff    Sed Rate (ESR)    Rheumatoid Factor    Cyclic citrul peptide antibody, IgG    QuantiFERON-TB Gold Plus    ANA+ENA+DNA/DS+Scl 70+SjoSSA/B    Hypersensitivity Pneumonitis    Ambulatory Referral for DME    Hypersensitivity Pneumonitis    CBC w/Diff    Sed Rate (ESR)    Rheumatoid Factor    Cyclic citrul peptide antibody, IgG    ANA+ENA+DNA/DS+Scl 70+SjoSSA/B    QuantiFERON-TB Gold Plus    2. Centrilobular emphysema (HCC)  J43.2     3. DOE (dyspnea on exertion)  R06.09     4. 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 Exercise hypoxemia  R09.02     6. Chronic respiratory failure with hypoxia (HCC)  J96.11 Ambulatory Referral for DME       YOu have emphysema but you also have fibrosis Fibrosis did not seem to be there in 2018 but started in 2019 and progressive mildly since then Cause not known but amiodarone 2021 and 2023  and covid 2022 might have helped it a bit getting worse (maybe not)  Plan  - continue trelegy - check serology blood work  - check Hypersensitivity Penumonitis panel blood work  - will discuss in case conference - will review ILD quetsionnaire - start 4L [place] portable  - check oNO on room air - stay off amiodarone -repeat spirometry and dlco in 6-8 weeks to capture any tendency to get worse fast  Followup  - return in 8 weeks to discuss next steps - ILD clinic  - consider anti-fibrotic

## 2023-10-08 NOTE — Progress Notes (Signed)
SATURATION QUALIFICATIONS: (This note is used to comply with regulatory documentation for home oxygen) 10/08/23   Patient Saturations on Room Air at Rest = 92%  Patient Saturations on Room Air while Ambulating = 85%  Patient Saturations on 4 Liters of oxygen while Ambulating = 96%

## 2023-10-08 NOTE — Progress Notes (Signed)
Full PFT performed today. °

## 2023-10-08 NOTE — Patient Instructions (Signed)
Full PFT performed today. °

## 2023-10-08 NOTE — Progress Notes (Signed)
Synopsis: Referred in 2016 for evaluation of COPD. Has ischemic cardiomyopathy after a large MI. LVEF 25-30% as per echocardiogram in March 2016. December 2015 CT angiogram chest: No pulmonary embolism, mild centrilobular and paraseptal emphysema and upper lobe distribution bilaterally, small pleural effusion on the left, there is dependent groundglass and mild interlobular septal thickening bilaterally Spirometry as part of June 2016 cardiopulmonary exercise test: Ratio 73%, FEV1 2.4 L (61% predicted), FVC 3.27 L (66% predicted). 06/2015 CT chest HRCT> very mild patches of ground glass in the bases of both lungs with non-specific interstitial changes, no clear fibrotic change; scattered pulmonary nodules appear stable compared to 11/2014, emphysema and bronchial wall thickening noted July 2016 pulmonary function testing: Ratio 73% predicted, FEV1 2.42 L (59% predicted) FVC 3.33 L (61% predicted), total lung capacity 5.74 L (71% predicted), DLCO 15.23 (38% predicted), Hgb 13.5  = COPD "GOLD 0"  [place] imp/plan  06/25/15  Centrilobular emphysema  CT scan from July 2016. This showed mild centrilobular emphysema bilaterally. There was a question of some very mild nonspecific interstitial changes in the periphery of the right lung and some scant dependent changes in the left lung.  >>>follow  with at least one repeat CT scan in one year with a repeat lung function test.  Multiple pulmonary nodules He had a few scattered small pulmonary nodules. These have been stable on 2 separate images. The radiologist has recommended a repeat CT chest in one year.    Toward end of April 2024 nasal congestion/ muscle aches/sorethroat bad cough clear mucus rx  doxy/omnicef but really didn't turn the corner until prednisone rx    05/08/2023  New Pt eval/ Pulmonary ov /Wert re: COPD 0/ emphysema on and off amiodarone since 05/2020  maint on Trelegy since Feb 2024 from anoro while also back  on amiodarone daily  since 08/04/22  Chief Complaint  Patient presents with   Consult    SOB with exertion x 6 weeks.  Review CT scan 05/01/2023. Concern with taking amiodarone.   Dyspnea:  was walking 2.5 miles per day and stopped and now back to mile and a half and finished prednisone on 05/07/23 p 6 day Cough: loose clear  Sleeping: bed is flat/ one pillow  SABA use: none  02: none    No obvious day to day or daytime variability or assoc excess/ purulent sputum or mucus plugs or hemoptysis or cp or chest tightness, subjective wheeze or overt sinus or hb symptoms.   Sleeping  without nocturnal  or early am exacerbation  of respiratory  c/o's or need for noct saba. Also denies any obvious fluctuation of symptoms with weather or environmental changes or other aggravating or alleviating factors except as outlined above   No unusual exposure hx or h/o childhood pna/ asthma or knowledge of premature birth.         OV 10/08/2023 -transfer of care from Dr. Sandrea Hughs to Dr. Marchelle Gearing in the ILD center.  Referred by Dr. Arvilla Meres and Dr. Buren Kos primary care.  Subjective:  Patient ID: Kyle Bullock, male , DOB: 1951-11-26 , age 72 y.o. , MRN: [id number] , ADDRESS: [address] [place] [address] PCP Cleatis Polka., MD Patient Care Team: Cleatis Polka., MD as PCP - General (Internal Medicine) Lanier Prude, MD as PCP - Electrophysiology (Cardiology)  This Provider for this visit: Treatment Team:  Attending Provider: Kalman Shan, MD    10/08/2023 -  Chief Complaint  Patient presents with   Follow-up    Pt is here for F/U visit. Pt had PFT done 10-08-2023.     HPI Kyle Bullock 72 y.o. -history is provided by the patient, his wife and also review of the external medical record.  He is retired.  He is a wine salesman.  Now worked in [employer].  He is known to have ischemic cardiomyopathy and has a defibrillator.  He sees Dr. Steffanie Dunn for atrial  fibrillation.  He sees Dr. Arvilla Meres for chronic systolic heart failure all his problems started in December 2015 when he suffered VF arrest and status post STEMI with drug-eluting stent to the LAD December 2015.  This was in [place].  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  He has been a patient of Dr. Arvilla Meres per chart review since August 2022.  As of 2022 it was observed he had emphysema and no ILD.  He had seen Dr. Max Fickle in this pulmonary clinic.  His previous history of amiodarone was between June 2021 and October 2021.  Most recently he says that he status post ablation in the fall 2023.  Review of the records confirm this happen for atrial fibrillation on 08/04/2022.  He was then placed on amiodarone.  Then he said getting dyspneic in May 2024 he did have a high-resolution CT chest that showed ILD.  He saw Dr. Sandrea Hughs.  Subsequently placed on prednisone for 6 weeks by Dr. Lalla Brothers.  After the prednisone and discontinuation of the amiodarone he started feeling better but he still not back to his summer 2023 baseline although he is better compared to April 2023.  And his most recent visit with Dr. Arvilla Meres he said he was feeling better and walking to 20 miles per day 3 to 4 days/week.  In July 2024 he did have a cardiopulmonary stress test that showed a VO2 max of 15 mL/kg/min.  Unclear if desaturation test was done.  [In 2018 he had done 19 mL/kg/min] in addition in this 1 there was heavy dead space ventilation.  Review of his imaging shows that in my personal visualization and impression in 2018 all he had was emphysema in 2019 some ILD started showing up in terms of groundglass opacities by 2021 I think this reticulation and by 2022 there is definite ILD.  His most recent 2 scans of May 2024 and September 2024.  Alternative pattern is described by the radiologist.  I personally visualized it and agree with that.  So lower probably T4 UIP/IPF but he does  have associated emphysema.  For his emphysema he is on Trelegy  Current symptom scores as below   And walking desaturation test today he desaturated pretty fast he needed 4 L to correct.  We are discharging him on portable oxygen.   ILD symptom another questionnaire  SYMPTOM SCALE - ILD 10/08/2023  Current weight   O2 use ra  Shortness of Breath 0 -> 5 scale with 5 being worst (score 6 If unable to do)  At rest 1  Simple tasks - showers, clothes change, eating, shaving 1  Household (dishes, doing bed, laundry) 2  Shopping 2  Walking level at own pace 2  Walking up Stairs 2  Total (30-36) Dyspnea Score 10  How bad is your cough? 0  How bad is your fatigue 1  How bad is nausea 0  How bad is vomiting?  0  How bad is diarrhea?  0  How bad is anxiety? 1  How bad is depression 1  Any chronic pain - if so where and how bad 1      Past medical history ILD relevant - History of amiodarone therapy present - Did have COVID in 2022  Family history of lung disease - Mother had COPD - Brother had asthma  Personal exposure history - Smoked heavily between 40 and 2015 1 pack/day. - No marijuana no cocaine no intravenous drug use  Personal exposure history - [place] age of the current home is 46 years.  He is lived there for the 30 years.  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  He used to distribute wine from wholesaler to retailer but he was now exposed to be needed by the grape leaves can have mold  Pulmonary toxicity history - History of amiodarone therapy twice.  Otherwise negative.  Simple office walk 224 (66+46 x 2) feet Pod A at [facility] x  3 laps goal with forehead probe 10/08/2023  10/08/2023   O2 used ra ra  Number laps completed Sit stand x 4 time Half laps  Comments about pace Good pac   Resting Pulse Ox/HR 98% and 72/min 97%    Final Pulse Ox/HR 83% and 98/min 85% in just half la  Desaturated </= 88% TES   Desaturated <= 3% points no   Got Tachycardic >/= 90/min yes   Symptoms at end of test duyspnea Needed 4L [place] to correct  to wal 2 LAps  Miscellaneous comments x         Narrative & Impression  CLINICAL DATA:  72 year old male with history of interstitial lung disease.   EXAM: CT CHEST WITHOUT CONTRAST   TECHNIQUE: Multidetector CT imaging of the chest was performed following the standard protocol without intravenous contrast. High resolution imaging of the lungs, as well as inspiratory and expiratory imaging, was performed.   RADIATION DOSE REDUCTION: This exam was performed according to the departmental dose-optimization program which includes automated exposure control, adjustment of the mA and/or kV according to patient size and/or use of iterative reconstruction technique.   COMPARISON:  High-resolution chest CT 05/01/2023.   FINDINGS: Cardiovascular: Heart size is borderline enlarged. There is no significant pericardial fluid, thickening or pericardial calcification. There is aortic atherosclerosis, as well as atherosclerosis of the great vessels of the mediastinum and the coronary arteries, including calcified atherosclerotic plaque in the left main, left anterior descending, left circumflex and right coronary arteries. Left-sided pacemaker/AICD with lead tip terminating in the right ventricle.   Mediastinum/Nodes: No pathologically enlarged mediastinal or hilar lymph nodes. Please note that accurate exclusion of hilar adenopathy is limited on noncontrast CT scans. Esophagus is unremarkable in appearance. No axillary lymphadenopathy.   Lungs/Pleura: High-resolution images again demonstrate widespread but patchy areas of ground-glass attenuation, septal thickening, subpleural reticulation, cylindrical traction bronchiectasis, peripheral bronchiolectasis and some upper lung  predominant honeycombing which appears minimally progressive compared to the prior examination. Inspiratory and expiratory imaging demonstrates some air trapping indicative of small airways disease. No acute consolidative airspace disease. No pleural effusions. Calcified granuloma in the periphery of the right lower lobe. A few scattered small pulmonary nodules are noted, largest of which is a sessile appearing pleural-based nodule in the posterior right upper lobe (axial image 37 of series 6) measuring 1.4 x 0.6 cm, stable. Other smaller pulmonary nodules are  stable. No other larger more suspicious appearing pulmonary nodules or masses are noted. Diffuse bronchial wall thickening with mild to moderate centrilobular and paraseptal emphysema.   Upper Abdomen: Aortic atherosclerosis.   Musculoskeletal: There are no aggressive appearing lytic or blastic lesions noted in the visualized portions of the skeleton.   IMPRESSION: 1. Fibrotic changes in the lungs once again considered most compatible with an alternative diagnosis (not usual interstitial pneumonia) per current ATS guidelines. Overall, given the presence of air trapping and upper lung predominant honeycombing, findings are once again favored to reflect chronic hypersensitivity pneumonitis. 2. Mild diffuse bronchial wall thickening with mild to moderate centrilobular and paraseptal emphysema; imaging findings suggestive of COPD. 3. Stable pulmonary nodules, likely benign. Continued attention at time of routine annual low-dose lung cancer screening chest CTs is recommended. 4. Aortic atherosclerosis, in addition to left main and three-vessel coronary artery disease. Assessment for potential risk factor modification, dietary therapy or pharmacologic therapy may be warranted, if clinically indicated.   Aortic Atherosclerosis (ICD10-I70.0) and Emphysema (ICD10-J43.9).     Electronically Signed   By: Trudie Reed M.D.    On: 08/27/2023 11:25   PFT     Latest Ref Rng & Units 06/22/2018    1:52 PM 06/14/2015   10:58 AM  PFT Results  FVC-Pre L 3.55  3.33   FVC-Predicted Pre % 66  61   FVC-Post L 3.58    FVC-Predicted Post % 66    Pre FEV1/FVC % % 72  73   Post FEV1/FCV % % 68    FEV1-Pre L 2.55  2.42   FEV1-Predicted Pre % 64  59   FEV1-Post L 2.42    DLCO uncorrected ml/min/mmHg 16.18  15.23   DLCO UNC% % 41  38   DLVA Predicted % 64  61   TLC L 5.55  5.74   TLC % Predicted % 69  71   RV % Predicted % 71  90         LAB RESULTS last 96 hours No results found.  LAB RESULTS last 90 days Recent Results (from the past 2160 hour(s))  Sed Rate (ESR)     Status: None   Collection Time: 07/17/23 10:14 AM  Result Value Ref Range   Sed Rate 12 0 - 16 mm/hr    Comment: Performed at [facility], [address] [place], [place] [address]  Comprehensive Metabolic Panel (CMET)     Status: Abnormal   Collection Time: 07/17/23 10:14 AM  Result Value Ref Range   Sodium 136 135 - 145 mmol/L   Potassium 4.8 3.5 - 5.1 mmol/L   Chloride 101 98 - 111 mmol/L   CO2 25 22 - 32 mmol/L   Glucose, Bld 109 (H) 70 - 99 mg/dL    Comment: Glucose reference range applies only to samples taken after fasting for at least 8 hours.   BUN 16 8 - 23 mg/dL   Creatinine, Ser 5.28 (H) 0.61 - 1.24 mg/dL   Calcium 8.9 8.9 - 41.3 mg/dL   Total Protein 6.3 (L) 6.5 - 8.1 g/dL   Albumin 3.7 3.5 - 5.0 g/dL   AST 20 15 - 41 U/L   ALT 20 0 - 44 U/L   Alkaline Phosphatase 54 38 - 126 U/L   Total Bilirubin 0.6 0.3 - 1.2 mg/dL   GFR, Estimated 55 (L) >60 mL/min    Comment: (NOTE) Calculated using the CKD-EPI Creatinine Equation (2021)    Anion gap  10 5 - 15    Comment: Performed at [facility], [address], [place], [place] [address]  CBC     Status: None   Collection Time: 07/17/23 10:14 AM  Result Value Ref Range   WBC 8.2 4.0 - 10.5 K/uL   RBC 4.49 4.22 - 5.81 MIL/uL   Hemoglobin 14.1 13.0 - 17.0 g/dL    HCT 62.1 30.8 - 65.7 %   MCV 93.1 80.0 - 100.0 fL   MCH 31.4 26.0 - 34.0 pg   MCHC 33.7 30.0 - 36.0 g/dL   RDW 84.6 96.2 - 95.2 %   Platelets 266 150 - 400 K/uL   nRBC 0.0 0.0 - 0.2 %    Comment: Performed at [facility], [address] [place], [place] [address]  CUP PACEART REMOTE DEVICE CHECK     Status: None   Collection Time: 09/01/23  2:00 AM  Result Value Ref Range   Date Time Interrogation Session [phone]    Pulse Generator Manufacturer SJCR    Pulse Gen Model 1357-40Q Fortify Assura VR    Pulse Gen Serial Number [id number]    Clinic Name [facility]    Implantable Pulse Generator Type Implantable Cardiac Defibulator    Implantable Pulse Generator Implant Date [id number]    Implantable Lead Manufacturer [facility]    Implantable Lead Model [phone] Durata SJ4    Implantable Lead Serial Number [id number]    Implantable Lead Implant Date [id number]    Implantable Lead Location Detail 1 SEPTUM    Implantable Lead Location [id number]    Implantable Lead Connection Status [id number]    Lead Channel Setting Sensing Sensitivity 0.5 mV   Lead Channel Setting Sensing Adaptation Mode Adaptive Sensing    Lead Channel Setting Pacing Pulse Width 0.5 ms   Lead Channel Setting Pacing Amplitude 2.5 V   Zone Setting Status Active    Zone Setting Status Active    Zone Setting Status [phone]    Lead Channel Status NULL    Lead Channel Impedance Value 380 ohm   Lead Channel Sensing Intrinsic Amplitude 12.0 mV   Lead Channel Pacing Threshold Amplitude 0.75 V   Lead Channel Pacing Threshold Pulse Width 0.5 ms   HighPow Impedance 74 ohm   HighPow Impedance 74 ohm   HighPow Imped Status NULL    HighPow Imped Status NULL    Battery Status MOS    Battery Remaining Longevity 31 mo   Battery Remaining Percentage 29.0 %   Battery Voltage 2.95 V   Brady Statistic RV Percent Paced 1.0 %  ECHOCARDIOGRAM COMPLETE     Status: None   Collection Time: 09/09/23  1:10 PM  Result Value Ref Range   S'  Lateral 5.10 cm   Area-P 1/2 3.95 cm2   MV M vel 3.42 m/s   MV Peak grad 46.7 mmHg   MV VTI 2.78 cm2   Est EF 25 - 30%   Basic metabolic panel     Status: Abnormal   Collection Time: 09/23/23  9:34 AM  Result Value Ref Range   Glucose 86 70 - 99 mg/dL   BUN 17 8 - 27 mg/dL   Creatinine, Ser 6.06 (H) 0.76 - 1.27 mg/dL   eGFR 53 (L) >30 ZS/WFU/9.32   BUN/Creatinine Ratio 12 10 - 24   Sodium 140 134 - 144 mmol/L   Potassium 4.7 3.5 - 5.2 mmol/L   Chloride 103 96 - 106 mmol/L   CO2 23 20 -  29 mmol/L   Calcium 9.4 8.6 - 10.2 mg/dL  Magnesium     Status: None   Collection Time: 09/23/23  9:34 AM  Result Value Ref Range   Magnesium 1.9 1.6 - 2.3 mg/dL  CUP PACEART INCLINIC DEVICE CHECK     Status: None   Collection Time: 09/23/23  9:47 AM  Result Value Ref Range   Date Time Interrogation Session [phone]    Pulse Generator Manufacturer SJCR    Pulse Gen Model 1357-40Q Fortify Assura VR    Pulse Gen Serial Number [id number]    Clinic Name [facility]    Implantable Pulse Generator Type Implantable Cardiac Defibulator    Implantable Pulse Generator Implant Date [id number]    Implantable Lead Manufacturer [facility]    Implantable Lead Model [phone] Durata SJ4    Implantable Lead Serial Number [id number]    Implantable Lead Implant Date 25366440    Implantable Lead Location Detail 1 SEPTUM    Implantable Lead Location [id number]    Implantable Lead Connection Status [id number]    Lead Channel Setting Sensing Sensitivity 0.5 mV   Lead Channel Setting Pacing Pulse Width 0.5 ms   Lead Channel Setting Pacing Amplitude 2.5 V   Zone Setting Status Active    Zone Setting Status Active    Zone Setting Status [phone]    Lead Channel Impedance Value 362.5 ohm   Lead Channel Sensing Intrinsic Amplitude 12.0 mV   Lead Channel Pacing Threshold Amplitude 0.75 V   Lead Channel Pacing Threshold Pulse Width 0.5 ms   Lead Channel Pacing Threshold Amplitude 0.75 V   Lead Channel Pacing Threshold Pulse  Width 0.5 ms   HighPow Impedance 65.25 ohm   Battery Remaining Longevity 31 mo   Brady Statistic RV Percent Paced 0.27 %  CBC w/Diff     Status: Abnormal   Collection Time: 10/08/23  2:27 PM  Result Value Ref Range   WBC 8.7 4.0 - 10.5 K/uL   RBC 4.47 4.22 - 5.81 Mil/uL   Hemoglobin 14.0 13.0 - 17.0 g/dL   HCT 95.6 38.7 - 56.4 %   MCV 96.2 78.0 - 100.0 fl   MCHC 32.6 30.0 - 36.0 g/dL   RDW 33.2 95.1 - 88.4 %   Platelets 291.0 150.0 - 400.0 K/uL   Neutrophils Relative % 77.8 (H) 43.0 - 77.0 %   Lymphocytes Relative 10.8 (L) 12.0 - 46.0 %   Monocytes Relative 9.1 3.0 - 12.0 %   Eosinophils Relative 1.4 0.0 - 5.0 %   Basophils Relative 0.9 0.0 - 3.0 %   Neutro Abs 6.8 1.4 - 7.7 K/uL   Lymphs Abs 0.9 0.7 - 4.0 K/uL   Monocytes Absolute 0.8 0.1 - 1.0 K/uL   Eosinophils Absolute 0.1 0.0 - 0.7 K/uL   Basophils Absolute 0.1 0.0 - 0.1 K/uL  Sed Rate (ESR)     Status: None   Collection Time: 10/08/23  2:27 PM  Result Value Ref Range   Sed Rate 20 0 - 20 mm/hr         has a past medical history of Abnormal TSH, AICD (automatic cardioverter/defibrillator) present (02/28/2015), CAD (coronary artery disease), CHF (congestive heart failure) (HCC), Dressler's syndrome (HCC), Dyslipidemia, Dyspnea, Erectile dysfunction, Hypertension, Ischemic cardiomyopathy, NSVT (nonsustained ventricular tachycardia) (HCC) (11/16/14), Pericardial effusion, Pericarditis (dx'd 11/2014), Pneumonia (1990's X 2), RBBB, Sleep apnea, STEMI (ST elevation myocardial infarction) (HCC) (11/10/2014), Tobacco abuse, and VF (ventricular fibrillation) (HCC) (11/10/14).   reports that he quit smoking about 8 years  ago. His smoking use included cigarettes. He started smoking about 48 years ago. He has a 80 pack-year smoking history. He has never used smokeless tobacco.  Past Surgical History:  Procedure Laterality Date   A-FLUTTER ABLATION N/A 09/04/2020   Procedure: A-FLUTTER ABLATION;  Surgeon: Hillis Range, MD;  Location:  [facility];  Service: Cardiovascular;  Laterality: N/A;   ATRIAL FIBRILLATION ABLATION N/A 08/04/2022   Procedure: ATRIAL FIBRILLATION ABLATION;  Surgeon: Lanier Prude, MD;  Location: [facility];  Service: Cardiovascular;  Laterality: N/A;   CARDIAC CATHETERIZATION  11/19/2014   CARDIAC DEFIBRILLATOR PLACEMENT  02/28/2015   COLONOSCOPY WITH PROPOFOL N/A 03/16/2023   Procedure: COLONOSCOPY WITH PROPOFOL;  Surgeon: Imogene Burn, MD;  Location: [facility];  Service: Gastroenterology;  Laterality: N/A;   CORONARY ANGIOPLASTY WITH STENT PLACEMENT  11/10/2014   LAD DES   HAND SURGERY Bilateral    Dupuytren's Contracture   IMPLANTABLE CARDIOVERTER DEFIBRILLATOR IMPLANT N/A 02/28/2015   SJM Fortify Assura VR ICD implanted by Dr Johney Frame.  Revascularized following VF arrest, EF remained depressed and ICD implanted   KNEE ARTHROSCOPY Right 12/08/1980   LEFT HEART CATH N/A 11/19/2014   Procedure: LEFT HEART CATH;  Surgeon: Runell Gess, MD;  Location: [facility] CATH LAB;  Service: Cardiovascular;  Laterality: N/A;   POLYPECTOMY  03/16/2023   Procedure: POLYPECTOMY;  Surgeon: Imogene Burn, MD;  Location: [facility];  Service: Gastroenterology;;   RIGHT/LEFT HEART CATH AND CORONARY ANGIOGRAPHY N/A 07/22/2021   Procedure: RIGHT/LEFT HEART CATH AND CORONARY ANGIOGRAPHY;  Surgeon: Dolores Patty, MD;  Location: [facility];  Service: Cardiovascular;  Laterality: N/A;    Allergies  Allergen Reactions   Ambien [Zolpidem Tartrate] Anxiety and Other (See Comments)    Causes nightmares   Lisinopril Cough   Brilinta [Ticagrelor] Cough    ? Dyspnea. Changed to Effient 11/2014.    Immunization History  Administered Date(s) Administered   DTaP, 5 pertussis antigens 12/14/2015   Influenza, High Dose Seasonal PF 09/16/2015, 08/30/2017   Influenza, Quadrivalent, Recombinant, Inj, Pf 09/04/2018, 08/27/2019, 09/08/2020, 09/14/2021   Influenza-Unspecified 08/17/2014, 09/20/2016    PFIZER(Purple Top)SARS-COV-2 Vaccination 12/28/2019, 01/16/2020   Pneumococcal Conjugate-13 07/26/2015   Pneumococcal Polysaccharide-23 11/13/2014, 12/18/2016   Zoster Recombinant(Shingrix) 07/02/2019, 11/10/2019    Family History  Problem Relation Age of Onset   COPD Mother    Hypertension Mother    Heart disease Mother    Hypertension Father    Prostate cancer Father    Crohn's disease Brother    CAD Neg Hx    Colon cancer Neg Hx    Stomach cancer Neg Hx    Esophageal cancer Neg Hx      Current Outpatient Medications:    acetaminophen (TYLENOL) 500 MG tablet, Take 500 mg by mouth every 8 (eight) hours as needed for moderate pain., Disp: , Rfl:    atorvastatin (LIPITOR) 20 MG tablet, TAKE 1 TABLET BY MOUTH DAILY, Disp: 90 tablet, Rfl: 3   cyanocobalamin (VITAMIN B12) 1000 MCG tablet, Take 1,000 mcg by mouth daily., Disp: , Rfl:    ELIQUIS 5 MG TABS tablet, TAKE 1 TABLET(5 MG) BY MOUTH TWICE DAILY, Disp: 60 tablet, Rfl: 5   ENTRESTO 24-26 MG, TAKE 1 TABLET BY MOUTH TWICE DAILY, Disp: 180 tablet, Rfl: 3   furosemide (LASIX) 20 MG tablet, TAKE 1 TABLET BY MOUTH DAILY AS NEEDED, Disp: 30 tablet, Rfl: 2   levothyroxine (SYNTHROID) 112 MCG tablet, Take 112 mcg by mouth daily  before breakfast., Disp: , Rfl:    metoprolol succinate (TOPROL-XL) 25 MG 24 hr tablet, Take 1 tablet (25 mg total) by mouth at bedtime., Disp: 90 tablet, Rfl: 3   nitroGLYCERIN (NITROSTAT) 0.4 MG SL tablet, PLACE 1 TABLET UNDER THE TONGUE EVERY 5 MINUTES AS NEEDED FOR CHEST PAIN, Disp: 25 tablet, Rfl: 3   omega-3 acid ethyl esters (LOVAZA) 1 g capsule, Take 2 g by mouth 2 (two) times daily., Disp: , Rfl:    Probiotic Product (PROBIOTIC PO), Take 1 tablet by mouth every other day., Disp: , Rfl:    spironolactone (ALDACTONE) 25 MG tablet, Take 1 tablet (25 mg total) by mouth daily., Disp: 90 tablet, Rfl: 3   TRELEGY ELLIPTA 100-62.5-25 MCG/ACT AEPB, Take 1 puff by mouth daily., Disp: , Rfl:       Objective:    Vitals:   10/08/23 1309  BP: 90/60  Pulse: 77  SpO2: 95%  Weight: 212 lb 3.2 oz (96.3 kg)  Height: 6\' 3"  (1.905 m)    Estimated body mass index is 26.52 kg/m as calculated from the following:   Height as of this encounter: 6\' 3"  (1.905 m).   Weight as of this encounter: 212 lb 3.2 oz (96.3 kg).  @WEIGHTCHANGE @  [employer]   10/08/23 1309  Weight: 212 lb 3.2 oz (96.3 kg)     Physical Exam   General: No distress. Looks well O2 at rest: no Cane present: no Sitting in wheel chair: no Frail: no Obese: no Neuro: Alert and Oriented x 3. GCS 15. Speech normal Psych: Pleasant Resp:  Barrel Chest - no.  Wheeze - no, Crackles - no, No overt respiratory distress CVS: Normal heart sounds. Murmurs - no Ext: Stigmata of Connective Tissue Disease - no HEENT: Normal upper airway. PEERL +. No post nasal drip        Assessment:       ICD-10-CM   1. ILD (interstitial lung disease) (HCC)  J84.9 CBC w/Diff    Sed Rate (ESR)    Rheumatoid Factor    Cyclic citrul peptide antibody, IgG    QuantiFERON-TB Gold Plus    ANA+ENA+DNA/DS+Scl 70+SjoSSA/B    Hypersensitivity Pneumonitis    Ambulatory Referral for DME    Hypersensitivity Pneumonitis    CBC w/Diff    Sed Rate (ESR)    Rheumatoid Factor    Cyclic citrul peptide antibody, IgG    ANA+ENA+DNA/DS+Scl 70+SjoSSA/B    QuantiFERON-TB Gold Plus    2. Centrilobular emphysema (HCC)  J43.2     3. DOE (dyspnea on exertion)  R06.09     4. 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 Exercise hypoxemia  R09.02     6. Chronic respiratory failure with hypoxia (HCC)  J96.11 Ambulatory Referral for DME         Plan:     Patient Instructions     ICD-10-CM   1. ILD (interstitial lung disease) (HCC)  J84.9 CBC w/Diff    Sed Rate (ESR)    Rheumatoid Factor    Cyclic citrul peptide antibody, IgG    QuantiFERON-TB Gold Plus    ANA+ENA+DNA/DS+Scl 70+SjoSSA/B    Hypersensitivity  Pneumonitis    Ambulatory Referral for DME    Hypersensitivity Pneumonitis    CBC w/Diff    Sed Rate (ESR)    Rheumatoid Factor    Cyclic citrul peptide antibody, IgG    ANA+ENA+DNA/DS+Scl 70+SjoSSA/B    QuantiFERON-TB Gold  Plus    2. Centrilobular emphysema (HCC)  J43.2     3. DOE (dyspnea on exertion)  R06.09     4. 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 Exercise hypoxemia  R09.02     6. Chronic respiratory failure with hypoxia (HCC)  J96.11 Ambulatory Referral for DME       YOu have emphysema but you also have fibrosis Fibrosis did not seem to be there in 2018 but started in 2019 and progressive mildly since then Cause not known but amiodarone 2021 and 2023  and covid 2022 might have helped it a bit getting worse (maybe not)  Plan  - continue trelegy - check serology blood work  - check Hypersensitivity Penumonitis panel blood work  - will discuss in case conference - will review ILD quetsionnaire - start 4L [place] portable  - check oNO on room air - stay off amiodarone -repeat spirometry and dlco in 6-8 weeks to capture any tendency to get worse fast  Followup  - return in 8 weeks to discuss next steps - ILD clinic  - consider anti-fibrotic    FOLLOWUP Return in about 7 weeks (around 11/26/2023) for ILD, Face to Face Visit, with Dr Marchelle Gearing, 30 min visit.  - Will try to see him sooner -Email to Pietro Cassis to get him on the conference.   ( Level 05 visit E&M 2024: Estb >= 40 min  in  visit type: on-site physical face to visit  in total care time and counseling or/and coordination of care by this undersigned MD - Dr Kalman Shan. This includes one or more of the following on this same day 10/08/2023: pre-charting, chart review, note writing, documentation discussion of test results, diagnostic or treatment recommendations, prognosis, risks and benefits of management options, instructions, education, compliance or  risk-factor reduction. It excludes time spent by the CMA or office staff in the care of the patient. Actual time 50 min)   SIGNATURE    Dr. Kalman Shan, M.D., F.C.C.P,  Pulmonary and Critical Care Medicine Staff Physician, [facility] Director - Interstitial Lung Disease  Program  Pulmonary Fibrosis [facility] at [facility] [place], [place], [address]  Pager: [phone], If no answer or between  15:00h - 7:00h: call [phone] Telephone: [phone]  6:42 PM 10/08/2023

## 2023-10-09 ENCOUNTER — Telehealth: Payer: Self-pay | Admitting: Internal Medicine

## 2023-10-09 DIAGNOSIS — R0609 Other forms of dyspnea: Secondary | ICD-10-CM

## 2023-10-09 DIAGNOSIS — J849 Interstitial pulmonary disease, unspecified: Secondary | ICD-10-CM

## 2023-10-09 DIAGNOSIS — J432 Centrilobular emphysema: Secondary | ICD-10-CM

## 2023-10-09 NOTE — Telephone Encounter (Signed)
Zott, Penni Bombard, [place] S; Zott, [place]; Maxwell, Tammy HI Anson, I contacted the patient yesterday to let him know we had the order and was working on it. He told me he had discussed with your office that he wanted to be retested as there were some issues with his testing and he did not want oxygen until that retesting was done, he is also going away for the weekend and wants to wait until he is back to go forward with anything.  Thank You

## 2023-10-10 LAB — QUANTIFERON-TB GOLD PLUS
Mitogen-NIL: 6.54 [IU]/mL
NIL: 0.03 [IU]/mL
QuantiFERON-TB Gold Plus: NEGATIVE
TB1-NIL: 0.01 [IU]/mL
TB2-NIL: 0.01 [IU]/mL

## 2023-10-10 LAB — RHEUMATOID FACTOR: Rheumatoid fact SerPl-aCnc: <10 [IU]/mL (ref <14)

## 2023-10-10 LAB — CYCLIC CITRUL PEPTIDE ANTIBODY, IGG: Cyclic Citrullin Peptide Ab: <16 U

## 2023-10-12 NOTE — Telephone Encounter (Signed)
I think there was an issue with the probe and that is why he is concerned.  This is fine.  I think I ordered overnight pulse oximetry lets get that and then when he comes back to the office will retest his exertional pulse ox or you can schedule him for a walking desaturation test/oxygen qualifying test as a nurse visit.  The latter is preferred if you have the resources

## 2023-10-13 LAB — HYPERSENSITIVITY PNEUMONITIS
A. Pullulans Abs: NEGATIVE
A.Fumigatus #1 Abs: NEGATIVE
Micropolyspora faeni, IgG: NEGATIVE
Pigeon Serum Abs: NEGATIVE
Thermoact. Saccharii: NEGATIVE
Thermoactinomyces vulgaris, IgG: NEGATIVE

## 2023-10-13 LAB — ANA+ENA+DNA/DS+SCL 70+SJOSSA/B
ANA Titer 1: NEGATIVE
ENA RNP Ab: <0.2 AI (ref 0.0–0.9)
ENA SM Ab Ser-aCnc: <0.2 AI (ref 0.0–0.9)
ENA SSA (RO) Ab: <0.2 AI (ref 0.0–0.9)
ENA SSB (LA) Ab: <0.2 AI (ref 0.0–0.9)
Scleroderma (Scl-70) (ENA) Antibody, IgG: <0.2 AI (ref 0.0–0.9)
dsDNA Ab: <1 [IU]/mL (ref 0–9)

## 2023-10-13 NOTE — Telephone Encounter (Signed)
Lm x1 for patient.  

## 2023-10-13 NOTE — Telephone Encounter (Signed)
Patient is trying to return missed call.

## 2023-10-14 ENCOUNTER — Other Ambulatory Visit (HOSPITAL_COMMUNITY): Payer: Self-pay

## 2023-10-14 DIAGNOSIS — I5022 Chronic systolic (congestive) heart failure: Secondary | ICD-10-CM

## 2023-10-14 DIAGNOSIS — I10 Essential (primary) hypertension: Secondary | ICD-10-CM

## 2023-10-14 MED ORDER — SPIRONOLACTONE 25 MG PO TABS
25.0000 mg | ORAL_TABLET | Freq: Every day | ORAL | 3 refills | Status: DC
Start: 2023-10-14 — End: 2023-10-20

## 2023-10-16 LAB — PULMONARY FUNCTION TEST
DL/VA % pred: 72 %
DL/VA: 2.84 ml/min/mmHg/L
DLCO cor % pred: 40 %
DLCO cor: 11.94 ml/min/mmHg
DLCO unc % pred: 40 %
DLCO unc: 11.94 ml/min/mmHg
FEF 25-75 Post: 2.52 L/s
FEF 25-75 Pre: 2.25 L/s
FEF2575-%Change-Post: 11 %
FEF2575-%Pred-Post: 89 %
FEF2575-%Pred-Pre: 80 %
FEV1-%Change-Post: 3 %
FEV1-%Pred-Post: 66 %
FEV1-%Pred-Pre: 64 %
FEV1-Post: 2.52 L
FEV1-Pre: 2.43 L
FEV1FVC-%Change-Post: 2 %
FEV1FVC-%Pred-Pre: 105 %
FEV6-%Change-Post: 0 %
FEV6-%Pred-Post: 64 %
FEV6-%Pred-Pre: 64 %
FEV6-Post: 3.17 L
FEV6-Pre: 3.15 L
FEV6FVC-%Change-Post: 0 %
FEV6FVC-%Pred-Post: 105 %
FEV6FVC-%Pred-Pre: 105 %
FVC-%Change-Post: 0 %
FVC-%Pred-Post: 61 %
FVC-%Pred-Pre: 60 %
FVC-Post: 3.18 L
FVC-Pre: 3.15 L
Post FEV1/FVC ratio: 79 %
Post FEV6/FVC ratio: 100 %
Pre FEV1/FVC ratio: 77 %
Pre FEV6/FVC Ratio: 100 %
RV % pred: 43 %
RV: 1.2 L
TLC % pred: 52 %
TLC: 4.21 L

## 2023-10-20 ENCOUNTER — Ambulatory Visit: Payer: Self-pay | Admitting: Internal Medicine

## 2023-10-20 MED ORDER — SPIRONOLACTONE 25 MG PO TABS: 25.0000 mg | ORAL_TABLET | Freq: Every day | ORAL | 11 refills | Status: DC

## 2023-10-20 NOTE — Addendum Note (Signed)
Addended by: Mal Asher, Milagros Reap on: 10/20/2023 11:23 AM   Modules accepted: Orders

## 2023-10-22 ENCOUNTER — Other Ambulatory Visit (HOSPITAL_COMMUNITY): Payer: Self-pay | Admitting: Internal Medicine

## 2023-10-22 NOTE — Progress Notes (Cosign Needed)
Interstitial Lung Disease Multidisciplinary Conference   Kyle Bullock    MRN [id number]    DOB 11-Feb-1951  Primary Care Physician:Shaw, Netta Corrigan., MD  Referring Physician: Dr. Marchelle Gearing  Time of Conference: 7.00am- 8.00am Date of conference: 10/20/2023 Location of Conference: -  Virtual  Participating Pulmonary: Dr. Kalman Shan, Dr Chilton Greathouse Pathology: - Radiology: Dr Allegra Lai Others: n/a  Brief History: Get annual LDCT. Amio in 2021, Covid 2022 . Amio in 2023 then got worse. Now needing 4L [place] with exertion.   When did ILD start? What type? Progressive?   PFT    Latest Ref Rng & Units 10/08/2023   10:48 AM 06/22/2018    1:52 PM 06/14/2015   10:58 AM  PFT Results  FVC-Pre L 3.15  3.55  3.33   FVC-Predicted Pre % 60  66  61   FVC-Post L 3.18  3.58    FVC-Predicted Post % 61  66    Pre FEV1/FVC % % 77  72  73   Post FEV1/FCV % % 79  68    FEV1-Pre L 2.43  2.55  2.42   FEV1-Predicted Pre % 64  64  59   FEV1-Post L 2.52  2.42    DLCO uncorrected ml/min/mmHg 11.94  16.18  15.23   DLCO UNC% % 40  41  38   DLCO corrected ml/min/mmHg 11.94     DLCO COR %Predicted % 40     DLVA Predicted % 72  64  61   TLC L 4.21  5.55  5.74   TLC % Predicted % 52  69  71   RV % Predicted % 43  71  90       MDD discussion of CT scan    - Date or time period of scan:  HRCT: 08/17/2023 HRCT: 05/01/2023 LDCT: 09/01/2022 LDCT: 07/18/2021 LDCT: 02/06/2020 HRCT: 06/14/2015  - Discussion synopsis:  In 2016: just mild emphysema with very subtle reticulations in lower lobe. In  May 2024: subpleural predmoniant but also reticulation with emphysema in Upper Lobe. Can be smoking related ILD or HP. Mild Air Trapping. In 2024 - having subpleural reticulations in lower lobe.   - What is the final conclusion per 2018 ATS/Fleischner Criteria - Combined SRIF with emphysema in UL and Probable UIP in lower lobe.   - Concordance with official report: Not commented  upon  Pathology discussion of biopsy: n/a    MDD Impression/Recs: 2016 -> 2018 similar Progressing slowly since 2016 for sure but progression started only since 2023 (post covid and amio). PFTs are more CFPFE. Consider anti-fibrotics strongly. Consider biopsy risk before recommending biopsy   Time Spent in preparation and discussion:  > 30 min    SIGNATURE   Dr. Kalman Shan, M.D., F.C.C.P,  Pulmonary and Critical Care Medicine Staff Physician, [facility] Center Director - Interstitial Lung Disease  Program  Pulmonary Fibrosis [facility] at [facility] [place], [place], [address]  Pager: [phone], If no answer or between  15:00h - 7:00h: call [phone] Telephone: [phone]  9:51 AM 10/22/2023 ...................................................................................................................Marland Kitchen References: Diagnosis of Hypersensitivity Pneumonitis in Adults. An Official ATS/JRS/ALAT Clinical Practice Guideline. Ragu G et al, Am J Respir Crit Care Med. 2020 Aug 1;202(3):e36-e69.       Diagnosis of Idiopathic Pulmonary Fibrosis. An Official ATS/ERS/JRS/ALAT Clinical Practice Guideline. Raghu G et al, Am J Respir Crit Care Med. 2018 Sep 1;198(5):e44-e68.   IPF Suspected  Histopath ology Pattern      UIP  Probable UIP  Indeterminate for  UIP  Alternative  diagnosis    UIP  IPF  IPF  IPF  Non-IPF dx   HRCT   Probabe UIP  IPF  IPF  IPF (Likely)**  Non-IPF dx  Pattern  Indeterminate for UIP  IPF  IPF (Likely)**  Indeterminate  for IPF**  Non-IPF dx    Alternative diagnosis  IPF (Likely)**/ non-IPF dx  Non-IPF dx  Non-IPF dx  Non-IPF dx     Idiopathic pulmonary fibrosis diagnosis based upon HRCT and Biopsy paterns.  ** IPF is the likely diagnosis when any of following features are present:  Moderate-to-severe traction bronchiectasis/bronchiolectasis (defined as mild traction  bronchiectasis/bronchiolectasis in four or more lobes including the lingual as a lobe, or moderate to severe traction bronchiectasis in two or more lobes) in a man over age 47 years or in a woman over age 55 years Extensive (>30%) reticulation on HRCT and an age >70 years  Increased neutrophils and/or absence of lymphocytosis in BAL fluid  Multidisciplinary discussion reaches a confident diagnosis of IPF.   **Indeterminate for IPF  Without an adequate biopsy is unlikely to be IPF  With an adequate biopsy may be reclassified to a more specific diagnosis after multidisciplinary discussion and/or additional consultation.   dx = diagnosis; HRCT = high-resolution computed tomography; IPF = idiopathic pulmonary fibrosis; UIP = usual interstitial pneumonia.

## 2023-10-23 NOTE — Telephone Encounter (Signed)
Called and spoke with pt. ONO has been ordered for pt to repeat testing. Pt verbalized understanding, nfn

## 2023-11-02 ENCOUNTER — Other Ambulatory Visit: Payer: Self-pay

## 2023-11-02 MED ORDER — METOPROLOL SUCCINATE ER 25 MG PO TB24: 25.0000 mg | ORAL_TABLET | Freq: Every day | ORAL | 3 refills | Status: AC

## 2023-11-10 DIAGNOSIS — G4730 Sleep apnea, unspecified: Secondary | ICD-10-CM | POA: Diagnosis not present

## 2023-11-10 DIAGNOSIS — R0902 Hypoxemia: Secondary | ICD-10-CM | POA: Diagnosis not present

## 2023-11-11 NOTE — Progress Notes (Signed)
Patient ID: Kyle Bullock, male   DOB: [birth year], 72 y.o.   MRN: [id number]    Advanced Heart Failure Clinic Note     PCP: Cleatis Polka., MD HF Cardiologist: Dr. Gala Romney  HPI: Kyle Bullock is a 72 y.o. male who is referred to the HF Clinic by Dr. Excell Seltzer. He has h/o CAD and ischemic cardiomyopathy 20-25%.  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    In December 2015, he suffered an acute anterolateral MI complicated by VF arrest in [place]. He underwent drug-eluting stent placement to the LAD and PTCA of the diagonal. EF was 25% and a life vest was placed prior to discharge. Following return to [place], he developed dyspnea and presented to [facility] where he was readmitted. His Brilinta was switched to Effient as it was felt the Brilinta may have been playing a role in his dyspnea. He was also switched from an ACE inhibitor to an ARB. He was seen by electrophysiology with recommendation for ongoing life vest therapy and follow-up echo in 90 days post revascularization. He was then seen in clinic in mid December where he reported pleuritic chest pain. He was readmitted and underwent diagnostic catheterization revealing patency of previously placed stent and area of balloon angioplasty in the diagonal. EF was 20-25% with diffuse hypokinesis and normal pericardial appearance. He did have some hypotension requiring discontinuation of ARB therapy. His beta blocker was also down titrated. He was diagnosed with Dressler's syndrome and was placed on prednisone taper and colchicine.  CT with COPD. Referred to Dr. Kendrick Fries who felt he had mild COPD but no ILD. Given PRN albuterol. Has repeat CT scheduled for 1 year.   Had ATP in 7/22. Found on device interrogation. Seen by EP. According to EP device showed initially irregular arrhythmia concerning for Afib. He then developed a regular tachy arrhythmia (likely VT) which was terminated with ATP. After ATP, his rhythm was  again regular. Zio placed showed SVT with some ectopy. No VT or AF.   Cath in 8/22 for CP with stable CAD.   Had ICD shock 12/29 while he was stacking wood and carrying large loads, when he received an ICD shock. Seen by EP. Felt to have AF/AFL possibly leading to VT. Zio placed showing 5% AF/AFL. Started on amio. Eliquis resumed.   Echo 1/23 EF 25-30%   Had AF ablation 08/04/22.   In May 2024. Hi-res CT: + ILD. Saw Dr. Sherene Sires and Evie Lacks stopped. Started prednisone  Follow up in APP clinic 6/24. CPX ordered. Was on prednisone 40 x 2 weeks, 20 x 2 weeks and 10 x 2 weeks  - CPX 07/06/23 FVC 3.02 (62%)      FEV1 2.41 (66%)        FEV1/FVC 80 (106%)        MVV 119 (84%)   BP rest: 94/60 Standing BP: 90/62 BP peak: 132/60  Peak VO2: 15.0 (57% predicted peak VO2)  VE/VCO2 slope:  44  OUES: 1.63  Peak RER: 1.12   Follow up 8/24, stable NYHA II-III. Plan to lose 10 lbs, then re-evaluate for CCM vs barostim. (Not currently adv therapy candidate). Sed rate ok, Hi-Res lung CT showed stable lung scarring and stable nodules.  Echo 10/24: EF 25-30%.   Follow up with Pulmonary 10/24, working up for hypersensitivity pneumonitis. Walking O2 sat dropped to 82%, arranged for portable oxygen. However, ? Accuracy of probe. ONO showed desat < 88% and recommended 2L  portable O2.  Today he returns for HF follow up with his wife. Overall feeling fine. Working out in the yard without dyspnea, not exercising much now. Carried firewood x 3 hours this weekend without issue, took erst breaks. Fatigue has improved. Denies palpitations, abnormal bleeding, CP, dizziness, edema, or PND/Orthopnea. Appetite ok. No fever or chills. Weight at home 208 pounds. Taking all medications. Frustrated that he does not have "answer" for on-going dyspnea.   Cardiac studies:  - Echo 10/24: EF 25-30%  - CPX 07/06/23 FVC 3.02 (62%)      FEV1 2.41 (66%)        FEV1/FVC 80 (106%)        MVV 119 (84%)   BP rest: 94/60  Standing BP: 90/62 BP peak: 132/60  Peak VO2: 15.0 (57% predicted peak VO2)  VE/VCO2 slope:  44  OUES: 1.63  Peak RER: 1.12   - Cath 8/22   Prox RCA to Mid RCA lesion is 55% stenosed.   1st Mrg lesion is 30% stenosed.   Ost LAD to Prox LAD lesion is 30% stenosed.   Previously placed Prox LAD to Dist LAD stent (unknown type) is  widely patent.   Findings: Ao = 104/57 (77)  LV = 107/7 RA =  3 RV = 23/1 PA = 21/4 (10) PCW = 3 Fick cardiac output/index = 6.0/2.7 PVR = 1.2 WU Ao sat = 99% PA sat = 74%, 74% Assessment: CAD with widely patent LAD stent Non-obstructive CAD in RCA and LCX with ~ 60% lesion throughout [facility] ICM with EF 25% Low filling pressures with preserved CO  - Echo 3/16 EF 25-30%  - Echo 03/2016 EF 20-25%  - Echo 9/18 EF 20-25% RV normal   - CPX 2/18 FVC 3.49 (72%)      FEV1 2.49 (68%)        FEV1/FVC 71 (93%)        MVV 117 (82%) Resting HR: 62 Peak HR: 148   (97% age predicted max HR) BP rest: 84/60 BP peak: 156/62 Peak VO2: 19.6 (69% predicted peak VO2) VE/VCO2 slope:  37 OUES: 2.14 Peak RER: 1.06 Ventilatory Threshold: 17.4 (61% predicted or measured peak VO2) VE/MVV:  66% O2pulse:  12   (71% predicted O2pulse)  CPX 05/14/2015  Peak VO2: 19.0 (64.5% predicted peak VO2) VE/VCO2 slope:  41.5 OUES: 2.01 Peak RER: 1.06 Ventilatory Threshold: 14.1  (47.9% predicted or measured peak VO2)   Past Medical History:  Diagnosis Date   Abnormal TSH    a. 11/2014: felt d/t sick euthyroid.   AICD (automatic cardioverter/defibrillator) present 02/28/2015   CAD (coronary artery disease)    a. 11/2014: arrest/STEMI s/p Xience DES to the LAD and PTCA to the D1 [place], [place]). b. Relook cath 11/2014: no culprit, patent stent.   CHF (congestive heart failure) (HCC)    Dressler's syndrome (HCC)    a. 11/2014: tx with colchicine and prednisone.   Dyslipidemia    Dyspnea    a. 11/2014: possibly due to combo of CHF and Brilinta. Brilinta changed to Effient.    Erectile dysfunction    a. Pt aware not to take ED med within 24 hr of NTG and vice versa.   Hypertension    Ischemic cardiomyopathy    a. 11/2014: EF reportedly 20% by cath and echo in [place].   NSVT (nonsustained ventricular tachycardia) (HCC) 11/16/14   a. 11/2014: admitted after discharge from STEMI admission, 18 beats 135-140bpm in ED.   Pericardial effusion  a. 11/2014.   Pericarditis dx'd 11/2014   Pneumonia 1990's X 2   RBBB    Sleep apnea    suspected but not diagnosed (02/28/2015)   STEMI (ST elevation myocardial infarction) (HCC) 11/10/2014   Tobacco abuse    VF (ventricular fibrillation) (HCC) 11/10/14   a. 11/2014: arrest with STEMI.   Past Surgical History:  Procedure Laterality Date   A-FLUTTER ABLATION N/A 09/04/2020   Procedure: A-FLUTTER ABLATION;  Surgeon: Hillis Range, MD;  Location: [facility] INVASIVE CV LAB;  Service: Cardiovascular;  Laterality: N/A;   ATRIAL FIBRILLATION ABLATION N/A 08/04/2022   Procedure: ATRIAL FIBRILLATION ABLATION;  Surgeon: Lanier Prude, MD;  Location: [facility] INVASIVE CV LAB;  Service: Cardiovascular;  Laterality: N/A;   CARDIAC CATHETERIZATION  11/19/2014   CARDIAC DEFIBRILLATOR PLACEMENT  02/28/2015   COLONOSCOPY WITH PROPOFOL N/A 03/16/2023   Procedure: COLONOSCOPY WITH PROPOFOL;  Surgeon: Imogene Burn, MD;  Location: [facility] ENDOSCOPY;  Service: Gastroenterology;  Laterality: N/A;   CORONARY ANGIOPLASTY WITH STENT PLACEMENT  11/10/2014   LAD DES   HAND SURGERY Bilateral    Dupuytren's Contracture   IMPLANTABLE CARDIOVERTER DEFIBRILLATOR IMPLANT N/A 02/28/2015   SJM Fortify Assura VR ICD implanted by Dr Johney Frame.  Revascularized following VF arrest, EF remained depressed and ICD implanted   KNEE ARTHROSCOPY Right 12/08/1980   LEFT HEART CATH N/A 11/19/2014   Procedure: LEFT HEART CATH;  Surgeon: Runell Gess, MD;  [facility]: [facility] CATH LAB;  Service: Cardiovascular;  Laterality: N/A;   POLYPECTOMY  03/16/2023   Procedure: POLYPECTOMY;   Surgeon: Imogene Burn, MD;  Location: [facility] ENDOSCOPY;  Service: Gastroenterology;;   RIGHT/LEFT HEART CATH AND CORONARY ANGIOGRAPHY N/A 07/22/2021   Procedure: RIGHT/LEFT HEART CATH AND CORONARY ANGIOGRAPHY;  Surgeon: Dolores Patty, MD;  Location: [facility] INVASIVE CV LAB;  Service: Cardiovascular;  Laterality: N/A;   Current Outpatient Medications  Medication Sig Dispense Refill   acetaminophen (TYLENOL) 500 MG tablet Take 500 mg by mouth every 8 (eight) hours as needed for moderate pain.     atorvastatin (LIPITOR) 20 MG tablet TAKE 1 TABLET BY MOUTH DAILY 90 tablet 3   cyanocobalamin (VITAMIN B12) 1000 MCG tablet Take 1,000 mcg by mouth daily.     ELIQUIS 5 MG TABS tablet TAKE 1 TABLET(5 MG) BY MOUTH TWICE DAILY 60 tablet 5   ENTRESTO 24-26 MG TAKE 1 TABLET BY MOUTH TWICE DAILY 180 tablet 3   furosemide (LASIX) 20 MG tablet TAKE 1 TABLET BY MOUTH DAILY AS NEEDED 30 tablet 2   levothyroxine (SYNTHROID) 112 MCG tablet Take 112 mcg by mouth daily before breakfast.     metoprolol succinate (TOPROL-XL) 25 MG 24 hr tablet Take 1 tablet (25 mg total) by mouth at bedtime. 90 tablet 3   nitroGLYCERIN (NITROSTAT) 0.4 MG SL tablet PLACE 1 TABLET UNDER THE TONGUE EVERY 5 MINUTES AS NEEDED FOR CHEST PAIN 25 tablet 3   omega-3 acid ethyl esters (LOVAZA) 1 g capsule Take 2 g by mouth 2 (two) times daily.     Probiotic Product (PROBIOTIC PO) Take 1 tablet by mouth every other day.     spironolactone (ALDACTONE) 25 MG tablet Take 1 tablet (25 mg total) by mouth daily. 30 tablet 11   TRELEGY ELLIPTA 100-62.5-25 MCG/ACT AEPB Take 1 puff by mouth daily.     No current facility-administered medications for this encounter.    Allergies:   Ambien [zolpidem tartrate], Lisinopril, and Brilinta [ticagrelor]   Social History:  The patient  reports that  he quit smoking about 9 years ago. His smoking use included cigarettes. He started smoking about 49 years ago. He has a 80 pack-year smoking history. He has never  used smokeless tobacco. He reports current alcohol use of about 14.0 standard drinks of alcohol per week. He reports that he does not use drugs.   Family History:  The patient's  family history includes COPD in his mother; Crohn's disease in his brother; Heart disease in his mother; Hypertension in his father and mother; Prostate cancer in his father.   Review of systems complete and found to be negative unless listed in HPI.      Recent Labs: 12/31/2022: TSH 5.050 05/08/2023: Pro B Natriuretic peptide (BNP) 433.0 06/01/2023: B Natriuretic Peptide 851.1 07/17/2023: ALT 20 09/23/2023: BUN 17; Creatinine, Ser 1.41; Magnesium 1.9; Potassium 4.7; Sodium 140 10/08/2023: Hemoglobin 14.0; Platelets 291.0   Lipid Panel     Component Value Date/Time   CHOL 127 11/07/2021 1212   CHOL 101 12/04/2014 1030   TRIG 170 (H) 11/07/2021 1212   HDL 51 11/07/2021 1212   HDL 43 12/04/2014 1030   CHOLHDL 2.5 11/07/2021 1212   VLDL 34 11/07/2021 1212   LDLCALC 42 11/07/2021 1212   LDLCALC 39 12/04/2014 1030   Wt Readings from Last 3 Encounters:  11/13/23 96.6 kg (213 lb)  10/08/23 96.3 kg (212 lb 3.2 oz)  09/23/23 96.3 kg (212 lb 6.4 oz)   BP 114/62   Pulse 83   Wt 96.6 kg (213 lb)   SpO2 95%   BMI 26.62 kg/m   Physical Exam General:  NAD. No resp difficulty HEENT: Normal Neck: Supple. No JVD. Carotids 2+ bilat; no bruits. No lymphadenopathy or thryomegaly appreciated. Cor: PMI nondisplaced. Regular rate & rhythm. No rubs, gallops or murmurs. Lungs: Clear Abdomen: Soft, nontender, nondistended. No hepatosplenomegaly. No bruits or masses. Good bowel sounds. Extremities: No cyanosis, clubbing, rash, edema Neuro: Alert & oriented x 3, cranial nerves grossly intact. Moves all 4 extremities w/o difficulty. Affect pleasant.  Device interrogation (personally reviewed): <1% VP, 1 episode of NSVT (10/06/23), CorVue stable  ASSESSMENT AND PLAN: 1. Chronic systolic due to ischemic CM  - Echo 03/2016  LVEF 20-25%, Grade 2 DD - Echo 3/18 shows with stable LVEF 20-25%. Normal RV.  - Echo 9/18 EF 20-25%, grade 2 DD  - CPX 2018 pVO2 19.6 (69%) slope 37 RER 1.06 FEV1 (68%)  - CPX 7/24: pVO2: 15.0 (57%) slope 44 RER 1.12 - Echo 05/16/20  EF 25% RV normal. - Echo 1/23 EF 25-30%  - Echo 10/24: EF 25-30% - Overall stable NYHA II, volume OK on exam and device interrogation today - Continue Lasix 20 mg PRN - Continue Entresto 24/26 mg bid. BP too low to titrate today. - Continue Toprol XL 25 mg daily.  - Continue spiro 25 mg daily.   - Off SGLT2i with yeast. - Dr. Gala Romney reviewed CPX data in depth. Currently does not meet criteria for advanced therapies but need to watch closely. We also discussed possible Barostim vs CCM therapy as adjunct. For now have encouraged him to lose about 10 pounds and be more active. Can consider repeat CPX in 6 months.  - Recent labs reviewed and are stable, K 4.7, SCr 1.41  2.  CAD s/p Anterior MI December 2015:  - Cath 8/22 with stable CAD - No s/s angina - Continue statin, no ASA with AC - Lipids per Dr. Clelia Croft.   3. AF/AFL - seen on zio 5% AF  burden - Placed on amio/Eliqus - Had AF ablation 08/04/22, remains off amio with concerns of ILD - Continue Eliquis 5 mg bid. No bleeding issues - Remains in NSR on ICD interrogation  4. H/o VT - had SVT/VT in 12/22 - Device reprogrammed. Off amio   5. Tobacco abuse - Remains quit  6. Emphysema/ILD - Sees pulmonary. Spirometry on CPX previously reviewed with only mild disease.  - Hi-res CT 5/24 suggestive of ILD but ? Whether related to amio. Amio now off and has been treated with steroids x 4 weeks.  - Hi-res CT (9/24) showed stable lung scarring, ? Chronic hypersensitivity pneumonitis - Follows with Dr. Marchelle Gearing   7. Snoring - Check sleep study. Has device at home.  - Encouraged him to take it   Follow up in 3 months with Dr. Gala Romney  Jacklynn Ganong, FNP  1:43 PM

## 2023-11-12 ENCOUNTER — Telehealth: Payer: Self-pay | Admitting: Internal Medicine

## 2023-11-12 DIAGNOSIS — J9611 Chronic respiratory failure with hypoxia: Secondary | ICD-10-CM

## 2023-11-12 NOTE — Telephone Encounter (Signed)
    Kyle Bullock -date of birth 29-Jan-1951.  He had a overnight pulse oximetry study done on 11/10/2023.  Time spent less than or equal to 88% was 51 minutes and 56 seconds which is 13% of the total sleep.  Plan - Recommend 2 L nasal oxygen.  I think he he had declined portable oxygen.  But do offer him this result and the opportunity to take portable oxygen.  Let him know that if we do not prescribe it within 30 days then he will be subject to a retest per CMS criteria

## 2023-11-13 ENCOUNTER — Encounter (HOSPITAL_COMMUNITY): Payer: Self-pay

## 2023-11-13 ENCOUNTER — Ambulatory Visit (HOSPITAL_COMMUNITY)
Admission: RE | Admit: 2023-11-13 | Discharge: 2023-11-13 | Disposition: A | Discharge status: Home / Self Care | Payer: PPO | Source: Ambulatory Visit | Attending: Family Medicine

## 2023-11-13 VITALS — BP 114/62 | HR 83 | Wt 213.0 lb

## 2023-11-13 DIAGNOSIS — I255 Ischemic cardiomyopathy: Secondary | ICD-10-CM | POA: Diagnosis not present

## 2023-11-13 DIAGNOSIS — R0683 Snoring: Secondary | ICD-10-CM | POA: Insufficient documentation

## 2023-11-13 DIAGNOSIS — I472 Ventricular tachycardia, unspecified: Secondary | ICD-10-CM | POA: Diagnosis not present

## 2023-11-13 DIAGNOSIS — I110 Hypertensive heart disease with heart failure: Secondary | ICD-10-CM | POA: Insufficient documentation

## 2023-11-13 DIAGNOSIS — I5022 Chronic systolic (congestive) heart failure: Secondary | ICD-10-CM | POA: Insufficient documentation

## 2023-11-13 DIAGNOSIS — I4891 Unspecified atrial fibrillation: Secondary | ICD-10-CM | POA: Insufficient documentation

## 2023-11-13 DIAGNOSIS — I2510 Atherosclerotic heart disease of native coronary artery without angina pectoris: Secondary | ICD-10-CM | POA: Insufficient documentation

## 2023-11-13 DIAGNOSIS — J439 Emphysema, unspecified: Secondary | ICD-10-CM | POA: Insufficient documentation

## 2023-11-13 DIAGNOSIS — I480 Paroxysmal atrial fibrillation: Secondary | ICD-10-CM | POA: Diagnosis not present

## 2023-11-13 DIAGNOSIS — Z7901 Long term (current) use of anticoagulants: Secondary | ICD-10-CM | POA: Diagnosis not present

## 2023-11-13 DIAGNOSIS — Z87891 Personal history of nicotine dependence: Secondary | ICD-10-CM | POA: Diagnosis not present

## 2023-11-13 DIAGNOSIS — J849 Interstitial pulmonary disease, unspecified: Secondary | ICD-10-CM | POA: Insufficient documentation

## 2023-11-13 DIAGNOSIS — I471 Supraventricular tachycardia, unspecified: Secondary | ICD-10-CM | POA: Diagnosis not present

## 2023-11-13 DIAGNOSIS — Z79899 Other long term (current) drug therapy: Secondary | ICD-10-CM | POA: Diagnosis not present

## 2023-11-13 DIAGNOSIS — I252 Old myocardial infarction: Secondary | ICD-10-CM | POA: Insufficient documentation

## 2023-11-13 NOTE — Patient Instructions (Addendum)
Thank you for coming in today  If you had labs drawn today, any labs that are abnormal the clinic will call you No news is good news  Please do at home sleep study  Medications: No changes   Follow up appointments:  Your physician recommends that you schedule a follow-up appointment in:  3 months With Dr. Gala Romney You will receive a reminder letter in the mail a few months in advance. If you don't receive a letter, please call our office to schedule the follow-up appointment.    Do the following things EVERYDAY: Weigh yourself in the morning before breakfast. Write it down and keep it in a log. Take your medicines as prescribed Eat low salt foods--Limit salt (sodium) to 2000 mg per day.  Stay as active as you can everyday Limit all fluids for the day to less than 2 liters   At [facility], you and your health needs are our priority. As part of our continuing mission to provide you with exceptional heart care, we have created designated Provider Care Teams. These Care Teams include your primary Cardiologist (physician) and Advanced Practice Providers (APPs- Physician Assistants and Nurse Practitioners) who all work together to provide you with the care you need, when you need it.   You may see any of the following providers on your designated Care Team at your next follow up: Dr Arvilla Meres Dr Marca Ancona Dr. Marcos Eke, NP Robbie Lis, [place] [facility] [place], [place] Brynda Peon, NP Karle Plumber, PharmD   Please be sure to bring in all your medications bottles to every appointment.    Thank you for choosing  [facility]  If you have any questions or concerns before your next appointment please send [place] a message through [place] or call our office at [phone].    TO LEAVE A MESSAGE FOR THE NURSE SELECT OPTION 2, PLEASE LEAVE A MESSAGE INCLUDING: YOUR NAME DATE OF  BIRTH CALL BACK NUMBER REASON FOR CALL**this is important as we prioritize the call backs  YOU WILL RECEIVE A CALL BACK THE SAME DAY AS LONG AS YOU CALL BEFORE 4:00 PM

## 2023-11-16 DIAGNOSIS — H2513 Age-related nuclear cataract, bilateral: Secondary | ICD-10-CM | POA: Diagnosis not present

## 2023-11-23 ENCOUNTER — Encounter: Payer: Self-pay | Admitting: Internal Medicine

## 2023-11-27 NOTE — Telephone Encounter (Signed)
I called and spoke with the pt and notified of results/recs per MR  He verbalized understanding  He again, refused to have portable o2  He only wants to use at night  I have placed DME referral for this  Nothing further needed

## 2023-11-30 ENCOUNTER — Encounter: Payer: Self-pay | Admitting: Internal Medicine

## 2023-12-01 ENCOUNTER — Ambulatory Visit (INDEPENDENT_AMBULATORY_CARE_PROVIDER_SITE_OTHER): Payer: PPO

## 2023-12-01 DIAGNOSIS — I5022 Chronic systolic (congestive) heart failure: Secondary | ICD-10-CM | POA: Diagnosis not present

## 2023-12-07 ENCOUNTER — Telehealth: Payer: Self-pay | Admitting: Internal Medicine

## 2023-12-07 LAB — CUP PACEART REMOTE DEVICE CHECK
Battery Remaining Longevity: 29 mo
Battery Remaining Percentage: 26 %
Battery Voltage: 2.93 V
Brady Statistic RV Percent Paced: 1 %
Date Time Interrogation Session: 20241228233932
HighPow Impedance: 64 Ohm
HighPow Impedance: 64 Ohm
Implantable Lead Connection Status: 753985
Implantable Lead Implant Date: 20160323
Implantable Lead Location: 753860
Implantable Pulse Generator Implant Date: 20160323
Lead Channel Impedance Value: 350 Ohm
Lead Channel Pacing Threshold Amplitude: 0.75 V
Lead Channel Pacing Threshold Pulse Width: 0.5 ms
Lead Channel Sensing Intrinsic Amplitude: 12 mV
Lead Channel Setting Pacing Amplitude: 2.5 V
Lead Channel Setting Pacing Pulse Width: 0.5 ms
Lead Channel Setting Sensing Sensitivity: 0.5 mV
Pulse Gen Serial Number: 7255600
Zone Setting Status: 755011

## 2023-12-07 NOTE — Telephone Encounter (Signed)
Per [employer] Patient cancelled O2, He provided them with card and once driver called for delivery he cancelled.

## 2023-12-08 ENCOUNTER — Telehealth (HOSPITAL_COMMUNITY): Payer: Self-pay | Admitting: Cardiology

## 2023-12-08 NOTE — Telephone Encounter (Signed)
 Patient called to report he attempted to use watchpat device and given error code that device was NOT REGISTERED -pt was given device a year ago  Advised will confirm PA is still valid and if auth valid will re-register device  -message to Phillica B    Pt voiced understanding -note ok to respond via my chart

## 2023-12-10 NOTE — Telephone Encounter (Signed)
 Pt aware PA is not needed Will return call with watchpat serial number

## 2023-12-10 NOTE — Telephone Encounter (Signed)
 Ok that is his choice. I will d/w him

## 2023-12-11 NOTE — Telephone Encounter (Signed)
 [employer]ic registered

## 2023-12-13 ENCOUNTER — Encounter (INDEPENDENT_AMBULATORY_CARE_PROVIDER_SITE_OTHER): Payer: Self-pay | Admitting: Cardiology

## 2023-12-13 DIAGNOSIS — G4733 Obstructive sleep apnea (adult) (pediatric): Secondary | ICD-10-CM

## 2023-12-14 ENCOUNTER — Ambulatory Visit: Payer: PPO | Attending: Internal Medicine

## 2023-12-14 DIAGNOSIS — G4733 Obstructive sleep apnea (adult) (pediatric): Secondary | ICD-10-CM

## 2023-12-14 NOTE — Patient Instructions (Addendum)
 ICD-10-CM   1. Centrilobular emphysema (HCC)  J43.2     2. ILD (interstitial lung disease) (HCC)  J84.9 Hepatic function panel    Hepatic function panel    3. IPF (idiopathic pulmonary fibrosis) (HCC)  J84.112 Hepatic function panel    Hepatic function panel    4. Exercise hypoxemia  R09.02     5. Severe sleep apnea  G47.30     6. Encounter for therapeutic drug monitoring  Z51.81       #Emphysema  - stable  Plan  - continue trelegy scheduled with albuteorl as needed  # Interstitial lung disease otherwise called pulmonary fibrosis # IPF  -I am giving her diagnosis of idiopathic pulmonary fibrosis [based on the fact age greater than 32, previous smoking history, male gender, Caucasian ethnicity and also associated emphysema)  Plan YOu have emphysema but you also have fibrosis Fibrosis did not seem to be there in 2018 but started in 2019 and progressive mildly since then IPF disease  but amiodarone  2021 and 2023  and covid 2022 might have helped it a bit getting worse   Plan  - Start Esbriet  per protocol  -You have to wear sunscreen with this medication  -Take 1 pill 3 times daily for 1 week and then go up to 2 pills 3 times daily for 1 other week and then go to full dose of 3 pills 3 times daily  -Always take with food  -Space medication at least 5-6 hours apart  -Check liver function test today 12/15/2023    #Exercise hypoxemia and new diagnosis of sleep apnea 12/13/2023  Plan  - Respect that you do not want to start portable oxygen right now -Please touch base with cardiology and start treatment for sleep apnea -We will reassess exercise oxygen needs in the future  Followup  - return in 6  weeks with nurse practitioner Denissa Cozart to discuss pirfenidone  uptake

## 2023-12-14 NOTE — Procedures (Signed)
 SLEEP STUDY REPORT Patient Information Study Date: 12/13/2023 Patient Name: Kyle Bullock . Breeden Patient ID: [id number] Birth Date: [birth year] Age: 73 Gender:  BMI: 37.9 (W=214 lb, H=5' 3'') Referring Physician: Dan [name], MD  TEST DESCRIPTION: Home sleep apnea testing was completed using the WatchPat, a Type 1 device, utilizing  peripheral arterial tonometry (PAT), chest movement, actigraphy, pulse oximetry, pulse rate, body position and snore.  AHI was calculated with apnea and hypopnea using valid sleep time as the denominator. RDI includes apneas,  hypopneas, and RERAs. The data acquired and the scoring of sleep and all associated events were performed in  accordance with the recommended standards and specifications as outlined in the AASM Manual for the Scoring of  Sleep and Associated Events 2.2.0 (2015).  FINDINGS:  1. Severe Obstructive Sleep Apnea with AHI 50.5/hr.   2. Moderate Central Sleep Apnea with pAHIc 22.9/hr.  3. Oxygen desaturations as low as 81%.  4. Severe snoring was present. O2 sats were < 88% for 40.9 min.  5. Total sleep time was 5 hrs and 53 min.  6. 9.3% of total sleep time was spent in REM sleep.   7. sleep onset latency at 24 min.   8. REM sleep onset latency at 364 min.   9. Total awakenings were 29.  10. Arrhythmia detection: Suggestive of possible brief atrial fibrillation lasting 41 seconds. This is not diagnostic and further testing with outpatient telemetry monitoring is recommended.  DIAGNOSIS:  Severe Obstructive Sleep Apnea (G47.33) Moderate Obstructive Sleep Apnea Nocturnal Hypoxemia Possible Atrial Fibrillation  RECOMMENDATIONS: 1. Clinical correlation of these findings is necessary. The decision to treat obstructive sleep apnea (OSA) is usually  based on the presence of apnea symptoms or the presence of associated medical conditions such as Hypertension,  Congestive Heart Failure, Atrial Fibrillation or Obesity. The most common  symptoms of OSA are snoring, gasping for  breath while sleeping, daytime sleepiness and fatigue.   2. Initiating apnea therapy is recommended given the presence of symptoms and/or associated conditions.  Recommend proceeding with one of the following:   a. Auto-CPAP therapy with a pressure range of 5-20cm H2O.   b. An oral appliance (OA) that can be obtained from certain dentists with expertise in sleep medicine. These are  primarily of use in non-obese patients with mild and moderate disease.   c. An ENT consultation which may be useful to look for specific causes of obstruction and possible treatment  options.   d. If patient is intolerant to PAP therapy, consider referral to ENT for evaluation for hypoglossal nerve stimulator.   3. Close follow-up is necessary to ensure success with CPAP or oral appliance therapy for maximum benefit .  4. A follow-up oximetry study on CPAP is recommended to assess the adequacy of therapy and determine the need  for supplemental oxygen or the potential need for Bi-level therapy. An arterial blood gas to determine the adequacy of  baseline ventilation and oxygenation should also be considered.  5. Healthy sleep recommendations include: adequate nightly sleep (normal 7-9 hrs/night), avoidance of caffeine after  noon and alcohol  near bedtime, and maintaining a sleep environment that is cool, dark and quiet.  6. Weight loss for overweight patients is recommended. Even modest amounts of weight loss can significantly  improve the severity of sleep apnea.  7. Snoring recommendations include: weight loss where appropriate, side sleeping, and avoidance of alcohol  before  bed.  8. Operation of motor vehicle should be avoided when sleepy.  Signature: Wilbert Bihari,  MD; CODY; Diplomat, American Board of Sleep  Medicine Electronically Signed: 12/14/2023 2:05:30 PM

## 2023-12-14 NOTE — Progress Notes (Signed)
 Synopsis: Referred in 2016 for evaluation of COPD. Has ischemic cardiomyopathy after a large MI. LVEF 25-30% as per echocardiogram in March 2016. December 2015 CT angiogram chest: No pulmonary embolism, mild centrilobular and paraseptal emphysema and upper lobe distribution bilaterally, small pleural effusion on the left, there is dependent groundglass and mild interlobular septal thickening bilaterally Spirometry as part of June 2016 cardiopulmonary exercise test: Ratio 73%, FEV1 2.4 L (61% predicted), FVC 3.27 L (66% predicted). 06/2015 CT chest HRCT> very mild patches of ground glass in the bases of both lungs with non-specific interstitial changes, no clear fibrotic change; scattered pulmonary nodules appear stable compared to 11/2014, emphysema and bronchial wall thickening noted July 2016 pulmonary function testing: Ratio 73% predicted, FEV1 2.42 L (59% predicted) FVC 3.33 L (61% predicted), total lung capacity 5.74 L (71% predicted), DLCO 15.23 (38% predicted), Hgb 13.5  = COPD GOLD 0  [place] imp/plan  06/25/15  Centrilobular emphysema  CT scan from July 2016. This showed mild centrilobular emphysema bilaterally. There was a question of some very mild nonspecific interstitial changes in the periphery of the right lung and some scant dependent changes in the left lung.  >>>follow  with at least one repeat CT scan in one year with a repeat lung function test.  Multiple pulmonary nodules He had a few scattered small pulmonary nodules. These have been stable on 2 separate images. The radiologist has recommended a repeat CT chest in one year.    Toward end of April 2024 nasal congestion/ muscle aches/sorethroat bad cough clear mucus rx  doxy/omnicef but really didn't turn the corner until prednisone  rx    05/08/2023  New Pt eval/ Pulmonary ov /Wert re: COPD 0/ emphysema on and off amiodarone  since 05/2020  maint on Trelegy since Feb 2024 from anoro while also back  on amiodarone  daily  since 08/04/22  Chief Complaint  Patient presents with   Consult    SOB with exertion x 6 weeks.  Review CT scan 05/01/2023. Concern with taking amiodarone .   Dyspnea:  was walking 2.5 miles per day and stopped and now back to mile and a half and finished prednisone  on 05/07/23 p 6 day Cough: loose clear  Sleeping: bed is flat/ one pillow  SABA use: none  02: none    No obvious day to day or daytime variability or assoc excess/ purulent sputum or mucus plugs or hemoptysis or cp or chest tightness, subjective wheeze or overt sinus or hb symptoms.   Sleeping  without nocturnal  or early am exacerbation  of respiratory  c/o's or need for noct saba. Also denies any obvious fluctuation of symptoms with weather or environmental changes or other aggravating or alleviating factors except as outlined above   No unusual exposure hx or h/o childhood pna/ asthma or knowledge of premature birth.         OV 10/08/2023 -transfer of care from Kyle Bullock to Kyle Bullock in the ILD center.  Referred by Kyle Bullock and Kyle Bullock primary care.  Subjective:  Patient ID: Kyle Bullock, male , DOB: 1951/02/07 , age 73 y.o. , MRN: [id number] , ADDRESS: [address] [place] [address] PCP Kyle Bullock., MD Patient Care Team: Kyle Bullock., MD as PCP - General (Internal Medicine) Kyle Kyle DASEN, MD as PCP - Electrophysiology (Cardiology)  This Provider for this visit: Treatment Team:  Attending Provider: Geronimo Amel, MD    10/08/2023 -  Chief Complaint  Patient presents with   Follow-up    Pt is here for F/U visit. Pt had PFT done 10-08-2023.     HPI Kyle Bullock 73 y.o. -history is provided by the patient, his wife and also review of the external medical record.  He is retired.  He is a wine salesman.  Now worked in [employer].  He is known to have ischemic cardiomyopathy and has a defibrillator.  He sees Kyle Bullock for atrial  fibrillation.  He sees Kyle Bullock for chronic systolic heart failure all his problems started in December 2015 when he suffered VF arrest and status post STEMI with drug-eluting stent to the LAD December 2015.  This was in [place] .  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  He has been a patient of Kyle Bullock per chart review since August 2022.  As of 2022 it was observed he had emphysema and no ILD.  He had seen Dr. Vicenta Lennert in this pulmonary clinic.  His previous history of amiodarone  was between June 2021 and October 2021.  Most recently he says that he status post ablation in the fall 2023.  Review of the records confirm this happen for atrial fibrillation on 08/04/2022.  He was then placed on amiodarone .  Then he said getting dyspneic in May 2024 he did have a high-resolution CT chest that showed ILD.  He saw Kyle Bullock.  Subsequently placed on prednisone  for 6 weeks by Dr. Holts.  After the prednisone  and discontinuation of the amiodarone  he started feeling better but he still not back to his summer 2023 baseline although he is better compared to April 2023.  And his most recent visit with Kyle Bullock he said he was feeling better and walking to 20 miles per day 3 to 4 days/week.  In July 2024 he did have a cardiopulmonary stress test that showed a VO2 max of 15 mL/kg/min.  Unclear if desaturation test was done.  [In 2018 he had done 19 mL/kg/min] in addition in this 1 there was heavy dead space ventilation.  Review of his imaging shows that in my personal visualization and impression in 2018 all he had was emphysema in 2019 some ILD started showing up in terms of groundglass opacities by 2021 I think this reticulation and by 2022 there is definite ILD.  His most recent 2 scans of May 2024 and September 2024.  Alternative pattern is described by the radiologist.  I personally visualized it and agree with that.  So lower probably T4 UIP/IPF but he does  have associated emphysema.  For his emphysema he is on Trelegy  Current symptom scores as below   And walking desaturation test today he desaturated pretty fast he needed 4 L to correct.  We are discharging him on portable oxygen.   ILD symptom another questionnaire   Past medical history ILD relevant - History of amiodarone  therapy present - Did have COVID in 2022  Family history of lung disease - Mother had COPD - Brother had asthma  Personal exposure history - Smoked heavily between 51 and 2015 1 pack/day. - No marijuana no cocaine no intravenous drug use  Personal exposure history - [place] age of the current home is 46 years.  He is lived there for the 30 years.  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  He used to distribute wine from wholesaler to retailer but he was now exposed to be needed by the grape leaves can have mold  Pulmonary toxicity history - History of amiodarone  therapy twice.  Otherwise negative.        Narrative & Impression  CLINICAL DATA:  73 year old male with history of interstitial lung disease.   EXAM: CT CHEST WITHOUT CONTRAST   TECHNIQUE: Multidetector CT imaging of the chest was performed following the standard protocol without intravenous contrast. High resolution imaging of the lungs, as well as inspiratory and expiratory imaging, was performed.   RADIATION DOSE REDUCTION: This exam was performed according to the departmental dose-optimization program which includes automated exposure control, adjustment of the mA and/or kV according to patient size and/or use of iterative reconstruction technique.   COMPARISON:  High-resolution chest CT 05/01/2023.   FINDINGS: Cardiovascular: Heart size is borderline enlarged. There is no significant pericardial fluid, thickening or  pericardial calcification. There is aortic atherosclerosis, as well as atherosclerosis of the great vessels of the mediastinum and the coronary arteries, including calcified atherosclerotic plaque in the left main, left anterior descending, left circumflex and right coronary arteries. Left-sided pacemaker/AICD with lead tip terminating in the right ventricle.   Mediastinum/Nodes: No pathologically enlarged mediastinal or hilar lymph nodes. Please note that accurate exclusion of hilar adenopathy is limited on noncontrast CT scans. Esophagus is unremarkable in appearance. No axillary lymphadenopathy.   Lungs/Pleura: High-resolution images again demonstrate widespread but patchy areas of ground-glass attenuation, septal thickening, subpleural reticulation, cylindrical traction bronchiectasis, peripheral bronchiolectasis and some upper lung predominant honeycombing which appears minimally progressive compared to the prior examination. Inspiratory and expiratory imaging demonstrates some air trapping indicative of small airways disease. No acute consolidative airspace disease. No pleural effusions. Calcified granuloma in the periphery of the right lower lobe. A few scattered small pulmonary nodules are noted, largest of which is a sessile appearing pleural-based nodule in the posterior right upper lobe (axial image 37 of series 6) measuring 1.4 x 0.6 cm, stable. Other smaller pulmonary nodules are stable. No other larger more suspicious appearing pulmonary nodules or masses are noted. Diffuse bronchial wall thickening with mild to moderate centrilobular and paraseptal emphysema.   Upper Abdomen: Aortic atherosclerosis.   Musculoskeletal: There are no aggressive appearing lytic or blastic lesions noted in the visualized portions of the skeleton.   IMPRESSION: 1. Fibrotic changes in the lungs once again considered most compatible with an alternative diagnosis (not usual  interstitial pneumonia) per current ATS guidelines. Overall, given the presence of air trapping and upper lung predominant honeycombing, findings are once again favored to reflect chronic hypersensitivity pneumonitis. 2. Mild diffuse bronchial wall thickening with mild to moderate centrilobular and paraseptal emphysema; imaging findings suggestive of COPD. 3. Stable pulmonary nodules, likely benign. Continued attention at time of routine annual low-dose lung cancer screening chest CTs is recommended. 4. Aortic atherosclerosis, in addition to left main and three-vessel coronary artery disease. Assessment for potential risk factor modification, dietary therapy or pharmacologic therapy may be warranted, if clinically indicated.   Aortic Atherosclerosis (ICD10-I70.0) and Emphysema (ICD10-J43.9).     Electronically Signed   By: Kyle Aye M.D.   On: 08/27/2023 11:25       OV 12/15/2023  Subjective:  Patient ID: Kyle Bullock, male , DOB: 10-May-1951 , age 73 y.o. , MRN: [id number] , ADDRESS: [address] [place] [address] PCP Loreli Kyle Bullock., MD Patient Care Team: Loreli Kyle Bullock., MD as  PCP - General (Internal Medicine) Kyle Kyle DASEN, MD as PCP - Electrophysiology (Cardiology)  This Provider for this visit: Treatment Team:  Attending Provider: Geronimo Amel, MD    12/15/2023 -   Chief Complaint  Patient presents with   Follow-up    Pt states he has been good. Denies any concerns      HPI Kyle Bullock 73 y.o. -presents with his wife.  His ILD workup in progress.  Known chronic systolic heart failure since cardiac arrest in 2015.  Former smoker with known emphysema.   She is an independent shistorian.  She is a retired engineer, civil (consulting).  At the last visit was the first intake consult for ILD.  Towards the end of the visit I was surprised that he had exercise hypoxemia and this required 4 L for correction.  Therefore recommendation for using  portable oxygen was made.  He he and his wife expressed dissatisfaction and the wait was communicated.  Apologized for that and took accountability.  Since then he had overnight pulse oximetry study.  This was abnormal.  Recommended nighttime oxygen but he has declined.  He did have a sleep study done on 12/13/2023.  He was not aware of the results to live mentioned to him.  This show she has severe sleep apnea AHI 50.5 obstructive and moderate central sleep apnea 22.9/h along with oxygen desaturations less than 88% for 41 minutes.  Otherwise in the interim he reports no change in his health status no admissions to the hospital no ER visits no urgent care visits.  He is here to discuss his workup so far.  He is hypersensitive pneumonitis panel autoimmune panel is all negative.  We discussed in case conference.  That the thinking was that in the upper lobes he had smoking-related interstitial fibrosis and in the lower lobes it was more consistent with probable UIP therefore raising the possibility of IPF.  He definitely has associated emphysema.  I again visualized the CT scans with him.  There was definitely some early ILD in 2016 but it is definitely progressed.   Overall explained to him that we he does have -Associated emphysema with pulmonary fibrosis -The pulmonary fibrosis is progressive -There is lack of clarity between radiologist as to the definitive pattern of pulmonary fibrosis which then suggest that he meets indication for lung biopsy but given his heart failure I would think the risk of lung biopsy is significant -Will give him a clinical diagnosis of IPF based on age, male gender, former smoking history, associate emphysema, progression and conference suggesting probable UIP in the bases. -Explained that if alternative etiologies such as amiodarone  or high percent pneumonitis are consideration then steroids would be indicated.  He did recall that he did do a course of steroids with his of  amiodarone .  He did not like the side effects and it did not help him.  -Explained that antifibrotic's are indicated.  Went over pirfenidone  and nintedanib.  Given his previous cardiac history and the fact he is on anticoagulation do not recommend nintedanib other than being a second line.  Recommend pirfenidone .  Went over details of the drug and the insurance approval process.  He understood   -Also explained the rationale for oxygen.  Particularly with improvement in quality of life.  At this point in time he is not keen on portable oxygen.  However he is agreed to follow-up with the sleep specialist   SYMPTOM SCALE - ILD 10/08/2023 12/15/2023   Current weight  O2 use ra ra  Shortness of Breath 0 -> 5 scale with 5 being worst (score 6 If unable to do)   At rest 1 0  Simple tasks - showers, clothes change, eating, shaving 1 0  Household (dishes, doing bed, laundry) 2 2  Shopping 2 1  Walking level at own pace 2 1  Walking up Stairs 2 2  Total (30-36) Dyspnea Score 10 6  How bad is your cough? 0 1  How bad is your fatigue 1 1  How bad is nausea 0 0  How bad is vomiting?  0 0  How bad is diarrhea? 0 1  How bad is anxiety? 1 1  How bad is depression 1 1  Any chronic pain - if so where and how bad 1    0    Simple office walk 224 (66+46 x 2) feet Pod A at [facility] x  3 laps goal with forehead probe 10/08/2023  10/08/2023   O2 used ra ra  Number laps completed Sit stand x 4 time Half laps  Comments about pace Good pac   Resting Pulse Ox/HR 98% and 72/min 97%   Final Pulse Ox/HR 83% and 98/min 85% in just half la  Desaturated </= 88% TES   Desaturated <= 3% points no   Got Tachycardic >/= 90/min yes   Symptoms at end of test duyspnea Needed 4L [place] to correct  to wal 2 LAps  Miscellaneous comments x      PFT     Latest Ref Rng & Units 10/08/2023   10:48 AM 06/22/2018    1:52 PM 06/14/2015   10:58 AM  ILD indicators  FVC-Pre L 3.15  3.55  3.33   FVC-Predicted Pre % 60   66  61   FVC-Post L 3.18  3.58    FVC-Predicted Post % 61  66    TLC L 4.21  5.55  5.74   TLC Predicted % 52  69  71   DLCO uncorrected ml/min/mmHg 11.94  16.18  15.23   DLCO UNC %Pred % 40  41  38   DLCO Corrected ml/min/mmHg 11.94     DLCO COR %Pred % 40         LAB RESULTS last 96 hours No results found.  LAB RESULTS last 90 days Recent Results (from the past 2160 hours)  Basic metabolic panel     Status: Abnormal   Collection Time: 09/23/23  9:34 AM  Result Value Ref Range   Glucose 86 70 - 99 mg/dL   BUN 17 8 - 27 mg/dL   Creatinine, Ser 8.58 (H) 0.76 - 1.27 mg/dL   eGFR 53 (L) >40 fO/fpw/8.26   BUN/Creatinine Ratio 12 10 - 24   Sodium 140 134 - 144 mmol/L   Potassium 4.7 3.5 - 5.2 mmol/L   Chloride 103 96 - 106 mmol/L   CO2 23 20 - 29 mmol/L   Calcium  9.4 8.6 - 10.2 mg/dL  Magnesium     Status: None   Collection Time: 09/23/23  9:34 AM  Result Value Ref Range   Magnesium 1.9 1.6 - 2.3 mg/dL  CUP PACEART INCLINIC DEVICE CHECK     Status: None   Collection Time: 09/23/23  9:47 AM  Result Value Ref Range   Date Time Interrogation Session [id number]    Pulse Generator Manufacturer SJCR    Pulse Gen Model 1357-40Q Fortify Assura VR    Pulse Gen Serial Number [id number]  Clinic Name [facility]    Implantable Pulse Generator Type Implantable Cardiac Defibulator    Implantable Pulse Generator Implant Date [id number]    Implantable Lead Manufacturer [facility]    Implantable Lead Model [id number] [facility] SJ4    Implantable Lead Serial Number [id number]    Implantable Lead Implant Date 79839676    Implantable Lead Location Detail 1 SEPTUM    Implantable Lead Location [id number]    Implantable Lead Connection Status [id number]    Lead Channel Setting Sensing Sensitivity 0.5 mV   Lead Channel Setting Pacing Pulse Width 0.5 ms   Lead Channel Setting Pacing Amplitude 2.5 V   Zone Setting Status Active    Zone Setting Status Active    Zone Setting Status [phone]    Lead Channel  Impedance Value 362.5 ohm   Lead Channel Sensing Intrinsic Amplitude 12.0 mV   Lead Channel Pacing Threshold Amplitude 0.75 V   Lead Channel Pacing Threshold Pulse Width 0.5 ms   Lead Channel Pacing Threshold Amplitude 0.75 V   Lead Channel Pacing Threshold Pulse Width 0.5 ms   HighPow Impedance 65.25 ohm   Battery Remaining Longevity 31 mo   Brady Statistic RV Percent Paced 0.27 %  Pulmonary function test     Status: None   Collection Time: 10/08/23 10:48 AM  Result Value Ref Range   FVC-Pre 3.15 L   FVC-%Pred-Pre 60 %   FVC-Post 3.18 L   FVC-%Pred-Post 61 %   FVC-%Change-Post 0 %   FEV1-Pre 2.43 L   FEV1-%Pred-Pre 64 %   FEV1-Post 2.52 L   FEV1-%Pred-Post 66 %   FEV1-%Change-Post 3 %   FEV6-Pre 3.15 L   FEV6-%Pred-Pre 64 %   FEV6-Post 3.17 L   FEV6-%Pred-Post 64 %   FEV6-%Change-Post 0 %   Pre FEV1/FVC ratio 77 %   FEV1FVC-%Pred-Pre 105 %   Post FEV1/FVC ratio 79 %   FEV1FVC-%Change-Post 2 %   Pre FEV6/FVC Ratio 100 %   FEV6FVC-%Pred-Pre 105 %   Post FEV6/FVC ratio 100 %   FEV6FVC-%Pred-Post 105 %   FEV6FVC-%Change-Post 0 %   FEF 25-75 Pre 2.25 L/sec   FEF2575-%Pred-Pre 80 %   FEF 25-75 Post 2.52 L/sec   FEF2575-%Pred-Post 89 %   FEF2575-%Change-Post 11 %   RV 1.20 L   RV % pred 43 %   TLC 4.21 L   TLC % pred 52 %   DLCO unc 11.94 ml/min/mmHg   DLCO unc % pred 40 %   DLCO cor 11.94 ml/min/mmHg   DLCO cor % pred 40 %   DL/VA 7.15 ml/min/mmHg/L   DL/VA % pred 72 %  Hypersensitivity Pneumonitis     Status: None   Collection Time: 10/08/23  2:27 PM  Result Value Ref Range   A.Fumigatus #1 Abs Negative Negative   Micropolyspora faeni, IgG Negative Negative   Thermoactinomyces vulgaris, IgG Negative Negative   A. Pullulans Abs Negative Negative   Thermoact. Saccharii Negative Negative   Pigeon Serum Abs Negative Negative  CBC w/Diff     Status: Abnormal   Collection Time: 10/08/23  2:27 PM  Result Value Ref Range   WBC 8.7 4.0 - 10.5 K/uL   RBC 4.47 4.22 -  5.81 Mil/uL   Hemoglobin 14.0 13.0 - 17.0 g/dL   HCT 56.9 60.9 - 47.9 %   MCV 96.2 78.0 - 100.0 fl   MCHC 32.6 30.0 - 36.0 g/dL   RDW 86.1 88.4 - 84.4 %   Platelets 291.0 150.0 - 400.0 K/uL  Neutrophils Relative % 77.8 (H) 43.0 - 77.0 %   Lymphocytes Relative 10.8 (L) 12.0 - 46.0 %   Monocytes Relative 9.1 3.0 - 12.0 %   Eosinophils Relative 1.4 0.0 - 5.0 %   Basophils Relative 0.9 0.0 - 3.0 %   Neutro Abs 6.8 1.4 - 7.7 K/uL   Lymphs Abs 0.9 0.7 - 4.0 K/uL   Monocytes Absolute 0.8 0.1 - 1.0 K/uL   Eosinophils Absolute 0.1 0.0 - 0.7 K/uL   Basophils Absolute 0.1 0.0 - 0.1 K/uL  Sed Rate (ESR)     Status: None   Collection Time: 10/08/23  2:27 PM  Result Value Ref Range   Sed Rate 20 0 - 20 mm/hr  Rheumatoid Factor     Status: None   Collection Time: 10/08/23  2:27 PM  Result Value Ref Range   Rheumatoid fact SerPl-aCnc <10 <14 IU/mL  Cyclic citrul peptide antibody, IgG     Status: None   Collection Time: 10/08/23  2:27 PM  Result Value Ref Range   Cyclic Citrullin Peptide Ab <83 UNITS    Comment: Reference Range Negative:            <20 Weak Positive:       20-39 Moderate Positive:   40-59 Strong Positive:     >59 .   ANA+ENA+DNA/DS+Scl 70+SjoSSA/B     Status: None   Collection Time: 10/08/23  2:27 PM  Result Value Ref Range   ANA Titer 1 Negative     Comment:                                      Negative   <1:80                                      Borderline  1:80                                      Positive   >1:80 ICAP nomenclature: AC-0 For more information about Hep-2 cell patterns use ANApatterns.org, the official website for the International Consensus on Antinuclear Antibody (ANA) Patterns (ICAP).    dsDNA Ab <1 0 - 9 IU/mL    Comment:                                    Negative      <5                                    Equivocal  5 - 9                                    Positive      >9    ENA RNP Ab <0.2 0.0 - 0.9 AI   ENA SM Ab Ser-aCnc <0.2 0.0 -  0.9 AI   Scleroderma (Scl-70) (ENA) Antibody, IgG <0.2 0.0 - 0.9 AI   ENA SSA (RO) Ab <0.2 0.0 - 0.9 AI   ENA SSB (LA) Ab <0.2 0.0 -  0.9 AI  QuantiFERON-TB Gold Plus     Status: None   Collection Time: 10/08/23  2:27 PM  Result Value Ref Range   QuantiFERON-TB Gold Plus NEGATIVE NEGATIVE    Comment: Negative test result. M. tuberculosis complex  infection unlikely.    NIL 0.03 IU/mL   Mitogen-NIL 6.54 IU/mL   TB1-NIL 0.01 IU/mL   TB2-NIL 0.01 IU/mL    Comment: . The Nil tube value reflects the background interferon gamma immune response of the patient's blood sample. This value has been subtracted from the patient's displayed TB and Mitogen results. . Lower than expected results with the Mitogen tube prevent false-negative Quantiferon readings by detecting a patient with a potential immune suppressive condition and/or suboptimal pre-analytical specimen handling. . The TB1 Antigen tube is coated with the M. tuberculosis-specific antigens designed to elicit responses from TB antigen primed CD4+ helper T-lymphocytes. . The TB2 Antigen tube is coated with the M. tuberculosis-specific antigens designed to elicit responses from TB antigen primed CD4+ helper and CD8+ cytotoxic T-lymphocytes. . For additional information, please refer to https://education.[facility].com/faq/FAQ204 (This link is being provided for informational/ educational purposes only.) .   CUP PACEART REMOTE DEVICE CHECK     Status: None   Collection Time: 12/05/23 11:39 PM  Result Value Ref Range   Date Time Interrogation Session [id number]    Pulse Generator Manufacturer SJCR    Pulse Gen Model 1357-40Q Fortify Assura VR    Pulse Gen Serial Number [id number]    Clinic Name [facility]    Implantable Pulse Generator Type Implantable Cardiac Defibulator    Implantable Pulse Generator Implant Date [id number]    Implantable Lead Manufacturer [facility]    Implantable Lead Model [id number] [employer] SJ4     Implantable Lead Serial Number [id number]    Implantable Lead Implant Date [id number]    Implantable Lead Location Detail 1 SEPTUM    Implantable Lead Location [id number]    Implantable Lead Connection Status [phone]    Lead Channel Setting Sensing Sensitivity 0.5 mV   Lead Channel Setting Sensing Adaptation Mode Adaptive Sensing    Lead Channel Setting Pacing Pulse Width 0.5 ms   Lead Channel Setting Pacing Amplitude 2.5 V   Zone Setting Status Active    Zone Setting Status Active    Zone Setting Status [phone]    Lead Channel Status NULL    Lead Channel Impedance Value 350 ohm   Lead Channel Sensing Intrinsic Amplitude 12.0 mV   Lead Channel Pacing Threshold Amplitude 0.75 V   Lead Channel Pacing Threshold Pulse Width 0.5 ms   HighPow Impedance 64 ohm   HighPow Impedance 64 ohm   HighPow Imped Status NULL    HighPow Imped Status NULL    Battery Status MOS    Battery Remaining Longevity 29 mo   Battery Remaining Percentage 26.0 %   Battery Voltage 2.93 V   Brady Statistic RV Percent Paced 1.0 %   Eval Rhythm SR at 65 bpm/PVCs          has a past medical history of Abnormal TSH, AICD (automatic cardioverter/defibrillator) present (02/28/2015), CAD (coronary artery disease), CHF (congestive heart failure) (HCC), Dressler's syndrome (HCC), Dyslipidemia, Dyspnea, Erectile dysfunction, Hypertension, Ischemic cardiomyopathy, NSVT (nonsustained ventricular tachycardia) (HCC) (11/16/14), Pericardial effusion, Pericarditis (dx'd 11/2014), Pneumonia (1990's X 2), RBBB, Sleep apnea, STEMI (ST elevation myocardial infarction) (HCC) (11/10/2014), Tobacco abuse, and VF (ventricular fibrillation) (HCC) (11/10/14).   reports that he quit smoking about 9 years ago. His smoking use included cigarettes. He  started smoking about 49 years ago. He has a 80 pack-year smoking history. He has never used smokeless tobacco.  Past Surgical History:  Procedure Laterality Date   A-FLUTTER ABLATION N/A 09/04/2020    Procedure: A-FLUTTER ABLATION;  Surgeon: Kelsie Agent, MD;  [facility]: [facility];  Service: Cardiovascular;  Laterality: N/A;   ATRIAL FIBRILLATION ABLATION N/A 08/04/2022   Procedure: ATRIAL FIBRILLATION ABLATION;  Surgeon: Kyle Kyle DASEN, MD;  Location: [facility];  Service: Cardiovascular;  Laterality: N/A;   CARDIAC CATHETERIZATION  11/19/2014   CARDIAC DEFIBRILLATOR PLACEMENT  02/28/2015   COLONOSCOPY WITH PROPOFOL  N/A 03/16/2023   Procedure: COLONOSCOPY WITH PROPOFOL ;  Surgeon: Federico Rosario BROCKS, MD;  Location: [facility];  Service: Gastroenterology;  Laterality: N/A;   CORONARY ANGIOPLASTY WITH STENT PLACEMENT  11/10/2014   LAD DES   HAND SURGERY Bilateral    Dupuytren's Contracture   IMPLANTABLE CARDIOVERTER DEFIBRILLATOR IMPLANT N/A 02/28/2015   SJM Fortify Assura VR ICD implanted by Dr Kelsie.  Revascularized following VF arrest, EF remained depressed and ICD implanted   KNEE ARTHROSCOPY Right 12/08/1980   LEFT HEART CATH N/A 11/19/2014   Procedure: LEFT HEART CATH;  Surgeon: Kyle JINNY Lesches, MD;  Location: [facility] CATH LAB;  Service: Cardiovascular;  Laterality: N/A;   POLYPECTOMY  03/16/2023   Procedure: POLYPECTOMY;  Surgeon: Federico Rosario BROCKS, MD;  Location: [facility];  Service: Gastroenterology;;   RIGHT/LEFT HEART CATH AND CORONARY ANGIOGRAPHY N/A 07/22/2021   Procedure: RIGHT/LEFT HEART CATH AND CORONARY ANGIOGRAPHY;  Surgeon: Cherrie Kyle SAUNDERS, MD;  [facility]: [facility];  Service: Cardiovascular;  Laterality: N/A;    Allergies  Allergen Reactions   Ambien  [Zolpidem  Tartrate] Anxiety and Other (See Comments)    Causes nightmares   Lisinopril  Cough   Brilinta  [Ticagrelor ] Cough    ? Dyspnea. Changed to Effient  11/2014.    Immunization History  Administered Date(s) Administered   DTaP, 5 pertussis antigens 12/14/2015   Influenza, High Dose Seasonal PF 09/16/2015, 08/30/2017   Influenza, Quadrivalent, Recombinant, Inj, Pf 09/04/2018, 08/27/2019,  09/08/2020, 09/14/2021   Influenza-Unspecified 08/17/2014, 09/20/2016   PFIZER(Purple Top)SARS-COV-2 Vaccination 12/28/2019, 01/16/2020   Pneumococcal Conjugate-13 07/26/2015   Pneumococcal Polysaccharide-23 11/13/2014, 12/18/2016   Zoster Recombinant(Shingrix) 07/02/2019, 11/10/2019    Family History  Problem Relation Age of Onset   COPD Mother    Hypertension Mother    Heart disease Mother    Hypertension Father    Prostate cancer Father    Crohn's disease Brother    CAD Neg Hx    Colon cancer Neg Hx    Stomach cancer Neg Hx    Esophageal cancer Neg Hx      Current Outpatient Medications:    acetaminophen  (TYLENOL ) 500 MG tablet, Take 500 mg by mouth every 8 (eight) hours as needed for moderate pain., Disp: , Rfl:    atorvastatin  (LIPITOR) 20 MG tablet, TAKE 1 TABLET BY MOUTH DAILY, Disp: 90 tablet, Rfl: 3   cyanocobalamin  (VITAMIN B12) 1000 MCG tablet, Take 1,000 mcg by mouth daily., Disp: , Rfl:    ELIQUIS  5 MG TABS tablet, TAKE 1 TABLET(5 MG) BY MOUTH TWICE DAILY, Disp: 60 tablet, Rfl: 5   ENTRESTO  24-26 MG, TAKE 1 TABLET BY MOUTH TWICE DAILY, Disp: 180 tablet, Rfl: 3   furosemide  (LASIX ) 20 MG tablet, TAKE 1 TABLET BY MOUTH DAILY AS NEEDED, Disp: 30 tablet, Rfl: 2   levothyroxine (SYNTHROID) 112 MCG tablet, Take 112 mcg by mouth daily before breakfast., Disp: , Rfl:  metoprolol  succinate (TOPROL -XL) 25 MG 24 hr tablet, Take 1 tablet (25 mg total) by mouth at bedtime., Disp: 90 tablet, Rfl: 3   nitroGLYCERIN  (NITROSTAT ) 0.4 MG SL tablet, PLACE 1 TABLET UNDER THE TONGUE EVERY 5 MINUTES AS NEEDED FOR CHEST PAIN, Disp: 25 tablet, Rfl: 3   omega-3 acid ethyl esters (LOVAZA) 1 g capsule, Take 2 g by mouth 2 (two) times daily., Disp: , Rfl:    Probiotic Product (PROBIOTIC PO), Take 1 tablet by mouth every other day., Disp: , Rfl:    spironolactone  (ALDACTONE ) 25 MG tablet, Take 1 tablet (25 mg total) by mouth daily., Disp: 30 tablet, Rfl: 11   TRELEGY ELLIPTA 100-62.5-25 MCG/ACT  AEPB, Take 1 puff by mouth daily., Disp: , Rfl:       Objective:   Vitals:   12/15/23 1551  BP: 108/64  Pulse: 69  SpO2: 98%  Weight: 211 lb 12.8 oz (96.1 kg)  Height: 6' 2 (1.88 m)    Estimated body mass index is 27.19 kg/m as calculated from the following:   Height as of this encounter: 6' 2 (1.88 m).   Weight as of this encounter: 211 lb 12.8 oz (96.1 kg).  @WEIGHTCHANGE @  [employer]   12/15/23 1551  Weight: 211 lb 12.8 oz (96.1 kg)     Physical Exam   General: No distress. Looks well O2 at rest: no Cane present: no Sitting in wheel chair: no Frail: no Obese: no Neuro: Alert and Oriented x 3. GCS 15. Speech normal Psych: Pleasant Resp:  Barrel Chest - non.  Wheeze - o, Crackles - mild possible crackles, No overt respiratory distress CVS: Normal heart sounds. Murmurs - no Ext: Stigmata of Connective Tissue Disease - no HEENT: Normal upper airway. PEERL +. No post nasal drip        Assessment:       ICD-10-CM   1. Centrilobular emphysema (HCC)  J43.2     2. ILD (interstitial lung disease) (HCC)  J84.9 Hepatic function panel    Hepatic function panel    3. IPF (idiopathic pulmonary fibrosis) (HCC)  J84.112 Hepatic function panel    Hepatic function panel    4. Exercise hypoxemia  R09.02     5. Severe sleep apnea  G47.30     6. Encounter for therapeutic drug monitoring  Z51.81          Plan:     Patient Instructions     ICD-10-CM   1. Centrilobular emphysema (HCC)  J43.2     2. ILD (interstitial lung disease) (HCC)  J84.9     3. IPF (idiopathic pulmonary fibrosis) (HCC)  J84.112     4. Exercise hypoxemia  R09.02     5. Severe sleep apnea  G47.30       #Emphysema  - stable  Plan  - continue trelegy scheduled with albuteorl as needed  # Interstitial lung disease otherwise called pulmonary fibrosis # IPF  -I am giving her diagnosis of idiopathic pulmonary fibrosis [based on the fact age greater than 54, previous smoking  history, male gender, Caucasian ethnicity and also associated emphysema)  Plan YOu have emphysema but you also have fibrosis Fibrosis did not seem to be there in 2018 but started in 2019 and progressive mildly since then IPF disease  but amiodarone  2021 and 2023  and covid 2022 might have helped it a bit getting worse   Plan  - Start Esbriet  per protocol  -You have to wear sunscreen with this  medication  -Take 1 pill 3 times daily for 1 week and then go up to 2 pills 3 times daily for 1 other week and then go to full dose of 3 pills 3 times daily  -Always take with food  -Space medication at least 5-6 hours apart  -Check liver function test today 12/15/2023    #Exercise hypoxemia and new diagnosis of sleep apnea 12/13/2023  Plan  - Respect that you do not want to start portable oxygen right now -Please touch base with cardiology and start treatment for sleep apnea -We will reassess exercise oxygen needs in the future  Followup  - return in 6  weeks with nurse practitioner Porschea Borys to discuss pirfenidone  uptake   FOLLOWUP Return in about 6 weeks (around 01/26/2024) for 30 min visit, with Dr Kyle, with any of the APPS, Face to Face OR Video Visit.  ( Level 05 visit E&M 2024: Estb >= 40 min n  visit type: on-site physical face to visit  in total care time and counseling or/and coordination of care by this undersigned MD - Dr Dorethia Kyle. This includes one or more of the following on this same day 12/15/2023: pre-charting, chart review, note writing, documentation discussion of test results, diagnostic or treatment recommendations, prognosis, risks and benefits of management options, instructions, education, compliance or risk-factor reduction. It excludes time spent by the CMA or office staff in the care of the patient. Actual time 45 min)   SIGNATURE    Dr. Dorethia Kyle, M.D., F.C.C.P,  Pulmonary and Critical Care Medicine Staff Physician, [facility]  Director - Interstitial Lung Disease  Program  Pulmonary Fibrosis [facility] at [facility] [place], [place], [address]  Pager: [phone], If no answer or between  15:00h - 7:00h: call [phone] Telephone: [phone]  6:21 PM 12/15/2023

## 2023-12-15 ENCOUNTER — Ambulatory Visit: Payer: PPO | Admitting: Internal Medicine

## 2023-12-15 ENCOUNTER — Encounter: Payer: Self-pay | Admitting: Internal Medicine

## 2023-12-15 VITALS — BP 108/64 | HR 69 | Ht 74.0 in | Wt 211.8 lb

## 2023-12-15 DIAGNOSIS — Z5181 Encounter for therapeutic drug level monitoring: Secondary | ICD-10-CM | POA: Diagnosis not present

## 2023-12-15 DIAGNOSIS — J432 Centrilobular emphysema: Secondary | ICD-10-CM

## 2023-12-15 DIAGNOSIS — J849 Interstitial pulmonary disease, unspecified: Secondary | ICD-10-CM

## 2023-12-15 DIAGNOSIS — J84112 Idiopathic pulmonary fibrosis: Secondary | ICD-10-CM

## 2023-12-15 DIAGNOSIS — G4730 Sleep apnea, unspecified: Secondary | ICD-10-CM | POA: Diagnosis not present

## 2023-12-15 DIAGNOSIS — R0902 Hypoxemia: Secondary | ICD-10-CM

## 2023-12-16 LAB — HEPATIC FUNCTION PANEL
ALT: 14 U/L (ref 0–53)
AST: 18 U/L (ref 0–37)
Albumin: 4.4 g/dL (ref 3.5–5.2)
Alkaline Phosphatase: 78 U/L (ref 39–117)
Bilirubin, Direct: 0.2 mg/dL (ref 0.0–0.3)
Total Bilirubin: 0.6 mg/dL (ref 0.2–1.2)
Total Protein: 6.9 g/dL (ref 6.0–8.3)

## 2023-12-21 ENCOUNTER — Telehealth: Payer: Self-pay

## 2023-12-21 DIAGNOSIS — G4733 Obstructive sleep apnea (adult) (pediatric): Secondary | ICD-10-CM

## 2023-12-21 DIAGNOSIS — I2510 Atherosclerotic heart disease of native coronary artery without angina pectoris: Secondary | ICD-10-CM

## 2023-12-21 DIAGNOSIS — R0683 Snoring: Secondary | ICD-10-CM

## 2023-12-21 DIAGNOSIS — I5022 Chronic systolic (congestive) heart failure: Secondary | ICD-10-CM

## 2023-12-21 DIAGNOSIS — I4901 Ventricular fibrillation: Secondary | ICD-10-CM

## 2023-12-21 DIAGNOSIS — I480 Paroxysmal atrial fibrillation: Secondary | ICD-10-CM

## 2023-12-21 DIAGNOSIS — I10 Essential (primary) hypertension: Secondary | ICD-10-CM

## 2023-12-21 DIAGNOSIS — I472 Ventricular tachycardia, unspecified: Secondary | ICD-10-CM

## 2023-12-21 NOTE — Telephone Encounter (Addendum)
 Notified patient of Sleep study results and recommendations. All questions were answered and patient verbalized understanding. CPAP Titration ordered 2/25

## 2023-12-21 NOTE — Telephone Encounter (Signed)
-----   Message from Wilbert Bihari sent at 12/14/2023  2:06 PM EST ----- Please let patient know that they have sleep apnea.  Recommend therapeutic CPAP titration for treatment of patient's sleep disordered breathing.  If unable to perform an in lab titration then initiate ResMed auto CPAP from 4 to 15cm H2O with heated humidity and mask of choice and overnight pulse ox on CPAP.

## 2023-12-22 ENCOUNTER — Other Ambulatory Visit (HOSPITAL_COMMUNITY): Payer: Self-pay | Admitting: Cardiology

## 2023-12-22 MED ORDER — ENTRESTO 24-26 MG PO TABS: 1.0000 | ORAL_TABLET | Freq: Two times a day (BID) | ORAL | 3 refills | Status: AC

## 2023-12-29 NOTE — Progress Notes (Unsigned)
  Electrophysiology Office Note:   ID:  Waylynn, Leinberger 02/04/1951, MRN [id number]  Primary Cardiologist: None Electrophysiologist: Lanier Prude, MD  {Click to update primary MD,subspecialty MD or APP then REFRESH:1}    History of Present Illness:   CELESTE RUESGA is a 73 y.o. male with h/o CAD, AFL s/p CTI ablation, AF s/p AF ablation, chronic systolic CHF, and h/o VT seen today for routine electrophysiology followup.   Since last being seen in our clinic the patient reports doing ***.  he denies chest pain, palpitations, dyspnea, PND, orthopnea, nausea, vomiting, dizziness, syncope, edema, weight gain, or early satiety.   Review of systems complete and found to be negative unless listed in HPI.    EP Information / Studies Reviewed:    EKG is ordered today. Personal review as below.       ICD Interrogation-  reviewed in detail today,  See PACEART report.  Device History: St. Jude Single Chamber ICD implanted 2016 for chronic systolic CHF / ICM    Arrhythmia History  AF ablation 07/2022   Echo 09/10/2023 LVEF 25-30%, RV mildly reduced   CPX test 07/06/2023 Exercise testing with gas exchange demonstrates moderate functional limitation when compared to matched sedentary norms. There is a moderate HF limitation with pulmonary limitations playing a role with moderate restrictive lung physiology. HF appears primary limiting factor and has high risk feature of severely elevated VE/VCO2 slope. Overall, CPX slightly worsened from 2018.   Cath 07/2021 with patent LAD stent and moderate non-obstructive disease otherwise.   Physical Exam:   VS:  There were no vitals taken for this visit.   Wt Readings from Last 3 Encounters:  12/15/23 211 lb 12.8 oz (96.1 kg)  11/13/23 213 lb (96.6 kg)  10/08/23 212 lb 3.2 oz (96.3 kg)     GEN: No acute distress *** NECK: No JVD; No carotid bruits CARDIAC: {EPRHYTHM:28826}, no murmurs, rubs, gallops RESPIRATORY:  Clear to auscultation  without rales, wheezing or rhonchi  ABDOMEN: Soft, non-tender, non-distended EXTREMITIES:  {EDEMA LEVEL:28147::"No"} edema; No deformity   ASSESSMENT AND PLAN:    Chronic systolic CHF  s/p Abbott single chamber ICD  euvolemic today Stable on an appropriate medical regimen Normal ICD function See Pace Art report No changes today  H/o VT/VF Interestingly, these episodes of VT and the episode for which he was shocked in 11/2021 both occurred with fairly brisk exertion.  Had Mod/Severe HF limitation 06/2023 with CPx testing, did not have VT at that time.  Continue Toprol ***  Persistent AF EKG today shows *** Previously taken off amiodarone with possible lung toxicity  CAD Denies s/s ischemia  Disposition:   Follow up with {EPPROVIDERS:28135} {EPFOLLOW UP:28173}   Signed, Graciella Freer, PA-C

## 2023-12-30 ENCOUNTER — Encounter: Payer: Self-pay | Admitting: Student

## 2023-12-30 ENCOUNTER — Ambulatory Visit: Payer: PPO | Attending: Student | Admitting: Student

## 2023-12-30 ENCOUNTER — Encounter: Payer: Self-pay | Admitting: Cardiology

## 2023-12-30 VITALS — BP 94/62 | HR 72 | Ht 75.0 in | Wt 213.0 lb

## 2023-12-30 DIAGNOSIS — I5022 Chronic systolic (congestive) heart failure: Secondary | ICD-10-CM

## 2023-12-30 DIAGNOSIS — I472 Ventricular tachycardia, unspecified: Secondary | ICD-10-CM

## 2023-12-30 DIAGNOSIS — G4733 Obstructive sleep apnea (adult) (pediatric): Secondary | ICD-10-CM | POA: Diagnosis not present

## 2023-12-30 DIAGNOSIS — I2510 Atherosclerotic heart disease of native coronary artery without angina pectoris: Secondary | ICD-10-CM

## 2023-12-30 LAB — CUP PACEART INCLINIC DEVICE CHECK
Battery Remaining Longevity: 28 mo
Brady Statistic RV Percent Paced: 0.56 %
Date Time Interrogation Session: 20250122121523
HighPow Impedance: 66.375
Implantable Lead Connection Status: 753985
Implantable Lead Implant Date: 20160323
Implantable Lead Location: 753860
Implantable Pulse Generator Implant Date: 20160323
Lead Channel Impedance Value: 362.5 Ohm
Lead Channel Pacing Threshold Amplitude: 0.75 V
Lead Channel Pacing Threshold Amplitude: 0.75 V
Lead Channel Pacing Threshold Pulse Width: 0.5 ms
Lead Channel Pacing Threshold Pulse Width: 0.5 ms
Lead Channel Sensing Intrinsic Amplitude: 12 mV
Lead Channel Setting Pacing Amplitude: 2.5 V
Lead Channel Setting Pacing Pulse Width: 0.5 ms
Lead Channel Setting Sensing Sensitivity: 0.5 mV
Pulse Gen Serial Number: 7255600
Zone Setting Status: 755011

## 2023-12-30 NOTE — Patient Instructions (Signed)
Medication Instructions:  Your physician recommends that you continue on your current medications as directed. Please refer to the Current Medication list given to you today.  *If you need a refill on your cardiac medications before your next appointment, please call your pharmacy*  Lab Work: BMET-TODAY If you have labs (blood work) drawn today and your tests are completely normal, you will receive your results only by: MyChart Message (if you have MyChart) OR A paper copy in the mail If you have any lab test that is abnormal or we need to change your treatment, we will call you to review the results.  Follow-Up: At [facility], you and your health needs are our priority.  As part of our continuing mission to provide you with exceptional heart care, we have created designated Provider Care Teams.  These Care Teams include your primary Cardiologist (physician) and Advanced Practice Providers (APPs -  Physician Assistants and Nurse Practitioners) who all work together to provide you with the care you need, when you need it.  Your next appointment:   6 month(s)  Provider:   Steffanie Dunn, MD

## 2023-12-31 ENCOUNTER — Telehealth: Payer: Self-pay | Admitting: Pharmacist

## 2023-12-31 DIAGNOSIS — J84112 Idiopathic pulmonary fibrosis: Secondary | ICD-10-CM

## 2023-12-31 LAB — BASIC METABOLIC PANEL
BUN/Creatinine Ratio: 14 (ref 10–24)
BUN: 18 mg/dL (ref 8–27)
CO2: 23 mmol/L (ref 20–29)
Calcium: 9.4 mg/dL (ref 8.6–10.2)
Chloride: 104 mmol/L (ref 96–106)
Creatinine, Ser: 1.28 mg/dL — ABNORMAL HIGH (ref 0.76–1.27)
Glucose: 97 mg/dL (ref 70–99)
Potassium: 5.3 mmol/L — ABNORMAL HIGH (ref 3.5–5.2)
Sodium: 141 mmol/L (ref 134–144)
eGFR: 59 mL/min/{1.73_m2} — ABNORMAL LOW (ref >59)

## 2023-12-31 NOTE — Telephone Encounter (Signed)
Enrolled patient into PF grant through [employer]: Award Period: 07/04/2023 - 12/30/2024 ID: [id number] BIN: [id number] PCN: [id number] Group: [id number] For pharmacy inquiries, contact PDMI at [phone].

## 2023-12-31 NOTE — Telephone Encounter (Signed)
Received new start paperwork for pirfenidone  Submitted a Prior Authorization request to [facility] ADVANTAGE/RX ADVANCE for PIRFENIDONE via CoverMyMeds. Will update once we receive a response.  Key: [id number]

## 2024-01-01 ENCOUNTER — Other Ambulatory Visit: Payer: Self-pay | Admitting: *Deleted

## 2024-01-01 ENCOUNTER — Telehealth: Payer: Self-pay | Admitting: Cardiology

## 2024-01-01 DIAGNOSIS — Z79899 Other long term (current) drug therapy: Secondary | ICD-10-CM

## 2024-01-01 NOTE — Telephone Encounter (Signed)
Patient is requesting call back to go over medication instructions given to him on 01/22. Please advise.

## 2024-01-01 NOTE — Telephone Encounter (Signed)
Left message for patient to callback to discuss recent medication changes made today (no med changes on 12/30/23 seen). Also sent MyChart message with medication changes and F/U lab.

## 2024-01-07 ENCOUNTER — Other Ambulatory Visit: Payer: Self-pay

## 2024-01-07 DIAGNOSIS — G4733 Obstructive sleep apnea (adult) (pediatric): Secondary | ICD-10-CM

## 2024-01-08 DIAGNOSIS — Z79899 Other long term (current) drug therapy: Secondary | ICD-10-CM | POA: Diagnosis not present

## 2024-01-08 NOTE — Telephone Encounter (Signed)
PA on CMM did not submit. Completed HTA form for pirfenidone PA. Faxed to HTA with clinicals  Fax: [phone]  Chesley Mires, PharmD, MPH, BCPS, CPP Clinical Pharmacist (Rheumatology and Pulmonology)

## 2024-01-09 LAB — BASIC METABOLIC PANEL
BUN/Creatinine Ratio: 14 (ref 10–24)
BUN: 18 mg/dL (ref 8–27)
CO2: 21 mmol/L (ref 20–29)
Calcium: 9.3 mg/dL (ref 8.6–10.2)
Chloride: 101 mmol/L (ref 96–106)
Creatinine, Ser: 1.25 mg/dL (ref 0.76–1.27)
Glucose: 100 mg/dL — ABNORMAL HIGH (ref 70–99)
Potassium: 4.8 mmol/L (ref 3.5–5.2)
Sodium: 140 mmol/L (ref 134–144)
eGFR: 60 mL/min/{1.73_m2} (ref >59)

## 2024-01-11 ENCOUNTER — Encounter: Payer: Self-pay | Admitting: Internal Medicine

## 2024-01-13 NOTE — Addendum Note (Signed)
Addended by: Geralyn Flash D on: 01/13/2024 10:23 AM   Modules accepted: Orders

## 2024-01-14 NOTE — Telephone Encounter (Signed)
 Received a fax regarding Prior Authorization from [facility] ADVANTAGE/RX ADVANCE for PIRFENIDONE . Authorization has been DENIED because patient must have diagnosis of IPF.  Will need to pursue appeal  Sherry Pennant, PharmD, MPH, BCPS, CPP Clinical Pharmacist (Rheumatology and Pulmonology)

## 2024-01-15 NOTE — Telephone Encounter (Signed)
 Please see mychart message sent by pt about Esbriet  and advise.

## 2024-01-22 NOTE — Telephone Encounter (Signed)
Submitted an URGENT appeal to [facility] ADVANTAGE/RX ADVANCE for PIRFENIDONE.  Phone: [phone] Fax: [phone]  Chesley Mires, PharmD, MPH, BCPS, CPP Clinical Pharmacist (Rheumatology and Pulmonology)

## 2024-01-27 ENCOUNTER — Ambulatory Visit: Payer: PPO | Admitting: Internal Medicine

## 2024-01-29 ENCOUNTER — Other Ambulatory Visit: Payer: Self-pay | Admitting: Pharmacist

## 2024-01-29 ENCOUNTER — Other Ambulatory Visit (HOSPITAL_COMMUNITY): Payer: Self-pay | Admitting: Cardiology

## 2024-01-29 ENCOUNTER — Other Ambulatory Visit (HOSPITAL_COMMUNITY): Payer: Self-pay

## 2024-01-29 MED ORDER — PIRFENIDONE 267 MG PO TABS
ORAL_TABLET | ORAL | 0 refills | Status: DC
  Filled 2024-02-01: qty 207, 30d supply, fill #0

## 2024-01-29 MED ORDER — PIRFENIDONE 267 MG PO TABS
801.0000 mg | ORAL_TABLET | Freq: Three times a day (TID) | ORAL | 4 refills | Status: DC
  Filled 2024-02-01 – 2024-02-25 (×2): qty 270, 30d supply, fill #0
  Filled 2024-03-31: qty 270, 30d supply, fill #1
  Filled 2024-04-27: qty 270, 30d supply, fill #2
  Filled 2024-05-30 – 2024-05-31 (×2): qty 270, 30d supply, fill #3
  Filled 2024-06-29 – 2024-07-04 (×2): qty 270, 30d supply, fill #4

## 2024-01-29 MED ORDER — ATORVASTATIN CALCIUM 20 MG PO TABS
ORAL_TABLET | ORAL | 3 refills | Status: AC
Start: 2024-01-29 — End: ?

## 2024-01-29 NOTE — Telephone Encounter (Signed)
Kyle Bullock  Here overnight desaturates from his IPF.  He also has severe sleep apnea.  He is now asking question about using his CPAP and his night oxygen.  Are you able to advise him on this?  Thanks    SIGNATURE    Dr. Kalman Shan, M.D., F.C.C.P,  Pulmonary and Critical Care Medicine Staff Physician, [facility] Director - Interstitial Lung Disease  Program  Pulmonary Fibrosis [facility] at [facility] [place], [place], [address]   Pager: [phone], If no answer  -> Check AMION or Try [phone] Telephone (clinical office): [phone] Telephone (research): [phone]  4:24 PM 01/29/2024

## 2024-01-29 NOTE — Progress Notes (Signed)
Patient starting pirfenidone for IPF. Grant on file.  Dose w 267mg  tabs: Take 1 tab three times daily for 7 days, then 2 tabs three times daily for 7 days, then 3 tabs three times daily thereafter.  Chesley Mires, PharmD, MPH, BCPS, CPP Clinical Pharmacist (Rheumatology and Pulmonology)

## 2024-01-29 NOTE — Telephone Encounter (Signed)
Patient states he received notification from HTA that prifenidone was approved. Test claim processed successfully and patient has grant on file. Rx sent to [facility]

## 2024-02-01 ENCOUNTER — Other Ambulatory Visit: Payer: Self-pay

## 2024-02-01 ENCOUNTER — Other Ambulatory Visit (HOSPITAL_COMMUNITY): Payer: Self-pay

## 2024-02-01 NOTE — Progress Notes (Signed)
 Specialty Pharmacy Initial Fill Coordination Note  Kyle Bullock is a 73 y.o. male contacted today regarding initial fill of specialty medication(s) Pirfenidone   Patient requested Delivery   Delivery date: 02/09/24   Verified address: [address]   [place] [address]   Medication will be filled on 02/08/24.   Patient has grant on file and is aware of $0 copayment.

## 2024-02-02 ENCOUNTER — Telehealth: Payer: Self-pay

## 2024-02-02 NOTE — Addendum Note (Signed)
 Addended by: Brunetta Genera on: 02/02/2024 10:24 AM   Modules accepted: Orders

## 2024-02-02 NOTE — Telephone Encounter (Signed)
**Note De-Identified Alanea Woolridge Obfuscation** Per the HTA/[employer] provider portal this PA has been approved from 02/02/2024 - 05/02/2024. Outpatient Authorization [id number]  I have forwarded the CPAP Titration order to the sleep lab so they can contact the pt to scheduled the test.

## 2024-02-08 ENCOUNTER — Other Ambulatory Visit: Payer: Self-pay

## 2024-02-18 ENCOUNTER — Other Ambulatory Visit (HOSPITAL_COMMUNITY): Payer: Self-pay

## 2024-02-18 ENCOUNTER — Encounter: Payer: Self-pay | Admitting: Cardiology

## 2024-02-18 ENCOUNTER — Telehealth: Payer: Self-pay | Admitting: Cardiology

## 2024-02-18 NOTE — Telephone Encounter (Signed)
 Spoke with patient and he wanted to know what is his next steps after his sleep studies. He also stated the he received a call to do an over night sleep test.  Informed patient that he was approved for CPAP titration which means he will do overnight in the sleep lab while wearing a cpap to see what settings work for him. ( Confirmed with Brandie Rorie LPN) Advised patient call to set up appointment because his PA expire in May. He verbalized understanding

## 2024-02-18 NOTE — Telephone Encounter (Signed)
 Error

## 2024-02-18 NOTE — Telephone Encounter (Signed)
 Patient wants a call back from RN Altha regarding next steps after sleep studies.

## 2024-02-19 DIAGNOSIS — Z125 Encounter for screening for malignant neoplasm of prostate: Secondary | ICD-10-CM | POA: Diagnosis not present

## 2024-02-19 DIAGNOSIS — E785 Hyperlipidemia, unspecified: Secondary | ICD-10-CM | POA: Diagnosis not present

## 2024-02-19 DIAGNOSIS — I5022 Chronic systolic (congestive) heart failure: Secondary | ICD-10-CM | POA: Diagnosis not present

## 2024-02-19 DIAGNOSIS — R7301 Impaired fasting glucose: Secondary | ICD-10-CM | POA: Diagnosis not present

## 2024-02-19 DIAGNOSIS — E039 Hypothyroidism, unspecified: Secondary | ICD-10-CM | POA: Diagnosis not present

## 2024-02-19 DIAGNOSIS — I130 Hypertensive heart and chronic kidney disease with heart failure and stage 1 through stage 4 chronic kidney disease, or unspecified chronic kidney disease: Secondary | ICD-10-CM | POA: Diagnosis not present

## 2024-02-19 DIAGNOSIS — N1831 Chronic kidney disease, stage 3a: Secondary | ICD-10-CM | POA: Diagnosis not present

## 2024-02-19 DIAGNOSIS — M109 Gout, unspecified: Secondary | ICD-10-CM | POA: Diagnosis not present

## 2024-02-25 ENCOUNTER — Other Ambulatory Visit (HOSPITAL_COMMUNITY): Payer: Self-pay

## 2024-02-25 ENCOUNTER — Other Ambulatory Visit: Payer: Self-pay

## 2024-02-25 NOTE — Progress Notes (Signed)
 Specialty Pharmacy Refill Coordination Note  Kyle Bullock is a 74 y.o. male contacted today regarding refills of specialty medication(s) Pirfenidone   Patient requested (Patient-Rptd) Delivery   Delivery date: (Patient-Rptd) 03/08/24   Verified address: (Patient-Rptd) [address] [place], [place]. [address]   Medication will be filled on 03.31.25.

## 2024-02-26 DIAGNOSIS — I130 Hypertensive heart and chronic kidney disease with heart failure and stage 1 through stage 4 chronic kidney disease, or unspecified chronic kidney disease: Secondary | ICD-10-CM | POA: Diagnosis not present

## 2024-02-26 DIAGNOSIS — J9611 Chronic respiratory failure with hypoxia: Secondary | ICD-10-CM | POA: Diagnosis not present

## 2024-02-26 DIAGNOSIS — M109 Gout, unspecified: Secondary | ICD-10-CM | POA: Diagnosis not present

## 2024-02-26 DIAGNOSIS — G4733 Obstructive sleep apnea (adult) (pediatric): Secondary | ICD-10-CM | POA: Diagnosis not present

## 2024-02-26 DIAGNOSIS — J84112 Idiopathic pulmonary fibrosis: Secondary | ICD-10-CM | POA: Diagnosis not present

## 2024-02-26 DIAGNOSIS — N1831 Chronic kidney disease, stage 3a: Secondary | ICD-10-CM | POA: Diagnosis not present

## 2024-02-26 DIAGNOSIS — F17201 Nicotine dependence, unspecified, in remission: Secondary | ICD-10-CM | POA: Diagnosis not present

## 2024-02-26 DIAGNOSIS — I5022 Chronic systolic (congestive) heart failure: Secondary | ICD-10-CM | POA: Diagnosis not present

## 2024-02-26 DIAGNOSIS — Z1331 Encounter for screening for depression: Secondary | ICD-10-CM | POA: Diagnosis not present

## 2024-02-26 DIAGNOSIS — I272 Pulmonary hypertension, unspecified: Secondary | ICD-10-CM | POA: Diagnosis not present

## 2024-02-26 DIAGNOSIS — Z860101 Personal history of adenomatous and serrated colon polyps: Secondary | ICD-10-CM | POA: Diagnosis not present

## 2024-02-26 DIAGNOSIS — Z1339 Encounter for screening examination for other mental health and behavioral disorders: Secondary | ICD-10-CM | POA: Diagnosis not present

## 2024-02-26 DIAGNOSIS — E785 Hyperlipidemia, unspecified: Secondary | ICD-10-CM | POA: Diagnosis not present

## 2024-02-26 DIAGNOSIS — R82998 Other abnormal findings in urine: Secondary | ICD-10-CM | POA: Diagnosis not present

## 2024-02-26 DIAGNOSIS — I2510 Atherosclerotic heart disease of native coronary artery without angina pectoris: Secondary | ICD-10-CM | POA: Diagnosis not present

## 2024-02-26 DIAGNOSIS — Z0000 Encounter for general adult medical examination without abnormal findings: Secondary | ICD-10-CM | POA: Diagnosis not present

## 2024-02-26 DIAGNOSIS — J432 Centrilobular emphysema: Secondary | ICD-10-CM | POA: Diagnosis not present

## 2024-03-01 ENCOUNTER — Ambulatory Visit (INDEPENDENT_AMBULATORY_CARE_PROVIDER_SITE_OTHER): Payer: PPO

## 2024-03-01 ENCOUNTER — Ambulatory Visit: Payer: PPO | Admitting: Internal Medicine

## 2024-03-01 ENCOUNTER — Telehealth: Payer: Self-pay | Admitting: Internal Medicine

## 2024-03-01 VITALS — BP 102/62 | HR 61 | Ht 75.0 in | Wt 214.6 lb

## 2024-03-01 DIAGNOSIS — G4730 Sleep apnea, unspecified: Secondary | ICD-10-CM

## 2024-03-01 DIAGNOSIS — Z122 Encounter for screening for malignant neoplasm of respiratory organs: Secondary | ICD-10-CM | POA: Diagnosis not present

## 2024-03-01 DIAGNOSIS — I472 Ventricular tachycardia, unspecified: Secondary | ICD-10-CM

## 2024-03-01 DIAGNOSIS — R0902 Hypoxemia: Secondary | ICD-10-CM | POA: Diagnosis not present

## 2024-03-01 DIAGNOSIS — Z5181 Encounter for therapeutic drug level monitoring: Secondary | ICD-10-CM | POA: Diagnosis not present

## 2024-03-01 DIAGNOSIS — K521 Toxic gastroenteritis and colitis: Secondary | ICD-10-CM | POA: Diagnosis not present

## 2024-03-01 DIAGNOSIS — J432 Centrilobular emphysema: Secondary | ICD-10-CM

## 2024-03-01 DIAGNOSIS — J84112 Idiopathic pulmonary fibrosis: Secondary | ICD-10-CM | POA: Diagnosis not present

## 2024-03-01 LAB — CUP PACEART REMOTE DEVICE CHECK
Battery Remaining Longevity: 26 mo
Battery Remaining Percentage: 25 %
Battery Voltage: 2.93 V
Brady Statistic RV Percent Paced: 1 %
Date Time Interrogation Session: 20250325020014
HighPow Impedance: 74 Ohm
HighPow Impedance: 74 Ohm
Implantable Lead Connection Status: 753985
Implantable Lead Implant Date: 20160323
Implantable Lead Location: 753860
Implantable Pulse Generator Implant Date: 20160323
Lead Channel Impedance Value: 360 Ohm
Lead Channel Pacing Threshold Amplitude: 0.75 V
Lead Channel Pacing Threshold Pulse Width: 0.5 ms
Lead Channel Sensing Intrinsic Amplitude: 12 mV
Lead Channel Setting Pacing Amplitude: 2.5 V
Lead Channel Setting Pacing Pulse Width: 0.5 ms
Lead Channel Setting Sensing Sensitivity: 0.5 mV
Pulse Gen Serial Number: 7255600
Zone Setting Status: 755011

## 2024-03-01 LAB — HEPATIC FUNCTION PANEL
ALT: 15 U/L (ref 0–53)
AST: 17 U/L (ref 0–37)
Albumin: 4.4 g/dL (ref 3.5–5.2)
Alkaline Phosphatase: 81 U/L (ref 39–117)
Bilirubin, Direct: 0.1 mg/dL (ref 0.0–0.3)
Total Bilirubin: 0.4 mg/dL (ref 0.2–1.2)
Total Protein: 7.1 g/dL (ref 6.0–8.3)

## 2024-03-01 NOTE — Patient Instructions (Addendum)
 ICD-10-CM   1. IPF (idiopathic pulmonary fibrosis) (HCC)  J84.112     2. Encounter for therapeutic drug monitoring  Z51.81     3. Diarrhea due to drug  K52.1     4. Centrilobular emphysema (HCC)  J43.2     5. Severe sleep apnea  G47.30     6. Exercise hypoxemia  R09.02     7. Screening for lung cancer  Z12.2         #Emphysema  - stable  Plan  - continue trelegy scheduled with albuteorl as needed  # Interstitial lung disease otherwise called pulmonary fibrosis # IPF -diagnosis given in January 2025 [based on the fact age greater than 65, previous smoking history, male gender, Caucasian ethnicity and also associated emphysema)  -Absent in 2018 started in 2019 and progressive.  -Risk factors included amiodarone 2021 and 2023 and COVID 2022  -Clinically stable -Tolerating pirfenidone well except for diarrhea  Plan - Take IPF-PRO registry consent  - thanks for intersst  - meet Carolyne Littles of [employer] to take cosnent -Do spirometry and DLCO in 2 months -Check liver function test today 03/01/2024 -Continue full dose aspirin per protocol  -You have to wear sunscreen with this medication  -Take 1 pill 3 times daily for 1 week and then go up to 2 pills 3 times daily for 1 other week and then go to full dose of 3 pills 3 times daily  -Always take with food  -Space medication at least 5-6 hours apart   #Diarrhea due to drug  Plan Take CAROB Flour OTC as follows Take 1 DESSERT spoon  size serving [approximately 7 g] before breakfast If still no response in 3 days then add another 7 g at dinner If still no response in 3 days then make it to spoon servings at breakfast and 2 spoon servings at dinner and hold     #Exercise hypoxemia and new diagnosis of sleep apnea 12/13/2023   -Continue to respect that you do not want to have portable oxygen or nighttime oxygen at this point. -Noted in January 2025 Dr. Carolanne Grumbling is recommended CPAP titration study. -I have counseled  the of the benefits of CPAP in the setting of heart failure and sleep apnea.  Plan  --I have sent a message to Dr. Carolanne Grumbling to explain further questions about CPAP titration study and nighttime oxygen use.  #Lung cancer screening -last CT scan September 2024  Plan  - We will capture information with a high-resolution CT chest for ILD in September 2025 [we will place this order and follow-up  Followup  - return in 7  weeks with DR  Marchelle Gearing post PFT; 30 min vsiit

## 2024-03-01 NOTE — Progress Notes (Signed)
 Synopsis: Referred in 2016 for evaluation of COPD. Has ischemic cardiomyopathy after a large MI. LVEF 25-30% as per echocardiogram in March 2016. December 2015 CT angiogram chest: No pulmonary embolism, mild centrilobular and paraseptal emphysema and upper lobe distribution bilaterally, small pleural effusion on the left, there is dependent groundglass and mild interlobular septal thickening bilaterally Spirometry as part of June 2016 cardiopulmonary exercise test: Ratio 73%, FEV1 2.4 L (61% predicted), FVC 3.27 L (66% predicted). 06/2015 CT chest HRCT> very mild patches of ground glass in the bases of both lungs with non-specific interstitial changes, no clear fibrotic change; scattered pulmonary nodules appear stable compared to 11/2014, emphysema and bronchial wall thickening noted July 2016 pulmonary function testing: Ratio 73% predicted, FEV1 2.42 L (59% predicted) FVC 3.33 L (61% predicted), total lung capacity 5.74 L (71% predicted), DLCO 15.23 (38% predicted), Hgb 13.5  = COPD "GOLD 0"  [place] imp/plan  06/25/15  Centrilobular emphysema  CT scan from July 2016. This showed mild centrilobular emphysema bilaterally. There was a question of some very mild nonspecific interstitial changes in the periphery of the right lung and some scant dependent changes in the left lung.  >>>follow  with at least one repeat CT scan in one year with a repeat lung function test.  Multiple pulmonary nodules He had a few scattered small pulmonary nodules. These have been stable on 2 separate images. The radiologist has recommended a repeat CT chest in one year.    Toward end of April 2024 nasal congestion/ muscle aches/sorethroat bad cough clear mucus rx  doxy/omnicef but really didn't turn the corner until prednisone rx    05/08/2023  New Pt eval/ Pulmonary ov /Wert re: COPD 0/ emphysema on and off amiodarone since 05/2020  maint on Trelegy since Feb 2024 from anoro while also back  on amiodarone daily  since 08/04/22  Chief Complaint  Patient presents with   Consult    SOB with exertion x 6 weeks.  Review CT scan 05/01/2023. Concern with taking amiodarone.   Dyspnea:  was walking 2.5 miles per day and stopped and now back to mile and a half and finished prednisone on 05/07/23 p 6 day Cough: loose clear  Sleeping: bed is flat/ one pillow  SABA use: none  02: none    No obvious day to day or daytime variability or assoc excess/ purulent sputum or mucus plugs or hemoptysis or cp or chest tightness, subjective wheeze or overt sinus or hb symptoms.   Sleeping  without nocturnal  or early am exacerbation  of respiratory  c/o's or need for noct saba. Also denies any obvious fluctuation of symptoms with weather or environmental changes or other aggravating or alleviating factors except as outlined above   No unusual exposure hx or h/o childhood pna/ asthma or knowledge of premature birth.         OV 10/08/2023 -transfer of care from Dr. Sandrea Hughs to Dr. Marchelle Gearing in the ILD center.  Referred by Dr. Arvilla Meres and Dr. Buren Kos primary care.  Subjective:  Patient ID: Kyle Bullock, male , DOB: 1951/10/30 , age 73 y.o. , MRN: [id number] , ADDRESS: [address] [place] [address] PCP Cleatis Polka., MD Patient Care Team: Cleatis Polka., MD as PCP - General (Internal Medicine) Lanier Prude, MD as PCP - Electrophysiology (Cardiology)  This Provider for this visit: Treatment Team:  Attending Provider: Kalman Shan, MD    10/08/2023 -  Chief Complaint  Patient presents with   Follow-up    Pt is here for F/U visit. Pt had PFT done 10-08-2023.     HPI Kyle Bullock 73 y.o. -history is provided by the patient, his wife and also review of the external medical record.  He is retired.  He is a wine salesman.  Now worked in [employer].  He is known to have ischemic cardiomyopathy and has a defibrillator.  He sees Dr. Steffanie Dunn for atrial  fibrillation.  He sees Dr. Arvilla Meres for chronic systolic heart failure all his problems started in December 2015 when he suffered VF arrest and status post STEMI with drug-eluting stent to the LAD December 2015.  This was in [place].  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  He has been a patient of Dr. Arvilla Meres per chart review since August 2022.  As of 2022 it was observed he had emphysema and no ILD.  He had seen Dr. Max Fickle in this pulmonary clinic.  His previous history of amiodarone was between June 2021 and October 2021.  Most recently he says that he status post ablation in the fall 2023.  Review of the records confirm this happen for atrial fibrillation on 08/04/2022.  He was then placed on amiodarone.  Then he said getting dyspneic in May 2024 he did have a high-resolution CT chest that showed ILD.  He saw Dr. Sandrea Hughs.  Subsequently placed on prednisone for 6 weeks by Dr. Lalla Brothers.  After the prednisone and discontinuation of the amiodarone he started feeling better but he still not back to his summer 2023 baseline although he is better compared to April 2023.  And his most recent visit with Dr. Arvilla Meres he said he was feeling better and walking to 20 miles per day 3 to 4 days/week.  In July 2024 he did have a cardiopulmonary stress test that showed a VO2 max of 15 mL/kg/min.  Unclear if desaturation test was done.  [In 2018 he had done 19 mL/kg/min] in addition in this 1 there was heavy dead space ventilation.  Review of his imaging shows that in my personal visualization and impression in 2018 all he had was emphysema in 2019 some ILD started showing up in terms of groundglass opacities by 2021 I think this reticulation and by 2022 there is definite ILD.  His most recent 2 scans of May 2024 and September 2024.  Alternative pattern is described by the radiologist.  I personally visualized it and agree with that.  So lower probably T4 UIP/IPF but he does  have associated emphysema.  For his emphysema he is on Trelegy  Current symptom scores as below   And walking desaturation test today he desaturated pretty fast he needed 4 L to correct.  We are discharging him on portable oxygen.   ILD symptom another questionnaire   Past medical history ILD relevant - History of amiodarone therapy present - Did have COVID in 2022  Family history of lung disease - Mother had COPD - Brother had asthma  Personal exposure history - Smoked heavily between 80 and 2015 1 pack/day. - No marijuana no cocaine no intravenous drug use  Personal exposure history - [place] age of the current home is 46 years.  He is lived there for the 30 years.  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  He used to distribute wine from wholesaler to retailer but he was now exposed to be needed by the grape leaves can have mold  Pulmonary toxicity history - History of amiodarone therapy twice.  Otherwise negative.        Narrative & Impression  CLINICAL DATA:  73 year old male with history of interstitial lung disease.   EXAM: CT CHEST WITHOUT CONTRAST   TECHNIQUE: Multidetector CT imaging of the chest was performed following the standard protocol without intravenous contrast. High resolution imaging of the lungs, as well as inspiratory and expiratory imaging, was performed.   RADIATION DOSE REDUCTION: This exam was performed according to the departmental dose-optimization program which includes automated exposure control, adjustment of the mA and/or kV according to patient size and/or use of iterative reconstruction technique.   COMPARISON:  High-resolution chest CT 05/01/2023.   FINDINGS: Cardiovascular: Heart size is borderline enlarged. There is no significant pericardial fluid, thickening or  pericardial calcification. There is aortic atherosclerosis, as well as atherosclerosis of the great vessels of the mediastinum and the coronary arteries, including calcified atherosclerotic plaque in the left main, left anterior descending, left circumflex and right coronary arteries. Left-sided pacemaker/AICD with lead tip terminating in the right ventricle.   Mediastinum/Nodes: No pathologically enlarged mediastinal or hilar lymph nodes. Please note that accurate exclusion of hilar adenopathy is limited on noncontrast CT scans. Esophagus is unremarkable in appearance. No axillary lymphadenopathy.   Lungs/Pleura: High-resolution images again demonstrate widespread but patchy areas of ground-glass attenuation, septal thickening, subpleural reticulation, cylindrical traction bronchiectasis, peripheral bronchiolectasis and some upper lung predominant honeycombing which appears minimally progressive compared to the prior examination. Inspiratory and expiratory imaging demonstrates some air trapping indicative of small airways disease. No acute consolidative airspace disease. No pleural effusions. Calcified granuloma in the periphery of the right lower lobe. A few scattered small pulmonary nodules are noted, largest of which is a sessile appearing pleural-based nodule in the posterior right upper lobe (axial image 37 of series 6) measuring 1.4 x 0.6 cm, stable. Other smaller pulmonary nodules are stable. No other larger more suspicious appearing pulmonary nodules or masses are noted. Diffuse bronchial wall thickening with mild to moderate centrilobular and paraseptal emphysema.   Upper Abdomen: Aortic atherosclerosis.   Musculoskeletal: There are no aggressive appearing lytic or blastic lesions noted in the visualized portions of the skeleton.   IMPRESSION: 1. Fibrotic changes in the lungs once again considered most compatible with an alternative diagnosis (not usual  interstitial pneumonia) per current ATS guidelines. Overall, given the presence of air trapping and upper lung predominant honeycombing, findings are once again favored to reflect chronic hypersensitivity pneumonitis. 2. Mild diffuse bronchial wall thickening with mild to moderate centrilobular and paraseptal emphysema; imaging findings suggestive of COPD. 3. Stable pulmonary nodules, likely benign. Continued attention at time of routine annual low-dose lung cancer screening chest CTs is recommended. 4. Aortic atherosclerosis, in addition to left main and three-vessel coronary artery disease. Assessment for potential risk factor modification, dietary therapy or pharmacologic therapy may be warranted, if clinically indicated.   Aortic Atherosclerosis (ICD10-I70.0) and Emphysema (ICD10-J43.9).     Electronically Signed   By: Trudie Reed M.D.   On: 08/27/2023 11:25       OV 12/15/2023  Subjective:  Patient ID: Kyle Bullock, male , DOB: November 11, 1951 , age 26 y.o. , MRN: [id number] , ADDRESS: [address] [place] [address] PCP Cleatis Polka., MD Patient Care Team: Cleatis Polka., MD as  PCP - General (Internal Medicine) Lanier Prude, MD as PCP - Electrophysiology (Cardiology)  This Provider for this visit: Treatment Team:  Attending Provider: Kalman Shan, MD    12/15/2023 -   Chief Complaint  Patient presents with   Follow-up    Pt states he has been good. Denies any concerns      HPI Kyle Bullock 73 y.o. -presents with his wife.  His ILD workup in progress.  Known chronic systolic heart failure since cardiac arrest in 2015.  Former smoker with known emphysema.   She is an independent shistorian.  She is a retired Engineer, civil (consulting).  At the last visit was the first intake consult for ILD.  Towards the end of the visit I was surprised that he had exercise hypoxemia and this required 4 L for correction.  Therefore recommendation for using  portable oxygen was made.  He he and his wife expressed dissatisfaction and the wait was communicated.  Apologized for that and took accountability.  Since then he had overnight pulse oximetry study.  This was abnormal.  Recommended nighttime oxygen but he has declined.  He did have a sleep study done on 12/13/2023.  He was not aware of the results to live mentioned to him.  This show she has severe sleep apnea AHI 50.5 obstructive and moderate central sleep apnea 22.9/h along with oxygen desaturations less than 88% for 41 minutes.  Otherwise in the interim he reports no change in his health status no admissions to the hospital no ER visits no urgent care visits.  He is here to discuss his workup so far.  He is hypersensitive pneumonitis panel autoimmune panel is all negative.  We discussed in case conference.  That the thinking was that in the upper lobes he had smoking-related interstitial fibrosis and in the lower lobes it was more consistent with probable UIP therefore raising the possibility of IPF.  He definitely has associated emphysema.  I again visualized the CT scans with him.  There was definitely some early ILD in 2016 but it is definitely progressed.   Overall explained to him that we he does have -Associated emphysema with pulmonary fibrosis -The pulmonary fibrosis is progressive -There is lack of clarity between radiologist as to the definitive pattern of pulmonary fibrosis which then suggest that he meets indication for lung biopsy but given his heart failure I would think the risk of lung biopsy is significant -Will give him a clinical diagnosis of IPF based on age, male gender, former smoking history, associate emphysema, progression and conference suggesting probable UIP in the bases. -Explained that if alternative etiologies such as amiodarone or high percent pneumonitis are consideration then steroids would be indicated.  He did recall that he did do a course of steroids with his of  amiodarone.  He did not like the side effects and it did not help him.  -Explained that antifibrotic's are indicated.  Went over pirfenidone and nintedanib.  Given his previous cardiac history and the fact he is on anticoagulation do not recommend nintedanib other than being a second line.  Recommend pirfenidone.  Went over details of the drug and the insurance approval process.  He understood   -Also explained the rationale for oxygen.  Particularly with improvement in quality of life.  At this point in time he is not keen on portable oxygen.  However he is agreed to follow-up with the sleep specialist      OV 03/01/2024  Subjective:  Patient ID: Kyle Ha  Dub Bullock, male , DOB: May 07, 1951 , age 84 y.o. , MRN: [id number] , ADDRESS: [address] [place] [address] PCP Cleatis Polka., MD Patient Care Team: Cleatis Polka., MD as PCP - General (Internal Medicine) Lanier Prude, MD as PCP - Electrophysiology (Cardiology)  This Provider for this visit: Treatment Team:  Attending Provider: Kalman Shan, MD    03/01/2024 -   Chief Complaint  Patient presents with   Follow-up    Breathing has improved some. He is max dose esbriet- has been having diarrhea but his appetite ok and no vomiting.     IPF diagnosed January 2025 and started on pirfenidone Known chronic systolic heart failure since cardiac arrest in 2015.   Former smoker with known emphysema. Sleep apnea severe obstructive and moderate central early 2025 diagnosed by Dr. Carolanne Grumbling Exertional hypoxemia nocturnal hypoxemia due to all of the above.   Esbriet/Pirfenidone requires intensive drug monitoring due to high concerns for Adverse effects of , including  Drug Induced Liver Injury, significant GI side effects that include but not limited to Diarrhea, Nausea, Vomiting,  and other system side effects that include Fatigue, headaches, weight loss and other side effects such as skin rash. These will be  monitored with  blood work such as LFT initially once a month for 6 months and then quarterly   HPI Kyle Bullock 73 y.o. -presents with his wife.  Issues at this visit  #Symptoms: These are slightly better according to him after he started pirfenidone.  #Pirfenidone uptake currently been on it for 6 weeks approximately.  He is on full dose tolerating it well except he has mild diarrhea increased.  #Therapeutic monitoring because of high risk medication -pirfenidone requiring intensive therapeutic monitoring: He is not having any nausea vomiting or diminished appetite or weight loss which are classic side effects.  Not having headache and fatigue.  Instead he is having increased diarrhea compared to baseline [see symptom score below].  It happens 3-4 times a week particularly after lunch.  He does take some fiber early in the morning but does not drink coffee other than the morning.  He has not had incontinence but he is able to go to the bathroom it is loose and foul-smelling but there is no tenesmus.  No bloody diarrhea.  He has not tried Imodium which is adequate.  Also recommended natural product CArob  #Emphysema: Stable on Trelegy  # IPF: Currently clinically stable.  We discussed IPF pro registry protocol and he is interested.  I called the research team in the EMR consent.  #Nocturnal hypoxemia and sleep apnea: Review of the records and according to history indicate that he has been recommended a CPAP titration study.  His wife states that she never had 1 and they wanted to know the indications for this.  Is also wondering about using night oxygen.  Have sent a message to Dr. Carolanne Grumbling to help answer these questions for him.   #Lung cancer screening: He was getting low-dose CT scan screening with Dr. Buren Kos his primary care physician.  Now he will have high-resolution CT chest.    SYMPTOM SCALE - ILD 10/08/2023 12/15/2023 IPF new dx 03/01/2024 Esbriet full does  Current  weight     O2 use ra ra ra  Shortness of Breath 0 -> 5 scale with 5 being worst (score 6 If unable to do)    At rest 1 0 0  Simple tasks - showers,  clothes change, eating, shaving 1 0 2  Household (dishes, doing bed, laundry) 2 2 2   Shopping 2 1 1   Walking level at own pace 2 1 2   Walking up Stairs 2 2 2   Total (30-36) Dyspnea Score 10 6 9   How bad is your cough? 0 1 2  How bad is your fatigue 1 1 2   How bad is nausea 0 0 0  How bad is vomiting?  0 0 0  How bad is diarrhea? 0 1 3 wprse  How bad is anxiety? 1 1 2   How bad is depression 1 1 2   Any chronic pain - if so where and how bad 1    0 2    Simple office walk 224 (66+46 x 2) feet Pod A at [facility] x  3 laps goal with forehead probe 10/08/2023  10/08/2023   O2 used ra ra  Number laps completed Sit stand x 4 time Half laps  Comments about pace Good pac   Resting Pulse Ox/HR 98% and 72/min 97%   Final Pulse Ox/HR 83% and 98/min 85% in just half la  Desaturated </= 88% TES   Desaturated <= 3% points no   Got Tachycardic >/= 90/min yes   Symptoms at end of test duyspnea Needed 4L [place] to correct  to wal 2 LAps  Miscellaneous comments x       PFT     Latest Ref Rng & Units 10/08/2023   10:48 AM 06/22/2018    1:52 PM 06/14/2015   10:58 AM  PFT Results  FVC-Pre L 3.15  3.55  3.33   FVC-Predicted Pre % 60  66  61   FVC-Post L 3.18  3.58    FVC-Predicted Post % 61  66    Pre FEV1/FVC % % 77  72  73   Post FEV1/FCV % % 79  68    FEV1-Pre L 2.43  2.55  2.42   FEV1-Predicted Pre % 64  64  59   FEV1-Post L 2.52  2.42    DLCO uncorrected ml/min/mmHg 11.94  16.18  15.23   DLCO UNC% % 40  41  38   DLCO corrected ml/min/mmHg 11.94     DLCO COR %Predicted % 40     DLVA Predicted % 72  64  61   TLC L 4.21  5.55  5.74   TLC % Predicted % 52  69  71   RV % Predicted % 43  71  90        LAB RESULTS last 96 hours No results found.       has a past medical history of Abnormal TSH, AICD (automatic  cardioverter/defibrillator) present (02/28/2015), CAD (coronary artery disease), CHF (congestive heart failure) (HCC), Dressler's syndrome (HCC), Dyslipidemia, Dyspnea, Erectile dysfunction, Hypertension, Ischemic cardiomyopathy, NSVT (nonsustained ventricular tachycardia) (HCC) (11/16/14), Pericardial effusion, Pericarditis (dx'd 11/2014), Pneumonia (1990's X 2), RBBB, Sleep apnea, STEMI (ST elevation myocardial infarction) (HCC) (11/10/2014), Tobacco abuse, and VF (ventricular fibrillation) (HCC) (11/10/14).   reports that he quit smoking about 9 years ago. His smoking use included cigarettes. He started smoking about 49 years ago. He has a 80 pack-year smoking history. He has never used smokeless tobacco.  Past Surgical History:  Procedure Laterality Date   A-FLUTTER ABLATION N/A 09/04/2020   Procedure: A-FLUTTER ABLATION;  Surgeon: Hillis Range, MD;  Location: [facility] INVASIVE CV LAB;  Service: Cardiovascular;  Laterality: N/A;   ATRIAL FIBRILLATION ABLATION N/A 08/04/2022   Procedure:  ATRIAL FIBRILLATION ABLATION;  Surgeon: Lanier Prude, MD;  Location: [facility] INVASIVE CV LAB;  Service: Cardiovascular;  Laterality: N/A;   CARDIAC CATHETERIZATION  11/19/2014   CARDIAC DEFIBRILLATOR PLACEMENT  02/28/2015   COLONOSCOPY WITH PROPOFOL N/A 03/16/2023   Procedure: COLONOSCOPY WITH PROPOFOL;  Surgeon: Imogene Burn, MD;  Location: [facility] ENDOSCOPY;  Service: Gastroenterology;  Laterality: N/A;   CORONARY ANGIOPLASTY WITH STENT PLACEMENT  11/10/2014   LAD DES   HAND SURGERY Bilateral    Dupuytren's Contracture   IMPLANTABLE CARDIOVERTER DEFIBRILLATOR IMPLANT N/A 02/28/2015   SJM Fortify Assura VR ICD implanted by Dr Johney Frame.  Revascularized following VF arrest, EF remained depressed and ICD implanted   KNEE ARTHROSCOPY Right 12/08/1980   LEFT HEART CATH N/A 11/19/2014   Procedure: LEFT HEART CATH;  Surgeon: Runell Gess, MD;  Location: [facility] CATH LAB;  Service: Cardiovascular;  Laterality: N/A;   POLYPECTOMY   03/16/2023   Procedure: POLYPECTOMY;  Surgeon: Imogene Burn, MD;  Location: [facility] ENDOSCOPY;  Service: Gastroenterology;;   RIGHT/LEFT HEART CATH AND CORONARY ANGIOGRAPHY N/A 07/22/2021   Procedure: RIGHT/LEFT HEART CATH AND CORONARY ANGIOGRAPHY;  Surgeon: Dolores Patty, MD;  Location: [facility] INVASIVE CV LAB;  Service: Cardiovascular;  Laterality: N/A;    Allergies  Allergen Reactions   Ambien [Zolpidem Tartrate] Anxiety and Other (See Comments)    Causes nightmares   Lisinopril Cough   Brilinta [Ticagrelor] Cough    ? Dyspnea. Changed to Effient 11/2014.    Immunization History  Administered Date(s) Administered   DTaP, 5 pertussis antigens 12/14/2015   Influenza, High Dose Seasonal PF 09/16/2015, 08/30/2017   Influenza, Quadrivalent, Recombinant, Inj, Pf 09/04/2018, 08/27/2019, 09/08/2020, 09/14/2021, 09/26/2023   Influenza-Unspecified 08/17/2014, 09/20/2016   PFIZER(Purple Top)SARS-COV-2 Vaccination 12/28/2019, 01/16/2020   PNEUMOCOCCAL CONJUGATE-20 02/12/2023   Pneumococcal Conjugate-13 07/26/2015   Pneumococcal Polysaccharide-23 11/13/2014, 12/18/2016   Zoster Recombinant(Shingrix) 07/02/2019, 11/10/2019    Family History  Problem Relation Age of Onset   COPD Mother    Hypertension Mother    Heart disease Mother    Hypertension Father    Prostate cancer Father    Crohn's disease Brother    CAD Neg Hx    Colon cancer Neg Hx    Stomach cancer Neg Hx    Esophageal cancer Neg Hx      Current Outpatient Medications:    acetaminophen (TYLENOL) 500 MG tablet, Take 500 mg by mouth every 8 (eight) hours as needed for moderate pain., Disp: , Rfl:    atorvastatin (LIPITOR) 20 MG tablet, TAKE 1 TABLET BY MOUTH DAILY, Disp: 90 tablet, Rfl: 3   cyanocobalamin (VITAMIN B12) 1000 MCG tablet, Take 1,000 mcg by mouth daily., Disp: , Rfl:    ELIQUIS 5 MG TABS tablet, TAKE 1 TABLET(5 MG) BY MOUTH TWICE DAILY, Disp: 60 tablet, Rfl: 5   furosemide (LASIX) 20 MG tablet, TAKE 1 TABLET BY  MOUTH DAILY AS NEEDED, Disp: 30 tablet, Rfl: 2   levothyroxine (SYNTHROID) 112 MCG tablet, Take 112 mcg by mouth daily before breakfast., Disp: , Rfl:    metoprolol succinate (TOPROL-XL) 25 MG 24 hr tablet, Take 1 tablet (25 mg total) by mouth at bedtime., Disp: 90 tablet, Rfl: 3   nitroGLYCERIN (NITROSTAT) 0.4 MG SL tablet, PLACE 1 TABLET UNDER THE TONGUE EVERY 5 MINUTES AS NEEDED FOR CHEST PAIN, Disp: 25 tablet, Rfl: 3   omega-3 acid ethyl esters (LOVAZA) 1 g capsule, Take 2 g by mouth 2 (two) times daily., Disp: , Rfl:  Pirfenidone 267 MG TABS, Take 3 tablets (801 mg total) by mouth 3 (three) times daily with meals., Disp: 270 tablet, Rfl: 4   Probiotic Product (PROBIOTIC PO), Take 1 tablet by mouth every other day., Disp: , Rfl:    sacubitril-valsartan (ENTRESTO) 24-26 MG, Take 1 tablet by mouth 2 (two) times daily., Disp: 180 tablet, Rfl: 3   spironolactone (ALDACTONE) 25 MG tablet, Take 1 tablet (25 mg total) by mouth daily., Disp: 30 tablet, Rfl: 11   TRELEGY ELLIPTA 100-62.5-25 MCG/ACT AEPB, Take 1 puff by mouth daily., Disp: , Rfl:       Objective:   Vitals:   03/01/24 1020  BP: 102/62  Pulse: 61  SpO2: 96%  Weight: 214 lb 9.6 oz (97.3 kg)  Height: 6\' 3"  (1.905 m)    Estimated body mass index is 26.82 kg/m as calculated from the following:   Height as of this encounter: 6\' 3"  (1.905 m).   Weight as of this encounter: 214 lb 9.6 oz (97.3 kg).  @WEIGHTCHANGE @  [employer]   03/01/24 1020  Weight: 214 lb 9.6 oz (97.3 kg)     Physical Exam   General: No distress. Look well O2 at rest: no Cane present: no Sitting in wheel chair: no Frail: no Obese: no Neuro: Alert and Oriented x 3. GCS 15. Speech normal Psych: Pleasant Resp:  Barrel Chest - no.  Wheeze - no, Crackles - no, No overt respiratory distress CVS: Normal heart sounds. Murmurs - no Ext: Stigmata of Connective Tissue Disease - no HEENT: Normal upper airway. PEERL +. No post nasal drip         Assessment:       ICD-10-CM   1. IPF (idiopathic pulmonary fibrosis) (HCC)  Z61.096 Pulmonary function test    Hepatic function panel    Hepatic function panel    2. Encounter for therapeutic drug monitoring  Z51.81 Pulmonary function test    Hepatic function panel    Hepatic function panel    3. Diarrhea due to drug  K52.1 Pulmonary function test    Hepatic function panel    Hepatic function panel    4. Centrilobular emphysema (HCC)  J43.2 Pulmonary function test    Hepatic function panel    Hepatic function panel    5. Severe sleep apnea  G47.30 Pulmonary function test    Hepatic function panel    Hepatic function panel    6. Exercise hypoxemia  R09.02 Pulmonary function test    Hepatic function panel    Hepatic function panel    7. Screening for lung cancer  Z12.2 Pulmonary function test    Hepatic function panel    Hepatic function panel         Plan:     Patient Instructions     ICD-10-CM   1. IPF (idiopathic pulmonary fibrosis) (HCC)  J84.112     2. Encounter for therapeutic drug monitoring  Z51.81     3. Diarrhea due to drug  K52.1     4. Centrilobular emphysema (HCC)  J43.2     5. Severe sleep apnea  G47.30     6. Exercise hypoxemia  R09.02     7. Screening for lung cancer  Z12.2         #Emphysema  - stable  Plan  - continue trelegy scheduled with albuteorl as needed  # Interstitial lung disease otherwise called pulmonary fibrosis # IPF -diagnosis given in January 2025 [based on the fact age greater  than 65, previous smoking history, male gender, Caucasian ethnicity and also associated emphysema)  -Absent in 2018 started in 2019 and progressive.  -Risk factors included amiodarone 2021 and 2023 and COVID 2022  -Clinically stable -Tolerating pirfenidone well except for diarrhea  Plan - Take IPF-PRO registry consent  - thanks for intersst  - meet Carolyne Littles of [facility] to take cosnent -Do spirometry and DLCO in 2 months -Check liver  function test today 03/01/2024 -Continue full dose aspirin per protocol  -You have to wear sunscreen with this medication  -Take 1 pill 3 times daily for 1 week and then go up to 2 pills 3 times daily for 1 other week and then go to full dose of 3 pills 3 times daily  -Always take with food  -Space medication at least 5-6 hours apart   #Diarrhea due to drug  Plan Take CAROB Flour OTC as follows Take 1 DESSERT spoon  size serving [approximately 7 g] before breakfast If still no response in 3 days then add another 7 g at dinner If still no response in 3 days then make it to spoon servings at breakfast and 2 spoon servings at dinner and hold     #Exercise hypoxemia and new diagnosis of sleep apnea 12/13/2023   -Continue to respect that you do not want to have portable oxygen or nighttime oxygen at this point. -Noted in January 2025 Dr. Carolanne Grumbling is recommended CPAP titration study. -I have counseled the of the benefits of CPAP in the setting of heart failure and sleep apnea.  Plan  --I have sent a message to Dr. Carolanne Grumbling to explain further questions about CPAP titration study and nighttime oxygen use.  #Lung cancer screening -last CT scan September 2024  Plan  - We will capture information with a high-resolution CT chest for ILD in September 2025 [we will place this order and follow-up  Followup  - return in 7  weeks with DR  Marchelle Gearing post PFT; 30 min vsiit   FOLLOWUP Return in about 8 weeks (around 04/26/2024) for 30 min visit, after Cleda Daub and DLCO, with Dr Marchelle Gearing, Face to Face Visit, ILD.  ( Level 05 visit E&M 2024: Estb >= 40 min   in  visit type: on-site physical face to visit  in total care time and counseling or/and coordination of care by this undersigned MD - Dr Kalman Shan. This includes one or more of the following on this same day 03/01/2024: pre-charting, chart review, note writing, documentation discussion of test results, diagnostic or treatment  recommendations, prognosis, risks and benefits of management options, instructions, education, compliance or risk-factor reduction. It excludes time spent by the CMA or office staff in the care of the patient. Actual time 40 min)   SIGNATURE    Dr. Kalman Shan, M.D., F.C.C.P,  Pulmonary and Critical Care Medicine Staff Physician, [facility] Director - Interstitial Lung Disease  Program  Pulmonary Fibrosis [facility] at [facility] [place], [place], [address]  Pager: [phone], If no answer or between  15:00h - 7:00h: call [phone] Telephone: [phone]  12:15 PM 03/01/2024

## 2024-03-01 NOTE — Telephone Encounter (Signed)
 [name]  Please tell the patient that CPAP titration is required because of a mixture of bad central and obstructive sleep apnea.  Further conversation he should have with Dr. Mayford Knife when he visits with her in the office.  I do sTRONGLY recommend that he go through her recommendations.  The issue of nighttime oxygen will also be sorted out during the sleep study titration test.  THanks

## 2024-03-01 NOTE — Telephone Encounter (Signed)
   Hi Traci  Re: Kyle Bullock   Saw patient.  He was accompanied by his wife.  They had questions about the recommendations for CPAP titration  1) why does  he needs CPAP titration?Marland Kitchen  Apparently wife also has sleep apnea and she never went to the sleep lab so they tried understand the indication for CPAP titration  2) also he does have overnight pulse oximetry desaturation.  They are wondering if after the CPAP titration whether he will have recommendations for nighttime oxygen bleed and also if this will be part of the CPAP titration.  He does have ILD and therefore I think he will benefit from nighttime oxygen although he has been reluctant to use it

## 2024-03-02 NOTE — Telephone Encounter (Signed)
 I called and spoke with the pt and notified of response per Dr Marchelle Gearing  He verbalized understanding  Nothing further needed

## 2024-03-03 ENCOUNTER — Encounter: Payer: Self-pay | Admitting: Internal Medicine

## 2024-03-03 NOTE — Progress Notes (Signed)
LFT normal

## 2024-03-04 ENCOUNTER — Telehealth: Payer: Self-pay | Admitting: *Deleted

## 2024-03-04 DIAGNOSIS — G4733 Obstructive sleep apnea (adult) (pediatric): Secondary | ICD-10-CM

## 2024-03-04 DIAGNOSIS — R0683 Snoring: Secondary | ICD-10-CM

## 2024-03-04 DIAGNOSIS — I2510 Atherosclerotic heart disease of native coronary artery without angina pectoris: Secondary | ICD-10-CM

## 2024-03-04 NOTE — Telephone Encounter (Signed)
 2/25-READY-valid dates: 02/02/2024-05/02/2024. Outpatient Authorization [id number] If CPAP fails move to BIPAP. LV Left a message to schedule the appt. 02/10/24 TP.;pt stated he doesn't know why study is being ordered, he stated he didn't wish to schedule the appt. 02/10/24 TP.

## 2024-03-04 NOTE — Telephone Encounter (Signed)
 Per Dr Mayford Knife, He had very Severe Obstructive Sleep Apnea with AHI 50.5/hr and Moderate Central Sleep Apnea with pAHIc 22.9/hr with nocturnal hypoxemia.  Given severity of his OSA and also CSA we need an in lab titration as he has complex OSA and will be harder to titrate.  Will precert titration

## 2024-03-05 ENCOUNTER — Encounter: Payer: Self-pay | Admitting: Cardiology

## 2024-03-08 NOTE — Telephone Encounter (Signed)
 Reached out to patient and gave him the number to the sleep lab to get scheduled. Pt is agreeable to treatment.

## 2024-03-11 ENCOUNTER — Telehealth: Payer: Self-pay

## 2024-03-11 NOTE — Addendum Note (Signed)
**Note De-Identified Hubert Raatz Obfuscation** Encounter addended by: Demetrios Loll, LPN on: 12/13/1094 3:10 PM  Actions taken: Flowsheet accepted

## 2024-03-11 NOTE — Telephone Encounter (Signed)
**Note De-Identified Tanyah Debruyne Obfuscation** Per the sleep lab, an extension is needed on the service dates for the pts CPAP Titration PA as the pts last PA will end on 05/02/24 and they are scheduling into June now.  I started a CPAP Titration PA through [facility] provider portal and it is currently pending review. Authorization #: [id number]

## 2024-03-16 ENCOUNTER — Encounter: Admitting: Internal Medicine

## 2024-03-16 DIAGNOSIS — Z006 Encounter for examination for normal comparison and control in clinical research program: Secondary | ICD-10-CM

## 2024-03-16 DIAGNOSIS — J84112 Idiopathic pulmonary fibrosis: Secondary | ICD-10-CM

## 2024-03-16 NOTE — Research (Signed)
 Title: Idiopathic Pulmonary Fibrosis Prospective Outcomes (IPF-PRO) Registry    Protocol #: IPF-PRO-Expansion, Clinical Trials # [id number], Sponsor: [employer]   Protocol Version Amendment 6 dated 30Apr2024  and confirmed current on 10Sep2024 Consent Version for today's visit date of  Is [facility] [employer] Approved Version 22May2024 Revised 29Jul2024   Objectives:  Describe current approaches to diagnosis and treatment of Idiopathic Pulmonary Fibrosis   Describe the natural history of Idiopathic Pulmonary Fibrosis  Assess quality of life from self-administered participant reported questionnaires for each disease group  Describe participant interactions with the healthcare system, describe treatment practices across multiple institutions for each disease group  Collect biological samples linked to well characterized Idiopathic Pulmonary Fibrosis to identify disease biomarkers  Collect data and biological samples that will support future research studies.                                            Key Inclusion Criteria: Willing and able to provide informed consent  Age >= 30 years  Established a new diagnosis (within 12 months) of IPF by the enrolling center    Key Exclusion Criteria: Malignancy, treated or untreated, other than skin or early stage prostate cancer, within the past 5 years  Currently listed for lung transplantation at the time of enrollment  Currently enrolled in a clinical trial at the time of enrollment in this registry    Clinical Research Coordinator / Research RN note : This visit for  Subject [id number] with DOB: 04-Mar-1951 on 09Apr2025 for the above protocol is Visit/Encounter Baseline and is for purpose of research.    Subject expressed continued interest and consent in continuing as a study subject. Subject confirmed that there was no change in contact information (e.g. address, telephone, email). Subject thanked for participation in research and  contribution to science.     During this visit on, the subject completed the blood work and enrollment forms per the above referenced protocol. Please refer to the subject's paper source binder for further details.   Signed by Despina Hick Clinical Research Coordinator [facility]  [place], [place]

## 2024-03-18 ENCOUNTER — Other Ambulatory Visit: Payer: Self-pay | Admitting: Cardiology

## 2024-03-18 DIAGNOSIS — I4891 Unspecified atrial fibrillation: Secondary | ICD-10-CM

## 2024-03-18 NOTE — Telephone Encounter (Signed)
 Prescription refill request for Eliquis received. Indication:afib Last office visit:1/25 Scr:1.25  1/25 Age: 73 Weight:97.3  kg  Prescription refilled

## 2024-03-21 NOTE — Telephone Encounter (Signed)
**Note De-Identified Samie Barclift Obfuscation** Letter received Kyle Bullock fax from [facility] stating that they have approved the pts CPAP Titration PA from 03/11/2024 until 06/09/2024. Auth #: [id number].  I have transferred the order to the sleep lab so they can contact the pt to schedule test.

## 2024-03-22 ENCOUNTER — Telehealth: Payer: Self-pay

## 2024-03-22 NOTE — Telephone Encounter (Signed)
 Copied from CRM [id number]. Topic: Clinical - Medication Question >> Mar 21, 2024 11:44 AM Alverda Joe S wrote: Reason for CRM: patient calling to ask about carob powder and how should he take it in order to reduce side effects of other medication prescribed by dr. Bertrum Brodie. Please call patient with assistance  Spoke with patient regarding prior message. Patient stated he has taken the Carob flour this morning with 2 cups of water and it seemed to help.Patient stated he would contact the office if needed .  Patient's voice was understanding.Nothing else further needed.

## 2024-03-24 ENCOUNTER — Ambulatory Visit: Admitting: Internal Medicine

## 2024-03-24 ENCOUNTER — Encounter: Payer: Self-pay | Admitting: Student

## 2024-03-24 ENCOUNTER — Encounter: Payer: Self-pay | Admitting: Nurse Practitioner

## 2024-03-24 DIAGNOSIS — R0600 Dyspnea, unspecified: Secondary | ICD-10-CM | POA: Diagnosis not present

## 2024-03-24 DIAGNOSIS — R0902 Hypoxemia: Secondary | ICD-10-CM

## 2024-03-24 DIAGNOSIS — J84112 Idiopathic pulmonary fibrosis: Secondary | ICD-10-CM

## 2024-03-24 DIAGNOSIS — Z5181 Encounter for therapeutic drug level monitoring: Secondary | ICD-10-CM

## 2024-03-24 DIAGNOSIS — Z122 Encounter for screening for malignant neoplasm of respiratory organs: Secondary | ICD-10-CM

## 2024-03-24 DIAGNOSIS — J432 Centrilobular emphysema: Secondary | ICD-10-CM

## 2024-03-24 DIAGNOSIS — K521 Toxic gastroenteritis and colitis: Secondary | ICD-10-CM

## 2024-03-24 DIAGNOSIS — G4730 Sleep apnea, unspecified: Secondary | ICD-10-CM

## 2024-03-24 LAB — PULMONARY FUNCTION TEST
DL/VA % pred: 69 %
DL/VA: 2.73 ml/min/mmHg/L
DLCO unc % pred: 40 %
DLCO unc: 11.76 ml/min/mmHg
FEF 25-75 Pre: 2.23 L/s
FEF2575-%Pred-Pre: 80 %
FEV1-%Pred-Pre: 65 %
FEV1-Pre: 2.46 L
FEV1FVC-%Pred-Pre: 106 %
FEV6-%Pred-Pre: 65 %
FEV6-Pre: 3.17 L
FEV6FVC-%Pred-Pre: 105 %
FVC-%Pred-Pre: 61 %
FVC-Pre: 3.17 L
Pre FEV1/FVC ratio: 77 %
Pre FEV6/FVC Ratio: 100 %

## 2024-03-24 NOTE — Progress Notes (Signed)
Spiro and DLCO performed today. 

## 2024-03-29 ENCOUNTER — Other Ambulatory Visit: Payer: Self-pay

## 2024-03-31 ENCOUNTER — Other Ambulatory Visit: Payer: Self-pay | Admitting: Pharmacy Technician

## 2024-03-31 ENCOUNTER — Other Ambulatory Visit: Payer: Self-pay

## 2024-03-31 NOTE — Progress Notes (Signed)
 Specialty Pharmacy Refill Coordination Note  Kyle Bullock is a 73 y.o. male contacted today regarding refills of specialty medication(s) Pirfenidone    Patient requested (Patient-Rptd) Delivery   Delivery date: (Patient-Rptd) 04/07/24   Verified address: (Patient-Rptd) [address], [place], [place]. [address]   Medication will be filled on 04/06/24.

## 2024-04-05 ENCOUNTER — Ambulatory Visit (HOSPITAL_COMMUNITY)
Admission: RE | Admit: 2024-04-05 | Discharge: 2024-04-05 | Disposition: A | Discharge status: Home / Self Care | Source: Ambulatory Visit | Attending: Internal Medicine | Admitting: Internal Medicine

## 2024-04-05 ENCOUNTER — Encounter (HOSPITAL_COMMUNITY): Payer: Self-pay | Admitting: Internal Medicine

## 2024-04-05 VITALS — BP 110/62 | HR 60 | Ht 75.0 in | Wt 211.2 lb

## 2024-04-05 DIAGNOSIS — I472 Ventricular tachycardia, unspecified: Secondary | ICD-10-CM | POA: Diagnosis not present

## 2024-04-05 DIAGNOSIS — J849 Interstitial pulmonary disease, unspecified: Secondary | ICD-10-CM | POA: Diagnosis not present

## 2024-04-05 DIAGNOSIS — I5022 Chronic systolic (congestive) heart failure: Secondary | ICD-10-CM | POA: Diagnosis not present

## 2024-04-05 DIAGNOSIS — Z95810 Presence of automatic (implantable) cardiac defibrillator: Secondary | ICD-10-CM

## 2024-04-05 DIAGNOSIS — I2510 Atherosclerotic heart disease of native coronary artery without angina pectoris: Secondary | ICD-10-CM

## 2024-04-05 LAB — BRAIN NATRIURETIC PEPTIDE: B Natriuretic Peptide: 778.7 pg/mL — ABNORMAL HIGH (ref 0.0–100.0)

## 2024-04-05 LAB — BASIC METABOLIC PANEL WITH GFR
Anion gap: 9 (ref 5–15)
BUN: 20 mg/dL (ref 8–23)
CO2: 26 mmol/L (ref 22–32)
Calcium: 9.4 mg/dL (ref 8.9–10.3)
Chloride: 103 mmol/L (ref 98–111)
Creatinine, Ser: 1.19 mg/dL (ref 0.61–1.24)
GFR, Estimated: >60 mL/min (ref >60)
Glucose, Bld: 95 mg/dL (ref 70–99)
Potassium: 4.7 mmol/L (ref 3.5–5.1)
Sodium: 138 mmol/L (ref 135–145)

## 2024-04-05 NOTE — Progress Notes (Signed)
 Patient ID: Kyle Bullock, male   DOB: 1951/02/12, 73 y.o.   MRN: [id number]    Advanced Heart Failure Clinic Note    History of Present Illness:  Kyle Bullock is a 73 y.o. male who is referred to the HF Clinic by Dr. Arlester Ladd. He has h/o CAD and ischemic cardiomyopathy 20-25%.  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    In December 2015, he suffered an acute anterolateral MI complicated by VF arrest in [place] . He underwent drug-eluting stent placement to the LAD and PTCA of the diagonal. EF was 25% and a life vest was placed prior to discharge. Following return to [place], he developed dyspnea and presented to [facility] where he was readmitted. His Brilinta  was switched to Effient  as it was felt the Brilinta  may have been playing a role in his dyspnea. He was also switched from an ACE inhibitor to an ARB. He was seen by electrophysiology with recommendation for ongoing life vest therapy and follow-up echo in 90 days post revascularization. He was then seen in clinic in mid December where he reported pleuritic chest pain. He was readmitted and underwent diagnostic catheterization revealing patency of previously placed stent and area of balloon angioplasty in the diagonal. EF was 20-25% with diffuse hypokinesis and normal pericardial appearance. He did have some hypotension requiring discontinuation of ARB therapy. His beta blocker was also down titrated. He was diagnosed with Dressler's syndrome and was placed on prednisone  taper and colchicine .  CT with COPD. Referred to Dr. Elverna Hamman who felt he had mild COPD but no ILD. Given PRN albuterol . Has repeat CT scheduled for 1 year.   Had ATP in 7/22. Found on device interrogation. Seen by EP. According to EP device showed initially irregular arrhythmia concerning for Afib. He then developed a regular tachy arrhythmia (likely VT) which was terminated with ATP. After ATP, his rhythm was again regular. Zio placed showed SVT with  some ectopy. No VT or AF.   Cath in 8/22 for CP with stable CAD.   Had ICD shock 12/29 while he was stacking wood and carrying large loads, when he received an ICD shock. Seen by EP. Felt to have AF/AFL possibly leading to VT. Zio placed showing 5% AF/AFL. Started on amio. Eliquis  resumed.   Echo 1/23 EF 25-30%   Had AF ablation 08/04/22.   In May 2024. Hi-res CT: + ILD. Saw Dr. Waymond Hailey and Lambert amio stopped. Started prednisone   Echo 10/24 EF 25-30% RV mildly reduced Personally reviewed  Returns for f/u with his wife. Has been following with Dr. Bertrum Brodie for ILD. Now on pirfenidone . Still with DOE but improved. Walking 1.5 miles at slow pace. Working in yard. No CP, edema, orthopnea or PND. Compliant with meds. No bleeding with Eliquis . Overnight sleep study on Monday.   CPX 07/06/23  FVC 3.02 (62%)      FEV1 2.41 (66%)        FEV1/FVC 80 (106%)        MVV 119 (84%)   BP rest: 94/60 Standing BP: 90/62 BP peak: 132/60  Peak VO2: 15.0 (57% predicted peak VO2)  VE/VCO2 slope:  44  OUES: 1.63  Peak RER: 1.12    Cardiac studies:   Cath 8/22   Prox RCA to Mid RCA lesion is 55% stenosed.   1st Mrg lesion is 30% stenosed.   Ost LAD to Prox LAD lesion is 30% stenosed.   Previously placed Prox LAD to Dist LAD stent (  unknown type) is  widely patent.   Findings:   Ao = 104/57 (77)  LV = 107/7 RA =  3 RV = 23/1 PA = 21/4 (10) PCW = 3 Fick cardiac output/index = 6.0/2.7 PVR = 1.2 WU Ao sat = 99% PA sat = 74%, 74%   Assessment: CAD with widely patent LAD stent Non-obstructive CAD in RCA and LCX with ~ 60% lesion throughout [facility] ICM with EF 25% Low filling pressures with preserved CO    Echo 3/16 EF 25-30% Echo 03/2016 EF 20-25% Echo 9/18 EF 20-25% RV normal   CPX 2/18 FVC 3.49 (72%)      FEV1 2.49 (68%)        FEV1/FVC 71 (93%)        MVV 117 (82%) Resting HR: 62 Peak HR: 148   (97% age predicted max HR) BP rest: 84/60 BP peak: 156/62 Peak VO2: 19.6 (69%  predicted peak VO2) VE/VCO2 slope:  37 OUES: 2.14 Peak RER: 1.06 Ventilatory Threshold: 17.4 (61% predicted or measured peak VO2) VE/MVV:  66% O2pulse:  12   (71% predicted O2pulse)  CPX 05/14/2015  Peak VO2: 19.0 (64.5% predicted peak VO2) VE/VCO2 slope:  41.5 OUES: 2.01 Peak RER: 1.06 Ventilatory Threshold: 14.1          (47.9% predicted or measured peak VO2)   Past Medical History:  Diagnosis Date   Abnormal TSH    a. 11/2014: felt d/t sick euthyroid.   AICD (automatic cardioverter/defibrillator) present 02/28/2015   CAD (coronary artery disease)    a. 11/2014: arrest/STEMI s/p Xience DES to the LAD and PTCA to the D1 [place]). b. Relook cath 11/2014: no culprit, patent stent.   CHF (congestive heart failure) (HCC)    Dressler's syndrome (HCC)    a. 11/2014: tx with colchicine  and prednisone .   Dyslipidemia    Dyspnea    a. 11/2014: possibly due to combo of CHF and Brilinta . Brilinta  changed to Effient .   Erectile dysfunction    a. Pt aware not to take ED med within 24 hr of NTG and vice versa.   Hypertension    Ischemic cardiomyopathy    a. 11/2014: EF reportedly 20% by cath and echo in [place].   NSVT (nonsustained ventricular tachycardia) (HCC) 11/16/14   a. 11/2014: admitted after discharge from STEMI admission, 18 beats 135-140bpm in ED.   Pericardial effusion    a. 11/2014.   Pericarditis dx'd 11/2014   Pneumonia 1990's X 2   RBBB    Sleep apnea    suspected but not diagnosed (02/28/2015)   STEMI (ST elevation myocardial infarction) (HCC) 11/10/2014   Tobacco abuse    VF (ventricular fibrillation) (HCC) 11/10/14   a. 11/2014: arrest with STEMI.    Past Surgical History:  Procedure Laterality Date   A-FLUTTER ABLATION N/A 09/04/2020   Procedure: A-FLUTTER ABLATION;  Surgeon: Jolly Needle, MD;  Location: [facility] INVASIVE CV LAB;  Service: Cardiovascular;  Laterality: N/A;   ATRIAL FIBRILLATION ABLATION N/A 08/04/2022   Procedure: ATRIAL FIBRILLATION  ABLATION;  Surgeon: Boyce Byes, MD;  Location: [facility] INVASIVE CV LAB;  Service: Cardiovascular;  Laterality: N/A;   CARDIAC CATHETERIZATION  11/19/2014   CARDIAC DEFIBRILLATOR PLACEMENT  02/28/2015   COLONOSCOPY WITH PROPOFOL  N/A 03/16/2023   Procedure: COLONOSCOPY WITH PROPOFOL ;  Surgeon: Daina Drum, MD;  Location: [facility] ENDOSCOPY;  Service: Gastroenterology;  Laterality: N/A;   CORONARY ANGIOPLASTY WITH STENT PLACEMENT  11/10/2014   LAD DES   HAND SURGERY Bilateral  Dupuytren's Contracture   IMPLANTABLE CARDIOVERTER DEFIBRILLATOR IMPLANT N/A 02/28/2015   SJM Fortify Assura VR ICD implanted by Dr Nunzio Belch.  Revascularized following VF arrest, EF remained depressed and ICD implanted   KNEE ARTHROSCOPY Right 12/08/1980   LEFT HEART CATH N/A 11/19/2014   Procedure: LEFT HEART CATH;  Surgeon: Avanell Leigh, MD;  [facility]: [facility] CATH LAB;  Service: Cardiovascular;  Laterality: N/A;   POLYPECTOMY  03/16/2023   Procedure: POLYPECTOMY;  Surgeon: Daina Drum, MD;  Location: [facility]OS[facility];  Service: Gastroenterology;;   RIGHT/LEFT HEART CATH AND CORONARY ANGIOGRAPHY N/A 07/22/2021   Procedure: RIGHT/LEFT HEART CATH AND CORONARY ANGIOGRAPHY;  Surgeon: Mardell Shade, MD;  Location: [facility]AS[facility] CV LAB;  Service: Cardiovascular;  Laterality: N/A;    Current Outpatient Medications  Medication Sig Dispense Refill   acetaminophen  (TYLENOL ) 500 MG tablet Take 500 mg by mouth every 8 (eight) hours as needed for moderate pain.     atorvastatin  (LIPITOR) 20 MG tablet TAKE 1 TABLET BY MOUTH DAILY 90 tablet 3   cyanocobalamin  (VITAMIN B12) 1000 MCG tablet Take 1,000 mcg by mouth daily.     ELIQUIS  5 MG TABS tablet TAKE 1 TABLET BY MOUTH 2 TIMES A DAY 60 tablet 5   furosemide  (LASIX ) 20 MG tablet TAKE 1 TABLET BY MOUTH DAILY AS NEEDED 30 tablet 2   levothyroxine (SYNTHROID) 112 MCG tablet Take 112 mcg by mouth daily before breakfast.     metoprolol  succinate (TOPROL -XL) 25 MG 24 hr tablet Take 1  tablet (25 mg total) by mouth at bedtime. 90 tablet 3   nitroGLYCERIN  (NITROSTAT ) 0.4 MG SL tablet PLACE 1 TABLET UNDER THE TONGUE EVERY 5 MINUTES AS NEEDED FOR CHEST PAIN 25 tablet 3   omega-3 acid ethyl esters (LOVAZA) 1 g capsule Take 2 g by mouth 2 (two) times daily.     Pirfenidone  267 MG TABS Take 3 tablets (801 mg total) by mouth 3 (three) times daily with meals. 270 tablet 4   Probiotic Product (PROBIOTIC PO) Take 1 tablet by mouth every other day.     sacubitril -valsartan  (ENTRESTO ) 24-26 MG Take 1 tablet by mouth 2 (two) times daily. 180 tablet 3   spironolactone  (ALDACTONE ) 25 MG tablet Take 1 tablet (25 mg total) by mouth daily. 30 tablet 11   TRELEGY ELLIPTA 100-62.5-25 MCG/ACT AEPB Take 1 puff by mouth daily.     No current facility-administered medications for this encounter.    Allergies:   Ambien  [zolpidem  tartrate], Lisinopril , and Brilinta  [ticagrelor ]   Social History:  The patient  reports that he quit smoking about 9 years ago. His smoking use included cigarettes. He started smoking about 49 years ago. He has a 80 pack-year smoking history. He has never used smokeless tobacco. He reports current alcohol  use of about 14.0 standard drinks of alcohol  per week. He reports that he does not use drugs.   Family History:  The patient's  family history includes COPD in his mother; Crohn's disease in his brother; Heart disease in his mother; Hypertension in his father and mother; Prostate cancer in his father.   Review of systems complete and found to be negative unless listed in HPI.     Vitals:   04/05/24 1443  BP: 110/62  Pulse: 60  SpO2: 94%  Weight: 95.8 kg (211 lb 3.2 oz)  Height: 6\' 3"  (1.905 m)     Wt Readings from Last 3 Encounters:  04/05/24 95.8 kg (211 lb 3.2 oz)  03/24/24 93.9 kg (207  lb)  03/01/24 97.3 kg (214 lb 9.6 oz)   Physical exam: General:  Sitting on exam table No resp difficulty HEENT: normal Neck: supple. no JVD. Carotids 2+ bilat; no bruits.  No lymphadenopathy or thryomegaly appreciated. Cor: PMI nondisplaced. Regular rate & rhythm. No rubs, gallops or murmurs. Lungs: clear but decreased Abdomen: soft, nontender, nondistended. No hepatosplenomegaly. No bruits or masses. Good bowel sounds. Extremities: no cyanosis, clubbing, rash, edema Neuro: alert & orientedx3, cranial nerves grossly intact. moves all 4 extremities w/o difficulty. Affect pleasant   Recent Labs: 05/08/2023: Pro B Natriuretic peptide (BNP) 433.0 06/01/2023: B Natriuretic Peptide 851.1 09/23/2023: Magnesium 1.9 10/08/2023: Hemoglobin 14.0; Platelets 291.0 01/08/2024: BUN 18; Creatinine, Ser 1.25; Potassium 4.8; Sodium 140 03/01/2024: ALT 15   Lipid Panel     Component Value Date/Time   CHOL 127 11/07/2021 1212   CHOL 101 12/04/2014 1030   TRIG 170 (H) 11/07/2021 1212   HDL 51 11/07/2021 1212   HDL 43 12/04/2014 1030   CHOLHDL 2.5 11/07/2021 1212   VLDL 34 11/07/2021 1212   LDLCALC 42 11/07/2021 1212   LDLCALC 39 12/04/2014 1030     ASSESSMENT AND PLAN:  1. Chronic systolic due to ischemic CM  - Echo 03/2016 LVEF 20-25%, Grade 2 DD - Echo 3/18 shows with stable LVEF 20-25%. Normal RV.  - Echo 9/18 EF 20-25%, grade 2 DD  - CPX 2018 pVO2 19.6 (69%) slope 37 RER 1.06 FEV1 (68%)  - CPX 7/24: pVO2: 15.0 (57%) slope 44 RER 1.12 - Echo 05/16/20  EF 25% RV normal. - Echo 1/23 EF 25-30%  - Stable NYHA II-III Symptoms due to mix of HD and ILD/COPD - Volume status stable on lasix  20 mg PRN - Continue Entresto  24/26.  Up-titration Limited by low BP  - Continue carvedilol  6.25 mg BID.  - Continue sprio 12.5 mg daily (dose cut back due to hyperkalemia) .   - Continue Farxiga  10 - ICD interrogated personally. No VT/AF. Volume ok Personally reviewed - Can consider Barostim or CCM as needed - Labs today  2.  CAD s/p Anterior MI December 2015:  - Cath 8/22 with stable CAD - No s/s angina - Continue DAPT/statin - Lipids per Dr. Bernetta Brilliant.   3. AF/AFL - seen on  zio 5% AF burden - On eliqus - Had AF ablation 08/04/22 remains off amio - No recurrent AF on ICD interrogation  4. H/o VT - had SVT/VT in 12/22 - Device reprogrammed. Off amio - As above. No VT on ICD today   5. Tobacco abuse:  - Remains quit  6. Emphysema/ILD - Sees pulmonary. Spirometry on CPX previously reviewed with only mild disease.  - Hi-res CT 5/24 suggestive of ILD but ? Whether related to amio. Amio now off and has been treated with steroids x 4 weeks. Check sed rate. Repeat Hi-res CT - Repeat Hi-rest CT in 9/24 significant ILD  - Follows with Dr. Emery Hans - Refer to Pulmonary Rehab  7. Snoring - Sleep study pending   Jules Oar, MD  3:00 PM

## 2024-04-05 NOTE — Patient Instructions (Signed)
 There has been no changes to your medications.  Labs done today, your results will be available in MyChart, we will contact you for abnormal readings.  You have been referred to pulmonary rehab. They will call you to arrange your appointment.  Your physician recommends that you schedule a follow-up appointment in: 6 months ( October) ** PLEASE CALL THE OFFICE IN JULY TO ARRANGE YOUR FOLLOW UP APPOINTMENT.**  If you have any questions or concerns before your next appointment please send us  a message through [place] or call our office at [phone].    TO LEAVE A MESSAGE FOR THE NURSE SELECT OPTION 2, PLEASE LEAVE A MESSAGE INCLUDING: YOUR NAME DATE OF BIRTH CALL BACK NUMBER REASON FOR CALL**this is important as we prioritize the call backs  YOU WILL RECEIVE A CALL BACK THE SAME DAY AS LONG AS YOU CALL BEFORE 4:00 PM  At the Advanced Heart Failure Clinic, you and your health needs are our priority. As part of our continuing mission to provide you with exceptional heart care, we have created designated Provider Care Teams. These Care Teams include your primary Cardiologist (physician) and Advanced Practice Providers (APPs- Physician Assistants and Nurse Practitioners) who all work together to provide you with the care you need, when you need it.   You may see any of the following providers on your designated Care Team at your next follow up: Dr Jules Oar Dr Peder Bourdon Dr. Alwin Baars Dr. Arta Lark Amy Marijane Shoulders, NP Ruddy Corral, [place] [facility] [place], [place] Dennise Fitz, NP [place] Lee, NP Shawnee Dellen, NP Luster Salters, PharmD Bevely Brush, PharmD   Please be sure to bring in all your medications bottles to every appointment.    Thank you for choosing [facility]-Advanced [facility] Failure [facility]

## 2024-04-06 ENCOUNTER — Other Ambulatory Visit: Payer: Self-pay

## 2024-04-11 ENCOUNTER — Ambulatory Visit (HOSPITAL_BASED_OUTPATIENT_CLINIC_OR_DEPARTMENT_OTHER): Attending: Cardiology | Admitting: Cardiology

## 2024-04-11 DIAGNOSIS — R0683 Snoring: Secondary | ICD-10-CM | POA: Insufficient documentation

## 2024-04-11 DIAGNOSIS — G4733 Obstructive sleep apnea (adult) (pediatric): Secondary | ICD-10-CM | POA: Diagnosis not present

## 2024-04-11 DIAGNOSIS — I2510 Atherosclerotic heart disease of native coronary artery without angina pectoris: Secondary | ICD-10-CM | POA: Insufficient documentation

## 2024-04-15 NOTE — Progress Notes (Signed)
 Remote ICD transmission.

## 2024-04-16 NOTE — Procedures (Signed)
   [facility] [address] [place], [place] [address] Tel: [phone]   Fax: [phone]  Titration Interpretation  Patient Name:  Kyle Bullock, Kyle Bullock Date:  04/11/2024 Referring Physician:  Gaylyn Keas, Md  Indications for Polysomnography The patient is a 73 year old Male who is 6\' 3"  and weighs 207.0 lbs. His BMI equals 26.0.  A full night titration treatment study was performed.  Medication were taken at 9 pm    Pirfenidone        Polysomnogram Data A full night polysomnogram recorded the standard physiologic parameters including EEG, EOG, EMG, EKG, nasal and oral airflow.  Respiratory parameters of chest and abdominal movements were recorded with Respiratory Inductance Plethysmography belts.  Oxygen saturation was recorded by pulse oximetry.   Sleep Architecture The total recording time of the polysomnogram was 360.5 minutes.  The total sleep time was 0 minutes.  The patient spent 0 of total sleep time in Stage N1, 0 in Stage N2, 0 in Stages N3, and 0 in REM.  Sleep latency was 0 minutes.    Sleep Efficiency was 0.0%.  Wake after Sleep Onset time was 0 minutes.  Titration Summary The patient was titrated at pressures ranging from 6cm H20 with up to 7cm H20. The last pressure used in the study was 7cm H20.  Respiratory Events The polysomnogram revealed a presence of 0 obstructive, 0 central, and 0 mixed apneas resulting in an Apnea index of 0 events per hour.  There were - hypopneas (>=3% desaturation and/or arousal) resulting in an Apnea\Hypopnea Index (AHI >=3% desaturation and/or arousal) of 0 events per hour.  There were 0 hypopneas (>=4% desaturation) resulting in an Apnea\Hypopnea Index (AHI >=4% desaturation) of 0 events per hour.  There were 0 Respiratory Effort Related Arousals resulting in a RERA index of 0 events per hour. The Respiratory Disturbance Index is 0 events per hour.  The snore index was 0 events per hour.  Mean  oxygen saturation was 92.4%.  Time spent <=88% oxygen saturation was 0.5 minutes (0.1%).  Limb Activity There were 0 limb movements recorded  Cardiac Summary The average pulse rate was 61.5 bpm.  The minimum pulse rate was 48.0 bpm while the maximum pulse rate was 80.0 bpm.  Cardiac rhythm was NSR with PVCs  Diagnosis:  Obstructive Sleep Apnea  Recommendations: Unsuccessful CPAP titration due to no sleep time during titration. Recommend a trial of ResMed auto CPAP from 4 to 20cm H2O with heated humidity and Large Nuance Progel pillow mask. The patient should be counseled in good sleep hygiene and avoid sleeping supine. Encourage patient to avoid driving when sleepy. Followup in Sleep Medicine Clinic in 6 weeks.   This study was personally reviewed and electronically signed by: Gaylyn Keas, Md Accredited Board Certified in Sleep Medicine Date/Time: 04/16/2024 8:43PM

## 2024-04-18 ENCOUNTER — Telehealth: Payer: Self-pay

## 2024-04-18 NOTE — Telephone Encounter (Addendum)
 Notified patient of Sleep study results and recommendations. All questions (if any) were answered and patient verbalized understanding. Order for PAP device and supplies sent to [facility].

## 2024-04-18 NOTE — Telephone Encounter (Signed)
-----   Message from Gaylyn Keas sent at 04/16/2024  8:45 PM EDT ----- Patient had no sleep during CPAP titration.  Order ResMed CPAP on auto from 4 to 20cm H2O with heated humidity, mask of choice

## 2024-04-27 ENCOUNTER — Other Ambulatory Visit: Payer: Self-pay

## 2024-04-27 ENCOUNTER — Other Ambulatory Visit (HOSPITAL_COMMUNITY): Payer: Self-pay

## 2024-04-27 NOTE — Progress Notes (Signed)
 Specialty Pharmacy Refill Coordination Note  Kyle Bullock is a 73 y.o. male contacted today regarding refills of specialty medication(s) Pirfenidone    Patient requested (Patient-Rptd) Delivery   Delivery date: (Patient-Rptd) 05/06/24   Verified address: (Patient-Rptd) [address] [place], [place]. [address]   Medication will be filled on 05/05/24.

## 2024-05-04 ENCOUNTER — Telehealth (HOSPITAL_COMMUNITY): Payer: Self-pay

## 2024-05-04 NOTE — Telephone Encounter (Signed)
 Called patient to see if he was interested in participating in the Pulmonary Rehab Program. Patient will come in for orientation on 6/04 and will attend the 10:15 exercise class.  Sent MyChart message.

## 2024-05-04 NOTE — Telephone Encounter (Signed)
 Pt insurance is active and benefits verified through [employer]. Co-pay $15, DED $0/$0 met, out of pocket $3,400/$120.42 met, co-insurance 0%. No pre-authorization required. 05/04/2024 @ 10:22am, spoke with Halford Levels, REF# [id number].

## 2024-05-09 DIAGNOSIS — G4733 Obstructive sleep apnea (adult) (pediatric): Secondary | ICD-10-CM | POA: Diagnosis not present

## 2024-05-10 ENCOUNTER — Telehealth (HOSPITAL_COMMUNITY): Payer: Self-pay

## 2024-05-10 NOTE — Telephone Encounter (Signed)
 Called to confirm appt. Pt confirmed appt. Instructed pt on proper footwear. Gave directions along with department number.

## 2024-05-11 ENCOUNTER — Encounter (HOSPITAL_COMMUNITY): Payer: Self-pay

## 2024-05-11 ENCOUNTER — Encounter (HOSPITAL_COMMUNITY)
Admission: RE | Admit: 2024-05-11 | Discharge: 2024-05-11 | Disposition: A | Discharge status: Home / Self Care | Source: Ambulatory Visit | Attending: Internal Medicine | Admitting: Internal Medicine

## 2024-05-11 VITALS — BP 90/60 | Wt 212.1 lb

## 2024-05-11 DIAGNOSIS — J849 Interstitial pulmonary disease, unspecified: Secondary | ICD-10-CM | POA: Insufficient documentation

## 2024-05-11 NOTE — Progress Notes (Signed)
 Kyle Bullock 73 y.o. male Pulmonary Rehab Orientation Note This patient who was referred to Pulmonary Rehab by Dr. Julane Ny with the diagnosis of ILD arrived today in Cardiac and Pulmonary Rehab. He arrived ambulatory with normal gait. He does not carry portable oxygen. [facility] is the provider for their DME. Per patient, Kyle Bullock uses oxygen never. Color good, skin warm and dry. Patient is oriented to time and place. Patient's medical history, psychosocial health, and medications reviewed. Psychosocial assessment reveals patient lives with spouse. Kyle Bullock is currently retired. Patient hobbies include gardening and reading. Patient reports his stress level is moderate. Areas of stress/anxiety include family . Patient does not exhibit signs of depression. PHQ2/9 score 0/2. Kyle Bullock shows good  coping skills with positive outlook on life. Offered emotional support and reassurance. Will continue to monitor. Physical assessment performed by Kyle Harman RN. Please see their orientation physical assessment note. Kyle Bullock reports he  does take medications as prescribed. Patient states he  follows a regular  diet. The patient has been trying to lose weight through a healthy diet and exercise program.. Patient's weight will be monitored closely. Demonstration and practice of PLB using pulse oximeter. Kyle Bullock able to return demonstration satisfactorily. Safety and hand hygiene in the exercise area reviewed with patient. Kyle Bullock voices understanding of the information reviewed. Department expectations discussed with patient and achievable goals were set. The patient shows enthusiasm about attending the program and we look forward to working with Kyle Bullock. Kyle Bullock completed a 6 min walk test today and is scheduled to begin exercise on 05/19/24 at 10:15 am.  1030-1200 Kyle Huron, MS, ACSM-CEP

## 2024-05-11 NOTE — Progress Notes (Signed)
 Pulmonary Individual Treatment Plan  Patient Details  Name: Kyle Bullock MRN: [id number] Date of Birth: [birth year] Referring Provider:   Gattis Bullock Pulmonary Rehab Walk Test from 05/11/2024 in [facility]  Referring Provider Kyle Bullock       Initial Encounter Date:  Flowsheet Row Pulmonary Rehab Walk Test from 05/11/2024 in [facility]  Date 05/11/24       Visit Diagnosis: ILD (interstitial lung disease) (HCC)  Patient's Home Medications on Admission:   Current Outpatient Medications:    acetaminophen  (TYLENOL ) 500 MG tablet, Take 500 mg by mouth every 8 (eight) hours as needed for moderate pain., Disp: , Rfl:    atorvastatin  (LIPITOR) 20 MG tablet, TAKE 1 TABLET BY MOUTH DAILY, Disp: 90 tablet, Rfl: 3   cyanocobalamin  (VITAMIN B12) 1000 MCG tablet, Take 1,000 mcg by mouth daily., Disp: , Rfl:    ELIQUIS  5 MG TABS tablet, TAKE 1 TABLET BY MOUTH 2 TIMES A DAY, Disp: 60 tablet, Rfl: 5   furosemide  (LASIX ) 20 MG tablet, TAKE 1 TABLET BY MOUTH DAILY AS NEEDED, Disp: 30 tablet, Rfl: 2   levothyroxine (SYNTHROID) 112 MCG tablet, Take 112 mcg by mouth daily before breakfast., Disp: , Rfl:    metoprolol  succinate (TOPROL -XL) 25 MG 24 hr tablet, Take 1 tablet (25 mg total) by mouth at bedtime., Disp: 90 tablet, Rfl: 3   nitroGLYCERIN  (NITROSTAT ) 0.4 MG SL tablet, PLACE 1 TABLET UNDER THE TONGUE EVERY 5 MINUTES AS NEEDED FOR CHEST PAIN, Disp: 25 tablet, Rfl: 3   omega-3 acid ethyl esters (LOVAZA) 1 g capsule, Take 2 g by mouth 2 (two) times daily., Disp: , Rfl:    Pirfenidone  267 MG TABS, Take 3 tablets (801 mg total) by mouth 3 (three) times daily with meals., Disp: 270 tablet, Rfl: 4   Probiotic Product (PROBIOTIC PO), Take 1 tablet by mouth every other day., Disp: , Rfl:    sacubitril -valsartan  (ENTRESTO ) 24-26 MG, Take 1 tablet by mouth 2 (two) times daily., Disp: 180  tablet, Rfl: 3   spironolactone  (ALDACTONE ) 25 MG tablet, Take 1 tablet (25 mg total) by mouth daily. (Patient taking differently: Take 12.5 mg by mouth daily.), Disp: 30 tablet, Rfl: 11   TRELEGY ELLIPTA 100-62.5-25 MCG/ACT AEPB, Take 1 puff by mouth daily., Disp: , Rfl:   Past Medical History: Past Medical History:  Diagnosis Date   Abnormal TSH    a. 11/2014: felt d/t sick euthyroid.   AICD (automatic cardioverter/defibrillator) present 02/28/2015   CAD (coronary artery disease)    a. 11/2014: arrest/STEMI s/p Xience DES to the LAD and PTCA to the D1 [place], [place]). b. Relook cath 11/2014: no culprit, patent stent.   CHF (congestive heart failure) (HCC)    Dressler's syndrome (HCC)    a. 11/2014: tx with colchicine  and prednisone .   Dyslipidemia    Dyspnea    a. 11/2014: possibly due to combo of CHF and Brilinta . Brilinta  changed to Effient .   Erectile dysfunction    a. Pt aware not to take ED med within 24 hr of NTG and vice versa.   Hypertension    Ischemic cardiomyopathy    a. 11/2014: EF reportedly 20% by cath and echo in [place].   NSVT (nonsustained ventricular tachycardia) (HCC) 11/16/14   a. 11/2014: admitted after discharge from STEMI admission, 18 beats 135-140bpm in ED.   Pericardial effusion    a. 11/2014.  Pericarditis dx'd 11/2014   Pneumonia 1990's X 2   RBBB    Sleep apnea    suspected but not diagnosed (02/28/2015)   STEMI (ST elevation myocardial infarction) (HCC) 11/10/2014   Tobacco abuse    VF (ventricular fibrillation) (HCC) 11/10/14   a. 11/2014: arrest with STEMI.    Tobacco Use: Social History   Tobacco Use  Smoking Status Former   Current packs/day: 0.00   Average packs/day: 2.0 packs/day for 40.0 years (80.0 ttl pk-yrs)   Types: Cigarettes   Start date: 11/10/1974   Quit date: 11/10/2014   Years since quitting: 9.5  Smokeless Tobacco Never    Labs: Review Flowsheet       Latest Ref Rng & Units 12/04/2014 01/08/2015 07/22/2021 11/07/2021   Labs for ITP Cardiac and Pulmonary Rehab  Cholestrol 0 - 200 mg/dL 161  096  - 045   LDL (calc) 0 - 99 mg/dL 39  38  - 42   HDL-C >40 mg/dL 43  98.11  - 51   Trlycerides <150 mg/dL 94  914.7  - 829   PH, Arterial 7.350 - 7.450 - - 7.385  -  PCO2 arterial 32.0 - 48.0 mmHg - - 39.6  -  Bicarbonate 20.0 - 28.0 mmol/L - - 19.3  24.4  23.7  -  TCO2 22 - 32 mmol/L - - 20  26  25   -  Acid-base deficit 0.0 - 2.0 mmol/L - - 5.0  1.0  1.0  -  O2 Saturation % - - 74.0  74.0  99.0  -    Details       Multiple values from one day are sorted in reverse-chronological order         Capillary Blood Glucose: Lab Results  Component Value Date   GLUCAP 88 09/02/2020     Pulmonary Assessment Scores:  Pulmonary Assessment Scores     Row Name 05/11/24 1102         ADL UCSD   ADL Phase Entry     SOB Score total 30       CAT Score   CAT Score 13       mMRC Score   mMRC Score 1             UCSD: Self-administered rating of dyspnea associated with activities of daily living (ADLs) 6-point scale (0 = "not at all" to 5 = "maximal or unable to do because of breathlessness")  Scoring Scores range from 0 to 120.  Minimally important difference is 5 units  CAT: CAT can identify the health impairment of COPD patients and is better correlated with disease progression.  CAT has a scoring range of zero to 40. The CAT score is classified into four groups of low (less than 10), medium (10 - 20), high (21-30) and very high (31-40) based on the impact level of disease on health status. A CAT score over 10 suggests significant symptoms.  A worsening CAT score could be explained by an exacerbation, poor medication adherence, poor inhaler technique, or progression of COPD or comorbid conditions.  CAT MCID is 2 points  mMRC: mMRC (Modified Medical Research Council) Dyspnea Scale is used to assess the degree of baseline functional disability in patients of respiratory disease due to dyspnea. No  minimal important difference is established. A decrease in score of 1 point or greater is considered a positive change.   Pulmonary Function Assessment:  Pulmonary Function Assessment - 05/11/24 1154  Breath   Shortness of Breath Yes;Limiting activity             Exercise Target Goals: Exercise Program Goal: Individual exercise prescription set using results from initial 6 min walk test and THRR while considering  patient's activity barriers and safety.   Exercise Prescription Goal: Initial exercise prescription builds to 30-45 minutes a day of aerobic activity, 2-3 days per week.  Home exercise guidelines will be given to patient during program as part of exercise prescription that the participant will acknowledge.  Activity Barriers & Risk Stratification:  Activity Barriers & Cardiac Risk Stratification - 05/11/24 1102       Activity Barriers & Cardiac Risk Stratification   Activity Barriers Shortness of Breath;Muscular Weakness;Deconditioning    Comments Rt hip arthritis             6 Minute Walk:  6 Minute Walk     Row Name 05/11/24 1150         6 Minute Walk   Phase Initial     Distance 1050 feet     Walk Time 6 minutes     # of Rest Breaks 0     MPH 1.99     METS 2.22     RPE 9     Perceived Dyspnea  1     VO2 Peak 7.75     Symptoms No     Resting HR 60 bpm     Resting BP 90/60     Resting Oxygen Saturation  94 %     Exercise Oxygen Saturation  during 6 min walk 90 %     Max Ex. HR 99 bpm     Max Ex. BP 102/56     2 Minute Post BP 104/56       Interval HR   1 Minute HR 85     2 Minute HR 98     3 Minute HR 99     4 Minute HR 91     5 Minute HR 88     6 Minute HR 99     2 Minute Post HR 57     Interval Heart Rate? Yes       Interval Oxygen   Interval Oxygen? Yes     Baseline Oxygen Saturation % 94 %     1 Minute Oxygen Saturation % 94 %     1 Minute Liters of Oxygen 0 L     2 Minute Oxygen Saturation % 90 %     2 Minute Liters of  Oxygen 0 L     3 Minute Oxygen Saturation % 92 %     3 Minute Liters of Oxygen 0 L     4 Minute Oxygen Saturation % 90 %     4 Minute Liters of Oxygen 0 L     5 Minute Oxygen Saturation % 90 %     5 Minute Liters of Oxygen 0 L     6 Minute Oxygen Saturation % 91 %     6 Minute Liters of Oxygen 0 L     2 Minute Post Oxygen Saturation % 95 %     2 Minute Post Liters of Oxygen 0 L              Oxygen Initial Assessment:  Oxygen Initial Assessment - 05/11/24 1057       Home Oxygen   Home Oxygen Device None    Sleep Oxygen Prescription CPAP  Home Exercise Oxygen Prescription None    Home Resting Oxygen Prescription None    Compliance with Home Oxygen Use No      Initial 6 min Walk   Oxygen Used None      Program Oxygen Prescription   Program Oxygen Prescription None      Intervention   Short Term Goals To learn and understand importance of maintaining oxygen saturations>88%;To learn and demonstrate proper use of respiratory medications;To learn and understand importance of monitoring SPO2 with pulse oximeter and demonstrate accurate use of the pulse oximeter.;To learn and demonstrate proper pursed lip breathing techniques or other breathing techniques. ;To learn and exhibit compliance with exercise, home and travel O2 prescription    Long  Term Goals Exhibits compliance with exercise, home  and travel O2 prescription;Verbalizes importance of monitoring SPO2 with pulse oximeter and return demonstration;Maintenance of O2 saturations>88%;Exhibits proper breathing techniques, such as pursed lip breathing or other method taught during program session;Compliance with respiratory medication;Demonstrates proper use of MDI's             Oxygen Re-Evaluation:  Oxygen Re-Evaluation     Row Name 05/11/24 1057             Goals/Expected Outcomes   Goals/Expected Outcomes Compliance and understanding of oxygen saturation monitoring and breathing techniques to decrease shortness  of breath.                Oxygen Discharge (Final Oxygen Re-Evaluation):  Oxygen Re-Evaluation - 05/11/24 1057       Goals/Expected Outcomes   Goals/Expected Outcomes Compliance and understanding of oxygen saturation monitoring and breathing techniques to decrease shortness of breath.             Initial Exercise Prescription:  Initial Exercise Prescription - 05/11/24 1200       Date of Initial Exercise RX and Referring Provider   Date 05/11/24    Referring Provider Kyle Bullock    Expected Discharge Date 08/09/24      Treadmill   MPH 2.5    Grade 1.5    Minutes 15      Bike   Level 1    Watts 40    Minutes 15    METs 2.5      Prescription Details   Frequency (times per week) 2    Duration Progress to 30 minutes of continuous aerobic without signs/symptoms of physical distress      Intensity   THRR 40-80% of Max Heartrate 58-117    Ratings of Perceived Exertion 11-13    Perceived Dyspnea 0-4      Progression   Progression Continue progressive overload as per policy without signs/symptoms or physical distress.      Resistance Training   Training Prescription Yes    Weight blue bands    Reps 10-15             Perform Capillary Blood Glucose checks as needed.  Exercise Prescription Changes:   Exercise Comments:   Exercise Goals and Review:   Exercise Goals     Row Name 05/11/24 1152             Exercise Goals   Increase Physical Activity Yes       Intervention Provide advice, education, support and counseling about physical activity/exercise needs.;Develop an individualized exercise prescription for aerobic and resistive training based on initial evaluation findings, risk stratification, comorbidities and participant's personal goals.       Expected Outcomes Short Term: Attend rehab on a  regular basis to increase amount of physical activity.;Long Term: Add in home exercise to make exercise part of routine and to increase amount of  physical activity.;Long Term: Exercising regularly at least 3-5 days a week.       Increase Strength and Stamina Yes       Intervention Provide advice, education, support and counseling about physical activity/exercise needs.;Develop an individualized exercise prescription for aerobic and resistive training based on initial evaluation findings, risk stratification, comorbidities and participant's personal goals.       Expected Outcomes Short Term: Increase workloads from initial exercise prescription for resistance, speed, and METs.;Short Term: Perform resistance training exercises routinely during rehab and add in resistance training at home;Long Term: Improve cardiorespiratory fitness, muscular endurance and strength as measured by increased METs and functional capacity ( )       Able to understand and use rate of perceived exertion (RPE) scale Yes       Intervention Provide education and explanation on how to use RPE scale       Expected Outcomes Short Term: Able to use RPE daily in rehab to express subjective intensity level;Long Term:  Able to use RPE to guide intensity level when exercising independently       Able to understand and use Dyspnea scale Yes       Intervention Provide education and explanation on how to use Dyspnea scale       Expected Outcomes Short Term: Able to use Dyspnea scale daily in rehab to express subjective sense of shortness of breath during exertion;Long Term: Able to use Dyspnea scale to guide intensity level when exercising independently       Knowledge and understanding of Target Heart Rate Range (THRR) Yes       Intervention Provide education and explanation of THRR including how the numbers were predicted and where they are located for reference       Expected Outcomes Short Term: Able to state/look up THRR;Long Term: Able to use THRR to govern intensity when exercising independently;Short Term: Able to use daily as guideline for intensity in rehab        Understanding of Exercise Prescription Yes       Intervention Provide education, explanation, and written materials on patient's individual exercise prescription       Expected Outcomes Short Term: Able to explain program exercise prescription;Long Term: Able to explain home exercise prescription to exercise independently                Exercise Goals Re-Evaluation :  Exercise Goals Re-Evaluation     Row Name 05/11/24 1152             Exercise Goal Re-Evaluation   Exercise Goals Review Increase Physical Activity;Able to understand and use Dyspnea scale;Understanding of Exercise Prescription;Increase Strength and Stamina;Knowledge and understanding of Target Heart Rate Range (THRR);Able to understand and use rate of perceived exertion (RPE) scale       Comments Patient is scheduled to begin exercise on 6/12.       Expected Outcomes Through exercise at rehab and home, the patient will decrease shortness of breath with daily activities and feel confident in carrying out an exercise regimen at home.                Discharge Exercise Prescription (Final Exercise Prescription Changes):   Nutrition:  Target Goals: Understanding of nutrition guidelines, daily intake of sodium 1500mg , cholesterol 200mg , calories 30% from fat and 7% or less from saturated fats, daily  to have 5 or more servings of fruits and vegetables.  Biometrics:    Nutrition Therapy Plan and Nutrition Goals:   Nutrition Assessments:  MEDIFICTS Score Key: >=70 Need to make dietary changes  40-70 Heart Healthy Diet <= 40 Therapeutic Level Cholesterol Diet   Picture Your Plate Scores: <16 Unhealthy dietary pattern with much room for improvement. 41-50 Dietary pattern unlikely to meet recommendations for good health and room for improvement. 51-60 More healthful dietary pattern, with some room for improvement.  >60 Healthy dietary pattern, although there may be some specific behaviors that could be  improved.    Nutrition Goals Re-Evaluation:   Nutrition Goals Discharge (Final Nutrition Goals Re-Evaluation):   Psychosocial: Target Goals: Acknowledge presence or absence of significant depression and/or stress, maximize coping skills, provide positive support system. Participant is able to verbalize types and ability to use techniques and skills needed for reducing stress and depression.  Initial Review & Psychosocial Screening:  Initial Psych Review & Screening - 05/11/24 1055       Initial Review   Current issues with None Identified      Family Dynamics   Good Support System? Yes    Comments spouse, children      Barriers   Psychosocial barriers to participate in program There are no identifiable barriers or psychosocial needs.      Screening Interventions   Interventions Encouraged to exercise             Quality of Life Scores:  Scores of 19 and below usually indicate a poorer quality of life in these areas.  A difference of  2-3 points is a clinically meaningful difference.  A difference of 2-3 points in the total score of the Quality of Life Index has been associated with significant improvement in overall quality of life, self-image, physical symptoms, and general health in studies assessing change in quality of life.  PHQ-9: Review Flowsheet       05/11/2024 04/25/2015 12/27/2014  Depression screen PHQ 2/9  Decreased Interest 0 0 0  Down, Depressed, Hopeless 0 0 1  PHQ - 2 Score 0 0 1  Altered sleeping 1 - -  Tired, decreased energy 1 - -  Change in appetite 0 - -  Feeling bad or failure about yourself  0 - -  Trouble concentrating 0 - -  Moving slowly or fidgety/restless 0 - -  Suicidal thoughts 0 - -  PHQ-9 Score 2 - -  Difficult doing work/chores Not difficult at all - -   Interpretation of Total Score  Total Score Depression Severity:  1-4 = Minimal depression, 5-9 = Mild depression, 10-14 = Moderate depression, 15-19 = Moderately severe  depression, 20-27 = Severe depression   Psychosocial Evaluation and Intervention:  Psychosocial Evaluation - 05/11/24 1055       Psychosocial Evaluation & Interventions   Comments Marijean Shouts denies any psychosocial barriers at this time.    Expected Outcomes For Marijean Shouts to participate in rehab free of psychosocial barriers.    Continue Psychosocial Services  No Follow up required             Psychosocial Re-Evaluation:   Psychosocial Discharge (Final Psychosocial Re-Evaluation):   Education: Education Goals: Education classes will be provided on a weekly basis, covering required topics. Participant will state understanding/return demonstration of topics presented.  Learning Barriers/Preferences:  Learning Barriers/Preferences - 05/11/24 1056       Learning Barriers/Preferences   Learning Barriers Sight    Learning Preferences None  Education Topics: Know Your Numbers Group instruction that is supported by a PowerPoint presentation. Instructor discusses importance of knowing and understanding resting, exercise, and post-exercise oxygen saturation, heart rate, and blood pressure. Oxygen saturation, heart rate, blood pressure, rating of perceived exertion, and dyspnea are reviewed along with a normal range for these values.    Exercise for the Pulmonary Patient Group instruction that is supported by a PowerPoint presentation. Instructor discusses benefits of exercise, core components of exercise, frequency, duration, and intensity of an exercise routine, importance of utilizing pulse oximetry during exercise, safety while exercising, and options of places to exercise outside of rehab.    MET Level  Group instruction provided by PowerPoint, verbal discussion, and written material to support subject matter. Instructor reviews what METs are and how to increase METs.    Pulmonary Medications Verbally interactive group education provided by instructor with focus on  inhaled medications and proper administration.   Anatomy and Physiology of the Respiratory System Group instruction provided by PowerPoint, verbal discussion, and written material to support subject matter. Instructor reviews respiratory cycle and anatomical components of the respiratory system and their functions. Instructor also reviews differences in obstructive and restrictive respiratory diseases with examples of each.    Oxygen Safety Group instruction provided by PowerPoint, verbal discussion, and written material to support subject matter. There is an overview of "What is Oxygen" and "Why do we need it".  Instructor also reviews how to create a safe environment for oxygen use, the importance of using oxygen as prescribed, and the risks of noncompliance. There is a brief discussion on traveling with oxygen and resources the patient may utilize.   Oxygen Use Group instruction provided by PowerPoint, verbal discussion, and written material to discuss how supplemental oxygen is prescribed and different types of oxygen supply systems. Resources for more information are provided.    Breathing Techniques Group instruction that is supported by demonstration and informational handouts. Instructor discusses the benefits of pursed lip and diaphragmatic breathing and detailed demonstration on how to perform both.     Risk Factor Reduction Group instruction that is supported by a PowerPoint presentation. Instructor discusses the definition of a risk factor, different risk factors for pulmonary disease, and how the heart and lungs work together.   Pulmonary Diseases Group instruction provided by PowerPoint, verbal discussion, and written material to support subject matter. Instructor gives an overview of the different type of pulmonary diseases. There is also a discussion on risk factors and symptoms as well as ways to manage the diseases.   Stress and Energy Conservation Group instruction  provided by PowerPoint, verbal discussion, and written material to support subject matter. Instructor gives an overview of stress and the impact it can have on the body. Instructor also reviews ways to reduce stress. There is also a discussion on energy conservation and ways to conserve energy throughout the day.   Warning Signs and Symptoms Group instruction provided by PowerPoint, verbal discussion, and written material to support subject matter. Instructor reviews warning signs and symptoms of stroke, heart attack, cold and flu. Instructor also reviews ways to prevent the spread of infection.   Other Education Group or individual verbal, written, or video instructions that support the educational goals of the pulmonary rehab program.    Knowledge Questionnaire Score:  Knowledge Questionnaire Score - 05/11/24 1158       Knowledge Questionnaire Score   Pre Score 17/18             Core Components/Risk Factors/Patient  Goals at Admission:  Personal Goals and Risk Factors at Admission - 05/11/24 1056       Core Components/Risk Factors/Patient Goals on Admission    Weight Management Weight Loss;Yes    Intervention Weight Management: Develop a combined nutrition and exercise program designed to reach desired caloric intake, while maintaining appropriate intake of nutrient and fiber, sodium and fats, and appropriate energy expenditure required for the weight goal.;Weight Management: Provide education and appropriate resources to help participant work on and attain dietary goals.;Weight Management/Obesity: Establish reasonable short term and long term weight goals.;Obesity: Provide education and appropriate resources to help participant work on and attain dietary goals.    Expected Outcomes Short Term: Continue to assess and modify interventions until short term weight is achieved;Long Term: Adherence to nutrition and physical activity/exercise program aimed toward attainment of established  weight goal;Weight Maintenance: Understanding of the daily nutrition guidelines, which includes 25-35% calories from fat, 7% or less cal from saturated fats, less than 200mg  cholesterol, less than 1.5gm of sodium, & 5 or more servings of fruits and vegetables daily;Weight Loss: Understanding of general recommendations for a balanced deficit meal plan, which promotes 1-2 lb weight loss per week and includes a negative energy balance of [phone] kcal/d;Understanding recommendations for meals to include 15-35% energy as protein, 25-35% energy from fat, 35-60% energy from carbohydrates, less than 200mg  of dietary cholesterol, 20-35 gm of total fiber daily;Understanding of distribution of calorie intake throughout the day with the consumption of 4-5 meals/snacks    Improve shortness of breath with ADL's Yes    Intervention Provide education, individualized exercise plan and daily activity instruction to help decrease symptoms of SOB with activities of daily living.    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

## 2024-05-17 ENCOUNTER — Encounter: Payer: Self-pay | Admitting: Internal Medicine

## 2024-05-17 ENCOUNTER — Ambulatory Visit: Admitting: Internal Medicine

## 2024-05-17 VITALS — BP 100/62 | HR 59 | Ht 75.0 in | Wt 208.0 lb

## 2024-05-17 DIAGNOSIS — K521 Toxic gastroenteritis and colitis: Secondary | ICD-10-CM | POA: Diagnosis not present

## 2024-05-17 DIAGNOSIS — J432 Centrilobular emphysema: Secondary | ICD-10-CM

## 2024-05-17 DIAGNOSIS — Z5181 Encounter for therapeutic drug level monitoring: Secondary | ICD-10-CM

## 2024-05-17 DIAGNOSIS — L270 Generalized skin eruption due to drugs and medicaments taken internally: Secondary | ICD-10-CM

## 2024-05-17 DIAGNOSIS — R0902 Hypoxemia: Secondary | ICD-10-CM

## 2024-05-17 DIAGNOSIS — Z87891 Personal history of nicotine dependence: Secondary | ICD-10-CM

## 2024-05-17 DIAGNOSIS — J84112 Idiopathic pulmonary fibrosis: Secondary | ICD-10-CM

## 2024-05-17 LAB — HEPATIC FUNCTION PANEL
ALT: 12 U/L (ref 0–53)
AST: 15 U/L (ref 0–37)
Albumin: 4.3 g/dL (ref 3.5–5.2)
Alkaline Phosphatase: 75 U/L (ref 39–117)
Bilirubin, Direct: 0.1 mg/dL (ref 0.0–0.3)
Total Bilirubin: 0.6 mg/dL (ref 0.2–1.2)
Total Protein: 6.4 g/dL (ref 6.0–8.3)

## 2024-05-17 NOTE — Progress Notes (Signed)
 Synopsis: Referred in 2016 for evaluation of COPD. Has ischemic cardiomyopathy after a large MI. LVEF 25-30% as per echocardiogram in March 2016. December 2015 CT angiogram chest: No pulmonary embolism, mild centrilobular and paraseptal emphysema and upper lobe distribution bilaterally, small pleural effusion on the left, there is dependent groundglass and mild interlobular septal thickening bilaterally Spirometry as part of June 2016 cardiopulmonary exercise test: Ratio 73%, FEV1 2.4 L (61% predicted), FVC 3.27 L (66% predicted). 06/2015 CT chest HRCT> very mild patches of ground glass in the bases of both lungs with non-specific interstitial changes, no clear fibrotic change; scattered pulmonary nodules appear stable compared to 11/2014, emphysema and bronchial wall thickening noted July 2016 pulmonary function testing: Ratio 73% predicted, FEV1 2.42 L (59% predicted) FVC 3.33 L (61% predicted), total lung capacity 5.74 L (71% predicted), DLCO 15.23 (38% predicted), Hgb 13.5  = COPD "GOLD 0"  [place] imp/plan  06/25/15  Centrilobular emphysema  CT scan from July 2016. This showed mild centrilobular emphysema bilaterally. There was a question of some very mild nonspecific interstitial changes in the periphery of the right lung and some scant dependent changes in the left lung.  >>>follow  with at least one repeat CT scan in one year with a repeat lung function test.  Multiple pulmonary nodules He had a few scattered small pulmonary nodules. These have been stable on 2 separate images. The radiologist has recommended a repeat CT chest in one year.    Toward end of April 2024 nasal congestion/ muscle aches/sorethroat bad cough clear mucus rx  doxy/omnicef but really didn't turn the corner until prednisone  rx    05/08/2023  New Pt eval/ Pulmonary ov /Wert re: COPD 0/ emphysema on and off amiodarone  since 05/2020  maint on Trelegy since Feb 2024 from anoro while also back  on amiodarone  daily since  08/04/22  Chief Complaint  Patient presents with   Consult    SOB with exertion x 6 weeks.  Review CT scan 05/01/2023. Concern with taking amiodarone .   Dyspnea:  was walking 2.5 miles per day and stopped and now back to mile and a half and finished prednisone  on 05/07/23 p 6 day Cough: loose clear  Sleeping: bed is flat/ one pillow  SABA use: none  02: none    No obvious day to day or daytime variability or assoc excess/ purulent sputum or mucus plugs or hemoptysis or cp or chest tightness, subjective wheeze or overt sinus or hb symptoms.   Sleeping  without nocturnal  or early am exacerbation  of respiratory  c/o's or need for noct saba. Also denies any obvious fluctuation of symptoms with weather or environmental changes or other aggravating or alleviating factors except as outlined above   No unusual exposure hx or h/o childhood pna/ asthma or knowledge of premature birth.         OV 10/08/2023 -transfer of care from Dr. Vernestine Gondola to Dr. Bertrum Brodie in the ILD center.  Referred by Dr. Jules Oar and Dr. Gay Katayama primary care.  Subjective:  Patient ID: Kyle Bullock, male , DOB: August 08, 1951 , age 73 y.o. , MRN: [id number] , ADDRESS: [address] [place] [address] PCP Jeannine Milroy., MD Patient Care Team: Jeannine Milroy., MD as PCP - General (Internal Medicine) Boyce Byes, MD as PCP - Electrophysiology (Cardiology)  This Provider for this visit: Treatment Team:  Attending Provider: Maire Scot, MD    10/08/2023 -   Chief  Complaint  Patient presents with   Follow-up    Pt is here for F/U visit. Pt had PFT done 10-08-2023.     HPI Kyle Bullock 73 y.o. -history is provided by the patient, his wife and also review of the external medical record.  He is retired.  He is a wine salesman.  Now worked in [employer].  He is known to have ischemic cardiomyopathy and has a defibrillator.  He sees Dr. Harvie Liner for atrial  fibrillation.  He sees Dr. Daniel [name] for chronic systolic heart failure all his problems started in December 2015 when he suffered VF arrest and status post STEMI with drug-eluting stent to the LAD December 2015.  This was in [place] .  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  He has been a patient of Dr. Jules Oar per chart review since August 2022.  As of 2022 it was observed he had emphysema and no ILD.  He had seen Dr. Earmon Glow in this pulmonary clinic.  His previous history of amiodarone  was between June 2021 and October 2021.  Most recently he says that he status post ablation in the fall 2023.  Review of the records confirm this happen for atrial fibrillation on 08/04/2022.  He was then placed on amiodarone .  Then he said getting dyspneic in May 2024 he did have a high-resolution CT chest that showed ILD.  He saw Dr. Vernestine Gondola.  Subsequently placed on prednisone  for 6 weeks by Dr. Marven Slimmer.  After the prednisone  and discontinuation of the amiodarone  he started feeling better but he still not back to his summer 2023 baseline although he is better compared to April 2023.  And his most recent visit with Dr. Jules Oar he said he was feeling better and walking to 20 miles per day 3 to 4 days/week.  In July 2024 he did have a cardiopulmonary stress test that showed a VO2 max of 15 mL/kg/min.  Unclear if desaturation test was done.  [In 2018 he had done 19 mL/kg/min] in addition in this 1 there was heavy dead space ventilation.  Review of his imaging shows that in my personal visualization and impression in 2018 all he had was emphysema in 2019 some ILD started showing up in terms of groundglass opacities by 2021 I think this reticulation and by 2022 there is definite ILD.  His most recent 2 scans of May 2024 and September 2024.  Alternative pattern is described by the radiologist.  I personally visualized it and agree with that.  So lower probably T4 UIP/IPF but he does  have associated emphysema.  For his emphysema he is on Trelegy  Current symptom scores as below   And walking desaturation test today he desaturated pretty fast he needed 4 L to correct.  We are discharging him on portable oxygen.   ILD symptom another questionnaire   Past medical history ILD relevant - History of amiodarone  therapy present - Did have COVID in 2022  Family history of lung disease - Mother had COPD - Brother had asthma  Personal exposure history - Smoked heavily between 20 and 2015 1 pack/day. - No marijuana no cocaine no intravenous drug use  Personal exposure history - [place] age of the current home is 46 years.  He is lived there for the 30 years.  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  He used to distribute wine from wholesaler to retailer but he was now exposed to be needed by the grape leaves can have mold  Pulmonary toxicity history - History of amiodarone  therapy twice.  Otherwise negative.        Narrative & Impression  CLINICAL DATA:  73 year old male with history of interstitial lung disease.   EXAM: CT CHEST WITHOUT CONTRAST   TECHNIQUE: Multidetector CT imaging of the chest was performed following the standard protocol without intravenous contrast. High resolution imaging of the lungs, as well as inspiratory and expiratory imaging, was performed.   RADIATION DOSE REDUCTION: This exam was performed according to the departmental dose-optimization program which includes automated exposure control, adjustment of the mA and/or kV according to patient size and/or use of iterative reconstruction technique.   COMPARISON:  High-resolution chest CT 05/01/2023.   FINDINGS: Cardiovascular: Heart size is borderline enlarged. There is no significant pericardial fluid, thickening or  pericardial calcification. There is aortic atherosclerosis, as well as atherosclerosis of the great vessels of the mediastinum and the coronary arteries, including calcified atherosclerotic plaque in the left main, left anterior descending, left circumflex and right coronary arteries. Left-sided pacemaker/AICD with lead tip terminating in the right ventricle.   Mediastinum/Nodes: No pathologically enlarged mediastinal or hilar lymph nodes. Please note that accurate exclusion of hilar adenopathy is limited on noncontrast CT scans. Esophagus is unremarkable in appearance. No axillary lymphadenopathy.   Lungs/Pleura: High-resolution images again demonstrate widespread but patchy areas of ground-glass attenuation, septal thickening, subpleural reticulation, cylindrical traction bronchiectasis, peripheral bronchiolectasis and some upper lung predominant honeycombing which appears minimally progressive compared to the prior examination. Inspiratory and expiratory imaging demonstrates some air trapping indicative of small airways disease. No acute consolidative airspace disease. No pleural effusions. Calcified granuloma in the periphery of the right lower lobe. A few scattered small pulmonary nodules are noted, largest of which is a sessile appearing pleural-based nodule in the posterior right upper lobe (axial image 37 of series 6) measuring 1.4 x 0.6 cm, stable. Other smaller pulmonary nodules are stable. No other larger more suspicious appearing pulmonary nodules or masses are noted. Diffuse bronchial wall thickening with mild to moderate centrilobular and paraseptal emphysema.   Upper Abdomen: Aortic atherosclerosis.   Musculoskeletal: There are no aggressive appearing lytic or blastic lesions noted in the visualized portions of the skeleton.   IMPRESSION: 1. Fibrotic changes in the lungs once again considered most compatible with an alternative diagnosis (not usual  interstitial pneumonia) per current ATS guidelines. Overall, given the presence of air trapping and upper lung predominant honeycombing, findings are once again favored to reflect chronic hypersensitivity pneumonitis. 2. Mild diffuse bronchial wall thickening with mild to moderate centrilobular and paraseptal emphysema; imaging findings suggestive of COPD. 3. Stable pulmonary nodules, likely benign. Continued attention at time of routine annual low-dose lung cancer screening chest CTs is recommended. 4. Aortic atherosclerosis, in addition to left main and three-vessel coronary artery disease. Assessment for potential risk factor modification, dietary therapy or pharmacologic therapy may be warranted, if clinically indicated.   Aortic Atherosclerosis (ICD10-I70.0) and Emphysema (ICD10-J43.9).     Electronically Signed   By: Alexandria Angel M.D.   On: 08/27/2023 11:25       OV 12/15/2023  Subjective:  Patient ID: Kyle Bullock, male , DOB: [birth year] , age 40 y.o. , MRN: [id number] , ADDRESS: [address] [place] [address] PCP Jeannine Milroy., MD Patient Care Team: Jeannine Milroy., MD as PCP -  General (Internal Medicine) Boyce Byes, MD as PCP - Electrophysiology (Cardiology)  This Provider for this visit: Treatment Team:  Attending Provider: Maire Scot, MD    12/15/2023 -   Chief Complaint  Patient presents with   Follow-up    Pt states he has been good. Denies any concerns      HPI Kyle Bullock 73 y.o. -presents with his wife.  His ILD workup in progress.  Known chronic systolic heart failure since cardiac arrest in 2015.  Former smoker with known emphysema.   She is an independent shistorian.  She is a retired Engineer, civil (consulting).  At the last visit was the first intake consult for ILD.  Towards the end of the visit I was surprised that he had exercise hypoxemia and this required 4 L for correction.  Therefore recommendation for using  portable oxygen was made.  He he and his wife expressed dissatisfaction and the wait was communicated.  Apologized for that and took accountability.  Since then he had overnight pulse oximetry study.  This was abnormal.  Recommended nighttime oxygen but he has declined.  He did have a sleep study done on 12/13/2023.  He was not aware of the results to live mentioned to him.  This show she has severe sleep apnea AHI 50.5 obstructive and moderate central sleep apnea 22.9/h along with oxygen desaturations less than 88% for 41 minutes.  Otherwise in the interim he reports no change in his health status no admissions to the hospital no ER visits no urgent care visits.  He is here to discuss his workup so far.  He is hypersensitive pneumonitis panel autoimmune panel is all negative.  We discussed in case conference.  That the thinking was that in the upper lobes he had smoking-related interstitial fibrosis and in the lower lobes it was more consistent with probable UIP therefore raising the possibility of IPF.  He definitely has associated emphysema.  I again visualized the CT scans with him.  There was definitely some early ILD in 2016 but it is definitely progressed.   Overall explained to him that we he does have -Associated emphysema with pulmonary fibrosis -The pulmonary fibrosis is progressive -There is lack of clarity between radiologist as to the definitive pattern of pulmonary fibrosis which then suggest that he meets indication for lung biopsy but given his heart failure I would think the risk of lung biopsy is significant -Will give him a clinical diagnosis of IPF based on age, male gender, former smoking history, associate emphysema, progression and conference suggesting probable UIP in the bases. -Explained that if alternative etiologies such as amiodarone  or high percent pneumonitis are consideration then steroids would be indicated.  He did recall that he did do a course of steroids with his of  amiodarone .  He did not like the side effects and it did not help him.  -Explained that antifibrotic's are indicated.  Went over pirfenidone  and nintedanib.  Given his previous cardiac history and the fact he is on anticoagulation do not recommend nintedanib other than being a second line.  Recommend pirfenidone .  Went over details of the drug and the insurance approval process.  He understood   -Also explained the rationale for oxygen.  Particularly with improvement in quality of life.  At this point in time he is not keen on portable oxygen.  However he is agreed to follow-up with the sleep specialist      OV 03/01/2024  Subjective:  Patient ID: Kyle Bullock,  male , DOB: Aug 27, 1951 , age 89 y.o. , MRN: [id number] , ADDRESS: [address] [place] [address] PCP Jeannine Milroy., MD Patient Care Team: Jeannine Milroy., MD as PCP - General (Internal Medicine) Boyce Byes, MD as PCP - Electrophysiology (Cardiology)  This Provider for this visit: Treatment Team:  Attending Provider: Maire Scot, MD    03/01/2024 -   Chief Complaint  Patient presents with   Follow-up    Breathing has improved some. He is max dose esbriet - has been having diarrhea but his appetite ok and no vomiting.     IPF diagnosed January 2025 and started on pirfenidone  Known chronic systolic heart failure since cardiac arrest in 2015.   Former smoker with known emphysema. Sleep apnea severe obstructive and moderate central early 2025 diagnosed by Dr. Starr Eddy Exertional hypoxemia nocturnal hypoxemia due to all of the above.   Esbriet /Pirfenidone  requires intensive drug monitoring due to high concerns for Adverse effects of , including  Drug Induced Liver Injury, significant GI side effects that include but not limited to Diarrhea, Nausea, Vomiting,  and other system side effects that include Fatigue, headaches, weight loss and other side effects such as skin rash. These will be  monitored with  blood work such as LFT initially once a month for 6 months and then quarterly   HPI    OV 05/17/2024  Subjective:  Patient ID: Kyle Bullock, male , DOB: Apr 08, 1951 , age 25 y.o. , MRN: [id number] , ADDRESS: [address] [place] [address] PCP Jeannine Milroy., MD Patient Care Team: Jeannine Milroy., MD as PCP - General (Internal Medicine) Boyce Byes, MD as PCP - Electrophysiology (Cardiology)  This Provider for this visit: Treatment Team:  Attending Provider: Maire Scot, MD    05/17/2024 -   Chief Complaint  Patient presents with   Follow-up    Follow up for ILD , no changes in breathing      HPI Kyle Bullock 73 y.o. -    Kyle Bullock 73 y.o. -presents with his wife.  Issues at this visit  #Symptoms: These are slightly better according to him after he started pirfenidone .  And since the last visit it is stable.  #Pirfenidone  uptake he was having some diarrhea but after taking CAROB floor this is now resolved.   #Therapeutic monitoring because of high risk medication -pirfenidone  requiring intensive therapeutic monitoring: He is not having any nausea vomiting or diminished appetite or weight loss which are classic side effects.  Not having headache and fatigue.  Instead he is having increased diarrhea compared to baseline [see symptom score below].  It happens 3-4 times a week particularly after lunch.  He does take some fiber early in the morning but does not drink coffee other than the morning.  He has not had incontinence but he is able to go to the bathroom it is loose and foul-smelling but there is no tenesmus.  No bloody diarrhea.  He has not tried Imodium which is adequate.  Also recommended natural product CArob  #Emphysema: Stable on Trelegy  # IPF: Currently clinically stable.  He is on the registry program.  He took a pulmonary function test and is stable compared to October 2024  #Nocturnal hypoxemia and  sleep apnea: Being addressed by Dr. Starr Eddy   #Lung cancer screening: He was getting low-dose CT scan screening with Dr. Gay Katayama his primary care physician.  Now he  will have high-resolution CT chest.  Next 1 due September 2025/October 2025.  #New issues rash: Within sun exposed areas in the shin when he wear shorts.  Started walking out a lot as the weather has gotten better.  He is also having some rash and itchiness in the back of his neck.  He only now he started wearing a widebrimmed hat.  The SPF sunscreen that he has placed with the back of his neck is 30 and not 50.    SYMPTOM SCALE - ILD 10/08/2023 12/15/2023 IPF new dx 03/01/2024 Esbriet  full does  Current weight     O2 use ra ra ra  Shortness of Breath 0 -> 5 scale with 5 being worst (score 6 If unable to do)    At rest 1 0 0  Simple tasks - showers, clothes change, eating, shaving 1 0 2  Household (dishes, doing bed, laundry) 2 2 2   Shopping 2 1 1   Walking level at own pace 2 1 2   Walking up Stairs 2 2 2   Total (30-36) Dyspnea Score 10 6 9   How bad is your cough? 0 1 2  How bad is your fatigue 1 1 2   How bad is nausea 0 0 0  How bad is vomiting?  0 0 0  How bad is diarrhea? 0 1 3 wprse  How bad is anxiety? 1 1 2   How bad is depression 1 1 2   Any chronic pain - if so where and how bad 1    0 2    Simple office walk 224 (66+46 x 2) feet Pod A at [facility] x  3 laps goal with forehead probe 10/08/2023  10/08/2023   O2 used ra ra  Number laps completed Sit stand x 4 time Half laps  Comments about pace Good pac   Resting Pulse Ox/HR 98% and 72/min 97%   Final Pulse Ox/HR 83% and 98/min 85% in just half la  Desaturated </= 88% TES   Desaturated <= 3% points no   Got Tachycardic >/= 90/min yes   Symptoms at end of test duyspnea Needed 4L [place] to correct  to wal 2 LAps  Miscellaneous comments x         PFT     Latest Ref Rng & Units 03/24/2024   11:26 AM 10/08/2023   10:48 AM 06/22/2018    1:52 PM  06/14/2015   10:58 AM  PFT Results  FVC-Pre L 3.17  3.15  3.55  3.33   FVC-Predicted Pre % 61  60  66  61   FVC-Post L  3.18  3.58    FVC-Predicted Post %  61  66    Pre FEV1/FVC % % 77  77  72  73   Post FEV1/FCV % %  79  68    FEV1-Pre L 2.46  2.43  2.55  2.42   FEV1-Predicted Pre % 65  64  64  59   FEV1-Post L  2.52  2.42    DLCO uncorrected ml/min/mmHg 11.76  11.94  16.18  15.23   DLCO UNC% % 40  40  41  38   DLCO corrected ml/min/mmHg  11.94     DLCO COR %Predicted %  40     DLVA Predicted % 69  72  64  61   TLC L  4.21  5.55  5.74   TLC % Predicted %  52  69  71   RV % Predicted %  43  71  90        LAB RESULTS last 96 hours No results found.       has a past medical history of Abnormal TSH, AICD (automatic cardioverter/defibrillator) present (02/28/2015), CAD (coronary artery disease), CHF (congestive heart failure) (HCC), Dressler's syndrome (HCC), Dyslipidemia, Dyspnea, Erectile dysfunction, Hypertension, Ischemic cardiomyopathy, NSVT (nonsustained ventricular tachycardia) (HCC) (11/16/14), Pericardial effusion, Pericarditis (dx'd 11/2014), Pneumonia (1990's X 2), RBBB, Sleep apnea, STEMI (ST elevation myocardial infarction) (HCC) (11/10/2014), Tobacco abuse, and VF (ventricular fibrillation) (HCC) (11/10/14).   reports that he quit smoking about 9 years ago. His smoking use included cigarettes. He started smoking about 49 years ago. He has a 80 pack-year smoking history. He has never used smokeless tobacco.  Past Surgical History:  Procedure Laterality Date   A-FLUTTER ABLATION N/A 09/04/2020   Procedure: A-FLUTTER ABLATION;  Surgeon: Jolly Needle, MD;  Location: [facility];  Service: Cardiovascular;  Laterality: N/A;   ATRIAL FIBRILLATION ABLATION N/A 08/04/2022   Procedure: ATRIAL FIBRILLATION ABLATION;  Surgeon: Boyce Byes, MD;  Location: [facility];  Service: Cardiovascular;  Laterality: N/A;   CARDIAC CATHETERIZATION  11/19/2014   CARDIAC  DEFIBRILLATOR PLACEMENT  02/28/2015   COLONOSCOPY WITH PROPOFOL  N/A 03/16/2023   Procedure: COLONOSCOPY WITH PROPOFOL ;  Surgeon: Daina Drum, MD;  Location: [facility];  Service: Gastroenterology;  Laterality: N/A;   CORONARY ANGIOPLASTY WITH STENT PLACEMENT  11/10/2014   LAD DES   HAND SURGERY Bilateral    Dupuytren's Contracture   IMPLANTABLE CARDIOVERTER DEFIBRILLATOR IMPLANT N/A 02/28/2015   SJM Fortify Assura VR ICD implanted by Dr Nunzio Belch.  Revascularized following VF arrest, EF remained depressed and ICD implanted   KNEE ARTHROSCOPY Right 12/08/1980   LEFT HEART CATH N/A 11/19/2014   Procedure: LEFT HEART CATH;  Surgeon: Avanell Leigh, MD;  [facility];  Service: Cardiovascular;  Laterality: N/A;   POLYPECTOMY  03/16/2023   Procedure: POLYPECTOMY;  Surgeon: Daina Drum, MD;  Location: [facility];  Service: Gastroenterology;;   RIGHT/LEFT HEART CATH AND CORONARY ANGIOGRAPHY N/A 07/22/2021   Procedure: RIGHT/LEFT HEART CATH AND CORONARY ANGIOGRAPHY;  Surgeon: Mardell Shade, MD;  Location: [facility];  Service: Cardiovascular;  Laterality: N/A;    Allergies  Allergen Reactions   Ambien  [Zolpidem  Tartrate] Anxiety and Other (See Comments)    Causes nightmares   Lisinopril  Cough   Brilinta  [Ticagrelor ] Cough    ? Dyspnea. Changed to Effient  11/2014.    Immunization History  Administered Date(s) Administered   DTaP, 5 pertussis antigens 12/14/2015   Influenza, High Dose Seasonal PF 09/16/2015, 08/30/2017   Influenza, Quadrivalent, Recombinant, Inj, Pf 09/04/2018, 08/27/2019, 09/08/2020, 09/14/2021, 09/26/2023   Influenza-Unspecified 08/17/2014, 09/20/2016   PFIZER(Purple Top)SARS-COV-2 Vaccination 12/28/2019, 01/16/2020   PNEUMOCOCCAL CONJUGATE-20 02/12/2023   Pneumococcal Conjugate-13 07/26/2015   Pneumococcal Polysaccharide-23 11/13/2014, 12/18/2016   Zoster Recombinant(Shingrix) 07/02/2019, 11/10/2019    Family History  Problem Relation Age  of Onset   COPD Mother    Hypertension Mother    Heart disease Mother    Hypertension Father    Prostate cancer Father    Crohn's disease Brother    CAD Neg Hx    Colon cancer Neg Hx    Stomach cancer Neg Hx    Esophageal cancer Neg Hx      Current Outpatient Medications:    acetaminophen  (TYLENOL ) 500 MG tablet, Take 500 mg by mouth every 8 (eight) hours as needed for moderate pain., Disp: , Rfl:  atorvastatin  (LIPITOR) 20 MG tablet, TAKE 1 TABLET BY MOUTH DAILY, Disp: 90 tablet, Rfl: 3   cyanocobalamin  (VITAMIN B12) 1000 MCG tablet, Take 1,000 mcg by mouth daily., Disp: , Rfl:    ELIQUIS  5 MG TABS tablet, TAKE 1 TABLET BY MOUTH 2 TIMES A DAY, Disp: 60 tablet, Rfl: 5   furosemide  (LASIX ) 20 MG tablet, TAKE 1 TABLET BY MOUTH DAILY AS NEEDED, Disp: 30 tablet, Rfl: 2   levothyroxine (SYNTHROID) 112 MCG tablet, Take 112 mcg by mouth daily before breakfast., Disp: , Rfl:    metoprolol  succinate (TOPROL -XL) 25 MG 24 hr tablet, Take 1 tablet (25 mg total) by mouth at bedtime., Disp: 90 tablet, Rfl: 3   nitroGLYCERIN  (NITROSTAT ) 0.4 MG SL tablet, PLACE 1 TABLET UNDER THE TONGUE EVERY 5 MINUTES AS NEEDED FOR CHEST PAIN, Disp: 25 tablet, Rfl: 3   omega-3 acid ethyl esters (LOVAZA) 1 g capsule, Take 2 g by mouth 2 (two) times daily., Disp: , Rfl:    Pirfenidone  267 MG TABS, Take 3 tablets (801 mg total) by mouth 3 (three) times daily with meals., Disp: 270 tablet, Rfl: 4   Probiotic Product (PROBIOTIC PO), Take 1 tablet by mouth every other day., Disp: , Rfl:    sacubitril -valsartan  (ENTRESTO ) 24-26 MG, Take 1 tablet by mouth 2 (two) times daily., Disp: 180 tablet, Rfl: 3   spironolactone  (ALDACTONE ) 25 MG tablet, Take 1 tablet (25 mg total) by mouth daily. (Patient taking differently: Take 12.5 mg by mouth daily.), Disp: 30 tablet, Rfl: 11   TRELEGY ELLIPTA 100-62.5-25 MCG/ACT AEPB, Take 1 puff by mouth daily., Disp: , Rfl:       Objective:   Vitals:   05/17/24 1320  BP: 100/62  Pulse:  (!) 59  SpO2: 97%  Weight: 208 lb (94.3 kg)  Height: 6\' 3"  (1.905 m)    Estimated body mass index is 26 kg/m as calculated from the following:   Height as of this encounter: 6\' 3"  (1.905 m).   Weight as of this encounter: 208 lb (94.3 kg).  @WEIGHTCHANGE @  [employer]   05/17/24 1320  Weight: 208 lb (94.3 kg)     Physical Exam   General: No distress. Looks well O2 at rest: no Cane present: no Sitting in wheel chair: no Frail: no Obese: no Neuro: Alert and Oriented x 3. GCS 15. Speech normal Psych: Pleasant Resp:  Barrel Chest - no.  Wheeze - no, Crackles - no, No overt respiratory distress CVS: Normal heart sounds. Murmurs - no Ext: Stigmata of Connective Tissue Disease - no HEENT: Normal upper airway. PEERL +. No post nasal drip        Assessment:       ICD-10-CM   1. IPF (idiopathic pulmonary fibrosis) (HCC)  J84.112 Hepatic function panel    Pulmonary function test    CT Chest High Resolution    2. Encounter for therapeutic drug monitoring  Z51.81 Hepatic function panel    Pulmonary function test    CT Chest High Resolution    3. Diarrhea due to drug  K52.1 Hepatic function panel    Pulmonary function test    CT Chest High Resolution    4. Rash, drug contact  L27.0 Hepatic function panel    Pulmonary function test    CT Chest High Resolution    5. Exercise hypoxemia  R09.02 Hepatic function panel    Pulmonary function test    CT Chest High Resolution    6. Centrilobular emphysema (HCC)  J43.2 Hepatic function panel    Pulmonary function test    CT Chest High Resolution         Plan:     Patient Instructions     ICD-10-CM   1. IPF (idiopathic pulmonary fibrosis) (HCC)  J84.112     2. Encounter for therapeutic drug monitoring  Z51.81     3. Diarrhea due to drug  K52.1     4. Centrilobular emphysema (HCC)  J43.2     5. Severe sleep apnea  G47.30     6. Exercise hypoxemia  R09.02     7. Screening for lung cancer  Z12.2         # Interstitial lung disease otherwise called pulmonary fibrosis # IPF -diagnosis given in January 2025 [based on the fact age greater than 65, previous smoking history, male gender, Caucasian ethnicity and also associated emphysema)  -Absent in 2018 started in 2019 and progressive.  -Risk factors included amiodarone  2021 and 2023 and COVID 2022  -Clinically stable - cct 2024 -> June 2025 PFT -Tolerating pirfenidone  well   Plan - Continue IPF-PRO registry  -Do spirometry and DLCO in 4 months -Check liver function test today  6/10/2025t - Cotninue ESBRIT but below are holiday rules for rash   #Diarrhea due to drug  - well controlled with CAROB  Plan Continue CAROB Flour OTC as follows Take 1 DESSERT spoon  size serving [approximately 7 g] before breakfast If still no response in 3 days then add another 7 g at dinner If still no response in 3 days then make it to spoon servings at breakfast and 2 spoon servings at dinner and hold  RASH Deu to drug  Plan  - Stop Esbriet  fully for 1 week and then restarted 1 pill 3 times daily for 1 week followed by 2 pills 3 times daily for 1 week and then 3 pills 3 times daily to continue - Wear full-length clothes including widebrimmed hat - Apply Eucerin SPF 50 - Despite the above if rash is still problematic call us  back   #Exercise hypoxemia and new diagnosis of sleep apnea 12/13/2023   -Continue to respect that you do not want to have portable oxygen or nighttime oxygen at this point. -Noted in January 2025 Dr. Starr Eddy is recommended CPAP titration study. -I have counseled the of the benefits of CPAP in the setting of heart failure and sleep apnea.  Plan  --I have sent a message to Dr. Starr Eddy to explain further questions about CPAP titration study and nighttime oxygen use.    #Emphysema  - stable  Plan  - continue trelegy scheduled with albuteorl as needed  #Lung cancer screening -last CT scan September  2024  Plan  - We will capture information with a high-resolution CT chest for ILD in September 2025   Followup October 2025 after PFT and CT chest; 15-minute visit  FOLLOWUP Return in about 6 months (around 11/16/2024) for 15 min visit, after Spiro and DLCO, after HRCT chest, with Dr Bertrum Brodie, with any of the APPS.    SIGNATURE    Dr. Maire Scot, M.D., F.C.C.P,  Pulmonary and Critical Care Medicine Staff Physician, [facility] Center Director - Interstitial Lung Disease  Program  Pulmonary Fibrosis [facility] at [facility] [place], [place], [address]  Pager: [phone], If no answer or between  15:00h - 7:00h: call [phone] Telephone: [phone]  1:39 PM 05/17/2024

## 2024-05-17 NOTE — Patient Instructions (Addendum)
 ICD-10-CM   1. IPF (idiopathic pulmonary fibrosis) (HCC)  J84.112     2. Encounter for therapeutic drug monitoring  Z51.81     3. Diarrhea due to drug  K52.1     4. Centrilobular emphysema (HCC)  J43.2     5. Severe sleep apnea  G47.30     6. Exercise hypoxemia  R09.02     7. Screening for lung cancer  Z12.2        # Interstitial lung disease otherwise called pulmonary fibrosis # IPF -diagnosis given in January 2025 [based on the fact age greater than 65, previous smoking history, male gender, Caucasian ethnicity and also associated emphysema)  -Absent in 2018 started in 2019 and progressive.  -Risk factors included amiodarone  2021 and 2023 and COVID 2022  -Clinically stable - cct 2024 -> June 2025 PFT -Tolerating pirfenidone  well   Plan - Continue IPF-PRO registry  -Do spirometry and DLCO in 4 months -Check liver function test today  6/10/2025t - Cotninue ESBRIT but below are holiday rules for rash   #Diarrhea due to drug  - well controlled with CAROB  Plan Continue CAROB Flour OTC as follows Take 1 DESSERT spoon  size serving [approximately 7 g] before breakfast If still no response in 3 days then add another 7 g at dinner If still no response in 3 days then make it to spoon servings at breakfast and 2 spoon servings at dinner and hold  RASH Deu to drug  Plan  - Stop Esbriet  fully for 1 week and then restarted 1 pill 3 times daily for 1 week followed by 2 pills 3 times daily for 1 week and then 3 pills 3 times daily to continue - Wear full-length clothes including widebrimmed hat - Apply Eucerin SPF 50 - Despite the above if rash is still problematic call us  back   #Exercise hypoxemia and new diagnosis of sleep apnea 12/13/2023   -Continue to respect that you do not want to have portable oxygen or nighttime oxygen at this point. -Noted in January 2025 Dr. Starr Eddy is recommended CPAP titration study. -I have counseled the of the benefits of CPAP in the  setting of heart failure and sleep apnea.  Plan  --I have sent a message to Dr. Starr Eddy to explain further questions about CPAP titration study and nighttime oxygen use.    #Emphysema  - stable  Plan  - continue trelegy scheduled with albuteorl as needed  #Lung cancer screening -last CT scan September 2024  Plan  - We will capture information with a high-resolution CT chest for ILD in September 2025   Followup October 2025 after PFT and CT chest; 15-minute visit

## 2024-05-18 ENCOUNTER — Ambulatory Visit: Payer: Self-pay | Admitting: Internal Medicine

## 2024-05-18 NOTE — Progress Notes (Signed)
 LFT Normal

## 2024-05-18 NOTE — Progress Notes (Signed)
 Pulmonary Individual Treatment Plan  Patient Details  Name: Kyle Bullock MRN: [id number] Date of Birth: 09/25/1951 Referring Provider:   [name] Pulmonary Rehab Walk Test from 05/11/2024 in [facility]  Referring Provider [name]       Initial Encounter Date:  Flowsheet Row Pulmonary Rehab Walk Test from 05/11/2024 in [facility]  Date 05/11/24       Visit Diagnosis: ILD (interstitial lung disease) (HCC)  Patient's Home Medications on Admission:   Current Outpatient Medications:    acetaminophen  (TYLENOL ) 500 MG tablet, Take 500 mg by mouth every 8 (eight) hours as needed for moderate pain., Disp: , Rfl:    atorvastatin  (LIPITOR) 20 MG tablet, TAKE 1 TABLET BY MOUTH DAILY, Disp: 90 tablet, Rfl: 3   cyanocobalamin  (VITAMIN B12) 1000 MCG tablet, Take 1,000 mcg by mouth daily., Disp: , Rfl:    ELIQUIS  5 MG TABS tablet, TAKE 1 TABLET BY MOUTH 2 TIMES A DAY, Disp: 60 tablet, Rfl: 5   furosemide  (LASIX ) 20 MG tablet, TAKE 1 TABLET BY MOUTH DAILY AS NEEDED, Disp: 30 tablet, Rfl: 2   levothyroxine (SYNTHROID) 112 MCG tablet, Take 112 mcg by mouth daily before breakfast., Disp: , Rfl:    metoprolol  succinate (TOPROL -XL) 25 MG 24 hr tablet, Take 1 tablet (25 mg total) by mouth at bedtime., Disp: 90 tablet, Rfl: 3   nitroGLYCERIN  (NITROSTAT ) 0.4 MG SL tablet, PLACE 1 TABLET UNDER THE TONGUE EVERY 5 MINUTES AS NEEDED FOR CHEST PAIN, Disp: 25 tablet, Rfl: 3   omega-3 acid ethyl esters (LOVAZA) 1 g capsule, Take 2 g by mouth 2 (two) times daily., Disp: , Rfl:    Pirfenidone  267 MG TABS, Take 3 tablets (801 mg total) by mouth 3 (three) times daily with meals., Disp: 270 tablet, Rfl: 4   Probiotic Product (PROBIOTIC PO), Take 1 tablet by mouth every other day., Disp: , Rfl:    sacubitril -valsartan  (ENTRESTO ) 24-26 MG, Take 1 tablet by mouth 2 (two) times daily., Disp: 180  tablet, Rfl: 3   spironolactone  (ALDACTONE ) 25 MG tablet, Take 1 tablet (25 mg total) by mouth daily. (Patient taking differently: Take 12.5 mg by mouth daily.), Disp: 30 tablet, Rfl: 11   TRELEGY ELLIPTA 100-62.5-25 MCG/ACT AEPB, Take 1 puff by mouth daily., Disp: , Rfl:   Past Medical History: Past Medical History:  Diagnosis Date   Abnormal TSH    a. 11/2014: felt d/t sick euthyroid.   AICD (automatic cardioverter/defibrillator) present 02/28/2015   CAD (coronary artery disease)    a. 11/2014: arrest/STEMI s/p Xience DES to the LAD and PTCA to the D1 [place], [place]). b. Relook cath 11/2014: no culprit, patent stent.   CHF (congestive heart failure) (HCC)    Dressler's syndrome (HCC)    a. 11/2014: tx with colchicine  and prednisone .   Dyslipidemia    Dyspnea    a. 11/2014: possibly due to combo of CHF and Brilinta . Brilinta  changed to Effient .   Erectile dysfunction    a. Pt aware not to take ED med within 24 hr of NTG and vice versa.   Hypertension    Ischemic cardiomyopathy    a. 11/2014: EF reportedly 20% by cath and echo in [place].   NSVT (nonsustained ventricular tachycardia) (HCC) 11/16/14   a. 11/2014: admitted after discharge from STEMI admission, 18 beats 135-140bpm in ED.   Pericardial effusion    a. 11/2014.  Pericarditis dx'd 11/2014   Pneumonia 1990's X 2   RBBB    Sleep apnea    suspected but not diagnosed (02/28/2015)   STEMI (ST elevation myocardial infarction) (HCC) 11/10/2014   Tobacco abuse    VF (ventricular fibrillation) (HCC) 11/10/14   a. 11/2014: arrest with STEMI.    Tobacco Use: Social History   Tobacco Use  Smoking Status Former   Current packs/day: 0.00   Average packs/day: 2.0 packs/day for 40.0 years (80.0 ttl pk-yrs)   Types: Cigarettes   Start date: 11/10/1974   Quit date: 11/10/2014   Years since quitting: 9.5  Smokeless Tobacco Never    Labs: Review Flowsheet       Latest Ref Rng & Units 12/04/2014 01/08/2015 07/22/2021 11/07/2021   Labs for ITP Cardiac and Pulmonary Rehab  Cholestrol 0 - 200 mg/dL 161  096  - 045   LDL (calc) 0 - 99 mg/dL 39  38  - 42   HDL-C >40 mg/dL 43  98.11  - 51   Trlycerides <150 mg/dL 94  914.7  - 829   PH, Arterial 7.350 - 7.450 - - 7.385  -  PCO2 arterial 32.0 - 48.0 mmHg - - 39.6  -  Bicarbonate 20.0 - 28.0 mmol/L - - 19.3  24.4  23.7  -  TCO2 22 - 32 mmol/L - - 20  26  25   -  Acid-base deficit 0.0 - 2.0 mmol/L - - 5.0  1.0  1.0  -  O2 Saturation % - - 74.0  74.0  99.0  -    Details       Multiple values from one day are sorted in reverse-chronological order         Capillary Blood Glucose: Lab Results  Component Value Date   GLUCAP 88 09/02/2020     Pulmonary Assessment Scores:  Pulmonary Assessment Scores     Row Name 05/11/24 1102         ADL UCSD   ADL Phase Entry     SOB Score total 30       CAT Score   CAT Score 13       mMRC Score   mMRC Score 1             UCSD: Self-administered rating of dyspnea associated with activities of daily living (ADLs) 6-point scale (0 = not at all to 5 = maximal or unable to do because of breathlessness)  Scoring Scores range from 0 to 120.  Minimally important difference is 5 units  CAT: CAT can identify the health impairment of COPD patients and is better correlated with disease progression.  CAT has a scoring range of zero to 40. The CAT score is classified into four groups of low (less than 10), medium (10 - 20), high (21-30) and very high (31-40) based on the impact level of disease on health status. A CAT score over 10 suggests significant symptoms.  A worsening CAT score could be explained by an exacerbation, poor medication adherence, poor inhaler technique, or progression of COPD or comorbid conditions.  CAT MCID is 2 points  mMRC: mMRC (Modified Medical Research Council) Dyspnea Scale is used to assess the degree of baseline functional disability in patients of respiratory disease due to dyspnea. No  minimal important difference is established. A decrease in score of 1 point or greater is considered a positive change.   Pulmonary Function Assessment:  Pulmonary Function Assessment - 05/11/24 1154  Breath   Shortness of Breath Yes;Limiting activity             Exercise Target Goals: Exercise Program Goal: Individual exercise prescription set using results from initial 6 min walk test and THRR while considering  patient's activity barriers and safety.   Exercise Prescription Goal: Initial exercise prescription builds to 30-45 minutes a day of aerobic activity, 2-3 days per week.  Home exercise guidelines will be given to patient during program as part of exercise prescription that the participant will acknowledge.  Activity Barriers & Risk Stratification:  Activity Barriers & Cardiac Risk Stratification - 05/11/24 1102       Activity Barriers & Cardiac Risk Stratification   Activity Barriers Shortness of Breath;Muscular Weakness;Deconditioning    Comments Rt hip arthritis             6 Minute Walk:  6 Minute Walk     Row Name 05/11/24 1150         6 Minute Walk   Phase Initial     Distance 1050 feet     Walk Time 6 minutes     # of Rest Breaks 0     MPH 1.99     METS 2.22     RPE 9     Perceived Dyspnea  1     VO2 Peak 7.75     Symptoms No     Resting HR 60 bpm     Resting BP 90/60     Resting Oxygen Saturation  94 %     Exercise Oxygen Saturation  during 6 min walk 90 %     Max Ex. HR 99 bpm     Max Ex. BP 102/56     2 Minute Post BP 104/56       Interval HR   1 Minute HR 85     2 Minute HR 98     3 Minute HR 99     4 Minute HR 91     5 Minute HR 88     6 Minute HR 99     2 Minute Post HR 57     Interval Heart Rate? Yes       Interval Oxygen   Interval Oxygen? Yes     Baseline Oxygen Saturation % 94 %     1 Minute Oxygen Saturation % 94 %     1 Minute Liters of Oxygen 0 L     2 Minute Oxygen Saturation % 90 %     2 Minute Liters of  Oxygen 0 L     3 Minute Oxygen Saturation % 92 %     3 Minute Liters of Oxygen 0 L     4 Minute Oxygen Saturation % 90 %     4 Minute Liters of Oxygen 0 L     5 Minute Oxygen Saturation % 90 %     5 Minute Liters of Oxygen 0 L     6 Minute Oxygen Saturation % 91 %     6 Minute Liters of Oxygen 0 L     2 Minute Post Oxygen Saturation % 95 %     2 Minute Post Liters of Oxygen 0 L              Oxygen Initial Assessment:  Oxygen Initial Assessment - 05/13/24 0759       Home Oxygen   Home Oxygen Device None    Sleep Oxygen Prescription CPAP  Home Exercise Oxygen Prescription None    Home Resting Oxygen Prescription None    Compliance with Home Oxygen Use No      Initial 6 min Walk   Oxygen Used None      Program Oxygen Prescription   Program Oxygen Prescription None      Intervention   Short Term Goals To learn and understand importance of maintaining oxygen saturations>88%;To learn and demonstrate proper use of respiratory medications;To learn and understand importance of monitoring SPO2 with pulse oximeter and demonstrate accurate use of the pulse oximeter.;To learn and demonstrate proper pursed lip breathing techniques or other breathing techniques. ;To learn and exhibit compliance with exercise, home and travel O2 prescription    Long  Term Goals Exhibits compliance with exercise, home  and travel O2 prescription;Verbalizes importance of monitoring SPO2 with pulse oximeter and return demonstration;Maintenance of O2 saturations>88%;Exhibits proper breathing techniques, such as pursed lip breathing or other method taught during program session;Compliance with respiratory medication;Demonstrates proper use of MDI's             Oxygen Re-Evaluation:  Oxygen Re-Evaluation     Row Name 05/11/24 1057             Goals/Expected Outcomes   Goals/Expected Outcomes Compliance and understanding of oxygen saturation monitoring and breathing techniques to decrease shortness  of breath.                Oxygen Discharge (Final Oxygen Re-Evaluation):  Oxygen Re-Evaluation - 05/11/24 1057       Goals/Expected Outcomes   Goals/Expected Outcomes Compliance and understanding of oxygen saturation monitoring and breathing techniques to decrease shortness of breath.             Initial Exercise Prescription:  Initial Exercise Prescription - 05/11/24 1200       Date of Initial Exercise RX and Referring Provider   Date 05/11/24    Referring Provider [name]    Expected Discharge Date 08/09/24      Treadmill   MPH 2.5    Grade 1.5    Minutes 15      Bike   Level 1    Watts 40    Minutes 15    METs 2.5      Prescription Details   Frequency (times per week) 2    Duration Progress to 30 minutes of continuous aerobic without signs/symptoms of physical distress      Intensity   THRR 40-80% of Max Heartrate 58-117    Ratings of Perceived Exertion 11-13    Perceived Dyspnea 0-4      Progression   Progression Continue progressive overload as per policy without signs/symptoms or physical distress.      Resistance Training   Training Prescription Yes    Weight blue bands    Reps 10-15             Perform Capillary Blood Glucose checks as needed.  Exercise Prescription Changes:   Exercise Comments:   Exercise Goals and Review:   Exercise Goals     Row Name 05/11/24 1152             Exercise Goals   Increase Physical Activity Yes       Intervention Provide advice, education, support and counseling about physical activity/exercise needs.;Develop an individualized exercise prescription for aerobic and resistive training based on initial evaluation findings, risk stratification, comorbidities and participant's personal goals.       Expected Outcomes Short Term: Attend rehab on a  regular basis to increase amount of physical activity.;Long Term: Add in home exercise to make exercise part of routine and to increase amount of  physical activity.;Long Term: Exercising regularly at least 3-5 days a week.       Increase Strength and Stamina Yes       Intervention Provide advice, education, support and counseling about physical activity/exercise needs.;Develop an individualized exercise prescription for aerobic and resistive training based on initial evaluation findings, risk stratification, comorbidities and participant's personal goals.       Expected Outcomes Short Term: Increase workloads from initial exercise prescription for resistance, speed, and METs.;Short Term: Perform resistance training exercises routinely during rehab and add in resistance training at home;Long Term: Improve cardiorespiratory fitness, muscular endurance and strength as measured by increased METs and functional capacity ( )       Able to understand and use rate of perceived exertion (RPE) scale Yes       Intervention Provide education and explanation on how to use RPE scale       Expected Outcomes Short Term: Able to use RPE daily in rehab to express subjective intensity level;Long Term:  Able to use RPE to guide intensity level when exercising independently       Able to understand and use Dyspnea scale Yes       Intervention Provide education and explanation on how to use Dyspnea scale       Expected Outcomes Short Term: Able to use Dyspnea scale daily in rehab to express subjective sense of shortness of breath during exertion;Long Term: Able to use Dyspnea scale to guide intensity level when exercising independently       Knowledge and understanding of Target Heart Rate Range (THRR) Yes       Intervention Provide education and explanation of THRR including how the numbers were predicted and where they are located for reference       Expected Outcomes Short Term: Able to state/look up THRR;Long Term: Able to use THRR to govern intensity when exercising independently;Short Term: Able to use daily as guideline for intensity in rehab        Understanding of Exercise Prescription Yes       Intervention Provide education, explanation, and written materials on patient's individual exercise prescription       Expected Outcomes Short Term: Able to explain program exercise prescription;Long Term: Able to explain home exercise prescription to exercise independently                Exercise Goals Re-Evaluation :  Exercise Goals Re-Evaluation     Row Name 05/11/24 1152             Exercise Goal Re-Evaluation   Exercise Goals Review Increase Physical Activity;Able to understand and use Dyspnea scale;Understanding of Exercise Prescription;Increase Strength and Stamina;Knowledge and understanding of Target Heart Rate Range (THRR);Able to understand and use rate of perceived exertion (RPE) scale       Comments Patient is scheduled to begin exercise on 6/12.       Expected Outcomes Through exercise at rehab and home, the patient will decrease shortness of breath with daily activities and feel confident in carrying out an exercise regimen at home.                Discharge Exercise Prescription (Final Exercise Prescription Changes):   Nutrition:  Target Goals: Understanding of nutrition guidelines, daily intake of sodium 1500mg , cholesterol 200mg , calories 30% from fat and 7% or less from saturated fats, daily  to have 5 or more servings of fruits and vegetables.  Biometrics:    Nutrition Therapy Plan and Nutrition Goals:   Nutrition Assessments:  MEDIFICTS Score Key: >=70 Need to make dietary changes  40-70 Heart Healthy Diet <= 40 Therapeutic Level Cholesterol Diet   Picture Your Plate Scores: <95 Unhealthy dietary pattern with much room for improvement. 41-50 Dietary pattern unlikely to meet recommendations for good health and room for improvement. 51-60 More healthful dietary pattern, with some room for improvement.  >60 Healthy dietary pattern, although there may be some specific behaviors that could be  improved.    Nutrition Goals Re-Evaluation:   Nutrition Goals Discharge (Final Nutrition Goals Re-Evaluation):   Psychosocial: Target Goals: Acknowledge presence or absence of significant depression and/or stress, maximize coping skills, provide positive support system. Participant is able to verbalize types and ability to use techniques and skills needed for reducing stress and depression.  Initial Review & Psychosocial Screening:  Initial Psych Review & Screening - 05/11/24 1055       Initial Review   Current issues with None Identified      Family Dynamics   Good Support System? Yes    Comments spouse, children      Barriers   Psychosocial barriers to participate in program There are no identifiable barriers or psychosocial needs.      Screening Interventions   Interventions Encouraged to exercise             Quality of Life Scores:  Scores of 19 and below usually indicate a poorer quality of life in these areas.  A difference of  2-3 points is a clinically meaningful difference.  A difference of 2-3 points in the total score of the Quality of Life Index has been associated with significant improvement in overall quality of life, self-image, physical symptoms, and general health in studies assessing change in quality of life.  PHQ-9: Review Flowsheet       05/11/2024 04/25/2015 12/27/2014  Depression screen PHQ 2/9  Decreased Interest 0 0 0  Down, Depressed, Hopeless 0 0 1  PHQ - 2 Score 0 0 1  Altered sleeping 1 - -  Tired, decreased energy 1 - -  Change in appetite 0 - -  Feeling bad or failure about yourself  0 - -  Trouble concentrating 0 - -  Moving slowly or fidgety/restless 0 - -  Suicidal thoughts 0 - -  PHQ-9 Score 2 - -  Difficult doing work/chores Not difficult at all - -   Interpretation of Total Score  Total Score Depression Severity:  1-4 = Minimal depression, 5-9 = Mild depression, 10-14 = Moderate depression, 15-19 = Moderately severe  depression, 20-27 = Severe depression   Psychosocial Evaluation and Intervention:  Psychosocial Evaluation - 05/11/24 1055       Psychosocial Evaluation & Interventions   Comments Marijean Shouts denies any psychosocial barriers at this time.    Expected Outcomes For Marijean Shouts to participate in rehab free of psychosocial barriers.    Continue Psychosocial Services  No Follow up required             Psychosocial Re-Evaluation:  Psychosocial Re-Evaluation     Row Name 05/13/24 1328             Psychosocial Re-Evaluation   Current issues with None Identified       Comments Marijean Shouts is scheduled to start PR on 05/19/24. No new psychosocial barriers or concerns at this time.  Expected Outcomes For Marijean Shouts to participate in PR free of any psychosocial barriers or concerns       Interventions Encouraged to attend Pulmonary Rehabilitation for the exercise       Continue Psychosocial Services  No Follow up required                Psychosocial Discharge (Final Psychosocial Re-Evaluation):  Psychosocial Re-Evaluation - 05/13/24 1328       Psychosocial Re-Evaluation   Current issues with None Identified    Comments Marijean Shouts is scheduled to start PR on 05/19/24. No new psychosocial barriers or concerns at this time.    Expected Outcomes For Marijean Shouts to participate in PR free of any psychosocial barriers or concerns    Interventions Encouraged to attend Pulmonary Rehabilitation for the exercise    Continue Psychosocial Services  No Follow up required             Education: Education Goals: Education classes will be provided on a weekly basis, covering required topics. Participant will state understanding/return demonstration of topics presented.  Learning Barriers/Preferences:  Learning Barriers/Preferences - 05/11/24 1056       Learning Barriers/Preferences   Learning Barriers Sight    Learning Preferences None             Education Topics: Know Your Numbers Group instruction that is  supported by a PowerPoint presentation. Instructor discusses importance of knowing and understanding resting, exercise, and post-exercise oxygen saturation, heart rate, and blood pressure. Oxygen saturation, heart rate, blood pressure, rating of perceived exertion, and dyspnea are reviewed along with a normal range for these values.    Exercise for the Pulmonary Patient Group instruction that is supported by a PowerPoint presentation. Instructor discusses benefits of exercise, core components of exercise, frequency, duration, and intensity of an exercise routine, importance of utilizing pulse oximetry during exercise, safety while exercising, and options of places to exercise outside of rehab.    MET Level  Group instruction provided by PowerPoint, verbal discussion, and written material to support subject matter. Instructor reviews what METs are and how to increase METs.    Pulmonary Medications Verbally interactive group education provided by instructor with focus on inhaled medications and proper administration.   Anatomy and Physiology of the Respiratory System Group instruction provided by PowerPoint, verbal discussion, and written material to support subject matter. Instructor reviews respiratory cycle and anatomical components of the respiratory system and their functions. Instructor also reviews differences in obstructive and restrictive respiratory diseases with examples of each.    Oxygen Safety Group instruction provided by PowerPoint, verbal discussion, and written material to support subject matter. There is an overview of "What is Oxygen" and "Why do we need it".  Instructor also reviews how to create a safe environment for oxygen use, the importance of using oxygen as prescribed, and the risks of noncompliance. There is a brief discussion on traveling with oxygen and resources the patient may utilize.   Oxygen Use Group instruction provided by PowerPoint, verbal discussion, and  written material to discuss how supplemental oxygen is prescribed and different types of oxygen supply systems. Resources for more information are provided.    Breathing Techniques Group instruction that is supported by demonstration and informational handouts. Instructor discusses the benefits of pursed lip and diaphragmatic breathing and detailed demonstration on how to perform both.     Risk Factor Reduction Group instruction that is supported by a PowerPoint presentation. Instructor discusses the definition of a risk factor, different risk factors for  pulmonary disease, and how the heart and lungs work together.   Pulmonary Diseases Group instruction provided by PowerPoint, verbal discussion, and written material to support subject matter. Instructor gives an overview of the different type of pulmonary diseases. There is also a discussion on risk factors and symptoms as well as ways to manage the diseases.   Stress and Energy Conservation Group instruction provided by PowerPoint, verbal discussion, and written material to support subject matter. Instructor gives an overview of stress and the impact it can have on the body. Instructor also reviews ways to reduce stress. There is also a discussion on energy conservation and ways to conserve energy throughout the day.   Warning Signs and Symptoms Group instruction provided by PowerPoint, verbal discussion, and written material to support subject matter. Instructor reviews warning signs and symptoms of stroke, heart attack, cold and flu. Instructor also reviews ways to prevent the spread of infection.   Other Education Group or individual verbal, written, or video instructions that support the educational goals of the pulmonary rehab program.    Knowledge Questionnaire Score:  Knowledge Questionnaire Score - 05/11/24 1158       Knowledge Questionnaire Score   Pre Score 17/18             Core Components/Risk Factors/Patient  Goals at Admission:  Personal Goals and Risk Factors at Admission - 05/11/24 1056       Core Components/Risk Factors/Patient Goals on Admission    Weight Management Weight Loss;Yes    Intervention Weight Management: Develop a combined nutrition and exercise program designed to reach desired caloric intake, while maintaining appropriate intake of nutrient and fiber, sodium and fats, and appropriate energy expenditure required for the weight goal.;Weight Management: Provide education and appropriate resources to help participant work on and attain dietary goals.;Weight Management/Obesity: Establish reasonable short term and long term weight goals.;Obesity: Provide education and appropriate resources to help participant work on and attain dietary goals.    Expected Outcomes Short Term: Continue to assess and modify interventions until short term weight is achieved;Long Term: Adherence to nutrition and physical activity/exercise program aimed toward attainment of established weight goal;Weight Maintenance: Understanding of the daily nutrition guidelines, which includes 25-35% calories from fat, 7% or less cal from saturated fats, less than 200mg  cholesterol, less than 1.5gm of sodium, & 5 or more servings of fruits and vegetables daily;Weight Loss: Understanding of general recommendations for a balanced deficit meal plan, which promotes 1-2 lb weight loss per week and includes a negative energy balance of [phone] kcal/d;Understanding recommendations for meals to include 15-35% energy as protein, 25-35% energy from fat, 35-60% energy from carbohydrates, less than 200mg  of dietary cholesterol, 20-35 gm of total fiber daily;Understanding of distribution of calorie intake throughout the day with the consumption of 4-5 meals/snacks    Improve shortness of breath with ADL's Yes    Intervention Provide education, individualized exercise plan and daily activity instruction to help decrease symptoms of SOB with  activities of daily living.    Expected Outcomes Short Term: Improve cardiorespiratory fitness to achieve a reduction of symptoms when performing ADLs;Long Term: Be able to perform more ADLs without symptoms or delay the onset of symptoms             Core Components/Risk Factors/Patient Goals Review:   Goals and Risk Factor Review     Row Name 05/13/24 1329             Core Components/Risk Factors/Patient Goals Review   Personal Goals  Review Weight Management/Obesity;Improve shortness of breath with ADL's;Develop more efficient breathing techniques such as purse lipped breathing and diaphragmatic breathing and practicing self-pacing with activity.       Review Marijean Shouts is scheduled to start PR on 05/19/24. Unable to assess his goals at this time.       Expected Outcomes To improve shortness of breath with ADL's, develop more efficient breathing techniques such as purse lipped breathing and diaphragmatic breathing; and practicing self-pacing with activity and lose weight                Core Components/Risk Factors/Patient Goals at Discharge (Final Review):   Goals and Risk Factor Review - 05/13/24 1329       Core Components/Risk Factors/Patient Goals Review   Personal Goals Review Weight Management/Obesity;Improve shortness of breath with ADL's;Develop more efficient breathing techniques such as purse lipped breathing and diaphragmatic breathing and practicing self-pacing with activity.    Review Marijean Shouts is scheduled to start PR on 05/19/24. Unable to assess his goals at this time.    Expected Outcomes To improve shortness of breath with ADL's, develop more efficient breathing techniques such as purse lipped breathing and diaphragmatic breathing; and practicing self-pacing with activity and lose weight             ITP Comments:Pt is making expected progress toward Pulmonary Rehab goals after completing 0 session(s). Recommend continued exercise, life style modification, education, and  utilization of breathing techniques to increase stamina and strength, while also decreasing shortness of breath with exertion.  Dr. Genetta Kenning is Medical Director for Pulmonary Rehab at [facility].

## 2024-05-19 ENCOUNTER — Encounter (HOSPITAL_COMMUNITY)

## 2024-05-19 ENCOUNTER — Telehealth (HOSPITAL_COMMUNITY): Payer: Self-pay

## 2024-05-19 NOTE — Telephone Encounter (Signed)
-----   Message from Latimer C sent at 05/19/2024 10:03 AM EDT ----- Regarding: Pt called out Hey,  Pt called and stated he will not able to attend PR today due to having diarrhea.  Thanks, Camilo Cella

## 2024-05-24 ENCOUNTER — Encounter (HOSPITAL_COMMUNITY)
Admission: RE | Admit: 2024-05-24 | Discharge: 2024-05-24 | Disposition: A | Discharge status: Home / Self Care | Source: Ambulatory Visit | Attending: Internal Medicine | Admitting: Internal Medicine

## 2024-05-24 DIAGNOSIS — J849 Interstitial pulmonary disease, unspecified: Secondary | ICD-10-CM

## 2024-05-24 NOTE — Progress Notes (Signed)
 Daily Session Note  Patient Details  Name: Kyle Bullock MRN: [id number] Date of Birth: 20-Sep-1951 Referring Provider:   [name] Pulmonary Rehab Walk Test from 05/11/2024 in [facility] for [facility], Vascular, & Lung [facility]  Referring Provider [name]    Encounter Date: 05/24/2024  Check In:  Session Check In - 05/24/24 1123       Check-In   Supervising physician immediately available to respond to emergencies [facility] MD immediately available    Physician(s) Levin Reamer, NP    Location [facility]    Staff Present Atlas Lea, MS, ACSM-CEP, Exercise Physiologist;Randi Rochelle Chu, ACSM-CEP, Exercise Physiologist;Samantha Belarus, RD, Toy Freund, RN, Angie Kenner, RT    Virtual Visit No    Medication changes reported     No    Fall or balance concerns reported    No    Tobacco Cessation No Change    Warm-up and Cool-down Performed as group-led instruction    Resistance Training Performed Yes    VAD Patient? No    PAD/SET Patient? No      Pain Assessment   Currently in Pain? No/denies    Multiple Pain Sites No          Capillary Blood Glucose: No results found for this or any previous visit (from the past 24 hours).    Social History   Tobacco Use  Smoking Status Former   Current packs/day: 0.00   Average packs/day: 2.0 packs/day for 40.0 years (80.0 ttl pk-yrs)   Types: Cigarettes   Start date: 11/10/1974   Quit date: 11/10/2014   Years since quitting: 9.5  Smokeless Tobacco Never    Goals Met:  Proper associated with RPD/PD & O2 Sat Independence with exercise equipment Exercise tolerated well No report of concerns or symptoms today Strength training completed today  Goals Unmet:  Not Applicable  Comments: Service time is from 1013 to 1146.    Kyle Bullock is Medical Director for Pulmonary Rehab at [facility].

## 2024-05-26 ENCOUNTER — Encounter (HOSPITAL_COMMUNITY)
Admission: RE | Admit: 2024-05-26 | Discharge: 2024-05-26 | Disposition: A | Discharge status: Home / Self Care | Source: Ambulatory Visit | Attending: Internal Medicine | Admitting: Internal Medicine

## 2024-05-26 DIAGNOSIS — J849 Interstitial pulmonary disease, unspecified: Secondary | ICD-10-CM | POA: Diagnosis not present

## 2024-05-26 NOTE — Progress Notes (Signed)
 Daily Session Note  Patient Details  Name: Kyle Bullock MRN: [id number] Date of Birth: 12-27-1950 Referring Provider:   [name] Pulmonary Rehab Walk Test from 05/11/2024 in [facility] for Heart, Vascular, & Lung Health  Referring Provider [name]    Encounter Date: 05/26/2024  Check In:  Session Check In - 05/26/24 1034       Check-In   Supervising physician immediately available to respond to emergencies [facility] immediately available    Physician(s) Lawana Pray, NP    [facility]    Staff Present Atlas Lea, MS, ACSM-CEP, Exercise Physiologist;Randi Rochelle Chu, ACSM-CEP, Exercise Physiologist;Samantha Belarus, RD, Toy Freund, RN, Angie Kenner, RT    Virtual Visit No    Medication changes reported     No    Fall or balance concerns reported    No    Tobacco Cessation No Change    Warm-up and Cool-down Performed as group-led instruction    Resistance Training Performed Yes    VAD Patient? No    PAD/SET Patient? No      Pain Assessment   Currently in Pain? No/denies    Multiple Pain Sites No          Capillary Blood Glucose: No results found for this or any previous visit (from the past 24 hours).    Social History   Tobacco Use  Smoking Status Former   Current packs/day: 0.00   Average packs/day: 2.0 packs/day for 40.0 years (80.0 ttl pk-yrs)   Types: Cigarettes   Start date: 11/10/1974   Quit date: 11/10/2014   Years since quitting: 9.5  Smokeless Tobacco Never    Goals Met:  Proper associated with RPD/PD & O2 Sat Exercise tolerated well No report of concerns or symptoms today Strength training completed today  Goals Unmet:  Not Applicable  Comments: Service time is from 1010 to 1143.  Dr. Genetta Kenning is Medical Director for Pulmonary Rehab at [facility].

## 2024-05-30 ENCOUNTER — Other Ambulatory Visit: Payer: Self-pay

## 2024-05-31 ENCOUNTER — Encounter (INDEPENDENT_AMBULATORY_CARE_PROVIDER_SITE_OTHER): Payer: Self-pay

## 2024-05-31 ENCOUNTER — Other Ambulatory Visit: Payer: Self-pay

## 2024-05-31 ENCOUNTER — Ambulatory Visit (INDEPENDENT_AMBULATORY_CARE_PROVIDER_SITE_OTHER): Payer: PPO

## 2024-05-31 ENCOUNTER — Encounter (HOSPITAL_COMMUNITY)
Admission: RE | Admit: 2024-05-31 | Discharge: 2024-05-31 | Disposition: A | Discharge status: Home / Self Care | Source: Ambulatory Visit | Attending: Internal Medicine | Admitting: Internal Medicine

## 2024-05-31 VITALS — Wt 209.4 lb

## 2024-05-31 DIAGNOSIS — J849 Interstitial pulmonary disease, unspecified: Secondary | ICD-10-CM | POA: Diagnosis not present

## 2024-05-31 DIAGNOSIS — I472 Ventricular tachycardia, unspecified: Secondary | ICD-10-CM

## 2024-05-31 LAB — CUP PACEART REMOTE DEVICE CHECK
Battery Remaining Longevity: 25 mo
Battery Remaining Percentage: 24 %
Battery Voltage: 2.93 V
Brady Statistic RV Percent Paced: 1 %
Date Time Interrogation Session: 20250624020017
HighPow Impedance: 69 Ohm
HighPow Impedance: 69 Ohm
Implantable Lead Connection Status: 753985
Implantable Lead Implant Date: 20160323
Implantable Lead Location: 753860
Implantable Pulse Generator Implant Date: 20160323
Lead Channel Impedance Value: 360 Ohm
Lead Channel Pacing Threshold Amplitude: 0.75 V
Lead Channel Pacing Threshold Pulse Width: 0.5 ms
Lead Channel Sensing Intrinsic Amplitude: 12 mV
Lead Channel Setting Pacing Amplitude: 2.5 V
Lead Channel Setting Pacing Pulse Width: 0.5 ms
Lead Channel Setting Sensing Sensitivity: 0.5 mV
Pulse Gen Serial Number: 7255600
Zone Setting Status: 755011

## 2024-05-31 NOTE — Progress Notes (Signed)
 Specialty Pharmacy Refill Coordination Note  Kyle Bullock is a 73 y.o. male contacted today regarding refills of specialty medication(s) Pirfenidone    Patient requested (Patient-Rptd) Delivery   Delivery date: 06/02/24   Verified address: (Patient-Rptd) [address] [place], [place], [place]. [address]   Medication will be filled on 06.25.25.

## 2024-05-31 NOTE — Progress Notes (Signed)
 Daily Session Note  Patient Details  Name: Kyle Bullock MRN: [id number] Date of Birth: 1951-10-27 Referring Provider:   Conrad Ports Pulmonary Rehab Walk Test from 05/11/2024 in [facility] for Heart, Vascular, & Lung Health  Referring Provider [name]    Encounter Date: 05/31/2024  Check In:  Session Check In - 05/31/24 1027       Check-In   Supervising physician immediately available to respond to emergencies [facility] immediately available    Physician(s) Josefa Beauvais, NP    [facility]    Staff Present Johnnie Moats, MS, ACSM-CEP, Exercise Physiologist;Randi Midge HECKLE, ACSM-CEP, Exercise Physiologist;Mary Harvy, RN, Wetzel Sharps, RT    Virtual Visit No    Medication changes reported     No    Fall or balance concerns reported    No    Tobacco Cessation No Change    Warm-up and Cool-down Performed as group-led instruction    Resistance Training Performed Yes    VAD Patient? No    PAD/SET Patient? No      Pain Assessment   Currently in Pain? No/denies    Multiple Pain Sites No          Capillary Blood Glucose: No results found for this or any previous visit (from the past 24 hours).   Exercise Prescription Changes - 05/31/24 1100       Response to Exercise   Blood Pressure (Admit) 94/44    Blood Pressure (Exercise) 100/52    Blood Pressure (Exit) 92/52    Heart Rate (Admit) 61 bpm    Heart Rate (Exercise) 101 bpm    Heart Rate (Exit) 66 bpm    Oxygen Saturation (Admit) 96 %    Oxygen Saturation (Exercise) 89 %    Oxygen Saturation (Exit) 94 %    Rating of Perceived Exertion (Exercise) 11    Perceived Dyspnea (Exercise) 1    Duration Continue with 30 min of aerobic exercise without signs/symptoms of physical distress.    Intensity THRR unchanged      Progression   Progression Continue to progress workloads to maintain intensity without signs/symptoms of physical distress.      Resistance Training    Training Prescription Yes    Weight blue bands    Reps 10-15    Time 10 Minutes      Interval Training   Interval Training No      Treadmill   MPH 2.5    Grade 0    Minutes 15    METs 2.8      Bike   Level 2    Minutes 15    METs 3          Social History   Tobacco Use  Smoking Status Former   Current packs/day: 0.00   Average packs/day: 2.0 packs/day for 40.0 years (80.0 ttl pk-yrs)   Types: Cigarettes   Start date: 11/10/1974   Quit date: 11/10/2014   Years since quitting: 9.5  Smokeless Tobacco Never    Goals Met:  Proper associated with RPD/PD & O2 Sat Independence with exercise equipment Exercise tolerated well No report of concerns or symptoms today Strength training completed today  Goals Unmet:  Not Applicable  Comments: Service time is from 1010 to 1139.    Dr. Slater Staff is Medical Director for Pulmonary Rehab at [facility].

## 2024-06-02 ENCOUNTER — Ambulatory Visit: Payer: Self-pay | Admitting: Cardiology

## 2024-06-02 ENCOUNTER — Encounter (HOSPITAL_COMMUNITY)
Admission: RE | Admit: 2024-06-02 | Discharge: 2024-06-02 | Disposition: A | Discharge status: Home / Self Care | Source: Ambulatory Visit | Attending: Internal Medicine | Admitting: Internal Medicine

## 2024-06-02 DIAGNOSIS — J849 Interstitial pulmonary disease, unspecified: Secondary | ICD-10-CM

## 2024-06-02 NOTE — Progress Notes (Signed)
 Daily Session Note  Patient Details  Name: Kyle Bullock MRN: [id number] Date of Birth: August 10, 1951 Referring Provider:   Conrad Ports Pulmonary Rehab Walk Test from 05/11/2024 in [facility] for Heart, Vascular, & Lung Health  Referring Provider [name]    Encounter Date: 06/02/2024  Check In:  Session Check In - 06/02/24 1026       Check-In   Supervising physician immediately available to respond to emergencies [facility] immediately available    Physician(s) Carolyn Mose, NP    [facility]    Staff Present Johnnie Moats, MS, ACSM-CEP, Exercise Physiologist;Randi Midge HECKLE, ACSM-CEP, Exercise Physiologist;Mary Harvy, RN, Wetzel Sharps, RT    Virtual Visit No    Medication changes reported     No    Fall or balance concerns reported    No    Tobacco Cessation No Change    Warm-up and Cool-down Performed as group-led instruction    Resistance Training Performed Yes    VAD Patient? No    PAD/SET Patient? No      Pain Assessment   Currently in Pain? No/denies    Multiple Pain Sites No          Capillary Blood Glucose: No results found for this or any previous visit (from the past 24 hours).    Social History   Tobacco Use  Smoking Status Former   Current packs/day: 0.00   Average packs/day: 2.0 packs/day for 40.0 years (80.0 ttl pk-yrs)   Types: Cigarettes   Start date: 11/10/1974   Quit date: 11/10/2014   Years since quitting: 9.5  Smokeless Tobacco Never    Goals Met:  Proper associated with RPD/PD & O2 Sat Independence with exercise equipment Exercise tolerated well No report of concerns or symptoms today Strength training completed today  Goals Unmet:  Not Applicable  Comments: Service time is from 1020 to 1142.    Dr. Slater Staff is Medical Director for Pulmonary Rehab at [facility].

## 2024-06-07 ENCOUNTER — Encounter (HOSPITAL_COMMUNITY)
Admission: RE | Admit: 2024-06-07 | Discharge: 2024-06-07 | Disposition: A | Discharge status: Home / Self Care | Source: Ambulatory Visit | Attending: Internal Medicine | Admitting: Internal Medicine

## 2024-06-07 DIAGNOSIS — J849 Interstitial pulmonary disease, unspecified: Secondary | ICD-10-CM | POA: Diagnosis not present

## 2024-06-07 NOTE — Progress Notes (Signed)
 Daily Session Note  Patient Details  Name: Kyle Bullock MRN: [id number] Date of Birth: 03/29/1951 Referring Provider:   Conrad Ports Pulmonary Rehab Walk Test from 05/11/2024 in [facility] for Heart, Vascular, & Lung Health  Referring Provider [name]    Encounter Date: 06/07/2024  Check In:  Session Check In - 06/07/24 1044       Check-In   Supervising physician immediately available to respond to emergencies [facility] immediately available    Physician(s) Jackee Alberts, NP    [facility]    Staff Present Johnnie Moats, MS, ACSM-CEP, Exercise Physiologist;Randi Midge HECKLE, ACSM-CEP, Exercise Physiologist;Jhanae Jaskowiak Harvy, RN, Wetzel Sharps, RT    Virtual Visit No    Medication changes reported     No    Fall or balance concerns reported    No    Tobacco Cessation No Change    Warm-up and Cool-down Performed as group-led instruction    Resistance Training Performed Yes    VAD Patient? No    PAD/SET Patient? No      Pain Assessment   Currently in Pain? No/denies    Multiple Pain Sites No          Capillary Blood Glucose: No results found for this or any previous visit (from the past 24 hours).    Social History   Tobacco Use  Smoking Status Former   Current packs/day: 0.00   Average packs/day: 2.0 packs/day for 40.0 years (80.0 ttl pk-yrs)   Types: Cigarettes   Start date: 11/10/1974   Quit date: 11/10/2014   Years since quitting: 9.5  Smokeless Tobacco Never    Goals Met:  Independence with exercise equipment Exercise tolerated well No report of concerns or symptoms today Strength training completed today  Goals Unmet:  Not Applicable  Comments: Service time is from 1018 to 1140    Dr. Slater Staff is Medical Director for Pulmonary Rehab at [facility].

## 2024-06-08 DIAGNOSIS — G4733 Obstructive sleep apnea (adult) (pediatric): Secondary | ICD-10-CM | POA: Diagnosis not present

## 2024-06-09 ENCOUNTER — Encounter (HOSPITAL_COMMUNITY)
Admission: RE | Admit: 2024-06-09 | Discharge: 2024-06-09 | Disposition: A | Discharge status: Home / Self Care | Source: Ambulatory Visit | Attending: Internal Medicine | Admitting: Internal Medicine

## 2024-06-09 DIAGNOSIS — J849 Interstitial pulmonary disease, unspecified: Secondary | ICD-10-CM | POA: Diagnosis not present

## 2024-06-09 NOTE — Progress Notes (Signed)
 Daily Session Note  Patient Details  Name: TYRION GLAUDE MRN: [id number] Date of Birth: [birth year] Referring Provider:   Conrad Ports Pulmonary Rehab Walk Test from 05/11/2024 in [facility] for Heart, Vascular, & Lung Health  Referring Provider [name]    Encounter Date: 06/09/2024  Check In:  Session Check In - 06/09/24 1037       Check-In   Supervising physician immediately available to respond to emergencies [facility] immediately available    Physician(s) Lum Louis, NP    [facility]    Staff Present Johnnie Moats, MS, ACSM-CEP, Exercise Physiologist;Randi Midge HECKLE, ACSM-CEP, Exercise Physiologist;Audriella Blakeley Harvy, RN, BSN;Other;Samantha Belarus, RD, LDN    Virtual Visit No    Medication changes reported     No    Fall or balance concerns reported    No    Tobacco Cessation No Change    Warm-up and Cool-down Performed as group-led instruction    Resistance Training Performed Yes    VAD Patient? No    PAD/SET Patient? No      Pain Assessment   Currently in Pain? No/denies          Capillary Blood Glucose: No results found for this or any previous visit (from the past 24 hours).    Social History   Tobacco Use  Smoking Status Former   Current packs/day: 0.00   Average packs/day: 2.0 packs/day for 40.0 years (80.0 ttl pk-yrs)   Types: Cigarettes   Start date: 11/10/1974   Quit date: 11/10/2014   Years since quitting: 9.5  Smokeless Tobacco Never    Goals Met:  Independence with exercise equipment Exercise tolerated well No report of concerns or symptoms today Strength training completed today  Goals Unmet:  Not Applicable  Comments: Service time is from 1017 to 1150    Dr. Slater Staff is Medical Director for Pulmonary Rehab at [facility].

## 2024-06-14 ENCOUNTER — Encounter (HOSPITAL_COMMUNITY)
Admission: RE | Admit: 2024-06-14 | Discharge: 2024-06-14 | Disposition: A | Discharge status: Home / Self Care | Source: Ambulatory Visit | Attending: Internal Medicine

## 2024-06-14 VITALS — Wt 210.5 lb

## 2024-06-14 DIAGNOSIS — J849 Interstitial pulmonary disease, unspecified: Secondary | ICD-10-CM | POA: Diagnosis not present

## 2024-06-14 NOTE — Progress Notes (Signed)
 Daily Session Note  Patient Details  Name: Kyle Bullock MRN: [id number] Date of Birth: Jun 07, 1951 Referring Provider:   Conrad Ports Pulmonary Rehab Walk Test from 05/11/2024 in [facility] for Heart, Vascular, & Lung Health  Referring Provider [name]    Encounter Date: 06/14/2024  Check In:  Session Check In - 06/14/24 1023       Check-In   Supervising physician immediately available to respond to emergencies [facility] immediately available    Physician(s) Damien Braver, NP    [facility]    Staff Present Johnnie Moats, MS, ACSM-CEP, Exercise Physiologist;Mary Harvy, RN, Wetzel Sharps, RT    Virtual Visit No    Medication changes reported     No    Fall or balance concerns reported    No    Tobacco Cessation No Change    Warm-up and Cool-down Performed as group-led instruction    Resistance Training Performed Yes    VAD Patient? No    PAD/SET Patient? No      Pain Assessment   Currently in Pain? No/denies    Multiple Pain Sites No          Capillary Blood Glucose: No results found for this or any previous visit (from the past 24 hours).   Exercise Prescription Changes - 06/14/24 1100       Response to Exercise   Blood Pressure (Admit) 100/54    Blood Pressure (Exercise) 100/60    Blood Pressure (Exit) 98/56    Heart Rate (Admit) 61 bpm    Heart Rate (Exercise) 81 bpm    Heart Rate (Exit) 62 bpm    Oxygen Saturation (Admit) 95 %    Oxygen Saturation (Exercise) 93 %    Oxygen Saturation (Exit) 95 %    Rating of Perceived Exertion (Exercise) 11    Perceived Dyspnea (Exercise) 2    Duration Continue with 30 min of aerobic exercise without signs/symptoms of physical distress.    Intensity THRR unchanged      Progression   Progression Continue to progress workloads to maintain intensity without signs/symptoms of physical distress.      Resistance Training   Training Prescription Yes    Weight blue bands     Reps 10-15    Time 10 Minutes      Oxygen   Liters 2      Treadmill   MPH 2.5    Grade 1    Minutes 15    METs 3.1      Bike   Level 3    Minutes 15    METs 2.8          Social History   Tobacco Use  Smoking Status Former   Current packs/day: 0.00   Average packs/day: 2.0 packs/day for 40.0 years (80.0 ttl pk-yrs)   Types: Cigarettes   Start date: 11/10/1974   Quit date: 11/10/2014   Years since quitting: 9.6  Smokeless Tobacco Never    Goals Met:  Proper associated with RPD/PD & O2 Sat Independence with exercise equipment Exercise tolerated well No report of concerns or symptoms today Strength training completed today  Goals Unmet:  Not Applicable  Comments: Service time is from 1011 to 1137.    Dr. Slater Staff is Medical Director for Pulmonary Rehab at [facility].

## 2024-06-15 NOTE — Progress Notes (Signed)
 Pulmonary Individual Treatment Plan  Patient Details  Name: Kyle Bullock MRN: [id number] Date of Birth: Apr 07, 1951 Referring Provider:   Conrad Ports Pulmonary Rehab Walk Test from 05/11/2024 in [facility]  Referring Provider [name]    Initial Encounter Date:  Flowsheet Row Pulmonary Rehab Walk Test from 05/11/2024 in [facility], [facility]  Date 05/11/24    Visit Diagnosis: ILD (interstitial lung disease) (HCC)  Patient's Home Medications on Admission:   Current Outpatient Medications:    acetaminophen  (TYLENOL ) 500 MG tablet, Take 500 mg by mouth every 8 (eight) hours as needed for moderate pain., Disp: , Rfl:    atorvastatin  (LIPITOR) 20 MG tablet, TAKE 1 TABLET BY MOUTH DAILY, Disp: 90 tablet, Rfl: 3   cyanocobalamin  (VITAMIN B12) 1000 MCG tablet, Take 1,000 mcg by mouth daily., Disp: , Rfl:    ELIQUIS  5 MG TABS tablet, TAKE 1 TABLET BY MOUTH 2 TIMES A DAY, Disp: 60 tablet, Rfl: 5   furosemide  (LASIX ) 20 MG tablet, TAKE 1 TABLET BY MOUTH DAILY AS NEEDED, Disp: 30 tablet, Rfl: 2   levothyroxine (SYNTHROID) 112 MCG tablet, Take 112 mcg by mouth daily before breakfast., Disp: , Rfl:    metoprolol  succinate (TOPROL -XL) 25 MG 24 hr tablet, Take 1 tablet (25 mg total) by mouth at bedtime., Disp: 90 tablet, Rfl: 3   nitroGLYCERIN  (NITROSTAT ) 0.4 MG SL tablet, PLACE 1 TABLET UNDER THE TONGUE EVERY 5 MINUTES AS NEEDED FOR CHEST PAIN, Disp: 25 tablet, Rfl: 3   omega-3 acid ethyl esters (LOVAZA) 1 g capsule, Take 2 g by mouth 2 (two) times daily., Disp: , Rfl:    Pirfenidone  267 MG TABS, Take 3 tablets (801 mg total) by mouth 3 (three) times daily with meals., Disp: 270 tablet, Rfl: 4   Probiotic Product (PROBIOTIC PO), Take 1 tablet by mouth every other day., Disp: , Rfl:    sacubitril -valsartan  (ENTRESTO ) 24-26 MG, Take 1 tablet by mouth 2 (two) times daily., Disp: 180 tablet, Rfl:  3   spironolactone  (ALDACTONE ) 25 MG tablet, Take 1 tablet (25 mg total) by mouth daily. (Patient taking differently: Take 12.5 mg by mouth daily.), Disp: 30 tablet, Rfl: 11   TRELEGY ELLIPTA 100-62.5-25 MCG/ACT AEPB, Take 1 puff by mouth daily., Disp: , Rfl:   Past Medical History: Past Medical History:  Diagnosis Date   Abnormal TSH    a. 11/2014: felt d/t sick euthyroid.   AICD (automatic cardioverter/defibrillator) present 02/28/2015   CAD (coronary artery disease)    a. 11/2014: arrest/STEMI s/p Xience DES to the LAD and PTCA to the D1 [place], [place]). b. Relook cath 11/2014: no culprit, patent stent.   CHF (congestive heart failure) (HCC)    Dressler's syndrome (HCC)    a. 11/2014: tx with colchicine  and prednisone .   Dyslipidemia    Dyspnea    a. 11/2014: possibly due to combo of CHF and Brilinta . Brilinta  changed to Effient .   Erectile dysfunction    a. Pt aware not to take ED med within 24 hr of NTG and vice versa.   Hypertension    Ischemic cardiomyopathy    a. 11/2014: EF reportedly 20% by cath and echo in [place].   NSVT (nonsustained ventricular tachycardia) (HCC) 11/16/14   a. 11/2014: admitted after discharge from STEMI admission, 18 beats 135-140bpm in ED.   Pericardial effusion    a. 11/2014.   Pericarditis dx'd 11/2014   Pneumonia  1990's X 2   RBBB    Sleep apnea    suspected but not diagnosed (02/28/2015)   STEMI (ST elevation myocardial infarction) (HCC) 11/10/2014   Tobacco abuse    VF (ventricular fibrillation) (HCC) 11/10/14   a. 11/2014: arrest with STEMI.    Tobacco Use: Social History   Tobacco Use  Smoking Status Former   Current packs/day: 0.00   Average packs/day: 2.0 packs/day for 40.0 years (80.0 ttl pk-yrs)   Types: Cigarettes   Start date: 11/10/1974   Quit date: 11/10/2014   Years since quitting: 9.6  Smokeless Tobacco Never    Labs: Review Flowsheet       Latest Ref Rng & Units 12/04/2014 01/08/2015 07/22/2021 11/07/2021  Labs for ITP  Cardiac and Pulmonary Rehab  Cholestrol 0 - 200 mg/dL 898  891  - 872   LDL (calc) 0 - 99 mg/dL 39  38  - 42   HDL-C >59 mg/dL 43  60.99  - 51   Trlycerides <150 mg/dL 94  844.9  - 829   PH, Arterial 7.350 - 7.450 - - 7.385  -  PCO2 arterial 32.0 - 48.0 mmHg - - 39.6  -  Bicarbonate 20.0 - 28.0 mmol/L - - 19.3  24.4  23.7  -  TCO2 22 - 32 mmol/L - - 20  26  25   -  Acid-base deficit 0.0 - 2.0 mmol/L - - 5.0  1.0  1.0  -  O2 Saturation % - - 74.0  74.0  99.0  -    Details       Multiple values from one day are sorted in reverse-chronological order         Capillary Blood Glucose: Lab Results  Component Value Date   GLUCAP 88 09/02/2020     Pulmonary Assessment Scores:  Pulmonary Assessment Scores     Row Name 05/11/24 1102         ADL UCSD   ADL Phase Entry     SOB Score total 30       CAT Score   CAT Score 13       mMRC Score   mMRC Score 1       UCSD: Self-administered rating of dyspnea associated with activities of daily living (ADLs) 6-point scale (0 = not at all to 5 = maximal or unable to do because of breathlessness)  Scoring Scores range from 0 to 120.  Minimally important difference is 5 units  CAT: CAT can identify the health impairment of COPD patients and is better correlated with disease progression.  CAT has a scoring range of zero to 40. The CAT score is classified into four groups of low (less than 10), medium (10 - 20), high (21-30) and very high (31-40) based on the impact level of disease on health status. A CAT score over 10 suggests significant symptoms.  A worsening CAT score could be explained by an exacerbation, poor medication adherence, poor inhaler technique, or progression of COPD or comorbid conditions.  CAT MCID is 2 points  mMRC: mMRC (Modified Medical Research Council) Dyspnea Scale is used to assess the degree of baseline functional disability in patients of respiratory disease due to dyspnea. No minimal important difference  is established. A decrease in score of 1 point or greater is considered a positive change.   Pulmonary Function Assessment:  Pulmonary Function Assessment - 05/11/24 1154       Breath   Shortness of Breath Yes;Limiting activity  Exercise Target Goals: Exercise Program Goal: Individual exercise prescription set using results from initial 6 min walk test and THRR while considering  patient's activity barriers and safety.   Exercise Prescription Goal: Initial exercise prescription builds to 30-45 minutes a day of aerobic activity, 2-3 days per week.  Home exercise guidelines will be given to patient during program as part of exercise prescription that the participant will acknowledge.  Activity Barriers & Risk Stratification:  Activity Barriers & Cardiac Risk Stratification - 05/11/24 1102       Activity Barriers & Cardiac Risk Stratification   Activity Barriers Shortness of Breath;Muscular Weakness;Deconditioning    Comments Rt hip arthritis          6 Minute Walk:  6 Minute Walk     Row Name 05/11/24 1150         6 Minute Walk   Phase Initial     Distance 1050 feet     Walk Time 6 minutes     # of Rest Breaks 0     MPH 1.99     METS 2.22     RPE 9     Perceived Dyspnea  1     VO2 Peak 7.75     Symptoms No     Resting HR 60 bpm     Resting BP 90/60     Resting Oxygen Saturation  94 %     Exercise Oxygen Saturation  during 6 min walk 90 %     Max Ex. HR 99 bpm     Max Ex. BP 102/56     2 Minute Post BP 104/56       Interval HR   1 Minute HR 85     2 Minute HR 98     3 Minute HR 99     4 Minute HR 91     5 Minute HR 88     6 Minute HR 99     2 Minute Post HR 57     Interval Heart Rate? Yes       Interval Oxygen   Interval Oxygen? Yes     Baseline Oxygen Saturation % 94 %     1 Minute Oxygen Saturation % 94 %     1 Minute Liters of Oxygen 0 L     2 Minute Oxygen Saturation % 90 %     2 Minute Liters of Oxygen 0 L     3 Minute Oxygen  Saturation % 92 %     3 Minute Liters of Oxygen 0 L     4 Minute Oxygen Saturation % 90 %     4 Minute Liters of Oxygen 0 L     5 Minute Oxygen Saturation % 90 %     5 Minute Liters of Oxygen 0 L     6 Minute Oxygen Saturation % 91 %     6 Minute Liters of Oxygen 0 L     2 Minute Post Oxygen Saturation % 95 %     2 Minute Post Liters of Oxygen 0 L        Oxygen Initial Assessment:  Oxygen Initial Assessment - 05/13/24 0759       Home Oxygen   Home Oxygen Device None    Sleep Oxygen Prescription CPAP    Home Exercise Oxygen Prescription None    Home Resting Oxygen Prescription None    Compliance with Home Oxygen Use No      Initial  6 min Walk   Oxygen Used None      Program Oxygen Prescription   Program Oxygen Prescription None      Intervention   Short Term Goals To learn and understand importance of maintaining oxygen saturations>88%;To learn and demonstrate proper use of respiratory medications;To learn and understand importance of monitoring SPO2 with pulse oximeter and demonstrate accurate use of the pulse oximeter.;To learn and demonstrate proper pursed lip breathing techniques or other breathing techniques. ;To learn and exhibit compliance with exercise, home and travel O2 prescription    Long  Term Goals Exhibits compliance with exercise, home  and travel O2 prescription;Verbalizes importance of monitoring SPO2 with pulse oximeter and return demonstration;Maintenance of O2 saturations>88%;Exhibits proper breathing techniques, such as pursed lip breathing or other method taught during program session;Compliance with respiratory medication;Demonstrates proper use of MDI's          Oxygen Re-Evaluation:  Oxygen Re-Evaluation     Row Name 05/11/24 1057 06/13/24 0857           Program Oxygen Prescription   Program Oxygen Prescription -- None        Home Oxygen   Home Oxygen Device -- None      Sleep Oxygen Prescription -- CPAP      Home Exercise Oxygen  Prescription -- None      Home Resting Oxygen Prescription -- None      Compliance with Home Oxygen Use -- No        Goals/Expected Outcomes   Short Term Goals -- To learn and understand importance of maintaining oxygen saturations>88%;To learn and demonstrate proper use of respiratory medications;To learn and understand importance of monitoring SPO2 with pulse oximeter and demonstrate accurate use of the pulse oximeter.;To learn and demonstrate proper pursed lip breathing techniques or other breathing techniques. ;To learn and exhibit compliance with exercise, home and travel O2 prescription      Long  Term Goals -- Exhibits compliance with exercise, home  and travel O2 prescription;Verbalizes importance of monitoring SPO2 with pulse oximeter and return demonstration;Maintenance of O2 saturations>88%;Exhibits proper breathing techniques, such as pursed lip breathing or other method taught during program session;Compliance with respiratory medication;Demonstrates proper use of MDI's      Goals/Expected Outcomes Compliance and understanding of oxygen saturation monitoring and breathing techniques to decrease shortness of breath. Compliance and understanding of oxygen saturation monitoring and breathing techniques to decrease shortness of breath.         Oxygen Discharge (Final Oxygen Re-Evaluation):  Oxygen Re-Evaluation - 06/13/24 0857       Program Oxygen Prescription   Program Oxygen Prescription None      Home Oxygen   Home Oxygen Device None    Sleep Oxygen Prescription CPAP    Home Exercise Oxygen Prescription None    Home Resting Oxygen Prescription None    Compliance with Home Oxygen Use No      Goals/Expected Outcomes   Short Term Goals To learn and understand importance of maintaining oxygen saturations>88%;To learn and demonstrate proper use of respiratory medications;To learn and understand importance of monitoring SPO2 with pulse oximeter and demonstrate accurate use of the  pulse oximeter.;To learn and demonstrate proper pursed lip breathing techniques or other breathing techniques. ;To learn and exhibit compliance with exercise, home and travel O2 prescription    Long  Term Goals Exhibits compliance with exercise, home  and travel O2 prescription;Verbalizes importance of monitoring SPO2 with pulse oximeter and return demonstration;Maintenance of O2 saturations>88%;Exhibits proper breathing techniques, such  as pursed lip breathing or other method taught during program session;Compliance with respiratory medication;Demonstrates proper use of MDI's    Goals/Expected Outcomes Compliance and understanding of oxygen saturation monitoring and breathing techniques to decrease shortness of breath.          Initial Exercise Prescription:  Initial Exercise Prescription - 05/11/24 1200       Date of Initial Exercise RX and Referring Provider   Date 05/11/24    Referring Provider [name]    Expected Discharge Date 08/09/24      Treadmill   MPH 2.5    Grade 1.5    Minutes 15      Bike   Level 1    Watts 40    Minutes 15    METs 2.5      Prescription Details   Frequency (times per week) 2    Duration Progress to 30 minutes of continuous aerobic without signs/symptoms of physical distress      Intensity   THRR 40-80% of Max Heartrate 58-117    Ratings of Perceived Exertion 11-13    Perceived Dyspnea 0-4      Progression   Progression Continue progressive overload as per policy without signs/symptoms or physical distress.      Resistance Training   Training Prescription Yes    Weight blue bands    Reps 10-15          Perform Capillary Blood Glucose checks as needed.  Exercise Prescription Changes:   Exercise Prescription Changes     Row Name 05/31/24 1100 06/14/24 1100           Response to Exercise   Blood Pressure (Admit) 94/44 100/54      Blood Pressure (Exercise) 100/52 100/60      Blood Pressure (Exit) 92/52 98/56      Heart Rate  (Admit) 61 bpm 61 bpm      Heart Rate (Exercise) 101 bpm 81 bpm      Heart Rate (Exit) 66 bpm 62 bpm      Oxygen Saturation (Admit) 96 % 95 %      Oxygen Saturation (Exercise) 89 % 93 %      Oxygen Saturation (Exit) 94 % 95 %      Rating of Perceived Exertion (Exercise) 11 11      Perceived Dyspnea (Exercise) 1 2      Duration Continue with 30 min of aerobic exercise without signs/symptoms of physical distress. Continue with 30 min of aerobic exercise without signs/symptoms of physical distress.      Intensity THRR unchanged THRR unchanged        Progression   Progression Continue to progress workloads to maintain intensity without signs/symptoms of physical distress. Continue to progress workloads to maintain intensity without signs/symptoms of physical distress.        Resistance Training   Training Prescription Yes Yes      Weight blue bands blue bands      Reps 10-15 10-15      Time 10 Minutes 10 Minutes        Interval Training   Interval Training No --        Oxygen   Liters -- 2        Treadmill   MPH 2.5 2.5      Grade 0 1      Minutes 15 15      METs 2.8 3.1        Bike   Level 2 3  Minutes 15 15      METs 3 2.8         Exercise Comments:   Exercise Comments     Row Name 05/24/24 1517           Exercise Comments Pt completed his first day of exercise. He exercised for 15 min on the upright bike and treadmill. Jay averaged 2.6 METs at level 2 on the upright bike and 2.8 METs at 2.5 mph on the treadmill. Gordy perfomed the warmup and cooldown standing without limitations. Discussed METs with good reception.          Exercise Goals and Review:   Exercise Goals     Row Name 05/11/24 1152             Exercise Goals   Increase Physical Activity Yes       Intervention Provide advice, education, support and counseling about physical activity/exercise needs.;Develop an individualized exercise prescription for aerobic and resistive training based on  initial evaluation findings, risk stratification, comorbidities and participant's personal goals.       Expected Outcomes Short Term: Attend rehab on a regular basis to increase amount of physical activity.;Long Term: Add in home exercise to make exercise part of routine and to increase amount of physical activity.;Long Term: Exercising regularly at least 3-5 days a week.       Increase Strength and Stamina Yes       Intervention Provide advice, education, support and counseling about physical activity/exercise needs.;Develop an individualized exercise prescription for aerobic and resistive training based on initial evaluation findings, risk stratification, comorbidities and participant's personal goals.       Expected Outcomes Short Term: Increase workloads from initial exercise prescription for resistance, speed, and METs.;Short Term: Perform resistance training exercises routinely during rehab and add in resistance training at home;Long Term: Improve cardiorespiratory fitness, muscular endurance and strength as measured by increased METs and functional capacity ( )       Able to understand and use rate of perceived exertion (RPE) scale Yes       Intervention Provide education and explanation on how to use RPE scale       Expected Outcomes Short Term: Able to use RPE daily in rehab to express subjective intensity level;Long Term:  Able to use RPE to guide intensity level when exercising independently       Able to understand and use Dyspnea scale Yes       Intervention Provide education and explanation on how to use Dyspnea scale       Expected Outcomes Short Term: Able to use Dyspnea scale daily in rehab to express subjective sense of shortness of breath during exertion;Long Term: Able to use Dyspnea scale to guide intensity level when exercising independently       Knowledge and understanding of Target Heart Rate Range (THRR) Yes       Intervention Provide education and explanation of THRR  including how the numbers were predicted and where they are located for reference       Expected Outcomes Short Term: Able to state/look up THRR;Long Term: Able to use THRR to govern intensity when exercising independently;Short Term: Able to use daily as guideline for intensity in rehab       Understanding of Exercise Prescription Yes       Intervention Provide education, explanation, and written materials on patient's individual exercise prescription       Expected Outcomes Short Term: Able to explain program exercise prescription;Long Term:  Able to explain home exercise prescription to exercise independently          Exercise Goals Re-Evaluation :  Exercise Goals Re-Evaluation     Row Name 05/11/24 1152 06/13/24 0854           Exercise Goal Re-Evaluation   Exercise Goals Review Increase Physical Activity;Able to understand and use Dyspnea scale;Understanding of Exercise Prescription;Increase Strength and Stamina;Knowledge and understanding of Target Heart Rate Range (THRR);Able to understand and use rate of perceived exertion (RPE) scale Increase Physical Activity;Able to understand and use Dyspnea scale;Understanding of Exercise Prescription;Increase Strength and Stamina;Knowledge and understanding of Target Heart Rate Range (THRR);Able to understand and use rate of perceived exertion (RPE) scale      Comments Patient is scheduled to begin exercise on 6/12. Gordy has completed 6 exercise sessions. He exercises for 15 min on the treadmill and upright bike. He averages 2.8 METs at level 2 on the upright bike and 3.1 METs at 2.5 mph and 1% incline on the treadmill. He performs the warmup and cooldown standing without limitations. He has increased his incline on the treadmill. Upright bike level remains the same. Will encourage him to increase the level on the upright bike. Will continue to monitor and progress as able.      Expected Outcomes Through exercise at rehab and home, the patient will  decrease shortness of breath with daily activities and feel confident in carrying out an exercise regimen at home. Through exercise at rehab and home, the patient will decrease shortness of breath with daily activities and feel confident in carrying out an exercise regimen at home.         Discharge Exercise Prescription (Final Exercise Prescription Changes):  Exercise Prescription Changes - 06/14/24 1100       Response to Exercise   Blood Pressure (Admit) 100/54    Blood Pressure (Exercise) 100/60    Blood Pressure (Exit) 98/56    Heart Rate (Admit) 61 bpm    Heart Rate (Exercise) 81 bpm    Heart Rate (Exit) 62 bpm    Oxygen Saturation (Admit) 95 %    Oxygen Saturation (Exercise) 93 %    Oxygen Saturation (Exit) 95 %    Rating of Perceived Exertion (Exercise) 11    Perceived Dyspnea (Exercise) 2    Duration Continue with 30 min of aerobic exercise without signs/symptoms of physical distress.    Intensity THRR unchanged      Progression   Progression Continue to progress workloads to maintain intensity without signs/symptoms of physical distress.      Resistance Training   Training Prescription Yes    Weight blue bands    Reps 10-15    Time 10 Minutes      Oxygen   Liters 2      Treadmill   MPH 2.5    Grade 1    Minutes 15    METs 3.1      Bike   Level 3    Minutes 15    METs 2.8          Nutrition:  Target Goals: Understanding of nutrition guidelines, daily intake of sodium 1500mg , cholesterol 200mg , calories 30% from fat and 7% or less from saturated fats, daily to have 5 or more servings of fruits and vegetables.  Biometrics:    Nutrition Therapy Plan and Nutrition Goals:  Nutrition Therapy & Goals - 05/24/24 1142       Nutrition Therapy   Diet Heart Healthy Diet  Drug/Food Interactions Statins/Certain Fruits      Personal Nutrition Goals   Nutrition Goal Patient to improve diet quality by using the plate method as a guide for meal planning to  include lean protein/plant protein, fruits, vegetables, whole grains, nonfat dairy as part of a well-balanced diet.    Comments Gordy has medical history of chronic systolic heart failure, CAD w/ MI 2015, afib, hx of tobacco abuse, emphysema/ILD, OSA. He is taking esbriet  for IPF; he reports side effects of rash but reports stable appetite/eating patterns. Patient will benefit from participation in pulmonary rehab for nutrition, exercise, and lifestyle modification.      Intervention Plan   Intervention Nutrition handout(s) given to patient.;Prescribe, educate and counsel regarding individualized specific dietary modifications aiming towards targeted core components such as weight, hypertension, lipid management, diabetes, heart failure and other comorbidities.    Expected Outcomes Short Term Goal: Understand basic principles of dietary content, such as calories, fat, sodium, cholesterol and nutrients.;Long Term Goal: Adherence to prescribed nutrition plan.          Nutrition Assessments:  Nutrition Assessments - 05/24/24 1204       Rate Your Plate Scores   Pre Score 63         MEDIFICTS Score Key: >=70 Need to make dietary changes  40-70 Heart Healthy Diet <= 40 Therapeutic Level Cholesterol Diet  Flowsheet Row PULMONARY REHAB OTHER RESPIRATORY from 05/24/2024 in [facility]  Picture Your Plate Total Score on Admission 63   Picture Your Plate Scores: <59 Unhealthy dietary pattern with much room for improvement. 41-50 Dietary pattern unlikely to meet recommendations for good health and room for improvement. 51-60 More healthful dietary pattern, with some room for improvement.  >60 Healthy dietary pattern, although there may be some specific behaviors that could be improved.    Nutrition Goals Re-Evaluation:  Nutrition Goals Re-Evaluation     Row Name 05/24/24 1142             Goals   Current Weight 210 lb 1.6 oz  (95.3 kg)       Comment no recent lipid panel for review. hepatic function panel WNL, BNP 778       Expected Outcome Gordy has medical history of chronic systolic heart failure, CAD w/ MI 2015, afib, hx of tobacco abuse, emphysema/ILD, OSA. He is taking esbriet  for IPF; he reports side effects of rash but reports stable appetite/eating patterns. Patient will benefit from participation in pulmonary rehab for nutrition, exercise, and lifestyle modification.          Nutrition Goals Discharge (Final Nutrition Goals Re-Evaluation):  Nutrition Goals Re-Evaluation - 05/24/24 1142       Goals   Current Weight 210 lb 1.6 oz (95.3 kg)    Comment no recent lipid panel for review. hepatic function panel WNL, BNP 778    Expected Outcome Gordy has medical history of chronic systolic heart failure, CAD w/ MI 2015, afib, hx of tobacco abuse, emphysema/ILD, OSA. He is taking esbriet  for IPF; he reports side effects of rash but reports stable appetite/eating patterns. Patient will benefit from participation in pulmonary rehab for nutrition, exercise, and lifestyle modification.          Psychosocial: Target Goals: Acknowledge presence or absence of significant depression and/or stress, maximize coping skills, provide positive support system. Participant is able to verbalize types and ability to use techniques and skills needed for reducing stress and depression.  Initial  Review & Psychosocial Screening:  Initial Psych Review & Screening - 05/11/24 1055       Initial Review   Current issues with None Identified      Family Dynamics   Good Support System? Yes    Comments spouse, children      Barriers   Psychosocial barriers to participate in program There are no identifiable barriers or psychosocial needs.      Screening Interventions   Interventions Encouraged to exercise          Quality of Life Scores:  Scores of 19 and below usually indicate a poorer quality of life in these areas.  A  difference of  2-3 points is a clinically meaningful difference.  A difference of 2-3 points in the total score of the Quality of Life Index has been associated with significant improvement in overall quality of life, self-image, physical symptoms, and general health in studies assessing change in quality of life.  PHQ-9: Review Flowsheet       05/11/2024 04/25/2015 12/27/2014  Depression screen PHQ 2/9  Decreased Interest 0 0 0  Down, Depressed, Hopeless 0 0 1  PHQ - 2 Score 0 0 1  Altered sleeping 1 - -  Tired, decreased energy 1 - -  Change in appetite 0 - -  Feeling bad or failure about yourself  0 - -  Trouble concentrating 0 - -  Moving slowly or fidgety/restless 0 - -  Suicidal thoughts 0 - -  PHQ-9 Score 2 - -  Difficult doing work/chores Not difficult at all - -   Interpretation of Total Score  Total Score Depression Severity:  1-4 = Minimal depression, 5-9 = Mild depression, 10-14 = Moderate depression, 15-19 = Moderately severe depression, 20-27 = Severe depression   Psychosocial Evaluation and Intervention:  Psychosocial Evaluation - 05/11/24 1055       Psychosocial Evaluation & Interventions   Comments Gordy denies any psychosocial barriers at this time.    Expected Outcomes For Gordy to participate in rehab free of psychosocial barriers.    Continue Psychosocial Services  No Follow up required          Psychosocial Re-Evaluation:  Psychosocial Re-Evaluation     Row Name 05/13/24 1328 06/02/24 0915           Psychosocial Re-Evaluation   Current issues with None Identified None Identified      Comments Gordy is scheduled to start PR on 05/19/24. No new psychosocial barriers or concerns at this time. Gordy continues to deny any new psychosocial barriers or concerns      Expected Outcomes For Gordy to participate in PR free of any psychosocial barriers or concerns For Gordy to participate in PR free of any psychosocial barriers or concerns      Interventions Encouraged to  attend Pulmonary Rehabilitation for the exercise Encouraged to attend Pulmonary Rehabilitation for the exercise      Continue Psychosocial Services  No Follow up required No Follow up required         Psychosocial Discharge (Final Psychosocial Re-Evaluation):  Psychosocial Re-Evaluation - 06/02/24 0915       Psychosocial Re-Evaluation   Current issues with None Identified    Comments Gordy continues to deny any new psychosocial barriers or concerns    Expected Outcomes For Gordy to participate in PR free of any psychosocial barriers or concerns    Interventions Encouraged to attend Pulmonary Rehabilitation for the exercise    Continue Psychosocial Services  No  Follow up required          Education: Education Goals: Education classes will be provided on a weekly basis, covering required topics. Participant will state understanding/return demonstration of topics presented.  Learning Barriers/Preferences:  Learning Barriers/Preferences - 05/11/24 1056       Learning Barriers/Preferences   Learning Barriers Sight    Learning Preferences None          Education Topics: Know Your Numbers Group instruction that is supported by a PowerPoint presentation. Instructor discusses importance of knowing and understanding resting, exercise, and post-exercise oxygen saturation, heart rate, and blood pressure. Oxygen saturation, heart rate, blood pressure, rating of perceived exertion, and dyspnea are reviewed along with a normal range for these values.  Flowsheet Row PULMONARY REHAB OTHER RESPIRATORY from 06/02/2024 in [facility] for Heart, Vascular, & Lung Health  Date 06/02/24  Educator EP  Instruction Review Code 1- Verbalizes Understanding    Exercise for the Pulmonary Patient Group instruction that is supported by a PowerPoint presentation. Instructor discusses benefits of exercise, core components of exercise, frequency, duration, and intensity of an exercise  routine, importance of utilizing pulse oximetry during exercise, safety while exercising, and options of places to exercise outside of rehab.  Flowsheet Row PULMONARY REHAB OTHER RESPIRATORY from 05/26/2024 in [facility] for Heart, Vascular, & Lung Health  Date 05/26/24  Educator EP  Instruction Review Code 1- Verbalizes Understanding    MET Level  Group instruction provided by PowerPoint, verbal discussion, and written material to support subject matter. Instructor reviews what METs are and how to increase METs.    Pulmonary Medications Verbally interactive group education provided by instructor with focus on inhaled medications and proper administration.   Anatomy and Physiology of the Respiratory System Group instruction provided by PowerPoint, verbal discussion, and written material to support subject matter. Instructor reviews respiratory cycle and anatomical components of the respiratory system and their functions. Instructor also reviews differences in obstructive and restrictive respiratory diseases with examples of each.    Oxygen Safety Group instruction provided by PowerPoint, verbal discussion, and written material to support subject matter. There is an overview of "What is Oxygen" and "Why do we need it".  Instructor also reviews how to create a safe environment for oxygen use, the importance of using oxygen as prescribed, and the risks of noncompliance. There is a brief discussion on traveling with oxygen and resources the patient may utilize. Flowsheet Row PULMONARY REHAB OTHER RESPIRATORY from 06/09/2024 in [facility] for Heart, Vascular, & Lung Health  Date 06/09/24  Educator RN  Instruction Review Code 1- Verbalizes Understanding    Oxygen Use Group instruction provided by PowerPoint, verbal discussion, and written material to discuss how supplemental oxygen is prescribed and different types of oxygen supply systems.  Resources for more information are provided.    Breathing Techniques Group instruction that is supported by demonstration and informational handouts. Instructor discusses the benefits of pursed lip and diaphragmatic breathing and detailed demonstration on how to perform both.     Risk Factor Reduction Group instruction that is supported by a PowerPoint presentation. Instructor discusses the definition of a risk factor, different risk factors for pulmonary disease, and how the heart and lungs work together.   Pulmonary Diseases Group instruction provided by PowerPoint, verbal discussion, and written material to support subject matter. Instructor gives an overview of the different type of pulmonary diseases. There is also a discussion on risk factors and  symptoms as well as ways to manage the diseases.   Stress and Energy Conservation Group instruction provided by PowerPoint, verbal discussion, and written material to support subject matter. Instructor gives an overview of stress and the impact it can have on the body. Instructor also reviews ways to reduce stress. There is also a discussion on energy conservation and ways to conserve energy throughout the day.   Warning Signs and Symptoms Group instruction provided by PowerPoint, verbal discussion, and written material to support subject matter. Instructor reviews warning signs and symptoms of stroke, heart attack, cold and flu. Instructor also reviews ways to prevent the spread of infection.   Other Education Group or individual verbal, written, or video instructions that support the educational goals of the pulmonary rehab program.    Knowledge Questionnaire Score:  Knowledge Questionnaire Score - 05/11/24 1158       Knowledge Questionnaire Score   Pre Score 17/18          Core Components/Risk Factors/Patient Goals at Admission:  Personal Goals and Risk Factors at Admission - 05/11/24 1056       Core Components/Risk  Factors/Patient Goals on Admission    Weight Management Weight Loss;Yes    Intervention Weight Management: Develop a combined nutrition and exercise program designed to reach desired caloric intake, while maintaining appropriate intake of nutrient and fiber, sodium and fats, and appropriate energy expenditure required for the weight goal.;Weight Management: Provide education and appropriate resources to help participant work on and attain dietary goals.;Weight Management/Obesity: Establish reasonable short term and long term weight goals.;Obesity: Provide education and appropriate resources to help participant work on and attain dietary goals.    Expected Outcomes Short Term: Continue to assess and modify interventions until short term weight is achieved;Long Term: Adherence to nutrition and physical activity/exercise program aimed toward attainment of established weight goal;Weight Maintenance: Understanding of the daily nutrition guidelines, which includes 25-35% calories from fat, 7% or less cal from saturated fats, less than 200mg  cholesterol, less than 1.5gm of sodium, & 5 or more servings of fruits and vegetables daily;Weight Loss: Understanding of general recommendations for a balanced deficit meal plan, which promotes 1-2 lb weight loss per week and includes a negative energy balance of [phone] kcal/d;Understanding recommendations for meals to include 15-35% energy as protein, 25-35% energy from fat, 35-60% energy from carbohydrates, less than 200mg  of dietary cholesterol, 20-35 gm of total fiber daily;Understanding of distribution of calorie intake throughout the day with the consumption of 4-5 meals/snacks    Improve shortness of breath with ADL's Yes    Intervention Provide education, individualized exercise plan and daily activity instruction to help decrease symptoms of SOB with activities of daily living.    Expected Outcomes Short Term: Improve cardiorespiratory fitness to achieve a reduction  of symptoms when performing ADLs;Long Term: Be able to perform more ADLs without symptoms or delay the onset of symptoms          Core Components/Risk Factors/Patient Goals Review:   Goals and Risk Factor Review     Row Name 05/13/24 1329 06/02/24 0915           Core Components/Risk Factors/Patient Goals Review   Personal Goals Review Weight Management/Obesity;Improve shortness of breath with ADL's;Develop more efficient breathing techniques such as purse lipped breathing and diaphragmatic breathing and practicing self-pacing with activity. Weight Management/Obesity;Improve shortness of breath with ADL's;Develop more efficient breathing techniques such as purse lipped breathing and diaphragmatic breathing and practicing self-pacing with activity.  Review Gordy is scheduled to start PR on 05/19/24. Unable to assess his goals at this time. Monthly review of patient's Core Components/Risk Factors/Patient Goals are as follows: Goal progressing for improving shortness of breath with ADL's. Gordy is currently exercising on RA to maintain sats >88%. He is exercising on the treadmill and the bike. Goal progressing for developing more efficient breathing techniques such as purse lipped breathing and diaphragmatic breathing; and practicing self-pacing with activity. Goal progressing for weight loss. He is working with the staff dietitician on ways to lose weight. We will continue to monitor his progress throughout the program.      Expected Outcomes To improve shortness of breath with ADL's, develop more efficient breathing techniques such as purse lipped breathing and diaphragmatic breathing; and practicing self-pacing with activity and lose weight To improve shortness of breath with ADL's, develop more efficient breathing techniques such as purse lipped breathing and diaphragmatic breathing; and practicing self-pacing with activity and lose weight         Core Components/Risk Factors/Patient Goals at  Discharge (Final Review):   Goals and Risk Factor Review - 06/02/24 0915       Core Components/Risk Factors/Patient Goals Review   Personal Goals Review Weight Management/Obesity;Improve shortness of breath with ADL's;Develop more efficient breathing techniques such as purse lipped breathing and diaphragmatic breathing and practicing self-pacing with activity.    Review Monthly review of patient's Core Components/Risk Factors/Patient Goals are as follows: Goal progressing for improving shortness of breath with ADL's. Gordy is currently exercising on RA to maintain sats >88%. He is exercising on the treadmill and the bike. Goal progressing for developing more efficient breathing techniques such as purse lipped breathing and diaphragmatic breathing; and practicing self-pacing with activity. Goal progressing for weight loss. He is working with the staff dietitician on ways to lose weight. We will continue to monitor his progress throughout the program.    Expected Outcomes To improve shortness of breath with ADL's, develop more efficient breathing techniques such as purse lipped breathing and diaphragmatic breathing; and practicing self-pacing with activity and lose weight          ITP Comments:Pt is making expected progress toward Pulmonary Rehab goals after completing 7 session(s). Recommend continued exercise, life style modification, education, and utilization of breathing techniques to increase stamina and strength, while also decreasing shortness of breath with exertion.  Dr. Slater Staff is Medical Director for Pulmonary Rehab at [facility].

## 2024-06-16 ENCOUNTER — Encounter (HOSPITAL_COMMUNITY)
Admission: RE | Admit: 2024-06-16 | Discharge: 2024-06-16 | Disposition: A | Discharge status: Home / Self Care | Source: Ambulatory Visit | Attending: Internal Medicine | Admitting: Internal Medicine

## 2024-06-16 DIAGNOSIS — J849 Interstitial pulmonary disease, unspecified: Secondary | ICD-10-CM

## 2024-06-16 NOTE — Progress Notes (Signed)
 Daily Session Note  Patient Details  Name: Kyle Bullock MRN: [id number] Date of Birth: January 22, 1951 Referring Provider:   Conrad Ports Pulmonary Rehab Walk Test from 05/11/2024 in [facility] for Heart, Vascular, & Lung Health  Referring Provider [name]    Encounter Date: 06/16/2024  Check In:  Session Check In - 06/16/24 1054       Check-In   Supervising physician immediately available to respond to emergencies [facility] MD immediately available    Physician(s) Rosabel Mose, NP    Location [facility]    Staff Present Johnnie Moats, MS, ACSM-CEP, Exercise Physiologist;Mary Harvy, RN, Wetzel Sharps, RT    Virtual Visit No    Medication changes reported     No    Fall or balance concerns reported    No    Tobacco Cessation No Change    Warm-up and Cool-down Performed as group-led instruction    Resistance Training Performed Yes    VAD Patient? No    PAD/SET Patient? No      Pain Assessment   Currently in Pain? No/denies    Multiple Pain Sites No          Capillary Blood Glucose: No results found for this or any previous visit (from the past 24 hours).    Social History   Tobacco Use  Smoking Status Former   Current packs/day: 0.00   Average packs/day: 2.0 packs/day for 40.0 years (80.0 ttl pk-yrs)   Types: Cigarettes   Start date: 11/10/1974   Quit date: 11/10/2014   Years since quitting: 9.6  Smokeless Tobacco Never    Goals Met:  Proper associated with RPD/PD & O2 Sat Independence with exercise equipment Exercise tolerated well No report of concerns or symptoms today Strength training completed today  Goals Unmet:  Not Applicable  Comments: Service time is from 1011 to 1145.    Dr. Slater Staff is Medical Director for Pulmonary Rehab at [facility].

## 2024-06-21 ENCOUNTER — Encounter (HOSPITAL_COMMUNITY)
Admission: RE | Admit: 2024-06-21 | Discharge: 2024-06-21 | Disposition: A | Discharge status: Home / Self Care | Source: Ambulatory Visit | Attending: Internal Medicine

## 2024-06-21 DIAGNOSIS — J849 Interstitial pulmonary disease, unspecified: Secondary | ICD-10-CM

## 2024-06-21 NOTE — Progress Notes (Signed)
 Daily Session Note  Patient Details  Name: Kyle Bullock MRN: [id number] Date of Birth: [birth year] Referring Provider:   Conrad Ports Pulmonary Rehab Walk Test from 05/11/2024 in [facility] for Heart, Vascular, & Lung Health  Referring Provider [name]    Encounter Date: 06/21/2024  Check In:  Session Check In - 06/21/24 1116       Check-In   Supervising physician immediately available to respond to emergencies [facility] MD immediately available    Physician(s) Rosabel Mose, NP    [facility]    Staff Present Johnnie Moats, MS, ACSM-CEP, Exercise Physiologist;Mary Harvy, RN, BSN;Casey Claudene, RT;Fay Swider BS, ACSM-CEP, Exercise Physiologist;Samantha Belarus, RD, LDN    Virtual Visit No    Medication changes reported     No    Fall or balance concerns reported    No    Tobacco Cessation No Change    Warm-up and Cool-down Performed as group-led instruction    Resistance Training Performed Yes    VAD Patient? No    PAD/SET Patient? No      Pain Assessment   Currently in Pain? No/denies          Capillary Blood Glucose: No results found for this or any previous visit (from the past 24 hours).    Social History   Tobacco Use  Smoking Status Former   Current packs/day: 0.00   Average packs/day: 2.0 packs/day for 40.0 years (80.0 ttl pk-yrs)   Types: Cigarettes   Start date: 11/10/1974   Quit date: 11/10/2014   Years since quitting: 9.6  Smokeless Tobacco Never    Goals Met:  Independence with exercise equipment Exercise tolerated well No report of concerns or symptoms today Strength training completed today  Goals Unmet:  Not Applicable  Comments: Service time is from 1016 to 1142.    Dr. Slater Staff is Medical Director for Pulmonary Rehab at [facility].

## 2024-06-23 ENCOUNTER — Encounter (HOSPITAL_COMMUNITY)
Admission: RE | Admit: 2024-06-23 | Discharge: 2024-06-23 | Disposition: A | Discharge status: Home / Self Care | Source: Ambulatory Visit | Attending: Internal Medicine | Admitting: Internal Medicine

## 2024-06-23 DIAGNOSIS — J849 Interstitial pulmonary disease, unspecified: Secondary | ICD-10-CM | POA: Diagnosis not present

## 2024-06-23 NOTE — Progress Notes (Signed)
 Daily Session Note  Patient Details  Name: Kyle Bullock MRN: [id number] Date of Birth: [birth year] Referring Provider:   Conrad Bullock Pulmonary Rehab Walk Test from 05/11/2024 in [facility] for Heart, Vascular, & Lung Health  Referring Provider Kyle Bullock    Encounter Date: 06/23/2024  Check In:  Session Check In - 06/23/24 1031       Check-In   Supervising physician immediately available to respond to emergencies [facility] MD immediately available    Physician(s) Kyle Louis, NP    [facility]    Staff Present Kyle Moats, MS, ACSM-CEP, Exercise Physiologist;Kyle Benninger Harvy, RN, BSN;Kyle Bullock, RT;Kyle Bullock, RD, LDN;Kyle Bernett, RN, BSN    Virtual Visit No    Medication changes reported     No    Fall or balance concerns reported    No    Tobacco Cessation No Change    Warm-up and Cool-down Performed as group-led Writer Performed Yes    VAD Patient? No    PAD/SET Patient? No      Pain Assessment   Currently in Pain? No/denies    Multiple Pain Sites No          Capillary Blood Glucose: No results found for this or any previous visit (from the past 24 hours).    Social History   Tobacco Use  Smoking Status Former   Current packs/day: 0.00   Average packs/day: 2.0 packs/day for 40.0 years (80.0 ttl pk-yrs)   Types: Cigarettes   Start date: 11/10/1974   Quit date: 11/10/2014   Years since quitting: 9.6  Smokeless Tobacco Never    Goals Met:  Independence with exercise equipment Exercise tolerated well No report of concerns or symptoms today Strength training completed today  Goals Unmet:  Not Applicable  Comments: Service time is from 1011 to 1136    Kyle Bullock Staff is Medical Director for Pulmonary Rehab at [facility].

## 2024-06-23 NOTE — Progress Notes (Addendum)
   06/23/24 1519  6 Minute Walk  Phase Mid Program  Distance 1340 feet  Walk Time 6 minutes  # of Rest Breaks 0  MPH 2.54  METS 2.9  RPE 13  Perceived Dyspnea  2  VO2 Peak 10.16  Symptoms No  Resting HR 62 bpm  Resting BP 90/52  Resting Oxygen Saturation  95 %  Exercise Oxygen Saturation  during 6 min walk 86 %  Max Ex. HR 123 bpm  Max Ex. BP 100/60  2 Minute Post BP 100/50  Interval HR  1 Minute HR 84  2 Minute HR 109  3 Minute HR 113  4 Minute HR 116  5 Minute HR 112  6 Minute HR 123  2 Minute Post HR 57  Interval Heart Rate? Yes  Interval Oxygen  Interval Oxygen? Yes  Baseline Oxygen Saturation % 95 %  1 Minute Oxygen Saturation % 90 %  1 Minute Liters of Oxygen 0 L  2 Minute Oxygen Saturation % 91 %  2 Minute Liters of Oxygen 0 L  3 Minute Oxygen Saturation % 90 %  3 Minute Liters of Oxygen 0 L  4 Minute Oxygen Saturation % 86 % (87% at 4:30)  4 Minute Liters of Oxygen 0 L (increased to 1L and then 2L)  5 Minute Oxygen Saturation % 93 %  5 Minute Liters of Oxygen 2 L  6 Minute Oxygen Saturation % 94 %  6 Minute Liters of Oxygen 2 L  2 Minute Post Oxygen Saturation % 96 %  2 Minute Post Liters of Oxygen 2 L   Patient needs 2 LPM with exertion.

## 2024-06-27 ENCOUNTER — Telehealth: Payer: Self-pay

## 2024-06-27 DIAGNOSIS — J9611 Chronic respiratory failure with hypoxia: Secondary | ICD-10-CM

## 2024-06-27 NOTE — Telephone Encounter (Signed)
 Ok for o2

## 2024-06-27 NOTE — Telephone Encounter (Signed)
 Copied from CRM [id number]. Topic: Clinical - Order For Equipment >> Jun 23, 2024  3:28 PM Isabell A wrote: Reason for CRM: Kaylie from [facility] pulmonary Rehab calling to request an order of oxygen for the patient - he is currently using oxygen in rehab.   Callback number: [phone]

## 2024-06-28 ENCOUNTER — Encounter (HOSPITAL_COMMUNITY)
Admission: RE | Admit: 2024-06-28 | Discharge: 2024-06-28 | Disposition: A | Discharge status: Home / Self Care | Source: Ambulatory Visit | Attending: Internal Medicine | Admitting: Internal Medicine

## 2024-06-28 VITALS — Wt 211.6 lb

## 2024-06-28 DIAGNOSIS — J849 Interstitial pulmonary disease, unspecified: Secondary | ICD-10-CM

## 2024-06-28 NOTE — Progress Notes (Signed)
 Daily Session Note  Patient Details  Name: Kyle Bullock MRN: [id number] Date of Birth: Oct 02, 1951 Referring Provider:   Conrad Ports Pulmonary Rehab Walk Test from 05/11/2024 in [facility] for Heart, Vascular, & Lung Health  Referring Provider [name]    Encounter Date: 06/28/2024  Check In:  Session Check In - 06/28/24 1104       Check-In   Supervising physician immediately available to respond to emergencies [facility] MD immediately available    Physician(s) Rosaline Skains, NP    [facility]    Staff Present Johnnie Moats, MS, ACSM-CEP, Exercise Physiologist;Mary Harvy, RN, BSN;Casey Claudene Meline Belarus, RD, LDN    Virtual Visit No    Medication changes reported     No    Fall or balance concerns reported    No    Tobacco Cessation No Change    Warm-up and Cool-down Performed as group-led instruction    Resistance Training Performed Yes    VAD Patient? No    PAD/SET Patient? No      Pain Assessment   Currently in Pain? No/denies    Multiple Pain Sites No          Capillary Blood Glucose: No results found for this or any previous visit (from the past 24 hours).   Exercise Prescription Changes - 06/28/24 1100       Response to Exercise   Blood Pressure (Admit) 90/54    Blood Pressure (Exercise) 112/60    Blood Pressure (Exit) 96/54    Heart Rate (Admit) 64 bpm    Heart Rate (Exercise) 102 bpm    Heart Rate (Exit) 56 bpm    Oxygen Saturation (Admit) 95 %    Oxygen Saturation (Exercise) 89 %    Oxygen Saturation (Exit) 96 %    Rating of Perceived Exertion (Exercise) 13    Perceived Dyspnea (Exercise) 2    Duration Continue with 30 min of aerobic exercise without signs/symptoms of physical distress.    Intensity THRR unchanged      Progression   Progression Continue to progress workloads to maintain intensity without signs/symptoms of physical distress.      Resistance Training   Training Prescription  Yes    Weight blue bands    Reps 10-15    Time 10 Minutes      Oxygen   Oxygen Continuous    Liters 2      Treadmill   MPH 3    Grade 1.5    Minutes 15    METs 3.6      Bike   Level 3    Minutes 15    METs 2.9      Oxygen   Maintain Oxygen Saturation 88% or higher          Social History   Tobacco Use  Smoking Status Former   Current packs/day: 0.00   Average packs/day: 2.0 packs/day for 40.0 years (80.0 ttl pk-yrs)   Types: Cigarettes   Start date: 11/10/1974   Quit date: 11/10/2014   Years since quitting: 9.6  Smokeless Tobacco Never    Goals Met:  Proper associated with RPD/PD & O2 Sat Independence with exercise equipment Exercise tolerated well No report of concerns or symptoms today Strength training completed today  Goals Unmet:  Not Applicable  Comments: Service time is from 1003 to 1140.    Dr. Slater Staff is Medical Director for Pulmonary Rehab at [facility].

## 2024-06-28 NOTE — Telephone Encounter (Signed)
 MR- will need directions for o2 use please.

## 2024-06-29 ENCOUNTER — Other Ambulatory Visit: Payer: Self-pay

## 2024-06-30 ENCOUNTER — Encounter (HOSPITAL_COMMUNITY)
Admission: RE | Admit: 2024-06-30 | Discharge: 2024-06-30 | Disposition: A | Discharge status: Home / Self Care | Source: Ambulatory Visit | Attending: Internal Medicine

## 2024-06-30 VITALS — Wt 211.2 lb

## 2024-06-30 DIAGNOSIS — J849 Interstitial pulmonary disease, unspecified: Secondary | ICD-10-CM | POA: Diagnosis not present

## 2024-06-30 NOTE — Progress Notes (Signed)
 Daily Session Note  Patient Details  Name: Kyle Bullock MRN: [id number] Date of Birth: [birth year] Referring Provider:   Conrad [facility]s [facility] Walk Test from 05/11/2024 in [facility]  Referring Provider [name]    Encounter Date: 06/30/2024  Check In:  Session Check In - 06/30/24 1020       Check-In   Supervising physician immediately available to respond to emergencies [facility] MD immediately available    Physician(s) Lum Louis, NP    Location [facility]    Staff Present Johnnie Moats, MS, ACSM-CEP, Exercise Physiologist;Zohra Clavel Harvy, RN, Wetzel Sharps, RT    Virtual Visit No    Medication changes reported     No    Fall or balance concerns reported    No    Tobacco Cessation No Change    Warm-up and Cool-down Performed as group-led instruction    Resistance Training Performed Yes    VAD Patient? No    PAD/SET Patient? No      Pain Assessment   Currently in Pain? No/denies    Multiple Pain Sites No          Capillary Blood Glucose: No results found for this or any previous visit (from the past 24 hours).    Social History   Tobacco Use  Smoking Status Former   Current packs/day: 0.00   Average packs/day: 2.0 packs/day for 40.0 years (80.0 ttl pk-yrs)   Types: Cigarettes   Start date: 11/10/1974   Quit date: 11/10/2014   Years since quitting: 9.6  Smokeless Tobacco Never    Goals Met:  Independence with exercise equipment Exercise tolerated well No report of concerns or symptoms today Strength training completed today  Goals Unmet:  Not Applicable  Comments: Service time is from 1009 to 1134    Dr. Slater Staff is Medical Director for Pulmonary Rehab at [facility].

## 2024-07-01 ENCOUNTER — Other Ambulatory Visit: Payer: Self-pay

## 2024-07-01 NOTE — Telephone Encounter (Signed)
 Looks like they were able to do with 2L O2 but they can use upto 5L as needed to keep pulse ox > 88% to complete 

## 2024-07-03 ENCOUNTER — Encounter (INDEPENDENT_AMBULATORY_CARE_PROVIDER_SITE_OTHER): Payer: Self-pay

## 2024-07-04 ENCOUNTER — Other Ambulatory Visit: Payer: Self-pay

## 2024-07-04 NOTE — Progress Notes (Signed)
 Specialty Pharmacy Refill Coordination Note  Kyle Bullock is a 73 y.o. male contacted today regarding refills of specialty medication(s) Pirfenidone    Patient requested Delivery   Delivery date: 07/06/24   Verified address: [address], [place], [place]. [address]   Medication will be filled on 07/05/24.

## 2024-07-05 ENCOUNTER — Encounter (HOSPITAL_COMMUNITY)

## 2024-07-06 NOTE — Telephone Encounter (Signed)
 I have placed the order for o2  Called pulmonary rehab and had to [facility] Will route to myself for f/u

## 2024-07-07 ENCOUNTER — Encounter (HOSPITAL_COMMUNITY)

## 2024-07-08 ENCOUNTER — Telehealth: Payer: Self-pay | Admitting: Internal Medicine

## 2024-07-08 NOTE — Telephone Encounter (Signed)
 I signed it ---

## 2024-07-08 NOTE — Telephone Encounter (Signed)
 I called pulm rehab and spoke with Kaylie and notified her order for o2 was placed. Nothing further needed.

## 2024-07-08 NOTE — Telephone Encounter (Signed)
 Per Glade at [facility]- I pulled the order but the testing does not show how many liters of oxygen was needed for the patient to recover after he desatted and the script can not be 2-5 LPM,  the liter flow needs to be based on the testing results. Please advise

## 2024-07-08 NOTE — Telephone Encounter (Signed)
 I placed new order with 2lpm instead of 2-5

## 2024-07-09 DIAGNOSIS — G4733 Obstructive sleep apnea (adult) (pediatric): Secondary | ICD-10-CM | POA: Diagnosis not present

## 2024-07-12 ENCOUNTER — Encounter (HOSPITAL_COMMUNITY)

## 2024-07-13 NOTE — Progress Notes (Unsigned)
  Electrophysiology Office Follow up Visit Note:    Date:  07/14/2024   ID:  Kyle Bullock, DOB 30-Jan-1951, MRN [id number]  PCP:  Loreli Elsie JONETTA Mickey., MD  [facility] Cardiologist:  None  [facility] Electrophysiologist:  OLE ONEIDA HOLTS, MD    Interval History:     Kyle Bullock is a 73 y.o. male who presents for a follow up visit.   I last saw the patient May 18, 2023.  He has persistent atrial fibrillation.  He also has a history of ventricular fibrillation with an ICD in place. We stopped amiodarone  in the past.  He takes Eliquis  for stroke prophylaxis.       Past medical, surgical, social and family history were reviewed.  ROS:   Please see the history of present illness.    All other systems reviewed and are negative.  EKGs/Labs/Other Studies Reviewed:    The following studies were reviewed today:  July 14, 2024 in clinic device interrogation personally reviewed Battery longevity 2.1 years Lead parameter stable No changes in programming today No high-voltage therapies        Physical Exam:    VS:  BP 104/60 (BP Location: Left Arm, Patient Position: Sitting, Cuff Size: Large)   Pulse 75   Ht 6' 3 (1.905 m)   Wt 210 lb (95.3 kg)   SpO2 95%   BMI 26.25 kg/m     Wt Readings from Last 3 Encounters:  07/14/24 210 lb (95.3 kg)  06/30/24 211 lb 3.2 oz (95.8 kg)  06/28/24 211 lb 10.3 oz (96 kg)     GEN: no distress CARD: RRR, No MRG.  ICD pocket well-healed RESP: No IWOB. CTAB.      ASSESSMENT:    1. Chronic systolic CHF (congestive heart failure) (HCC)   2. VT (ventricular tachycardia) (HCC)   3. ICD (implantable cardioverter-defibrillator) in place    PLAN:    In order of problems listed above:  #Ventricular fibrillation #ICD in situ Doing well without recurrence  #Chronic systolic heart failure NYHA class II.  Continue spironolactone , Entresto , metoprolol , Lasix   #Persistent atrial fibrillation Low burden Continue  Eliquis   Follow-up 6 months APP   Signed, OLE HOLTS, MD, [facility], [facility] 07/14/2024 9:53 AM    Electrophysiology [facility]

## 2024-07-13 NOTE — Telephone Encounter (Signed)
 Per Glade at [facility], I have pulled the updated order that made the change to the liter flow, but the testing still does not show how many liters of oxygen was needed for the patient to recover after he desatted.  The testing must note this in order for the insurance to cover the oxygen.   Please advise thank you

## 2024-07-13 NOTE — Progress Notes (Signed)
 Pulmonary Individual Treatment Plan  Patient Details  Name: Kyle Bullock MRN: [id number] Date of Birth: 1951-08-29 Referring Provider:   Conrad Ports Pulmonary Rehab Walk Test from 05/11/2024 in [facility]  Referring Provider [name]    Initial Encounter Date:  Flowsheet Row Pulmonary Rehab Walk Test from 05/11/2024 in [facility]  Date 05/11/24    Visit Diagnosis: ILD (interstitial lung disease) (HCC)  Patient's Home Medications on Admission:   Current Outpatient Medications:    acetaminophen  (TYLENOL ) 500 MG tablet, Take 500 mg by mouth every 8 (eight) hours as needed for moderate pain., Disp: , Rfl:    atorvastatin  (LIPITOR) 20 MG tablet, TAKE 1 TABLET BY MOUTH DAILY, Disp: 90 tablet, Rfl: 3   cyanocobalamin  (VITAMIN B12) 1000 MCG tablet, Take 1,000 mcg by mouth daily., Disp: , Rfl:    ELIQUIS  5 MG TABS tablet, TAKE 1 TABLET BY MOUTH 2 TIMES A DAY, Disp: 60 tablet, Rfl: 5   furosemide  (LASIX ) 20 MG tablet, TAKE 1 TABLET BY MOUTH DAILY AS NEEDED, Disp: 30 tablet, Rfl: 2   levothyroxine (SYNTHROID) 112 MCG tablet, Take 112 mcg by mouth daily before breakfast., Disp: , Rfl:    metoprolol  succinate (TOPROL -XL) 25 MG 24 hr tablet, Take 1 tablet (25 mg total) by mouth at bedtime., Disp: 90 tablet, Rfl: 3   nitroGLYCERIN  (NITROSTAT ) 0.4 MG SL tablet, PLACE 1 TABLET UNDER THE TONGUE EVERY 5 MINUTES AS NEEDED FOR CHEST PAIN, Disp: 25 tablet, Rfl: 3   omega-3 acid ethyl esters (LOVAZA) 1 g capsule, Take 2 g by mouth 2 (two) times daily., Disp: , Rfl:    Pirfenidone  267 MG TABS, Take 3 tablets (801 mg total) by mouth 3 (three) times daily with meals., Disp: 270 tablet, Rfl: 4   Probiotic Product (PROBIOTIC PO), Take 1 tablet by mouth every other day., Disp: , Rfl:    sacubitril -valsartan  (ENTRESTO ) 24-26 MG, Take 1 tablet by mouth 2 (two) times daily., Disp: 180 tablet, Rfl:  3   spironolactone  (ALDACTONE ) 25 MG tablet, Take 1 tablet (25 mg total) by mouth daily. (Patient taking differently: Take 12.5 mg by mouth daily.), Disp: 30 tablet, Rfl: 11   TRELEGY ELLIPTA 100-62.5-25 MCG/ACT AEPB, Take 1 puff by mouth daily., Disp: , Rfl:   Past Medical History: Past Medical History:  Diagnosis Date   Abnormal TSH    a. 11/2014: felt d/t sick euthyroid.   AICD (automatic cardioverter/defibrillator) present 02/28/2015   CAD (coronary artery disease)    a. 11/2014: arrest/STEMI s/p Xience DES to the LAD and PTCA to the D1 [place], [place]). b. Relook cath 11/2014: no culprit, patent stent.   CHF (congestive heart failure) (HCC)    Dressler's syndrome (HCC)    a. 11/2014: tx with colchicine  and prednisone .   Dyslipidemia    Dyspnea    a. 11/2014: possibly due to combo of CHF and Brilinta . Brilinta  changed to Effient .   Erectile dysfunction    a. Pt aware not to take ED med within 24 hr of NTG and vice versa.   Hypertension    Ischemic cardiomyopathy    a. 11/2014: EF reportedly 20% by cath and echo in [place].   NSVT (nonsustained ventricular tachycardia) (HCC) 11/16/14   a. 11/2014: admitted after discharge from STEMI admission, 18 beats 135-140bpm in ED.   Pericardial effusion    a. 11/2014.   Pericarditis dx'd 11/2014   Pneumonia  1990's X 2   RBBB    Sleep apnea    suspected but not diagnosed (02/28/2015)   STEMI (ST elevation myocardial infarction) (HCC) 11/10/2014   Tobacco abuse    VF (ventricular fibrillation) (HCC) 11/10/14   a. 11/2014: arrest with STEMI.    Tobacco Use: Social History   Tobacco Use  Smoking Status Former   Current packs/day: 0.00   Average packs/day: 2.0 packs/day for 40.0 years (80.0 ttl pk-yrs)   Types: Cigarettes   Start date: 11/10/1974   Quit date: 11/10/2014   Years since quitting: 9.6  Smokeless Tobacco Never    Labs: Review Flowsheet       Latest Ref Rng & Units 12/04/2014 01/08/2015 07/22/2021 11/07/2021  Labs for ITP  Cardiac and Pulmonary Rehab  Cholestrol 0 - 200 mg/dL 898  891  - 872   LDL (calc) 0 - 99 mg/dL 39  38  - 42   HDL-C >59 mg/dL 43  60.99  - 51   Trlycerides <150 mg/dL 94  844.9  - 829   PH, Arterial 7.350 - 7.450 - - 7.385  -  PCO2 arterial 32.0 - 48.0 mmHg - - 39.6  -  Bicarbonate 20.0 - 28.0 mmol/L - - 19.3  24.4  23.7  -  TCO2 22 - 32 mmol/L - - 20  26  25   -  Acid-base deficit 0.0 - 2.0 mmol/L - - 5.0  1.0  1.0  -  O2 Saturation % - - 74.0  74.0  99.0  -    Details       Multiple values from one day are sorted in reverse-chronological order         Capillary Blood Glucose: Lab Results  Component Value Date   GLUCAP 88 09/02/2020     Pulmonary Assessment Scores:  Pulmonary Assessment Scores     Row Name 05/11/24 1102         ADL UCSD   ADL Phase Entry     SOB Score total 30       CAT Score   CAT Score 13       mMRC Score   mMRC Score 1       UCSD: Self-administered rating of dyspnea associated with activities of daily living (ADLs) 6-point scale (0 = not at all to 5 = maximal or unable to do because of breathlessness)  Scoring Scores range from 0 to 120.  Minimally important difference is 5 units  CAT: CAT can identify the health impairment of COPD patients and is better correlated with disease progression.  CAT has a scoring range of zero to 40. The CAT score is classified into four groups of low (less than 10), medium (10 - 20), high (21-30) and very high (31-40) based on the impact level of disease on health status. A CAT score over 10 suggests significant symptoms.  A worsening CAT score could be explained by an exacerbation, poor medication adherence, poor inhaler technique, or progression of COPD or comorbid conditions.  CAT MCID is 2 points  mMRC: mMRC (Modified Medical Research Council) Dyspnea Scale is used to assess the degree of baseline functional disability in patients of respiratory disease due to dyspnea. No minimal important difference  is established. A decrease in score of 1 point or greater is considered a positive change.   Pulmonary Function Assessment:  Pulmonary Function Assessment - 05/11/24 1154       Breath   Shortness of Breath Yes;Limiting activity  Exercise Target Goals: Exercise Program Goal: Individual exercise prescription set using results from initial 6 min walk test and THRR while considering  patient's activity barriers and safety.   Exercise Prescription Goal: Initial exercise prescription builds to 30-45 minutes a day of aerobic activity, 2-3 days per week.  Home exercise guidelines will be given to patient during program as part of exercise prescription that the participant will acknowledge.  Activity Barriers & Risk Stratification:  Activity Barriers & Cardiac Risk Stratification - 05/11/24 1102       Activity Barriers & Cardiac Risk Stratification   Activity Barriers Shortness of Breath;Muscular Weakness;Deconditioning    Comments Rt hip arthritis          6 Minute Walk:  6 Minute Walk     Row Name 05/11/24 1150 06/23/24 1519       6 Minute Walk   Phase Initial Mid Program    Distance 1050 feet 1340 feet    Walk Time 6 minutes 6 minutes    # of Rest Breaks 0 0    MPH 1.99 2.54    METS 2.22 2.9    RPE 9 13    Perceived Dyspnea  1 2    VO2 Peak 7.75 10.16    Symptoms No No    Resting HR 60 bpm 62 bpm    Resting BP 90/60 90/52    Resting Oxygen Saturation  94 % 95 %    Exercise Oxygen Saturation  during 6 min walk 90 % 86 %    Max Ex. HR 99 bpm 123 bpm    Max Ex. BP 102/56 100/60    2 Minute Post BP 104/56 100/50      Interval HR   1 Minute HR 85 84    2 Minute HR 98 109    3 Minute HR 99 113    4 Minute HR 91 116    5 Minute HR 88 112    6 Minute HR 99 123    2 Minute Post HR 57 57    Interval Heart Rate? Yes Yes      Interval Oxygen   Interval Oxygen? Yes Yes    Baseline Oxygen Saturation % 94 % 95 %    1 Minute Oxygen Saturation % 94 % 90 %    1  Minute Liters of Oxygen 0 L 0 L    2 Minute Oxygen Saturation % 90 % 91 %    2 Minute Liters of Oxygen 0 L 0 L    3 Minute Oxygen Saturation % 92 % 90 %    3 Minute Liters of Oxygen 0 L 0 L    4 Minute Oxygen Saturation % 90 % 86 %  87% at 4:30    4 Minute Liters of Oxygen 0 L 0 L  increased to 1L and then 2L    5 Minute Oxygen Saturation % 90 % 93 %    5 Minute Liters of Oxygen 0 L 2 L    6 Minute Oxygen Saturation % 91 % 94 %    6 Minute Liters of Oxygen 0 L 2 L    2 Minute Post Oxygen Saturation % 95 % 96 %    2 Minute Post Liters of Oxygen 0 L 2 L       Oxygen Initial Assessment:  Oxygen Initial Assessment - 05/13/24 0759       Home Oxygen   Home Oxygen Device None    Sleep Oxygen  Prescription CPAP    Home Exercise Oxygen Prescription None    Home Resting Oxygen Prescription None    Compliance with Home Oxygen Use No      Initial 6 min Walk   Oxygen Used None      Program Oxygen Prescription   Program Oxygen Prescription None      Intervention   Short Term Goals To learn and understand importance of maintaining oxygen saturations>88%;To learn and demonstrate proper use of respiratory medications;To learn and understand importance of monitoring SPO2 with pulse oximeter and demonstrate accurate use of the pulse oximeter.;To learn and demonstrate proper pursed lip breathing techniques or other breathing techniques. ;To learn and exhibit compliance with exercise, home and travel O2 prescription    Long  Term Goals Exhibits compliance with exercise, home  and travel O2 prescription;Verbalizes importance of monitoring SPO2 with pulse oximeter and return demonstration;Maintenance of O2 saturations>88%;Exhibits proper breathing techniques, such as pursed lip breathing or other method taught during program session;Compliance with respiratory medication;Demonstrates proper use of MDI's          Oxygen Re-Evaluation:  Oxygen Re-Evaluation     Row Name 05/11/24 1057 06/13/24 0857  07/06/24 1053         Program Oxygen Prescription   Program Oxygen Prescription -- None None       Home Oxygen   Home Oxygen Device -- None None     Sleep Oxygen Prescription -- CPAP CPAP     Home Exercise Oxygen Prescription -- None None     Home Resting Oxygen Prescription -- None None     Compliance with Home Oxygen Use -- No No       Goals/Expected Outcomes   Short Term Goals -- To learn and understand importance of maintaining oxygen saturations>88%;To learn and demonstrate proper use of respiratory medications;To learn and understand importance of monitoring SPO2 with pulse oximeter and demonstrate accurate use of the pulse oximeter.;To learn and demonstrate proper pursed lip breathing techniques or other breathing techniques. ;To learn and exhibit compliance with exercise, home and travel O2 prescription To learn and understand importance of maintaining oxygen saturations>88%;To learn and demonstrate proper use of respiratory medications;To learn and understand importance of monitoring SPO2 with pulse oximeter and demonstrate accurate use of the pulse oximeter.;To learn and demonstrate proper pursed lip breathing techniques or other breathing techniques. ;To learn and exhibit compliance with exercise, home and travel O2 prescription     Long  Term Goals -- Exhibits compliance with exercise, home  and travel O2 prescription;Verbalizes importance of monitoring SPO2 with pulse oximeter and return demonstration;Maintenance of O2 saturations>88%;Exhibits proper breathing techniques, such as pursed lip breathing or other method taught during program session;Compliance with respiratory medication;Demonstrates proper use of MDI's Exhibits compliance with exercise, home  and travel O2 prescription;Verbalizes importance of monitoring SPO2 with pulse oximeter and return demonstration;Maintenance of O2 saturations>88%;Exhibits proper breathing techniques, such as pursed lip breathing or other method  taught during program session;Compliance with respiratory medication;Demonstrates proper use of MDI's     Goals/Expected Outcomes Compliance and understanding of oxygen saturation monitoring and breathing techniques to decrease shortness of breath. Compliance and understanding of oxygen saturation monitoring and breathing techniques to decrease shortness of breath. Compliance and understanding of oxygen saturation monitoring and breathing techniques to decrease shortness of breath.        Oxygen Discharge (Final Oxygen Re-Evaluation):  Oxygen Re-Evaluation - 07/06/24 1053       Program Oxygen Prescription   Program Oxygen Prescription None  Home Oxygen   Home Oxygen Device None    Sleep Oxygen Prescription CPAP    Home Exercise Oxygen Prescription None    Home Resting Oxygen Prescription None    Compliance with Home Oxygen Use No      Goals/Expected Outcomes   Short Term Goals To learn and understand importance of maintaining oxygen saturations>88%;To learn and demonstrate proper use of respiratory medications;To learn and understand importance of monitoring SPO2 with pulse oximeter and demonstrate accurate use of the pulse oximeter.;To learn and demonstrate proper pursed lip breathing techniques or other breathing techniques. ;To learn and exhibit compliance with exercise, home and travel O2 prescription    Long  Term Goals Exhibits compliance with exercise, home  and travel O2 prescription;Verbalizes importance of monitoring SPO2 with pulse oximeter and return demonstration;Maintenance of O2 saturations>88%;Exhibits proper breathing techniques, such as pursed lip breathing or other method taught during program session;Compliance with respiratory medication;Demonstrates proper use of MDI's    Goals/Expected Outcomes Compliance and understanding of oxygen saturation monitoring and breathing techniques to decrease shortness of breath.          Initial Exercise Prescription:  Initial  Exercise Prescription - 05/11/24 1200       Date of Initial Exercise RX and Referring Provider   Date 05/11/24    Referring Provider [name]    Expected Discharge Date 08/09/24      Treadmill   MPH 2.5    Grade 1.5    Minutes 15      Bike   Level 1    Watts 40    Minutes 15    METs 2.5      Prescription Details   Frequency (times per week) 2    Duration Progress to 30 minutes of continuous aerobic without signs/symptoms of physical distress      Intensity   THRR 40-80% of Max Heartrate 58-117    Ratings of Perceived Exertion 11-13    Perceived Dyspnea 0-4      Progression   Progression Continue progressive overload as per policy without signs/symptoms or physical distress.      Resistance Training   Training Prescription Yes    Weight blue bands    Reps 10-15          Perform Capillary Blood Glucose checks as needed.  Exercise Prescription Changes:   Exercise Prescription Changes     Row Name 05/31/24 1100 06/14/24 1100 06/28/24 1100 06/30/24 1205       Response to Exercise   Blood Pressure (Admit) 94/44 100/54 90/54 90/52     Blood Pressure (Exercise) 100/52 100/60 112/60 --    Blood Pressure (Exit) 92/52 98/56 96/54  92/54    Heart Rate (Admit) 61 bpm 61 bpm 64 bpm 74 bpm    Heart Rate (Exercise) 101 bpm 81 bpm 102 bpm 106 bpm    Heart Rate (Exit) 66 bpm 62 bpm 56 bpm 72 bpm    Oxygen Saturation (Admit) 96 % 95 % 95 % 95 %    Oxygen Saturation (Exercise) 89 % 93 % 89 % 92 %    Oxygen Saturation (Exit) 94 % 95 % 96 % 96 %    Rating of Perceived Exertion (Exercise) 11 11 13 13     Perceived Dyspnea (Exercise) 1 2 2 2     Duration Continue with 30 min of aerobic exercise without signs/symptoms of physical distress. Continue with 30 min of aerobic exercise without signs/symptoms of physical distress. Continue with 30 min of aerobic exercise without signs/symptoms  of physical distress. Continue with 30 min of aerobic exercise without signs/symptoms of physical  distress.    Intensity THRR unchanged THRR unchanged THRR unchanged THRR unchanged      Progression   Progression Continue to progress workloads to maintain intensity without signs/symptoms of physical distress. Continue to progress workloads to maintain intensity without signs/symptoms of physical distress. Continue to progress workloads to maintain intensity without signs/symptoms of physical distress. Continue to progress workloads to maintain intensity without signs/symptoms of physical distress.      Resistance Training   Training Prescription Yes Yes Yes Yes    Weight blue bands blue bands blue bands black bands    Reps 10-15 10-15 10-15 10-15    Time 10 Minutes 10 Minutes 10 Minutes 10 Minutes      Interval Training   Interval Training No -- -- --      Oxygen   Oxygen -- -- Continuous Intermittent    Liters -- 2 2 2       Treadmill   MPH 2.5 2.5 3 3     Grade 0 1 1.5 1    Minutes 15 15 15 15     METs 2.8 3.1 3.6 3.5      Bike   Level 2 3 3 3     Minutes 15 15 15 15     METs 3 2.8 2.9 3.5      Oxygen   Maintain Oxygen Saturation -- -- 88% or higher 88% or higher       Exercise Comments:   Exercise Comments     Row Name 05/24/24 1517           Exercise Comments Pt completed his first day of exercise. He exercised for 15 min on the upright bike and treadmill. Kyle Bullock averaged 2.6 METs at level 2 on the upright bike and 2.8 METs at 2.5 mph on the treadmill. Kyle Bullock perfomed the warmup and cooldown standing without limitations. Discussed METs with good reception.          Exercise Goals and Review:   Exercise Goals     Row Name 05/11/24 1152             Exercise Goals   Increase Physical Activity Yes       Intervention Provide advice, education, support and counseling about physical activity/exercise needs.;Develop an individualized exercise prescription for aerobic and resistive training based on initial evaluation findings, risk stratification, comorbidities and  participant's personal goals.       Expected Outcomes Short Term: Attend rehab on a regular basis to increase amount of physical activity.;Long Term: Add in home exercise to make exercise part of routine and to increase amount of physical activity.;Long Term: Exercising regularly at least 3-5 days a week.       Increase Strength and Stamina Yes       Intervention Provide advice, education, support and counseling about physical activity/exercise needs.;Develop an individualized exercise prescription for aerobic and resistive training based on initial evaluation findings, risk stratification, comorbidities and participant's personal goals.       Expected Outcomes Short Term: Increase workloads from initial exercise prescription for resistance, speed, and METs.;Short Term: Perform resistance training exercises routinely during rehab and add in resistance training at home;Long Term: Improve cardiorespiratory fitness, muscular endurance and strength as measured by increased METs and functional capacity ( )       Able to understand and use rate of perceived exertion (RPE) scale Yes       Intervention Provide education and  explanation on how to use RPE scale       Expected Outcomes Short Term: Able to use RPE daily in rehab to express subjective intensity level;Long Term:  Able to use RPE to guide intensity level when exercising independently       Able to understand and use Dyspnea scale Yes       Intervention Provide education and explanation on how to use Dyspnea scale       Expected Outcomes Short Term: Able to use Dyspnea scale daily in rehab to express subjective sense of shortness of breath during exertion;Long Term: Able to use Dyspnea scale to guide intensity level when exercising independently       Knowledge and understanding of Target Heart Rate Range (THRR) Yes       Intervention Provide education and explanation of THRR including how the numbers were predicted and where they are located for  reference       Expected Outcomes Short Term: Able to state/look up THRR;Long Term: Able to use THRR to govern intensity when exercising independently;Short Term: Able to use daily as guideline for intensity in rehab       Understanding of Exercise Prescription Yes       Intervention Provide education, explanation, and written materials on patient's individual exercise prescription       Expected Outcomes Short Term: Able to explain program exercise prescription;Long Term: Able to explain home exercise prescription to exercise independently          Exercise Goals Re-Evaluation :  Exercise Goals Re-Evaluation     Row Name 05/11/24 1152 06/13/24 0854 07/06/24 1046         Exercise Goal Re-Evaluation   Exercise Goals Review Increase Physical Activity;Able to understand and use Dyspnea scale;Understanding of Exercise Prescription;Increase Strength and Stamina;Knowledge and understanding of Target Heart Rate Range (THRR);Able to understand and use rate of perceived exertion (RPE) scale Increase Physical Activity;Able to understand and use Dyspnea scale;Understanding of Exercise Prescription;Increase Strength and Stamina;Knowledge and understanding of Target Heart Rate Range (THRR);Able to understand and use rate of perceived exertion (RPE) scale Increase Physical Activity;Able to understand and use Dyspnea scale;Understanding of Exercise Prescription;Increase Strength and Stamina;Knowledge and understanding of Target Heart Rate Range (THRR);Able to understand and use rate of perceived exertion (RPE) scale     Comments Patient is scheduled to begin exercise on 6/12. Kyle Bullock has completed 6 exercise sessions. He exercises for 15 min on the treadmill and upright bike. He averages 2.8 METs at level 2 on the upright bike and 3.1 METs at 2.5 mph and 1% incline on the treadmill. He performs the warmup and cooldown standing without limitations. He has increased his incline on the treadmill. Upright bike level  remains the same. Will encourage him to increase the level on the upright bike. Will continue to monitor and progress as able. Kyle Bullock has completed 12 exercise sessions. He exercises for 15 min on the upright bike and treadmill. Kyle Bullock averages 3.5 METs at level 3 on the upright bike and 3.5 METs at 3 mph and 1% incline on the treadmill. He has increased his level on the upright bike and speed on the treadmill. Kyle Bullock seems motivated to exercise. Will continue to monitor and progress as able.     Expected Outcomes Through exercise at rehab and home, the patient will decrease shortness of breath with daily activities and feel confident in carrying out an exercise regimen at home. Through exercise at rehab and home, the patient will decrease shortness  of breath with daily activities and feel confident in carrying out an exercise regimen at home. Through exercise at rehab and home, the patient will decrease shortness of breath with daily activities and feel confident in carrying out an exercise regimen at home.        Discharge Exercise Prescription (Final Exercise Prescription Changes):  Exercise Prescription Changes - 06/30/24 1205       Response to Exercise   Blood Pressure (Admit) 90/52    Blood Pressure (Exit) 92/54    Heart Rate (Admit) 74 bpm    Heart Rate (Exercise) 106 bpm    Heart Rate (Exit) 72 bpm    Oxygen Saturation (Admit) 95 %    Oxygen Saturation (Exercise) 92 %    Oxygen Saturation (Exit) 96 %    Rating of Perceived Exertion (Exercise) 13    Perceived Dyspnea (Exercise) 2    Duration Continue with 30 min of aerobic exercise without signs/symptoms of physical distress.    Intensity THRR unchanged      Progression   Progression Continue to progress workloads to maintain intensity without signs/symptoms of physical distress.      Resistance Training   Training Prescription Yes    Weight black bands    Reps 10-15    Time 10 Minutes      Oxygen   Oxygen Intermittent    Liters 2       Treadmill   MPH 3    Grade 1    Minutes 15    METs 3.5      Bike   Level 3    Minutes 15    METs 3.5      Oxygen   Maintain Oxygen Saturation 88% or higher          Nutrition:  Target Goals: Understanding of nutrition guidelines, daily intake of sodium 1500mg , cholesterol 200mg , calories 30% from fat and 7% or less from saturated fats, daily to have 5 or more servings of fruits and vegetables.  Biometrics:    Nutrition Therapy Plan and Nutrition Goals:  Nutrition Therapy & Goals - 06/23/24 1049       Nutrition Therapy   Diet Heart Healthy Diet    Drug/Food Interactions Statins/Certain Fruits      Personal Nutrition Goals   Nutrition Goal Patient to improve diet quality by using the plate method as a guide for meal planning to include lean protein/plant protein, fruits, vegetables, whole grains, nonfat dairy as part of a well-balanced diet.    Comments Goals in progress. Kyle Bullock has medical history of chronic systolic heart failure, CAD w/ MI 2015, afib, hx of tobacco abuse, emphysema/ILD, OSA. He is taking esbriet  for IPF; he reports side effects of rash but reports stable appetite/eating patterns. He reports motivation to lose some weight; he is down 2.2# since starting with our program; BMI remains appropriate for age.Patient will benefit from participation in pulmonary rehab for nutrition, exercise, and lifestyle modification.      Intervention Plan   Intervention Nutrition handout(s) given to patient.;Prescribe, educate and counsel regarding individualized specific dietary modifications aiming towards targeted core components such as weight, hypertension, lipid management, diabetes, heart failure and other comorbidities.    Expected Outcomes Short Term Goal: Understand basic principles of dietary content, such as calories, fat, sodium, cholesterol and nutrients.;Long Term Goal: Adherence to prescribed nutrition plan.          Nutrition Assessments:  Nutrition  Assessments - 05/24/24 1204       Rate  Your Plate Scores   Pre Score 63         MEDIFICTS Score Key: >=70 Need to make dietary changes  40-70 Heart Healthy Diet <= 40 Therapeutic Level Cholesterol Diet  Flowsheet Row PULMONARY REHAB OTHER RESPIRATORY from 05/24/2024 in [facility]  Picture Your Plate Total Score on Admission 63   Picture Your Plate Scores: <59 Unhealthy dietary pattern with much room for improvement. 41-50 Dietary pattern unlikely to meet recommendations for good health and room for improvement. 51-60 More healthful dietary pattern, with some room for improvement.  >60 Healthy dietary pattern, although there may be some specific behaviors that could be improved.    Nutrition Goals Re-Evaluation:  Nutrition Goals Re-Evaluation     Row Name 05/24/24 1142 06/23/24 1049           Goals   Current Weight 210 lb 1.6 oz (95.3 kg) 209 lb 14.1 oz (95.2 kg)      Comment no recent lipid panel for review. hepatic function panel WNL, BNP 778 no recent lipid panel for review. hepatic function panel WNL, BNP 778      Expected Outcome Kyle Bullock has medical history of chronic systolic heart failure, CAD w/ MI 2015, afib, hx of tobacco abuse, emphysema/ILD, OSA. He is taking esbriet  for IPF; he reports side effects of rash but reports stable appetite/eating patterns. Patient will benefit from participation in pulmonary rehab for nutrition, exercise, and lifestyle modification. Goals in progress. Kyle Bullock has medical history of chronic systolic heart failure, CAD w/ MI 2015, afib, hx of tobacco abuse, emphysema/ILD, OSA. He is taking esbriet  for IPF; he reports side effects of rash but reports stable appetite/eating patterns. He reports motivation to lose some weight; he is down 2.2# since starting with our program; BMI remains appropriate for age.Patient will benefit from participation in pulmonary rehab for nutrition, exercise, and  lifestyle modification.         Nutrition Goals Discharge (Final Nutrition Goals Re-Evaluation):  Nutrition Goals Re-Evaluation - 06/23/24 1049       Goals   Current Weight 209 lb 14.1 oz (95.2 kg)    Comment no recent lipid panel for review. hepatic function panel WNL, BNP 778    Expected Outcome Goals in progress. Kyle Bullock has medical history of chronic systolic heart failure, CAD w/ MI 2015, afib, hx of tobacco abuse, emphysema/ILD, OSA. He is taking esbriet  for IPF; he reports side effects of rash but reports stable appetite/eating patterns. He reports motivation to lose some weight; he is down 2.2# since starting with our program; BMI remains appropriate for age.Patient will benefit from participation in pulmonary rehab for nutrition, exercise, and lifestyle modification.          Psychosocial: Target Goals: Acknowledge presence or absence of significant depression and/or stress, maximize coping skills, provide positive support system. Participant is able to verbalize types and ability to use techniques and skills needed for reducing stress and depression.  Initial Review & Psychosocial Screening:  Initial Psych Review & Screening - 05/11/24 1055       Initial Review   Current issues with None Identified      Family Dynamics   Good Support System? Yes    Comments spouse, children      Barriers   Psychosocial barriers to participate in program There are no identifiable barriers or psychosocial needs.      Screening Interventions   Interventions Encouraged to exercise  Quality of Life Scores:  Scores of 19 and below usually indicate a poorer quality of life in these areas.  A difference of  2-3 points is a clinically meaningful difference.  A difference of 2-3 points in the total score of the Quality of Life Index has been associated with significant improvement in overall quality of life, self-image, physical symptoms, and general health in studies assessing change  in quality of life.  PHQ-9: Review Flowsheet       05/11/2024 04/25/2015 12/27/2014  Depression screen PHQ 2/9  Decreased Interest 0 0 0  Down, Depressed, Hopeless 0 0 1  PHQ - 2 Score 0 0 1  Altered sleeping 1 - -  Tired, decreased energy 1 - -  Change in appetite 0 - -  Feeling bad or failure about yourself  0 - -  Trouble concentrating 0 - -  Moving slowly or fidgety/restless 0 - -  Suicidal thoughts 0 - -  PHQ-9 Score 2 - -  Difficult doing work/chores Not difficult at all - -   Interpretation of Total Score  Total Score Depression Severity:  1-4 = Minimal depression, 5-9 = Mild depression, 10-14 = Moderate depression, 15-19 = Moderately severe depression, 20-27 = Severe depression   Psychosocial Evaluation and Intervention:  Psychosocial Evaluation - 05/11/24 1055       Psychosocial Evaluation & Interventions   Comments Kyle Bullock denies any psychosocial barriers at this time.    Expected Outcomes For Kyle Bullock to participate in rehab free of psychosocial barriers.    Continue Psychosocial Services  No Follow up required          Psychosocial Re-Evaluation:  Psychosocial Re-Evaluation     Row Name 05/13/24 1328 06/02/24 0915 07/06/24 1255         Psychosocial Re-Evaluation   Current issues with None Identified None Identified None Identified     Comments Kyle Bullock is scheduled to start PR on 05/19/24. No new psychosocial barriers or concerns at this time. Kyle Bullock continues to deny any new psychosocial barriers or concerns Kyle Bullock continues to deny any new psychosocial barriers or concerns     Expected Outcomes For Kyle Bullock to participate in PR free of any psychosocial barriers or concerns For Kyle Bullock to participate in PR free of any psychosocial barriers or concerns For Kyle Bullock to participate in PR free of any psychosocial barriers or concerns     Interventions Encouraged to attend Pulmonary Rehabilitation for the exercise Encouraged to attend Pulmonary Rehabilitation for the exercise Encouraged to attend  Pulmonary Rehabilitation for the exercise     Continue Psychosocial Services  No Follow up required No Follow up required No Follow up required        Psychosocial Discharge (Final Psychosocial Re-Evaluation):  Psychosocial Re-Evaluation - 07/06/24 1255       Psychosocial Re-Evaluation   Current issues with None Identified    Comments Kyle Bullock continues to deny any new psychosocial barriers or concerns    Expected Outcomes For Kyle Bullock to participate in PR free of any psychosocial barriers or concerns    Interventions Encouraged to attend Pulmonary Rehabilitation for the exercise    Continue Psychosocial Services  No Follow up required          Education: Education Goals: Education classes will be provided on a weekly basis, covering required topics. Participant will state understanding/return demonstration of topics presented.  Learning Barriers/Preferences:  Learning Barriers/Preferences - 05/11/24 1056       Learning Barriers/Preferences   Learning Barriers Sight  Learning Preferences None          Education Topics: Know Your Numbers Group instruction that is supported by a PowerPoint presentation. Instructor discusses importance of knowing and understanding resting, exercise, and post-exercise oxygen saturation, heart rate, and blood pressure. Oxygen saturation, heart rate, blood pressure, rating of perceived exertion, and dyspnea are reviewed along with a normal range for these values.  Flowsheet Row PULMONARY REHAB OTHER RESPIRATORY from 06/02/2024 in [facility]  Date 06/02/24  Educator EP  Instruction Review Code 1- Verbalizes Understanding    Exercise for the Pulmonary Patient Group instruction that is supported by a PowerPoint presentation. Instructor discusses benefits of exercise, core components of exercise, frequency, duration, and intensity of an exercise routine, importance of utilizing pulse oximetry  during exercise, safety while exercising, and options of places to exercise outside of rehab.  Flowsheet Row PULMONARY REHAB OTHER RESPIRATORY from 05/26/2024 in [facility]  Date 05/26/24  Educator EP  Instruction Review Code 1- Verbalizes Understanding    MET Level  Group instruction provided by PowerPoint, verbal discussion, and written material to support subject matter. Instructor reviews what METs are and how to increase METs.    Pulmonary Medications Verbally interactive group education provided by instructor with focus on inhaled medications and proper administration.   Anatomy and Physiology of the Respiratory System Group instruction provided by PowerPoint, verbal discussion, and written material to support subject matter. Instructor reviews respiratory cycle and anatomical components of the respiratory system and their functions. Instructor also reviews differences in obstructive and restrictive respiratory diseases with examples of each.    Oxygen Safety Group instruction provided by PowerPoint, verbal discussion, and written material to support subject matter. There is an overview of "What is Oxygen" and "Why do we need it".  Instructor also reviews how to create a safe environment for oxygen use, the importance of using oxygen as prescribed, and the risks of noncompliance. There is a brief discussion on traveling with oxygen and resources the patient may utilize. Flowsheet Row PULMONARY REHAB OTHER RESPIRATORY from 06/09/2024 in [facility], [facility]  Date 06/09/24  Educator RN  Instruction Review Code 1- Verbalizes Understanding    Oxygen Use Group instruction provided by PowerPoint, verbal discussion, and written material to discuss how supplemental oxygen is prescribed and different types of oxygen supply systems. Resources for more information are provided.     Breathing Techniques Group instruction that is supported by demonstration and informational handouts. Instructor discusses the benefits of pursed lip and diaphragmatic breathing and detailed demonstration on how to perform both.  Flowsheet Row PULMONARY REHAB OTHER RESPIRATORY from 06/23/2024 in [facility], [facility]  Date 06/23/24  Educator RN  Instruction Review Code 1- Verbalizes Understanding     Risk Factor Reduction Group instruction that is supported by a PowerPoint presentation. Instructor discusses the definition of a risk factor, different risk factors for pulmonary disease, and how the heart and lungs work together.   Pulmonary Diseases Group instruction provided by PowerPoint, verbal discussion, and written material to support subject matter. Instructor gives an overview of the different type of pulmonary diseases. There is also a discussion on risk factors and symptoms as well as ways to manage the diseases.   Stress and Energy Conservation Group instruction provided by PowerPoint, verbal discussion, and written material to  support subject matter. Instructor gives an overview of stress and the impact it can have on the body. Instructor also reviews ways to reduce stress. There is also a discussion on energy conservation and ways to conserve energy throughout the day. Flowsheet Row PULMONARY REHAB OTHER RESPIRATORY from 06/30/2024 in [facility], [facility]  Date 06/30/24  Educator RN  Instruction Review Code 1- Verbalizes Understanding    Warning Signs and Symptoms Group instruction provided by PowerPoint, verbal discussion, and written material to support subject matter. Instructor reviews warning signs and symptoms of stroke, heart attack, cold and flu. Instructor also reviews ways to prevent the spread of infection.   Other Education Group or individual verbal, written, or  video instructions that support the educational goals of the pulmonary rehab program.    Knowledge Questionnaire Score:  Knowledge Questionnaire Score - 05/11/24 1158       Knowledge Questionnaire Score   Pre Score 17/18          Core Components/Risk Factors/Patient Goals at Admission:  Personal Goals and Risk Factors at Admission - 05/11/24 1056       Core Components/Risk Factors/Patient Goals on Admission    Weight Management Weight Loss;Yes    Intervention Weight Management: Develop a combined nutrition and exercise program designed to reach desired caloric intake, while maintaining appropriate intake of nutrient and fiber, sodium and fats, and appropriate energy expenditure required for the weight goal.;Weight Management: Provide education and appropriate resources to help participant work on and attain dietary goals.;Weight Management/Obesity: Establish reasonable short term and long term weight goals.;Obesity: Provide education and appropriate resources to help participant work on and attain dietary goals.    Expected Outcomes Short Term: Continue to assess and modify interventions until short term weight is achieved;Long Term: Adherence to nutrition and physical activity/exercise program aimed toward attainment of established weight goal;Weight Maintenance: Understanding of the daily nutrition guidelines, which includes 25-35% calories from fat, 7% or less cal from saturated fats, less than 200mg  cholesterol, less than 1.5gm of sodium, & 5 or more servings of fruits and vegetables daily;Weight Loss: Understanding of general recommendations for a balanced deficit meal plan, which promotes 1-2 lb weight loss per week and includes a negative energy balance of [phone] kcal/d;Understanding recommendations for meals to include 15-35% energy as protein, 25-35% energy from fat, 35-60% energy from carbohydrates, less than 200mg  of dietary cholesterol, 20-35 gm of total fiber daily;Understanding  of distribution of calorie intake throughout the day with the consumption of 4-5 meals/snacks    Improve shortness of breath with ADL's Yes    Intervention Provide education, individualized exercise plan and daily activity instruction to help decrease symptoms of SOB with activities of daily living.    Expected Outcomes Short Term: Improve cardiorespiratory fitness to achieve a reduction of symptoms when performing ADLs;Long Term: Be able to perform more ADLs without symptoms or delay the onset of symptoms          Core Components/Risk Factors/Patient Goals Review:   Goals and Risk Factor Review     Row Name 05/13/24 1329 06/02/24 0915 07/06/24 1256         Core Components/Risk Factors/Patient Goals Review   Personal Goals Review Weight Management/Obesity;Improve shortness of breath with ADL's;Develop more efficient breathing techniques such as purse lipped breathing and diaphragmatic breathing and practicing self-pacing with activity. Weight Management/Obesity;Improve shortness of breath with ADL's;Develop more efficient breathing techniques such as purse lipped breathing and diaphragmatic breathing and practicing  self-pacing with activity. Weight Management/Obesity;Improve shortness of breath with ADL's;Develop more efficient breathing techniques such as purse lipped breathing and diaphragmatic breathing and practicing self-pacing with activity.     Review Kyle Bullock is scheduled to start PR on 05/19/24. Unable to assess his goals at this time. Monthly review of patient's Core Components/Risk Factors/Patient Goals are as follows: Goal progressing for improving shortness of breath with ADL's. Kyle Bullock is currently exercising on RA to maintain sats >88%. He is exercising on the treadmill and the bike. Goal progressing for developing more efficient breathing techniques such as purse lipped breathing and diaphragmatic breathing; and practicing self-pacing with activity. Goal progressing for weight loss. He is  working with the staff dietitician on ways to lose weight. We will continue to monitor his progress throughout the program. Monthly review of patient's Core Components/Risk Factors/Patient Goals are as follows: Goal progressing for improving shortness of breath with ADL's. Kyle Bullock had to start using 2L while exercising on the treadmill to maintain sats >88%. He is able to maintain sats>88% on RA while exercising on the bike. Goal met for developing more efficient breathing techniques such as purse lip breathing and diaphragmatic breathing; and practicing self-pacing with activity. Kyle Bullock has attended the breathing technique class and is able to demonstrate PLB when he gets SOB. He also knows how to self pace based on the RPE scale. Goal progressing for weight loss. We will continue to monitor his progress throughout the program.     Expected Outcomes To improve shortness of breath with ADL's, develop more efficient breathing techniques such as purse lipped breathing and diaphragmatic breathing; and practicing self-pacing with activity and lose weight To improve shortness of breath with ADL's, develop more efficient breathing techniques such as purse lipped breathing and diaphragmatic breathing; and practicing self-pacing with activity and lose weight To improve shortness of breath with ADL's and lose weight        Core Components/Risk Factors/Patient Goals at Discharge (Final Review):   Goals and Risk Factor Review - 07/06/24 1256       Core Components/Risk Factors/Patient Goals Review   Personal Goals Review Weight Management/Obesity;Improve shortness of breath with ADL's;Develop more efficient breathing techniques such as purse lipped breathing and diaphragmatic breathing and practicing self-pacing with activity.    Review Monthly review of patient's Core Components/Risk Factors/Patient Goals are as follows: Goal progressing for improving shortness of breath with ADL's. Kyle Bullock had to start using 2L while  exercising on the treadmill to maintain sats >88%. He is able to maintain sats>88% on RA while exercising on the bike. Goal met for developing more efficient breathing techniques such as purse lip breathing and diaphragmatic breathing; and practicing self-pacing with activity. Kyle Bullock has attended the breathing technique class and is able to demonstrate PLB when he gets SOB. He also knows how to self pace based on the RPE scale. Goal progressing for weight loss. We will continue to monitor his progress throughout the program.    Expected Outcomes To improve shortness of breath with ADL's and lose weight          ITP Comments:Pt is making expected progress toward Pulmonary Rehab goals after completing 12 session(s). Recommend continued exercise, life style modification, education, and utilization of breathing techniques to increase stamina and strength, while also decreasing shortness of breath with exertion.  Dr. Slater Staff is Medical Director for Pulmonary Rehab at [facility].

## 2024-07-14 ENCOUNTER — Ambulatory Visit: Attending: Cardiology | Admitting: Cardiology

## 2024-07-14 ENCOUNTER — Telehealth (HOSPITAL_COMMUNITY): Payer: Self-pay

## 2024-07-14 ENCOUNTER — Encounter: Payer: Self-pay | Admitting: Cardiology

## 2024-07-14 ENCOUNTER — Encounter (HOSPITAL_COMMUNITY)
Admission: RE | Admit: 2024-07-14 | Discharge: 2024-07-14 | Disposition: A | Discharge status: Home / Self Care | Source: Ambulatory Visit | Attending: Internal Medicine | Admitting: Internal Medicine

## 2024-07-14 VITALS — BP 104/60 | HR 75 | Ht 75.0 in | Wt 210.0 lb

## 2024-07-14 DIAGNOSIS — Z95810 Presence of automatic (implantable) cardiac defibrillator: Secondary | ICD-10-CM

## 2024-07-14 DIAGNOSIS — I5022 Chronic systolic (congestive) heart failure: Secondary | ICD-10-CM

## 2024-07-14 DIAGNOSIS — I472 Ventricular tachycardia, unspecified: Secondary | ICD-10-CM

## 2024-07-14 DIAGNOSIS — J849 Interstitial pulmonary disease, unspecified: Secondary | ICD-10-CM | POA: Insufficient documentation

## 2024-07-14 NOTE — Patient Instructions (Signed)
 Medication Instructions:  Your physician recommends that you continue on your current medications as directed. Please refer to the Current Medication list given to you today.  *If you need a refill on your cardiac medications before your next appointment, please call your pharmacy*  Follow-Up: At [facility], you and your health needs are our priority.  As part of our continuing mission to provide you with exceptional heart care, our providers are all part of one team.  This team includes your primary Cardiologist (physician) and Advanced Practice Providers or APPs (Physician Assistants and Nurse Practitioners) who all work together to provide you with the care you need, when you need it.  Your next appointment:   6 months  Provider:   You will see one of the following Advanced Practice Providers on your designated Care Team:   Mertha Abrahams, Kennard Pea [address] G[place], PA-C Suzann Riddle, NP Creighton Doffing, NP

## 2024-07-14 NOTE — Telephone Encounter (Signed)
 Dr. Geronimo, Kyle Bullock needs 2 lpm. Please let me know if you have any other questions.

## 2024-07-14 NOTE — Telephone Encounter (Signed)
 Patient left message at 10:50am calling out for 10:15am PR class, states he forgot about the class but will be in Tuesday.

## 2024-07-14 NOTE — Telephone Encounter (Signed)
 Pt was qualified through PR for the o2  I will route to them to see if they have documentation of pt needing 5lpm o2

## 2024-07-15 NOTE — Progress Notes (Signed)
   07/15/24 1308  6 Minute Walk  Phase Mid Program  Distance 1340 feet  Walk Time 6 minutes  RPE 13  Perceived Dyspnea  2  Symptoms No  Resting HR 62 bpm  Resting BP 90/52  Resting Oxygen Saturation  95 %  Exercise Oxygen Saturation  during 6 min walk 86 %  Max Ex. HR 123 bpm  Max Ex. BP 100/60  Interval HR  1 Minute HR 84  2 Minute HR 109  3 Minute HR 113  4 Minute HR 116  5 Minute HR 112  6 Minute HR 123  2 Minute Post HR 57  Interval Oxygen  Interval Oxygen? Yes  Baseline Oxygen Saturation % 95 %  1 Minute Oxygen Saturation % 90 %  1 Minute Liters of Oxygen 0 L  2 Minute Oxygen Saturation % 91 %  2 Minute Liters of Oxygen 0 L  3 Minute Oxygen Saturation % 90 %  3 Minute Liters of Oxygen 0 L  4 Minute Oxygen Saturation % 86 %  4 Minute Liters of Oxygen 0 L (increased to 2L)  5 Minute Oxygen Saturation % 93 %  5 Minute Liters of Oxygen 2 L  6 Minute Oxygen Saturation % 94 %  6 Minute Liters of Oxygen 2 L  2 Minute Post Oxygen Saturation % 96 %  2 Minute Post Liters of Oxygen 2 L   Patient needs 2 LPM with exertion.

## 2024-07-15 NOTE — Telephone Encounter (Signed)
 Copied from CRM [id number]. Topic: Clinical - Order For Equipment >> Jul 15, 2024  1:14 PM Isabell A wrote: Reason for CRM: Kyle Bullock from [facility] is requesting a new oxygen referral, she has routed a 6 MWT to [facility], it states he needs 2 liters per minute with exertion.   Callback number: [phone]   Routing to Dr. Geronimo to advise when he returns.  6 MWT will need to be added to ov note.

## 2024-07-15 NOTE — Addendum Note (Signed)
 Encounter addended by: Nicholaus Johnnie PARAS on: 07/15/2024 1:01 PM  Actions taken: Clinical Note Signed

## 2024-07-16 ENCOUNTER — Other Ambulatory Visit (HOSPITAL_COMMUNITY): Payer: Self-pay | Admitting: Internal Medicine

## 2024-07-18 ENCOUNTER — Telehealth: Payer: Self-pay | Admitting: Internal Medicine

## 2024-07-18 NOTE — Telephone Encounter (Signed)
 From Stacy with [facility]- The Oxygen test is inaccurate. the patient did not qualify for oxygen at rest on room air. A six-minute walk test was performed, but the documentation does not indicate how many liters of oxygen were required for the patient to recover after desaturation. The entire test notes 0 LPM. Additionally, a new order was placed for 2 LPM continuous, but it should state 2 LPM with exertion.

## 2024-07-19 ENCOUNTER — Encounter (HOSPITAL_COMMUNITY)
Admission: RE | Admit: 2024-07-19 | Discharge: 2024-07-19 | Disposition: A | Discharge status: Home / Self Care | Source: Ambulatory Visit | Attending: Internal Medicine | Admitting: Internal Medicine

## 2024-07-19 DIAGNOSIS — J849 Interstitial pulmonary disease, unspecified: Secondary | ICD-10-CM | POA: Diagnosis not present

## 2024-07-19 NOTE — Progress Notes (Signed)
 Daily Session Note  Patient Details  Name: Kyle Bullock MRN: [id number] Date of Birth: August 12, 1951 Referring Provider:   Conrad Ports Pulmonary Rehab Walk Test from 05/11/2024 in [facility] for Heart, Vascular, & Lung [facility]  Referring Provider [name]    Encounter Date: 07/19/2024  Check In:  Session Check In - 07/19/24 1201       Check-In   Supervising physician immediately available to respond to emergencies [facility] MD immediately available    Physician(s) Rosabel Mose, NP    Location [facility]    Staff Present Johnnie Moats, MS, ACSM-CEP, Exercise Physiologist;Salvador Coupe Claudene Maya Koyanagi, RN, BSN    Virtual Visit No    Medication changes reported     No    Fall or balance concerns reported    No    Tobacco Cessation No Change    Warm-up and Cool-down Performed as group-led instruction    Resistance Training Performed Yes    VAD Patient? No    PAD/SET Patient? No      Pain Assessment   Currently in Pain? No/denies    Multiple Pain Sites No          Capillary Blood Glucose: No results found for this or any previous visit (from the past 24 hours).    Social History   Tobacco Use  Smoking Status Former   Current packs/day: 0.00   Average packs/day: 2.0 packs/day for 40.0 years (80.0 ttl pk-yrs)   Types: Cigarettes   Start date: 11/10/1974   Quit date: 11/10/2014   Years since quitting: 9.6  Smokeless Tobacco Never    Goals Met:  Proper associated with RPD/PD & O2 Sat Independence with exercise equipment Exercise tolerated well No report of concerns or symptoms today Strength training completed today  Goals Unmet:  Not Applicable  Comments: Service time is from 1000 to 1137.    Dr. Slater Staff is Medical Director for Pulmonary Rehab at [facility].

## 2024-07-19 NOTE — Telephone Encounter (Signed)
 Walterine Raring, I received a message from Tammy with [facility]. She states My manager reviewed the testing and over-ruled [employer] and approved the Oxygen order.. Thank you for your help!

## 2024-07-19 NOTE — Telephone Encounter (Signed)
 Called pulm rehab and spoke with Hailey- pt has appt today there and asked if they could retest him to get him qualified for o2. I was advised that the RN working with the pt today will give me a call back to discuss Will await her call

## 2024-07-20 ENCOUNTER — Other Ambulatory Visit: Payer: Self-pay

## 2024-07-21 ENCOUNTER — Encounter (HOSPITAL_COMMUNITY)

## 2024-07-21 ENCOUNTER — Telehealth (HOSPITAL_BASED_OUTPATIENT_CLINIC_OR_DEPARTMENT_OTHER): Payer: Self-pay

## 2024-07-21 NOTE — Telephone Encounter (Unsigned)
 Copied from CRM #[id number]. Topic: Clinical - Medical Advice >> Jul 19, 2024  3:02 PM Celestine FALCON wrote: Reason for CRM: Pt is scheduled for 11/14/24 with Dr. Geronimo, but he would like the advice regarding pulm rehab strongly suggesting that the pt needs to be on 24/7 oxygen. Pt stated he would like the advice from Dr. Geronimo as well as exploring options for alternatives such as part time oxygen relief as he doesn't want to commit to 24/7 oxygen just yet.   Pt's phone number is [phone] ok to leave a vm.

## 2024-07-25 ENCOUNTER — Other Ambulatory Visit (HOSPITAL_COMMUNITY): Payer: Self-pay

## 2024-07-25 NOTE — Telephone Encounter (Signed)
 When I saw him 05/17/2024 - he desaturated walking 100 feet and he needed 4L [place] to correct. Based on this and recommendation of 2L [place]  Plan   -portable o2  2-4L [place] - use it for walking > 100 feet or even 50 feet  - ok to be without o2 at rest  - for night time, we need to retest him -> but he is seeing Dr Shlomo sleep doc on 8/20/;25 and can discuss with her  - at office visit, we can see if he needs 24/h because if the disease progresses this day will come

## 2024-07-25 NOTE — Telephone Encounter (Signed)
 I called the pt and there was no answer- I left him a detailed msg with MR's response per his request. I asked him to call back if he had any further questions.

## 2024-07-26 ENCOUNTER — Encounter (HOSPITAL_COMMUNITY)
Admission: RE | Admit: 2024-07-26 | Discharge: 2024-07-26 | Disposition: A | Discharge status: Home / Self Care | Source: Ambulatory Visit | Attending: Internal Medicine

## 2024-07-26 VITALS — Wt 210.8 lb

## 2024-07-26 DIAGNOSIS — J849 Interstitial pulmonary disease, unspecified: Secondary | ICD-10-CM

## 2024-07-26 NOTE — Progress Notes (Signed)
 Daily Session Note  Patient Details  Name: SOU NOHR MRN: [id number] Date of Birth: 19-Aug-1951 Referring Provider:   Conrad Ports Pulmonary Rehab Walk Test from 05/11/2024 in [facility] for Heart, Vascular, & Lung Health  Referring Provider [name]    Encounter Date: 07/26/2024  Check In:  Session Check In - 07/26/24 1155       Check-In   Supervising physician immediately available to respond to emergencies [facility] MD immediately available    Physician(s) Lum Louis, NP    [facility]    Staff Present Johnnie Moats, MS, ACSM-CEP, Exercise Physiologist;Judeth Gilles Claudene Maya Koyanagi, RN, Cathyann Levin, RN, BSN    Virtual Visit No    Medication changes reported     No    Fall or balance concerns reported    No    Tobacco Cessation No Change    Warm-up and Cool-down Performed as group-led Writer Performed Yes    VAD Patient? No    PAD/SET Patient? No      Pain Assessment   Currently in Pain? No/denies    Multiple Pain Sites No          Capillary Blood Glucose: No results found for this or any previous visit (from the past 24 hours).   Exercise Prescription Changes - 07/26/24 1100       Response to Exercise   Blood Pressure (Admit) 100/60    Blood Pressure (Exercise) 102/58    Blood Pressure (Exit) 100/50    Heart Rate (Admit) 58 bpm    Heart Rate (Exercise) 80 bpm    Heart Rate (Exit) 79 bpm    Oxygen Saturation (Admit) 96 %    Oxygen Saturation (Exercise) 91 %    Oxygen Saturation (Exit) 95 %    Rating of Perceived Exertion (Exercise) 12    Perceived Dyspnea (Exercise) 2    Duration Continue with 30 min of aerobic exercise without signs/symptoms of physical distress.    Intensity THRR unchanged      Progression   Progression Continue to progress workloads to maintain intensity without signs/symptoms of physical distress.      Resistance Training   Training  Prescription Yes    Weight black bands    Reps 10-15    Time 10 Minutes      Oxygen   Oxygen Intermittent    Liters 2      Treadmill   MPH 3    Grade 1    Minutes 15    METs 3.5      Bike   Level 3    Minutes 15    METs 2.9      Oxygen   Maintain Oxygen Saturation 88% or higher          Social History   Tobacco Use  Smoking Status Former   Current packs/day: 0.00   Average packs/day: 2.0 packs/day for 40.0 years (80.0 ttl pk-yrs)   Types: Cigarettes   Start date: 11/10/1974   Quit date: 11/10/2014   Years since quitting: 9.7  Smokeless Tobacco Never    Goals Met:  Proper associated with RPD/PD & O2 Sat Independence with exercise equipment Exercise tolerated well No report of concerns or symptoms today Strength training completed today  Goals Unmet:  Not Applicable  Comments: Service time is from 1023 to 1137.    Dr. Slater Staff is Medical Director for Pulmonary Rehab at [facility].

## 2024-07-27 ENCOUNTER — Encounter: Payer: Self-pay | Admitting: Cardiology

## 2024-07-27 ENCOUNTER — Ambulatory Visit: Attending: Cardiology | Admitting: Cardiology

## 2024-07-27 VITALS — BP 98/52 | HR 68 | Ht 75.0 in | Wt 208.2 lb

## 2024-07-27 DIAGNOSIS — G4739 Other sleep apnea: Secondary | ICD-10-CM

## 2024-07-27 DIAGNOSIS — G4733 Obstructive sleep apnea (adult) (pediatric): Secondary | ICD-10-CM | POA: Diagnosis not present

## 2024-07-27 NOTE — Patient Instructions (Signed)
 Medication Instructions:  Your physician recommends that you continue on your current medications as directed. Please refer to the Current Medication list given to you today.  *If you need a refill on your cardiac medications before your next appointment, please call your pharmacy*  Lab Work: None.  If you have labs (blood work) drawn today and your tests are completely normal, you will receive your results only by: MyChart Message (if you have MyChart) OR A paper copy in the mail If you have any lab test that is abnormal or we need to change your treatment, we will call you to review the results.  Testing/Procedures: None.  Follow-Up: At [facility], you and your health needs are our priority.  As part of our continuing mission to provide you with exceptional heart care, our providers are all part of one team.  This team includes your primary Cardiologist (physician) and Advanced Practice Providers or APPs (Physician Assistants and Nurse Practitioners) who all work together to provide you with the care you need, when you need it.  Your next appointment will be dependent on the delivery of your cpap supplies and it will be with:     Provider:   Dr. Wilbert Bihari, MD   We recommend signing up for the patient portal called MyChart.  Sign up information is provided on this After Visit Summary.  MyChart is used to connect with patients for Virtual Visits (Telemedicine).  Patients are able to view lab/test results, encounter notes, upcoming appointments, etc.  Non-urgent messages can be sent to your provider as well.   To learn more about what you can do with MyChart, go to [email or web address].   Other Instructions Please notify our office when your supplies are delivered. After we get a download from your machine, we will schedule you an appointment in our office.

## 2024-07-27 NOTE — Progress Notes (Signed)
 Sleep Medicine CONSULT Note    Date:  07/27/2024   ID:  Kyle Bullock, Kyle Bullock 09/17/1951, MRN [id number]  PCP:  Loreli Elsie JONETTA Mickey., MD  Cardiologist: Toribio Fuel, MD  Chief Complaint  Patient presents with   New Patient (Initial Visit)    Obstructive sleep apnea    History of Present Illness:  Kyle Bullock is a 73 y.o. male who is being seen today for the evaluation of obstructive sleep apnea at the request of Toribio Fuel, MD.  This is a 73 year old male with a history of CAD status post AICD, CHF, hyperlipidemia, hypertension, ischemic cardiomyopathy, NSVT.  In December 2024 he was seen by Harlene Gainer, FNP and was complaining of snoring.  He did not really have any issues with daytime sleepiness but would occasionally take a nap.  A home sleep study was done which showed severe obstructive sleep apnea with an AHI 50.5/h and moderate central sleep apnea with a central AHI of 22.9/h.  O2 saturation nadir was 81%.  He did have nocturnal hypoxemia with O2 sats less than 88% for 41 minutes.  There was also severe snoring.  He subsequently underwent CPAP titration but was unsuccessful due to ongoing respiratory events.  He was tried on auto CPAP from 4 to 20 cm H2O with a large Nuance Pro gel pillow mask.  He is referred now for sleep medicine consultation for treatment of obstructive sleep apnea  He is doing well with his PAP device and thinks that he has gotten used to it.  He tolerates the nasal pillow  mask and feels the pressure is adequate.  Since going on PAP he feels rested in the am and has no significant daytime sleepiness.  He denies any significant  nasal dryness or nasal congestion.  He has had problems with dry mouth.  He does not think that he snores. An Epworth Sleepiness Scale score was calculated the office today and this endorsed at 4 arguing against residual daytime sleepiness. Patient denies any episodes of bruxism, restless legs, No gagging hallucinations  or cataplectic events.    Past Medical History:  Diagnosis Date   Abnormal TSH    a. 11/2014: felt d/t sick euthyroid.   AICD (automatic cardioverter/defibrillator) present 02/28/2015   CAD (coronary artery disease)    a. 11/2014: arrest/STEMI s/p Xience DES to the LAD and PTCA to the D1 [place], [place]). b. Relook cath 11/2014: no culprit, patent stent.   CHF (congestive heart failure) (HCC)    Dressler's syndrome (HCC)    a. 11/2014: tx with colchicine  and prednisone .   Dyslipidemia    Dyspnea    a. 11/2014: possibly due to combo of CHF and Brilinta . Brilinta  changed to Effient .   Erectile dysfunction    a. Pt aware not to take ED med within 24 hr of NTG and vice versa.   Hypertension    Ischemic cardiomyopathy    a. 11/2014: EF reportedly 20% by cath and echo in [place].   NSVT (nonsustained ventricular tachycardia) (HCC) 11/16/14   a. 11/2014: admitted after discharge from STEMI admission, 18 beats 135-140bpm in ED.   Pericardial effusion    a. 11/2014.   Pericarditis dx'd 11/2014   Pneumonia 1990's X 2   RBBB    Sleep apnea    suspected but not diagnosed (02/28/2015)   STEMI (ST elevation myocardial infarction) (HCC) 11/10/2014   Tobacco abuse    VF (ventricular fibrillation) (HCC) 11/10/14   a. 11/2014: arrest with  STEMI.    Past Surgical History:  Procedure Laterality Date   A-FLUTTER ABLATION N/A 09/04/2020   Procedure: A-FLUTTER ABLATION;  Surgeon: Kelsie Agent, MD;  [facility];  Service: Cardiovascular;  Laterality: N/A;   ATRIAL FIBRILLATION ABLATION N/A 08/04/2022   Procedure: ATRIAL FIBRILLATION ABLATION;  Surgeon: Cindie Ole DASEN, MD;  Location: [facility];  Service: Cardiovascular;  Laterality: N/A;   CARDIAC CATHETERIZATION  11/19/2014   CARDIAC DEFIBRILLATOR PLACEMENT  02/28/2015   COLONOSCOPY WITH PROPOFOL  N/A 03/16/2023   Procedure: COLONOSCOPY WITH PROPOFOL ;  Surgeon: Federico Rosario BROCKS, MD;  Location: [facility];  Service:  Gastroenterology;  Laterality: N/A;   CORONARY ANGIOPLASTY WITH STENT PLACEMENT  11/10/2014   LAD DES   HAND SURGERY Bilateral    Dupuytren's Contracture   IMPLANTABLE CARDIOVERTER DEFIBRILLATOR IMPLANT N/A 02/28/2015   SJM Fortify Assura VR ICD implanted by Dr Kelsie.  Revascularized following VF arrest, EF remained depressed and ICD implanted   KNEE ARTHROSCOPY Right 12/08/1980   LEFT HEART CATH N/A 11/19/2014   Procedure: LEFT HEART CATH;  Surgeon: Dorn JINNY Lesches, MD;  Location: [facility] CATH LAB;  Service: Cardiovascular;  Laterality: N/A;   POLYPECTOMY  03/16/2023   Procedure: POLYPECTOMY;  Surgeon: Federico Rosario BROCKS, MD;  Location: [facility];  Service: Gastroenterology;;   RIGHT/LEFT HEART CATH AND CORONARY ANGIOGRAPHY N/A 07/22/2021   Procedure: RIGHT/LEFT HEART CATH AND CORONARY ANGIOGRAPHY;  Surgeon: Cherrie Toribio SAUNDERS, MD;  [facility];  Service: Cardiovascular;  Laterality: N/A;    Current Medications: Current Meds  Medication Sig   acetaminophen  (TYLENOL ) 500 MG tablet Take 500 mg by mouth every 8 (eight) hours as needed for moderate pain.   atorvastatin  (LIPITOR) 20 MG tablet TAKE 1 TABLET BY MOUTH DAILY   cyanocobalamin  (VITAMIN B12) 1000 MCG tablet Take 1,000 mcg by mouth daily.   ELIQUIS  5 MG TABS tablet TAKE 1 TABLET BY MOUTH 2 TIMES A DAY   furosemide  (LASIX ) 20 MG tablet TAKE 1 TABLET BY MOUTH DAILY AS NEEDED   levothyroxine (SYNTHROID) 112 MCG tablet Take 112 mcg by mouth daily before breakfast.   metoprolol  succinate (TOPROL -XL) 25 MG 24 hr tablet Take 1 tablet (25 mg total) by mouth at bedtime.   nitroGLYCERIN  (NITROSTAT ) 0.4 MG SL tablet DISSOLVE 1 TAB UNDER TONGUE FOR CHEST PAIN - IF PAIN REMAINS AFTER 5 MIN, CALL 911 AND REPEAT DOSE. MAX 3 TABS IN 15 MINUTES   omega-3 acid ethyl esters (LOVAZA) 1 g capsule Take 2 g by mouth 2 (two) times daily.   Pirfenidone  267 MG TABS Take 3 tablets (801 mg total) by mouth 3 (three) times daily with meals.    Probiotic Product (PROBIOTIC PO) Take 1 tablet by mouth every other day.   sacubitril -valsartan  (ENTRESTO ) 24-26 MG Take 1 tablet by mouth 2 (two) times daily.   spironolactone  (ALDACTONE ) 25 MG tablet Take 1 tablet (25 mg total) by mouth daily. (Patient taking differently: Take 12.5 mg by mouth daily.)   TRELEGY ELLIPTA 100-62.5-25 MCG/ACT AEPB Take 1 puff by mouth daily.    Allergies:   Ambien  [zolpidem  tartrate], Lisinopril , and Brilinta  [ticagrelor ]   Social History   Socioeconomic History   Marital status: Married    Spouse name: Not on file   Number of children: 2   Years of education: Not on file   Highest education level: High school graduate  Occupational History   Occupation: retired  Tobacco Use   Smoking status: Former  Current packs/day: 0.00    Average packs/day: 2.0 packs/day for 40.0 years (80.0 ttl pk-yrs)    Types: Cigarettes    Start date: 11/10/1974    Quit date: 11/10/2014    Years since quitting: 9.7   Smokeless tobacco: Never  Vaping Use   Vaping status: Never Used  Substance and Sexual Activity   Alcohol  use: Yes    Alcohol /week: 14.0 standard drinks of alcohol     Types: 14 Glasses of wine per week    Comment: 02/28/2015 2, 4oz glasses of wine/night   Drug use: No   Sexual activity: Not Currently  Other Topics Concern   Not on file  Social History Narrative   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  Attended [employer].  Now lives in [place].   Social Drivers of Corporate investment banker Strain: Not on file  Food Insecurity: Not on file  Transportation Needs: Not on file  Physical Activity: Not on file  Stress: Not on file  Social Connections: Not on file     Family History:  The patient's family history includes COPD in his mother; Crohn's disease in his brother; Heart disease in his mother; Hypertension in his father and mother; Prostate cancer in his father.   ROS:   Please see the history of present illness.    ROS All other  systems reviewed and are negative.      No data to display             PHYSICAL EXAM:   VS:  BP (!) 98/52   Pulse 68   Ht 6' 3 (1.905 m)   Wt 208 lb 3.2 oz (94.4 kg)   SpO2 98%   BMI 26.02 kg/m    GEN: Well nourished, well developed, in no acute distress  HEENT: normal  Neck: no JVD, carotid bruits, or masses Cardiac: RRR; no murmurs, rubs, or gallops,no edema.  Intact distal pulses bilaterally.  Respiratory:  clear to auscultation bilaterally, normal work of breathing GI: soft, nontender, nondistended, + BS MS: no deformity or atrophy  Skin: warm and dry, no rash Neuro:  Alert and Oriented x 3, Strength and sensation are intact Psych: euthymic mood, full affect  Wt Readings from Last 3 Encounters:  07/27/24 208 lb 3.2 oz (94.4 kg)  07/26/24 210 lb 12.2 oz (95.6 kg)  07/14/24 210 lb (95.3 kg)      Studies/Labs Reviewed:   HST, CPAP titration, PAP compliance download  Recent Labs: 09/23/2023: Magnesium 1.9 10/08/2023: Hemoglobin 14.0; Platelets 291.0 04/05/2024: B Natriuretic Peptide 778.7; BUN 20; Creatinine, Ser 1.19; Potassium 4.7; Sodium 138 05/17/2024: ALT 12     ASSESSMENT:    1. OSA (obstructive sleep apnea)   2. Complex sleep apnea syndrome      PLAN:  In order of problems listed above:  OSA - The patient is tolerating PAP therapy well without any problems. The PAP download performed by his DME was personally reviewed and interpreted by me today and showed an AHI of 11.9 /hr on auto CPAP from 4-20 cm H2O with 90% compliance in using more than 4 hours nightly.  The patient has been using and benefiting from PAP use and will continue to benefit from therapy.  - He continues to have events which may be related to his central apneas which are not can to be helped by CPAP alone - he is using a nasal cushion mask and has a dry mouth so may be mouth breathing which also can  increase events - I will order him a chin strap and also order an under the nose  FFM to try as well>>repeat download in 2 weeks after getting chin strap - if he continues to have increased events then would recommend going back to the sleep lab for a BiPAP S/T titration in which she will give him a backup respiratory rate to help with central events.  He is not a candidate for ASV due to his known cardiomyopathy. -If he needs a BiPAP titration  I will give him a rx for Lunesta 2mg  (Disp #1 tablet with 0 refills) to take when time to go to bed at sleep lab    Time Spent: 20 minutes total time of encounter, including 15 minutes spent in face-to-face patient care on the date of this encounter. This time includes coordination of care and counseling regarding above mentioned problem list. Remainder of non-face-to-face time involved reviewing chart documents/testing relevant to the patient encounter and documentation in the medical record. I have independently reviewed documentation from referring provider  Medication Adjustments/Labs and Tests Ordered: Current medicines are reviewed at length with the patient today.  Concerns regarding medicines are outlined above.  Medication changes, Labs and Tests ordered today are listed in the Patient Instructions below.  There are no Patient Instructions on file for this visit.   Signed, Wilbert Bihari, MD  07/27/2024 2:30 PM    [facility] [address] [place], [place], [place]  [address] Phone: [phone]; Fax: [phone]

## 2024-07-28 ENCOUNTER — Encounter (HOSPITAL_COMMUNITY)
Admission: RE | Admit: 2024-07-28 | Discharge: 2024-07-28 | Disposition: A | Discharge status: Home / Self Care | Source: Ambulatory Visit | Attending: Internal Medicine | Admitting: Internal Medicine

## 2024-07-28 DIAGNOSIS — J849 Interstitial pulmonary disease, unspecified: Secondary | ICD-10-CM | POA: Diagnosis not present

## 2024-07-28 NOTE — Progress Notes (Signed)
 Daily Session Note  Patient Details  Name: Kyle Bullock MRN: [id number] Date of Birth: December 15, 1950 Referring Provider:   Conrad Ports Pulmonary Rehab Walk Test from 05/11/2024 in [facility] for Heart, Vascular, & Lung Health  Referring Provider [name]    Encounter Date: 07/28/2024  Check In:  Session Check In - 07/28/24 1030       Check-In   Supervising physician immediately available to respond to emergencies [facility] MD immediately available    Physician(s) Rosaline Bane, NP    Staff Present Johnnie Moats, MS, ACSM-CEP, Exercise Physiologist;Jannifer Fischler Claudene Maya Koyanagi, RN, Cathyann Levin, RN, BSN;Randi Reeve BS, ACSM-CEP, Exercise Physiologist    Virtual Visit No    Medication changes reported     No    Fall or balance concerns reported    No    Tobacco Cessation No Change    Warm-up and Cool-down Performed as group-led instruction    Resistance Training Performed Yes    VAD Patient? No    PAD/SET Patient? No      Pain Assessment   Currently in Pain? No/denies          Capillary Blood Glucose: No results found for this or any previous visit (from the past 24 hours).    Social History   Tobacco Use  Smoking Status Former   Current packs/day: 0.00   Average packs/day: 2.0 packs/day for 40.0 years (80.0 ttl pk-yrs)   Types: Cigarettes   Start date: 11/10/1974   Quit date: 11/10/2014   Years since quitting: 9.7  Smokeless Tobacco Never    Goals Met:  Proper associated with RPD/PD & O2 Sat Independence with exercise equipment Exercise tolerated well No report of concerns or symptoms today Strength training completed today  Goals Unmet:  Not Applicable  Comments: Service time is from 1014 to 1150.    Dr. Slater Staff is Medical Director for Pulmonary Rehab at [facility].

## 2024-07-29 ENCOUNTER — Telehealth: Payer: Self-pay | Admitting: Internal Medicine

## 2024-07-29 ENCOUNTER — Other Ambulatory Visit: Payer: Self-pay

## 2024-07-29 DIAGNOSIS — J84112 Idiopathic pulmonary fibrosis: Secondary | ICD-10-CM

## 2024-07-29 MED ORDER — PIRFENIDONE 267 MG PO TABS
801.0000 mg | ORAL_TABLET | Freq: Three times a day (TID) | ORAL | 0 refills | Status: DC
  Filled 2024-07-29 – 2024-08-17 (×3): qty 270, 30d supply, fill #0

## 2024-07-29 NOTE — Telephone Encounter (Signed)
 Refill sent for PIRFENIDONE  to [facility]: [phone]   Dose: 801mg  three times dailyy  Last OV: 05/17/2024 Provider: Dr. Geronimo Pertinent labs: LFTs on 05/17/2024  Next OV: 12/8/025  Routing to scheduling team for follow-up on LAB appt scheduling  Sherry Pennant, PharmD, MPH, BCPS Clinical Pharmacist (Rheumatology and Pulmonology)

## 2024-08-02 ENCOUNTER — Other Ambulatory Visit: Payer: Self-pay | Admitting: Internal Medicine

## 2024-08-02 ENCOUNTER — Telehealth: Payer: Self-pay | Admitting: *Deleted

## 2024-08-02 ENCOUNTER — Other Ambulatory Visit: Payer: Self-pay

## 2024-08-02 ENCOUNTER — Encounter (HOSPITAL_COMMUNITY)
Admission: RE | Admit: 2024-08-02 | Discharge: 2024-08-02 | Disposition: A | Discharge status: Home / Self Care | Source: Ambulatory Visit | Attending: Internal Medicine | Admitting: Internal Medicine

## 2024-08-02 DIAGNOSIS — I10 Essential (primary) hypertension: Secondary | ICD-10-CM

## 2024-08-02 DIAGNOSIS — J849 Interstitial pulmonary disease, unspecified: Secondary | ICD-10-CM

## 2024-08-02 DIAGNOSIS — R0683 Snoring: Secondary | ICD-10-CM

## 2024-08-02 DIAGNOSIS — Z5181 Encounter for therapeutic drug level monitoring: Secondary | ICD-10-CM

## 2024-08-02 DIAGNOSIS — G4733 Obstructive sleep apnea (adult) (pediatric): Secondary | ICD-10-CM

## 2024-08-02 DIAGNOSIS — I2510 Atherosclerotic heart disease of native coronary artery without angina pectoris: Secondary | ICD-10-CM

## 2024-08-02 DIAGNOSIS — I5022 Chronic systolic (congestive) heart failure: Secondary | ICD-10-CM

## 2024-08-02 NOTE — Progress Notes (Signed)
 Daily Session Note  Patient Details  Name: Kyle Bullock MRN: [id number] Date of Birth: May 05, 1951 Referring Provider:   Conrad Ports Pulmonary Rehab Walk Test from 05/11/2024 in [facility] for Heart, Vascular, & Lung Health  Referring Provider [name]    Encounter Date: 08/02/2024  Check In:  Session Check In - 08/02/24 1029       Check-In   Supervising physician immediately available to respond to emergencies [facility] MD immediately available    Physician(s) Rosaline Bane, NP    Staff Present Johnnie Moats, MS, ACSM-CEP, Exercise Physiologist;Casey Claudene Candia Levin, RN, BSN;Cadience Bradfield BS, ACSM-CEP, Exercise Physiologist    Virtual Visit No    Medication changes reported     No    Fall or balance concerns reported    No    Tobacco Cessation No Change    Warm-up and Cool-down Performed as group-led instruction    Resistance Training Performed Yes    VAD Patient? No    PAD/SET Patient? No      Pain Assessment   Currently in Pain? No/denies    Multiple Pain Sites No          Capillary Blood Glucose: No results found for this or any previous visit (from the past 24 hours).    Social History   Tobacco Use  Smoking Status Former   Current packs/day: 0.00   Average packs/day: 2.0 packs/day for 40.0 years (80.0 ttl pk-yrs)   Types: Cigarettes   Start date: 11/10/1974   Quit date: 11/10/2014   Years since quitting: 9.7  Smokeless Tobacco Never    Goals Met:  Independence with exercise equipment Exercise tolerated well No report of concerns or symptoms today Strength training completed today  Goals Unmet:  Not Applicable  Comments: Service time is from 1020 to 1138.    Dr. Slater Staff is Medical Director for Pulmonary Rehab at [facility].

## 2024-08-02 NOTE — Telephone Encounter (Signed)
 D[facility]   Upon patient request DME selection is [facility].

## 2024-08-02 NOTE — Telephone Encounter (Signed)
 Per dr Shlomo Wilbert SAUNDERS, MD  P Cv Div Sleep Studies  Order an under the nose FFM and also a chin strap.  I need a download 2 weeks after he picks his equipment up

## 2024-08-04 ENCOUNTER — Encounter (HOSPITAL_COMMUNITY)
Admission: RE | Admit: 2024-08-04 | Discharge: 2024-08-04 | Disposition: A | Discharge status: Home / Self Care | Source: Ambulatory Visit | Attending: Internal Medicine

## 2024-08-04 ENCOUNTER — Other Ambulatory Visit (INDEPENDENT_AMBULATORY_CARE_PROVIDER_SITE_OTHER)

## 2024-08-04 ENCOUNTER — Ambulatory Visit: Payer: Self-pay | Admitting: Internal Medicine

## 2024-08-04 DIAGNOSIS — J849 Interstitial pulmonary disease, unspecified: Secondary | ICD-10-CM

## 2024-08-04 DIAGNOSIS — Z5181 Encounter for therapeutic drug level monitoring: Secondary | ICD-10-CM | POA: Diagnosis not present

## 2024-08-04 LAB — HEPATIC FUNCTION PANEL
ALT: 17 U/L (ref 0–53)
AST: 19 U/L (ref 0–37)
Albumin: 4.1 g/dL (ref 3.5–5.2)
Alkaline Phosphatase: 79 U/L (ref 39–117)
Bilirubin, Direct: 0.1 mg/dL (ref 0.0–0.3)
Total Bilirubin: 0.6 mg/dL (ref 0.2–1.2)
Total Protein: 6.7 g/dL (ref 6.0–8.3)

## 2024-08-04 NOTE — Progress Notes (Signed)
 Daily Session Note  Patient Details  Name: ROARK RUFO MRN: [id number] Date of Birth: September 30, 1951 Referring Provider:   Conrad Ports Pulmonary Rehab Walk Test from 05/11/2024 in [facility]  Referring Provider [name]    Encounter Date: 08/04/2024  Check In:  Session Check In - 08/04/24 1027       Check-In   Supervising physician immediately available to respond to emergencies [facility] MD immediately available    Physician(s) Jackee Wyn Raddle, NP    Location [facility]    Staff Present Johnnie Moats, MS, ACSM-CEP, Exercise Physiologist;Marisue Canion Claudene Candia Levin, RN, BSN;Randi Reeve BS, ACSM-CEP, Exercise Physiologist    Virtual Visit No    Medication changes reported     No    Fall or balance concerns reported    No    Tobacco Cessation No Change    Warm-up and Cool-down Performed as group-led instruction    Resistance Training Performed Yes    VAD Patient? No    PAD/SET Patient? No      Pain Assessment   Currently in Pain? No/denies    Multiple Pain Sites No          Capillary Blood Glucose: No results found for this or any previous visit (from the past 24 hours).    Social History   Tobacco Use  Smoking Status Former   Current packs/day: 0.00   Average packs/day: 2.0 packs/day for 40.0 years (80.0 ttl pk-yrs)   Types: Cigarettes   Start date: 11/10/1974   Quit date: 11/10/2014   Years since quitting: 9.7  Smokeless Tobacco Never    Goals Met:  Proper associated with RPD/PD & O2 Sat Independence with exercise equipment Exercise tolerated well No report of concerns or symptoms today Strength training completed today  Goals Unmet:  Not Applicable  Comments: Service time is from 1018 to 1140.    Dr. Slater Staff is Medical Director for Pulmonary Rehab at [facility].

## 2024-08-04 NOTE — Progress Notes (Signed)
 Home Exercise Prescription I have reviewed a Home Exercise Prescription with Kyle Bullock. Kyle Bullock is currently exericising at home. He is walking 3-5 days/wk for 30 min/day. I have no recommendations for progression on home exercise as pt is meeting minimum guidelines. Kyle Bullock is also considering paying for gym membership. I encouraged him to exercise at gym or outside. He agreed with my advise. I am confident in Kyle Bullock exercising outside of rehab. The patient stated that their goals were to maintain. We reviewed exercise guidelines, target heart rate during exercise, RPE Scale, weather conditions, endpoints for exercise, warmup and cool down. The patient is encouraged to come to me with any questions. I will continue to follow up with the patient to assist them with progression and safety. Spent 15 min with patient discussing home exercise plan and goals  Kyle JINNY Moats, MS, ACSM-CEP 08/04/2024 3:44 PM

## 2024-08-09 ENCOUNTER — Encounter (HOSPITAL_COMMUNITY)
Admission: RE | Admit: 2024-08-09 | Discharge: 2024-08-09 | Disposition: A | Discharge status: Home / Self Care | Source: Ambulatory Visit | Attending: Internal Medicine | Admitting: Internal Medicine

## 2024-08-09 DIAGNOSIS — J849 Interstitial pulmonary disease, unspecified: Secondary | ICD-10-CM | POA: Diagnosis not present

## 2024-08-09 DIAGNOSIS — G4733 Obstructive sleep apnea (adult) (pediatric): Secondary | ICD-10-CM | POA: Diagnosis not present

## 2024-08-09 NOTE — Progress Notes (Signed)
 Daily Session Note  Patient Details  Name: HAADI SANTELLAN MRN: [id number] Date of Birth: 02-22-1951 Referring Provider:   Conrad Ports Pul[facility] Rehab Walk Test from 05/11/2024 in [facility]  Referring Provider [name]    Encounter Date: 08/09/2024  Check In:  Session Check In - 08/09/24 1025       Check-In   Supervising physician immediately available to respond to emergencies [facility] MD immediately available    Physician(s) Josefa Beauvais, NP    Location [facility]    Staff Present Johnnie Moats, MS, ACSM-CEP, Exercise Physiologist;Casey Claudene Candia Levin, RN, BSN;Randi Reeve BS, ACSM-CEP, Exercise Physiologist    Virtual Visit No    Medication changes reported     No    Tobacco Cessation No Change    Warm-up and Cool-down Performed as group-led instruction    Resistance Training Performed Yes    VAD Patient? No    PAD/SET Patient? No      Pain Assessment   Currently in Pain? No/denies          Capillary Blood Glucose: No results found for this or any previous visit (from the past 24 hours).   Exercise Prescription Changes - 08/09/24 1100       Response to Exercise   Blood Pressure (Admit) 110/70    Blood Pressure (Exercise) 100/54    Blood Pressure (Exit) 96/50    Heart Rate (Admit) 60 bpm    Heart Rate (Exercise) 102 bpm    Heart Rate (Exit) 65 bpm    Oxygen Saturation (Admit) 98 %    Oxygen Saturation (Exercise) 92 %    Oxygen Saturation (Exit) 96 %    Rating of Perceived Exertion (Exercise) 13    Perceived Dyspnea (Exercise) 2    Duration Continue with 30 min of aerobic exercise without signs/symptoms of physical distress.    Intensity THRR unchanged      Progression   Progression Continue to progress workloads to maintain intensity without signs/symptoms of physical distress.      Resistance Training   Training Prescription Yes    Weight black bands    Reps 10-15    Time  10 Minutes      Oxygen   Oxygen Intermittent    Liters 2      Treadmill   MPH 3    Grade 1.5    Minutes 15    METs 3.5      Bike   Level 3.5    Minutes 15    METs 2.9      Oxygen   Maintain Oxygen Saturation 88% or higher          Social History   Tobacco Use  Smoking Status Former   Current packs/day: 0.00   Average packs/day: 2.0 packs/day for 40.0 years (80.0 ttl pk-yrs)   Types: Cigarettes   Start date: 11/10/1974   Quit date: 11/10/2014   Years since quitting: 9.7  Smokeless Tobacco Never    Goals Met:  Proper associated with RPD/PD & O2 Sat Independence with exercise equipment Exercise tolerated well No report of concerns or symptoms today Strength training completed today  Goals Unmet:  Not Applicable  Comments: Service time is from 1010 to 1132.    Dr. Slater Staff is Medical Director for Pulmonary Rehab at [facility].

## 2024-08-10 NOTE — Progress Notes (Signed)
 Discharge Progress Report  Patient Details  Name: Kyle Bullock MRN: [id number] Date of Birth: 1951-08-25 Referring Provider:   [facility]rad [facility]s [facility] Re[facility] Walk Test from 05/11/2024 in [facility]  Referring Provider [name]     Number of Visits: 70  Reason for Discharge:  Patient has met program and personal goals.  Smoking History:  Social History   Tobacco Use  Smoking Status Former   Current packs/day: 0.00   Average packs/day: 2.0 packs/day for 40.0 years (80.0 ttl pk-yrs)   Types: Cigarettes   Start date: 11/10/1974   Quit date: 11/10/2014   Years since quitting: 9.7  Smokeless Tobacco Never    Diagnosis:  ILD (interstitial lung disease) (HCC)  ADL [facility]:  Pulmonary Assessment Scores     Row Name 05/11/24 1102 07/26/24 1551 08/09/24 1534     ADL [facility]   ADL Phase Entry Exit --   SOB Score total 30 30 --     CAT Score   CAT Score 13 14 --     mMRC Score   mMRC Score 1 -- 1      Initial Exercise Prescription:  Initial Exercise Prescription - 05/11/24 1200       Date of Initial Exercise RX and Referring Provider   Date 05/11/24    Referring Provider [name]    Expected Discharge Date 08/09/24      Treadmill   MPH 2.5    Grade 1.5    Minutes 15      Bike   Level 1    Watts 40    Minutes 15    METs 2.5      Prescription Details   Frequency (times per week) 2    Duration Progress to 30 minutes of continuous aerobic without signs/symptoms of physical distress      Intensity   THRR 40-80% of Max Heartrate 58-117    Ratings of Perceived Exertion 11-13    Perceived Dyspnea 0-4      Progression   Progression Continue progressive overload as per policy without signs/symptoms or physical distress.      Resistance Training   Training Prescription Yes    Weight blue bands    Reps 10-15          Discharge Exercise Prescription (Final Exercise Prescription Changes):  Exercise  Prescription Changes - 08/09/24 1100       Response to Exercise   Blood Pressure (Admit) 110/70    Blood Pressure (Exercise) 100/54    Blood Pressure (Exit) 96/50    Heart Rate (Admit) 60 bpm    Heart Rate (Exercise) 102 bpm    Heart Rate (Exit) 65 bpm    Oxygen Saturation (Admit) 98 %    Oxygen Saturation (Exercise) 92 %    Oxygen Saturation (Exit) 96 %    Rating of Perceived Exertion (Exercise) 13    Perceived Dyspnea (Exercise) 2    Duration Continue with 30 min of aerobic exercise without signs/symptoms of physical distress.    Intensity THRR unchanged      Progression   Progression Continue to progress workloads to maintain intensity without signs/symptoms of physical distress.      Resistance Training   Training Prescription Yes    Weight black bands    Reps 10-15    Time 10 Minutes      Oxygen   Oxygen Intermittent    Liters 2      Treadmill  MPH 3    Grade 1.5    Minutes 15    METs 3.5      Bike   Level 3.5    Minutes 15    METs 2.9      Oxygen   Maintain Oxygen Saturation 88% or higher          Functional Capacity:  6 Minute Walk     Row Name 05/11/24 1150 06/23/24 1519 07/15/24 1308     6 Minute Walk   Phase Initial Mid Program Mid Program   Distance 1050 feet 1340 feet 1340 feet   Walk Time 6 minutes 6 minutes 6 minutes   # of Rest Breaks 0 0 --   MPH 1.99 2.54 --   METS 2.22 2.9 --   RPE 9 13 13    Perceived Dyspnea  1 2 2    VO2 Peak 7.75 10.16 --   Symptoms No No No   Resting HR 60 bpm 62 bpm 62 bpm   Resting BP 90/60 90/52 90/52    Resting Oxygen Saturation  94 % 95 % 95 %   Exercise Oxygen Saturation  during 6 min walk 90 % 86 % 86 %   Max Ex. HR 99 bpm 123 bpm 123 bpm   Max Ex. BP 102/56 100/60 100/60   2 Minute Post BP 104/56 100/50 --     Interval HR   1 Minute HR 85 84 84   2 Minute HR 98 109 109   3 Minute HR 99 113 113   4 Minute HR 91 116 116   5 Minute HR 88 112 112   6 Minute HR 99 123 123   2 Minute Post HR 57 57  57   Interval Heart Rate? Yes Yes --     Interval Oxygen   Interval Oxygen? Yes Yes Yes   Baseline Oxygen Saturation % 94 % 95 % 95 %   1 Minute Oxygen Saturation % 94 % 90 % 90 %   1 Minute Liters of Oxygen 0 L 0 L 0 L   2 Minute Oxygen Saturation % 90 % 91 % 91 %   2 Minute Liters of Oxygen 0 L 0 L 0 L   3 Minute Oxygen Saturation % 92 % 90 % 90 %   3 Minute Liters of Oxygen 0 L 0 L 0 L   4 Minute Oxygen Saturation % 90 % 86 %  87% at 4:30 86 %   4 Minute Liters of Oxygen 0 L 0 L  increased to 1L and then 2L 0 L  increased to 2L   5 Minute Oxygen Saturation % 90 % 93 % 93 %   5 Minute Liters of Oxygen 0 L 2 L 2 L   6 Minute Oxygen Saturation % 91 % 94 % 94 %   6 Minute Liters of Oxygen 0 L 2 L 2 L   2 Minute Post Oxygen Saturation % 95 % 96 % 96 %   2 Minute Post Liters of Oxygen 0 L 2 L 2 L    Row Name 08/09/24 1529         6 Minute Walk   Phase Discharge     Distance 1560 feet     Distance % Change 48.57 %     Distance Feet Change 510 ft     Walk Time 6 minutes     # of Rest Breaks 0     MPH 2.95  METS 3.38     RPE 13     Perceived Dyspnea  2     VO2 Peak 11.82     Symptoms No     Resting HR 87 bpm     Resting BP 110/60     Resting Oxygen Saturation  95 %     Exercise Oxygen Saturation  during 6 min walk 87 %     Max Ex. HR 109 bpm     Max Ex. BP 124/60     2 Minute Post BP 102/56       Interval HR   1 Minute HR 90     2 Minute HR 101     3 Minute HR 101     4 Minute HR 108     5 Minute HR 109     6 Minute HR 109     2 Minute Post HR 68     Interval Heart Rate? Yes       Interval Oxygen   Interval Oxygen? Yes     Baseline Oxygen Saturation % 95 %     1 Minute Oxygen Saturation % 95 %     1 Minute Liters of Oxygen 0 L     2 Minute Oxygen Saturation % 87 %     2 Minute Liters of Oxygen 0 L  increased to 2L     3 Minute Oxygen Saturation % 91 %     3 Minute Liters of Oxygen 2 L     4 Minute Oxygen Saturation % 92 %     4 Minute Liters of Oxygen 2  L     5 Minute Oxygen Saturation % 90 %     5 Minute Liters of Oxygen 2 L     6 Minute Oxygen Saturation % 89 %     6 Minute Liters of Oxygen 2 L     2 Minute Post Oxygen Saturation % 93 %     2 Minute Post Liters of Oxygen 2 L        Psychological, QOL, Others - Outcomes: PHQ 2/9:    07/26/2024    3:49 PM 05/11/2024   11:54 AM 04/25/2015    2:50 PM 12/27/2014    1:58 PM  Depression screen PHQ 2/9  Decreased Interest 1 0 0 0  Down, Depressed, Hopeless 1 0 0 1  PHQ - 2 Score 2 0 0 1  Altered sleeping 1 1    Tired, decreased energy 1 1    Change in appetite 0 0    Feeling bad or failure about yourself  0 0    Trouble concentrating 0 0    Moving slowly or fidgety/restless 0 0    Suicidal thoughts 0 0    PHQ-9 Score 4 2    Difficult doing work/chores Not difficult at all Not difficult at all      Quality of Life:   Personal Goals: Goals established at orientation with interventions provided to work toward goal.  Personal Goals and Risk Factors at Admission - 05/11/24 1056       Core Components/Risk Factors/Patient Goals on Admission    Weight Management Weight Loss;Yes    Intervention Weight Management: Develop a combined nutrition and exercise program designed to reach desired caloric intake, while maintaining appropriate intake of nutrient and fiber, sodium and fats, and appropriate energy expenditure required for the weight goal.;Weight Management: Provide education and appropriate resources to help participant work on and attain  dietary goals.;Weight Management/Obesity: Establish reasonable short term and long term weight goals.;Obesity: Provide education and appropriate resources to help participant work on and attain dietary goals.    Expected Outcomes Short Term: Continue to assess and modify interventions until short term weight is achieved;Long Term: Adherence to nutrition and physical activity/exercise program aimed toward attainment of established weight goal;Weight  Maintenance: Understanding of the daily nutrition guidelines, which includes 25-35% calories from fat, 7% or less cal from saturated fats, less than 200mg  cholesterol, less than 1.5gm of sodium, & 5 or more servings of fruits and vegetables daily;Weight Loss: Understanding of general recommendations for a balanced deficit meal plan, which promotes 1-2 lb weight loss per week and includes a negative energy balance of [phone] kcal/d;Understanding recommendations for meals to include 15-35% energy as protein, 25-35% energy from fat, 35-60% energy from carbohydrates, less than 200mg  of dietary cholesterol, 20-35 gm of total fiber daily;Understanding of distribution of calorie intake throughout the day with the consumption of 4-5 meals/snacks    Improve shortness of breath with ADL's Yes    Intervention Provide education, individualized exercise plan and daily activity instruction to help decrease symptoms of SOB with activities of daily living.    Expected Outcomes Short Term: Improve cardiorespiratory fitness to achieve a reduction of symptoms when performing ADLs;Long Term: Be able to perform more ADLs without symptoms or delay the onset of symptoms           Personal Goals Discharge:  Goals and Risk Factor Review     Row Name 05/13/24 1329 06/02/24 0915 07/06/24 1256 08/10/24 0935       Core Components/Risk Factors/Patient Goals Review   Personal Goals Review Weight Management/Obesity;Improve shortness of breath with ADL's;Develop more efficient breathing techniques such as purse lipped breathing and diaphragmatic breathing and practicing self-pacing with activity. Weight Management/Obesity;Improve shortness of breath with ADL's;Develop more efficient breathing techniques such as purse lipped breathing and diaphragmatic breathing and practicing self-pacing with activity. Weight Management/Obesity;Improve shortness of breath with ADL's;Develop more efficient breathing techniques such as purse lipped  breathing and diaphragmatic breathing and practicing self-pacing with activity. Weight Management/Obesity;Improve shortness of breath with ADL's    Review Gordy is scheduled to start PR on 05/19/24. Unable to assess his goals at this time. Monthly review of patient's Core Components/Risk Factors/Patient Goals are as follows: Goal progressing for improving shortness of breath with ADL's. Gordy is currently exercising on RA to maintain sats >88%. He is exercising on the treadmill and the bike. Goal progressing for developing more efficient breathing techniques such as purse lipped breathing and diaphragmatic breathing; and practicing self-pacing with activity. Goal progressing for weight loss. He is working with the staff dietitician on ways to lose weight. We will continue to monitor his progress throughout the program. Monthly review of patient's Core Components/Risk Factors/Patient Goals are as follows: Goal progressing for improving shortness of breath with ADL's. Gordy had to start using 2L while exercising on the treadmill to maintain sats >88%. He is able to maintain sats>88% on RA while exercising on the bike. Goal met for developing more efficient breathing techniques such as purse lip breathing and diaphragmatic breathing; and practicing self-pacing with activity. Gordy has attended the breathing technique class and is able to demonstrate PLB when he gets SOB. He also knows how to self pace based on the RPE scale. Goal progressing for weight loss. We will continue to monitor his progress throughout the program. Gordy graduated from the PR program on 08/09/24. Gordy unfortunately did not  meet his goal for weight loss during the program. He did meet his goal for improving SOB with ADL's. He states he is able to do more around the yard and home with less SOB. Gordy did great during the program and we wish him the best.    Expected Outcomes To improve shortness of breath with ADL's, develop more efficient breathing techniques  such as purse lipped breathing and diaphragmatic breathing; and practicing self-pacing with activity and lose weight To improve shortness of breath with ADL's, develop more efficient breathing techniques such as purse lipped breathing and diaphragmatic breathing; and practicing self-pacing with activity and lose weight To improve shortness of breath with ADL's and lose weight To continue to exercise and modify his nutrition and lifestyle post graduation       Exercise Goals and Review:  Exercise Goals     Row Name 05/11/24 1152             Exercise Goals   Increase Physical Activity Yes       Intervention Provide advice, education, support and counseling about physical activity/exercise needs.;Develop an individualized exercise prescription for aerobic and resistive training based on initial evaluation findings, risk stratification, comorbidities and participant's personal goals.       Expected Outcomes Short Term: Attend rehab on a regular basis to increase amount of physical activity.;Long Term: Add in home exercise to make exercise part of routine and to increase amount of physical activity.;Long Term: Exercising regularly at least 3-5 days a week.       Increase Strength and Stamina Yes       Intervention Provide advice, education, support and counseling about physical activity/exercise needs.;Develop an individualized exercise prescription for aerobic and resistive training based on initial evaluation findings, risk stratification, comorbidities and participant's personal goals.       Expected Outcomes Short Term: Increase workloads from initial exercise prescription for resistance, speed, and METs.;Short Term: Perform resistance training exercises routinely during rehab and add in resistance training at home;Long Term: Improve cardiorespiratory fitness, muscular endurance and strength as measured by increased METs and functional capacity ( )       Able to understand and use rate of  perceived exertion (RPE) scale Yes       Intervention Provide education and explanation on how to use RPE scale       Expected Outcomes Short Term: Able to use RPE daily in rehab to express subjective intensity level;Long Term:  Able to use RPE to guide intensity level when exercising independently       Able to understand and use Dyspnea scale Yes       Intervention Provide education and explanation on how to use Dyspnea scale       Expected Outcomes Short Term: Able to use Dyspnea scale daily in rehab to express subjective sense of shortness of breath during exertion;Long Term: Able to use Dyspnea scale to guide intensity level when exercising independently       Knowledge and understanding of Target Heart Rate Range (THRR) Yes       Intervention Provide education and explanation of THRR including how the numbers were predicted and where they are located for reference       Expected Outcomes Short Term: Able to state/look up THRR;Long Term: Able to use THRR to govern intensity when exercising independently;Short Term: Able to use daily as guideline for intensity in rehab       Understanding of Exercise Prescription Yes  Intervention Provide education, explanation, and written materials on patient's individual exercise prescription       Expected Outcomes Short Term: Able to explain program exercise prescription;Long Term: Able to explain home exercise prescription to exercise independently          Exercise Goals Re-Evaluation:  Exercise Goals Re-Evaluation     Row Name 05/11/24 1152 06/13/24 0854 07/06/24 1046         Exercise Goal Re-Evaluation   Exercise Goals Review Increase Physical Activity;Able to understand and use Dyspnea scale;Understanding of Exercise Prescription;Increase Strength and Stamina;Knowledge and understanding of Target Heart Rate Range (THRR);Able to understand and use rate of perceived exertion (RPE) scale Increase Physical Activity;Able to understand and use  Dyspnea scale;Understanding of Exercise Prescription;Increase Strength and Stamina;Knowledge and understanding of Target Heart Rate Range (THRR);Able to understand and use rate of perceived exertion (RPE) scale Increase Physical Activity;Able to understand and use Dyspnea scale;Understanding of Exercise Prescription;Increase Strength and Stamina;Knowledge and understanding of Target Heart Rate Range (THRR);Able to understand and use rate of perceived exertion (RPE) scale     Comments Patient is scheduled to begin exercise on 6/12. Gordy has completed 6 exercise sessions. He exercises for 15 min on the treadmill and upright bike. He averages 2.8 METs at level 2 on the upright bike and 3.1 METs at 2.5 mph and 1% incline on the treadmill. He performs the warmup and cooldown standing without limitations. He has increased his incline on the treadmill. Upright bike level remains the same. Will encourage him to increase the level on the upright bike. Will continue to monitor and progress as able. Gordy has completed 12 exercise sessions. He exercises for 15 min on the upright bike and treadmill. Jay averages 3.5 METs at level 3 on the upright bike and 3.5 METs at 3 mph and 1% incline on the treadmill. He has increased his level on the upright bike and speed on the treadmill. Gordy seems motivated to exercise. Will continue to monitor and progress as able.     Expected Outcomes Through exercise at rehab and home, the patient will decrease shortness of breath with daily activities and feel confident in carrying out an exercise regimen at home. Through exercise at rehab and home, the patient will decrease shortness of breath with daily activities and feel confident in carrying out an exercise regimen at home. Through exercise at rehab and home, the patient will decrease shortness of breath with daily activities and feel confident in carrying out an exercise regimen at home.        Nutrition & Weight -  Outcomes:    Nutrition:  Nutrition Therapy & Goals - 07/19/24 1114       Nutrition Therapy   Diet Heart Healthy Diet    Drug/Food Interactions Statins/Certain Fruits      Personal Nutrition Goals   Nutrition Goal Patient to improve diet quality by using the plate method as a guide for meal planning to include lean protein/plant protein, fruits, vegetables, whole grains, nonfat dairy as part of a well-balanced diet.    Comments Goals in progress. Gordy has medical history of chronic systolic heart failure, CAD w/ MI 2015, afib, hx of tobacco abuse, emphysema/ILD, OSA. He is taking esbriet  for IPF; he reports side effects of rash but reports stable appetite/eating patterns. He reports motivation to lose some weight; he is down 1.5# since starting with our program; BMI remains appropriate for age.Patient will benefit from participation in pulmonary rehab for nutrition, exercise, and lifestyle modification.  Intervention Plan   Intervention Nutrition handout(s) given to patient.;Prescribe, educate and counsel regarding individualized specific dietary modifications aiming towards targeted core components such as weight, hypertension, lipid management, diabetes, heart failure and other comorbidities.    Expected Outcomes Short Term Goal: Understand basic principles of dietary content, such as calories, fat, sodium, cholesterol and nutrients.;Long Term Goal: Adherence to prescribed nutrition plan.          Nutrition Discharge:  Nutrition Assessments - 05/24/24 1204       Rate Your Plate Scores   Pre Score 63          Education Questionnaire Score:  Knowledge Questionnaire Score - 07/26/24 1551       Knowledge Questionnaire Score   Post Score 17/18          Goals reviewed with patient; copy given to patient.

## 2024-08-12 ENCOUNTER — Encounter: Payer: Self-pay | Admitting: Cardiology

## 2024-08-12 DIAGNOSIS — R0683 Snoring: Secondary | ICD-10-CM

## 2024-08-12 DIAGNOSIS — I5022 Chronic systolic (congestive) heart failure: Secondary | ICD-10-CM

## 2024-08-12 DIAGNOSIS — I2510 Atherosclerotic heart disease of native coronary artery without angina pectoris: Secondary | ICD-10-CM

## 2024-08-12 DIAGNOSIS — I10 Essential (primary) hypertension: Secondary | ICD-10-CM

## 2024-08-12 DIAGNOSIS — G4733 Obstructive sleep apnea (adult) (pediatric): Secondary | ICD-10-CM

## 2024-08-15 NOTE — Telephone Encounter (Signed)
 Order placed to [facility] via community message.

## 2024-08-15 NOTE — Telephone Encounter (Signed)
 Per Dr Shlomo, - I will order him a chin strap and also order an under the nose FFM to try as well>>repeat download in 2 weeks after getting chin strap

## 2024-08-16 DIAGNOSIS — J849 Interstitial pulmonary disease, unspecified: Secondary | ICD-10-CM | POA: Diagnosis not present

## 2024-08-16 DIAGNOSIS — G4733 Obstructive sleep apnea (adult) (pediatric): Secondary | ICD-10-CM | POA: Diagnosis not present

## 2024-08-16 DIAGNOSIS — J9611 Chronic respiratory failure with hypoxia: Secondary | ICD-10-CM | POA: Diagnosis not present

## 2024-08-17 ENCOUNTER — Encounter (INDEPENDENT_AMBULATORY_CARE_PROVIDER_SITE_OTHER): Payer: Self-pay

## 2024-08-17 ENCOUNTER — Other Ambulatory Visit: Payer: Self-pay

## 2024-08-17 ENCOUNTER — Other Ambulatory Visit (HOSPITAL_COMMUNITY): Payer: Self-pay

## 2024-08-17 NOTE — Progress Notes (Signed)
 Specialty Pharmacy Refill Coordination Note  MyChart Questionnaire Submission  Kyle Bullock is a 73 y.o. male contacted today regarding refills of specialty medication(s) Pirfenidone .  Doses on hand: (Patient-Rptd) 35   Patient requested: (Patient-Rptd) Delivery   Delivery date: 08/19/24  Verified address: [address] [place] [address]  Medication will be filled on 08/18/24.

## 2024-08-24 ENCOUNTER — Ambulatory Visit (HOSPITAL_BASED_OUTPATIENT_CLINIC_OR_DEPARTMENT_OTHER)
Admission: RE | Admit: 2024-08-24 | Discharge: 2024-08-24 | Disposition: A | Discharge status: Home / Self Care | Source: Ambulatory Visit | Attending: Internal Medicine | Admitting: Internal Medicine

## 2024-08-24 ENCOUNTER — Other Ambulatory Visit (HOSPITAL_COMMUNITY): Payer: Self-pay

## 2024-08-24 VITALS — BP 100/54 | HR 68 | Wt 208.2 lb

## 2024-08-24 DIAGNOSIS — Z95810 Presence of automatic (implantable) cardiac defibrillator: Secondary | ICD-10-CM

## 2024-08-24 DIAGNOSIS — J84112 Idiopathic pulmonary fibrosis: Secondary | ICD-10-CM

## 2024-08-24 DIAGNOSIS — K521 Toxic gastroenteritis and colitis: Secondary | ICD-10-CM

## 2024-08-24 DIAGNOSIS — J8410 Pulmonary fibrosis, unspecified: Secondary | ICD-10-CM | POA: Diagnosis not present

## 2024-08-24 DIAGNOSIS — I700 Atherosclerosis of aorta: Secondary | ICD-10-CM | POA: Diagnosis not present

## 2024-08-24 DIAGNOSIS — R0902 Hypoxemia: Secondary | ICD-10-CM | POA: Insufficient documentation

## 2024-08-24 DIAGNOSIS — J432 Centrilobular emphysema: Secondary | ICD-10-CM | POA: Insufficient documentation

## 2024-08-24 DIAGNOSIS — Z5181 Encounter for therapeutic drug level monitoring: Secondary | ICD-10-CM | POA: Diagnosis not present

## 2024-08-24 DIAGNOSIS — I519 Heart disease, unspecified: Secondary | ICD-10-CM

## 2024-08-24 DIAGNOSIS — J849 Interstitial pulmonary disease, unspecified: Secondary | ICD-10-CM | POA: Diagnosis not present

## 2024-08-24 DIAGNOSIS — I5022 Chronic systolic (congestive) heart failure: Secondary | ICD-10-CM | POA: Diagnosis not present

## 2024-08-24 DIAGNOSIS — L270 Generalized skin eruption due to drugs and medicaments taken internally: Secondary | ICD-10-CM | POA: Diagnosis not present

## 2024-08-24 DIAGNOSIS — R918 Other nonspecific abnormal finding of lung field: Secondary | ICD-10-CM | POA: Diagnosis not present

## 2024-08-24 DIAGNOSIS — I2510 Atherosclerotic heart disease of native coronary artery without angina pectoris: Secondary | ICD-10-CM | POA: Diagnosis not present

## 2024-08-24 DIAGNOSIS — G4739 Other sleep apnea: Secondary | ICD-10-CM

## 2024-08-24 MED ORDER — EMPAGLIFLOZIN 10 MG PO TABS: 10.0000 mg | ORAL_TABLET | Freq: Every day | ORAL | 11 refills | Status: AC

## 2024-08-24 NOTE — Patient Instructions (Signed)
 Start Jardiance  10 mg daily - Rx sent. Return to see Dr. Cherrie in 6 months.  PLEASE CALL [phone] IN FEBRUARY TO SCHEDULE THIS APPOINTMENT.  Please call us  at [phone] if any questions or concerns prior to your next appointment.

## 2024-08-24 NOTE — Progress Notes (Signed)
 Patient ID: Kyle Bullock, male   DOB: 1951/05/03, 73 y.o.   MRN: [id number]    Advanced Heart Failure Clinic Note    History of Present Illness:  Kyle Bullock is a 73 y.o. male who is referred to the HF Clinic by Dr. Wonda. He has h/o CAD and ischemic cardiomyopathy 20-25%.  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    In December 2015, he suffered an acute anterolateral MI complicated by VF arrest in [place] . He underwent drug-eluting stent placement to the LAD and PTCA of the diagonal. EF was 25% and a life vest was placed prior to discharge. Following return to [place], he developed dyspnea and presented to [facility] where he was readmitted. His Brilinta  was switched to Effient  as it was felt the Brilinta  may have been playing a role in his dyspnea. He was also switched from an ACE inhibitor to an ARB. He was seen by electrophysiology with recommendation for ongoing life vest therapy and follow-up echo in 90 days post revascularization. He was then seen in clinic in mid December where he reported pleuritic chest pain. He was readmitted and underwent diagnostic catheterization revealing patency of previously placed stent and area of balloon angioplasty in the diagonal. EF was 20-25% with diffuse hypokinesis and normal pericardial appearance. He did have some hypotension requiring discontinuation of ARB therapy. His beta blocker was also down titrated. He was diagnosed with Dressler's syndrome and was placed on prednisone  taper and colchicine .  CT with COPD. Referred to Dr. Alaine who felt he had mild COPD but no ILD. Given PRN albuterol . Has repeat CT scheduled for 1 year.   Had ATP in 7/22. Found on device interrogation. Seen by EP. According to EP device showed initially irregular arrhythmia concerning for Afib. He then developed a regular tachy arrhythmia (likely VT) which was terminated with ATP. After ATP, his rhythm was again regular. Zio placed showed SVT with  some ectopy. No VT or AF.   Cath in 8/22 for CP with stable CAD.   Had ICD shock 12/29 while he was stacking wood and carrying large loads, when he received an ICD shock. Seen by EP. Felt to have AF/AFL possibly leading to VT. Zio placed showing 5% AF/AFL. Started on amio. Eliquis  resumed.   Echo 1/23 EF 25-30%   Had AF ablation 08/04/22.   In May 2024. Hi-res CT: + ILD. Saw Dr. Darlean and Lambert amio stopped. Started prednisone   Echo 10/24 EF 25-30% RV mildly reduced Personally reviewed  Returns for f/u with his wife. Has been following with Dr. Geronimo for ILD. Now on pirfenidone . Completed CR 2-3 weeks ago. Breathing slightly improved. Walking 1.5 miles 3-4x/week. No CP. No edema, orthopnea or PND. + ab bloating. Takes lasix  2x/week. Weight stable 206-208. SBP typically in 90s  ICD interrogated personally. No VT/AF. Volume ok.   CPX 07/06/23  FVC 3.02 (62%)      FEV1 2.41 (66%)        FEV1/FVC 80 (106%)        MVV 119 (84%)   BP rest: 94/60 Standing BP: 90/62 BP peak: 132/60  Peak VO2: 15.0 (57% predicted peak VO2)  VE/VCO2 slope:  44  OUES: 1.63  Peak RER: 1.12    Cardiac studies:   Cath 8/22   Prox RCA to Mid RCA lesion is 55% stenosed.   1st Mrg lesion is 30% stenosed.   Ost LAD to Prox LAD lesion is 30% stenosed.  Previously placed Prox LAD to Dist LAD stent (unknown type) is  widely patent.   Findings:   Ao = 104/57 (77)  LV = 107/7 RA =  3 RV = 23/1 PA = 21/4 (10) PCW = 3 Fick cardiac output/index = 6.0/2.7 PVR = 1.2 WU Ao sat = 99% PA sat = 74%, 74%   Assessment: CAD with widely patent LAD stent Non-obstructive CAD in RCA and LCX with ~ 60% lesion throughout [facility] ICM with EF 25% Low filling pressures with preserved CO    Echo 3/16 EF 25-30% Echo 03/2016 EF 20-25% Echo 9/18 EF 20-25% RV normal   CPX 2/18 FVC 3.49 (72%)      FEV1 2.49 (68%)        FEV1/FVC 71 (93%)        MVV 117 (82%) Resting HR: 62 Peak HR: 148   (97% age predicted max  HR) BP rest: 84/60 BP peak: 156/62 Peak VO2: 19.6 (69% predicted peak VO2) VE/VCO2 slope:  37 OUES: 2.14 Peak RER: 1.06 Ventilatory Threshold: 17.4 (61% predicted or measured peak VO2) VE/MVV:  66% O2pulse:  12   (71% predicted O2pulse)  CPX 05/14/2015  Peak VO2: 19.0 (64.5% predicted peak VO2) VE/VCO2 slope:  41.5 OUES: 2.01 Peak RER: 1.06 Ventilatory Threshold: 14.1          (47.9% predicted or measured peak VO2)   Past Medical History:  Diagnosis Date   Abnormal TSH    a. 11/2014: felt d/t sick euthyroid.   AICD (automatic cardioverter/defibrillator) present 02/28/2015   CAD (coronary artery disease)    a. 11/2014: arrest/STEMI s/p Xience DES to the LAD and PTCA to the D1 [place], [place]). b. Relook cath 11/2014: no culprit, patent stent.   CHF (congestive heart failure) (HCC)    Dressler's syndrome (HCC)    a. 11/2014: tx with colchicine  and prednisone .   Dyslipidemia    Dyspnea    a. 11/2014: possibly due to combo of CHF and Brilinta . Brilinta  changed to Effient .   Erectile dysfunction    a. Pt aware not to take ED med within 24 hr of NTG and vice versa.   Hypertension    Ischemic cardiomyopathy    a. 11/2014: EF reportedly 20% by cath and echo in [place].   NSVT (nonsustained ventricular tachycardia) (HCC) 11/16/14   a. 11/2014: admitted after discharge from STEMI admission, 18 beats 135-140bpm in ED.   Pericardial effusion    a. 11/2014.   Pericarditis dx'd 11/2014   Pneumonia 1990's X 2   RBBB    Sleep apnea    suspected but not diagnosed (02/28/2015)   STEMI (ST elevation myocardial infarction) (HCC) 11/10/2014   Tobacco abuse    VF (ventricular fibrillation) (HCC) 11/10/14   a. 11/2014: arrest with STEMI.    Past Surgical History:  Procedure Laterality Date   A-FLUTTER ABLATION N/A 09/04/2020   Procedure: A-FLUTTER ABLATION;  Surgeon: Kelsie Agent, MD;  Location: [facility] INVASIVE CV LAB;  Service: Cardiovascular;  Laterality: N/A;   ATRIAL FIBRILLATION ABLATION  N/A 08/04/2022   Procedure: ATRIAL FIBRILLATION ABLATION;  Surgeon: Cindie Ole DASEN, MD;  Location: [facility] INVASIVE CV LAB;  Service: Cardiovascular;  Laterality: N/A;   CARDIAC CATHETERIZATION  11/19/2014   CARDIAC DEFIBRILLATOR PLACEMENT  02/28/2015   COLONOSCOPY WITH PROPOFOL  N/A 03/16/2023   Procedure: COLONOSCOPY WITH PROPOFOL ;  Surgeon: Federico Rosario BROCKS, MD;  Location: [facility] ENDOSCOPY;  Service: Gastroenterology;  Laterality: N/A;   CORONARY ANGIOPLASTY WITH STENT PLACEMENT  11/10/2014   LAD  DES   HAND SURGERY Bilateral    Dupuytren's Contracture   IMPLANTABLE CARDIOVERTER DEFIBRILLATOR IMPLANT N/A 02/28/2015   SJM Fortify Assura VR ICD implanted by Dr Kelsie.  Revascularized following VF arrest, EF remained depressed and ICD implanted   KNEE ARTHROSCOPY Right 12/08/1980   LEFT HEART CATH N/A 11/19/2014   Procedure: LEFT HEART CATH;  Surgeon: Dorn JINNY Lesches, MD;  Location: [facility] CATH LAB;  Service: Cardiovascular;  Laterality: N/A;   POLYPECTOMY  03/16/2023   Procedure: POLYPECTOMY;  Surgeon: Federico Rosario BROCKS, MD;  Location: [facility] ENDOS[facility];  Service: Gastroenterology;;   RIGHT/LEFT HEART CATH AND CORONARY ANGIOGRAPHY N/A 07/22/2021   Procedure: RIGHT/LEFT HEART CATH AND CORONARY ANGIOGRAPHY;  Surgeon: Cherrie Toribio SAUNDERS, MD;  Location: [facility];  Service: Cardiovascular;  Laterality: N/A;    Current Outpatient Medications  Medication Sig Dispense Refill   acetaminophen  (TYLENOL ) 500 MG tablet Take 500 mg by mouth every 8 (eight) hours as needed for moderate pain.     atorvastatin  (LIPITOR) 20 MG tablet TAKE 1 TABLET BY MOUTH DAILY 90 tablet 3   cyanocobalamin  (VITAMIN B12) 1000 MCG tablet Take 1,000 mcg by mouth daily.     ELIQUIS  5 MG TABS tablet TAKE 1 TABLET BY MOUTH 2 TIMES A DAY 60 tablet 5   furosemide  (LASIX ) 20 MG tablet TAKE 1 TABLET BY MOUTH DAILY AS NEEDED 30 tablet 2   levothyroxine (SYNTHROID) 112 MCG tablet Take 112 mcg by mouth daily before breakfast.     metoprolol   succinate (TOPROL -XL) 25 MG 24 hr tablet Take 1 tablet (25 mg total) by mouth at bedtime. 90 tablet 3   nitroGLYCERIN  (NITROSTAT ) 0.4 MG SL tablet DISSOLVE 1 TAB UNDER TONGUE FOR CHEST PAIN - IF PAIN REMAINS AFTER 5 MIN, CALL 911 AND REPEAT DOSE. MAX 3 TABS IN 15 MINUTES 25 tablet 3   omega-3 acid ethyl esters (LOVAZA) 1 g capsule Take 2 g by mouth 2 (two) times daily.     Pirfenidone  267 MG TABS Take 3 tablets (801 mg total) by mouth 3 (three) times daily with meals. 270 tablet 0   sacubitril -valsartan  (ENTRESTO ) 24-26 MG Take 1 tablet by mouth 2 (two) times daily. 180 tablet 3   spironolactone  (ALDACTONE ) 25 MG tablet Take 1 tablet (25 mg total) by mouth daily. 30 tablet 11   TRELEGY ELLIPTA 100-62.5-25 MCG/ACT AEPB Take 1 puff by mouth daily.     No current facility-administered medications for this encounter.    Allergies:   Ambien  [zolpidem  tartrate], Lisinopril , and Brilinta  [ticagrelor ]   Social History:  The patient  reports that he quit smoking about 9 years ago. His smoking use included cigarettes. He started smoking about 49 years ago. He has a 80 pack-year smoking history. He has never used smokeless tobacco. He reports current alcohol  use of about 14.0 standard drinks of alcohol  per week. He reports that he does not use drugs.   Family History:  The patient's  family history includes COPD in his mother; Crohn's disease in his brother; Heart disease in his mother; Hypertension in his father and mother; Prostate cancer in his father.   Review of systems complete and found to be negative unless listed in HPI.     Vitals:   08/24/24 1538  BP: (!) 100/54  Pulse: 68  SpO2: 95%  Weight: 94.4 kg (208 lb 3.2 oz)      Wt Readings from Last 3 Encounters:  08/24/24 94.4 kg (208 lb 3.2 oz)  07/27/24 94.4 kg (  208 lb 3.2 oz)  07/26/24 95.6 kg (210 lb 12.2 oz)   Physical exam: General:  Well appearing. No resp difficulty HEENT: normal Neck: supple. no JVD. Carotids 2+ bilat; no  bruits. No lymphadenopathy or thryomegaly appreciated. Cor: PMI nondisplaced. Regular rate & rhythm. No rubs, gallops or murmurs. Lungs: clear but decreased Abdomen: soft, nontender, nondistended. No hepatosplenomegaly. No bruits or masses. Good bowel sounds. Extremities: no cyanosis, clubbing, rash, edema Neuro: alert & orientedx3, cranial nerves grossly intact. moves all 4 extremities w/o difficulty. Affect pleasant    Recent Labs: 09/23/2023: Magnesium 1.9 10/08/2023: Hemoglobin 14.0; Platelets 291.0 04/05/2024: B Natriuretic Peptide 778.7; BUN 20; Creatinine, Ser 1.19; Potassium 4.7; Sodium 138 08/04/2024: ALT 17   Lipid Panel     Component Value Date/Time   CHOL 127 11/07/2021 1212   CHOL 101 12/04/2014 1030   TRIG 170 (H) 11/07/2021 1212   HDL 51 11/07/2021 1212   HDL 43 12/04/2014 1030   CHOLHDL 2.5 11/07/2021 1212   VLDL 34 11/07/2021 1212   LDLCALC 42 11/07/2021 1212   LDLCALC 39 12/04/2014 1030     ASSESSMENT AND PLAN:  1. Chronic systolic due to ischemic CM  - Echo 03/2016 LVEF 20-25%, Grade 2 DD - Echo 3/18 shows with stable LVEF 20-25%. Normal RV.  - Echo 9/18 EF 20-25%, grade 2 DD  - CPX 2018 pVO2 19.6 (69%) slope 37 RER 1.06 FEV1 (68%)  - CPX 7/24: pVO2: 15.0 (57%) slope 44 RER 1.12 - Echo 05/16/20  EF 25% RV normal. - Echo 1/23 EF 25-30%  - Echo 10/24 EF 25-30% RV mild HK - Stable NYHA II-III. Symptoms due to mix of HD and ILD/COPD - Volume status stable on lasix  20 mg PRN - Continue Entresto  24/26. Up-titration Limited by low BP. If BP drops < 90 -> drop Entresto  TO 12/13 BID - Continue Toprol  25 - Continue sprio 12.5 mg daily (dose cut back due to hyperkalemia) .   - Off Jardiance  with yeast infection. Will try again today  - ICD interrogated personally. No VT/AF. Volume ok  - Can consider Barostim or CCM as needed  2.  CAD s/p Anterior MI December 2015:  - Cath 8/22 with stable CAD - No s/s angina - Continue DAPT/statin - Lipids per Dr. Loreli.   3.  AF/AFL - seen on zio 5% AF burden - Had AF ablation 08/04/22 remains off amio - No recurrent AF on ICD interrogation - Continue Eliquis . No significant bleeding  4. H/o VT - had SVT/VT in 12/22 - Device reprogrammed. Off amio - As above. No VT on ICD today   5. Tobacco abuse:  - Remains quit  6. Emphysema/ILD - Sees pulmonary. Spirometry on CPX previously reviewed with only mild disease.  - Hi-res CT 5/24 suggestive of ILD but ? Whether related to amio. Amio now off and has been treated with steroids x 4 weeks. Check sed rate. Repeat Hi-res CT - Repeat Hi-rest CT in 9/24 significant ILD  - Follows with Dr. Lorry - Had Hi-res CT today   7. OSA, severe - Obstructive 51/hr - Central 23/hr - Continue CPAP. Followed with Dr. Shlomo Toribio Fuel, MD  4:02 PM

## 2024-08-25 ENCOUNTER — Encounter (HOSPITAL_COMMUNITY): Admitting: Internal Medicine

## 2024-08-30 ENCOUNTER — Ambulatory Visit: Payer: PPO

## 2024-08-30 DIAGNOSIS — I5022 Chronic systolic (congestive) heart failure: Secondary | ICD-10-CM | POA: Diagnosis not present

## 2024-09-01 LAB — CUP PACEART REMOTE DEVICE CHECK
Battery Remaining Longevity: 25 mo
Battery Remaining Percentage: 24 %
Battery Voltage: 2.92 V
Brady Statistic RV Percent Paced: 1 %
Date Time Interrogation Session: 20250924184237
HighPow Impedance: 68 Ohm
HighPow Impedance: 68 Ohm
Implantable Lead Connection Status: 753985
Implantable Lead Implant Date: 20160323
Implantable Lead Location: 753860
Implantable Pulse Generator Implant Date: 20160323
Lead Channel Impedance Value: 350 Ohm
Lead Channel Pacing Threshold Amplitude: 0.75 V
Lead Channel Pacing Threshold Pulse Width: 0.5 ms
Lead Channel Sensing Intrinsic Amplitude: 12 mV
Lead Channel Setting Pacing Amplitude: 2.5 V
Lead Channel Setting Pacing Pulse Width: 0.5 ms
Lead Channel Setting Sensing Sensitivity: 0.5 mV
Pulse Gen Serial Number: 7255600
Zone Setting Status: 755011

## 2024-09-01 NOTE — Progress Notes (Signed)
Remote ICD Transmission.

## 2024-09-02 NOTE — Progress Notes (Signed)
Remote ICD Transmission.

## 2024-09-05 DIAGNOSIS — I130 Hypertensive heart and chronic kidney disease with heart failure and stage 1 through stage 4 chronic kidney disease, or unspecified chronic kidney disease: Secondary | ICD-10-CM | POA: Diagnosis not present

## 2024-09-05 DIAGNOSIS — N1831 Chronic kidney disease, stage 3a: Secondary | ICD-10-CM | POA: Diagnosis not present

## 2024-09-05 DIAGNOSIS — I272 Pulmonary hypertension, unspecified: Secondary | ICD-10-CM | POA: Diagnosis not present

## 2024-09-05 DIAGNOSIS — E785 Hyperlipidemia, unspecified: Secondary | ICD-10-CM | POA: Diagnosis not present

## 2024-09-05 DIAGNOSIS — L282 Other prurigo: Secondary | ICD-10-CM | POA: Diagnosis not present

## 2024-09-05 DIAGNOSIS — G629 Polyneuropathy, unspecified: Secondary | ICD-10-CM | POA: Diagnosis not present

## 2024-09-05 DIAGNOSIS — J432 Centrilobular emphysema: Secondary | ICD-10-CM | POA: Diagnosis not present

## 2024-09-05 DIAGNOSIS — J84112 Idiopathic pulmonary fibrosis: Secondary | ICD-10-CM | POA: Diagnosis not present

## 2024-09-05 DIAGNOSIS — E039 Hypothyroidism, unspecified: Secondary | ICD-10-CM | POA: Diagnosis not present

## 2024-09-05 DIAGNOSIS — I5022 Chronic systolic (congestive) heart failure: Secondary | ICD-10-CM | POA: Diagnosis not present

## 2024-09-05 DIAGNOSIS — J9611 Chronic respiratory failure with hypoxia: Secondary | ICD-10-CM | POA: Diagnosis not present

## 2024-09-05 DIAGNOSIS — I2510 Atherosclerotic heart disease of native coronary artery without angina pectoris: Secondary | ICD-10-CM | POA: Diagnosis not present

## 2024-09-08 DIAGNOSIS — G4733 Obstructive sleep apnea (adult) (pediatric): Secondary | ICD-10-CM | POA: Diagnosis not present

## 2024-09-09 ENCOUNTER — Ambulatory Visit: Payer: Self-pay | Admitting: Cardiology

## 2024-09-10 ENCOUNTER — Other Ambulatory Visit: Payer: Self-pay | Admitting: Internal Medicine

## 2024-09-10 DIAGNOSIS — J84112 Idiopathic pulmonary fibrosis: Secondary | ICD-10-CM

## 2024-09-12 ENCOUNTER — Encounter

## 2024-09-12 ENCOUNTER — Other Ambulatory Visit: Payer: Self-pay | Admitting: Cardiology

## 2024-09-12 ENCOUNTER — Other Ambulatory Visit: Payer: Self-pay

## 2024-09-12 DIAGNOSIS — I4891 Unspecified atrial fibrillation: Secondary | ICD-10-CM

## 2024-09-12 MED ORDER — PIRFENIDONE 267 MG PO TABS
801.0000 mg | ORAL_TABLET | Freq: Three times a day (TID) | ORAL | 2 refills | Status: AC
  Filled 2024-09-12 – 2024-09-15 (×2): qty 270, 30d supply, fill #0
  Filled 2024-10-12: qty 270, 30d supply, fill #1
  Filled 2024-11-09: qty 270, 30d supply, fill #2

## 2024-09-12 NOTE — Telephone Encounter (Signed)
 Pt requesting refill of specialty medication - routing to Rx team to advise.

## 2024-09-12 NOTE — Telephone Encounter (Signed)
 Prescription refill request for Eliquis  received. Indication: PAF Last office visit: 08/24/24  D [name] MD Scr: 1.19 on 04/05/24  Epic Age: 73 Weight: 94.4kg  Based on above findings Eliquis  5mg  twice daily is the appropriate dose.  Refill approved.

## 2024-09-12 NOTE — Telephone Encounter (Signed)
 Refill sent for ESBRIET  to [facility]: [phone]   Dose: 801mg  three times daily   Last OV: 05/17/24 Provider: Dr. Geronimo Pertinent labs: LFTs wnl 08/04/24  Next OV: today (research study participant)   Aleck Puls, PharmD, BCPS Clinical Pharmacist  [facility]

## 2024-09-15 ENCOUNTER — Encounter (INDEPENDENT_AMBULATORY_CARE_PROVIDER_SITE_OTHER): Payer: Self-pay

## 2024-09-15 ENCOUNTER — Other Ambulatory Visit: Payer: Self-pay

## 2024-09-15 ENCOUNTER — Other Ambulatory Visit (HOSPITAL_COMMUNITY): Payer: Self-pay

## 2024-09-15 NOTE — Progress Notes (Signed)
 Specialty Pharmacy Refill Coordination Note  MyChart Questionnaire Submission  Kyle Bullock is a 73 y.o. male contacted today regarding refills of specialty medication(s) Pirfenidone .  Doses on hand: (Patient-Rptd) 50   Patient requested: (Patient-Rptd) Delivery   Delivery date: 09/20/24  Verified address: [address] [place] [address]  Medication will be filled on 09/19/24.

## 2024-09-16 ENCOUNTER — Other Ambulatory Visit: Payer: Self-pay

## 2024-09-18 NOTE — Addendum Note (Signed)
 Encounter addended by: Ezriel Boffa R, MD on: 09/18/2024 5:23 PM  Actions taken: Clinical Note Signed, Level of Service modified, Visit diagnoses modified, Charge Capture section accepted

## 2024-09-18 NOTE — Progress Notes (Signed)
 Good news - no pneumonia. No lung cancer. Reest is all as expected. Fibrosis is worse over 5 years but that we knew. Will see you in December wih PFT Xxxxx   IMPRESSION: 1. Pulmonary focal pattern of interstitial lung disease and air trapping, as detailed above, with less of a ground-glass component compared with 08/17/2023 but with progressive fibrosis from remote examination 02/02/2019. Findings may be due to fibrotic hypersensitivity pneumonitis. Findings are suggestive of an alternative diagnosis (not UIP) per consensus guidelines: Diagnosis of Idiopathic Pulmonary Fibrosis: An Official ATS/ERS/JRS/ALAT Clinical Practice Guideline. Am JINNY Honey Crit Care Med Vol 198, Iss 5, [phone], Aug 08 2017. 2. Enlarged pulmonic trunk, indicative of pulmonary arterial hypertension. 3. Aortic atherosclerosis (ICD10-I70.0). Coronary artery calcification. 4.  Emphysema (ICD10-J43.9).     Electronically Signed   By: Newell Eke M.D.   On: 08/29/2024 15:58

## 2024-09-19 ENCOUNTER — Other Ambulatory Visit: Payer: Self-pay

## 2024-09-28 DIAGNOSIS — G4733 Obstructive sleep apnea (adult) (pediatric): Secondary | ICD-10-CM | POA: Diagnosis not present

## 2024-09-28 DIAGNOSIS — J9611 Chronic respiratory failure with hypoxia: Secondary | ICD-10-CM | POA: Diagnosis not present

## 2024-09-28 DIAGNOSIS — J849 Interstitial pulmonary disease, unspecified: Secondary | ICD-10-CM | POA: Diagnosis not present

## 2024-10-03 ENCOUNTER — Encounter: Payer: Self-pay | Admitting: Cardiology

## 2024-10-12 ENCOUNTER — Other Ambulatory Visit: Payer: Self-pay

## 2024-10-14 ENCOUNTER — Other Ambulatory Visit: Payer: Self-pay

## 2024-10-14 ENCOUNTER — Encounter (INDEPENDENT_AMBULATORY_CARE_PROVIDER_SITE_OTHER): Payer: Self-pay

## 2024-10-14 ENCOUNTER — Other Ambulatory Visit: Payer: Self-pay | Admitting: Pharmacy Technician

## 2024-10-14 NOTE — Progress Notes (Signed)
 Specialty Pharmacy Refill Coordination Note  Kyle Bullock is a 73 y.o. male contacted today regarding refills of specialty medication(s) Pirfenidone    Patient requested (Patient-Rptd) Delivery   Delivery date: 10/18/2024 Verified address: (Patient-Rptd) [address] [place], [place], [place] [address]   Medication will be filled on: 10/17/2024

## 2024-10-17 ENCOUNTER — Other Ambulatory Visit: Payer: Self-pay

## 2024-10-19 ENCOUNTER — Telehealth: Payer: Self-pay

## 2024-10-19 NOTE — Telephone Encounter (Signed)
 States he was outside at the [employer] all day in the cold and lifting and moving things during this time. States he recalls palpitations and minor chest pain at the time. Reports chest pain lasted approx. 2-3 minutes. Denies any symptoms today. Complaint with all medications on file.   Patient advised I will forward to Dr. Cindie to advise if anything further is needed at this time. Advised patient would like to get him in to see provider to discuss further. Patient agreeable to plan.  Advised no driving x6 months per [facility], shock plan reviewed. ED precautions given to pt w/ verbal understanding.  Also, Dr. Cindie ~ presenting rhythm, possible AF? Single lead.

## 2024-10-19 NOTE — Telephone Encounter (Signed)
 Spoke to patient and reported he was driving and will call back once he is at a good point. Patient has phone number to call. Was appreciative of call.

## 2024-10-19 NOTE — Telephone Encounter (Signed)
 Alert received from [employer] for  VT/VF occurred, successful ATP 3 VT-2 events 11/11 14:53-15:03, 26-32sec in duration, HR's 184-193, EGM's c/w likely sustained VT all pace terminated with 1 burst of ATP, increase in HR's per trends - Route to triage high alert per protocol.

## 2024-10-20 DIAGNOSIS — L565 Disseminated superficial actinic porokeratosis (DSAP): Secondary | ICD-10-CM | POA: Diagnosis not present

## 2024-10-20 DIAGNOSIS — D1801 Hemangioma of skin and subcutaneous tissue: Secondary | ICD-10-CM | POA: Diagnosis not present

## 2024-10-20 DIAGNOSIS — L814 Other melanin hyperpigmentation: Secondary | ICD-10-CM | POA: Diagnosis not present

## 2024-10-20 DIAGNOSIS — L821 Other seborrheic keratosis: Secondary | ICD-10-CM | POA: Diagnosis not present

## 2024-10-20 DIAGNOSIS — L2989 Other pruritus: Secondary | ICD-10-CM | POA: Diagnosis not present

## 2024-10-20 NOTE — Telephone Encounter (Signed)
 Patient returned RN's call.

## 2024-10-20 NOTE — Telephone Encounter (Signed)
 Attempted to contact Pt.  Left message requesting call back.  Additional alert received:  Alert remote transmission: VT/VF occurred, successful ATP 15 VT-2, 3 VT-1 classified events  11/12 08:22-10:20,  HR's 189-190, EGM's c/w regular tachy rhythm, similar morphology to SVT EGM's and presenting,  all slowed to irregular VS with 1-3 bursts of ATP, increase in HR's per trends

## 2024-10-20 NOTE — Telephone Encounter (Signed)
 Patient called back to clinic   This RN discussed with the patient the rhythm events and ATP treatments delivered by his defibrillator mentioned below  Patient informed of 6 month driving restrictions starting today per [place]CDOT guidelines and patient verbalized understanding of restrictions  Patient scheduled for in clinic follow up next Friday 10/28/24 with Aniceto, NP  Appointment date, time, and location were all correctly read back to this RN by the patient  All questions and concerns addressed during call

## 2024-10-26 ENCOUNTER — Other Ambulatory Visit (HOSPITAL_COMMUNITY): Payer: Self-pay

## 2024-10-26 ENCOUNTER — Other Ambulatory Visit: Payer: Self-pay

## 2024-10-26 NOTE — Progress Notes (Signed)
 Electrophysiology Office Note:   Date:  10/28/2024  ID:  Kyle Bullock, DOB [birth year], MRN [id number]  Primary Cardiologist: None Primary Heart Failure: None Electrophysiologist: OLE ONEIDA HOLTS, MD      History of Present Illness:   Kyle Bullock is a 73 y.o. male with h/o AF, AFL, VT / HFrEF s/p ICD, HTN seen today for routine electrophysiology followup.   Device alert received 10/19/24 in Device Clinic for VT/VF s/p successful ATP > 15 VT-2, 3 VT-1 classified events.  HR's 180-190 bpm, ATP during events 1-3 times during each event. There was some irregularity to EGM.  Driving restrictions reviewed for 6 months. He recalled palpitations and minor chest discomfort.  He thought it was because he had been working out in the cold so much that week. He reports he was not taking his lasix  for approx 5-10 days.   Since last being seen in our clinic the patient reports he has started taking his lasix  again. He had not been taking it the week he had the episodes of VT due to being so busy.    He denies chest pain, palpitations, dyspnea, PND, orthopnea, nausea, vomiting, dizziness, syncope, edema, weight gain, or early satiety.   Review of systems complete and found to be negative unless listed in HPI.    EP Information / Studies Reviewed:    EKG is ordered today. Personal review as below.  EKG Interpretation Date/Time:  Friday October 28 2024 08:21:52 EST Ventricular Rate:  56 PR Interval:  206 QRS Duration:  136 QT Interval:  472 QTC Calculation: 455 R Axis:   153  Text Interpretation: Suspect arm lead reversal, interpretation assumes no reversal Sinus bradycardia Right bundle branch block Confirmed by Aniceto Jarvis ([id number]) on 10/28/2024 8:29:24 AM   ICD Interrogation-  reviewed in detail today,  See PACEART report.  Device History: Abbott Single Chamber ICD implanted 2016 for ICM / HFrEF  History of appropriate therapy: Yes History of AAD therapy: No    Arrhythmia  / AAD / Pertinent EP Studies AF  EPS 07/2022 > s/p PVI & PW ablation  AFL  EPS 08/2020 > s/p CTI ablation  Risk Assessment/Calculations:    CHA2DS2-VASc Score = 4   This indicates a 4.8% annual risk of stroke. The patient's score is based upon: CHF History: 1 HTN History: 1 Diabetes History: 0 Stroke History: 0 Vascular Disease History: 1 Age Score: 1 Gender Score: 0        STOP-Bang Score:  5       Physical Exam:   VS:  BP 96/64 (BP Location: Right Arm, Patient Position: Sitting, Cuff Size: Large)   Pulse (!) 56   Ht 6' 3 (1.905 m)   Wt 209 lb 6.4 oz (95 kg)   SpO2 98%   BMI 26.17 kg/m    Wt Readings from Last 3 Encounters:  10/28/24 209 lb 6.4 oz (95 kg)  08/24/24 208 lb 3.2 oz (94.4 kg)  07/27/24 208 lb 3.2 oz (94.4 kg)     GEN: Well nourished, well developed in no acute distress NECK: No JVD; No carotid bruits CARDIAC: Regular rate and rhythm, no murmurs, rubs, gallops RESPIRATORY:  Clear to auscultation without rales, wheezing or rhonchi  ABDOMEN: Soft, non-tender, non-distended EXTREMITIES:  No edema; No deformity   ASSESSMENT AND PLAN:    Chronic Systolic Dysfunction due to ICM s/p Abbott single chamber ICD  Hx VT/VF Arrest  LVEF 09/2023 25-30% -euvolemic today -On an appropriate medical regimen >  no room to increase BB  -pt encouraged to take lasix  / meds as prescribed  -Multiple episodes of VT in VT-1, VT2 zone with successful ATP > ATP x1 in some instances up to ATPx3 in an episode -hold additional AAD for now, if further VT consider mexiletine at f/u visit  -Normal ICD function -See Pace Art report -No changes today -update labs > BMP, Mg+ -discussed no driving for 6 months from 10/19/24 -consider ECHO at next visit in 3 months   Atrial Fibrillation  CHA2DS2-VASc 4 -OAC for stroke prophylaxis  -no recurrent burden since ablation    -continue Toprol  25 mg daily > no room in BP to increase  Secondary Hypercoagulable State  -continue Eliquis   5mg  BID, dose reviewed and appropriate by age / wt  CAD s/p LAD DES  In [place], [place] -no anginal symptoms     Disposition:   Follow up with EP APP in 3 months > transition to new EP MD   Signed, Daphne Barrack, NP-C, AGACNP-BC [facility] - Electrophysiology  10/28/2024, 3:24 PM

## 2024-10-26 NOTE — Progress Notes (Signed)
 Specialty Pharmacy Ongoing Clinical Assessment Note  Kyle Bullock is a 73 y.o. male who is being followed by the specialty pharmacy service for RxSp Interstitial Lung Disease   Patient's specialty medication(s) reviewed today: Pirfenidone    Missed doses in the last 4 weeks: 0   Patient/Caregiver did not have any additional questions or concerns.   Therapeutic benefit summary: Patient is achieving benefit   Adverse events/side effects summary: Experienced adverse events/side effects (occasional diarrhea and itching, but able to control with OTC meds)   Patient's therapy is appropriate to: Continue    Goals Addressed             This Visit's Progress    Maintain optimal adherence to therapy   On track    Patient is on track. Patient will maintain adherence and be monitored by provider to determine if a change in treatment plan is warranted      Minimize and address adverse drug events/drug interactions   On track    Patient is on track. Patient will adhere to provider and/or lab appointments and be monitored by provider to determine if a change in treatment plan is warranted         Follow up: 12 months  [facility] Specialty Pharmacist

## 2024-10-28 ENCOUNTER — Encounter: Payer: Self-pay | Admitting: Pulmonary Disease

## 2024-10-28 ENCOUNTER — Ambulatory Visit: Attending: Pulmonary Disease | Admitting: Pulmonary Disease

## 2024-10-28 VITALS — BP 96/64 | HR 56 | Ht 75.0 in | Wt 209.4 lb

## 2024-10-28 DIAGNOSIS — I472 Ventricular tachycardia, unspecified: Secondary | ICD-10-CM | POA: Diagnosis not present

## 2024-10-28 DIAGNOSIS — D6869 Other thrombophilia: Secondary | ICD-10-CM

## 2024-10-28 DIAGNOSIS — I5022 Chronic systolic (congestive) heart failure: Secondary | ICD-10-CM | POA: Diagnosis not present

## 2024-10-28 DIAGNOSIS — I4891 Unspecified atrial fibrillation: Secondary | ICD-10-CM | POA: Diagnosis not present

## 2024-10-28 LAB — CUP PACEART INCLINIC DEVICE CHECK
Date Time Interrogation Session: 20251121163107
Implantable Lead Connection Status: 753985
Implantable Lead Implant Date: 20160323
Implantable Lead Location: 753860
Implantable Pulse Generator Implant Date: 20160323
Pulse Gen Serial Number: 7255600

## 2024-10-28 NOTE — Patient Instructions (Signed)
 Medication Instructions:  No medications changes today   *If you need a refill on your cardiac medications before your next appointment, please call your pharmacy*  Lab Work: BMP, Mg to assess electrolytes If you have labs (blood work) drawn today and your tests are completely normal, you will receive your results only by: MyChart Message (if you have MyChart) OR A paper copy in the mail If you have any lab test that is abnormal or we need to change your treatment, we will call you to review the results.  Testing/Procedures: No testing/procedures were scheduled today  Follow-Up: At [facility], you and your health needs are our priority.  As part of our continuing mission to provide you with exceptional heart care, our providers are all part of one team.  This team includes your primary Cardiologist (physician) and Advanced Practice Providers or APPs (Physician Assistants and Nurse Practitioners) who all work together to provide you with the care you need, when you need it.  Your next appointment:   3 month(s)  Provider:   You may see Dr. Inocencio or one of the following Advanced Practice Providers on your designated Care Team:    Daphne Barrack, NP    We recommend signing up for the patient portal called MyChart.  Sign up information is provided on this After Visit Summary.  MyChart is used to connect with patients for Virtual Visits (Telemedicine).  Patients are able to view lab/test results, encounter notes, upcoming appointments, etc.  Non-urgent messages can be sent to your provider as well.   To learn more about what you can do with MyChart, go to [email or web address].

## 2024-10-29 LAB — BASIC METABOLIC PANEL WITH GFR
BUN/Creatinine Ratio: 14 (ref 10–24)
BUN: 15 mg/dL (ref 8–27)
CO2: 25 mmol/L (ref 20–29)
Calcium: 9.6 mg/dL (ref 8.6–10.2)
Chloride: 102 mmol/L (ref 96–106)
Creatinine, Ser: 1.07 mg/dL (ref 0.76–1.27)
Glucose: 87 mg/dL (ref 70–99)
Potassium: 5 mmol/L (ref 3.5–5.2)
Sodium: 140 mmol/L (ref 134–144)
eGFR: 73 mL/min/1.73 (ref >59)

## 2024-10-29 LAB — MAGNESIUM: Magnesium: 2.1 mg/dL (ref 1.6–2.3)

## 2024-10-30 ENCOUNTER — Ambulatory Visit: Payer: Self-pay | Admitting: Cardiology

## 2024-10-31 ENCOUNTER — Other Ambulatory Visit (HOSPITAL_COMMUNITY): Payer: Self-pay | Admitting: Internal Medicine

## 2024-11-01 ENCOUNTER — Telehealth: Payer: Self-pay

## 2024-11-01 NOTE — Telephone Encounter (Signed)
 Alert remote transmission:  VT/VF episode occurred, successful ATTP delivered Event occurred 11/24 @ 16:21, duration 24sec, HR 196, falling into the VT-2 zone, ATP delivered x1 slowing to regular VS HR's have returned to baseline per trends   LM with wife for patient to call back to assess symptoms, meds, etc.  Patient just seen on 11/21 by [facility] for frequent VT with ATP, plans to start Mexiletine at next OV follow up if continued events. See note.

## 2024-11-01 NOTE — Telephone Encounter (Signed)
 Spoke with patient.  Denies any symptoms during event yesterday, was working out in yard yesterday. Taking all medications, no change in diet or health.   Reminded of driving restrictions.   Will forward to Daphne Barrack, NP as an RICK. (Last seen on 10/28/24).  Let patient know we will continue to monitor his burden of events. Plan to start mexiletine as a next step if burden increases.

## 2024-11-02 ENCOUNTER — Telehealth: Payer: Self-pay | Admitting: Pulmonary Disease

## 2024-11-02 ENCOUNTER — Other Ambulatory Visit (HOSPITAL_COMMUNITY): Payer: Self-pay

## 2024-11-02 DIAGNOSIS — I5022 Chronic systolic (congestive) heart failure: Secondary | ICD-10-CM

## 2024-11-02 DIAGNOSIS — I472 Ventricular tachycardia, unspecified: Secondary | ICD-10-CM

## 2024-11-02 MED ORDER — MEXILETINE HCL 200 MG PO CAPS
200.0000 mg | ORAL_CAPSULE | Freq: Two times a day (BID) | ORAL | 6 refills | Status: AC
  Filled 2024-11-02: qty 60, 30d supply, fill #0

## 2024-11-02 NOTE — Telephone Encounter (Signed)
 See phone note from 11/01/24 regarding VT event > reviewed, event on 11/24 VT in VT-2 zone receiving ATP x1 that returned patient back to baseline VS.   Plan:  -update ECHO  -begin mexiletine 200 mg BID     Daphne Barrack, NP-C, AGACNP-BC [facility] - Electrophysiology  11/02/2024, 3:31 PM

## 2024-11-07 ENCOUNTER — Other Ambulatory Visit (HOSPITAL_COMMUNITY): Payer: Self-pay | Admitting: Internal Medicine

## 2024-11-07 ENCOUNTER — Other Ambulatory Visit (HOSPITAL_COMMUNITY): Payer: Self-pay

## 2024-11-07 DIAGNOSIS — I5022 Chronic systolic (congestive) heart failure: Secondary | ICD-10-CM

## 2024-11-07 DIAGNOSIS — I10 Essential (primary) hypertension: Secondary | ICD-10-CM

## 2024-11-08 DIAGNOSIS — G4733 Obstructive sleep apnea (adult) (pediatric): Secondary | ICD-10-CM | POA: Diagnosis not present

## 2024-11-09 ENCOUNTER — Other Ambulatory Visit: Payer: Self-pay

## 2024-11-09 NOTE — Telephone Encounter (Signed)
 Spoke with patient.  He is aware of the mexiletine and attempted to start it on Monday. Diarrhea on Monday and Tuesday - but he is also fighting a respiratory infection and on other medications that my be impacting.   He is going to hold off on starting the mexiletine until after his acute illness resolved, if still having GI issues, he will call us  back and let us  know so we can consider other options.   Also, made patient aware that someone will be calling him about setting up the Echo.    Patient verbalizes understanding.

## 2024-11-09 NOTE — Telephone Encounter (Signed)
 LM for patient to call and discuss new start on Mexiletine and scheduling of his Echo.

## 2024-11-10 NOTE — Telephone Encounter (Signed)
 Reviewed the patient's chart. His echocardiogram is scheduled for 12/16/24.

## 2024-11-11 ENCOUNTER — Other Ambulatory Visit: Payer: Self-pay

## 2024-11-11 ENCOUNTER — Encounter (INDEPENDENT_AMBULATORY_CARE_PROVIDER_SITE_OTHER): Payer: Self-pay

## 2024-11-11 ENCOUNTER — Other Ambulatory Visit (HOSPITAL_COMMUNITY): Payer: Self-pay

## 2024-11-11 NOTE — Progress Notes (Signed)
 Specialty Pharmacy Refill Coordination Note  Kyle Bullock is a 73 y.o. male contacted today regarding refills of specialty medication(s) Pirfenidone    Patient requested Delivery   Delivery date: 11/29/24   Verified address: [address] [name], [place] [address]   Medication will be filled on: 11/28/24

## 2024-11-13 NOTE — Progress Notes (Unsigned)
 Synopsis: Referred in 2016 for evaluation of COPD. Has ischemic cardiomyopathy after a large MI. LVEF 25-30% as per echocardiogram in March 2016. December 2015 CT angiogram chest: No pulmonary embolism, mild centrilobular and paraseptal emphysema and upper lobe distribution bilaterally, small pleural effusion on the left, there is dependent groundglass and mild interlobular septal thickening bilaterally Spirometry as part of June 2016 cardiopulmonary exercise test: Ratio 73%, FEV1 2.4 L (61% predicted), FVC 3.27 L (66% predicted). 06/2015 CT chest HRCT> very mild patches of ground glass in the bases of both lungs with non-specific interstitial changes, no clear fibrotic change; scattered pulmonary nodules appear stable compared to 11/2014, emphysema and bronchial wall thickening noted July 2016 pulmonary function testing: Ratio 73% predicted, FEV1 2.42 L (59% predicted) FVC 3.33 L (61% predicted), total lung capacity 5.74 L (71% predicted), DLCO 15.23 (38% predicted), Hgb 13.5  = COPD GOLD 0  [place] imp/plan  06/25/15  Centrilobular emphysema  CT scan from July 2016. This showed mild centrilobular emphysema bilaterally. There was a question of some very mild nonspecific interstitial changes in the periphery of the right lung and some scant dependent changes in the left lung.  >>>follow  with at least one repeat CT scan in one year with a repeat lung function test.  Multiple pulmonary nodules He had a few scattered small pulmonary nodules. These have been stable on 2 separate images. The radiologist has recommended a repeat CT chest in one year.    Toward end of April 2024 nasal congestion/ muscle aches/sorethroat bad cough clear mucus rx  doxy/omnicef but really didn't turn the corner until prednisone  rx    05/08/2023  New Pt eval/ Pulmonary ov /Wert re: COPD 0/ emphysema on and off amiodarone  since 05/2020  maint on Trelegy since Feb 2024 from anoro while also back  on amiodarone  daily  since 08/04/22  Chief Complaint  Patient presents with   Consult    SOB with exertion x 6 weeks.  Review CT scan 05/01/2023. Concern with taking amiodarone .   Dyspnea:  was walking 2.5 miles per day and stopped and now back to mile and a half and finished prednisone  on 05/07/23 p 6 day Cough: loose clear  Sleeping: bed is flat/ one pillow  SABA use: none  02: none    No obvious day to day or daytime variability or assoc excess/ purulent sputum or mucus plugs or hemoptysis or cp or chest tightness, subjective wheeze or overt sinus or hb symptoms.   Sleeping  without nocturnal  or early am exacerbation  of respiratory  c/o's or need for noct saba. Also denies any obvious fluctuation of symptoms with weather or environmental changes or other aggravating or alleviating factors except as outlined above   No unusual exposure hx or h/o childhood pna/ asthma or knowledge of premature birth.         OV 10/08/2023 -transfer of care from Dr. Ozell America to Dr. Geronimo in the ILD center.  Referred by Dr. Toribio Fuel and Dr. Vicenta Gentry primary care.  Subjective:  Patient ID: Kyle Bullock, male , DOB: [birth year] , age 73 y.o. , MRN: [id number] , ADDRESS: [address] [place] [address] PCP Gentry Elsie JONETTA Mickey., MD Patient Care Team: Gentry Elsie JONETTA Mickey., MD as PCP - General (Internal Medicine) Cindie Ole DASEN, MD as PCP - Electrophysiology (Cardiology)  This Provider for this visit: Treatment Team:  Attending Provider: Geronimo Amel, MD    10/08/2023 -  Chief Complaint  Patient presents with   Follow-up    Pt is here for F/U visit. Pt had PFT done 10-08-2023.     HPI Kyle Bullock 73 y.o. -history is provided by the patient, his wife and also review of the external medical record.  He is retired.  He is a wine salesman.  Now worked in [employer].  He is known to have ischemic cardiomyopathy and has a defibrillator.  He sees Dr. Ole Holts for atrial  fibrillation.  He sees Dr. Daniel [name] for chronic systolic heart failure all his problems started in December 2015 when he suffered VF arrest and status post STEMI with drug-eluting stent to the LAD December 2015.  This was in [place] .  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  He has been a patient of Dr. Toribio Fuel per chart review since August 2022.  As of 2022 it was observed he had emphysema and no ILD.  He had seen Dr. Vicenta Lennert in this pulmonary clinic.  His previous history of amiodarone  was between June 2021 and October 2021.  Most recently he says that he status post ablation in the fall 2023.  Review of the records confirm this happen for atrial fibrillation on 08/04/2022.  He was then placed on amiodarone .  Then he said getting dyspneic in May 2024 he did have a high-resolution CT chest that showed ILD.  He saw Dr. Ozell America.  Subsequently placed on prednisone  for 6 weeks by Dr. Holts.  After the prednisone  and discontinuation of the amiodarone  he started feeling better but he still not back to his summer 2023 baseline although he is better compared to April 2023.  And his most recent visit with Dr. Toribio Fuel he said he was feeling better and walking to 20 miles per day 3 to 4 days/week.  In July 2024 he did have a cardiopulmonary stress test that showed a VO2 max of 15 mL/kg/min.  Unclear if desaturation test was done.  [In 2018 he had done 19 mL/kg/min] in addition in this 1 there was heavy dead space ventilation.  Review of his imaging shows that in my personal visualization and impression in 2018 all he had was emphysema in 2019 some ILD started showing up in terms of groundglass opacities by 2021 I think this reticulation and by 2022 there is definite ILD.  His most recent 2 scans of May 2024 and September 2024.  Alternative pattern is described by the radiologist.  I personally visualized it and agree with that.  So lower probably T4 UIP/IPF but he does  have associated emphysema.  For his emphysema he is on Trelegy  Current symptom scores as below   And walking desaturation test today he desaturated pretty fast he needed 4 L to correct.  We are discharging him on portable oxygen.   ILD symptom another questionnaire   Past medical history ILD relevant - History of amiodarone  therapy present - Did have COVID in 2022  Family history of lung disease - Mother had COPD - Brother had asthma  Personal exposure history - Smoked heavily between 1 and 2015 1 pack/day. - No marijuana no cocaine no intravenous drug use  Personal exposure history - [place] age of the current home is 46 years.  He is lived there for the 30 years.  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  He used to distribute wine from wholesaler to retailer but he was now exposed to be needed by the grape leaves can have mold  Pulmonary toxicity history - History of amiodarone  therapy twice.  Otherwise negative.        Narrative & Impression  CLINICAL DATA:  73 year old male with history of interstitial lung disease.   EXAM: CT CHEST WITHOUT CONTRAST   TECHNIQUE: Multidetector CT imaging of the chest was performed following the standard protocol without intravenous contrast. High resolution imaging of the lungs, as well as inspiratory and expiratory imaging, was performed.   RADIATION DOSE REDUCTION: This exam was performed according to the departmental dose-optimization program which includes automated exposure control, adjustment of the mA and/or kV according to patient size and/or use of iterative reconstruction technique.   COMPARISON:  High-resolution chest CT 05/01/2023.   FINDINGS: Cardiovascular: Heart size is borderline enlarged. There is no significant pericardial fluid, thickening or  pericardial calcification. There is aortic atherosclerosis, as well as atherosclerosis of the great vessels of the mediastinum and the coronary arteries, including calcified atherosclerotic plaque in the left main, left anterior descending, left circumflex and right coronary arteries. Left-sided pacemaker/AICD with lead tip terminating in the right ventricle.   Mediastinum/Nodes: No pathologically enlarged mediastinal or hilar lymph nodes. Please note that accurate exclusion of hilar adenopathy is limited on noncontrast CT scans. Esophagus is unremarkable in appearance. No axillary lymphadenopathy.   Lungs/Pleura: High-resolution images again demonstrate widespread but patchy areas of ground-glass attenuation, septal thickening, subpleural reticulation, cylindrical traction bronchiectasis, peripheral bronchiolectasis and some upper lung predominant honeycombing which appears minimally progressive compared to the prior examination. Inspiratory and expiratory imaging demonstrates some air trapping indicative of small airways disease. No acute consolidative airspace disease. No pleural effusions. Calcified granuloma in the periphery of the right lower lobe. A few scattered small pulmonary nodules are noted, largest of which is a sessile appearing pleural-based nodule in the posterior right upper lobe (axial image 37 of series 6) measuring 1.4 x 0.6 cm, stable. Other smaller pulmonary nodules are stable. No other larger more suspicious appearing pulmonary nodules or masses are noted. Diffuse bronchial wall thickening with mild to moderate centrilobular and paraseptal emphysema.   Upper Abdomen: Aortic atherosclerosis.   Musculoskeletal: There are no aggressive appearing lytic or blastic lesions noted in the visualized portions of the skeleton.   IMPRESSION: 1. Fibrotic changes in the lungs once again considered most compatible with an alternative diagnosis (not usual  interstitial pneumonia) per current ATS guidelines. Overall, given the presence of air trapping and upper lung predominant honeycombing, findings are once again favored to reflect chronic hypersensitivity pneumonitis. 2. Mild diffuse bronchial wall thickening with mild to moderate centrilobular and paraseptal emphysema; imaging findings suggestive of COPD. 3. Stable pulmonary nodules, likely benign. Continued attention at time of routine annual low-dose lung cancer screening chest CTs is recommended. 4. Aortic atherosclerosis, in addition to left main and three-vessel coronary artery disease. Assessment for potential risk factor modification, dietary therapy or pharmacologic therapy may be warranted, if clinically indicated.   Aortic Atherosclerosis (ICD10-I70.0) and Emphysema (ICD10-J43.9).     Electronically Signed   By: Toribio Aye M.D.   On: 08/27/2023 11:25       OV 12/15/2023  Subjective:  Patient ID: Kyle Bullock, male , DOB: [birth year] , age 25 y.o. , MRN: [id number] , ADDRESS: [address] [place] [address] PCP Loreli Elsie JONETTA Mickey., MD Patient Care Team: Loreli Elsie JONETTA Mickey., MD as  PCP - General (Internal Medicine) Cindie Ole DASEN, MD as PCP - Electrophysiology (Cardiology)  This Provider for this visit: Treatment Team:  Attending Provider: Geronimo Amel, MD    12/15/2023 -   Chief Complaint  Patient presents with   Follow-up    Pt states he has been good. Denies any concerns      HPI AMARE BAIL 73 y.o. -presents with his wife.  His ILD workup in progress.  Known chronic systolic heart failure since cardiac arrest in 2015.  Former smoker with known emphysema.   She is an independent shistorian.  She is a retired engineer, civil (consulting).  At the last visit was the first intake consult for ILD.  Towards the end of the visit I was surprised that he had exercise hypoxemia and this required 4 L for correction.  Therefore recommendation for using  portable oxygen was made.  He he and his wife expressed dissatisfaction and the wait was communicated.  Apologized for that and took accountability.  Since then he had overnight pulse oximetry study.  This was abnormal.  Recommended nighttime oxygen but he has declined.  He did have a sleep study done on 12/13/2023.  He was not aware of the results to live mentioned to him.  This show she has severe sleep apnea AHI 50.5 obstructive and moderate central sleep apnea 22.9/h along with oxygen desaturations less than 88% for 41 minutes.  Otherwise in the interim he reports no change in his health status no admissions to the hospital no ER visits no urgent care visits.  He is here to discuss his workup so far.  He is hypersensitive pneumonitis panel autoimmune panel is all negative.  We discussed in case conference.  That the thinking was that in the upper lobes he had smoking-related interstitial fibrosis and in the lower lobes it was more consistent with probable UIP therefore raising the possibility of IPF.  He definitely has associated emphysema.  I again visualized the CT scans with him.  There was definitely some early ILD in 2016 but it is definitely progressed.   Overall explained to him that we he does have -Associated emphysema with pulmonary fibrosis -The pulmonary fibrosis is progressive -There is lack of clarity between radiologist as to the definitive pattern of pulmonary fibrosis which then suggest that he meets indication for lung biopsy but given his heart failure I would think the risk of lung biopsy is significant -Will give him a clinical diagnosis of IPF based on age, male gender, former smoking history, associate emphysema, progression and conference suggesting probable UIP in the bases. -Explained that if alternative etiologies such as amiodarone  or high percent pneumonitis are consideration then steroids would be indicated.  He did recall that he did do a course of steroids with his of  amiodarone .  He did not like the side effects and it did not help him.  -Explained that antifibrotic's are indicated.  Went over pirfenidone  and nintedanib.  Given his previous cardiac history and the fact he is on anticoagulation do not recommend nintedanib other than being a second line.  Recommend pirfenidone .  Went over details of the drug and the insurance approval process.  He understood   -Also explained the rationale for oxygen.  Particularly with improvement in quality of life.  At this point in time he is not keen on portable oxygen.  However he is agreed to follow-up with the sleep specialist      OV 03/01/2024  Subjective:  Patient ID: Kyle  JONETTA Bullock, male , DOB: [birth year] , age 49 y.o. , MRN: [id number] , ADDRESS: [address] [place] [address] PCP Loreli Elsie JONETTA Mickey., MD Patient Care Team: Loreli Elsie JONETTA Mickey., MD as PCP - General (Internal Medicine) Cindie Ole DASEN, MD as PCP - Electrophysiology (Cardiology)  This Provider for this visit: Treatment Team:  Attending Provider: Geronimo Amel, MD    03/01/2024 -   Chief Complaint  Patient presents with   Follow-up    Breathing has improved some. He is max dose esbriet - has been having diarrhea but his appetite ok and no vomiting.      HPI    OV 05/17/2024  Subjective:  Patient ID: Kyle Bullock, male , DOB: December 24, 1950 , age 89 y.o. , MRN: [id number] , ADDRESS: [address] [place] [address] PCP Loreli Elsie JONETTA Mickey., MD Patient Care Team: Loreli Elsie JONETTA Mickey., MD as PCP - General (Internal Medicine) Cindie Ole DASEN, MD as PCP - Electrophysiology (Cardiology)  This Provider for this visit: Treatment Team:  Attending Provider: Geronimo Amel, MD    05/17/2024 -   Chief Complaint  Patient presents with   Follow-up    Follow up for ILD , no changes in breathing      HPI Kyle Bullock 73 y.o. -    Kyle Bullock 73 y.o. -presents with his wife.  Issues at this  visit  #Symptoms: These are slightly better according to him after he started pirfenidone .  And since the last visit it is stable.  #Pirfenidone  uptake he was having some diarrhea but after taking CAROB floor this is now resolved.   #Therapeutic monitoring because of high risk medication -pirfenidone  requiring intensive therapeutic monitoring: He is not having any nausea vomiting or diminished appetite or weight loss which are classic side effects.  Not having headache and fatigue.  Instead he is having increased diarrhea compared to baseline [see symptom score below].  It happens 3-4 times a week particularly after lunch.  He does take some fiber early in the morning but does not drink coffee other than the morning.  He has not had incontinence but he is able to go to the bathroom it is loose and foul-smelling but there is no tenesmus.  No bloody diarrhea.  He has not tried Imodium which is adequate.  Also recommended natural product CArob  #Emphysema: Stable on Trelegy  # IPF: Currently clinically stable.  He is on the registry program.  He took a pulmonary function test and is stable compared to October 2024  #Nocturnal hypoxemia and sleep apnea: Being addressed by Dr. Randine Bihari   #Lung cancer screening: He was getting low-dose CT scan screening with Dr. Vicenta Loreli his primary care physician.  Now he will have high-resolution CT chest.  Next 1 due September 2025/October 2025.  #New issues rash: Within sun exposed areas in the shin when he wear shorts.  Started walking out a lot as the weather has gotten better.  He is also having some rash and itchiness in the back of his neck.  He only now he started wearing a widebrimmed hat.  The SPF sunscreen that he has placed with the back of his neck is 30 and not 50.        OV 11/13/2024  Subjective:  Patient ID: Kyle Bullock, male , DOB: 15-Oct-1951 , age 14 y.o. , MRN: [id number] , ADDRESS: [address] [place] [address] PCP  Loreli Elsie JONETTA  Mickey., MD Patient Care Team: Loreli Elsie JONETTA Mickey., MD as PCP - General (Internal Medicine) Cindie Ole DASEN, MD as PCP - Electrophysiology (Cardiology)  This Provider for this visit: Treatment Team:  Attending Provider: Geronimo Amel, MD   IPF diagnosed January 2025 and started on pirfenidone  Known chronic systolic heart failure since cardiac arrest in 2015.   Former smoker with known emphysema. Sleep apnea severe obstructive and moderate central early 2025 diagnosed by Dr. Randine Bihari Exertional hypoxemia nocturnal hypoxemia due to all of the above.   Esbriet /Pirfenidone  requires intensive drug monitoring due to high concerns for Adverse effects of , including  Drug Induced Liver Injury, significant GI side effects that include but not limited to Diarrhea, Nausea, Vomiting,  and other system side effects that include Fatigue, headaches, weight loss and other side effects such as skin rash. These will be monitored with  blood work such as LFT initially once a month for 6 months and then quarterly  11/13/2024 -  No chief complaint on file.    HPI CORAL SOLER 74 y.o. -    SYMPTOM SCALE - ILD 10/08/2023 12/15/2023 IPF new dx 03/01/2024 Esbriet  full does  Current weight     O2 use ra ra ra  Shortness of Breath 0 -> 5 scale with 5 being worst (score 6 If unable to do)    At rest 1 0 0  Simple tasks - showers, clothes change, eating, shaving 1 0 2  Household (dishes, doing bed, laundry) 2 2 2   Shopping 2 1 1   Walking level at own pace 2 1 2   Walking up Stairs 2 2 2   Total (30-36) Dyspnea Score 10 6 9   How bad is your cough? 0 1 2  How bad is your fatigue 1 1 2   How bad is nausea 0 0 0  How bad is vomiting?  0 0 0  How bad is diarrhea? 0 1 3 wprse  How bad is anxiety? 1 1 2   How bad is depression 1 1 2   Any chronic pain - if so where and how bad 1    0 2    Simple office walk 224 (66+46 x 2) feet Pod A at [facility] x  3 laps goal with forehead probe  10/08/2023  10/08/2023   O2 used ra ra  Number laps completed Sit stand x 4 time Half laps  Comments about pace Good pac   Resting Pulse Ox/HR 98% and 72/min 97%   Final Pulse Ox/HR 83% and 98/min 85% in just half la  Desaturated </= 88% TES   Desaturated <= 3% points no   Got Tachycardic >/= 90/min yes   Symptoms at end of test duyspnea Needed 4L [place] to correct  to wal 2 LAps  Miscellaneous comments x    CT Chest data from date: ****  - personally visualized and independently interpreted : *** - my findings are: ***   PFT     Latest Ref Rng & Units 03/24/2024   11:26 AM 10/08/2023   10:48 AM 06/22/2018    1:52 PM 06/14/2015   10:58 AM  PFT Results  FVC-Pre L 3.17  3.15  3.55  3.33   FVC-Predicted Pre % 61  60  66  61   FVC-Post L  3.18  3.58    FVC-Predicted Post %  61  66    Pre FEV1/FVC % % 77  77  72  73   Post FEV1/FCV % %  79  68    FEV1-Pre L 2.46  2.43  2.55  2.42   FEV1-Predicted Pre % 65  64  64  59   FEV1-Post L  2.52  2.42    DLCO uncorrected ml/min/mmHg 11.76  11.94  16.18  15.23   DLCO UNC% % 40  40  41  38   DLCO corrected ml/min/mmHg  11.94     DLCO COR %Predicted %  40     DLVA Predicted % 69  72  64  61   TLC L  4.21  5.55  5.74   TLC % Predicted %  52  69  71   RV % Predicted %  43  71  90        LAB RESULTS last 96 hours No results found.       has a past medical history of Abnormal TSH, AICD (automatic cardioverter/defibrillator) present (02/28/2015), CAD (coronary artery disease), CHF (congestive heart failure) (HCC), Dressler's syndrome (HCC), Dyslipidemia, Dyspnea, Erectile dysfunction, Hypertension, Ischemic cardiomyopathy, NSVT (nonsustained ventricular tachycardia) (HCC) (11/16/14), Pericardial effusion, Pericarditis (dx'd 11/2014), Pneumonia (1990's X 2), RBBB, Sleep apnea, STEMI (ST elevation myocardial infarction) (HCC) (11/10/2014), Tobacco abuse, and VF (ventricular fibrillation) (HCC) (11/10/14).   reports that he quit smoking about  10 years ago. His smoking use included cigarettes. He started smoking about 50 years ago. He has a 80 pack-year smoking history. He has never used smokeless tobacco.  Past Surgical History:  Procedure Laterality Date   A-FLUTTER ABLATION N/A 09/04/2020   Procedure: A-FLUTTER ABLATION;  Surgeon: Kelsie Agent, MD;  Location: [facility];  Service: Cardiovascular;  Laterality: N/A;   ATRIAL FIBRILLATION ABLATION N/A 08/04/2022   Procedure: ATRIAL FIBRILLATION ABLATION;  Surgeon: Cindie Ole DASEN, MD;  Location: [facility];  Service: Cardiovascular;  Laterality: N/A;   CARDIAC CATHETERIZATION  11/19/2014   CARDIAC DEFIBRILLATOR PLACEMENT  02/28/2015   COLONOSCOPY WITH PROPOFOL  N/A 03/16/2023   Procedure: COLONOSCOPY WITH PROPOFOL ;  Surgeon: Federico Rosario BROCKS, MD;  Location: [facility];  Service: Gastroenterology;  Laterality: N/A;   CORONARY ANGIOPLASTY WITH STENT PLACEMENT  11/10/2014   LAD DES   HAND SURGERY Bilateral    Dupuytren's Contracture   IMPLANTABLE CARDIOVERTER DEFIBRILLATOR IMPLANT N/A 02/28/2015   SJM Fortify Assura VR ICD implanted by Dr Kelsie.  Revascularized following VF arrest, EF remained depressed and ICD implanted   KNEE ARTHROSCOPY Right 12/08/1980   LEFT HEART CATH N/A 11/19/2014   Procedure: LEFT HEART CATH;  Surgeon: Kyle JINNY Lesches, MD;  Location: [facility] CATH LAB;  Service: Cardiovascular;  Laterality: N/A;   POLYPECTOMY  03/16/2023   Procedure: POLYPECTOMY;  Surgeon: Federico Rosario BROCKS, MD;  Location: [facility];  Service: Gastroenterology;;   RIGHT/LEFT HEART CATH AND CORONARY ANGIOGRAPHY N/A 07/22/2021   Procedure: RIGHT/LEFT HEART CATH AND CORONARY ANGIOGRAPHY;  Surgeon: Cherrie Toribio SAUNDERS, MD;  Location: [facility];  Service: Cardiovascular;  Laterality: N/A;    Allergies  Allergen Reactions   Ambien  [Zolpidem  Tartrate] Anxiety and Other (See Comments)    Causes nightmares   Lisinopril  Cough   Brilinta  [Ticagrelor ] Cough    ? Dyspnea.  Changed to Effient  11/2014.    Immunization History  Administered Date(s) Administered   DTaP, 5 pertussis antigens 12/14/2015   INFLUENZA, HIGH DOSE SEASONAL PF 09/16/2015, 08/30/2017   Influenza, Quadrivalent, Recombinant, Inj, Pf 09/04/2018, 08/27/2019, 09/08/2020, 09/14/2021, 09/26/2023   Influenza-Unspecified 08/17/2014, 09/20/2016   PFIZER(Purple Top)SARS-COV-2 Vaccination 12/28/2019, 01/16/2020   PNEUMOCOCCAL CONJUGATE-20 02/12/2023  Pneumococcal Conjugate-13 07/26/2015   Pneumococcal Polysaccharide-23 11/13/2014, 12/18/2016   Zoster Recombinant(Shingrix) 07/02/2019, 11/10/2019    Family History  Problem Relation Age of Onset   COPD Mother    Hypertension Mother    Heart disease Mother    Hypertension Father    Prostate cancer Father    Crohn's disease Brother    CAD Neg Hx    Colon cancer Neg Hx    Stomach cancer Neg Hx    Esophageal cancer Neg Hx      Current Outpatient Medications:    acetaminophen  (TYLENOL ) 500 MG tablet, Take 500 mg by mouth every 8 (eight) hours as needed for moderate pain., Disp: , Rfl:    atorvastatin  (LIPITOR) 20 MG tablet, TAKE 1 TABLET BY MOUTH DAILY, Disp: 90 tablet, Rfl: 3   cyanocobalamin  (VITAMIN B12) 1000 MCG tablet, Take 1,000 mcg by mouth daily., Disp: , Rfl:    ELIQUIS  5 MG TABS tablet, TAKE 1 TABLET BY MOUTH 2 TIMES A DAY, Disp: 60 tablet, Rfl: 5   empagliflozin  (JARDIANCE ) 10 MG TABS tablet, Take 1 tablet (10 mg total) by mouth daily before breakfast. (Patient not taking: Reported on 10/28/2024), Disp: 30 tablet, Rfl: 11   furosemide  (LASIX ) 20 MG tablet, TAKE 1 TABLET BY MOUTH DAILY AS NEEDED, Disp: 30 tablet, Rfl: 2   levothyroxine (SYNTHROID) 112 MCG tablet, Take 112 mcg by mouth daily before breakfast., Disp: , Rfl:    metoprolol  succinate (TOPROL -XL) 25 MG 24 hr tablet, Take 1 tablet (25 mg total) by mouth at bedtime., Disp: 90 tablet, Rfl: 3   mexiletine (MEXITIL ) 200 MG capsule, Take 1 capsule (200 mg total) by mouth 2 (two)  times daily., Disp: 60 capsule, Rfl: 6   nitroGLYCERIN  (NITROSTAT ) 0.4 MG SL tablet, DISSOLVE 1 TAB UNDER TONGUE FOR CHEST PAIN - IF PAIN REMAINS AFTER 5 MIN, CALL 911 AND REPEAT DOSE. MAX 3 TABS IN 15 MINUTES, Disp: 25 tablet, Rfl: 3   omega-3 acid ethyl esters (LOVAZA) 1 g capsule, Take 2 g by mouth 2 (two) times daily., Disp: , Rfl:    Pirfenidone  267 MG TABS, Take 3 tablets (801 mg total) by mouth 3 (three) times daily with meals., Disp: 270 tablet, Rfl: 2   sacubitril -valsartan  (ENTRESTO ) 24-26 MG, Take 1 tablet by mouth 2 (two) times daily., Disp: 180 tablet, Rfl: 3   spironolactone  (ALDACTONE ) 25 MG tablet, Take 0.5 tablets (12.5 mg total) by mouth daily., Disp: 30 tablet, Rfl: 5   TRELEGY ELLIPTA 100-62.5-25 MCG/ACT AEPB, Take 1 puff by mouth daily., Disp: , Rfl:       Objective:   There were no vitals filed for this visit.  Estimated body mass index is 26.17 kg/m as calculated from the following:   Height as of 10/28/24: 6' 3 (1.905 m).   Weight as of 10/28/24: 209 lb 6.4 oz (95 kg).  @WEIGHTCHANGE @  There were no vitals filed for this visit.   Physical Exam   General: No distress. *** O2 at rest: *** Cane present: *** Sitting in wheel chair: *** Frail: *** Obese: *** Neuro: Alert and Oriented x 3. GCS 15. Speech normal Psych: Pleasant Resp:  Barrel Chest - ***.  Wheeze - ***, Crackles - ***, No overt respiratory distress CVS: Normal heart sounds. Murmurs - *** Ext: Stigmata of Connective Tissue Disease - *** HEENT: Normal upper airway. PEERL +. No post nasal drip        Assessment/     Assessment & Plan IPF (idiopathic pulmonary  fibrosis) (HCC)  ILD (interstitial lung disease) (HCC)    PLAN Patient Instructions     ICD-10-CM   1. IPF (idiopathic pulmonary fibrosis) (HCC)  J84.112     2. Encounter for therapeutic drug monitoring  Z51.81     3. Diarrhea due to drug  K52.1     4. Centrilobular emphysema (HCC)  J43.2     5. Severe sleep apnea   G47.30     6. Exercise hypoxemia  R09.02     7. Screening for lung cancer  Z12.2        # Interstitial lung disease otherwise called pulmonary fibrosis # IPF -diagnosis given in January 2025 [based on the fact age greater than 65, previous smoking history, male gender, Caucasian ethnicity and also associated emphysema)  -Absent in 2018 started in 2019 and progressive.  -Risk factors included amiodarone  2021 and 2023 and COVID 2022  -Clinically stable - cct 2024 -> June 2025 PFT -Tolerating pirfenidone  well   Plan - Continue IPF-PRO registry  -Do spirometry and DLCO in 4 months -Check liver function test today  6/10/2025t - Cotninue ESBRIT but below are holiday rules for rash   #Diarrhea due to drug  - well controlled with CAROB  Plan Continue CAROB Flour OTC as follows Take 1 DESSERT spoon  size serving [approximately 7 g] before breakfast If still no response in 3 days then add another 7 g at dinner If still no response in 3 days then make it to spoon servings at breakfast and 2 spoon servings at dinner and hold  RASH Deu to drug  Plan  - Stop Esbriet  fully for 1 week and then restarted 1 pill 3 times daily for 1 week followed by 2 pills 3 times daily for 1 week and then 3 pills 3 times daily to continue - Wear full-length clothes including widebrimmed hat - Apply Eucerin SPF 50 - Despite the above if rash is still problematic call us  back   #Exercise hypoxemia and new diagnosis of sleep apnea 12/13/2023   -Continue to respect that you do not want to have portable oxygen or nighttime oxygen at this point. -Noted in January 2025 Dr. Randine Bihari is recommended CPAP titration study. -I have counseled the of the benefits of CPAP in the setting of heart failure and sleep apnea.  Plan  --I have sent a message to Dr. Randine Bihari to explain further questions about CPAP titration study and nighttime oxygen use.    #Emphysema  - stable  Plan  - continue trelegy  scheduled with albuteorl as needed  #Lung cancer screening -last CT scan September 2024  Plan  - We will capture information with a high-resolution CT chest for ILD in September 2025   Followup October 2025 after PFT and CT chest; 15-minute visit    FOLLOWUP    No follow-ups on file.    SIGNATURE    Dr. Dorethia Cave, M.D., F.C.C.P,  Pulmonary and Critical Care Medicine Staff Physician, [facility] Director - Interstitial Lung Disease  Program  Pulmonary Fibrosis [facility] [place], [place], [address]  Pager: [phone], If no answer or between  15:00h - 7:00h: call [phone] Telephone: [phone]  9:37 PM 11/13/2024   Moderate Complexity MDM OFFICE  2021 E/M guidelines, first released in 2021, with minor revisions added in 2023 and 2024 Must meet the requirements for 2 out of 3 dimensions to qualify.  Number and complexity of problems addressed Amount and/or complexity of data reviewed Risk of complications and/or morbidity  One or more chronic illness with mild exacerbation, OR progression, OR  side effects of treatment  Two or more stable chronic illnesses  One undiagnosed new problem with uncertain prognosis  One acute illness with systemic symptoms   One Acute complicated injury Must meet the requirements for 1 of 3 of the categories)  Category 1: Tests and documents, historian  Any combination of 3 of the following:  Assessment requiring an independent historian  Review of prior external note(s) from each unique source  Review of results of each unique test  Ordering of each unique test    Category 2: Interpretation of tests   Independent interpretation of a test performed by another physician/other qualified health care professional (not separately reported)  Category 3: Discuss management/tests  Discussion of management or test interpretation with  external physician/other qualified health care professional/appropriate source (not separately reported) Moderate risk of morbidity from additional diagnostic testing or treatment Examples only:  Prescription drug management  Decision regarding minor surgery with identfied patient or procedure risk factors  Decision regarding elective major surgery without identified patient or procedure risk factors  Diagnosis or treatment significantly limited by social determinants of health             HIGh Complexity  OFFICE   2021 E/M guidelines, first released in 2021, with minor revisions added in 2023. Must meet the requirements for 2 out of 3 dimensions to qualify.    Number and complexity of problems addressed Amount and/or complexity of data reviewed Risk of complications and/or morbidity  Severe exacerbation of chronic illness  Acute or chronic illnesses that may pose a threat to life or bodily function, e.g., multiple trauma, acute MI, pulmonary embolus, severe respiratory distress, progressive rheumatoid arthritis, psychiatric illness with potential threat to self or others, peritonitis, acute renal failure, abrupt change in neurological status Must meet the requirements for 2 of 3 of the categories)  Category 1: Tests and documents, historian  Any combination of 3 of the following:  Assessment requiring an independent historian  Review of prior external note(s) from each unique source  Review of results of each unique test  Ordering of each unique test    Category 2: Interpretation of tests    Independent interpretation of a test performed by another physician/other qualified health care professional (not separately reported)  Category 3: Discuss management/tests  Discussion of management or test interpretation with external physician/other qualified health care professional/appropriate source (not separately reported)  HIGH risk of morbidity from additional  diagnostic testing or treatment Examples only:  Drug therapy requiring intensive monitoring for toxicity  Decision for elective major surgery with identified pateint or procedure risk factors  Decision regarding hospitalization or escalation of level of care  Decision for DNR or to de-escalate care   Parenteral controlled  substances            LEGEND - Independent interpretation involves the interpretation of a test for which there is a CPT code, and an interpretation or report is customary. When a review and interpretation of a test is performed and documented by the provider, but not separately reported (billed), then this would represent an independent interpretation. This report does not need to conform to the usual standards of a complete report of the test. This does not include interpretation of tests that do not have formal reports such as a complete blood count with  differential and blood cultures. Examples would include reviewing a chest radiograph and documenting in the medical record an interpretation, but not separately reporting (billing) the interpretation of the chest radiograph.   An appropriate source includes professionals who are not health care professionals but may be involved in the management of the patient, such as a clinical research associate, upper officer, case manager or teacher, and does not include discussion with family or informal caregivers.    - SDOH: SDOH are the conditions in the environments where people are born, live, learn, work, play, worship, and age that affect a wide range of health, functioning, and quality-of-life outcomes and risks. (e.g., housing, food insecurity, transportation, etc.). SDOH-related Z codes ranging from Z55-Z65 are the ICD-10-CM diagnosis codes used to document SDOH data Z55 - Problems related to education and literacy Z56 - Problems related to employment and unemployment Z57 - Occupational exposure to risk factors Z58 - Problems  related to physical environment Z59 - Problems related to housing and economic circumstances [phone] - Problems related to social environment [phone] - Problems related to upbringing [phone] - Other problems related to primary support group, including family circumstances Z63 - Problems related to certain psychosocial circumstances Z65 - Problems related to other psychosocial circumstances

## 2024-11-13 NOTE — Patient Instructions (Incomplete)
 ICD-10-CM   1. IPF (idiopathic pulmonary fibrosis) (HCC)  J84.112     2. Encounter for therapeutic drug monitoring  Z51.81     3. Diarrhea due to drug  K52.1     4. Centrilobular emphysema (HCC)  J43.2     5. Severe sleep apnea  G47.30     6. Exercise hypoxemia  R09.02     7. Screening for lung cancer  Z12.2        # Interstitial lung disease otherwise called pulmonary fibrosis # IPF -diagnosis given in January 2025 [based on the fact age greater than 65, previous smoking history, male gender, Caucasian ethnicity and also associated emphysema)  -Absent in 2018 started in 2019 and progressive.  -Risk factors included amiodarone  2021 and 2023 and COVID 2022  -Clinically stable - cct 2024 -> June 2025 PFT -Tolerating pirfenidone  well   Plan - Continue IPF-PRO registry  -Do spirometry and DLCO in 4 months -Check liver function test today  6/10/2025t - Cotninue ESBRIT but below are holiday rules for rash   #Diarrhea due to drug  - well controlled with CAROB  Plan Continue CAROB Flour OTC as follows Take 1 DESSERT spoon  size serving [approximately 7 g] before breakfast If still no response in 3 days then add another 7 g at dinner If still no response in 3 days then make it to spoon servings at breakfast and 2 spoon servings at dinner and hold  RASH Deu to drug  Plan  - Stop Esbriet  fully for 1 week and then restarted 1 pill 3 times daily for 1 week followed by 2 pills 3 times daily for 1 week and then 3 pills 3 times daily to continue - Wear full-length clothes including widebrimmed hat - Apply Eucerin SPF 50 - Despite the above if rash is still problematic call us  back   #Exercise hypoxemia and new diagnosis of sleep apnea 12/13/2023   -Continue to respect that you do not want to have portable oxygen or nighttime oxygen at this point. -Noted in January 2025 Dr. Starr Eddy is recommended CPAP titration study. -I have counseled the of the benefits of CPAP in the  setting of heart failure and sleep apnea.  Plan  --I have sent a message to Dr. Starr Eddy to explain further questions about CPAP titration study and nighttime oxygen use.    #Emphysema  - stable  Plan  - continue trelegy scheduled with albuteorl as needed  #Lung cancer screening -last CT scan September 2024  Plan  - We will capture information with a high-resolution CT chest for ILD in September 2025   Followup October 2025 after PFT and CT chest; 15-minute visit

## 2024-11-14 ENCOUNTER — Telehealth: Payer: Self-pay

## 2024-11-14 ENCOUNTER — Encounter: Payer: Self-pay | Admitting: Internal Medicine

## 2024-11-14 ENCOUNTER — Telehealth: Payer: Self-pay | Admitting: Internal Medicine

## 2024-11-14 ENCOUNTER — Ambulatory Visit

## 2024-11-14 ENCOUNTER — Ambulatory Visit: Admitting: Internal Medicine

## 2024-11-14 VITALS — BP 112/62 | HR 55 | Temp 97.7°F | Ht 75.0 in | Wt 212.8 lb

## 2024-11-14 DIAGNOSIS — J84112 Idiopathic pulmonary fibrosis: Secondary | ICD-10-CM

## 2024-11-14 DIAGNOSIS — Z5181 Encounter for therapeutic drug level monitoring: Secondary | ICD-10-CM

## 2024-11-14 DIAGNOSIS — J849 Interstitial pulmonary disease, unspecified: Secondary | ICD-10-CM

## 2024-11-14 DIAGNOSIS — K521 Toxic gastroenteritis and colitis: Secondary | ICD-10-CM | POA: Diagnosis not present

## 2024-11-14 DIAGNOSIS — L270 Generalized skin eruption due to drugs and medicaments taken internally: Secondary | ICD-10-CM

## 2024-11-14 DIAGNOSIS — Z122 Encounter for screening for malignant neoplasm of respiratory organs: Secondary | ICD-10-CM

## 2024-11-14 DIAGNOSIS — J432 Centrilobular emphysema: Secondary | ICD-10-CM

## 2024-11-14 DIAGNOSIS — R0902 Hypoxemia: Secondary | ICD-10-CM

## 2024-11-14 LAB — HEPATIC FUNCTION PANEL
ALT: 24 U/L (ref 0–53)
AST: 20 U/L (ref 0–37)
Albumin: 4.4 g/dL (ref 3.5–5.2)
Alkaline Phosphatase: 81 U/L (ref 39–117)
Bilirubin, Direct: 0.1 mg/dL (ref 0.0–0.3)
Total Bilirubin: 0.4 mg/dL (ref 0.2–1.2)
Total Protein: 6.6 g/dL (ref 6.0–8.3)

## 2024-11-14 LAB — PULMONARY FUNCTION TEST
DL/VA % pred: 65 %
DL/VA: 2.54 ml/min/mmHg/L
DLCO unc % pred: 38 %
DLCO unc: 11.19 ml/min/mmHg
FEF 25-75 Pre: 2.13 L/s
FEF2575-%Pred-Pre: 77 %
FEV1-%Pred-Pre: 66 %
FEV1-Pre: 2.5 L
FEV1FVC-%Pred-Pre: 104 %
FEV6-%Pred-Pre: 67 %
FEV6-Pre: 3.27 L
FEV6FVC-%Pred-Pre: 104 %
FVC-%Pred-Pre: 64 %
FVC-Pre: 3.3 L
Pre FEV1/FVC ratio: 76 %
Pre FEV6/FVC Ratio: 99 %

## 2024-11-14 NOTE — Telephone Encounter (Signed)
 Received referral for new start Jascayd (stop Esbriet ). Opening benefits investigation in this thread.

## 2024-11-14 NOTE — Telephone Encounter (Signed)
 Submitted a Prior Authorization request to [facility] ADVANTAGE/RX ADVANCE for JASCAYD  via CoverMyMeds. Will update once we receive a response.  Key: [id number]

## 2024-11-14 NOTE — Progress Notes (Signed)
Spiro/DLCO performed today. 

## 2024-11-14 NOTE — Telephone Encounter (Signed)
 Brandi and Langston RPh   So Kyle Bullock significant diarrhea with mexilitene and quit.  Iron was that one of my other patients is on mexiletine and his pirfenidone  dose had to be cut down.  In any event patient is not taking mexiletine right now.  Because of his rash I am recommending switch of the pirfenidone  to Nerandomilast   Brandi letting you know about the side effect with mexiletine and in case you need to restart this please note that he is on pirfenidone  for few to several more weeks and Aleck could probably guide you about the drug interaction which I forgot what it was?  QTc prolongation was something to do with CYP enzymes

## 2024-11-14 NOTE — Patient Instructions (Signed)
Spiro/DLCO performed today. 

## 2024-11-14 NOTE — Telephone Encounter (Signed)
 Mexiletine increases exposure to pirfenidone  due to CYP1A2 inhibition and increases risk of pirfenidone  side effects.   I see the plan to switch from pirfenidone  to Jascayd. In the best scenario, we start Jascayd as soon as possible and avoid the interaction between pirfenidone  and mexiletine. This will depend on insurance approval and cost of Jascayd. PA for [facility] submitted today, 11/14/24.  If he needs to restart mexiletine promptly, I recommend lowering pirfenidone  dose if pirfenidone  cannot be held temporarily to avoid side effects.  I should have an update on Jascayd in the next couple of days and will update in this thread.

## 2024-11-14 NOTE — Telephone Encounter (Signed)
 Alert remote transmission:  VT/VF episode occurred, successful ATTP delivered Event occurred 12/6 @ 14:35, duration 24sec, HR 190, falling into the VT-2 zone, ATP delivered x1 slowing to regular VS Mexilitine has been prescribed.    Patient with recent acute respiratory illness. Deferred starting mexiletine until this week.   LM to follow up with patient on mexiletine and diarrheal episodes.   Apparently the medication he is on from pulmonology can exacerbate his symptoms with GI upset (see his conversation with [facility] pulmonology today).  There is a potential increased interaction between the 2 medications as well.  They are considering switching his medication.  At present, does not appear patient has started the mexiletine yet.    Copying to Daphne Barrack, NP as well to keep her in the loop.

## 2024-11-15 NOTE — Telephone Encounter (Signed)
 Received notification from [facility] ADVANTAGE/RX ADVANCE regarding a prior authorization for JASCAYD. Authorization has been APPROVED from 11/14/24 to 12/07/25. Approval letter sent to scan center.  Authorization # [id number]

## 2024-11-15 NOTE — Telephone Encounter (Signed)
 I called and spoke with the patient.  Advised him of a recurrent episode of VT w/ ATP that occurred on 11/12/24 @ 14:35 pm. The patient advised he was asymptomatic at the time of the event.   Reviewed and obtained updates from the patient as below: Head & chest cold x 10 days > he completed a course of prednisone  & an antibiotic yesterday and feeling better. Saw Dr. Geronimo yesterday- his current treatment for his pulmonary fibrosis, Pirfenidone , has caused itching/ diarrhea-- the diarrhea was exacerbated when he attempted to start mexiletine, so he stopped the mexiletine. Kyle Bullock advised Dr. Geronimo has recommended he stop Pirfenidone  and start Jascyd and plans to transition him to this over the next month. Dr. Geronimo advised the patient to hold off on starting mexiletine until he can transition to the new medication for his pulmonary fibrosis Kyle Bullock also advised Dr. Geronimo was going to reach out to Daphne Barrack, NP directly to discuss the above recommendations further.    I have advised the patient we will update Brandi, NP of the above. I have reminded him today of [place] Driving restrictions x 6 months and that the clock resets with each episode of ATP. The patient voices understanding and is agreeable- he confirms he is not currently driving.   Kyle Bullock was appreciative of the call today and is aware we will follow up with him with any further recommendations once received.   Routing to Daphne Barrack, NP.

## 2024-11-15 NOTE — Telephone Encounter (Signed)
 THank you Aleck. When you talk to him about Jascayd pleae let him know about our conversation re Mexilitene

## 2024-11-16 ENCOUNTER — Ambulatory Visit: Payer: Self-pay | Admitting: Internal Medicine

## 2024-11-16 ENCOUNTER — Other Ambulatory Visit (HOSPITAL_COMMUNITY): Payer: Self-pay

## 2024-11-16 NOTE — Telephone Encounter (Signed)
 Per test claim copay for 30 day supply is $0. Pt can use [employer].

## 2024-11-16 NOTE — Progress Notes (Signed)
 Normal LFT

## 2024-11-17 ENCOUNTER — Other Ambulatory Visit: Payer: Self-pay

## 2024-11-17 ENCOUNTER — Ambulatory Visit: Attending: Internal Medicine

## 2024-11-17 DIAGNOSIS — J849 Interstitial pulmonary disease, unspecified: Secondary | ICD-10-CM

## 2024-11-17 MED ORDER — JASCAYD 18 MG PO TABS: 18.0000 mg | ORAL_TABLET | Freq: Two times a day (BID) | ORAL | 5 refills | Status: AC

## 2024-11-17 NOTE — Telephone Encounter (Signed)
 New start Jascayd counseling completed in pharmacotherapy visit note - see note 11/17/24.

## 2024-11-17 NOTE — Progress Notes (Signed)
 Kyle Bullock  Referring Provider: Dr. Geronimo  Virtual Visit via Telephone Note  I connected with Kyle Bullock on 11/17/2024 at  1:00 PM EST by telephone and verified that I am speaking with the correct person using two identifiers.  Location: Patient: home Provider: office   I discussed the limitations, risks, security and privacy concerns of performing an evaluation and management service by telephone and the availability of in person appointments. I also discussed with the patient that there may be a patient responsible charge related to this service. The patient expressed understanding and agreed to proceed.  Subjective:  Patient presents today by telephone to [facility] team for [facility] new start counseling. He is referred by his pulmonologist, Dr. Geronimo. Patient was last seen by Dr. Geronimo on 11/14/24.  PMH includes IPF. Currently taking pirfenidone  but plan to discontinue due to side effects (skin reaction).  On pirfenidone , he relates occasional diarrhea but he has found benefit with Dr. Reeves recommendation to use carob powder as needed.   Of note, there is also a drug interaction between pirfenidone  and mexiletine (recommended by cardiology), so he will stop pirfenidone  and start Jascayd. See 11/14/24 telephone thread for further details.   Objective: Allergies[1]  Outpatient Encounter Medications as of 11/17/2024  Medication Sig   acetaminophen  (TYLENOL ) 500 MG tablet Take 500 mg by mouth every 8 (eight) hours as needed for moderate pain.   atorvastatin  (LIPITOR) 20 MG tablet TAKE 1 TABLET BY MOUTH DAILY   cyanocobalamin  (VITAMIN B12) 1000 MCG tablet Take 1,000 mcg by mouth daily.   ELIQUIS  5 MG TABS tablet TAKE 1 TABLET BY MOUTH 2 TIMES A DAY   empagliflozin  (JARDIANCE ) 10 MG TABS tablet Take 1 tablet (10 mg total) by mouth daily before breakfast. (Patient not taking: Reported on 11/14/2024)   furosemide  (LASIX )  20 MG tablet TAKE 1 TABLET BY MOUTH DAILY AS NEEDED   levothyroxine (SYNTHROID) 112 MCG tablet Take 112 mcg by mouth daily before breakfast.   metoprolol  succinate (TOPROL -XL) 25 MG 24 hr tablet Take 1 tablet (25 mg total) by mouth at bedtime.   mexiletine (MEXITIL ) 200 MG capsule Take 1 capsule (200 mg total) by mouth 2 (two) times daily. (Patient not taking: Reported on 11/14/2024)   nitroGLYCERIN  (NITROSTAT ) 0.4 MG SL tablet DISSOLVE 1 TAB UNDER TONGUE FOR CHEST PAIN - IF PAIN REMAINS AFTER 5 MIN, CALL 911 AND REPEAT DOSE. MAX 3 TABS IN 15 MINUTES   omega-3 acid ethyl esters (LOVAZA) 1 g capsule Take 2 g by mouth 2 (two) times daily.   Pirfenidone  267 MG TABS Take 3 tablets (801 mg total) by mouth 3 (three) times daily with meals.   sacubitril -valsartan  (ENTRESTO ) 24-26 MG Take 1 tablet by mouth 2 (two) times daily.   spironolactone  (ALDACTONE ) 25 MG tablet Take 0.5 tablets (12.5 mg total) by mouth daily.   TRELEGY ELLIPTA 100-62.5-25 MCG/ACT AEPB Take 1 puff by mouth daily.   No facility-administered encounter medications on file as of 11/17/2024.     Immunization History  Administered Date(s) Administered   DTaP, 5 pertussis antigens 12/14/2015   INFLUENZA, HIGH DOSE SEASONAL PF 09/16/2015, 08/30/2017   Influenza, Quadrivalent, Recombinant, Inj, Pf 09/04/2018, 08/27/2019, 09/08/2020, 09/14/2021, 09/26/2023   Influenza-Unspecified 08/17/2014, 09/20/2016, 09/21/2024   PFIZER(Purple Top)SARS-COV-2 Vaccination 12/28/2019, 01/16/2020   PNEUMOCOCCAL CONJUGATE-20 02/12/2023   Pneumococcal Conjugate-13 07/26/2015   Pneumococcal Polysaccharide-23 11/13/2014, 12/18/2016   Zoster Recombinant(Shingrix) 07/02/2019, 11/10/2019      PFT's TLC  Date Value Ref  Range Status  10/08/2023 4.21 L Final      CMP     Component Value Date/Time   NA 140 10/28/2024 0921   K 5.0 10/28/2024 0921   CL 102 10/28/2024 0921   CO2 25 10/28/2024 0921   GLUCOSE 87 10/28/2024 0921   GLUCOSE 95 04/05/2024  1525   BUN 15 10/28/2024 0921   CREATININE 1.07 10/28/2024 0921   CREATININE 0.98 01/29/2015 1536   CALCIUM  9.6 10/28/2024 0921   PROT 6.6 11/14/2024 1121   PROT 6.4 12/31/2022 1600   ALBUMIN 4.4 11/14/2024 1121   ALBUMIN 4.5 12/31/2022 1600   AST 20 11/14/2024 1121   ALT 24 11/14/2024 1121   ALKPHOS 81 11/14/2024 1121   BILITOT 0.4 11/14/2024 1121   BILITOT 0.4 12/31/2022 1600   GFRNONAA >60 04/05/2024 1525   GFRAA >60 09/02/2020 2252    HRCT (08/24/24) IMPRESSION: 1. Pulmonary focal pattern of interstitial lung disease and air trapping, as detailed above, with less of a ground-glass component compared with 08/17/2023 but with progressive fibrosis from remote examination 02/02/2019. Findings may be due to fibrotic hypersensitivity pneumonitis. Findings are suggestive of an alternative diagnosis (not UIP) per consensus guidelines: Diagnosis of Idiopathic Pulmonary Fibrosis: An Official ATS/ERS/JRS/ALAT Clinical Practice Guideline. Am JINNY Honey Crit Care Med Vol 198, Iss 5, [phone], Aug 08 2017.  Assessment and Plan  Jascayd Medication Management Thoroughly counseled patient on the efficacy, mechanism of action, dosing, administration, adverse effects, and monitoring parameters of Jascayd.  Patient verbalized understanding.   Goals of Therapy: Will not stop or reverse the progression of ILD. It will slow the progression of ILD.   Dosing: Recommended dose will be 18mg  1 tablet twice daily. May be administered with or without regard to food.   Adverse Effects: Weight loss (nerandomilast monotherapy: 8%; background nintedanib: 14-16%; background pirfenidone : 6%) Decreased appetite (nerandomilast monotherapy: 6% to 9%; concomitant nintedanib: 7% to 10%; concomitant pirfenidone : 13%) Diarrhea (nerandomilast monotherapy: 17% to 26%; concomitant nintedanib: 50% to 62%; concomitant pirfenidone : 24%)  Monitoring: Monitor for diarrhea, decreased appetite, weight loss  Drug  interactions: nerandomilast is a major substrate of CYP3A4. Avoid use of moderate and strong CYP3A4 inducers or inhibitors.  Medication list reviewed - no documented use of moderate to strong CYP3A4 inhibitors at this time.  Access: Approval of Jascayd through: insurance Rx sent to: [facility]: [phone]   Medication Reconciliation A drug regimen assessment was performed, including review of allergies, interactions, disease-state management, dosing and immunization history. Medications were reviewed with the patient, including name, instructions, indication, goals of therapy, potential side effects, importance of adherence, and safe use.  PLAN:  - STOP pirfenidone  3-7 days before starting Jascayd  - START Jascayd 18mg  1 tablet twice daily. - He would like to start Jascayd after the holidays, at the beginning of January. I advised him to ensure he does not take pirfenidone  with mexiletine due to drug interaction. Someone from cardiology office should reach out regarding plan to restart mexiletine (he previously self-stopped due to side effects)  - Follow-up with Dr. Geronimo as recommended in June 2026 - Reach out to Bullock if new concerns come up. Provided pharmacy team contact information.  This appointment required 20 minutes of patient care (this includes precharting, chart review, review of results, virtual care, etc.).  Thank you for involving pharmacy to assist in providing this patient's care.   Aleck Puls, PharmD, BCPS, CPP Clinical Pharmacist  [facility]  [facility]    [1]  Allergies Allergen Reactions   Ambien  [Zolpidem  Tartrate] Anxiety and Other (See Comments)    Causes nightmares   Lisinopril  Cough   Brilinta  [Ticagrelor ] Cough    ? Dyspnea. Changed to Effient  11/2014.

## 2024-11-18 NOTE — Telephone Encounter (Signed)
 Thanks Aleck ad Daphne  If you need him on Mexilitene now -> just have him stop esbriet  NOW. HE can strt Jascayed in January

## 2024-11-18 NOTE — Telephone Encounter (Signed)
 Please see note below.

## 2024-11-29 DIAGNOSIS — I5022 Chronic systolic (congestive) heart failure: Secondary | ICD-10-CM | POA: Diagnosis not present

## 2024-12-05 LAB — CUP PACEART REMOTE DEVICE CHECK
Battery Remaining Longevity: 25 mo
Battery Remaining Percentage: 24 %
Battery Voltage: 2.9 V
Brady Statistic RV Percent Paced: 1 %
Date Time Interrogation Session: 20251228214729
HighPow Impedance: 65 Ohm
HighPow Impedance: 65 Ohm
Implantable Lead Connection Status: 753985
Implantable Lead Implant Date: 20160323
Implantable Lead Location: 753860
Implantable Pulse Generator Implant Date: 20160323
Lead Channel Impedance Value: 360 Ohm
Lead Channel Pacing Threshold Amplitude: 0.75 V
Lead Channel Pacing Threshold Pulse Width: 0.5 ms
Lead Channel Sensing Intrinsic Amplitude: 12 mV
Lead Channel Setting Pacing Amplitude: 2.5 V
Lead Channel Setting Pacing Pulse Width: 0.5 ms
Lead Channel Setting Sensing Sensitivity: 0.5 mV
Pulse Gen Serial Number: 7255600
Zone Setting Status: 755011

## 2024-12-07 ENCOUNTER — Ambulatory Visit: Payer: Self-pay | Admitting: Cardiovascular Disease

## 2024-12-07 ENCOUNTER — Telehealth: Payer: Self-pay

## 2024-12-07 NOTE — Telephone Encounter (Signed)
 Patient called and LVM regarding a question about Jascayd .   ATC patient at [phone]. LVM with my office line to return call.

## 2024-12-07 NOTE — Progress Notes (Signed)
 Remote ICD Transmission

## 2024-12-13 ENCOUNTER — Emergency Department (HOSPITAL_BASED_OUTPATIENT_CLINIC_OR_DEPARTMENT_OTHER)
Admission: EM | Admit: 2024-12-13 | Discharge: 2024-12-13 | Disposition: A | Discharge status: Home / Self Care | Attending: Emergency Medicine | Admitting: Emergency Medicine

## 2024-12-13 ENCOUNTER — Emergency Department (HOSPITAL_BASED_OUTPATIENT_CLINIC_OR_DEPARTMENT_OTHER)

## 2024-12-13 ENCOUNTER — Other Ambulatory Visit: Payer: Self-pay

## 2024-12-13 ENCOUNTER — Encounter (HOSPITAL_BASED_OUTPATIENT_CLINIC_OR_DEPARTMENT_OTHER): Payer: Self-pay | Admitting: Emergency Medicine

## 2024-12-13 DIAGNOSIS — I482 Chronic atrial fibrillation, unspecified: Secondary | ICD-10-CM | POA: Diagnosis not present

## 2024-12-13 DIAGNOSIS — I252 Old myocardial infarction: Secondary | ICD-10-CM | POA: Insufficient documentation

## 2024-12-13 DIAGNOSIS — Z79899 Other long term (current) drug therapy: Secondary | ICD-10-CM | POA: Diagnosis not present

## 2024-12-13 DIAGNOSIS — R072 Precordial pain: Secondary | ICD-10-CM | POA: Diagnosis present

## 2024-12-13 DIAGNOSIS — I10 Essential (primary) hypertension: Secondary | ICD-10-CM | POA: Diagnosis not present

## 2024-12-13 DIAGNOSIS — R0789 Other chest pain: Secondary | ICD-10-CM | POA: Insufficient documentation

## 2024-12-13 DIAGNOSIS — Z7901 Long term (current) use of anticoagulants: Secondary | ICD-10-CM | POA: Diagnosis not present

## 2024-12-13 DIAGNOSIS — R0602 Shortness of breath: Secondary | ICD-10-CM | POA: Diagnosis present

## 2024-12-13 DIAGNOSIS — I2510 Atherosclerotic heart disease of native coronary artery without angina pectoris: Secondary | ICD-10-CM | POA: Diagnosis not present

## 2024-12-13 LAB — PROTIME-INR
INR: 1.1 (ref 0.8–1.2)
Prothrombin Time: 14.7 s (ref 11.4–15.2)

## 2024-12-13 LAB — BASIC METABOLIC PANEL WITH GFR
Anion gap: 12 (ref 5–15)
BUN: 18 mg/dL (ref 8–23)
CO2: 23 mmol/L (ref 22–32)
Calcium: 9.9 mg/dL (ref 8.9–10.3)
Chloride: 103 mmol/L (ref 98–111)
Creatinine, Ser: 1.1 mg/dL (ref 0.61–1.24)
GFR, Estimated: >60 mL/min
Glucose, Bld: 105 mg/dL — ABNORMAL HIGH (ref 70–99)
Potassium: 4.6 mmol/L (ref 3.5–5.1)
Sodium: 138 mmol/L (ref 135–145)

## 2024-12-13 LAB — CBC
HCT: 43.4 % (ref 39.0–52.0)
Hemoglobin: 15 g/dL (ref 13.0–17.0)
MCH: 31.5 pg (ref 26.0–34.0)
MCHC: 34.6 g/dL (ref 30.0–36.0)
MCV: 91.2 fL (ref 80.0–100.0)
Platelets: 283 K/uL (ref 150–400)
RBC: 4.76 MIL/uL (ref 4.22–5.81)
RDW: 13.3 % (ref 11.5–15.5)
WBC: 7 K/uL (ref 4.0–10.5)
nRBC: 0 % (ref 0.0–0.2)

## 2024-12-13 LAB — TROPONIN T, HIGH SENSITIVITY
Troponin T High Sensitivity: 29 ng/L — ABNORMAL HIGH (ref 0–19)
Troponin T High Sensitivity: 25 ng/L — ABNORMAL HIGH (ref 0–19)
Troponin T High Sensitivity: 24 ng/L — ABNORMAL HIGH (ref 0–19)

## 2024-12-13 LAB — PRO BRAIN NATRIURETIC PEPTIDE: Pro Brain Natriuretic Peptide: 520 pg/mL — ABNORMAL HIGH

## 2024-12-13 MED ORDER — SODIUM CHLORIDE 0.9 % IV BOLUS
500.0000 mL | Freq: Once | INTRAVENOUS | Status: AC
  Administered 2024-12-13: 500 mL via INTRAVENOUS

## 2024-12-13 MED ORDER — IOHEXOL 350 MG/ML SOLN
100.0000 mL | Freq: Once | INTRAVENOUS | Status: AC | PRN
  Administered 2024-12-13: 75 mL via INTRAVENOUS

## 2024-12-13 MED ORDER — FENTANYL CITRATE (PF) 50 MCG/ML IJ SOSY
50.0000 ug | PREFILLED_SYRINGE | Freq: Once | INTRAMUSCULAR | Status: AC
  Administered 2024-12-13: 50 ug via INTRAVENOUS
  Filled 2024-12-13: qty 1

## 2024-12-13 NOTE — ED Triage Notes (Signed)
 Pt endorses consistent central CP since this morning starting at 8am. Took 2 nitroglycerin  with no relief

## 2024-12-13 NOTE — Discharge Instructions (Addendum)
 We discussed the results of your workup on today's visit.  You experience any worsening symptoms such as new onset chest pain, shortness of breath, worsening symptoms please return to the emergency department.

## 2024-12-13 NOTE — ED Provider Notes (Signed)
 Ultrasound ED Echo  Date/Time: 12/13/2024 12:00 PM  Performed by: Pamella Ozell LABOR, DO Authorized by: Pamella Ozell LABOR, DO   Procedure details:    Indications: chest pain     Views: subxiphoid, parasternal long axis view, parasternal short axis view, apical 4 chamber view and IVC view     Images: archived   Findings:    Pericardium: no pericardial effusion     LV Function: depressed (30 - 50%)     RV Diameter: normal     IVC: normal   Impression:    Impression: normal       Pamella Ozell LABOR, DO 12/13/24 1548

## 2024-12-13 NOTE — ED Provider Notes (Signed)
 " [place] EMERGENCY DEPARTMENT AT [facility] Provider Note   CSN: [id number] Arrival date & time: 12/13/24  9047     Patient presents with: Chest Pain   Kyle Bullock is a 74 y.o. male.   74 y.o male with a PMH of hypertension, V-fib, CAD, pericarditis presents to the ED with a chief complaint of chest pain of sudden onset which began around 8 AM.  Patient describes a sudden onset of somewhat aching in the substernal part of his chest without any radiation.  He did take nitroglycerin  x 2 without any improvement in his symptoms.  He describes the pain as different, worsened whenever he inhales but better whenever he exhales.  He tells me the pain feels like like a cracked sternum .  He is currently getting over a cold, has been having a lot of coughing with this.  He does have a defibrillator and reports no shocks, or signs of an irregular beat.  He is currently followed by Dr. Odis Savant along with an additional cardiologist, his last visit to them was last fall.  He did have an MI approximately 10 years ago.  Patient still takes Eliquis , although he has had 2 ablations for his A-fib. He was moving wood yesterday, was short of breath but has been short of breath for a while. No fever, abdominal pain, vomiting or leg swelling.    The history is provided by the patient.  Chest Pain Pain location:  Substernal area Pain quality: aching   Pain radiates to:  Does not radiate Pain severity:  Moderate Onset quality:  Sudden Duration:  2 hours Timing:  Intermittent Progression:  Unchanged Chronicity:  New Context: breathing   Relieved by:  Nothing Worsened by:  Deep breathing Ineffective treatments:  Nitroglycerin  Associated symptoms: shortness of breath   Associated symptoms: no abdominal pain, no back pain, no fever, no nausea and no vomiting   Risk factors: coronary artery disease, hypertension and male sex   Risk factors: no diabetes mellitus        Prior to Admission  medications  Medication Sig Start Date End Date Taking? Authorizing Provider  acetaminophen  (TYLENOL ) 500 MG tablet Take 500 mg by mouth every 8 (eight) hours as needed for moderate pain.    [provider]  atorvastatin  (LIPITOR) 20 MG tablet TAKE 1 TABLET BY MOUTH DAILY 01/29/24   [name], Daniel R, MD  cyanocobalamin  (VITAMIN B12) 1000 MCG tablet Take 1,000 mcg by mouth daily.    [provider]  ELIQUIS  5 MG TABS tablet TAKE 1 TABLET BY MOUTH 2 TIMES A DAY 09/12/24   Cindie Ole DASEN, MD  empagliflozin  (JARDIANCE ) 10 MG TABS tablet Take 1 tablet (10 mg total) by mouth daily before breakfast. Patient not taking: Reported on 11/14/2024 08/24/24   [name], Toribio SAUNDERS, MD  furosemide  (LASIX ) 20 MG tablet TAKE 1 TABLET BY MOUTH DAILY AS NEEDED 10/31/24   [name], Daniel R, MD  levothyroxine (SYNTHROID) 112 MCG tablet Take 112 mcg by mouth daily before breakfast. 01/30/22   [provider]  metoprolol  succinate (TOPROL -XL) 25 MG 24 hr tablet Take 1 tablet (25 mg total) by mouth at bedtime. 11/02/23   Lesia Ozell Barter, PA-C  mexiletine (MEXITIL ) 200 MG capsule Take 1 capsule (200 mg total) by mouth 2 (two) times daily. Patient not taking: Reported on 11/14/2024 11/02/24   Aniceto Daphne CROME, NP  nerandomilast  (JASCAYD ) 18 MG tablet Take 18 mg by mouth every 12 (twelve) hours. Take with  or without food. 11/17/24   Geronimo Amel, MD  nitroGLYCERIN  (NITROSTAT ) 0.4 MG SL tablet DISSOLVE 1 TAB UNDER TONGUE FOR CHEST PAIN - IF PAIN REMAINS AFTER 5 MIN, CALL 911 AND REPEAT DOSE. MAX 3 TABS IN 15 MINUTES 07/19/24   [name], Toribio SAUNDERS, MD  omega-3 acid ethyl esters (LOVAZA) 1 g capsule Take 2 g by mouth 2 (two) times daily. 04/23/22   [provider]  sacubitril -valsartan  (ENTRESTO ) 24-26 MG Take 1 tablet by mouth 2 (two) times daily. 12/22/23   [name], Toribio SAUNDERS, MD  spironolactone  (ALDACTONE ) 25 MG tablet Take 0.5 tablets (12.5 mg total) by mouth daily. 11/07/24    [name], Toribio SAUNDERS, MD  TRELEGY ELLIPTA 100-62.5-25 MCG/ACT AEPB Take 1 puff by mouth daily. 02/12/23   [provider]    Allergies: Ambien  [zolpidem  tartrate], Lisinopril , Brilinta  [ticagrelor ], and Jardiance  [empagliflozin ]    Review of Systems  Constitutional:  Negative for chills and fever.  HENT:  Negative for sore throat.   Respiratory:  Positive for shortness of breath.   Cardiovascular:  Positive for chest pain.  Gastrointestinal:  Negative for abdominal pain, diarrhea, nausea and vomiting.  Genitourinary:  Negative for flank pain.  Musculoskeletal:  Negative for back pain.  All other systems reviewed and are negative.   Updated Vital Signs BP (!) 148/91   Pulse 67   Temp 98.3 F (36.8 C) (Oral)   Resp 17   Ht 6' 3 (1.905 m)   Wt 91.6 kg   SpO2 95%   BMI 25.25 kg/m   Physical Exam Vitals and nursing note reviewed.  Constitutional:      Appearance: He is well-developed.  HENT:     Head: Normocephalic and atraumatic.  Cardiovascular:     Rate and Rhythm: Normal rate and regular rhythm.  Pulmonary:     Effort: Pulmonary effort is normal.     Breath sounds: No decreased breath sounds or wheezing.  Chest:     Chest wall: No tenderness.  Musculoskeletal:     Cervical back: Normal range of motion and neck supple.     Right lower leg: No edema.     Left lower leg: No edema.  Skin:    General: Skin is warm and dry.  Neurological:     Mental Status: He is alert and oriented to person, place, and time.     (all labs ordered are listed, but only abnormal results are displayed) Labs Reviewed  BASIC METABOLIC PANEL WITH GFR - Abnormal; Notable for the following components:      Result Value   Glucose, Bld 105 (*)    All other components within normal limits  PRO BRAIN NATRIURETIC PEPTIDE - Abnormal; Notable for the following components:   Pro Brain Natriuretic Peptide 520.0 (*)    All other components within normal limits  TROPONIN T, HIGH SENSITIVITY  - Abnormal; Notable for the following components:   Troponin T High Sensitivity 29 (*)    All other components within normal limits  TROPONIN T, HIGH SENSITIVITY - Abnormal; Notable for the following components:   Troponin T High Sensitivity 25 (*)    All other components within normal limits  TROPONIN T, HIGH SENSITIVITY - Abnormal; Notable for the following components:   Troponin T High Sensitivity 24 (*)    All other components within normal limits  CBC  PROTIME-INR    EKG: EKG Interpretation Date/Time:  Tuesday December 13 2024 10:04:41 EST Ventricular Rate:  52 PR Interval:  205 QRS Duration:  153 QT Interval:  476 QTC Calculation: 443 R Axis:   -71  Text Interpretation: Sinus rhythm Right bundle branch block Anterior infarct, old Confirmed by Pamella Sharper [phone]) on 12/13/2024 10:08:10 AM  Radiology: CT Angio Chest PE W and/or Wo Contrast Result Date: 12/13/2024 CLINICAL DATA:  Elevated D-dimer. Shortness of breath. Intermediate probability for pulmonary embolism. EXAM: CT ANGIOGRAPHY CHEST WITH CONTRAST TECHNIQUE: Multidetector CT imaging of the chest was performed using the standard protocol during bolus administration of intravenous contrast. Multiplanar CT image reconstructions and MIPs were obtained to evaluate the vascular anatomy. RADIATION DOSE REDUCTION: This exam was performed according to the departmental dose-optimization program which includes automated exposure control, adjustment of the mA and/or kV according to patient size and/or use of iterative reconstruction technique. CONTRAST:  75mL OMNIPAQUE  IOHEXOL  350 MG/ML SOLN COMPARISON:  08/24/2024 FINDINGS: Cardiovascular: Satisfactory opacification of pulmonary arteries noted, and no pulmonary emboli identified. No evidence of thoracic aortic dissection or aneurysm. Aortic and coronary atherosclerotic calcification noted. Moderate cardiomegaly with predominant left-sided enlargement. Mediastinum/Nodes: Stable shotty  mediastinal lymph nodes in right paratracheal region, likely reactive in etiology. Lungs/Pleura: Chronic interstitial lung disease with peripheral predominance again noted. Increased component of diffuse ground-glass opacity also seen. Mild centrilobular and paraseptal emphysema. No evidence of focal consolidation, mass, or pleural effusion. Upper abdomen: No acute findings. Musculoskeletal: No suspicious bone lesions identified. Review of the MIP images confirms the above findings. IMPRESSION: No evidence of pulmonary embolism. Chronic interstitial lung disease, with increased component of diffuse ground-glass opacity consistent with active inflammation or edema. Emphysema. Electronically Signed   By: Norleen DELENA Kil M.D.   On: 12/13/2024 12:16   DG Chest Port 1 View Result Date: 12/13/2024 CLINICAL DATA:  Chest pain. EXAM: PORTABLE CHEST 1 VIEW COMPARISON:  CT chest dated 08/24/2024. FINDINGS: The heart size and mediastinal contours are within normal limits. Single lead left-sided pacer/defibrillator. Chronic interstitial reticular/fibrotic changes. Superimposed pulmonary vascular congestion cannot be excluded. Focal pleural calcifications again noted in the right lateral lower lobe. No pleural effusion or pneumothorax. No acute osseous abnormality. IMPRESSION: Chronic interstitial reticular/fibrotic changes throughout the lungs. Superimposed pulmonary vascular congestion cannot be excluded. Electronically Signed   By: Harrietta Sherry M.D.   On: 12/13/2024 10:58     Procedures   Medications Ordered in the ED  fentaNYL  (SUBLIMAZE ) injection 50 mcg (50 mcg Intravenous Given 12/13/24 1126)  sodium chloride  0.9 % bolus 500 mL (0 mLs Intravenous Stopped 12/13/24 1230)  iohexol  (OMNIPAQUE ) 350 MG/ML injection 100 mL (75 mLs Intravenous Contrast Given 12/13/24 1120)    Clinical Course as of 12/13/24 1528  Tue Dec 13, 2024  1223 Troponin T High Sensitivity(!): 29 [JS]    Clinical Course User Index [JS] Arlind Klingerman,  Ismail Graziani, PA-C                                 Medical Decision Making Amount and/or Complexity of Data Reviewed Labs: ordered. Decision-making details documented in ED Course. Radiology: ordered.  Risk Prescription drug management.   This patient presents to the ED for concern of chest pain, this involves a number of treatment options, and is a complaint that carries with it a high risk of complications and morbidity.  The differential diagnosis includes ACS, pulmonary Blossom, dissection.   Co morbidities: Discussed in HPI   Brief History:  See HPI.  EMR reviewed including pt PMHx, past surgical history and past visits to ER.   See  HPI for more details   Lab Tests:  I ordered and independently interpreted labs.  The pertinent results include:    I personally reviewed all laboratory work and imaging. Metabolic panel without any acute abnormality specifically kidney function within normal limits and no significant electrolyte abnormalities. CBC without leukocytosis or significant anemia.  PT and INR normal.  proBNP is negative according to his age-adjusted.  Troponin T elevated at 29, repeat at 25.  Imaging Studies:  CT angio chest showed: No evidence of pulmonary embolism.    Chronic interstitial lung disease, with increased component of  diffuse ground-glass opacity consistent with active inflammation or  edema.    Cardiac Monitoring:  The patient was maintained on a cardiac monitor.  I personally viewed and interpreted the cardiac monitored which showed an underlying rhythm of:NSR EKG non-ischemic  Medicines ordered:  I ordered medication including fentanyl ,bolus  for symptomatic treatment. Reevaluation of the patient after these medicines showed that the patient resolved I have reviewed the patients home medicines and have made adjustments as needed  Consults:  1:28 PM I requested consultation with Dr. Hart from cardiology,  and discussed lab and  imaging findings as well as pertinent plan - they recommend: Inpatient obs versus discharge.  Reevaluation:  After the interventions noted above I re-evaluated patient and found that they have :resolved  Social Determinants of Health:  The patient's social determinants of health were a factor in the care of this patient  Problem List / ED Course:  Patient presented to the ED with a chief complaint of sudden onset of chest pain which began around 8 AM, states this happened suddenly, and has had a prior heart attack in the past however there has not been any feeling of these being the same.  Blood work here was unremarkable aside from some slight elevation in his troponin at 29, this has trended downward.  His chest x-ray was without any acute findings.  He did just get rid of upper respiratory infection, symptoms patient for post viral pneumonia, chest x-ray is within normal limits.  There is some concern for pulmonary embolism with him feeling more short of breath throughout the prior days.  CT angio chest did not show any pulmonary embolism.  He did just get started on new medications such as Jayscad by cardiologist.  On evaluation patient is overall well-appearing, given a small amount of fentanyl , this did resolve his pain.  Discussed his case with cardiologist on-call, who recommended observation versus close follow-up with outpatient follow-up. A bedside cardiac ultrasound was obtained, no signs of fluid, no acute abnormality noted.  I discussed these options with patient, shared decision making versus going home, seeing as a third troponin was added and is trending downwards with patient's pain resolved I do have a lower suspicion for ACS at this time.  We discussed outpatient follow-up along with return for worsening symptoms.  Patient and his wife are agreeable to plan and treatment at this time, I discussed this case with my attending Dr. Pamella who also evaluated patient.  Patient is  hemodynamically stable for discharge.   Dispostion:  After consideration of the diagnostic results and the patients response to treatment, I feel that the patent would benefit from close follow-up with cardiologist, will have him follow-up this week.   Portions of this note were generated with Scientist, clinical (histocompatibility and immunogenetics). Dictation errors may occur despite best attempts at proofreading.   Final diagnoses:  Atypical chest pain    ED Discharge Orders  None          Maureen Broad, PA-C 12/13/24 1528    Pamella Ozell LABOR, DO 12/13/24 1549  "

## 2024-12-16 ENCOUNTER — Ambulatory Visit (HOSPITAL_COMMUNITY)
Admission: RE | Admit: 2024-12-16 | Discharge: 2024-12-16 | Disposition: A | Source: Ambulatory Visit | Attending: Pulmonary Disease | Admitting: Pulmonary Disease

## 2024-12-16 DIAGNOSIS — I5022 Chronic systolic (congestive) heart failure: Secondary | ICD-10-CM | POA: Insufficient documentation

## 2024-12-16 DIAGNOSIS — I472 Ventricular tachycardia, unspecified: Secondary | ICD-10-CM | POA: Diagnosis not present

## 2024-12-16 LAB — ECHOCARDIOGRAM COMPLETE
Area-P 1/2: 5.07 cm2
Est EF: 25
S' Lateral: 5 cm

## 2024-12-20 ENCOUNTER — Ambulatory Visit: Payer: Self-pay | Admitting: Pulmonary Disease

## 2025-01-03 ENCOUNTER — Encounter (HOSPITAL_COMMUNITY): Payer: Self-pay | Admitting: Internal Medicine

## 2025-01-03 ENCOUNTER — Ambulatory Visit (HOSPITAL_COMMUNITY)
Admission: RE | Admit: 2025-01-03 | Discharge: 2025-01-03 | Disposition: A | Source: Ambulatory Visit | Attending: Internal Medicine | Admitting: Internal Medicine

## 2025-01-03 VITALS — BP 98/62 | HR 64 | Ht 75.0 in | Wt 211.8 lb

## 2025-01-03 DIAGNOSIS — Z95810 Presence of automatic (implantable) cardiac defibrillator: Secondary | ICD-10-CM | POA: Diagnosis not present

## 2025-01-03 DIAGNOSIS — I5022 Chronic systolic (congestive) heart failure: Secondary | ICD-10-CM | POA: Insufficient documentation

## 2025-01-03 DIAGNOSIS — I4901 Ventricular fibrillation: Secondary | ICD-10-CM | POA: Diagnosis not present

## 2025-01-03 DIAGNOSIS — Z7901 Long term (current) use of anticoagulants: Secondary | ICD-10-CM | POA: Insufficient documentation

## 2025-01-03 DIAGNOSIS — J849 Interstitial pulmonary disease, unspecified: Secondary | ICD-10-CM | POA: Insufficient documentation

## 2025-01-03 DIAGNOSIS — I2510 Atherosclerotic heart disease of native coronary artery without angina pectoris: Secondary | ICD-10-CM | POA: Diagnosis not present

## 2025-01-03 DIAGNOSIS — I472 Ventricular tachycardia, unspecified: Secondary | ICD-10-CM

## 2025-01-03 DIAGNOSIS — J439 Emphysema, unspecified: Secondary | ICD-10-CM | POA: Diagnosis not present

## 2025-01-03 DIAGNOSIS — I480 Paroxysmal atrial fibrillation: Secondary | ICD-10-CM | POA: Diagnosis not present

## 2025-01-03 DIAGNOSIS — Z79899 Other long term (current) drug therapy: Secondary | ICD-10-CM | POA: Insufficient documentation

## 2025-01-03 DIAGNOSIS — E875 Hyperkalemia: Secondary | ICD-10-CM | POA: Diagnosis not present

## 2025-01-03 DIAGNOSIS — I110 Hypertensive heart disease with heart failure: Secondary | ICD-10-CM | POA: Diagnosis not present

## 2025-01-03 DIAGNOSIS — G4733 Obstructive sleep apnea (adult) (pediatric): Secondary | ICD-10-CM | POA: Insufficient documentation

## 2025-01-03 DIAGNOSIS — Z955 Presence of coronary angioplasty implant and graft: Secondary | ICD-10-CM | POA: Diagnosis not present

## 2025-01-03 DIAGNOSIS — I252 Old myocardial infarction: Secondary | ICD-10-CM | POA: Insufficient documentation

## 2025-01-03 DIAGNOSIS — Z87891 Personal history of nicotine dependence: Secondary | ICD-10-CM | POA: Diagnosis not present

## 2025-01-03 DIAGNOSIS — I255 Ischemic cardiomyopathy: Secondary | ICD-10-CM | POA: Diagnosis not present

## 2025-01-03 DIAGNOSIS — I4891 Unspecified atrial fibrillation: Secondary | ICD-10-CM | POA: Diagnosis not present

## 2025-01-03 NOTE — Patient Instructions (Signed)
 There has been no changes to your medications.  You have been referred to cardiac Rehab. They will call you to arrange your appointments.  Your physician recommends that you schedule a follow-up appointment in: 4 months.  If you have any questions or concerns before your next appointment please send us  a message through [place] or call our office at [phone].    TO LEAVE A MESSAGE FOR THE NURSE SELECT OPTION 2, PLEASE LEAVE A MESSAGE INCLUDING: YOUR NAME DATE OF BIRTH CALL BACK NUMBER REASON FOR CALL**this is important as we prioritize the call backs  YOU WILL RECEIVE A CALL BACK THE SAME DAY AS LONG AS YOU CALL BEFORE 4:00 PM  At the [facility], you and your health needs are our priority. As part of our continuing mission to provide you with exceptional heart care, we have created designated Provider Care Teams. These Care Teams include your primary Cardiologist (physician) and Advanced Practice Providers (APPs- Physician Assistants and Nurse Practitioners) who all work together to provide you with the care you need, when you need it.   You may see any of the following providers on your designated Care Team at your next follow up: Dr Toribio Fuel Dr Ezra Shuck Dr. Morene Brownie Greig Mosses, NP Caffie Shed, [place] [facility], [place] Beckey Coe, NP Jordan Lee, NP Ellouise Class, NP Tinnie Redman, PharmD Jaun Bash, PharmD   Please be sure to bring in all your medications bottles to every appointment.    Thank you for choosing [facility]

## 2025-01-03 NOTE — Progress Notes (Signed)
 Patient ID: Kyle Bullock, male   DOB: 1951/11/06, 74 y.o.   MRN: [id number]    [facility] Note    History of Present Illness:  Kyle Bullock is a 74 y.o. male who is referred to the HF Clinic by Dr. Wonda. He has h/o CAD and ischemic cardiomyopathy 20-25%.  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    In December 2015, he suffered an acute anterolateral MI complicated by VF arrest in [place] . He underwent drug-eluting stent placement to the LAD and PTCA of the diagonal. EF was 25% and a life vest was placed prior to discharge. Following return to [place], he developed dyspnea and presented to [facility] where he was readmitted. His Brilinta  was switched to Effient  as it was felt the Brilinta  may have been playing a role in his dyspnea. He was also switched from an ACE inhibitor to an ARB. He was seen by electrophysiology with recommendation for ongoing life vest therapy and follow-up echo in 90 days post revascularization. He was then seen in clinic in mid December where he reported pleuritic chest pain. He was readmitted and underwent diagnostic catheterization revealing patency of previously placed stent and area of balloon angioplasty in the diagonal. EF was 20-25% with diffuse hypokinesis and normal pericardial appearance. He did have some hypotension requiring discontinuation of ARB therapy. His beta blocker was also down titrated. He was diagnosed with Dressler's syndrome and was placed on prednisone  taper and colchicine .  CT with COPD. Referred to Dr. Alaine who felt he had mild COPD but no ILD. Given PRN albuterol . Has repeat CT scheduled for 1 year.   Had ATP in 7/22. Found on device interrogation. Seen by EP. According to EP device showed initially irregular arrhythmia concerning for Afib. He then developed a regular tachy arrhythmia (likely VT) which was terminated with ATP. After ATP, his rhythm was again regular. Zio placed showed SVT with  some ectopy. No VT or AF.   Cath in 8/22 for CP with stable CAD.   Had ICD shock 12/29 while he was stacking wood and carrying large loads, when he received an ICD shock. Seen by EP. Felt to have AF/AFL possibly leading to VT. Zio placed showing 5% AF/AFL. Started on amio. Eliquis  resumed.   Echo 1/23 EF 25-30%   Had AF ablation 08/04/22.   In May 2024. Hi-res CT: + ILD. Saw Dr. Darlean and Lambert amio stopped. Started prednisone   Echo 10/24 EF 25-30% RV mildly reduced Personally reviewed  Device alert received 10/19/24 in Device Clinic for VT/VF s/p successful ATP > 15 VT-2, 3 VT-1 classified events.  HR's 180-190 bpm, ATP during events 1-3 times during each event. There was some irregularity to EGM. Started on mexilitene   Has been following with Dr. Geronimo for ILD.   Seen in ED 12/13/24 for CP. Described it as feeling like a cracked sternum. No improvement with NTG. Had the flu before that and was coughing a lot. HStrop 24 -> 25 -> 25 -> 29. ECG ok   Here for f/u today with his wife. Not very active since getting the flu.Feels ok. Back to baseline. No further CP. No edema, orthopnea or PND. Taking lasix  as needed  ICD interrogated No recent VT fluid was up but now improved   Cardiac studies:  CPX 07/06/23  FVC 3.02 (62%)      FEV1 2.41 (66%)        FEV1/FVC 80 (106%)  MVV 119 (84%)   BP rest: 94/60 Standing BP: 90/62 BP peak: 132/60  Peak VO2: 15.0 (57% predicted peak VO2)  VE/VCO2 slope:  44  OUES: 1.63  Peak RER: 1.12   Cath 8/22   Prox RCA to Mid RCA lesion is 55% stenosed.   1st Mrg lesion is 30% stenosed.   Ost LAD to Prox LAD lesion is 30% stenosed.   Previously placed Prox LAD to Dist LAD stent (unknown type) is  widely patent.   Findings:   Ao = 104/57 (77)  LV = 107/7 RA =  3 RV = 23/1 PA = 21/4 (10) PCW = 3 Fick cardiac output/index = 6.0/2.7 PVR = 1.2 WU Ao sat = 99% PA sat = 74%, 74%  CPX 2/18 FVC 3.49 (72%)      FEV1 2.49 (68%)         FEV1/FVC 71 (93%)        MVV 117 (82%) Resting HR: 62 Peak HR: 148   (97% age predicted max HR) BP rest: 84/60 BP peak: 156/62 Peak VO2: 19.6 (69% predicted peak VO2) VE/VCO2 slope:  37 OUES: 2.14 Peak RER: 1.06 Ventilatory Threshold: 17.4 (61% predicted or measured peak VO2) VE/MVV:  66% O2pulse:  12   (71% predicted O2pulse)  CPX 05/14/2015  Peak VO2: 19.0 (64.5% predicted peak VO2) VE/VCO2 slope:  41.5 OUES: 2.01 Peak RER: 1.06 Ventilatory Threshold: 14.1          (47.9% predicted or measured peak VO2)   Past Medical History:  Diagnosis Date   Abnormal TSH    a. 11/2014: felt d/t sick euthyroid.   AICD (automatic cardioverter/defibrillator) present 02/28/2015   CAD (coronary artery disease)    a. 11/2014: arrest/STEMI s/p Xience DES to the LAD and PTCA to the D1 [place], [place]). b. Relook cath 11/2014: no culprit, patent stent.   CHF (congestive heart failure) (HCC)    Dressler's syndrome (HCC)    a. 11/2014: tx with colchicine  and prednisone .   Dyslipidemia    Dyspnea    a. 11/2014: possibly due to combo of CHF and Brilinta . Brilinta  changed to Effient .   Erectile dysfunction    a. Pt aware not to take ED med within 24 hr of NTG and vice versa.   Hypertension    Ischemic cardiomyopathy    a. 11/2014: EF reportedly 20% by cath and echo in [place].   NSVT (nonsustained ventricular tachycardia) (HCC) 11/16/14   a. 11/2014: admitted after discharge from STEMI admission, 18 beats 135-140bpm in ED.   Pericardial effusion    a. 11/2014.   Pericarditis dx'd 11/2014   Pneumonia 1990's X 2   RBBB    Sleep apnea    suspected but not diagnosed (02/28/2015)   STEMI (ST elevation myocardial infarction) (HCC) 11/10/2014   Tobacco abuse    VF (ventricular fibrillation) (HCC) 11/10/14   a. 11/2014: arrest with STEMI.    Past Surgical History:  Procedure Laterality Date   A-FLUTTER ABLATION N/A 09/04/2020   Procedure: A-FLUTTER ABLATION;  Surgeon: Kelsie Agent, MD;  Location:  [facility];  Service: Cardiovascular;  Laterality: N/A;   ATRIAL FIBRILLATION ABLATION N/A 08/04/2022   Procedure: ATRIAL FIBRILLATION ABLATION;  Surgeon: Cindie Ole DASEN, MD;  Location: [facility];  Service: Cardiovascular;  Laterality: N/A;   CARDIAC CATHETERIZATION  11/19/2014   CARDIAC DEFIBRILLATOR PLACEMENT  02/28/2015   COLONOSCOPY WITH PROPOFOL  N/A 03/16/2023   Procedure: COLONOSCOPY WITH PROPOFOL ;  Surgeon: Federico Rosario BROCKS, MD;  Location: [facility];  Service: Gastroenterology;  Laterality: N/A;   CORONARY ANGIOPLASTY WITH STENT PLACEMENT  11/10/2014   LAD DES   HAND SURGERY Bilateral    Dupuytren's Contracture   IMPLANTABLE CARDIOVERTER DEFIBRILLATOR IMPLANT N/A 02/28/2015   SJM Fortify Assura VR ICD implanted by Dr Kelsie.  Revascularized following VF arrest, EF remained depressed and ICD implanted   KNEE ARTHROSCOPY Right 12/08/1980   LEFT HEART CATH N/A 11/19/2014   Procedure: LEFT HEART CATH;  Surgeon: Dorn JINNY Lesches, MD;  Location: [facility] CATH LAB;  Service: Cardiovascular;  Laterality: N/A;   POLYPECTOMY  03/16/2023   Procedure: POLYPECTOMY;  Surgeon: Federico Rosario BROCKS, MD;  Location: [facility];  Service: Gastroenterology;;   RIGHT/LEFT HEART CATH AND CORONARY ANGIOGRAPHY N/A 07/22/2021   Procedure: RIGHT/LEFT HEART CATH AND CORONARY ANGIOGRAPHY;  Surgeon: Cherrie Toribio SAUNDERS, MD;  Location: [facility];  Service: Cardiovascular;  Laterality: N/A;    Current Outpatient Medications  Medication Sig Dispense Refill   acetaminophen  (TYLENOL ) 500 MG tablet Take 500 mg by mouth every 8 (eight) hours as needed for moderate pain.     atorvastatin  (LIPITOR) 20 MG tablet TAKE 1 TABLET BY MOUTH DAILY 90 tablet 3   cyanocobalamin  (VITAMIN B12) 1000 MCG tablet Take 1,000 mcg by mouth daily.     ELIQUIS  5 MG TABS tablet TAKE 1 TABLET BY MOUTH 2 TIMES A DAY 60 tablet 5   furosemide  (LASIX ) 20 MG tablet TAKE 1 TABLET BY MOUTH DAILY AS NEEDED 30 tablet 2    levothyroxine (SYNTHROID) 112 MCG tablet Take 112 mcg by mouth daily before breakfast.     metoprolol  succinate (TOPROL -XL) 25 MG 24 hr tablet Take 1 tablet (25 mg total) by mouth at bedtime. 90 tablet 3   mexiletine (MEXITIL ) 200 MG capsule Take 1 capsule (200 mg total) by mouth 2 (two) times daily. 60 capsule 6   nerandomilast  (JASCAYD ) 18 MG tablet Take 18 mg by mouth every 12 (twelve) hours. Take with or without food. 60 tablet 5   nitroGLYCERIN  (NITROSTAT ) 0.4 MG SL tablet DISSOLVE 1 TAB UNDER TONGUE FOR CHEST PAIN - IF PAIN REMAINS AFTER 5 MIN, CALL 911 AND REPEAT DOSE. MAX 3 TABS IN 15 MINUTES 25 tablet 3   omega-3 acid ethyl esters (LOVAZA) 1 g capsule Take 2 g by mouth 2 (two) times daily.     sacubitril -valsartan  (ENTRESTO ) 24-26 MG Take 1 tablet by mouth 2 (two) times daily. 180 tablet 3   spironolactone  (ALDACTONE ) 25 MG tablet Take 0.5 tablets (12.5 mg total) by mouth daily. 30 tablet 5   TRELEGY ELLIPTA 100-62.5-25 MCG/ACT AEPB Take 1 puff by mouth daily.     empagliflozin  (JARDIANCE ) 10 MG TABS tablet Take 1 tablet (10 mg total) by mouth daily before breakfast. (Patient not taking: Reported on 01/03/2025) 30 tablet 11   No current facility-administered medications for this encounter.    Allergies:   Ambien  [zolpidem  tartrate], Lisinopril , Brilinta  [ticagrelor ], and Jardiance  [empagliflozin ]   Social History:  The patient  reports that he quit smoking about 10 years ago. His smoking use included cigarettes. He started smoking about 50 years ago. He has a 80 pack-year smoking history. He has never used smokeless tobacco. He reports current alcohol  use of about 14.0 standard drinks of alcohol  per week. He reports that he does not use drugs.   Family History:  The patient's  family history includes COPD in his mother; Crohn's disease in his brother; Heart disease in his mother; Hypertension in  his father and mother; Prostate cancer in his father.   Review of systems complete and found  to be negative unless listed in HPI.     Vitals:   01/03/25 1122  BP: 98/62  Pulse: 64  SpO2: 96%  Weight: 96.1 kg (211 lb 12.8 oz)  Height: 6' 3 (1.905 m)      Wt Readings from Last 3 Encounters:  01/03/25 96.1 kg (211 lb 12.8 oz)  12/13/24 91.6 kg (201 lb 15.8 oz)  11/14/24 96.5 kg (212 lb 12.8 oz)   Physical exam: General:  Sitting up in bed. No resp difficulty HEENT: normal Neck: supple. no JVD.  Cor: Regular rate & rhythm. No rubs, gallops or murmurs. Lungs: clear Abdomen: soft, nontender, nondistended.Good bowel sounds. Extremities: no cyanosis, clubbing, rash, edema Neuro: alert & orientedx3, cranial nerves grossly intact. moves all 4 extremities w/o difficulty. Affect pleasant   Recent Labs: 04/05/2024: B Natriuretic Peptide 778.7 10/28/2024: Magnesium 2.1 11/14/2024: ALT 24 12/13/2024: BUN 18; Creatinine, Ser 1.10; Hemoglobin 15.0; Platelets 283; Potassium 4.6; Pro Brain Natriuretic Peptide 520.0; Sodium 138   Lipid Panel     Component Value Date/Time   CHOL 127 11/07/2021 1212   CHOL 101 12/04/2014 1030   TRIG 170 (H) 11/07/2021 1212   HDL 51 11/07/2021 1212   HDL 43 12/04/2014 1030   CHOLHDL 2.5 11/07/2021 1212   VLDL 34 11/07/2021 1212   LDLCALC 42 11/07/2021 1212   LDLCALC 39 12/04/2014 1030     ASSESSMENT AND PLAN:  1. Chronic systolic due to ischemic CM  - Echo 03/2016 LVEF 20-25%, Grade 2 DD - Echo 3/18 shows with stable LVEF 20-25%. Normal RV.  - Echo 9/18 EF 20-25%, grade 2 DD  - CPX 2018 pVO2 19.6 (69%) slope 37 RER 1.06 FEV1 (68%)  - CPX 7/24: pVO2: 15.0 (57%) slope 44 RER 1.12 - Echo 05/16/20  EF 25% RV normal. - Echo 1/23 EF 25-30%  - Echo 10/24 EF 25-30% RV mild HK - Echo 12/16/24 EF 25% RV ok  - Stable NYHA II-III. Symptoms due to mix of HD and ILD/COPD - Volume status stable on lasix  20 mg PRN - Continue Entresto  12/13 - Continue Toprol  25 - Continue sprio 12.5 mg daily (dose cut back due to hyperkalemia) .   - Off Jardiance  with  yeast infection. Failed 2x - ICD interrogated personally as above - Will refer for BArostim eval   2.  CAD s/p Anterior MI December 2015:  - Cath 8/22 with stable CAD - No angina - Continue DAPT/statin - Lipids per Dr. Loreli.   3. AF/AFL - seen on zio 5% AF burden - Had AF ablation 08/04/22 remains off amio - No recurrent AF on ICD interrogation - reviewed today - Continue Eliquis . No bleeding  4. H/o VT - had SVT/VT in 12/22 - Recurrent VT/VF in 11/25 - follows with EP - Now on mexilitene   5. Tobacco abuse:  - Remains quit  6. Emphysema/ILD - Sees pulmonary. Spirometry on CPX previously reviewed with only mild disease.  - Hi-res CT 5/24 suggestive of ILD but ? Whether related to amio. Amio now off and has been treated with steroids x 4 weeks.  - Repeat Hi-rest CT in 9/24 significant ILD  - Follows with Dr. Geronimo  7. OSA, severe - Obstructive 51/hr - Central 23/hr - Continue CPAP. Followed with Dr. Shlomo Toribio Fuel, MD  11:41 AM

## 2025-01-03 NOTE — Addendum Note (Signed)
 Encounter addended by: Marcelina Lisa HERO, RN on: 01/03/2025 12:20 PM  Actions taken: Order list changed, Clinical Note Signed

## 2025-01-04 ENCOUNTER — Telehealth (HOSPITAL_COMMUNITY): Payer: Self-pay

## 2025-01-04 NOTE — Telephone Encounter (Signed)
Called patient to see if he is interested in the Cardiac Rehab Program. Patient expressed interest. Explained scheduling process and went over insurance, patient verbalized understanding. Will contact patient for scheduling after review

## 2025-01-04 NOTE — Telephone Encounter (Signed)
 Pt insurance is active and benefits verified through [employer] adv Co-pay $25, DED 0/0 met, out of pocket $3,900/$160.39 met, co-insurance 0%. no pre-authorization required, 01/04/2025@3 :54, REF# [id number]   TCR/ICR? ICR Visit(date of service)limitation? No limit Can multiple codes be used on the same date of service/visit?(IF ITS A LIMIT) yes    Is this a lifetime maximum or an annual maximum? annual Has the member used any of these services to date? no Is there a time limit (weeks/months) on start of program and/or program completion? no   Will contact patient to see if he is interested in the Cardiac Rehab Program. If interested, patient will need to complete follow up appt. Once completed, patient will be contacted for scheduling upon review by the RN Navigator.

## 2025-01-24 ENCOUNTER — Ambulatory Visit: Admitting: Pulmonary Disease

## 2025-03-23 ENCOUNTER — Ambulatory Visit (HOSPITAL_COMMUNITY): Admitting: Internal Medicine
